# Patient Record
Sex: Female | Born: 1945
Health system: Southern US, Community
[De-identification: ages and names within clinical notes are randomized; demographics above are authoritative.]

## PROBLEM LIST (undated history)

## (undated) DIAGNOSIS — E2839 Other primary ovarian failure: Secondary | ICD-10-CM

## (undated) DIAGNOSIS — G4730 Sleep apnea, unspecified: Secondary | ICD-10-CM

## (undated) DIAGNOSIS — C50919 Malignant neoplasm of unspecified site of unspecified female breast: Secondary | ICD-10-CM

## (undated) DIAGNOSIS — M810 Age-related osteoporosis without current pathological fracture: Secondary | ICD-10-CM

## (undated) DIAGNOSIS — D249 Benign neoplasm of unspecified breast: Secondary | ICD-10-CM

## (undated) DIAGNOSIS — R609 Edema, unspecified: Secondary | ICD-10-CM

## (undated) DIAGNOSIS — G622 Polyneuropathy due to other toxic agents: Secondary | ICD-10-CM

## (undated) DIAGNOSIS — R0602 Shortness of breath: Secondary | ICD-10-CM

## (undated) DIAGNOSIS — N184 Chronic kidney disease, stage 4 (severe): Secondary | ICD-10-CM

## (undated) DIAGNOSIS — D649 Anemia, unspecified: Secondary | ICD-10-CM

## (undated) DIAGNOSIS — E119 Type 2 diabetes mellitus without complications: Secondary | ICD-10-CM

## (undated) DIAGNOSIS — N200 Calculus of kidney: Secondary | ICD-10-CM

## (undated) DIAGNOSIS — M1990 Unspecified osteoarthritis, unspecified site: Secondary | ICD-10-CM

## (undated) DIAGNOSIS — Z923 Personal history of irradiation: Secondary | ICD-10-CM

## (undated) DIAGNOSIS — I10 Essential (primary) hypertension: Secondary | ICD-10-CM

## (undated) DIAGNOSIS — G4710 Hypersomnia, unspecified: Secondary | ICD-10-CM

## (undated) DIAGNOSIS — R079 Chest pain, unspecified: Secondary | ICD-10-CM

## (undated) DIAGNOSIS — E785 Hyperlipidemia, unspecified: Secondary | ICD-10-CM

## (undated) DIAGNOSIS — N189 Chronic kidney disease, unspecified: Secondary | ICD-10-CM

## (undated) DIAGNOSIS — E11319 Type 2 diabetes mellitus with unspecified diabetic retinopathy without macular edema: Secondary | ICD-10-CM

## (undated) DIAGNOSIS — R0600 Dyspnea, unspecified: Secondary | ICD-10-CM

## (undated) DIAGNOSIS — G629 Polyneuropathy, unspecified: Secondary | ICD-10-CM

## (undated) DIAGNOSIS — G619 Inflammatory polyneuropathy, unspecified: Secondary | ICD-10-CM

## (undated) DIAGNOSIS — M545 Low back pain, unspecified: Secondary | ICD-10-CM

## (undated) DIAGNOSIS — Z87442 Personal history of urinary calculi: Secondary | ICD-10-CM

## (undated) DIAGNOSIS — M129 Arthropathy, unspecified: Secondary | ICD-10-CM

## (undated) HISTORY — DX: Edema, unspecified: R60.9

## (undated) HISTORY — DX: Arthropathy, unspecified: M12.9

## (undated) HISTORY — DX: Other primary ovarian failure: E28.39

## (undated) HISTORY — DX: Hypersomnia, unspecified: G47.30

## (undated) HISTORY — DX: Inflammatory polyneuropathy, unspecified: G62.2

## (undated) HISTORY — DX: Shortness of breath: R06.02

## (undated) HISTORY — PX: LAPAROSCOPIC OOPHERECTOMY: SHX6507

## (undated) HISTORY — DX: Low back pain, unspecified: M54.50

## (undated) HISTORY — DX: Age-related osteoporosis without current pathological fracture: M81.0

## (undated) HISTORY — DX: Calculus of kidney: N20.0

## (undated) HISTORY — DX: Type 2 diabetes mellitus without complications: E11.9

## (undated) HISTORY — DX: Essential (primary) hypertension: I10

## (undated) HISTORY — DX: Low back pain: M54.5

## (undated) HISTORY — DX: Benign neoplasm of unspecified breast: D24.9

## (undated) HISTORY — DX: Inflammatory polyneuropathy, unspecified: G61.9

## (undated) HISTORY — DX: Type 2 diabetes mellitus with unspecified diabetic retinopathy without macular edema: E11.319

## (undated) HISTORY — DX: Hyperlipidemia, unspecified: E78.5

## (undated) HISTORY — PX: CATARACT EXTRACTION: SUR2

## (undated) HISTORY — DX: Unspecified osteoarthritis, unspecified site: M19.90

## (undated) HISTORY — PX: CHOLECYSTECTOMY: SHX55

## (undated) HISTORY — DX: Hypersomnia, unspecified: G47.10

## (undated) HISTORY — PX: TUBAL LIGATION: SHX77

## (undated) HISTORY — PX: EYE SURGERY: SHX253

## (undated) HISTORY — DX: Sleep apnea, unspecified: G47.10

## (undated) HISTORY — PX: BREAST DUCTAL SYSTEM EXCISION: SHX5242

## (undated) HISTORY — PX: TONSILLECTOMY: SUR1361

## (undated) HISTORY — PX: BILATERAL SALPINGECTOMY: SHX5743

## (undated) HISTORY — DX: Chest pain, unspecified: R07.9

---

## 2004-03-08 ENCOUNTER — Ambulatory Visit: Payer: Self-pay

## 2004-11-23 ENCOUNTER — Ambulatory Visit: Payer: Self-pay | Admitting: Internal Medicine

## 2004-12-21 ENCOUNTER — Ambulatory Visit: Payer: Self-pay | Admitting: Internal Medicine

## 2005-01-29 ENCOUNTER — Ambulatory Visit: Payer: Self-pay | Admitting: Internal Medicine

## 2005-02-20 ENCOUNTER — Ambulatory Visit: Payer: Self-pay | Admitting: Internal Medicine

## 2006-01-02 ENCOUNTER — Ambulatory Visit: Payer: Self-pay | Admitting: Internal Medicine

## 2007-04-28 ENCOUNTER — Ambulatory Visit: Payer: Self-pay | Admitting: Gastroenterology

## 2007-10-22 ENCOUNTER — Ambulatory Visit: Payer: Self-pay | Admitting: Internal Medicine

## 2007-11-04 ENCOUNTER — Ambulatory Visit: Payer: Self-pay | Admitting: Internal Medicine

## 2008-07-01 ENCOUNTER — Ambulatory Visit: Payer: Self-pay | Admitting: Otolaryngology

## 2009-02-24 ENCOUNTER — Ambulatory Visit: Payer: Self-pay | Admitting: Internal Medicine

## 2010-03-14 ENCOUNTER — Ambulatory Visit: Payer: Self-pay | Admitting: Internal Medicine

## 2010-05-07 ENCOUNTER — Ambulatory Visit: Payer: Self-pay | Admitting: Ophthalmology

## 2010-05-14 ENCOUNTER — Ambulatory Visit: Payer: Self-pay | Admitting: Ophthalmology

## 2010-06-04 ENCOUNTER — Ambulatory Visit: Payer: Self-pay | Admitting: Ophthalmology

## 2010-10-29 ENCOUNTER — Ambulatory Visit: Payer: Self-pay | Admitting: Internal Medicine

## 2011-05-15 ENCOUNTER — Ambulatory Visit: Payer: Self-pay | Admitting: Internal Medicine

## 2011-07-23 ENCOUNTER — Ambulatory Visit: Payer: Self-pay | Admitting: Surgery

## 2011-07-23 LAB — CBC WITH DIFFERENTIAL/PLATELET
Basophil %: 0.3 %
Eosinophil #: 0.2 10*3/uL (ref 0.0–0.7)
Eosinophil %: 3.6 %
HGB: 12.2 g/dL (ref 12.0–16.0)
Lymphocyte %: 38 %
MCHC: 32.9 g/dL (ref 32.0–36.0)
MCV: 89 fL (ref 80–100)
Monocyte %: 8.7 %
Neutrophil #: 3.4 10*3/uL (ref 1.4–6.5)
Platelet: 163 10*3/uL (ref 150–440)
RBC: 4.14 10*6/uL (ref 3.80–5.20)

## 2011-07-23 LAB — COMPREHENSIVE METABOLIC PANEL
Anion Gap: 5 — ABNORMAL LOW (ref 7–16)
BUN: 20 mg/dL — ABNORMAL HIGH (ref 7–18)
Bilirubin,Total: 0.4 mg/dL (ref 0.2–1.0)
Calcium, Total: 10 mg/dL (ref 8.5–10.1)
Chloride: 101 mmol/L (ref 98–107)
Co2: 33 mmol/L — ABNORMAL HIGH (ref 21–32)
Glucose: 126 mg/dL — ABNORMAL HIGH (ref 65–99)
Osmolality: 282 (ref 275–301)
Potassium: 4.5 mmol/L (ref 3.5–5.1)
Sodium: 139 mmol/L (ref 136–145)
Total Protein: 7.7 g/dL (ref 6.4–8.2)

## 2011-07-30 ENCOUNTER — Ambulatory Visit: Payer: Self-pay | Admitting: Surgery

## 2012-04-22 HISTORY — PX: BREAST EXCISIONAL BIOPSY: SUR124

## 2012-04-22 HISTORY — PX: BREAST BIOPSY: SHX20

## 2012-07-22 ENCOUNTER — Ambulatory Visit: Payer: Self-pay

## 2012-08-26 ENCOUNTER — Ambulatory Visit: Payer: Self-pay

## 2012-10-21 ENCOUNTER — Ambulatory Visit: Payer: Self-pay | Admitting: Gastroenterology

## 2012-11-11 ENCOUNTER — Ambulatory Visit: Payer: Self-pay

## 2013-02-11 ENCOUNTER — Ambulatory Visit: Payer: Self-pay

## 2013-02-17 ENCOUNTER — Ambulatory Visit: Payer: Self-pay

## 2013-02-22 ENCOUNTER — Ambulatory Visit: Payer: Self-pay

## 2013-03-08 ENCOUNTER — Ambulatory Visit: Payer: Self-pay | Admitting: Surgery

## 2013-03-08 DIAGNOSIS — I10 Essential (primary) hypertension: Secondary | ICD-10-CM

## 2013-03-08 LAB — BASIC METABOLIC PANEL
Anion Gap: 5 — ABNORMAL LOW (ref 7–16)
Calcium, Total: 9.7 mg/dL (ref 8.5–10.1)
Chloride: 106 mmol/L (ref 98–107)
Creatinine: 1.47 mg/dL — ABNORMAL HIGH (ref 0.60–1.30)
EGFR (Non-African Amer.): 37 — ABNORMAL LOW
Glucose: 170 mg/dL — ABNORMAL HIGH (ref 65–99)
Osmolality: 290 (ref 275–301)
Potassium: 4.1 mmol/L (ref 3.5–5.1)

## 2013-03-08 LAB — CBC
HGB: 11.6 g/dL — ABNORMAL LOW (ref 12.0–16.0)
MCV: 87 fL (ref 80–100)
RBC: 4.01 10*6/uL (ref 3.80–5.20)
RDW: 16.7 % — ABNORMAL HIGH (ref 11.5–14.5)

## 2013-03-15 ENCOUNTER — Ambulatory Visit: Payer: Self-pay | Admitting: Surgery

## 2013-03-24 LAB — PATHOLOGY REPORT

## 2013-07-23 ENCOUNTER — Ambulatory Visit: Payer: Self-pay | Admitting: Surgery

## 2014-04-25 DIAGNOSIS — E11329 Type 2 diabetes mellitus with mild nonproliferative diabetic retinopathy without macular edema: Secondary | ICD-10-CM | POA: Diagnosis not present

## 2014-07-18 DIAGNOSIS — E559 Vitamin D deficiency, unspecified: Secondary | ICD-10-CM | POA: Diagnosis not present

## 2014-07-18 DIAGNOSIS — I10 Essential (primary) hypertension: Secondary | ICD-10-CM | POA: Diagnosis not present

## 2014-07-18 DIAGNOSIS — E782 Mixed hyperlipidemia: Secondary | ICD-10-CM | POA: Diagnosis not present

## 2014-07-18 DIAGNOSIS — Z0001 Encounter for general adult medical examination with abnormal findings: Secondary | ICD-10-CM | POA: Diagnosis not present

## 2014-07-18 DIAGNOSIS — E1140 Type 2 diabetes mellitus with diabetic neuropathy, unspecified: Secondary | ICD-10-CM | POA: Diagnosis not present

## 2014-07-18 DIAGNOSIS — E1165 Type 2 diabetes mellitus with hyperglycemia: Secondary | ICD-10-CM | POA: Diagnosis not present

## 2014-08-01 ENCOUNTER — Ambulatory Visit
Admit: 2014-08-01 | Disposition: A | Discharge status: Home / Self Care | Payer: Self-pay | Attending: Nurse Practitioner | Admitting: Nurse Practitioner

## 2014-08-01 DIAGNOSIS — M8580 Other specified disorders of bone density and structure, unspecified site: Secondary | ICD-10-CM | POA: Diagnosis not present

## 2014-08-01 DIAGNOSIS — Z1231 Encounter for screening mammogram for malignant neoplasm of breast: Secondary | ICD-10-CM | POA: Diagnosis not present

## 2014-08-01 DIAGNOSIS — M8589 Other specified disorders of bone density and structure, multiple sites: Secondary | ICD-10-CM | POA: Diagnosis not present

## 2014-08-01 DIAGNOSIS — E2839 Other primary ovarian failure: Secondary | ICD-10-CM | POA: Diagnosis not present

## 2014-08-04 DIAGNOSIS — E1165 Type 2 diabetes mellitus with hyperglycemia: Secondary | ICD-10-CM | POA: Diagnosis not present

## 2014-08-04 DIAGNOSIS — E782 Mixed hyperlipidemia: Secondary | ICD-10-CM | POA: Diagnosis not present

## 2014-08-04 DIAGNOSIS — Z6837 Body mass index (BMI) 37.0-37.9, adult: Secondary | ICD-10-CM | POA: Diagnosis not present

## 2014-08-04 DIAGNOSIS — I10 Essential (primary) hypertension: Secondary | ICD-10-CM | POA: Diagnosis not present

## 2014-08-12 NOTE — Op Note (Signed)
PATIENT NAME:  Kara [name], SEDGWICK MR#:  [id number] DATE OF BIRTH:  [birth year]  DATE OF PROCEDURE:  03/15/2013  PREOPERATIVE DIAGNOSIS: Two right breast masses.   POSTOPERATIVE DIAGNOSIS: Two right breast masses.   PROCEDURE: Excision of 2 right breast masses.   SURGEON: Rochel Brome, MD  ANESTHESIA: General.   INDICATIONS: This 69 year old female has a history of discharge from the right nipple dating back to July. She has had ductal gland demonstrating a small nodule about 10 mm lateral to the nipple and about 12 mm deep. She also had a cluster of calcifications in the lateral aspect of the right breast deeper in the tissue. She had stereotactic biopsy, finding of papilloma with fibrocystic changes and calcifications. She was advised to have preoperative x-ray needle localization and excision of the deeper mass. I also recommended insertion of lacrimal duct probe to identify the duct and excise the mass which was more superficial.   The patient did have preoperative insertion of Kopans wire using radiographic guidance. I reviewed these mammogram images demonstrating location of the Kopans wire and the biopsy site.   DESCRIPTION OF PROCEDURE: The patient was placed on the operating table in the supine position under general anesthesia. The dressing was removed from the right breast exposing the Kopans wire, which were entered the breast at the 9 o'clock position some 4 cm lateral to the border of the areola. This wire was cut 2 cm from the skin. The right breast was prepared with ChloraPrep, draped in a sterile manner.   A curvilinear incision was made from approximately 8 o'clock to 10 o'clock position about a centimeter lateral to the border of the areola and carried down through subcutaneous tissues. Next, the nipple was squeezed demonstrating drainage from the central aspect of the nipple. A lacrimal duct probe was inserted and went into a duct at the 9 o'clock position. The dissection was carried  down to the lacrimal duct probe and removed tissue surrounding the probe over a distance of about 2 cm. It was submitted fresh for routine pathology and labeled as superficial nodule at 9 o'clock position.   Further dissection was carried out down to the Kopans wire and removed a portion of tissue surrounding the thick portion of the wire. There were 2 arterial bleeding points which were suture ligated with 4-0 chromic and resolved. This portion of tissue was approximately 2 x 2 x 2 cm in dimension surrounding the thick portion of the wire and was submitted for specimen mammogram and pathology, labeled as the deeper nodule at 9 o'clock position.   The wound was inspected. Several small bleeding points were cauterized. Hemostasis was subsequently intact. Deeper tissues surrounding cautery artifact were infiltrated with 0.5% Sensorcaine with epinephrine. Also subcutaneous tissues were infiltrated using a total of 7 mL. Next, the subcutaneous tissues were closed with interrupted 4-0 chromic. The skin was closed with running 4-0 Monocryl subcuticular suture and Dermabond. The patient tolerated the surgery satisfactorily and was then prepared for transfer to the recovery room. ____________________________ Lenna Sciara. Rochel Brome, MD jws:sb D: 03/15/2013 11:24:45 ET T: 03/15/2013 11:41:08 ET JOB#: [id number]  cc: Loreli Dollar, MD, <Dictator> Loreli Dollar MD ELECTRONICALLY SIGNED 03/16/2013 18:49

## 2014-08-14 NOTE — Op Note (Signed)
PATIENT NAME:  Kara [name], Kara [name] MR#:  [id number] DATE OF BIRTH:  04/30/1945  DATE OF PROCEDURE:  07/30/2011  PREOPERATIVE DIAGNOSIS: Chronic cholecystitis, cholelithiasis.   POSTOPERATIVE DIAGNOSIS: Chronic cholecystitis, cholelithiasis.   PROCEDURE: Laparoscopic cholecystectomy, cholangiogram.   SURGEON: Loreli Dollar, MD   ANESTHESIA: General.   INDICATIONS: This 69 year old female has a history of intermittent right upper quadrant abdominal pains radiating through to the subscapular region of the back, duration of some 5 to 6 months. She had ultrasound findings of gallstones, also has diabetes and morbid obesity. Had previous lower abdominal laparotomy to remove her tubes and one ovary.   PROCEDURE: The patient was placed on the operating table in the supine position under general endotracheal anesthesia. The abdomen was prepared with ChloraPrep and draped in a sterile manner.   Initial palpation revealed there was firmness at the umbilicus consistent with incarcerated umbilical hernia which appeared to contain fatty tissue. The incision was made in the supraumbilical area, longitudinally oriented 12 mm in length, carried down through subcutaneous tissues to the deep fascia. The fascia was grasped with a laryngeal hook and elevated. A Veress needle was inserted, aspirated, and irrigated with a saline solution. Next, the peritoneal cavity was inflated with carbon dioxide. The Veress needle was removed. The 10 mm cannula was inserted. The 10 mm 0 degree laparoscope was inserted to view the peritoneal cavity. The patient was placed in reverse Trendelenburg position and turned 5 degrees to the left. The liver appeared normal. Another incision was made in the epigastrium right of the midline to introduce an 11 mm cannula. Two incisions were made in the lateral aspect of the right upper quadrant to introduce two 5-mm cannulas.   The gallbladder was retracted towards the right shoulder. It appeared to  have a slightly thickened wall. The infundibulum was retracted inferiorly and laterally. The porta hepatis was demonstrated. The neck of the gallbladder was mobilized with incision of the visceral peritoneum. The cystic duct was dissected free from surrounding structures. The cystic artery was dissected free from surrounding structures. A critical view of safety was demonstrated. An Endoclip was placed across the cystic duct adjacent to the neck of the gallbladder. An incision was made in the cystic duct to introduce a Reddick catheter. Half-strength Conray-60 dye was injected. The cholangiogram was done with fluoroscopy viewing the biliary tree and prompt flow of dye into the duodenum. No retained calculi were seen. Three images were saved. The Reddick catheter was removed. The cystic duct was doubly ligated with endoclips and divided. The cystic artery was controlled with a single Endoclip and divided. The gallbladder was dissected free from the liver with hook and cautery. Bleeding was very scant. Hemostasis was subsequently intact. Gallbladder was delivered up through the infraumbilical incision, opened, suctioned, removed, and submitted in formalin with palpable stones for routine pathology. It is noted that there was evidence of some omentum incarcerated in a small umbilical hernia. There was some bleeding from the supraumbilical port site which is controlled using 0 Vicryl simple suture placing this in with an Endoclose instrument and tying it and voiding resolved. A small amount of blood was aspirated. Hemostasis was subsequently intact. The right upper quadrant was further inspected. Hemostasis was intact. The cannulas were removed. Carbon dioxide was allowed to escape from the peritoneal cavity. Skin incisions were infiltrated with 0.5% Sensorcaine with epinephrine. The supraumbilical incision was closed with interrupted 4-0 chromic subcuticular sutures. The other three incisions were closed with  interrupted 5-0 chromic subcuticular  sutures. All dressings were further reinforced with benzoin, Steri-Strips, and dressed with cotton gauze and paper tape.   The patient tolerated surgery satisfactorily and was then prepared for transfer to the recovery room.   ____________________________ Lenna Sciara. Rochel Brome, MD jws:drc D: 07/30/2011 09:01:40 ET T: 07/30/2011 10:28:00 ET JOB#: [id number]  cc: Loreli Dollar, MD, <Dictator> Loreli Dollar MD ELECTRONICALLY SIGNED 07/30/2011 18:15

## 2014-08-25 DIAGNOSIS — E1122 Type 2 diabetes mellitus with diabetic chronic kidney disease: Secondary | ICD-10-CM | POA: Diagnosis not present

## 2014-09-06 DIAGNOSIS — E1140 Type 2 diabetes mellitus with diabetic neuropathy, unspecified: Secondary | ICD-10-CM | POA: Diagnosis not present

## 2014-09-06 DIAGNOSIS — N200 Calculus of kidney: Secondary | ICD-10-CM | POA: Diagnosis not present

## 2014-09-06 DIAGNOSIS — R944 Abnormal results of kidney function studies: Secondary | ICD-10-CM | POA: Diagnosis not present

## 2014-09-06 DIAGNOSIS — I10 Essential (primary) hypertension: Secondary | ICD-10-CM | POA: Diagnosis not present

## 2014-09-06 DIAGNOSIS — R609 Edema, unspecified: Secondary | ICD-10-CM | POA: Diagnosis not present

## 2014-09-20 ENCOUNTER — Ambulatory Visit
Admission: RE | Admit: 2014-09-20 | Discharge: 2014-09-20 | Disposition: A | Discharge status: Home / Self Care | Payer: Commercial Managed Care - HMO | Source: Ambulatory Visit | Attending: Urology | Admitting: Urology

## 2014-09-20 ENCOUNTER — Encounter
Admission: RE | Admit: 2014-09-20 | Discharge: 2014-09-20 | Disposition: A | Discharge status: Home / Self Care | Payer: Commercial Managed Care - HMO | Source: Ambulatory Visit | Attending: Urology | Admitting: Urology

## 2014-09-20 ENCOUNTER — Other Ambulatory Visit: Payer: Self-pay | Admitting: Urology

## 2014-09-20 DIAGNOSIS — N200 Calculus of kidney: Secondary | ICD-10-CM | POA: Diagnosis not present

## 2014-09-20 DIAGNOSIS — N209 Urinary calculus, unspecified: Secondary | ICD-10-CM | POA: Diagnosis not present

## 2014-09-20 DIAGNOSIS — I10 Essential (primary) hypertension: Secondary | ICD-10-CM | POA: Diagnosis not present

## 2014-09-29 ENCOUNTER — Telehealth: Payer: Self-pay | Admitting: Urology

## 2014-09-29 ENCOUNTER — Encounter
Admission: RE | Disposition: A | Discharge status: Home / Self Care | Payer: Self-pay | Source: Ambulatory Visit | Attending: Urology

## 2014-09-29 ENCOUNTER — Ambulatory Visit: Payer: Commercial Managed Care - HMO

## 2014-09-29 ENCOUNTER — Ambulatory Visit
Admission: RE | Admit: 2014-09-29 | Discharge: 2014-09-29 | Disposition: A | Discharge status: Home / Self Care | Payer: Commercial Managed Care - HMO | Source: Ambulatory Visit | Attending: Urology | Admitting: Urology

## 2014-09-29 DIAGNOSIS — N200 Calculus of kidney: Secondary | ICD-10-CM | POA: Diagnosis not present

## 2014-09-29 DIAGNOSIS — E119 Type 2 diabetes mellitus without complications: Secondary | ICD-10-CM | POA: Insufficient documentation

## 2014-09-29 DIAGNOSIS — I10 Essential (primary) hypertension: Secondary | ICD-10-CM | POA: Diagnosis not present

## 2014-09-29 DIAGNOSIS — E669 Obesity, unspecified: Secondary | ICD-10-CM | POA: Insufficient documentation

## 2014-09-29 DIAGNOSIS — N201 Calculus of ureter: Secondary | ICD-10-CM | POA: Diagnosis not present

## 2014-09-29 HISTORY — PX: EXTRACORPOREAL SHOCK WAVE LITHOTRIPSY: SHX1557

## 2014-09-29 LAB — GLUCOSE, CAPILLARY: Glucose-Capillary: 158 mg/dL — ABNORMAL HIGH (ref 65–99)

## 2014-09-29 SURGERY — LITHOTRIPSY, ESWL
Anesthesia: Moderate Sedation | Laterality: Left

## 2014-09-29 MED ORDER — DIAZEPAM 5 MG PO TABS
10.0000 mg | ORAL_TABLET | Freq: Once | ORAL | Status: AC
  Administered 2014-09-29: 10 mg via ORAL

## 2014-09-29 MED ORDER — DIAZEPAM 5 MG PO TABS
ORAL_TABLET | ORAL | Status: AC
  Administered 2014-09-29: 10 mg via ORAL
  Filled 2014-09-29: qty 2

## 2014-09-29 MED ORDER — ONDANSETRON HCL 4 MG/2ML IJ SOLN
INTRAMUSCULAR | Status: AC
  Administered 2014-09-29: 4 mg via INTRAVENOUS
  Filled 2014-09-29: qty 2

## 2014-09-29 MED ORDER — DEXTROSE-NACL 5-0.45 % IV SOLN
INTRAVENOUS | Status: DC
  Administered 2014-09-29: 07:00:00 via INTRAVENOUS

## 2014-09-29 MED ORDER — CIPROFLOXACIN HCL 500 MG PO TABS
500.0000 mg | ORAL_TABLET | Freq: Once | ORAL | Status: AC
  Administered 2014-09-29: 500 mg via ORAL

## 2014-09-29 MED ORDER — DIPHENHYDRAMINE HCL 25 MG PO CAPS
25.0000 mg | ORAL_CAPSULE | ORAL | Status: AC
  Administered 2014-09-29: 25 mg via ORAL

## 2014-09-29 MED ORDER — DIPHENHYDRAMINE HCL 25 MG PO CAPS
ORAL_CAPSULE | ORAL | Status: AC
  Administered 2014-09-29: 25 mg via ORAL
  Filled 2014-09-29: qty 1

## 2014-09-29 MED ORDER — ONDANSETRON HCL 4 MG/2ML IJ SOLN
4.0000 mg | Freq: Once | INTRAMUSCULAR | Status: AC
Start: 2014-09-29 — End: 2014-09-29
  Administered 2014-09-29: 4 mg via INTRAVENOUS

## 2014-09-29 MED ORDER — CIPROFLOXACIN HCL 500 MG PO TABS
ORAL_TABLET | ORAL | Status: AC
  Administered 2014-09-29: 500 mg via ORAL
  Filled 2014-09-29: qty 1

## 2014-09-29 NOTE — H&P (Signed)
See paper H&P 

## 2014-09-29 NOTE — Telephone Encounter (Signed)
Patient was scheduled for ESWL this morning/SW

## 2014-09-29 NOTE — Telephone Encounter (Signed)
Patient had a KUB and no stone was visible.  Did she has ESWL?

## 2014-09-29 NOTE — Discharge Instructions (Signed)
No procedure done

## 2014-11-14 DIAGNOSIS — E782 Mixed hyperlipidemia: Secondary | ICD-10-CM | POA: Diagnosis not present

## 2014-11-14 DIAGNOSIS — E1165 Type 2 diabetes mellitus with hyperglycemia: Secondary | ICD-10-CM | POA: Diagnosis not present

## 2014-11-14 DIAGNOSIS — R609 Edema, unspecified: Secondary | ICD-10-CM | POA: Diagnosis not present

## 2014-11-14 DIAGNOSIS — N200 Calculus of kidney: Secondary | ICD-10-CM | POA: Diagnosis not present

## 2014-11-14 DIAGNOSIS — I10 Essential (primary) hypertension: Secondary | ICD-10-CM | POA: Diagnosis not present

## 2014-12-06 DIAGNOSIS — R944 Abnormal results of kidney function studies: Secondary | ICD-10-CM | POA: Diagnosis not present

## 2014-12-30 ENCOUNTER — Encounter: Payer: Self-pay | Admitting: Urology

## 2015-02-14 DIAGNOSIS — E782 Mixed hyperlipidemia: Secondary | ICD-10-CM | POA: Diagnosis not present

## 2015-02-14 DIAGNOSIS — E1140 Type 2 diabetes mellitus with diabetic neuropathy, unspecified: Secondary | ICD-10-CM | POA: Diagnosis not present

## 2015-02-14 DIAGNOSIS — R944 Abnormal results of kidney function studies: Secondary | ICD-10-CM | POA: Diagnosis not present

## 2015-02-14 DIAGNOSIS — M545 Low back pain: Secondary | ICD-10-CM | POA: Diagnosis not present

## 2015-02-14 DIAGNOSIS — I10 Essential (primary) hypertension: Secondary | ICD-10-CM | POA: Diagnosis not present

## 2015-03-30 DIAGNOSIS — E119 Type 2 diabetes mellitus without complications: Secondary | ICD-10-CM | POA: Diagnosis not present

## 2015-03-30 DIAGNOSIS — R944 Abnormal results of kidney function studies: Secondary | ICD-10-CM | POA: Diagnosis not present

## 2015-04-07 ENCOUNTER — Ambulatory Visit
Admission: RE | Admit: 2015-04-07 | Discharge: 2015-04-07 | Disposition: A | Discharge status: Home / Self Care | Payer: Commercial Managed Care - HMO | Source: Ambulatory Visit | Attending: Urology | Admitting: Urology

## 2015-04-07 ENCOUNTER — Encounter: Payer: Self-pay | Admitting: Urology

## 2015-04-07 ENCOUNTER — Ambulatory Visit (INDEPENDENT_AMBULATORY_CARE_PROVIDER_SITE_OTHER): Payer: Commercial Managed Care - HMO | Admitting: Urology

## 2015-04-07 VITALS — BP 186/86 | HR 102 | Temp 98.7°F | Ht 64.0 in | Wt 221.6 lb

## 2015-04-07 DIAGNOSIS — K5732 Diverticulitis of large intestine without perforation or abscess without bleeding: Secondary | ICD-10-CM | POA: Insufficient documentation

## 2015-04-07 DIAGNOSIS — Z87442 Personal history of urinary calculi: Secondary | ICD-10-CM | POA: Insufficient documentation

## 2015-04-07 DIAGNOSIS — R1032 Left lower quadrant pain: Secondary | ICD-10-CM | POA: Insufficient documentation

## 2015-04-07 DIAGNOSIS — R918 Other nonspecific abnormal finding of lung field: Secondary | ICD-10-CM | POA: Insufficient documentation

## 2015-04-07 DIAGNOSIS — N200 Calculus of kidney: Secondary | ICD-10-CM | POA: Diagnosis not present

## 2015-04-07 DIAGNOSIS — N202 Calculus of kidney with calculus of ureter: Secondary | ICD-10-CM | POA: Insufficient documentation

## 2015-04-07 DIAGNOSIS — N132 Hydronephrosis with renal and ureteral calculous obstruction: Secondary | ICD-10-CM | POA: Insufficient documentation

## 2015-04-07 LAB — URINALYSIS, COMPLETE
BILIRUBIN UA: NEGATIVE
Glucose, UA: NEGATIVE
Ketones, UA: NEGATIVE
Nitrite, UA: NEGATIVE
PH UA: 5.5 (ref 5.0–7.5)
Specific Gravity, UA: 1.015 (ref 1.005–1.030)
Urobilinogen, Ur: 0.2 mg/dL (ref 0.2–1.0)

## 2015-04-07 LAB — MICROSCOPIC EXAMINATION

## 2015-04-07 MED ORDER — TAMSULOSIN HCL 0.4 MG PO CAPS: 0.4000 mg | ORAL_CAPSULE | Freq: Every day | ORAL | Status: DC

## 2015-04-07 MED ORDER — HYDROCODONE-ACETAMINOPHEN 5-325 MG PO TABS: 1.0000 | ORAL_TABLET | Freq: Four times a day (QID) | ORAL | Status: DC | PRN

## 2015-04-07 NOTE — Progress Notes (Signed)
04/07/2015 3:52 PM   Kara [name] 1946-02-02 [id number]  Referring provider: Lavera Guise, MD [address] [place], [place] [address]  Chief Complaint  Patient presents with  . Nephrolithiasis    HPI: 69 yo with history of nephrolithiasis who presents today to the office with onset of LLQ pain x 3 days, nausea, vomiting.  She is concerned that she may be passing another stone. The pain comes and goes.  +chills but no fevers.   No dysuria or gross hematuria.    She has only passed on other stone just this past spring.   She was scheduled for ESWL by Dr. Elnoria Howard, however, the stone was unable to be visualized at that time and she presumably passed it spontaneously.  Prior to 3 days ago, she did not have any flank pain or any other symptoms consistent with stones.   PMH: Past Medical History  Diagnosis Date  . Lumbago   . Hyperlipemia   . Osteoarthrosis   . SOB (shortness of breath)   . Cough variant asthma   . Edema   . Ovarian failure   . Hypersomnia with sleep apnea   . 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 Arthropathy   . Diabetic retinopathy ([place])   . Hypertension   . Chest pain   . Benign neoplasm of breast   . Diabetes ([place])   . Nephrolithiasis   . Kidney stones, calcium oxalate     Surgical History: Past Surgical History  Procedure Laterality Date  . Extracorporeal shock wave lithotripsy Left 09/29/2014    Procedure: EXTRACORPOREAL SHOCK WAVE LITHOTRIPSY (ESWL);  Surgeon: Hollice Espy, MD;  Location: [facility] ORS;  Service: Urology;  Laterality: Left;  . Laparoscopic oopherectomy Left   . Tonsillectomy    . Cholecystectomy    . Cataract extraction    . Breast ductal system excision      Home Medications:    Medication List       This list is accurate as of: 04/07/15  3:52 PM.  Always use your most recent med list.               aspirin EC 81 MG tablet  Take 81 mg by mouth daily. Reported on 04/07/2015     calcium citrate-vitamin D  315-200 MG-UNIT tablet  Commonly known as:  CITRACAL+D  Take 1 tablet by mouth daily.     cetirizine 10 MG tablet  Commonly known as:  ZYRTEC  Take 10 mg by mouth as needed for allergies.     cholecalciferol 1000 UNITS tablet  Commonly known as:  VITAMIN D  Take 1,000 Units by mouth daily.     furosemide 10 MG/ML solution  Commonly known as:  LASIX  Take 10 mg by mouth daily as needed.     glimepiride 4 MG tablet  Commonly known as:  AMARYL  Take 4 mg by mouth 2 (two) times daily.     hydrALAZINE 10 MG tablet  Commonly known as:  APRESOLINE  Take 10 mg by mouth 3 (three) times daily.     HYDROcodone-acetaminophen 5-325 MG tablet  Commonly known as:  NORCO/VICODIN  Take 1-2 tablets by mouth every 6 (six) hours as needed for moderate pain.     insulin detemir 100 UNIT/ML injection  Commonly known as:  LEVEMIR  Inject 75 Units into the skin daily after supper.     loperamide 2 MG tablet  Commonly known as:  IMODIUM A-D  Take 2 mg  by mouth as needed for diarrhea or loose stools.     losartan 25 MG tablet  Commonly known as:  COZAAR  Take 25 mg by mouth daily.     pregabalin 100 MG capsule  Commonly known as:  LYRICA  Take 100 mg by mouth 2 (two) times daily.     rosuvastatin 20 MG tablet  Commonly known as:  CRESTOR  Take 20 mg by mouth daily.     tamsulosin 0.4 MG Caps capsule  Commonly known as:  FLOMAX  Take 1 capsule (0.4 mg total) by mouth daily.     traMADol-acetaminophen 37.5-325 MG tablet  Commonly known as:  ULTRACET  Take 1 tablet by mouth every evening.     verapamil 40 MG tablet  Commonly known as:  CALAN  Take 40 mg by mouth 2 (two) times daily.        Allergies: No Known Allergies  Family History: Family History  Problem Relation Age of Onset  . Prostate cancer Neg Hx   . Bladder Cancer Neg Hx   . Kidney cancer Neg Hx     Social History:  reports that she has quit smoking. She does not have any smokeless tobacco history on file. She  reports that she does not drink alcohol or use illicit drugs.  ROS: UROLOGY Frequent Urination?: No Hard to postpone urination?: No Burning/pain with urination?: No Get up at night to urinate?: No Leakage of urine?: No Urine stream starts and stops?: No Trouble starting stream?: Yes Do you have to strain to urinate?: Yes Blood in urine?: No Urinary tract infection?: No Sexually transmitted disease?: No Injury to kidneys or bladder?: No Painful intercourse?: No Weak stream?: Yes Currently pregnant?: No Vaginal bleeding?: No Last menstrual period?: n  Gastrointestinal Nausea?: Yes Vomiting?: No Indigestion/heartburn?: No Diarrhea?: No Constipation?: No  Constitutional Fever: No Night sweats?: No Weight loss?: No Fatigue?: Yes  Skin Skin rash/lesions?: No Itching?: No  Eyes Blurred vision?: No Double vision?: No  Ears/Nose/Throat Sore throat?: No Sinus problems?: No  Hematologic/Lymphatic Swollen glands?: No Easy bruising?: Yes  Cardiovascular Leg swelling?: No Chest pain?: No  Respiratory Cough?: No Shortness of breath?: Yes  Endocrine Excessive thirst?: No  Musculoskeletal Back pain?: Yes Joint pain?: No  Neurological Headaches?: No Dizziness?: Yes  Psychologic Depression?: No Anxiety?: No  Physical Exam: BP 186/86 mmHg  Pulse 102  Temp(Src) 98.7 F (37.1 C) (Oral)  Ht 5\' 4"  (1.626 m)  Wt 221 lb 9.6 oz (100.517 kg)  BMI 38.02 kg/m2  T 97.7 Constitutional:  Alert and oriented, No acute distress. HEENT: [place] AT, moist mucus membranes.  Trachea midline, no masses. Cardiovascular: No clubbing, cyanosis, or edema. Respiratory: Normal respiratory effort, no increased work of breathing. GI: Abdomen is soft, nondistended, no abdominal masses.  Tenderness to deep palpation.   GU: No CVA tenderness. Skin: No rashes, bruises or suspicious lesions. Neurologic: Grossly intact, no focal deficits, moving all 4 extremities. Psychiatric: Normal  mood and affect.  Laboratory Data: Lab Results  Component Value Date   WBC 5.7 03/08/2013   HGB 11.6* 03/08/2013   HCT 34.8* 03/08/2013   MCV 87 03/08/2013   PLT 145* 03/08/2013    Lab Results  Component Value Date   CREATININE 1.47* 03/08/2013    Urinalysis UA dip today shows 3+ blood, 1+ protein, 1+LE.  Micro shows 0-5 WBC, 3-10 RBC, few bacteria.    Pertinent Imaging: Stat CT abdomen pelvis ordered, pending this afternoon  Assessment & Plan:  69  yo F with history of kidney stones presenting with 3 day LLQ pain.  I suspect she is likely passing another stone. Given the questionable appearance of her stones in the past on KUB, I would like to proceed with a CT abdomen and pelvis today for definitive diagnosis. I will call her with the results of this as soon as I receive them. We will develop a plan he is on the size and location of the stone if present. In the interim, she was given Flomax as well as pain medication to use as needed. She was advised to present to the emergency room if her pain is unable to be controlled or she develops fevers or chills.  1. History of nephrolithiasis - Urinalysis, Complete - CT RENAL STONE STUDY  2. Left lower quadrant pain   Return for STAT CT scan, will call with results.  Hollice Espy, MD  [facility] [address] [place], [place] [address] [phone]

## 2015-04-10 ENCOUNTER — Ambulatory Visit
Admission: RE | Admit: 2015-04-10 | Discharge: 2015-04-10 | Disposition: A | Discharge status: Home / Self Care | Payer: Commercial Managed Care - HMO | Source: Ambulatory Visit | Attending: Urology | Admitting: Urology

## 2015-04-10 ENCOUNTER — Other Ambulatory Visit: Payer: Self-pay | Admitting: Radiology

## 2015-04-10 DIAGNOSIS — N200 Calculus of kidney: Secondary | ICD-10-CM

## 2015-04-11 ENCOUNTER — Telehealth: Payer: Self-pay | Admitting: Radiology

## 2015-04-11 ENCOUNTER — Encounter
Admission: RE | Admit: 2015-04-11 | Discharge: 2015-04-11 | Disposition: A | Discharge status: Home / Self Care | Payer: Commercial Managed Care - HMO | Source: Ambulatory Visit | Attending: Urology | Admitting: Urology

## 2015-04-11 DIAGNOSIS — R0602 Shortness of breath: Secondary | ICD-10-CM | POA: Diagnosis not present

## 2015-04-11 DIAGNOSIS — M129 Arthropathy, unspecified: Secondary | ICD-10-CM | POA: Diagnosis not present

## 2015-04-11 DIAGNOSIS — M545 Low back pain: Secondary | ICD-10-CM | POA: Diagnosis not present

## 2015-04-11 DIAGNOSIS — I10 Essential (primary) hypertension: Secondary | ICD-10-CM | POA: Diagnosis not present

## 2015-04-11 DIAGNOSIS — M1990 Unspecified osteoarthritis, unspecified site: Secondary | ICD-10-CM | POA: Diagnosis not present

## 2015-04-11 DIAGNOSIS — E1140 Type 2 diabetes mellitus with diabetic neuropathy, unspecified: Secondary | ICD-10-CM | POA: Diagnosis not present

## 2015-04-11 DIAGNOSIS — Z6837 Body mass index (BMI) 37.0-37.9, adult: Secondary | ICD-10-CM | POA: Diagnosis not present

## 2015-04-11 DIAGNOSIS — R1032 Left lower quadrant pain: Secondary | ICD-10-CM | POA: Diagnosis not present

## 2015-04-11 DIAGNOSIS — Z7982 Long term (current) use of aspirin: Secondary | ICD-10-CM | POA: Diagnosis not present

## 2015-04-11 DIAGNOSIS — Z9849 Cataract extraction status, unspecified eye: Secondary | ICD-10-CM | POA: Diagnosis not present

## 2015-04-11 DIAGNOSIS — G622 Polyneuropathy due to other toxic agents: Secondary | ICD-10-CM | POA: Diagnosis not present

## 2015-04-11 DIAGNOSIS — R112 Nausea with vomiting, unspecified: Secondary | ICD-10-CM | POA: Diagnosis not present

## 2015-04-11 DIAGNOSIS — Z794 Long term (current) use of insulin: Secondary | ICD-10-CM | POA: Diagnosis not present

## 2015-04-11 DIAGNOSIS — E785 Hyperlipidemia, unspecified: Secondary | ICD-10-CM | POA: Diagnosis not present

## 2015-04-11 DIAGNOSIS — R05 Cough: Secondary | ICD-10-CM | POA: Diagnosis not present

## 2015-04-11 DIAGNOSIS — E669 Obesity, unspecified: Secondary | ICD-10-CM | POA: Diagnosis not present

## 2015-04-11 DIAGNOSIS — N132 Hydronephrosis with renal and ureteral calculous obstruction: Secondary | ICD-10-CM | POA: Diagnosis not present

## 2015-04-11 DIAGNOSIS — Z9049 Acquired absence of other specified parts of digestive tract: Secondary | ICD-10-CM | POA: Diagnosis not present

## 2015-04-11 DIAGNOSIS — Z87891 Personal history of nicotine dependence: Secondary | ICD-10-CM | POA: Diagnosis not present

## 2015-04-11 DIAGNOSIS — E2839 Other primary ovarian failure: Secondary | ICD-10-CM | POA: Diagnosis not present

## 2015-04-11 DIAGNOSIS — G4730 Sleep apnea, unspecified: Secondary | ICD-10-CM | POA: Diagnosis not present

## 2015-04-11 DIAGNOSIS — Z79899 Other long term (current) drug therapy: Secondary | ICD-10-CM | POA: Diagnosis not present

## 2015-04-11 DIAGNOSIS — Z87442 Personal history of urinary calculi: Secondary | ICD-10-CM | POA: Diagnosis not present

## 2015-04-11 DIAGNOSIS — G4719 Other hypersomnia: Secondary | ICD-10-CM | POA: Diagnosis not present

## 2015-04-11 DIAGNOSIS — E11319 Type 2 diabetes mellitus with unspecified diabetic retinopathy without macular edema: Secondary | ICD-10-CM | POA: Diagnosis not present

## 2015-04-11 NOTE — Telephone Encounter (Signed)
Pt notified of surgery scheduled 04/14/15, pre-admit testing appt 12/20 @1 :15 and to call day prior to surgery for arrival time to SDS. Pt voices understanding & states she is not currently taking ASA 81mg .

## 2015-04-11 NOTE — Patient Instructions (Signed)
  Your procedure is scheduled on: Friday Dec. 23, 2016. Report to Same Day Surgery. To find out your arrival time please call [phone] between 1PM - 3PM on Thursday Dec. 22, 2016.  Remember: Instructions that are not followed completely may result in serious medical risk, up to and including death, or upon the discretion of your surgeon and anesthesiologist your surgery may need to be rescheduled.    _x___ 1. Do not eat food or drink liquids after midnight. No gum chewing or hard candies.     ____ 2. No Alcohol for 24 hours before or after surgery.   ____ 3. Bring all medications with you on the day of surgery if instructed.    __x__ 4. Notify your doctor if there is any change in your medical condition     (cold, fever, infections).     Do not wear jewelry, make-up, hairpins, clips or nail polish.  Do not wear lotions, powders, or perfumes. You may wear deodorant.  Do not shave 48 hours prior to surgery. Men may shave face and neck.  Do not bring valuables to the hospital.    [facility] is not responsible for any belongings or valuables.               Contacts, dentures or bridgework may not be worn into surgery.  Leave your suitcase in the car. After surgery it may be brought to your room.  For patients admitted to the hospital, discharge time is determined by your treatment team.   Patients discharged the day of surgery will not be allowed to drive home.    Please read over the following fact sheets that you were given:   [facility] Preparing for Surgery  __x__ Take these medicines the morning of surgery with A SIP OF WATER:    1. Lyrica  2. hydrALAZINE (APRESOLINE)  3. verapamil (CALAN)     ____ Fleet Enema (as directed)   ____ Use CHG Soap as directed  ____ Use inhalers on the day of surgery  ____ Stop metformin 2 days prior to surgery    _x___ Take 1/2 of usual insulin dose the night before surgery and none on the morning of  surgery.   ____ Already  stopped aspirin on Dec. 15, 2016  _x___ Stop Anti-inflammatories on does not apply.  OK to take tramadol or tylenol for pain.   _x___ Stop Vitamins until after surgery.    ____ Bring C-Pap to the hospital.

## 2015-04-13 ENCOUNTER — Encounter: Admission: RE | Payer: Self-pay | Source: Ambulatory Visit

## 2015-04-13 ENCOUNTER — Ambulatory Visit: Admission: RE | Admit: 2015-04-13 | Payer: Commercial Managed Care - HMO | Source: Ambulatory Visit | Admitting: Urology

## 2015-04-13 SURGERY — LITHOTRIPSY, ESWL
Anesthesia: Moderate Sedation | Laterality: Left

## 2015-04-14 ENCOUNTER — Ambulatory Visit
Admission: RE | Admit: 2015-04-14 | Discharge: 2015-04-14 | Disposition: A | Discharge status: Home / Self Care | Payer: Commercial Managed Care - HMO | Source: Ambulatory Visit | Attending: Urology | Admitting: Urology

## 2015-04-14 ENCOUNTER — Encounter: Payer: Self-pay | Admitting: Anesthesiology

## 2015-04-14 ENCOUNTER — Ambulatory Visit: Payer: Commercial Managed Care - HMO | Admitting: Anesthesiology

## 2015-04-14 ENCOUNTER — Encounter
Admission: RE | Disposition: A | Discharge status: Home / Self Care | Payer: Self-pay | Source: Ambulatory Visit | Attending: Urology

## 2015-04-14 DIAGNOSIS — E785 Hyperlipidemia, unspecified: Secondary | ICD-10-CM | POA: Insufficient documentation

## 2015-04-14 DIAGNOSIS — E2839 Other primary ovarian failure: Secondary | ICD-10-CM | POA: Insufficient documentation

## 2015-04-14 DIAGNOSIS — Z79899 Other long term (current) drug therapy: Secondary | ICD-10-CM | POA: Insufficient documentation

## 2015-04-14 DIAGNOSIS — M129 Arthropathy, unspecified: Secondary | ICD-10-CM | POA: Insufficient documentation

## 2015-04-14 DIAGNOSIS — Z9049 Acquired absence of other specified parts of digestive tract: Secondary | ICD-10-CM | POA: Insufficient documentation

## 2015-04-14 DIAGNOSIS — G4730 Sleep apnea, unspecified: Secondary | ICD-10-CM | POA: Insufficient documentation

## 2015-04-14 DIAGNOSIS — I10 Essential (primary) hypertension: Secondary | ICD-10-CM | POA: Diagnosis not present

## 2015-04-14 DIAGNOSIS — E669 Obesity, unspecified: Secondary | ICD-10-CM | POA: Insufficient documentation

## 2015-04-14 DIAGNOSIS — R1032 Left lower quadrant pain: Secondary | ICD-10-CM | POA: Insufficient documentation

## 2015-04-14 DIAGNOSIS — R112 Nausea with vomiting, unspecified: Secondary | ICD-10-CM | POA: Insufficient documentation

## 2015-04-14 DIAGNOSIS — R0602 Shortness of breath: Secondary | ICD-10-CM | POA: Diagnosis not present

## 2015-04-14 DIAGNOSIS — N200 Calculus of kidney: Secondary | ICD-10-CM | POA: Diagnosis not present

## 2015-04-14 DIAGNOSIS — M1990 Unspecified osteoarthritis, unspecified site: Secondary | ICD-10-CM | POA: Diagnosis not present

## 2015-04-14 DIAGNOSIS — N132 Hydronephrosis with renal and ureteral calculous obstruction: Secondary | ICD-10-CM | POA: Diagnosis not present

## 2015-04-14 DIAGNOSIS — Z9849 Cataract extraction status, unspecified eye: Secondary | ICD-10-CM | POA: Insufficient documentation

## 2015-04-14 DIAGNOSIS — Z6837 Body mass index (BMI) 37.0-37.9, adult: Secondary | ICD-10-CM | POA: Insufficient documentation

## 2015-04-14 DIAGNOSIS — Z87442 Personal history of urinary calculi: Secondary | ICD-10-CM | POA: Diagnosis not present

## 2015-04-14 DIAGNOSIS — E11319 Type 2 diabetes mellitus with unspecified diabetic retinopathy without macular edema: Secondary | ICD-10-CM | POA: Insufficient documentation

## 2015-04-14 DIAGNOSIS — N201 Calculus of ureter: Secondary | ICD-10-CM | POA: Diagnosis not present

## 2015-04-14 DIAGNOSIS — G622 Polyneuropathy due to other toxic agents: Secondary | ICD-10-CM | POA: Insufficient documentation

## 2015-04-14 DIAGNOSIS — Z7982 Long term (current) use of aspirin: Secondary | ICD-10-CM | POA: Insufficient documentation

## 2015-04-14 DIAGNOSIS — M545 Low back pain: Secondary | ICD-10-CM | POA: Diagnosis not present

## 2015-04-14 DIAGNOSIS — E119 Type 2 diabetes mellitus without complications: Secondary | ICD-10-CM | POA: Diagnosis not present

## 2015-04-14 DIAGNOSIS — G4719 Other hypersomnia: Secondary | ICD-10-CM | POA: Diagnosis not present

## 2015-04-14 DIAGNOSIS — Z794 Long term (current) use of insulin: Secondary | ICD-10-CM | POA: Insufficient documentation

## 2015-04-14 DIAGNOSIS — E1140 Type 2 diabetes mellitus with diabetic neuropathy, unspecified: Secondary | ICD-10-CM | POA: Insufficient documentation

## 2015-04-14 DIAGNOSIS — R05 Cough: Secondary | ICD-10-CM | POA: Insufficient documentation

## 2015-04-14 DIAGNOSIS — Z87891 Personal history of nicotine dependence: Secondary | ICD-10-CM | POA: Insufficient documentation

## 2015-04-14 HISTORY — PX: URETEROSCOPY WITH HOLMIUM LASER LITHOTRIPSY: SHX6645

## 2015-04-14 HISTORY — PX: CYSTOSCOPY WITH STENT PLACEMENT: SHX5790

## 2015-04-14 LAB — GLUCOSE, CAPILLARY
GLUCOSE-CAPILLARY: 133 mg/dL — AB (ref 65–99)
Glucose-Capillary: 115 mg/dL — ABNORMAL HIGH (ref 65–99)

## 2015-04-14 SURGERY — URETEROSCOPY, WITH LITHOTRIPSY USING HOLMIUM LASER
Anesthesia: General | Laterality: Left

## 2015-04-14 MED ORDER — DEXAMETHASONE SODIUM PHOSPHATE 10 MG/ML IJ SOLN
INTRAMUSCULAR | Status: DC | PRN
  Administered 2015-04-14: 5 mg via INTRAVENOUS

## 2015-04-14 MED ORDER — OXYCODONE-ACETAMINOPHEN 5-325 MG PO TABS
1.0000 | ORAL_TABLET | ORAL | Status: DC | PRN
Start: 2015-04-14 — End: 2015-04-28

## 2015-04-14 MED ORDER — FENTANYL CITRATE (PF) 100 MCG/2ML IJ SOLN
INTRAMUSCULAR | Status: DC | PRN
  Administered 2015-04-14 (×2): 50 ug via INTRAVENOUS

## 2015-04-14 MED ORDER — FAMOTIDINE 20 MG PO TABS
ORAL_TABLET | ORAL | Status: AC
  Administered 2015-04-14: 20 mg via ORAL
  Filled 2015-04-14: qty 1

## 2015-04-14 MED ORDER — EPHEDRINE SULFATE 50 MG/ML IJ SOLN
INTRAMUSCULAR | Status: DC | PRN
  Administered 2015-04-14: 5 mg via INTRAVENOUS
  Administered 2015-04-14: 10 mg via INTRAVENOUS

## 2015-04-14 MED ORDER — ONDANSETRON HCL 4 MG/2ML IJ SOLN
INTRAMUSCULAR | Status: DC | PRN
  Administered 2015-04-14: 4 mg via INTRAVENOUS

## 2015-04-14 MED ORDER — LIDOCAINE HCL (CARDIAC) 20 MG/ML IV SOLN
INTRAVENOUS | Status: DC | PRN
  Administered 2015-04-14: 100 mg via INTRAVENOUS

## 2015-04-14 MED ORDER — IOTHALAMATE MEGLUMINE 43 % IV SOLN
INTRAVENOUS | Status: DC | PRN
  Administered 2015-04-14: 10 mL via URETHRAL

## 2015-04-14 MED ORDER — PROPOFOL 10 MG/ML IV BOLUS
INTRAVENOUS | Status: DC | PRN
  Administered 2015-04-14: 10 mg via INTRAVENOUS
  Administered 2015-04-14: 100 mg via INTRAVENOUS

## 2015-04-14 MED ORDER — CEFAZOLIN SODIUM-DEXTROSE 2-3 GM-% IV SOLR
INTRAVENOUS | Status: AC
  Filled 2015-04-14: qty 50

## 2015-04-14 MED ORDER — SUCCINYLCHOLINE CHLORIDE 20 MG/ML IJ SOLN
INTRAMUSCULAR | Status: DC | PRN
Start: 2015-04-14 — End: 2015-04-14
  Administered 2015-04-14: 100 mg via INTRAVENOUS

## 2015-04-14 MED ORDER — NEOSTIGMINE METHYLSULFATE 10 MG/10ML IV SOLN
INTRAVENOUS | Status: DC | PRN
  Administered 2015-04-14: 3 mg via INTRAVENOUS

## 2015-04-14 MED ORDER — CEFAZOLIN SODIUM-DEXTROSE 2-3 GM-% IV SOLR
2.0000 g | Freq: Once | INTRAVENOUS | Status: AC
  Administered 2015-04-14: 2 g via INTRAVENOUS

## 2015-04-14 MED ORDER — GLYCOPYRROLATE 0.2 MG/ML IJ SOLN
INTRAMUSCULAR | Status: DC | PRN
Start: 2015-04-14 — End: 2015-04-14
  Administered 2015-04-14: .5 mg via INTRAVENOUS

## 2015-04-14 MED ORDER — ROCURONIUM BROMIDE 100 MG/10ML IV SOLN
INTRAVENOUS | Status: DC | PRN
  Administered 2015-04-14: 10 mg via INTRAVENOUS
  Administered 2015-04-14: 20 mg via INTRAVENOUS

## 2015-04-14 MED ORDER — ONDANSETRON HCL 4 MG/2ML IJ SOLN: 4.0000 mg | Freq: Once | INTRAMUSCULAR | Status: DC | PRN

## 2015-04-14 MED ORDER — FAMOTIDINE 20 MG PO TABS
20.0000 mg | ORAL_TABLET | Freq: Once | ORAL | Status: AC
  Administered 2015-04-14: 20 mg via ORAL

## 2015-04-14 MED ORDER — FENTANYL CITRATE (PF) 100 MCG/2ML IJ SOLN: 25.0000 ug | INTRAMUSCULAR | Status: DC | PRN

## 2015-04-14 MED ORDER — OXYBUTYNIN CHLORIDE 5 MG PO TABS: 5.0000 mg | ORAL_TABLET | Freq: Three times a day (TID) | ORAL | Status: DC | PRN

## 2015-04-14 MED ORDER — PHENAZOPYRIDINE HCL 200 MG PO TABS: 200.0000 mg | ORAL_TABLET | Freq: Three times a day (TID) | ORAL | Status: DC | PRN

## 2015-04-14 MED ORDER — MIDAZOLAM HCL 2 MG/2ML IJ SOLN
INTRAMUSCULAR | Status: DC | PRN
  Administered 2015-04-14: 2 mg via INTRAVENOUS

## 2015-04-14 MED ORDER — SODIUM CHLORIDE 0.9 % IV SOLN
INTRAVENOUS | Status: DC
Start: 2015-04-14 — End: 2015-04-14
  Administered 2015-04-14: 07:00:00 via INTRAVENOUS

## 2015-04-14 SURGICAL SUPPLY — 31 items
ADAPTER SCOPE UROLOK II (MISCELLANEOUS) IMPLANT
BAG DRAIN CYSTO-URO LG1000N (MISCELLANEOUS) ×2 IMPLANT
BASKET ZERO TIP 1.9FR (BASKET) ×2 IMPLANT
CATH URETL 5X70 OPEN END (CATHETERS) ×2 IMPLANT
CNTNR SPEC 2.5X3XGRAD LEK (MISCELLANEOUS) ×1
CONRAY 43 FOR UROLOGY 50M (MISCELLANEOUS) ×2 IMPLANT
CONT SPEC 4OZ STER OR WHT (MISCELLANEOUS) ×1
CONTAINER SPEC 2.5X3XGRAD LEK (MISCELLANEOUS) ×1 IMPLANT
GLOVE BIO SURGEON STRL SZ 6.5 (GLOVE) ×2 IMPLANT
GLOVE BIO SURGEON STRL SZ7 (GLOVE) ×4 IMPLANT
GOWN STRL REUS W/ TWL LRG LVL4 (GOWN DISPOSABLE) ×2 IMPLANT
GOWN STRL REUS W/TWL LRG LVL4 (GOWN DISPOSABLE) ×2
GUIDEWIRE GREEN .038 145CM (MISCELLANEOUS) ×2 IMPLANT
INTRODUCER DILATOR DOUBLE (INTRODUCER) IMPLANT
KIT RM TURNOVER CYSTO AR (KITS) ×2 IMPLANT
LASER HOLMIUM SU 200UM (MISCELLANEOUS) ×2 IMPLANT
LASER HOLMIUM SU 940UM (MISCELLANEOUS) ×2 IMPLANT
PACK CYSTO AR (MISCELLANEOUS) ×2 IMPLANT
PREP PVP WINGED SPONGE (MISCELLANEOUS) ×2 IMPLANT
PUMP SINGLE ACTION SAP (PUMP) IMPLANT
SENSORWIRE 0.038 NOT ANGLED (WIRE) ×4
SET CYSTO W/LG BORE CLAMP LF (SET/KITS/TRAYS/PACK) ×2 IMPLANT
SHEATH URETERAL 12FRX35CM (MISCELLANEOUS) IMPLANT
SOL .9 NS 3000ML IRR  AL (IV SOLUTION) ×1
SOL .9 NS 3000ML IRR UROMATIC (IV SOLUTION) ×1 IMPLANT
STENT URET 6FRX24 CONTOUR (STENTS) ×2 IMPLANT
STENT URET 6FRX26 CONTOUR (STENTS) IMPLANT
SURGILUBE 2OZ TUBE FLIPTOP (MISCELLANEOUS) ×2 IMPLANT
SYRINGE IRR TOOMEY STRL 70CC (SYRINGE) ×2 IMPLANT
WATER STERILE IRR 1000ML POUR (IV SOLUTION) ×2 IMPLANT
WIRE SENSOR 0.038 NOT ANGLED (WIRE) ×2 IMPLANT

## 2015-04-14 NOTE — Op Note (Addendum)
Date of procedure: 04/14/2015  Preoperative diagnosis:  1. Left UPJ stone   Postoperative diagnosis:  1. Left UPJ stone   Procedure: 1. Left ureteroscopy 2. Laser lithotripsy 3. Left retrograde pyelogram 4. Basket extraction of Stone fragment 5. Left ureteral stent placement  Surgeon: Hollice Espy, MD  Anesthesia: General  Complications: None  Intraoperative findings: 5 mm mildly impacted left UPJ stone  EBL: Minimal  Specimens: Stone fragment  Drains: 6 x 24 French double-J ureteral stent on left   Indication: Kara [name] is a 69 y.o. patient with acute onset left flank pain found to have a 5 mm left UPJ stone with proximal hydronephrosis. Stone was unable to be seen on KUB therefore she was counseled to undergo ureteroscopy.  After reviewing the management options for treatment, he elected to proceed with the above surgical procedure(s). We have discussed the potential benefits and risks of the procedure, side effects of the proposed treatment, the likelihood of the patient achieving the goals of the procedure, and any potential problems that might occur during the procedure or recuperation. Informed consent has been obtained.  Description of procedure:  The patient was taken to the operating room and general anesthesia was induced.  The patient was placed in the dorsal lithotomy position, prepped and draped in the usual sterile fashion, and preoperative antibiotics were administered. A preoperative time-out was performed.   A rigid 21 French cystoscope was advanced per urethra into the bladder. Attention was turned to the left ureteral orifice which was cannulated using a 5 [place] open-ended ureteral catheter just within the UO. A gentle retrograde pyelogram was performed which revealed a decompressed ureter up to level of the UPJ and no contrast is seen to pass above the area of the presumed stone which had previously been seen on CT scan. I was able to advance a sensor  wire past the stone into the level of the renal pelvis. A second Super Stiff wire was advanced just proximal to the area of the stone but not beyond the stone. I then attempted to pass a 7 [place] flexible ureteroscope up to the level of the stone and was successful in passing the scope up to the proximal ureter, however, was unable to pass the scope beyond the area of narrowing at the UPJ over a wire under direct visualization. The UPJ was sufficiently narrowed that this was not possible. I then attempted to advance a 4.5 [place] long ureteroscope up to level of the stone and was ultimately successful. I was able to advance this beyond the narrowed UPJ and the stone was seen mildly impacted just beyond this narrowing. A 200  laser fiber was then brought in and using the settings of 0.8 J and 10 Hz, the stone was fragmented into approximately 12-15 very small fragments. Each of these fragments were then basketed out and sent off for stone specimen. Once the ureter was adequately cleared, I removed the second safety wire. I was not able to get the scope into the renal pelvis but do not think that there was any significant retropulsion of stone fragments. A 6 x 24 French double-J ureteral stent was then advanced over the safety wire up to level of the renal pelvis. The wire was partially withdrawn until a coil was noted within the renal pelvis. Wire was then fully withdrawn and a coil was noted within the bladder. The rigid cystoscope was reintroduced into the bladder and the bladder was then drained. The patient was then cleaned and  dried, her repositioned the supine position, and reversed from anesthesia. She was taken the PACU in stable condition. There are no common locations of this case. Next  Plan: Patient will follow-up in 1 week for cystoscopy, stent removal. She does have a right-sided stone which will need to be addressed down the road.  Hollice Espy, M.D.

## 2015-04-14 NOTE — Discharge Instructions (Signed)
You have a ureteral stent in place.  This is a tube that extends from your kidney to your bladder.  This may cause urinary bleeding, burning with urination, and urinary frequency.  Please call our office or present to the ED if you develop fevers >101 or pain which is not able to be controlled with oral pain medications.  You may be given either Flomax and/ or ditropan to help with bladder spasms and stent pain in addition to pain medications.    [place] [address] [place], [place] [address] [phone]

## 2015-04-14 NOTE — Anesthesia Preprocedure Evaluation (Signed)
Anesthesia Evaluation  Patient identified by MRN, date of birth, ID band Patient awake    Reviewed: Allergy & Precautions, NPO status , Patient's Chart, lab work & pertinent test results, reviewed documented beta blocker date and time   Airway Mallampati: III  TM Distance: >3 FB     Dental  (+) Chipped   Pulmonary shortness of breath, sleep apnea , former smoker,           Cardiovascular hypertension, Pt. on medications      Neuro/Psych    GI/Hepatic   Endo/Other  diabetes, Type 2  Renal/GU Renal InsufficiencyRenal disease     Musculoskeletal  (+) Arthritis ,   Abdominal   Peds  Hematology   Anesthesia Other Findings Obese.  Reproductive/Obstetrics                             Anesthesia Physical Anesthesia Plan  ASA: III  Anesthesia Plan: General   Post-op Pain Management:    Induction: Intravenous  Airway Management Planned: Oral ETT and LMA  Additional Equipment:   Intra-op Plan:   Post-operative Plan:   Informed Consent: I have reviewed the patients History and Physical, chart, labs and discussed the procedure including the risks, benefits and alternatives for the proposed anesthesia with the patient or authorized representative who has indicated his/her understanding and acceptance.     Plan Discussed with: CRNA  Anesthesia Plan Comments:         Anesthesia Quick Evaluation

## 2015-04-14 NOTE — Transfer of Care (Signed)
Immediate Anesthesia Transfer of Care Note  Patient: Kara [name]  Procedure(s) Performed: Procedure(s): URETEROSCOPY WITH HOLMIUM LASER LITHOTRIPSY (Left) CYSTOSCOPY WITH STENT PLACEMENT (Left)  Patient Location: PACU  Anesthesia Type:General  Level of Consciousness: awake, alert , oriented and patient cooperative  Airway & Oxygen Therapy: Patient Spontanous Breathing and Patient connected to nasal cannula oxygen  Post-op Assessment: Report given to RN, Post -op Vital signs reviewed and stable and Patient moving all extremities  Post vital signs: Reviewed and stable  Last Vitals:  Filed Vitals:   04/14/15 0613 04/14/15 0850  BP: 175/55 156/57  Pulse: 83 96  Temp: 36.9 C 36.8 C  Resp: 20 19    Complications: No apparent anesthesia complications

## 2015-04-14 NOTE — Anesthesia Postprocedure Evaluation (Signed)
Anesthesia Post Note  Patient: MARGERITE HOLZSCHUH  Procedure(s) Performed: Procedure(s) (LRB): URETEROSCOPY WITH HOLMIUM LASER LITHOTRIPSY (Left) CYSTOSCOPY WITH STENT PLACEMENT (Left)  Patient location during evaluation: PACU Level of consciousness: awake Pain management: pain level controlled Vital Signs Assessment: post-procedure vital signs reviewed and stable Respiratory status: spontaneous breathing Cardiovascular status: blood pressure returned to baseline Anesthetic complications: no    Last Vitals:  Filed Vitals:   04/14/15 1005 04/14/15 1023  BP: 149/65 151/55  Pulse: 74   Temp:    Resp:      Last Pain:  Filed Vitals:   04/14/15 1026  PainSc: [place]

## 2015-04-14 NOTE — Interval H&P Note (Signed)
History and Physical Interval Note:  04/14/2015 7:11 AM  Kara [name]  has presented today for surgery, with the diagnosis of NEPHROLITHIASIS  The various methods of treatment have been discussed with the patient and family. After consideration of risks, benefits and other options for treatment, the patient has consented to  Procedure(s): URETEROSCOPY WITH HOLMIUM LASER LITHOTRIPSY (Left) CYSTOSCOPY WITH STENT PLACEMENT (Left) as a surgical intervention .  The patient's history has been reviewed, patient examined, no change in status, stable for surgery.  I have reviewed the patient's chart and labs.  Questions were answered to the patient's satisfaction.    RRR CTAB  Not a candidate for ESWL due to inability to see stone on KUB.  Discussed L URS, LL, stent.   Hollice Espy

## 2015-04-14 NOTE — Anesthesia Procedure Notes (Signed)
Procedure Name: Intubation Date/Time: 04/14/2015 7:39 AM Performed by: Alda Berthold Pre-anesthesia Checklist: Patient identified, Patient being monitored, Timeout performed, Emergency Drugs available and Suction available Patient Re-evaluated:Patient Re-evaluated prior to inductionOxygen Delivery Method: Circle system utilized Preoxygenation: Pre-oxygenation with 100% oxygen Intubation Type: IV induction Ventilation: Mask ventilation without difficulty Laryngoscope Size: 3 and McGraph Grade View: Grade I Tube type: Oral Tube size: 6.5 mm Number of attempts: 1 Airway Equipment and Method: Stylet Placement Confirmation: ETT inserted through vocal cords under direct vision,  positive ETCO2 and breath sounds checked- equal and bilateral Secured at: 20 cm Tube secured with: Tape Dental Injury: Teeth and Oropharynx as per pre-operative assessment  Comments: Elective use of McGrath size 3 blade. Pt has small mouth opening. Grade 2 view obtained with McGrath and ETT passed easily.

## 2015-04-14 NOTE — H&P (View-Only) (Signed)
04/07/2015 3:52 PM   Kara [name] August 11, 1945 [id number]  Referring provider: Lavera Guise, MD [address] [place], [place] [address]  Chief Complaint  Patient presents with  . Nephrolithiasis    HPI: 69 yo with history of nephrolithiasis who presents today to the office with onset of LLQ pain x 3 days, nausea, vomiting.  She is concerned that she may be passing another stone. The pain comes and goes.  +chills but no fevers.   No dysuria or gross hematuria.    She has only passed on other stone just this past spring.   She was scheduled for ESWL by Dr. Elnoria Howard, however, the stone was unable to be visualized at that time and she presumably passed it spontaneously.  Prior to 3 days ago, she did not have any flank pain or any other symptoms consistent with stones.   PMH: Past Medical History  Diagnosis Date  . Lumbago   . Hyperlipemia   . Osteoarthrosis   . SOB (shortness of breath)   . Cough variant asthma   . Edema   . Ovarian failure   . Hypersomnia with sleep apnea   . 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 Arthropathy   . Diabetic retinopathy ([place])   . Hypertension   . Chest pain   . Benign neoplasm of breast   . Diabetes ([place])   . Nephrolithiasis   . Kidney stones, calcium oxalate     Surgical History: Past Surgical History  Procedure Laterality Date  . Extracorporeal shock wave lithotripsy Left 09/29/2014    Procedure: EXTRACORPOREAL SHOCK WAVE LITHOTRIPSY (ESWL);  Surgeon: Hollice Espy, MD;  Location: [facility] ORS;  Service: Urology;  Laterality: Left;  . Laparoscopic oopherectomy Left   . Tonsillectomy    . Cholecystectomy    . Cataract extraction    . Breast ductal system excision      Home Medications:    Medication List       This list is accurate as of: 04/07/15  3:52 PM.  Always use your most recent med list.               aspirin EC 81 MG tablet  Take 81 mg by mouth daily. Reported on 04/07/2015     calcium citrate-vitamin D  315-200 MG-UNIT tablet  Commonly known as:  CITRACAL+D  Take 1 tablet by mouth daily.     cetirizine 10 MG tablet  Commonly known as:  ZYRTEC  Take 10 mg by mouth as needed for allergies.     cholecalciferol 1000 UNITS tablet  Commonly known as:  VITAMIN D  Take 1,000 Units by mouth daily.     furosemide 10 MG/ML solution  Commonly known as:  LASIX  Take 10 mg by mouth daily as needed.     glimepiride 4 MG tablet  Commonly known as:  AMARYL  Take 4 mg by mouth 2 (two) times daily.     hydrALAZINE 10 MG tablet  Commonly known as:  APRESOLINE  Take 10 mg by mouth 3 (three) times daily.     HYDROcodone-acetaminophen 5-325 MG tablet  Commonly known as:  NORCO/VICODIN  Take 1-2 tablets by mouth every 6 (six) hours as needed for moderate pain.     insulin detemir 100 UNIT/ML injection  Commonly known as:  LEVEMIR  Inject 75 Units into the skin daily after supper.     loperamide 2 MG tablet  Commonly known as:  IMODIUM A-D  Take 2 mg  by mouth as needed for diarrhea or loose stools.     losartan 25 MG tablet  Commonly known as:  COZAAR  Take 25 mg by mouth daily.     pregabalin 100 MG capsule  Commonly known as:  LYRICA  Take 100 mg by mouth 2 (two) times daily.     rosuvastatin 20 MG tablet  Commonly known as:  CRESTOR  Take 20 mg by mouth daily.     tamsulosin 0.4 MG Caps capsule  Commonly known as:  FLOMAX  Take 1 capsule (0.4 mg total) by mouth daily.     traMADol-acetaminophen 37.5-325 MG tablet  Commonly known as:  ULTRACET  Take 1 tablet by mouth every evening.     verapamil 40 MG tablet  Commonly known as:  CALAN  Take 40 mg by mouth 2 (two) times daily.        Allergies: No Known Allergies  Family History: Family History  Problem Relation Age of Onset  . Prostate cancer Neg Hx   . Bladder Cancer Neg Hx   . Kidney cancer Neg Hx     Social History:  reports that she has quit smoking. She does not have any smokeless tobacco history on file. She  reports that she does not drink alcohol or use illicit drugs.  ROS: UROLOGY Frequent Urination?: No Hard to postpone urination?: No Burning/pain with urination?: No Get up at night to urinate?: No Leakage of urine?: No Urine stream starts and stops?: No Trouble starting stream?: Yes Do you have to strain to urinate?: Yes Blood in urine?: No Urinary tract infection?: No Sexually transmitted disease?: No Injury to kidneys or bladder?: No Painful intercourse?: No Weak stream?: Yes Currently pregnant?: No Vaginal bleeding?: No Last menstrual period?: n  Gastrointestinal Nausea?: Yes Vomiting?: No Indigestion/heartburn?: No Diarrhea?: No Constipation?: No  Constitutional Fever: No Night sweats?: No Weight loss?: No Fatigue?: Yes  Skin Skin rash/lesions?: No Itching?: No  Eyes Blurred vision?: No Double vision?: No  Ears/Nose/Throat Sore throat?: No Sinus problems?: No  Hematologic/Lymphatic Swollen glands?: No Easy bruising?: Yes  Cardiovascular Leg swelling?: No Chest pain?: No  Respiratory Cough?: No Shortness of breath?: Yes  Endocrine Excessive thirst?: No  Musculoskeletal Back pain?: Yes Joint pain?: No  Neurological Headaches?: No Dizziness?: Yes  Psychologic Depression?: No Anxiety?: No  Physical Exam: BP 186/86 mmHg  Pulse 102  Temp(Src) 98.7 F (37.1 C) (Oral)  Ht 5\' 4"  (1.626 m)  Wt 221 lb 9.6 oz (100.517 kg)  BMI 38.02 kg/m2  T 97.7 Constitutional:  Alert and oriented, No acute distress. HEENT: [place] AT, moist mucus membranes.  Trachea midline, no masses. Cardiovascular: No clubbing, cyanosis, or edema. Respiratory: Normal respiratory effort, no increased work of breathing. GI: Abdomen is soft, nondistended, no abdominal masses.  Tenderness to deep palpation.   GU: No CVA tenderness. Skin: No rashes, bruises or suspicious lesions. Neurologic: Grossly intact, no focal deficits, moving all 4 extremities. Psychiatric: Normal  mood and affect.  Laboratory Data: Lab Results  Component Value Date   WBC 5.7 03/08/2013   HGB 11.6* 03/08/2013   HCT 34.8* 03/08/2013   MCV 87 03/08/2013   PLT 145* 03/08/2013    Lab Results  Component Value Date   CREATININE 1.47* 03/08/2013    Urinalysis UA dip today shows 3+ blood, 1+ protein, 1+LE.  Micro shows 0-5 WBC, 3-10 RBC, few bacteria.    Pertinent Imaging: Stat CT abdomen pelvis ordered, pending this afternoon  Assessment & Plan:  68  yo F with history of kidney stones presenting with 3 day LLQ pain.  I suspect she is likely passing another stone. Given the questionable appearance of her stones in the past on KUB, I would like to proceed with a CT abdomen and pelvis today for definitive diagnosis. I will call her with the results of this as soon as I receive them. We will develop a plan he is on the size and location of the stone if present. In the interim, she was given Flomax as well as pain medication to use as needed. She was advised to present to the emergency room if her pain is unable to be controlled or she develops fevers or chills.  1. History of nephrolithiasis - Urinalysis, Complete - CT RENAL STONE STUDY  2. Left lower quadrant pain   Return for STAT CT scan, will call with results.  Hollice Espy, MD  [facility] [address] [place], [place] [address] [phone]

## 2015-04-21 LAB — STONE ANALYSIS
CA OXALATE, MONOHYDR.: 20 %
Ca Oxalate,Dihydrate: 5 %
Ca phos cry stone ql IR: 3 %
STONE WEIGHT KSTONE: 9 mg
Uric Acid: 72 %

## 2015-04-28 ENCOUNTER — Ambulatory Visit (INDEPENDENT_AMBULATORY_CARE_PROVIDER_SITE_OTHER): Payer: Commercial Managed Care - HMO | Admitting: Urology

## 2015-04-28 ENCOUNTER — Encounter: Payer: Self-pay | Admitting: Urology

## 2015-04-28 VITALS — BP 196/81 | HR 76 | Ht 64.0 in | Wt 214.0 lb

## 2015-04-28 DIAGNOSIS — N201 Calculus of ureter: Secondary | ICD-10-CM | POA: Diagnosis not present

## 2015-04-28 DIAGNOSIS — N200 Calculus of kidney: Secondary | ICD-10-CM

## 2015-04-28 LAB — URINALYSIS, COMPLETE
BILIRUBIN UA: NEGATIVE
Glucose, UA: NEGATIVE
Ketones, UA: NEGATIVE
Nitrite, UA: NEGATIVE
PH UA: 5.5 (ref 5.0–7.5)
Specific Gravity, UA: 1.01 (ref 1.005–1.030)
UUROB: 0.2 mg/dL (ref 0.2–1.0)

## 2015-04-28 LAB — MICROSCOPIC EXAMINATION: RBC, UA: >30 /hpf — ABNORMAL HIGH (ref 0–?)

## 2015-04-28 MED ORDER — CIPROFLOXACIN HCL 500 MG PO TABS
500.0000 mg | ORAL_TABLET | Freq: Once | ORAL | Status: AC
  Administered 2015-04-28: 500 mg via ORAL

## 2015-04-28 MED ORDER — LIDOCAINE HCL 2 % EX GEL
1.0000 "application " | Freq: Once | CUTANEOUS | Status: AC
  Administered 2015-04-28: 1 via URETHRAL

## 2015-04-28 NOTE — Progress Notes (Signed)
3:36 PM  04/28/2015   Kara [name] 1945/06/29 [id number]  Referring provider: Lavera Guise, MD [address] [place], [place] [address]  Chief Complaint  Patient presents with  . Cysto Stent Removal    HPI: 70 yo with history of nephrolithiasis with a left 5 mm UPJ stone s/p left ureteroscopy, laser lithotripsy, left ureteral stent placement on 04/14/2015. Surgery was uncomplicated although the stone was noted to be impacted in the left UPJ.  She returns to the office today for cystoscopy, stent removal.  She has tolerated this time. Well. She has very minimal complaints today. No fevers or chills. No voiding issues.  She does also have nonobstructing right-sided stones which will eventually need to be addressed.  PMH: Past Medical History  Diagnosis Date  . Lumbago   . Hyperlipemia   . Osteoarthrosis   . SOB (shortness of breath)   . Edema   . Ovarian failure   . Hypersomnia with sleep apnea   . 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 Arthropathy   . Diabetic retinopathy ([place])   . Hypertension   . Chest pain   . Benign neoplasm of breast   . Diabetes ([place])   . Nephrolithiasis   . Kidney stones, calcium oxalate     Surgical History: Past Surgical History  Procedure Laterality Date  . Extracorporeal shock wave lithotripsy Left 09/29/2014    Procedure: EXTRACORPOREAL SHOCK WAVE LITHOTRIPSY (ESWL);  Surgeon: Hollice Espy, MD;  Location: [facility] ORS;  Service: Urology;  Laterality: Left;  . Laparoscopic oopherectomy Left   . Tonsillectomy    . Cholecystectomy    . Cataract extraction    . Breast ductal system excision    . Ureteroscopy with holmium laser lithotripsy Left 04/14/2015    Procedure: URETEROSCOPY WITH HOLMIUM LASER LITHOTRIPSY;  Surgeon: Hollice Espy, MD;  Location: [facility] ORS;  Service: Urology;  Laterality: Left;  . Cystoscopy with stent placement Left 04/14/2015    Procedure: CYSTOSCOPY WITH STENT PLACEMENT;  Surgeon: Hollice Espy, MD;   Location: [facility] ORS;  Service: Urology;  Laterality: Left;    Home Medications:    Medication List       This list is accurate as of: 04/28/15  3:36 PM.  Always use your most recent med list.               aspirin EC 81 MG tablet  Take 81 mg by mouth daily. Reported on 04/07/2015     cetirizine 10 MG tablet  Commonly known as:  ZYRTEC  Take 10 mg by mouth as needed for allergies.     cholecalciferol 1000 units tablet  Commonly known as:  VITAMIN D  Take 1,000 Units by mouth daily. At 8 pm     furosemide 10 MG/ML solution  Commonly known as:  LASIX  Take 10 mg by mouth daily as needed.     glimepiride 4 MG tablet  Commonly known as:  AMARYL  Take 4 mg by mouth 2 (two) times daily.     hydrALAZINE 10 MG tablet  Commonly known as:  APRESOLINE  Take 10 mg by mouth 3 (three) times daily.     insulin detemir 100 UNIT/ML injection  Commonly known as:  LEVEMIR  Inject 75 Units into the skin daily after supper.     lactobacillus acidophilus Tabs tablet  Take 2 tablets by mouth daily at 12 noon. At noon     loperamide 2 MG tablet  Commonly known as:  IMODIUM A-D  Take 2 mg by mouth as needed for diarrhea or loose stools.     losartan 25 MG tablet  Commonly known as:  COZAAR  Take 25 mg by mouth daily at 12 noon.     multivitamin tablet  Take 1 tablet by mouth daily at 12 noon.     pregabalin 100 MG capsule  Commonly known as:  LYRICA  Take 100 mg by mouth 2 (two) times daily.     rosuvastatin 20 MG tablet  Commonly known as:  CRESTOR  Take 20 mg by mouth at bedtime.     traMADol-acetaminophen 37.5-325 MG tablet  Commonly known as:  ULTRACET  Take 1 tablet by mouth every evening.     TRULICITY Home  Inject 1 Dose into the skin. Taken once a week on Maondays     verapamil 40 MG tablet  Commonly known as:  CALAN  Take 40 mg by mouth 2 (two) times daily.        Allergies: No Known Allergies  Family History: Family History  Problem Relation Age of Onset    . Prostate cancer Neg Hx   . Bladder Cancer Neg Hx   . Kidney cancer Neg Hx     Social History:  reports that she quit smoking about 20 years ago. She does not have any smokeless tobacco history on file. She reports that she does not drink alcohol or use illicit drugs.   Physical Exam: BP 196/81 mmHg  Pulse 76  Ht 5\' 4"  (1.626 m)  Wt 214 lb (97.07 kg)  BMI 36.72 kg/m2  T 97.7 Constitutional:  Alert and oriented, No acute distress. HEENT: [place] AT, moist mucus membranes.  Trachea midline, no masses. Cardiovascular: No clubbing, cyanosis, or edema. Respiratory: Normal respiratory effort, no increased work of breathing. GI: Abdomen is soft, nondistended, no abdominal masses.  Tenderness to deep palpation.   GU: No CVA tenderness. Normal external genitalia, normal urethral meatus  Skin: No rashes, bruises or suspicious lesions. Neurologic: Grossly intact, no focal deficits, moving all 4 extremities. Psychiatric: Normal mood and affect.  Laboratory Data: Lab Results  Component Value Date   WBC 5.7 03/08/2013   HGB 11.6* 03/08/2013   HCT 34.8* 03/08/2013   MCV 87 03/08/2013   PLT 145* 03/08/2013    Lab Results  Component Value Date   CREATININE 1.47* 03/08/2013    Urinalysis UA reviewed, see Epic. No contraindications for stent removal today.  Cystoscopy/ Stent removal procedure  Patient identification was confirmed, informed consent was obtained, and patient was prepped using Betadine solution.  Lidocaine jelly was administered per urethral meatus.    Preoperative abx where received prior to procedure.    Procedure: - Flexible cystoscope introduced, without any difficulty.   - Thorough search of the bladder revealed:    normal urethral meatus  Stent seen emanating from left ureteral orifice, grasped with stent graspers, and removed in entirety.    Post-Procedure: - Patient tolerated the procedure well   Assessment & Plan:   1. Obstruction of left ureteropelvic  junction (UPJ) due to stone S/p L URS, LL, stent 04/14/15 for impacted 5 mm UPJ stone.  Stent removed today without complications Discussed post procedure warning signs F/u in 4 weeks with RUS prior - Urinalysis, Complete - ciprofloxacin (CIPRO) tablet 500 mg; Take 1 tablet (500 mg total) by mouth once. - lidocaine (XYLOCAINE) 2 % jelly 1 application; Place 1 application into the urethra once. - US Renal; Future  2. Right  kidney stone Will address at next visit   Return in about 4 weeks (around 05/26/2015) for f/u renal utlrasound.  Hollice Espy, MD  [facility] [address] [place], [place] [address] [phone]

## 2015-05-16 DIAGNOSIS — E1165 Type 2 diabetes mellitus with hyperglycemia: Secondary | ICD-10-CM | POA: Diagnosis not present

## 2015-05-16 DIAGNOSIS — H65117 Acute and subacute allergic otitis media (mucoid) (sanguinous) (serous), recurrent, unspecified ear: Secondary | ICD-10-CM | POA: Diagnosis not present

## 2015-05-16 DIAGNOSIS — I10 Essential (primary) hypertension: Secondary | ICD-10-CM | POA: Diagnosis not present

## 2015-05-16 DIAGNOSIS — N200 Calculus of kidney: Secondary | ICD-10-CM | POA: Diagnosis not present

## 2015-05-16 DIAGNOSIS — R944 Abnormal results of kidney function studies: Secondary | ICD-10-CM | POA: Diagnosis not present

## 2015-05-17 ENCOUNTER — Ambulatory Visit
Admission: RE | Admit: 2015-05-17 | Discharge: 2015-05-17 | Disposition: A | Discharge status: Home / Self Care | Payer: Commercial Managed Care - HMO | Source: Ambulatory Visit | Attending: Urology | Admitting: Urology

## 2015-05-17 DIAGNOSIS — N201 Calculus of ureter: Secondary | ICD-10-CM

## 2015-05-17 DIAGNOSIS — N200 Calculus of kidney: Secondary | ICD-10-CM | POA: Diagnosis not present

## 2015-05-26 ENCOUNTER — Encounter: Payer: Self-pay | Admitting: Urology

## 2015-05-26 ENCOUNTER — Ambulatory Visit (INDEPENDENT_AMBULATORY_CARE_PROVIDER_SITE_OTHER): Payer: Commercial Managed Care - HMO | Admitting: Urology

## 2015-05-26 VITALS — BP 180/79 | HR 76 | Ht 64.0 in | Wt 221.8 lb

## 2015-05-26 DIAGNOSIS — R3129 Other microscopic hematuria: Secondary | ICD-10-CM | POA: Diagnosis not present

## 2015-05-26 DIAGNOSIS — N135 Crossing vessel and stricture of ureter without hydronephrosis: Secondary | ICD-10-CM | POA: Diagnosis not present

## 2015-05-26 DIAGNOSIS — N200 Calculus of kidney: Secondary | ICD-10-CM | POA: Diagnosis not present

## 2015-05-26 LAB — URINALYSIS, COMPLETE
BILIRUBIN UA: NEGATIVE
Glucose, UA: NEGATIVE
Ketones, UA: NEGATIVE
LEUKOCYTES UA: NEGATIVE
Nitrite, UA: NEGATIVE
PH UA: 5 (ref 5.0–7.5)
PROTEIN UA: NEGATIVE
Specific Gravity, UA: <1.005 — ABNORMAL LOW (ref 1.005–1.030)
Urobilinogen, Ur: 0.2 mg/dL (ref 0.2–1.0)

## 2015-05-26 LAB — MICROSCOPIC EXAMINATION

## 2015-05-26 NOTE — Progress Notes (Signed)
9:37 AM  05/26/2015   Kara [name] [birth year] [id number]  Referring provider: Lavera Guise, MD [address] [place],  [address]  Chief Complaint  Patient presents with  . Follow-up    Renal Ultrasound     HPI: 70 yo with history of nephrolithiasis with a left 5 mm UPJ stone s/p left ureteroscopy, laser lithotripsy, left ureteral stent placement on 04/14/2015. Surgery was uncomplicated although the stone was noted to be impacted in the left UPJ.   Her stent was removed subsequently on 04/28/2015 in the office.    Follow-up renal ultrasound shows anterolateral resolution of the left hydronephrosis with minimal left renal pelvic fullness (subtle) and nonobstructing right stone.    Stone analysis shows her stone is primarily uric acid, 72%.  It was also 20% calcium oxalate monohydrate, 5% calcium oxalate dihydrate, and 3% calcium phosphate.  She reports that she does not drink enough water. She denies a personal history of gout but does have some pain in her large toe on occasion but no formal diagnosis.  She has no previous history of stones. She was scheduled to undergo ESWL in the past however her stone was not able to be visualized fluoroscopically consistent with her history of uric acid stones.   PMH: Past Medical History  Diagnosis Date  . Lumbago   . Hyperlipemia   . Osteoarthrosis   . SOB (shortness of breath)   . Edema   . Ovarian failure   . Hypersomnia with sleep apnea   . 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 Arthropathy   . Diabetic retinopathy ([place])   . Hypertension   . Chest pain   . Benign neoplasm of breast   . Diabetes ([place])   . Nephrolithiasis   . Kidney stones, calcium oxalate     Surgical History: Past Surgical History  Procedure Laterality Date  . Extracorporeal shock wave lithotripsy Left 09/29/2014    Procedure: EXTRACORPOREAL SHOCK WAVE LITHOTRIPSY (ESWL);  Surgeon: Hollice Espy, MD;  Location: [facility] OR[facility];  Service: Urology;   Laterality: Left;  . Laparoscopic oopherectomy Left   . Tonsillectomy    . Cholecystectomy    . Cataract extraction    . Breast ductal system excision    . Ureteroscopy with holmium laser lithotripsy Left 04/14/2015    Procedure: URETEROSCOPY WITH HOLMIUM LASER LITHOTRIPSY;  Surgeon: Hollice Espy, MD;  Location: [facility] OR[facility];  Service: Urology;  Laterality: Left;  . Cystoscopy with stent placement Left 04/14/2015    Procedure: CYSTOSCOPY WITH STENT PLACEMENT;  Surgeon: Hollice Espy, MD;  Location: [facility] OR[facility];  Service: Urology;  Laterality: Left;    Home Medications:    Medication List       This list is accurate as of: 05/26/15  9:37 AM.  Always use your most recent med list.               aspirin EC 81 MG tablet  Take 81 mg by mouth daily. Reported on 04/07/2015     cetirizine 10 MG tablet  Commonly known as:  ZYRTEC  Take 10 mg by mouth as needed for allergies. Reported on 05/26/2015     cholecalciferol 1000 units tablet  Commonly known as:  VITAMIN D  Take 1,000 Units by mouth daily. At 8 pm     glimepiride 4 MG tablet  Commonly known as:  AMARYL  Take 4 mg by mouth 2 (two) times daily.     hydrALAZINE 10 MG tablet  Commonly known  as:  APRESOLINE  Take 10 mg by mouth 3 (three) times daily.     insulin detemir 100 UNIT/ML injection  Commonly known as:  LEVEMIR  Inject 75 Units into the skin daily after supper.     lactobacillus acidophilus Tabs tablet  Take 2 tablets by mouth daily at 12 noon. At noon     loperamide 2 MG tablet  Commonly known as:  IMODIUM A-D  Take 2 mg by mouth as needed for diarrhea or loose stools.     losartan 25 MG tablet  Commonly known as:  COZAAR  Take 25 mg by mouth daily at 12 noon.     multivitamin tablet  Take 1 tablet by mouth daily at 12 noon.     NEOMYCIN-POLYMYXIN-HYDROCORTISONE 1 % Soln otic solution  Commonly known as:  CORTISPORIN  Reported on 05/26/2015     pregabalin 100 MG capsule  Commonly known as:  LYRICA  Take  100 mg by mouth 2 (two) times daily.     rosuvastatin 20 MG tablet  Commonly known as:  CRESTOR  Take 20 mg by mouth at bedtime.     traMADol-acetaminophen 37.5-325 MG tablet  Commonly known as:  ULTRACET  Take 1 tablet by mouth every evening.     TRULICITY 1.5 0000000 Sopn  Generic drug:  Dulaglutide     verapamil 40 MG tablet  Commonly known as:  CALAN  Take 40 mg by mouth 2 (two) times daily.        Allergies: No Known Allergies  Family History: Family History  Problem Relation Age of Onset  . Prostate cancer Neg Hx   . Bladder Cancer Neg Hx   . Kidney cancer Neg Hx     Social History:  reports that she quit smoking about 20 years ago. She does not have any smokeless tobacco history on file. She reports that she does not drink alcohol or use illicit drugs.   Physical Exam: BP 180/79 mmHg  Pulse 76  Ht 5\' 4"  (1.626 m)  Wt 221 lb 12.8 oz (100.608 kg)  BMI 38.05 kg/m2  T 97.7 Constitutional:  Alert and oriented, No acute distress. HEENT: [place] AT, moist mucus membranes.  Trachea midline, no masses. Cardiovascular: No clubbing, cyanosis, or edema. Respiratory: Normal respiratory effort, no increased work of breathing.  Skin: No rashes, bruises or suspicious lesions. Neurologic: Grossly intact, no focal deficits, moving all 4 extremities. Psychiatric: Normal mood and affect.  Laboratory Data: Cr 1.3 on 04/09/2015   Urinalysis Results for orders placed or performed in visit on 05/26/15  Microscopic Examination  Result Value Ref Range   WBC, UA 0-5 0 -  5 /hpf   RBC, UA 3-10 (A) 0 -  2 /hpf   Epithelial Cells (non renal) 0-10 0 - 10 /hpf   Bacteria, UA Few None seen/Few  Urinalysis, Complete  Result Value Ref Range   Specific Gravity, UA <1.005 (L) 1.005 - 1.030   pH, UA 5.0 5.0 - 7.5   Color, UA Yellow Yellow   Appearance Ur Clear Clear   Leukocytes, UA Negative Negative   Protein, UA Negative Negative/Trace   Glucose, UA Negative Negative   Ketones, UA  Negative Negative   RBC, UA Trace (A) Negative   Bilirubin, UA Negative Negative   Urobilinogen, Ur 0.2 0.2 - 1.0 mg/dL   Nitrite, UA Negative Negative   Microscopic Examination See below:     Imaging CLINICAL DATA: Left UPJ.  EXAM: RENAL / URINARY TRACT ULTRASOUND  COMPLETE  COMPARISON: CT 04/07/2015.  FINDINGS: Right Kidney:  Length: 9.8 cm. Echogenicity within normal limits. Cortical thinning. Punctate echogenic 9 mm focus noted consistent with nephrolithiasis. No mass or hydronephrosis visualized.  Left Kidney:  Length: 11.2 cm. Echogenicity within normal limits. No mass. Mild left renal pelvic fullness. Interim resolution of previously identified hydronephrosis .  Bladder:  Appears normal for degree of bladder distention.  IMPRESSION: 1. 9 mm right renal calyceal stone. Exam otherwise unremarkable.  2. Previously identified left renal pelvic stone no longer visualized. Mild residual left renal pelvic fullness. Interim resolution of left hydronephrosis.   Electronically Signed  By: Marcello Moores Register  On: 05/17/2015 10:25  Assessment & Plan:   1. Uric acid nephrolithiasis Status post recent left ureteroscopy for impacted UPJ stone.  Follow-up renal ultrasound today without any residual hydronephrosis.  Stone analysis review today. We discussed stone prevention techniques specifically related to uric acid nephrolithiasis and a handout was given.  Given her multiple stones, I have recommended further workup with 24-hour urine Litholink  - Urinalysis, Complete - Uric acid; Future  2. Right kidney stone Nonobstructing right-sided stones, up to 6 mm. Currently asymptomatic.  Plan to proceed with metabolic workup. Given that her stones are uric acid, may possibly treat medically with urinary alkalinization.   3. Microscopic hematuria Asymptomatic microscopic hematuria today. May be related to recent instrumentation or kidney stones. Will  continue to follow. Cystoscopy in the OR0.   Return in about 4 weeks (around 06/23/2015) for f/u litholink.  Hollice Espy, MD  [facility] [address], [place], [place] [address] [phone]

## 2015-05-27 ENCOUNTER — Encounter: Payer: Self-pay | Admitting: Urology

## 2015-06-05 ENCOUNTER — Other Ambulatory Visit: Payer: Commercial Managed Care - HMO

## 2015-06-05 DIAGNOSIS — N200 Calculus of kidney: Secondary | ICD-10-CM | POA: Diagnosis not present

## 2015-06-09 ENCOUNTER — Encounter: Payer: Self-pay | Admitting: Urology

## 2015-07-05 ENCOUNTER — Encounter: Payer: Self-pay | Admitting: Urology

## 2015-07-05 ENCOUNTER — Ambulatory Visit (INDEPENDENT_AMBULATORY_CARE_PROVIDER_SITE_OTHER): Payer: Commercial Managed Care - HMO | Admitting: Urology

## 2015-07-05 VITALS — BP 187/72 | HR 76 | Ht 64.0 in | Wt 222.0 lb

## 2015-07-05 DIAGNOSIS — N200 Calculus of kidney: Secondary | ICD-10-CM

## 2015-07-05 DIAGNOSIS — M1008 Idiopathic gout, vertebrae: Secondary | ICD-10-CM | POA: Diagnosis not present

## 2015-07-05 NOTE — Progress Notes (Signed)
4:37 PM  07/06/2015   Kara [name] 18-Jan-1946 [id number]  Referring provider: Lavera Guise, MD [address] [place], [place] [address]  Chief Complaint  Patient presents with  . Nephrolithiasis    litholink results  . Results    HPI: 70 yo with history of nephrolithiasis with a left 5 mm UPJ stone s/p left ureteroscopy, laser lithotripsy, left ureteral stent placement on 04/14/2015. Surgery was uncomplicated although the stone was noted to be impacted in the left UPJ.   Her stent was removed subsequently on 04/28/2015 in the office.    Follow-up renal ultrasound showed interval resolution of the left hydronephrosis with minimal left renal pelvic fullness (subtle) and nonobstructing right stone.    Stone analysis shows her stone is primarily uric acid, 72%.  It was also 20% calcium oxalate monohydrate, 5% calcium oxalate dihydrate, and 3% calcium phosphate.  She has no previous history of stones. She was scheduled to undergo ESWL in the past however her stone was not able to be visualized fluoroscopically consistent with her history of uric acid stones.  He returns today to review her with a link results. Her total voided Foley was 1.8 L. She did have an elevated urine oxalate at 59 mg per day. Her urinary citrate was also borderline and 492 mg per day. Otherwise, metabolic analysis was normal. Of note, her urine uric acid was normal.  Urinary pH 5.95.  Creatinine was 1.4.  Potassium 4.6.   PMH: Past Medical History  Diagnosis Date  . Lumbago   . Hyperlipemia   . Osteoarthrosis   . SOB (shortness of breath)   . Edema   . Ovarian failure   . Hypersomnia with sleep apnea   . 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 Arthropathy   . Diabetic retinopathy ([place])   . Hypertension   . Chest pain   . Benign neoplasm of breast   . Diabetes ([place])   . Nephrolithiasis   . Kidney stones, calcium oxalate     Surgical History: Past Surgical History  Procedure Laterality  Date  . Extracorporeal shock wave lithotripsy Left 09/29/2014    Procedure: EXTRACORPOREAL SHOCK WAVE LITHOTRIPSY (ESWL);  Surgeon: Hollice Espy, MD;  Location: [facility] OR[facility];  Service: Urology;  Laterality: Left;  . Laparoscopic oopherectomy Left   . Tonsillectomy    . Cholecystectomy    . Cataract extraction    . Breast ductal system excision    . Ureteroscopy with holmium laser lithotripsy Left 04/14/2015    Procedure: URETEROSCOPY WITH HOLMIUM LASER LITHOTRIPSY;  Surgeon: Hollice Espy, MD;  Location: [facility];  Service: Urology;  Laterality: Left;  . Cystoscopy with stent placement Left 04/14/2015    Procedure: CYSTOSCOPY WITH STENT PLACEMENT;  Surgeon: Hollice Espy, MD;  Location: [facility] OR[facility];  Service: Urology;  Laterality: Left;    Home Medications:    Medication List       This list is accurate as of: 07/05/15 11:59 PM.  Always use your most recent med list.               aspirin EC 81 MG tablet  Take 81 mg by mouth daily. Reported on 04/07/2015     cetirizine 10 MG tablet  Commonly known as:  ZYRTEC  Take 10 mg by mouth as needed for allergies. Reported on 05/26/2015     cholecalciferol 1000 units tablet  Commonly known as:  VITAMIN D  Take 1,000 Units by mouth daily. At 8 pm  glimepiride 4 MG tablet  Commonly known as:  AMARYL  Take 4 mg by mouth 2 (two) times daily.     hydrALAZINE 10 MG tablet  Commonly known as:  APRESOLINE  Take 10 mg by mouth 3 (three) times daily.     insulin detemir 100 UNIT/ML injection  Commonly known as:  LEVEMIR  Inject 75 Units into the skin daily after supper.     lactobacillus acidophilus Tabs tablet  Take 2 tablets by mouth daily at 12 noon. At noon     loperamide 2 MG tablet  Commonly known as:  IMODIUM A-D  Take 2 mg by mouth as needed for diarrhea or loose stools.     losartan 25 MG tablet  Commonly known as:  COZAAR  Take 25 mg by mouth daily at 12 noon.     multivitamin tablet  Take 1 tablet by mouth daily at 12  noon.     NEOMYCIN-POLYMYXIN-HYDROCORTISONE 1 % Soln otic solution  Commonly known as:  CORTISPORIN  Reported on 05/26/2015     pregabalin 100 MG capsule  Commonly known as:  LYRICA  Take 100 mg by mouth 2 (two) times daily.     rosuvastatin 20 MG tablet  Commonly known as:  CRESTOR  Take 20 mg by mouth at bedtime.     traMADol-acetaminophen 37.5-325 MG tablet  Commonly known as:  ULTRACET  Take 1 tablet by mouth every evening.     TRULICITY 1.5 0000000 Sopn  Generic drug:  Dulaglutide     verapamil 40 MG tablet  Commonly known as:  CALAN  Take 40 mg by mouth 2 (two) times daily.        Allergies: No Known Allergies  Family History: Family History  Problem Relation Age of Onset  . Prostate cancer Neg Hx   . Bladder Cancer Neg Hx   . Kidney cancer Neg Hx     Social History:  reports that she quit smoking about 20 years ago. She does not have any smokeless tobacco history on file. She reports that she does not drink alcohol or use illicit drugs.   Physical Exam: BP 187/72 mmHg  Pulse 76  Ht 5\' 4"  (1.626 m)  Wt 222 lb (100.699 kg)  BMI 38.09 kg/m2  Constitutional:  Alert and oriented, No acute distress. HEENT: [place] AT, moist mucus membranes.  Trachea midline, no masses. Cardiovascular: No clubbing, cyanosis, or edema. Respiratory: Normal respiratory effort, no increased work of breathing.  Skin: No rashes, bruises or suspicious lesions. Neurologic: Grossly intact, no focal deficits, moving all 4 extremities. Psychiatric: Normal mood and affect.  Laboratory Data: Cr 1.3 on 04/09/2015   Urinalysis Results for orders placed or performed in visit on 07/05/15  Uric acid  Result Value Ref Range   Uric Acid 4.9 2.5 - 7.1 mg/dL    Assessment & Plan:   1. Uric acid nephrolithiasis/ metabolic kidney stone disease Litholink results reviewed. Specific recommendations including increasing water intake to 3 L a day in order to produce 2.5 L of urine, decreasing  oxalate intake, increasing citric acid intake  Uric acid today within normal limits, would likely not benefit from allopurinol Discussed potassium citrate supplementation. Patient states that she is a difficult time swallowing pills therefore is not a good candidate for this. - Uric acid; Future  2. Right kidney stone Nonobstructing right-sided stones, up to 6 mm. Currently asymptomatic. Will follow with RUS as not able to be seen on KUB   Return in about  1 year (around 07/04/2016) for RUS prior . or sooner as needed  Hollice Espy, MD  [facility] [address] [place], [place] [address] [phone]

## 2015-07-06 ENCOUNTER — Telehealth: Payer: Self-pay | Admitting: *Deleted

## 2015-07-06 LAB — URIC ACID: URIC ACID: 4.9 mg/dL (ref 2.5–7.1)

## 2015-07-06 NOTE — Telephone Encounter (Signed)
-----   Message from Hollice Espy, MD sent at 07/06/2015  7:38 AM EDT ----- Please let this patient noted that her uric acid was within the normal range and she would not benefit from additional medication.  Hollice Espy, MD

## 2015-07-06 NOTE — Telephone Encounter (Signed)
Spoke with patient and gave results. Patient understands there would be no benefit from an additional medication. Patient states ok.

## 2015-07-20 ENCOUNTER — Other Ambulatory Visit: Payer: Self-pay | Admitting: Nurse Practitioner

## 2015-07-20 DIAGNOSIS — Z1231 Encounter for screening mammogram for malignant neoplasm of breast: Secondary | ICD-10-CM

## 2015-07-20 DIAGNOSIS — Z0001 Encounter for general adult medical examination with abnormal findings: Secondary | ICD-10-CM | POA: Diagnosis not present

## 2015-07-20 DIAGNOSIS — N200 Calculus of kidney: Secondary | ICD-10-CM | POA: Diagnosis not present

## 2015-07-20 DIAGNOSIS — E782 Mixed hyperlipidemia: Secondary | ICD-10-CM | POA: Diagnosis not present

## 2015-07-20 DIAGNOSIS — E1140 Type 2 diabetes mellitus with diabetic neuropathy, unspecified: Secondary | ICD-10-CM | POA: Diagnosis not present

## 2015-07-20 DIAGNOSIS — R944 Abnormal results of kidney function studies: Secondary | ICD-10-CM | POA: Diagnosis not present

## 2015-07-20 DIAGNOSIS — I10 Essential (primary) hypertension: Secondary | ICD-10-CM | POA: Diagnosis not present

## 2015-08-30 ENCOUNTER — Other Ambulatory Visit: Payer: Self-pay | Admitting: Nurse Practitioner

## 2015-08-30 ENCOUNTER — Ambulatory Visit
Admission: RE | Admit: 2015-08-30 | Discharge: 2015-08-30 | Disposition: A | Discharge status: Home / Self Care | Payer: Commercial Managed Care - HMO | Source: Ambulatory Visit | Attending: Nurse Practitioner | Admitting: Nurse Practitioner

## 2015-08-30 DIAGNOSIS — Z1231 Encounter for screening mammogram for malignant neoplasm of breast: Secondary | ICD-10-CM | POA: Diagnosis not present

## 2015-10-26 DIAGNOSIS — Z0001 Encounter for general adult medical examination with abnormal findings: Secondary | ICD-10-CM | POA: Diagnosis not present

## 2015-10-26 DIAGNOSIS — E1165 Type 2 diabetes mellitus with hyperglycemia: Secondary | ICD-10-CM | POA: Diagnosis not present

## 2015-10-26 DIAGNOSIS — I10 Essential (primary) hypertension: Secondary | ICD-10-CM | POA: Diagnosis not present

## 2015-10-26 DIAGNOSIS — E559 Vitamin D deficiency, unspecified: Secondary | ICD-10-CM | POA: Diagnosis not present

## 2015-10-26 DIAGNOSIS — E782 Mixed hyperlipidemia: Secondary | ICD-10-CM | POA: Diagnosis not present

## 2015-11-16 DIAGNOSIS — E782 Mixed hyperlipidemia: Secondary | ICD-10-CM | POA: Diagnosis not present

## 2015-11-16 DIAGNOSIS — R609 Edema, unspecified: Secondary | ICD-10-CM | POA: Diagnosis not present

## 2015-11-16 DIAGNOSIS — I10 Essential (primary) hypertension: Secondary | ICD-10-CM | POA: Diagnosis not present

## 2015-11-16 DIAGNOSIS — E1140 Type 2 diabetes mellitus with diabetic neuropathy, unspecified: Secondary | ICD-10-CM | POA: Diagnosis not present

## 2015-11-16 DIAGNOSIS — R944 Abnormal results of kidney function studies: Secondary | ICD-10-CM | POA: Diagnosis not present

## 2016-01-15 DIAGNOSIS — N183 Chronic kidney disease, stage 3 (moderate): Secondary | ICD-10-CM | POA: Diagnosis not present

## 2016-01-15 DIAGNOSIS — I10 Essential (primary) hypertension: Secondary | ICD-10-CM | POA: Diagnosis not present

## 2016-01-15 DIAGNOSIS — I129 Hypertensive chronic kidney disease with stage 1 through stage 4 chronic kidney disease, or unspecified chronic kidney disease: Secondary | ICD-10-CM | POA: Diagnosis not present

## 2016-01-15 DIAGNOSIS — E1122 Type 2 diabetes mellitus with diabetic chronic kidney disease: Secondary | ICD-10-CM | POA: Diagnosis not present

## 2016-02-20 DIAGNOSIS — R944 Abnormal results of kidney function studies: Secondary | ICD-10-CM | POA: Diagnosis not present

## 2016-02-29 ENCOUNTER — Ambulatory Visit: Payer: Commercial Managed Care - HMO | Admitting: Family Medicine

## 2016-03-19 DIAGNOSIS — N183 Chronic kidney disease, stage 3 (moderate): Secondary | ICD-10-CM | POA: Diagnosis not present

## 2016-03-19 DIAGNOSIS — R609 Edema, unspecified: Secondary | ICD-10-CM | POA: Diagnosis not present

## 2016-03-19 DIAGNOSIS — I10 Essential (primary) hypertension: Secondary | ICD-10-CM | POA: Diagnosis not present

## 2016-03-19 DIAGNOSIS — E1140 Type 2 diabetes mellitus with diabetic neuropathy, unspecified: Secondary | ICD-10-CM | POA: Diagnosis not present

## 2016-03-19 DIAGNOSIS — K219 Gastro-esophageal reflux disease without esophagitis: Secondary | ICD-10-CM | POA: Diagnosis not present

## 2016-03-22 DIAGNOSIS — N183 Chronic kidney disease, stage 3 (moderate): Secondary | ICD-10-CM | POA: Diagnosis not present

## 2016-03-22 DIAGNOSIS — R600 Localized edema: Secondary | ICD-10-CM | POA: Diagnosis not present

## 2016-03-22 DIAGNOSIS — E1129 Type 2 diabetes mellitus with other diabetic kidney complication: Secondary | ICD-10-CM | POA: Diagnosis not present

## 2016-03-22 DIAGNOSIS — R609 Edema, unspecified: Secondary | ICD-10-CM | POA: Diagnosis not present

## 2016-03-22 DIAGNOSIS — I10 Essential (primary) hypertension: Secondary | ICD-10-CM | POA: Diagnosis not present

## 2016-05-15 DIAGNOSIS — I10 Essential (primary) hypertension: Secondary | ICD-10-CM | POA: Diagnosis not present

## 2016-05-15 DIAGNOSIS — N183 Chronic kidney disease, stage 3 (moderate): Secondary | ICD-10-CM | POA: Diagnosis not present

## 2016-05-15 DIAGNOSIS — E1129 Type 2 diabetes mellitus with other diabetic kidney complication: Secondary | ICD-10-CM | POA: Diagnosis not present

## 2016-05-23 DIAGNOSIS — E113293 Type 2 diabetes mellitus with mild nonproliferative diabetic retinopathy without macular edema, bilateral: Secondary | ICD-10-CM | POA: Diagnosis not present

## 2016-07-03 ENCOUNTER — Ambulatory Visit
Admission: RE | Admit: 2016-07-03 | Discharge: 2016-07-03 | Disposition: A | Discharge status: Home / Self Care | Payer: Medicare HMO | Source: Ambulatory Visit | Attending: Urology | Admitting: Urology

## 2016-07-03 DIAGNOSIS — N200 Calculus of kidney: Secondary | ICD-10-CM | POA: Insufficient documentation

## 2016-07-05 ENCOUNTER — Encounter: Payer: Self-pay | Admitting: Urology

## 2016-07-05 ENCOUNTER — Ambulatory Visit (INDEPENDENT_AMBULATORY_CARE_PROVIDER_SITE_OTHER): Payer: Medicare HMO | Admitting: Urology

## 2016-07-05 VITALS — BP 154/74 | HR 60 | Ht 64.0 in | Wt 223.8 lb

## 2016-07-05 DIAGNOSIS — N183 Chronic kidney disease, stage 3 unspecified: Secondary | ICD-10-CM

## 2016-07-05 DIAGNOSIS — Z87442 Personal history of urinary calculi: Secondary | ICD-10-CM | POA: Diagnosis not present

## 2016-07-05 DIAGNOSIS — N200 Calculus of kidney: Secondary | ICD-10-CM | POA: Diagnosis not present

## 2016-07-05 NOTE — Patient Instructions (Signed)
Uric Acid Nephropathy Uric acid is a chemical compound that is made when your body digests some kinds of food and also when your body breaks down dead cells. It is a waste product that is normally removed from your body by your kidneys. If you have too much uric acid in your blood, it can build up in your kidneys and cause damage (nephropathy). Uric acid nephropathy can be sudden (acute) or long-term (chronic). The acute type results from a sudden buildup of uric acid. This can happen if you have cancer or receive drug treatment for cancer (chemotherapy) that makes you lose cells rapidly. The cell breakdown produces excess uric acid. As uric acid builds up in your kidneys, it causes an increase of pressure and a loss of blood supply. This makes your kidneys less able to filter blood and make urine. Symptoms of acute uric acid nephropathy may start within days of starting chemotherapy. Chronic uric acid nephropathy may happen if you have gout. This disease forms excess uric acid into crystals. These crystals can get stuck inside of joints and cause painful swelling. They may also build up in your kidneys and cause long-term damage. What are the causes? Uric acid nephropathy is caused by having too much uric acid from your diet or from cell breakdown. What increases the risk? You may be at greater risk for uric acid nephropathy if you:  Are female.  Are 53 years old or older.  Take medicine that can cause excess uric acid. Examples are aspirin, water pills, and medicines that are prescribed after an organ transplant.  Eat a lot of foods that are high in certain natural chemical compounds (purines). Shellfish and red meat contain a lot of purines.  Have gout.  Drink too much alcohol.  Are having chemotherapy.  Have frequent seizures.  Have severe diarrhea, which causes fluid loss (dehydration). What are the signs or symptoms? Signs and symptoms depend on the type of nephropathy that you have.  They may include:  Decreased urine output.  Nausea and vomiting.  Lack of energy.  Seizures.  High blood pressure.  Kidney infection.  Blood-tinged urine.  Pain when passing urine.  Pain in the sides of the lower back (flank pain). How is this diagnosed? Your health care provider may suspect uric acid nephropathy from your signs and symptoms, especially if you have gout. A physical exam will be done. Tests may be done to confirm the diagnosis, such as:  Blood and urine tests. This is the best way to measure high levels of uric acid.  Imaging studies to check for kidney stones or kidney damage. These may include:  X-rays.  A test that uses sound waves to create images (ultrasound).  CT scan.  MRI. How is this treated? The goal of treatment is to lower the level of uric acid in your body and prevent kidney damage. This can be done by:  Taking medicines that block uric acid production.  Several medicines can block the production of uric acid as a waste product. The most commonly used medicine is allopurinol.  If you are starting chemotherapy treatment, ask your health care provider if you should start taking a medicine to prevent high uric acid.  Starting a diet plan to lower your intake of purines. Working with a Financial planner, such as a Firefighter, can help you to follow a healthy diet and limit your intake of foods and drinks that increase uric acid.  Preventing uric acid buildup. This can be  done by drinking lots of water to maintain a good flow of urine and lower the acidity of your urine. You may also need to take medicine called bicarbonate.  Resting the kidneys by using a machine to filter your blood (hemodialysis), if necessary.  You may need hemodialysis if your kidneys are not working well because of uric acid damage.  In hemodialysis, your blood is removed, passed through a filtering machine, and then returned to your body.  Several  sessions of hemodialysis usually improve kidney function by removing uric acid. Follow these instructions at home:  Take medicines only as directed by your health care provider.  Drink enough fluid to keep your urine clear or pale yellow.  Do not drink alcohol.  Do not drink beverages that contain the sugar fructose.  Limit how much red meat and shellfish you eat.  Include plenty of low-fat dairy foods in your diet.  Maintain a healthy weight. Lose weight as directed by your health care provider. Contact a health care provider if:  You feel tired and have low energy, even when you get enough sleep.  You have pain when passing urine.  You have nausea or vomiting. Get help right away if:  You produce very little urine, even when you drink enough fluids.  You have blood in your urine.  You have a seizure. This information is not intended to replace advice given to you by your health care provider. Make sure you discuss any questions you have with your health care provider. Document Released: 02/03/2007 Document Revised: 10/27/2015 Document Reviewed: 09/15/2013 Elsevier Interactive Patient Education  2017 [employer].

## 2016-07-05 NOTE — Progress Notes (Signed)
9:48 AM  07/05/16   Kara [name] 1945/05/05 [id number]  Referring provider: Lavera Guise, MD [address] [place], [place] [address]  Chief Complaint  Patient presents with  . Follow-up    RUS results    HPI: 71 yo F with history uric acid nephrolithiasis Who returns today for routine follow-up.  She initially presented with a left 5 mm UPJ stone s/p left ureteroscopy, laser lithotripsy, left ureteral stent placement on 04/14/2015. Marland Kitchen  She returns today with follow-up renal ultrasound which shows no stones bilaterally, bilateral cortical thinning consistent with medical renal disease, no hydronephrosis.  Since her last visit, she had no flank pain or stone episodes. She has also had no gouty episodes. Her uric acid levels were normal but at the time of her stone episode, were elevated.  She does endorse state that in the past, she's had pain in her big toe which comes and goes. She is been careful about taking plenty of water, drinking lemonade, and avoiding high purine foods.    Stone analysis shows her stone is primarily uric acid, 72%.  It was also 20% calcium oxalate monohydrate, 5% calcium oxalate dihydrate, and 3% calcium phosphate.  She has no previous history of stones. She was scheduled to undergo ESWL in the past however her stone was not able to be visualized fluoroscopically consistent with her history of uric acid stones.  Previously underwent 24 hr urine work up. Her total voided Foley was 1.8 L. She did have an elevated urine oxalate at 59 mg per day. Her urinary citrate was also borderline and 492 mg per day. Otherwise, metabolic analysis was normal. Of note, her urine uric acid was normal.  Urinary pH 5.95.   PMH: Past Medical History:  Diagnosis Date  . Arthropathy   . Benign neoplasm of breast   . Chest pain   . Diabetes ([place])   . Diabetic retinopathy ([place])   . Edema   . Hyperlipemia   . Hypersomnia with sleep apnea   . Hypertension   .  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 Kidney stones, calcium oxalate   . Lumbago   . Nephrolithiasis   . Osteoarthrosis   . Ovarian failure   . SOB (shortness of breath)     Surgical History: Past Surgical History:  Procedure Laterality Date  . BREAST BIOPSY Right 2014   neg- core  . BREAST BIOPSY Right 2014   neg  . BREAST DUCTAL SYSTEM EXCISION    . CATARACT EXTRACTION    . CHOLECYSTECTOMY    . CYSTOSCOPY WITH STENT PLACEMENT Left 04/14/2015   Procedure: CYSTOSCOPY WITH STENT PLACEMENT;  Surgeon: Hollice Espy, MD;  Location: [facility];  Service: Urology;  Laterality: Left;  . EXTRACORPOREAL SHOCK WAVE LITHOTRIPSY Left 09/29/2014   Procedure: EXTRACORPOREAL SHOCK WAVE LITHOTRIPSY (ESWL);  Surgeon: Hollice Espy, MD;  Location: [facility];  Service: Urology;  Laterality: Left;  . LAPAROSCOPIC OOPHERECTOMY Left   . TONSILLECTOMY    . URETEROSCOPY WITH HOLMIUM LASER LITHOTRIPSY Left 04/14/2015   Procedure: URETEROSCOPY WITH HOLMIUM LASER LITHOTRIPSY;  Surgeon: Hollice Espy, MD;  Location: [facility];  Service: Urology;  Laterality: Left;    Home Medications:  Allergies as of 07/05/2016   No Known Allergies     Medication List       Accurate as of 07/05/16  9:48 AM. Always use your most recent med list.          aspirin EC 81 MG tablet  Take 81 mg by mouth daily. Reported on 04/07/2015   cetirizine 10 MG tablet Commonly known as:  ZYRTEC Take 10 mg by mouth as needed for allergies. Reported on 05/26/2015   cholecalciferol 1000 units tablet Commonly known as:  VITAMIN D Take 1,000 Units by mouth daily. At 8 pm   glimepiride 4 MG tablet Commonly known as:  AMARYL Take 4 mg by mouth 2 (two) times daily.   hydrALAZINE 10 MG tablet Commonly known as:  APRESOLINE Take 10 mg by mouth 3 (three) times daily.   insulin detemir 100 UNIT/ML injection Commonly known as:  LEVEMIR Inject 75 Units into the skin daily after supper.   lactobacillus acidophilus Tabs  tablet Take 2 tablets by mouth daily at 12 noon. At noon   lisinopril 20 MG tablet Commonly known as:  PRINIVIL,ZESTRIL Take 20 mg by mouth daily.   loperamide 2 MG tablet Commonly known as:  IMODIUM A-D Take 2 mg by mouth as needed for diarrhea or loose stools.   losartan 25 MG tablet Commonly known as:  COZAAR Take 25 mg by mouth daily at 12 noon.   multivitamin tablet Take 1 tablet by mouth daily at 12 noon.   NEOMYCIN-POLYMYXIN-HYDROCORTISONE 1 % Soln otic solution Commonly known as:  CORTISPORIN Reported on 05/26/2015   pantoprazole 40 MG tablet Commonly known as:  PROTONIX Take 40 mg by mouth daily.   pregabalin 100 MG capsule Commonly known as:  LYRICA Take 100 mg by mouth 2 (two) times daily.   rosuvastatin 20 MG tablet Commonly known as:  CRESTOR Take 20 mg by mouth at bedtime.   traMADol-acetaminophen 37.5-325 MG tablet Commonly known as:  ULTRACET Take 1 tablet by mouth every evening.   TRULICITY 1.5 BW/3.8LH Sopn Generic drug:  Dulaglutide   verapamil 40 MG tablet Commonly known as:  CALAN Take 40 mg by mouth 2 (two) times daily.       Allergies: No Known Allergies  Family History: Family History  Problem Relation Age of Onset  . Prostate cancer Neg Hx   . Bladder Cancer Neg Hx   . Kidney cancer Neg Hx   . Breast cancer Maternal Grandmother 88    Social History:  reports that she quit smoking about 21 years ago. She has never used smokeless tobacco. She reports that she does not drink alcohol or use drugs.   Physical Exam: BP (!) 154/74 (BP Location: Left Arm, Patient Position: Sitting, Cuff Size: Large)   Pulse 60   Ht 5\' 4"  (1.626 m)   Wt 223 lb 12.8 oz (101.5 kg)   BMI 38.42 kg/m   Constitutional:  Alert and oriented, No acute distress. HEENT: [place] AT, moist mucus membranes.  Trachea midline, no masses. Cardiovascular: No clubbing, cyanosis, or edema. Respiratory: Normal respiratory effort, no increased work of breathing.  Skin: No  rashes, bruises or suspicious lesions. Neurologic: Grossly intact, no focal deficits, moving all 4 extremities. Psychiatric: Normal mood and affect.  Laboratory Data: Cr 1.3 on 04/09/2015   Results for orders placed or performed in visit on 07/05/15  Uric acid  Result Value Ref Range   Uric Acid 4.9 2.5 - 7.1 mg/dL   Imaging: CLINICAL DATA:  Nephrolithiasis  EXAM: RENAL / URINARY TRACT ULTRASOUND COMPLETE  COMPARISON:  May 17, 2015  FINDINGS: Right Kidney:  Length: 9.2 cm. Echogenicity within normal limits. Renal cortical thickness on the is mildly decreased. No mass, perinephric fluid, or hydronephrosis visualized. No sonographically demonstrable calculus or ureterectasis.  Left  Kidney:  Length: 9.8 cm. Echogenicity within normal limits. Renal cortical thickness is [name] normal. No mass, perinephric fluid, or hydronephrosis visualized. No sonographically demonstrable calculus or ureterectasis.  Bladder:  Appears normal for degree of bladder distention.  IMPRESSION: Renal cortical thickness on the left is [name] normal. There is mild renal cortical thinning on the right. This finding may be seen with age or with a degree of medical renal disease. Renal echogenicity is normal bilaterally. No obstructing foci on either side. Study otherwise unremarkable.   Electronically Signed   By: Lowella Grip III M.D.   On: 07/03/2016 12:36  Renal ultrasound personally reviewed today.  Assessment & Plan:   1. Uric acid nephrolithiasis/ metabolic kidney stone disease Reviewed stone diet precautions, additional information about uric acid stone prevention provided today  2. Right kidney stone Not appreciated on most recent renal ultrasound, asymptomatic  3. Chronic kidney disease, stage III  Recommend take control of diabetes, hypertension, and weight control Renal ultrasound results reviewed, bilateral medical renal disease Advised to follow up with  PCP   Return if symptoms worsen or fail to improve.    Hollice Espy, MD  [facility] [address], [place], [place] [address] [phone]

## 2016-07-22 DIAGNOSIS — I10 Essential (primary) hypertension: Secondary | ICD-10-CM | POA: Diagnosis not present

## 2016-07-22 DIAGNOSIS — E1122 Type 2 diabetes mellitus with diabetic chronic kidney disease: Secondary | ICD-10-CM | POA: Diagnosis not present

## 2016-07-22 DIAGNOSIS — M792 Neuralgia and neuritis, unspecified: Secondary | ICD-10-CM | POA: Diagnosis not present

## 2016-07-22 DIAGNOSIS — K439 Ventral hernia without obstruction or gangrene: Secondary | ICD-10-CM | POA: Diagnosis not present

## 2016-07-22 DIAGNOSIS — E782 Mixed hyperlipidemia: Secondary | ICD-10-CM | POA: Diagnosis not present

## 2016-07-22 DIAGNOSIS — Z0001 Encounter for general adult medical examination with abnormal findings: Secondary | ICD-10-CM | POA: Diagnosis not present

## 2016-07-30 ENCOUNTER — Other Ambulatory Visit: Payer: Self-pay | Admitting: Nurse Practitioner

## 2016-07-30 DIAGNOSIS — Z1231 Encounter for screening mammogram for malignant neoplasm of breast: Secondary | ICD-10-CM

## 2016-07-30 DIAGNOSIS — E2839 Other primary ovarian failure: Secondary | ICD-10-CM

## 2016-08-13 DIAGNOSIS — E1129 Type 2 diabetes mellitus with other diabetic kidney complication: Secondary | ICD-10-CM | POA: Diagnosis not present

## 2016-08-13 DIAGNOSIS — N183 Chronic kidney disease, stage 3 (moderate): Secondary | ICD-10-CM | POA: Diagnosis not present

## 2016-08-13 DIAGNOSIS — I10 Essential (primary) hypertension: Secondary | ICD-10-CM | POA: Diagnosis not present

## 2016-08-14 DIAGNOSIS — L853 Xerosis cutis: Secondary | ICD-10-CM | POA: Diagnosis not present

## 2016-08-14 DIAGNOSIS — L821 Other seborrheic keratosis: Secondary | ICD-10-CM | POA: Diagnosis not present

## 2016-08-14 DIAGNOSIS — L0889 Other specified local infections of the skin and subcutaneous tissue: Secondary | ICD-10-CM | POA: Diagnosis not present

## 2016-08-14 DIAGNOSIS — L814 Other melanin hyperpigmentation: Secondary | ICD-10-CM | POA: Diagnosis not present

## 2016-08-14 DIAGNOSIS — X32XXXA Exposure to sunlight, initial encounter: Secondary | ICD-10-CM | POA: Diagnosis not present

## 2016-08-14 DIAGNOSIS — B078 Other viral warts: Secondary | ICD-10-CM | POA: Diagnosis not present

## 2016-08-16 DIAGNOSIS — N183 Chronic kidney disease, stage 3 (moderate): Secondary | ICD-10-CM | POA: Diagnosis not present

## 2016-08-16 DIAGNOSIS — I10 Essential (primary) hypertension: Secondary | ICD-10-CM | POA: Diagnosis not present

## 2016-08-16 DIAGNOSIS — E1129 Type 2 diabetes mellitus with other diabetic kidney complication: Secondary | ICD-10-CM | POA: Diagnosis not present

## 2016-09-17 DIAGNOSIS — B078 Other viral warts: Secondary | ICD-10-CM | POA: Diagnosis not present

## 2016-09-17 DIAGNOSIS — L298 Other pruritus: Secondary | ICD-10-CM | POA: Diagnosis not present

## 2016-09-17 DIAGNOSIS — L538 Other specified erythematous conditions: Secondary | ICD-10-CM | POA: Diagnosis not present

## 2016-09-25 ENCOUNTER — Ambulatory Visit
Admission: RE | Admit: 2016-09-25 | Discharge: 2016-09-25 | Disposition: A | Discharge status: Home / Self Care | Payer: Medicare HMO | Source: Ambulatory Visit | Attending: Nurse Practitioner | Admitting: Nurse Practitioner

## 2016-09-25 DIAGNOSIS — E2839 Other primary ovarian failure: Secondary | ICD-10-CM | POA: Insufficient documentation

## 2016-09-25 DIAGNOSIS — M8589 Other specified disorders of bone density and structure, multiple sites: Secondary | ICD-10-CM | POA: Diagnosis not present

## 2016-09-25 DIAGNOSIS — M8588 Other specified disorders of bone density and structure, other site: Secondary | ICD-10-CM | POA: Insufficient documentation

## 2016-09-25 DIAGNOSIS — Z1231 Encounter for screening mammogram for malignant neoplasm of breast: Secondary | ICD-10-CM | POA: Diagnosis not present

## 2016-11-11 DIAGNOSIS — E1129 Type 2 diabetes mellitus with other diabetic kidney complication: Secondary | ICD-10-CM | POA: Diagnosis not present

## 2016-11-11 DIAGNOSIS — N183 Chronic kidney disease, stage 3 (moderate): Secondary | ICD-10-CM | POA: Diagnosis not present

## 2016-11-11 DIAGNOSIS — I10 Essential (primary) hypertension: Secondary | ICD-10-CM | POA: Diagnosis not present

## 2016-11-11 DIAGNOSIS — R600 Localized edema: Secondary | ICD-10-CM | POA: Diagnosis not present

## 2016-11-26 DIAGNOSIS — R609 Edema, unspecified: Secondary | ICD-10-CM | POA: Diagnosis not present

## 2016-11-26 DIAGNOSIS — M792 Neuralgia and neuritis, unspecified: Secondary | ICD-10-CM | POA: Diagnosis not present

## 2016-11-26 DIAGNOSIS — E1122 Type 2 diabetes mellitus with diabetic chronic kidney disease: Secondary | ICD-10-CM | POA: Diagnosis not present

## 2016-11-26 DIAGNOSIS — I10 Essential (primary) hypertension: Secondary | ICD-10-CM | POA: Diagnosis not present

## 2016-11-26 DIAGNOSIS — E782 Mixed hyperlipidemia: Secondary | ICD-10-CM | POA: Diagnosis not present

## 2017-03-10 DIAGNOSIS — R600 Localized edema: Secondary | ICD-10-CM | POA: Diagnosis not present

## 2017-03-10 DIAGNOSIS — E1122 Type 2 diabetes mellitus with diabetic chronic kidney disease: Secondary | ICD-10-CM | POA: Diagnosis not present

## 2017-03-10 DIAGNOSIS — N183 Chronic kidney disease, stage 3 (moderate): Secondary | ICD-10-CM | POA: Diagnosis not present

## 2017-03-10 DIAGNOSIS — E1129 Type 2 diabetes mellitus with other diabetic kidney complication: Secondary | ICD-10-CM | POA: Diagnosis not present

## 2017-03-10 DIAGNOSIS — I10 Essential (primary) hypertension: Secondary | ICD-10-CM | POA: Diagnosis not present

## 2017-03-27 DIAGNOSIS — R251 Tremor, unspecified: Secondary | ICD-10-CM | POA: Diagnosis not present

## 2017-03-27 DIAGNOSIS — E1122 Type 2 diabetes mellitus with diabetic chronic kidney disease: Secondary | ICD-10-CM | POA: Diagnosis not present

## 2017-03-27 DIAGNOSIS — M792 Neuralgia and neuritis, unspecified: Secondary | ICD-10-CM | POA: Diagnosis not present

## 2017-03-27 DIAGNOSIS — E212 Other hyperparathyroidism: Secondary | ICD-10-CM | POA: Diagnosis not present

## 2017-03-27 DIAGNOSIS — I10 Essential (primary) hypertension: Secondary | ICD-10-CM | POA: Diagnosis not present

## 2017-03-27 DIAGNOSIS — R609 Edema, unspecified: Secondary | ICD-10-CM | POA: Diagnosis not present

## 2017-03-27 DIAGNOSIS — E782 Mixed hyperlipidemia: Secondary | ICD-10-CM | POA: Diagnosis not present

## 2017-04-23 DIAGNOSIS — N183 Chronic kidney disease, stage 3 (moderate): Secondary | ICD-10-CM | POA: Diagnosis not present

## 2017-04-23 DIAGNOSIS — E1129 Type 2 diabetes mellitus with other diabetic kidney complication: Secondary | ICD-10-CM | POA: Diagnosis not present

## 2017-04-23 DIAGNOSIS — I10 Essential (primary) hypertension: Secondary | ICD-10-CM | POA: Diagnosis not present

## 2017-04-23 DIAGNOSIS — R600 Localized edema: Secondary | ICD-10-CM | POA: Diagnosis not present

## 2017-05-02 DIAGNOSIS — R600 Localized edema: Secondary | ICD-10-CM | POA: Diagnosis not present

## 2017-05-02 DIAGNOSIS — I10 Essential (primary) hypertension: Secondary | ICD-10-CM | POA: Diagnosis not present

## 2017-05-02 DIAGNOSIS — N184 Chronic kidney disease, stage 4 (severe): Secondary | ICD-10-CM | POA: Diagnosis not present

## 2017-05-02 DIAGNOSIS — E1129 Type 2 diabetes mellitus with other diabetic kidney complication: Secondary | ICD-10-CM | POA: Diagnosis not present

## 2017-05-12 ENCOUNTER — Other Ambulatory Visit: Payer: Self-pay | Admitting: Nurse Practitioner

## 2017-05-12 ENCOUNTER — Ambulatory Visit (INDEPENDENT_AMBULATORY_CARE_PROVIDER_SITE_OTHER): Payer: Medicare HMO

## 2017-05-12 DIAGNOSIS — E212 Other hyperparathyroidism: Secondary | ICD-10-CM | POA: Diagnosis not present

## 2017-05-12 DIAGNOSIS — E1142 Type 2 diabetes mellitus with diabetic polyneuropathy: Secondary | ICD-10-CM

## 2017-05-12 MED ORDER — PREGABALIN 100 MG PO CAPS: 100.0000 mg | ORAL_CAPSULE | Freq: Three times a day (TID) | ORAL | 2 refills | Status: DC

## 2017-05-12 NOTE — Progress Notes (Signed)
Sent in new rx for lyrica 100mg  TID to [facility] in [place].

## 2017-05-14 DIAGNOSIS — E113293 Type 2 diabetes mellitus with mild nonproliferative diabetic retinopathy without macular edema, bilateral: Secondary | ICD-10-CM | POA: Diagnosis not present

## 2017-06-24 DIAGNOSIS — E113293 Type 2 diabetes mellitus with mild nonproliferative diabetic retinopathy without macular edema, bilateral: Secondary | ICD-10-CM | POA: Diagnosis not present

## 2017-07-25 DIAGNOSIS — R600 Localized edema: Secondary | ICD-10-CM | POA: Diagnosis not present

## 2017-07-25 DIAGNOSIS — N184 Chronic kidney disease, stage 4 (severe): Secondary | ICD-10-CM | POA: Diagnosis not present

## 2017-07-25 DIAGNOSIS — E1129 Type 2 diabetes mellitus with other diabetic kidney complication: Secondary | ICD-10-CM | POA: Diagnosis not present

## 2017-07-25 DIAGNOSIS — I10 Essential (primary) hypertension: Secondary | ICD-10-CM | POA: Diagnosis not present

## 2017-07-28 ENCOUNTER — Encounter: Payer: Self-pay | Admitting: Nurse Practitioner

## 2017-07-28 ENCOUNTER — Ambulatory Visit: Payer: Medicare HMO | Admitting: Nurse Practitioner

## 2017-07-28 VITALS — BP 144/67 | HR 55 | Resp 16 | Ht 62.0 in | Wt 224.8 lb

## 2017-07-28 DIAGNOSIS — Z6841 Body Mass Index (BMI) 40.0 and over, adult: Secondary | ICD-10-CM | POA: Diagnosis not present

## 2017-07-28 DIAGNOSIS — E1140 Type 2 diabetes mellitus with diabetic neuropathy, unspecified: Secondary | ICD-10-CM | POA: Diagnosis not present

## 2017-07-28 DIAGNOSIS — E11649 Type 2 diabetes mellitus with hypoglycemia without coma: Secondary | ICD-10-CM

## 2017-07-28 DIAGNOSIS — R300 Dysuria: Secondary | ICD-10-CM

## 2017-07-28 DIAGNOSIS — B372 Candidiasis of skin and nail: Secondary | ICD-10-CM

## 2017-07-28 DIAGNOSIS — Z1239 Encounter for other screening for malignant neoplasm of breast: Secondary | ICD-10-CM

## 2017-07-28 LAB — POCT GLYCOSYLATED HEMOGLOBIN (HGB A1C): Hemoglobin A1C: 7.4

## 2017-07-28 MED ORDER — TRAMADOL-ACETAMINOPHEN 37.5-325 MG PO TABS: 1.0000 | ORAL_TABLET | Freq: Two times a day (BID) | ORAL | 3 refills | Status: DC | PRN

## 2017-07-28 MED ORDER — NYSTATIN 100000 UNIT/GM EX CREA: 1.0000 "application " | TOPICAL_CREAM | Freq: Two times a day (BID) | CUTANEOUS | 3 refills | Status: DC

## 2017-07-28 NOTE — Progress Notes (Signed)
[facility] [place], [place] [address]  Internal MEDICINE  Office Visit Note  Patient Name: Kara [name]  [id number]  [id number]  Date of Service: 07/28/2017  Chief Complaint  Patient presents with  . Annual Exam     The patient is here for health maintenance exam. She states that blood sugars are doing ok. She does eat a lot of bread. Discussed with kidney doctor when seen. He suggested a low carbohydrate diet. He also took a great deal of blood for routine blood work. We will have to request progress notes from Dr. Candiss Norse to have on record.  She has been having an issue with her tramadol. Takes this twice daily as needed, though she generally takes this only once daily. Takes this along with Lyrica for diabetic neuropathy and has been on same dosing for several years. Pharmacy has started filling this for only 14 days at a time. This is not first time prescription. This is chronic and the patient is assessed every three to four months for continued prescriptions.   Pt is here for routine health maintenance examination  Current Medication: Outpatient Encounter Medications as of 07/28/2017  Medication Sig Note  . aspirin EC 81 MG tablet Take 81 mg by mouth daily. Reported on 04/07/2015   . cetirizine (ZYRTEC) 10 MG tablet Take 10 mg by mouth as needed for allergies. Reported on 05/26/2015   . cholecalciferol (VITAMIN D) 1000 UNITS tablet Take 1,000 Units by mouth daily. At 8 pm   . furosemide (LASIX) 20 MG tablet Take 20 mg by mouth 2 (two) times daily as needed.   Marland Kitchen glimepiride (AMARYL) 4 MG tablet Take 4 mg by mouth 2 (two) times daily.   . insulin detemir (LEVEMIR) 100 UNIT/ML injection Inject 75 Units into the skin daily after supper.   Marland Kitchen lisinopril (PRINIVIL,ZESTRIL) 20 MG tablet Take 20 mg by mouth daily.   Marland Kitchen loperamide (IMODIUM A-D) 2 MG tablet Take 2 mg by mouth as needed for diarrhea or loose stools.   . Multiple Vitamin (MULTIVITAMIN) tablet Take 1  tablet by mouth daily at 12 noon.   . NEOMYCIN-POLYMYXIN-HYDROCORTISONE (CORTISPORIN) 1 % SOLN otic solution Reported on 05/26/2015 05/26/2015: Received from: External Pharmacy  . pantoprazole (PROTONIX) 40 MG tablet Take 40 mg by mouth daily.   . pregabalin (LYRICA) 100 MG capsule Take 1 capsule (100 mg total) by mouth 3 (three) times daily.   . rosuvastatin (CRESTOR) 20 MG tablet Take 20 mg by mouth at bedtime.    . traMADol-acetaminophen (ULTRACET) 37.5-325 MG per tablet Take 1 tablet by mouth every evening.   . TRULICITY 1.5 QI/6.9GE SOPN  05/26/2015: Received from: External Pharmacy  . verapamil (CALAN) 40 MG tablet Take 40 mg by mouth 2 (two) times daily.   . [DISCONTINUED] hydrALAZINE (APRESOLINE) 10 MG tablet Take 10 mg by mouth 3 (three) times daily. 09/29/2014: 36 units  . [DISCONTINUED] lactobacillus acidophilus (BACID) TABS tablet Take 2 tablets by mouth daily at 12 noon. At noon   . [DISCONTINUED] losartan (COZAAR) 25 MG tablet Take 25 mg by mouth daily at 12 noon.     No facility-administered encounter medications on file as of 07/28/2017.     Surgical History: Past Surgical History:  Procedure Laterality Date  . BREAST BIOPSY Right 2014   neg- core  . BREAST BIOPSY Right 2014   neg  . BREAST DUCTAL SYSTEM EXCISION    . CATARACT EXTRACTION    . CHOLECYSTECTOMY    .  CYSTOSCOPY WITH STENT PLACEMENT Left 04/14/2015   Procedure: CYSTOSCOPY WITH STENT PLACEMENT;  Surgeon: Hollice Espy, MD;  Location: [facility];  Service: Urology;  Laterality: Left;  . EXTRACORPOREAL SHOCK WAVE LITHOTRIPSY Left 09/29/2014   Procedure: EXTRACORPOREAL SHOCK WAVE LITHOTRIPSY (ESWL);  Surgeon: Hollice Espy, MD;  Location: [facility];  Service: Urology;  Laterality: Left;  . LAPAROSCOPIC OOPHERECTOMY Left   . TONSILLECTOMY    . URETEROSCOPY WITH HOLMIUM LASER LITHOTRIPSY Left 04/14/2015   Procedure: URETEROSCOPY WITH HOLMIUM LASER LITHOTRIPSY;  Surgeon: Hollice Espy, MD;  Location: [facility];  Service:  Urology;  Laterality: Left;    Medical History: Past Medical History:  Diagnosis Date  . Arthropathy   . Benign neoplasm of breast   . Chest pain   . Diabetes ([place])   . Diabetic retinopathy ([place])   . Edema   . Hyperlipemia   . Hypersomnia with sleep apnea   . Hypertension   . 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 Kidney stones, calcium oxalate   . Lumbago   . Nephrolithiasis   . Osteoarthrosis   . Ovarian failure   . SOB (shortness of breath)     Family History: Family History  Problem Relation Age of Onset  . Breast cancer Maternal Grandmother 47  . Colon cancer Mother       Review of Systems  Constitutional: Negative for activity change, chills, fatigue and unexpected weight change.  HENT: Positive for postnasal drip. Negative for congestion, rhinorrhea, sneezing and sore throat.   Eyes: Negative for redness.  Respiratory: Negative for cough, chest tightness and shortness of breath.   Cardiovascular: Negative for chest pain and palpitations.  Gastrointestinal: Negative for abdominal pain, constipation, diarrhea, nausea and vomiting.  Genitourinary: Negative for dysuria and frequency.  Musculoskeletal: Negative for arthralgias, back pain, joint swelling and neck pain.  Skin: Negative for rash.  Neurological: Negative.  Negative for tremors and numbness.  Hematological: Negative for adenopathy. Does not bruise/bleed easily.  Psychiatric/Behavioral: Negative for behavioral problems (Depression), sleep disturbance and suicidal ideas. The patient is not nervous/anxious.      Today's Vitals   07/28/17 0954  BP: (!) 144/67  Pulse: (!) 55  Resp: 16  SpO2: 99%  Weight: 224 lb 12.8 oz (102 kg)  Height: 5\' 2"  (1.575 m)    Physical Exam       Assessment/Plan:   General Counseling: Beila verbalizes understanding of the findings of todays visit and agrees with plan of treatment. I have discussed any further diagnostic evaluation that may be needed or  ordered today. We also reviewed her medications today. she has been encouraged to call the office with any questions or concerns that should arise related to todays visit.    Counseling:    Orders Placed This Encounter  Procedures  . Urinalysis, Routine w reflex microscopic  . POCT HgB A1C    No orders of the defined types were placed in this encounter.   Time spent: [place], MD  Internal Medicine

## 2017-07-29 LAB — URINALYSIS, ROUTINE W REFLEX MICROSCOPIC
BILIRUBIN UA: NEGATIVE
Glucose, UA: NEGATIVE
Ketones, UA: NEGATIVE
LEUKOCYTES UA: NEGATIVE
Nitrite, UA: NEGATIVE
PH UA: 5.5 (ref 5.0–7.5)
PROTEIN UA: NEGATIVE
RBC UA: NEGATIVE
Specific Gravity, UA: 1.012 (ref 1.005–1.030)
Urobilinogen, Ur: 0.2 mg/dL (ref 0.2–1.0)

## 2017-08-05 DIAGNOSIS — E113293 Type 2 diabetes mellitus with mild nonproliferative diabetic retinopathy without macular edema, bilateral: Secondary | ICD-10-CM | POA: Diagnosis not present

## 2017-08-12 ENCOUNTER — Other Ambulatory Visit: Payer: Self-pay | Admitting: Internal Medicine

## 2017-09-26 ENCOUNTER — Ambulatory Visit
Admission: RE | Admit: 2017-09-26 | Discharge: 2017-09-26 | Disposition: A | Discharge status: Home / Self Care | Payer: Medicare HMO | Source: Ambulatory Visit | Attending: Nurse Practitioner | Admitting: Nurse Practitioner

## 2017-09-26 DIAGNOSIS — Z1231 Encounter for screening mammogram for malignant neoplasm of breast: Secondary | ICD-10-CM | POA: Diagnosis not present

## 2017-09-26 DIAGNOSIS — Z1239 Encounter for other screening for malignant neoplasm of breast: Secondary | ICD-10-CM

## 2017-09-29 ENCOUNTER — Other Ambulatory Visit: Payer: Self-pay | Admitting: Nurse Practitioner

## 2017-09-29 ENCOUNTER — Telehealth: Payer: Self-pay

## 2017-09-29 DIAGNOSIS — E1142 Type 2 diabetes mellitus with diabetic polyneuropathy: Secondary | ICD-10-CM

## 2017-09-29 MED ORDER — PREGABALIN 100 MG PO CAPS: 100.0000 mg | ORAL_CAPSULE | Freq: Three times a day (TID) | ORAL | 2 refills | Status: DC

## 2017-09-29 NOTE — Telephone Encounter (Signed)
Pt was notified.  

## 2017-09-29 NOTE — Telephone Encounter (Signed)
Approved new rx for lyrica 100mg  TID for neuropathy. Will see patient back 11/2017

## 2017-09-29 NOTE — Progress Notes (Signed)
Approved new rx for lyrica 100mg  TID for neuropathy. Will see patient back 11/2017

## 2017-10-13 ENCOUNTER — Other Ambulatory Visit: Payer: Self-pay

## 2017-10-13 MED ORDER — TRULICITY 1.5 MG/0.5ML ~~LOC~~ SOAJ: SUBCUTANEOUS | 5 refills | Status: DC

## 2017-10-29 DIAGNOSIS — E1129 Type 2 diabetes mellitus with other diabetic kidney complication: Secondary | ICD-10-CM | POA: Diagnosis not present

## 2017-10-29 DIAGNOSIS — R600 Localized edema: Secondary | ICD-10-CM | POA: Diagnosis not present

## 2017-10-29 DIAGNOSIS — N184 Chronic kidney disease, stage 4 (severe): Secondary | ICD-10-CM | POA: Diagnosis not present

## 2017-10-29 DIAGNOSIS — I10 Essential (primary) hypertension: Secondary | ICD-10-CM | POA: Diagnosis not present

## 2017-11-03 DIAGNOSIS — E113293 Type 2 diabetes mellitus with mild nonproliferative diabetic retinopathy without macular edema, bilateral: Secondary | ICD-10-CM | POA: Diagnosis not present

## 2017-11-25 ENCOUNTER — Encounter: Payer: Self-pay | Admitting: Nurse Practitioner

## 2017-11-25 ENCOUNTER — Ambulatory Visit (INDEPENDENT_AMBULATORY_CARE_PROVIDER_SITE_OTHER): Payer: Medicare HMO | Admitting: Nurse Practitioner

## 2017-11-25 VITALS — BP 134/59 | HR 53 | Resp 16 | Ht 64.0 in | Wt 221.6 lb

## 2017-11-25 DIAGNOSIS — K580 Irritable bowel syndrome with diarrhea: Secondary | ICD-10-CM

## 2017-11-25 DIAGNOSIS — E1142 Type 2 diabetes mellitus with diabetic polyneuropathy: Secondary | ICD-10-CM

## 2017-11-25 DIAGNOSIS — E1165 Type 2 diabetes mellitus with hyperglycemia: Secondary | ICD-10-CM | POA: Diagnosis not present

## 2017-11-25 DIAGNOSIS — E782 Mixed hyperlipidemia: Secondary | ICD-10-CM

## 2017-11-25 DIAGNOSIS — I10 Essential (primary) hypertension: Secondary | ICD-10-CM

## 2017-11-25 DIAGNOSIS — E119 Type 2 diabetes mellitus without complications: Secondary | ICD-10-CM | POA: Diagnosis not present

## 2017-11-25 DIAGNOSIS — IMO0002 Reserved for concepts with insufficient information to code with codable children: Secondary | ICD-10-CM

## 2017-11-25 LAB — POCT GLYCOSYLATED HEMOGLOBIN (HGB A1C): Hemoglobin A1C: 7.1 % — AB (ref 4.0–5.6)

## 2017-11-25 MED ORDER — DICYCLOMINE HCL 10 MG PO CAPS: 10.0000 mg | ORAL_CAPSULE | Freq: Three times a day (TID) | ORAL | 2 refills | Status: DC | PRN

## 2017-11-25 NOTE — Progress Notes (Signed)
[facility] [place], [place] [address]  Internal MEDICINE  Office Visit Note  Patient Name: Kara [name]  [id number]  [id number]  Date of Service: 12/07/2017  Chief Complaint  Patient presents with  . Diabetes    4 month follow up  . Quality Metric Gaps    pnuemonia shot overdue  . Diarrhea    intermittent episodes, two to three times per week.     Diabetes  She presents for her follow-up diabetic visit. She has type 2 diabetes mellitus. No MedicAlert identification noted. Her disease course has been stable. Hypoglycemia symptoms include nervousness/anxiousness. Pertinent negatives for hypoglycemia include no dizziness, headaches or tremors. Associated symptoms include fatigue and foot paresthesias. Pertinent negatives for diabetes include no chest pain, no polydipsia, no polyphagia and no polyuria. Symptoms are improving. Diabetic complications include peripheral neuropathy. Risk factors for coronary artery disease include dyslipidemia, diabetes mellitus, hypertension, post-menopausal and obesity. Current diabetic treatment includes insulin injections and oral agent (monotherapy). She is compliant with treatment all of the time. Her weight is stable. She is following a generally healthy diet. Meal planning includes avoidance of concentrated sweets. She has not had a previous visit with a dietitian. She participates in exercise intermittently. Her home blood glucose trend is decreasing steadily. An ACE inhibitor/angiotensin II receptor blocker is being taken. She does not see a podiatrist.Eye exam is current.  Diarrhea   This is a chronic problem. The current episode started more than 1 year ago. The problem occurs 5 to 10 times per day. The problem has been waxing and waning. The stool consistency is described as watery. The patient states that diarrhea does not awaken her from sleep. Associated symptoms include bloating. Pertinent negatives include no abdominal  pain, arthralgias, chills, coughing, headaches, myalgias or vomiting. The symptoms are aggravated by stress. There are no known risk factors. She has tried increased fluids, change of diet and anti-motility drug for the symptoms. The treatment provided mild relief. Her past medical history is significant for irritable bowel syndrome.       Current Medication: Outpatient Encounter Medications as of 11/25/2017  Medication Sig Note  . aspirin EC 81 MG tablet Take 81 mg by mouth daily. Reported on 04/07/2015   . cetirizine (ZYRTEC) 10 MG tablet Take 10 mg by mouth as needed for allergies. Reported on 05/26/2015   . cholecalciferol (VITAMIN D) 1000 UNITS tablet Take 1,000 Units by mouth daily. At 8 pm   . furosemide (LASIX) 20 MG tablet Take 20 mg by mouth 2 (two) times daily as needed.   Marland Kitchen glimepiride (AMARYL) 4 MG tablet Take 4 mg by mouth 2 (two) times daily.   . insulin detemir (LEVEMIR) 100 UNIT/ML injection Inject 75 Units into the skin daily after supper.   Marland Kitchen lisinopril (PRINIVIL,ZESTRIL) 20 MG tablet Take 20 mg by mouth daily.   Marland Kitchen loperamide (IMODIUM A-D) 2 MG tablet Take 2 mg by mouth as needed for diarrhea or loose stools.   . Multiple Vitamin (MULTIVITAMIN) tablet Take 1 tablet by mouth daily at 12 noon.   . NEOMYCIN-POLYMYXIN-HYDROCORTISONE (CORTISPORIN) 1 % SOLN otic solution Reported on 05/26/2015 05/26/2015: Received from: External Pharmacy  . nystatin cream (MYCOSTATIN) Apply 1 application topically 2 (two) times daily.   . pantoprazole (PROTONIX) 40 MG tablet Take 40 mg by mouth daily.   . pregabalin (LYRICA) 100 MG capsule Take 1 capsule (100 mg total) by mouth 3 (three) times daily.   . rosuvastatin (CRESTOR) 20 MG tablet  Take 20 mg by mouth at bedtime.    . traMADol-acetaminophen (ULTRACET) 37.5-325 MG tablet Take 1 tablet by mouth 2 (two) times daily as needed for moderate pain.   . TRULICITY 1.5 XM/4.6OE SOPN Inject 1.5mg  [place] once weekly   . verapamil (CALAN) 40 MG tablet TAKE 1  TABLET(S) BY MOUTH TWICE A DAY FOR HYPERTENSION   . dicyclomine (BENTYL) 10 MG capsule Take 1 capsule (10 mg total) by mouth 3 (three) times daily as needed for spasms.    No facility-administered encounter medications on file as of 11/25/2017.     Surgical History: Past Surgical History:  Procedure Laterality Date  . BREAST BIOPSY Right 2014   neg- core  . BREAST BIOPSY Right 2014   neg  . BREAST DUCTAL SYSTEM EXCISION    . CATARACT EXTRACTION    . CHOLECYSTECTOMY    . CYSTOSCOPY WITH STENT PLACEMENT Left 04/14/2015   Procedure: CYSTOSCOPY WITH STENT PLACEMENT;  Surgeon: Hollice Espy, MD;  Location: [facility] ORS;  Service: Urology;  Laterality: Left;  . EXTRACORPOREAL SHOCK WAVE LITHOTRIPSY Left 09/29/2014   Procedure: EXTRACORPOREAL SHOCK WAVE LITHOTRIPSY (ESWL);  Surgeon: Hollice Espy, MD;  Location: [facility] ORS;  Service: Urology;  Laterality: Left;  . LAPAROSCOPIC OOPHERECTOMY Left   . TONSILLECTOMY    . URETEROSCOPY WITH HOLMIUM LASER LITHOTRIPSY Left 04/14/2015   Procedure: URETEROSCOPY WITH HOLMIUM LASER LITHOTRIPSY;  Surgeon: Hollice Espy, MD;  Location: [facility];  Service: Urology;  Laterality: Left;    Medical History: Past Medical History:  Diagnosis Date  . Arthropathy   . Benign neoplasm of breast   . Chest pain   . Diabetes ([place])   . Diabetic retinopathy ([place])   . Edema   . Hyperlipemia   . Hypersomnia with sleep apnea   . Hypertension   . 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 Kidney stones, calcium oxalate   . Lumbago   . Nephrolithiasis   . Osteoarthrosis   . Ovarian failure   . SOB (shortness of breath)     Family History: Family History  Problem Relation Age of Onset  . Breast cancer Maternal Grandmother 35  . Colon cancer Mother     Social History   Socioeconomic History  . Marital status: Married    Spouse name: Not on file  . Number of children: Not on file  . Years of education: Not on file  . Highest education level: Not on file   Occupational History  . Not on file  Social Needs  . Financial resource strain: Not on file  . Food insecurity:    Worry: Not on file    Inability: Not on file  . Transportation needs:    Medical: Not on file    Non-medical: Not on file  Tobacco Use  . Smoking status: Former Smoker    Last attempt to quit: 04/11/1995    Years since quitting: 22.6  . Smokeless tobacco: Never Used  Substance and Sexual Activity  . Alcohol use: No    Alcohol/week: 0.0 standard drinks  . Drug use: No  . Sexual activity: Not on file  Lifestyle  . Physical activity:    Days per week: Not on file    Minutes per session: Not on file  . Stress: Not on file  Relationships  . Social connections:    Talks on phone: Not on file    Gets together: Not on file    Attends religious service: Not on file  Active member of club or organization: Not on file    Attends meetings of clubs or organizations: Not on file    Relationship status: Not on file  . Intimate partner violence:    Fear of current or ex partner: Not on file    Emotionally abused: Not on file    Physically abused: Not on file    Forced sexual activity: Not on file  Other Topics Concern  . Not on file  Social History Narrative  . Not on file      Review of Systems  Constitutional: Positive for fatigue. Negative for activity change, chills and unexpected weight change.  HENT: Negative for congestion, postnasal drip, rhinorrhea, sneezing and sore throat.   Eyes: Negative.  Negative for redness.  Respiratory: Negative for cough, chest tightness and shortness of breath.   Cardiovascular: Negative for chest pain and palpitations.  Gastrointestinal: Positive for bloating and diarrhea. Negative for abdominal pain, constipation, nausea and vomiting.       Intermittent abdominal cramping with diarrhea.   Endocrine: Negative for cold intolerance, heat intolerance, polydipsia, polyphagia and polyuria.       Blood sugars doing well    Genitourinary: Negative.  Negative for dysuria and frequency.  Musculoskeletal: Negative for arthralgias, back pain, joint swelling, myalgias and neck pain.  Skin: Negative for rash.  Allergic/Immunologic: Negative for environmental allergies.  Neurological: Negative for dizziness, tremors, numbness and headaches.  Hematological: Negative for adenopathy. Does not bruise/bleed easily.  Psychiatric/Behavioral: Negative for behavioral problems (Depression), sleep disturbance and suicidal ideas. The patient is nervous/anxious.    Today's Vitals   11/25/17 1014  BP: (!) 134/59  Pulse: (!) 53  Resp: 16  SpO2: 99%  Weight: 221 lb 9.6 oz (100.5 kg)  Height: 5\' 4"  (1.626 m)      Physical Exam  Constitutional: She is oriented to person, place, and time. She appears well-developed and well-nourished. No distress.  HENT:  Head: Normocephalic and atraumatic.  Nose: Nose normal.  Mouth/Throat: Oropharynx is clear and moist. No oropharyngeal exudate.  Eyes: Pupils are equal, round, and reactive to light. Conjunctivae and EOM are normal.  Neck: Normal range of motion. Neck supple. No JVD present. Carotid bruit is not present. No tracheal deviation present. No thyromegaly present.  Cardiovascular: Normal rate, regular rhythm and normal heart sounds. Exam reveals no gallop and no friction rub.  No murmur heard. Pulmonary/Chest: Effort normal and breath sounds normal. No respiratory distress. She has no wheezes. She has no rales. She exhibits no tenderness.  Abdominal: Soft. Bowel sounds are increased. There is generalized tenderness.  Musculoskeletal: Normal range of motion.  Lymphadenopathy:    She has no cervical adenopathy.  Neurological: She is alert and oriented to person, place, and time. No cranial nerve deficit.  Skin: Skin is warm and dry. Capillary refill takes less than 2 seconds. She is not diaphoretic.  Psychiatric: She has a normal mood and affect. Her behavior is normal. Judgment  and thought content normal.  Nursing note and vitals reviewed.  Assessment/Plan: 1. Uncontrolled type 2 diabetes mellitus with peripheral neuropathy (HCC) - POCT HgB A1C 7.1 today. Continue levemir daily and oral medications as prescribed. Lyrical should be continued as prescribed.   2. Irritable bowel syndrome with diarrhea Will start dicyclomine 10mg  up to three times daily if needed for abdominal cramping and diarrrhea.  - dicyclomine (BENTYL) 10 MG capsule; Take 1 capsule (10 mg total) by mouth 3 (three) times daily as needed for spasms.  Dispense:  90 capsule; Refill: 2  3. Essential hypertension Generally stable. Continue bp medication as prescribed.   4. Mixed hyperlipidemia Continue crestor as prescribed.   General Counseling: [name] understanding of the findings of todays visit and agrees with plan of treatment. I have discussed any further diagnostic evaluation that may be needed or ordered today. We also reviewed her medications today. she has been encouraged to call the office with any questions or concerns that should arise related to todays visit.  Diabetes Counseling:  1. Addition of ACE inh/ ARB'S for nephroprotection. Microalbumin is updated  2. Diabetic foot care, prevention of complications. Podiatry consult 3. Exercise and lose weight.  4. Diabetic eye examination, Diabetic eye exam is updated  5. Monitor blood sugar closlely. nutrition counseling.  6. Sign and symptoms of hypoglycemia including shaking sweating,confusion and headaches.   This patient was seen by Leretha Pol FNP Collaboration with Dr Lavera Guise as a part of collaborative care agreement   Orders Placed This Encounter  Procedures  . POCT HgB A1C    Meds ordered this encounter  Medications  . dicyclomine (BENTYL) 10 MG capsule    Sig: Take 1 capsule (10 mg total) by mouth 3 (three) times daily as needed for spasms.    Dispense:  90 capsule    Refill:  2    Order Specific  Question:   Supervising Provider    Answer:   Lavera Guise [[id number]]    Time spent: 47 Minutes      Dr Lavera Guise Internal medicine

## 2017-12-07 DIAGNOSIS — E782 Mixed hyperlipidemia: Secondary | ICD-10-CM | POA: Insufficient documentation

## 2017-12-07 DIAGNOSIS — I10 Essential (primary) hypertension: Secondary | ICD-10-CM | POA: Insufficient documentation

## 2017-12-07 DIAGNOSIS — K580 Irritable bowel syndrome with diarrhea: Secondary | ICD-10-CM | POA: Insufficient documentation

## 2018-01-09 ENCOUNTER — Other Ambulatory Visit: Payer: Self-pay | Admitting: Internal Medicine

## 2018-02-03 ENCOUNTER — Other Ambulatory Visit: Payer: Self-pay | Admitting: Nurse Practitioner

## 2018-02-03 DIAGNOSIS — E1142 Type 2 diabetes mellitus with diabetic polyneuropathy: Secondary | ICD-10-CM

## 2018-02-03 MED ORDER — PREGABALIN 100 MG PO CAPS: 100.0000 mg | ORAL_CAPSULE | Freq: Three times a day (TID) | ORAL | 2 refills | Status: DC

## 2018-02-03 NOTE — Progress Notes (Signed)
Approved lyrical 100mg  TID per pharmacy request.

## 2018-02-11 ENCOUNTER — Other Ambulatory Visit: Payer: Self-pay

## 2018-02-11 DIAGNOSIS — K580 Irritable bowel syndrome with diarrhea: Secondary | ICD-10-CM

## 2018-02-11 MED ORDER — DICYCLOMINE HCL 10 MG PO CAPS: 10.0000 mg | ORAL_CAPSULE | Freq: Three times a day (TID) | ORAL | 2 refills | Status: DC | PRN

## 2018-02-11 MED ORDER — VERAPAMIL HCL 40 MG PO TABS: ORAL_TABLET | ORAL | 4 refills | Status: DC

## 2018-02-15 ENCOUNTER — Other Ambulatory Visit: Payer: Self-pay | Admitting: Internal Medicine

## 2018-03-04 DIAGNOSIS — R809 Proteinuria, unspecified: Secondary | ICD-10-CM | POA: Diagnosis not present

## 2018-03-04 DIAGNOSIS — R600 Localized edema: Secondary | ICD-10-CM | POA: Diagnosis not present

## 2018-03-04 DIAGNOSIS — N184 Chronic kidney disease, stage 4 (severe): Secondary | ICD-10-CM | POA: Diagnosis not present

## 2018-03-04 DIAGNOSIS — I129 Hypertensive chronic kidney disease with stage 1 through stage 4 chronic kidney disease, or unspecified chronic kidney disease: Secondary | ICD-10-CM | POA: Diagnosis not present

## 2018-03-04 DIAGNOSIS — E1129 Type 2 diabetes mellitus with other diabetic kidney complication: Secondary | ICD-10-CM | POA: Diagnosis not present

## 2018-03-13 ENCOUNTER — Other Ambulatory Visit: Payer: Self-pay

## 2018-03-13 MED ORDER — INSULIN DETEMIR 100 UNIT/ML FLEXPEN: PEN_INJECTOR | SUBCUTANEOUS | 3 refills | Status: DC

## 2018-03-23 ENCOUNTER — Other Ambulatory Visit: Payer: Self-pay | Admitting: Internal Medicine

## 2018-03-24 ENCOUNTER — Other Ambulatory Visit: Payer: Self-pay | Admitting: Nurse Practitioner

## 2018-03-24 DIAGNOSIS — E1140 Type 2 diabetes mellitus with diabetic neuropathy, unspecified: Secondary | ICD-10-CM

## 2018-03-24 MED ORDER — TRAMADOL-ACETAMINOPHEN 37.5-325 MG PO TABS: 1.0000 | ORAL_TABLET | Freq: Two times a day (BID) | ORAL | 0 refills | Status: DC | PRN

## 2018-03-24 NOTE — Progress Notes (Signed)
Approved tramadol/APAP 37.5/325mg  tablets which can be taken up to twice daily. Prescription for #20 with no refills sent to her pharmacy. Will fill remainder of prescription at her visit 03/30/2018

## 2018-03-30 ENCOUNTER — Encounter: Payer: Self-pay | Admitting: Nurse Practitioner

## 2018-03-30 ENCOUNTER — Ambulatory Visit (INDEPENDENT_AMBULATORY_CARE_PROVIDER_SITE_OTHER): Payer: Medicare HMO | Admitting: Nurse Practitioner

## 2018-03-30 VITALS — BP 132/70 | HR 89 | Resp 16 | Ht 64.0 in | Wt 219.6 lb

## 2018-03-30 DIAGNOSIS — E782 Mixed hyperlipidemia: Secondary | ICD-10-CM

## 2018-03-30 DIAGNOSIS — M064 Inflammatory polyarthropathy: Secondary | ICD-10-CM | POA: Diagnosis not present

## 2018-03-30 DIAGNOSIS — E1142 Type 2 diabetes mellitus with diabetic polyneuropathy: Secondary | ICD-10-CM

## 2018-03-30 DIAGNOSIS — I10 Essential (primary) hypertension: Secondary | ICD-10-CM | POA: Diagnosis not present

## 2018-03-30 DIAGNOSIS — G250 Essential tremor: Secondary | ICD-10-CM | POA: Diagnosis not present

## 2018-03-30 DIAGNOSIS — E1165 Type 2 diabetes mellitus with hyperglycemia: Secondary | ICD-10-CM | POA: Diagnosis not present

## 2018-03-30 DIAGNOSIS — IMO0002 Reserved for concepts with insufficient information to code with codable children: Secondary | ICD-10-CM

## 2018-03-30 LAB — POCT GLYCOSYLATED HEMOGLOBIN (HGB A1C): Hemoglobin A1C: 7.8 % — AB (ref 4.0–5.6)

## 2018-03-30 MED ORDER — TRAMADOL-ACETAMINOPHEN 37.5-325 MG PO TABS: 1.0000 | ORAL_TABLET | Freq: Two times a day (BID) | ORAL | 2 refills | Status: DC | PRN

## 2018-03-30 NOTE — Progress Notes (Signed)
[facility] [place], [place] [address]  Internal MEDICINE  Office Visit Note  Patient Name: Kara [name]  [id number]  [id number]  Date of Service: 03/30/2018  Chief Complaint  Patient presents with  . Diabetes  . Hypertension  . Hyperlipidemia    The patient is here for routine follow up visit. Diabetic. On basal insulin at 75 units every day. and sliding scale insulin. Also takes trulicity weekly. Continues to take amaryl twice daily. Sugars are slightly more elevated in the past few months.  She continues to notice a mild tremor in her hands. Slightly more frequent than before. Mostly when hand is active. Will shake while she is trying to eat or using the computer mouse. Happening every time she participates in these activities. It is more severe on the right side than the left. States that her kidney doctor recently increased her lisinopril to twice daily. Blood pressure well controlled, but she has reported some dizziness upon standing from seated position .      Current Medication: Outpatient Encounter Medications as of 03/30/2018  Medication Sig Note  . aspirin EC 81 MG tablet Take 81 mg by mouth daily. Reported on 04/07/2015   . cetirizine (ZYRTEC) 10 MG tablet Take 10 mg by mouth as needed for allergies. Reported on 05/26/2015   . cholecalciferol (VITAMIN D) 1000 UNITS tablet Take 1,000 Units by mouth daily. At 8 pm   . dicyclomine (BENTYL) 10 MG capsule Take 1 capsule (10 mg total) by mouth 3 (three) times daily as needed for spasms.   . furosemide (LASIX) 20 MG tablet Take 20 mg by mouth 2 (two) times daily as needed.   Marland Kitchen glimepiride (AMARYL) 4 MG tablet TAKE 1 TABLET BY MOUTH TWICE A DAY WITH FOOD   . insulin detemir (LEVEMIR) 100 UNIT/ML injection Inject 75 Units into the skin daily after supper.   Marland Kitchen lisinopril (PRINIVIL,ZESTRIL) 20 MG tablet Take 20 mg by mouth daily.   Marland Kitchen loperamide (IMODIUM A-D) 2 MG tablet Take 2 mg by mouth as needed for  diarrhea or loose stools.   . Multiple Vitamin (MULTIVITAMIN) tablet Take 1 tablet by mouth daily at 12 noon.   . nystatin cream (MYCOSTATIN) Apply 1 application topically 2 (two) times daily.   . pantoprazole (PROTONIX) 40 MG tablet TAKE 1 TABLET(S) BY MOUTH DAILY   . pregabalin (LYRICA) 100 MG capsule Take 1 capsule (100 mg total) by mouth 3 (three) times daily.   . rosuvastatin (CRESTOR) 20 MG tablet Take 20 mg by mouth at bedtime.    . traMADol-acetaminophen (ULTRACET) 37.5-325 MG tablet Take 1 tablet by mouth 2 (two) times daily as needed for moderate pain.   . TRULICITY 1.5 FA/2.1HY SOPN Inject 1.5mg  [place] once weekly   . verapamil (CALAN) 40 MG tablet TAKE 1 TABLET(S) BY MOUTH TWICE A DAY FOR HYPERTENSION   . [DISCONTINUED] Insulin Detemir (LEVEMIR FLEXTOUCH) 100 UNIT/ML Pen INJECT SUB-Q 75 UNITS AT BEDTIME ....WITH NEEDLES FOR 250.03   . [DISCONTINUED] traMADol-acetaminophen (ULTRACET) 37.5-325 MG tablet Take 1 tablet by mouth 2 (two) times daily as needed for moderate pain.   Marland Kitchen NEOMYCIN-POLYMYXIN-HYDROCORTISONE (CORTISPORIN) 1 % SOLN otic solution Reported on 05/26/2015 05/26/2015: Received from: External Pharmacy   No facility-administered encounter medications on file as of 03/30/2018.     Surgical History: Past Surgical History:  Procedure Laterality Date  . BREAST BIOPSY Right 2014   neg- core  . BREAST BIOPSY Right 2014   neg  . BREAST  DUCTAL SYSTEM EXCISION    . CATARACT EXTRACTION    . CHOLECYSTECTOMY    . CYSTOSCOPY WITH STENT PLACEMENT Left 04/14/2015   Procedure: CYSTOSCOPY WITH STENT PLACEMENT;  Surgeon: Hollice Espy, MD;  Location: [facility] ORS;  Service: Urology;  Laterality: Left;  . EXTRACORPOREAL SHOCK WAVE LITHOTRIPSY Left 09/29/2014   Procedure: EXTRACORPOREAL SHOCK WAVE LITHOTRIPSY (ESWL);  Surgeon: Hollice Espy, MD;  Location: [facility] ORS;  Service: Urology;  Laterality: Left;  . LAPAROSCOPIC OOPHERECTOMY Left   . TONSILLECTOMY    . URETEROSCOPY WITH HOLMIUM LASER  LITHOTRIPSY Left 04/14/2015   Procedure: URETEROSCOPY WITH HOLMIUM LASER LITHOTRIPSY;  Surgeon: Hollice Espy, MD;  Location: [facility] OR[facility];  Service: Urology;  Laterality: Left;    Medical History: Past Medical History:  Diagnosis Date  . Arthropathy   . Benign neoplasm of breast   . Chest pain   . Diabetes ([place])   . Diabetic retinopathy ([place])   . Edema   . Hyperlipemia   . Hypersomnia with sleep apnea   . Hypertension   . 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 Kidney stones, calcium oxalate   . Lumbago   . Nephrolithiasis   . Osteoarthrosis   . Ovarian failure   . SOB (shortness of breath)     Family History: Family History  Problem Relation Age of Onset  . Breast cancer Maternal Grandmother 60  . Colon cancer Mother     Social History   Socioeconomic History  . Marital status: Married    Spouse name: Not on file  . Number of children: Not on file  . Years of education: Not on file  . Highest education level: Not on file  Occupational History  . Not on file  Social Needs  . Financial resource strain: Not on file  . Food insecurity:    Worry: Not on file    Inability: Not on file  . Transportation needs:    Medical: Not on file    Non-medical: Not on file  Tobacco Use  . Smoking status: Former Smoker    Last attempt to quit: 04/11/1995    Years since quitting: 22.9  . Smokeless tobacco: Never Used  Substance and Sexual Activity  . Alcohol use: No    Alcohol/week: 0.0 standard drinks  . Drug use: No  . Sexual activity: Not on file  Lifestyle  . Physical activity:    Days per week: Not on file    Minutes per session: Not on file  . Stress: Not on file  Relationships  . Social connections:    Talks on phone: Not on file    Gets together: Not on file    Attends religious service: Not on file    Active member of club or organization: Not on file    Attends meetings of clubs or organizations: Not on file    Relationship status: Not on file  .  Intimate partner violence:    Fear of current or ex partner: Not on file    Emotionally abused: Not on file    Physically abused: Not on file    Forced sexual activity: Not on file  Other Topics Concern  . Not on file  Social History Narrative  . Not on file      Review of Systems  Constitutional: Negative for activity change, chills, fatigue and unexpected weight change.  HENT: Negative for congestion, postnasal drip, rhinorrhea, sneezing and sore throat.   Respiratory: Negative for cough, chest tightness, shortness of  breath and wheezing.   Cardiovascular: Negative for chest pain and palpitations.  Gastrointestinal: Negative for abdominal pain, constipation, diarrhea, nausea and vomiting.       .   Endocrine: Negative for cold intolerance, heat intolerance, polydipsia, polyphagia and polyuria.       Blood sugars slightly elevated recently   Musculoskeletal: Positive for arthralgias, back pain and myalgias. Negative for joint swelling and neck pain.  Skin: Negative for rash.  Allergic/Immunologic: Negative for environmental allergies.  Neurological: Positive for dizziness and tremors. Negative for numbness and headaches.  Hematological: Negative for adenopathy. Does not bruise/bleed easily.  Psychiatric/Behavioral: Negative for behavioral problems (Depression), sleep disturbance and suicidal ideas. The patient is nervous/anxious.    Today's Vitals   03/30/18 0923  BP: 132/70  Pulse: 89  Resp: 16  SpO2: 96%  Weight: 219 lb 9.6 oz (99.6 kg)  Height: 5\' 4"  (1.626 m)    Physical Exam  Constitutional: She is oriented to person, place, and time. She appears well-developed and well-nourished. No distress.  HENT:  Head: Normocephalic and atraumatic.  Nose: Nose normal.  Mouth/Throat: No oropharyngeal exudate.  Eyes: Pupils are equal, round, and reactive to light. Conjunctivae and EOM are normal.  Neck: Normal range of motion. Neck supple. No JVD present. Carotid bruit is not  present. No tracheal deviation present. No thyromegaly present.  Cardiovascular: Normal rate, regular rhythm and normal heart sounds. Exam reveals no gallop and no friction rub.  No murmur heard. Pulmonary/Chest: Effort normal and breath sounds normal. No respiratory distress. She has no wheezes. She has no rales. She exhibits no tenderness.  Abdominal: Soft. Bowel sounds are normal. There is no tenderness.  Musculoskeletal: Normal range of motion.  Lymphadenopathy:    She has no cervical adenopathy.  Neurological: She is alert and oriented to person, place, and time. No cranial nerve deficit.  Mild tremor of both outstretched hands, worse on right side then left. Resolves with rest.   Skin: Skin is warm and dry. Capillary refill takes less than 2 seconds. She is not diaphoretic.  Psychiatric: She has a normal mood and affect. Her behavior is normal. Judgment and thought content normal.  Nursing note and vitals reviewed.  Assessment/Plan: 1. Uncontrolled type 2 diabetes mellitus with peripheral neuropathy (HCC) - POCT HgB A1C 7.8 today. Reviewed importance of taking all medications as prescribed and limiting carbohydrates and sugar in the diet. Continue diabetic medications and lyrica as prescribe.d   2. Inflammatory polyarthropathy ([place]) May continue to take tramadol/APAP 37.5/325mg  twice daily as needed for pain. New prescription sent to her pharmacy.  - traMADol-acetaminophen (ULTRACET) 37.5-325 MG tablet; Take 1 tablet by mouth 2 (two) times daily as needed for moderate pain.  Dispense: 60 tablet; Refill: 2  3. Essential hypertension Stable. Continue bp medication as prescribed.   4. Mixed hyperlipidemia Continue crestor as prescribed   5. Benign essential tremor Discussed starting medication such as mirapex as needed. Patient wishes to hold off on this for now. Will monitor closely.   General Counseling: [name] understanding of the findings of todays visit and agrees with  plan of treatment. I have discussed any further diagnostic evaluation that may be needed or ordered today. We also reviewed her medications today. she has been encouraged to call the office with any questions or concerns that should arise related to todays visit.  Diabetes Counseling:  1. Addition of ACE inh/ ARB'S for nephroprotection. Microalbumin is updated  2. Diabetic foot care, prevention of complications. Podiatry consult 3.  Exercise and lose weight.  4. Diabetic eye examination, Diabetic eye exam is updated  5. Monitor blood sugar closlely. nutrition counseling.  6. Sign and symptoms of hypoglycemia including shaking sweating,confusion and headaches.  This patient was seen by Leretha Pol FNP Collaboration with Dr Lavera Guise as a part of collaborative care agreement  Orders Placed This Encounter  Procedures  . POCT HgB A1C    Meds ordered this encounter  Medications  . traMADol-acetaminophen (ULTRACET) 37.5-325 MG tablet    Sig: Take 1 tablet by mouth 2 (two) times daily as needed for moderate pain.    Dispense:  60 tablet    Refill:  2    Order Specific Question:   Supervising Provider    Answer:   Lavera Guise [[id number]]    Time spent: 58 Minutes      Dr Lavera Guise Internal medicine

## 2018-04-17 ENCOUNTER — Other Ambulatory Visit: Payer: Self-pay | Admitting: Internal Medicine

## 2018-05-04 DIAGNOSIS — H35352 Cystoid macular degeneration, left eye: Secondary | ICD-10-CM | POA: Diagnosis not present

## 2018-05-04 DIAGNOSIS — E113293 Type 2 diabetes mellitus with mild nonproliferative diabetic retinopathy without macular edema, bilateral: Secondary | ICD-10-CM | POA: Diagnosis not present

## 2018-06-01 ENCOUNTER — Other Ambulatory Visit: Payer: Self-pay

## 2018-06-01 DIAGNOSIS — E113293 Type 2 diabetes mellitus with mild nonproliferative diabetic retinopathy without macular edema, bilateral: Secondary | ICD-10-CM | POA: Diagnosis not present

## 2018-06-01 DIAGNOSIS — H34832 Tributary (branch) retinal vein occlusion, left eye, with macular edema: Secondary | ICD-10-CM | POA: Diagnosis not present

## 2018-06-01 MED ORDER — INSULIN DETEMIR 100 UNIT/ML ~~LOC~~ SOLN: 75.0000 [IU] | Freq: Every day | SUBCUTANEOUS | 3 refills | Status: DC

## 2018-06-02 ENCOUNTER — Other Ambulatory Visit: Payer: Self-pay

## 2018-06-02 DIAGNOSIS — E1142 Type 2 diabetes mellitus with diabetic polyneuropathy: Secondary | ICD-10-CM

## 2018-06-02 DIAGNOSIS — E1165 Type 2 diabetes mellitus with hyperglycemia: Principal | ICD-10-CM

## 2018-06-02 DIAGNOSIS — IMO0002 Reserved for concepts with insufficient information to code with codable children: Secondary | ICD-10-CM

## 2018-06-02 MED ORDER — INSULIN DETEMIR 100 UNIT/ML ~~LOC~~ SOLN: SUBCUTANEOUS | 3 refills | Status: DC

## 2018-06-29 ENCOUNTER — Ambulatory Visit (INDEPENDENT_AMBULATORY_CARE_PROVIDER_SITE_OTHER): Payer: Medicare HMO | Admitting: Nurse Practitioner

## 2018-06-29 ENCOUNTER — Encounter: Payer: Self-pay | Admitting: Nurse Practitioner

## 2018-06-29 VITALS — BP 138/80 | HR 53 | Resp 16 | Ht 64.0 in | Wt 220.0 lb

## 2018-06-29 DIAGNOSIS — E782 Mixed hyperlipidemia: Secondary | ICD-10-CM

## 2018-06-29 DIAGNOSIS — I10 Essential (primary) hypertension: Secondary | ICD-10-CM

## 2018-06-29 DIAGNOSIS — E1140 Type 2 diabetes mellitus with diabetic neuropathy, unspecified: Secondary | ICD-10-CM | POA: Diagnosis not present

## 2018-06-29 DIAGNOSIS — E1142 Type 2 diabetes mellitus with diabetic polyneuropathy: Secondary | ICD-10-CM | POA: Insufficient documentation

## 2018-06-29 LAB — POCT GLYCOSYLATED HEMOGLOBIN (HGB A1C): Hemoglobin A1C: 7.7 % — AB (ref 4.0–5.6)

## 2018-06-29 MED ORDER — PREGABALIN 100 MG PO CAPS: 100.0000 mg | ORAL_CAPSULE | Freq: Three times a day (TID) | ORAL | 3 refills | Status: DC

## 2018-06-29 NOTE — Progress Notes (Signed)
Pt pulse 53 is low

## 2018-06-29 NOTE — Progress Notes (Signed)
[facility] [place], [place] [address]  Internal MEDICINE  Office Visit Note  Patient Name: Kara [name]  [id number]  [id number]  Date of Service: 06/29/2018  Chief Complaint  Patient presents with  . Hyperlipidemia  . Hypertension  . Diabetes    The patient is here for routine follow up visit. Diabetic. On basal insulin at 75 units every day. and sliding scale insulin. Also takes trulicity weekly. Continues to take amaryl twice daily. Sugars are slightly more elevated in the past few months.  She is now under treatment for condition in her left eye. States that she has having some blood vessels leaking into her eye. She is getting injections into the eye once monthly. Is getting ready to have second injection next week.  States that her kidney doctor recently increased her lisinopril to twice daily. Blood pressure well controlled, but she has reported some dizziness upon standing from seated position .      Current Medication: Outpatient Encounter Medications as of 06/29/2018  Medication Sig Note  . aspirin EC 81 MG tablet Take 81 mg by mouth daily. Reported on 04/07/2015   . cetirizine (ZYRTEC) 10 MG tablet Take 10 mg by mouth as needed for allergies. Reported on 05/26/2015   . cholecalciferol (VITAMIN D) 1000 UNITS tablet Take 1,000 Units by mouth daily. At 8 pm   . dicyclomine (BENTYL) 10 MG capsule Take 1 capsule (10 mg total) by mouth 3 (three) times daily as needed for spasms.   . furosemide (LASIX) 20 MG tablet Take 20 mg by mouth 2 (two) times daily as needed.   Marland Kitchen glimepiride (AMARYL) 4 MG tablet TAKE 1 TABLET BY MOUTH TWICE A DAY WITH FOOD   . insulin detemir (LEVEMIR) 100 UNIT/ML injection Inject 75 units into the skin daily after supper   . lisinopril (PRINIVIL,ZESTRIL) 20 MG tablet Take 20 mg by mouth daily.   Marland Kitchen loperamide (IMODIUM A-D) 2 MG tablet Take 2 mg by mouth as needed for diarrhea or loose stools.   . Multiple Vitamin (MULTIVITAMIN)  tablet Take 1 tablet by mouth daily at 12 noon.   . NEOMYCIN-POLYMYXIN-HYDROCORTISONE (CORTISPORIN) 1 % SOLN otic solution Reported on 05/26/2015 05/26/2015: Received from: External Pharmacy  . nystatin cream (MYCOSTATIN) Apply 1 application topically 2 (two) times daily.   . pantoprazole (PROTONIX) 40 MG tablet TAKE 1 TABLET(S) BY MOUTH DAILY   . pregabalin (LYRICA) 100 MG capsule Take 1 capsule (100 mg total) by mouth 3 (three) times daily.   . rosuvastatin (CRESTOR) 20 MG tablet TAKE 1 TABLET BY MOUTH EVERYDAY AT BEDTIME   . traMADol-acetaminophen (ULTRACET) 37.5-325 MG tablet Take 1 tablet by mouth 2 (two) times daily as needed for moderate pain.   . TRULICITY 1.5 WE/3.1VQ SOPN Inject 1.5mg  [place] once weekly   . verapamil (CALAN) 40 MG tablet TAKE 1 TABLET(S) BY MOUTH TWICE A DAY FOR HYPERTENSION   . [DISCONTINUED] pregabalin (LYRICA) 100 MG capsule Take 1 capsule (100 mg total) by mouth 3 (three) times daily.    No facility-administered encounter medications on file as of 06/29/2018.     Surgical History: Past Surgical History:  Procedure Laterality Date  . BREAST BIOPSY Right 2014   neg- core  . BREAST BIOPSY Right 2014   neg  . BREAST DUCTAL SYSTEM EXCISION    . CATARACT EXTRACTION    . CHOLECYSTECTOMY    . CYSTOSCOPY WITH STENT PLACEMENT Left 04/14/2015   Procedure: CYSTOSCOPY WITH STENT PLACEMENT;  Surgeon:  Hollice Espy, MD;  Location: [facility];  Service: Urology;  Laterality: Left;  . EXTRACORPOREAL SHOCK WAVE LITHOTRIPSY Left 09/29/2014   Procedure: EXTRACORPOREAL SHOCK WAVE LITHOTRIPSY (ESWL);  Surgeon: Hollice Espy, MD;  Location: [facility];  Service: Urology;  Laterality: Left;  . LAPAROSCOPIC OOPHERECTOMY Left   . TONSILLECTOMY    . URETEROSCOPY WITH HOLMIUM LASER LITHOTRIPSY Left 04/14/2015   Procedure: URETEROSCOPY WITH HOLMIUM LASER LITHOTRIPSY;  Surgeon: Hollice Espy, MD;  Location: [facility];  Service: Urology;  Laterality: Left;    Medical History: Past Medical  History:  Diagnosis Date  . Arthropathy   . Benign neoplasm of breast   . Chest pain   . Diabetes ([place])   . Diabetic retinopathy ([place])   . Edema   . Hyperlipemia   . Hypersomnia with sleep apnea   . Hypertension   . 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 Kidney stones, calcium oxalate   . Lumbago   . Nephrolithiasis   . Osteoarthrosis   . Ovarian failure   . SOB (shortness of breath)     Family History: Family History  Problem Relation Age of Onset  . Breast cancer Maternal Grandmother 46  . Colon cancer Mother     Social History   Socioeconomic History  . Marital status: Married    Spouse name: Not on file  . Number of children: Not on file  . Years of education: Not on file  . Highest education level: Not on file  Occupational History  . Not on file  Social Needs  . Financial resource strain: Not on file  . Food insecurity:    Worry: Not on file    Inability: Not on file  . Transportation needs:    Medical: Not on file    Non-medical: Not on file  Tobacco Use  . Smoking status: Former Smoker    Last attempt to quit: 04/11/1995    Years since quitting: 23.2  . Smokeless tobacco: Never Used  Substance and Sexual Activity  . Alcohol use: No    Alcohol/week: 0.0 standard drinks  . Drug use: No  . Sexual activity: Not on file  Lifestyle  . Physical activity:    Days per week: Not on file    Minutes per session: Not on file  . Stress: Not on file  Relationships  . Social connections:    Talks on phone: Not on file    Gets together: Not on file    Attends religious service: Not on file    Active member of club or organization: Not on file    Attends meetings of clubs or organizations: Not on file    Relationship status: Not on file  . Intimate partner violence:    Fear of current or ex partner: Not on file    Emotionally abused: Not on file    Physically abused: Not on file    Forced sexual activity: Not on file  Other Topics Concern  . Not on  file  Social History Narrative  . Not on file      Review of Systems  Constitutional: Negative for activity change, chills, fatigue and unexpected weight change.  HENT: Negative for congestion, postnasal drip, rhinorrhea, sneezing and sore throat.   Eyes: Positive for visual disturbance.  Respiratory: Negative for cough, chest tightness, shortness of breath and wheezing.   Cardiovascular: Negative for chest pain and palpitations.  Gastrointestinal: Negative for abdominal pain, constipation, diarrhea, nausea and vomiting.       Marland Kitchen  Endocrine: Negative for cold intolerance, heat intolerance, polydipsia, polyphagia and polyuria.       Improving blood sugars .  Musculoskeletal: Positive for arthralgias, back pain and myalgias. Negative for joint swelling and neck pain.  Skin: Negative for rash.  Allergic/Immunologic: Negative for environmental allergies.  Neurological: Positive for dizziness and tremors. Negative for numbness and headaches.  Hematological: Negative for adenopathy. Does not bruise/bleed easily.  Psychiatric/Behavioral: Negative for behavioral problems (Depression), sleep disturbance and suicidal ideas. The patient is nervous/anxious.     Today's Vitals   06/29/18 0851  BP: 138/80  Pulse: (!) 53  Resp: 16  SpO2: 96%  Weight: 220 lb (99.8 kg)  Height: 5\' 4"  (1.626 m)   Body mass index is 37.76 kg/m.  Physical Exam Vitals signs and nursing note reviewed.  Constitutional:      General: She is not in acute distress.    Appearance: Normal appearance. She is well-developed. She is not diaphoretic.  HENT:     Head: Normocephalic and atraumatic.     Nose: Nose normal.     Mouth/Throat:     Pharynx: No oropharyngeal exudate.  Eyes:     Conjunctiva/sclera: Conjunctivae normal.     Pupils: Pupils are equal, round, and reactive to light.  Neck:     Musculoskeletal: Normal range of motion and neck supple.     Thyroid: No thyromegaly.     Vascular: No carotid bruit  or JVD.     Trachea: No tracheal deviation.  Cardiovascular:     Rate and Rhythm: Normal rate and regular rhythm.     Heart sounds: Normal heart sounds. No murmur. No friction rub. No gallop.   Pulmonary:     Effort: Pulmonary effort is normal. No respiratory distress.     Breath sounds: Normal breath sounds. No wheezing or rales.  Chest:     Chest wall: No tenderness.  Abdominal:     General: Bowel sounds are normal.     Palpations: Abdomen is soft.     Tenderness: There is no abdominal tenderness.  Musculoskeletal: Normal range of motion.  Lymphadenopathy:     Cervical: No cervical adenopathy.  Skin:    General: Skin is warm and dry.     Capillary Refill: Capillary refill takes less than 2 seconds.  Neurological:     Mental Status: She is alert and oriented to person, place, and time.     Cranial Nerves: No cranial nerve deficit.     Comments: Mild tremor of both outstretched hands, worse on right side then left. Resolves with rest.   Psychiatric:        Behavior: Behavior normal.        Thought Content: Thought content normal.        Judgment: Judgment normal.    Assessment/Plan: 1. Type 2 diabetes mellitus with diabetic neuropathy, unspecified whether long term insulin use (HCC) - POCT HgB A1C 7.7 today. Continue all diabetic medication as prescribed   2. Type 2 diabetes mellitus with peripheral neuropathy (HCC) Continue lyrica 100mg  tid for polyneuropathy. Refill sent to her pharmacy today.  - pregabalin (LYRICA) 100 MG capsule; Take 1 capsule (100 mg total) by mouth 3 (three) times daily.  Dispense: 90 capsule; Refill: 3  3. Essential hypertension Stable. Continue bp medication as prescribed   4. Mixed hyperlipidemia Cholesterol medication should be continued as prescribed   General Counseling: Kara [name] verbalizes understanding of the findings of todays visit and agrees with plan of treatment. I have discussed any  further diagnostic evaluation that may be needed or  ordered today. We also reviewed her medications today. she has been encouraged to call the office with any questions or concerns that should arise related to todays visit.  Diabetes Counseling:  1. Addition of ACE inh/ ARB'S for nephroprotection. Microalbumin is updated  2. Diabetic foot care, prevention of complications. Podiatry consult 3. Exercise and lose weight.  4. Diabetic eye examination, Diabetic eye exam is updated  5. Monitor blood sugar closlely. nutrition counseling.  6. Sign and symptoms of hypoglycemia including shaking sweating,confusion and headaches.  This patient was seen by Leretha Pol FNP Collaboration with Dr Lavera Guise as a part of collaborative care agreement  Orders Placed This Encounter  Procedures  . POCT HgB A1C    Meds ordered this encounter  Medications  . pregabalin (LYRICA) 100 MG capsule    Sig: Take 1 capsule (100 mg total) by mouth 3 (three) times daily.    Dispense:  90 capsule    Refill:  3    Order Specific Question:   Supervising Provider    Answer:   Lavera Guise [[id number]]    Time spent: 32 Minutes      Dr Lavera Guise Internal medicine

## 2018-07-07 DIAGNOSIS — H34832 Tributary (branch) retinal vein occlusion, left eye, with macular edema: Secondary | ICD-10-CM | POA: Diagnosis not present

## 2018-07-09 DIAGNOSIS — N184 Chronic kidney disease, stage 4 (severe): Secondary | ICD-10-CM | POA: Diagnosis not present

## 2018-07-09 DIAGNOSIS — R600 Localized edema: Secondary | ICD-10-CM | POA: Diagnosis not present

## 2018-07-09 DIAGNOSIS — E1129 Type 2 diabetes mellitus with other diabetic kidney complication: Secondary | ICD-10-CM | POA: Diagnosis not present

## 2018-07-09 DIAGNOSIS — I129 Hypertensive chronic kidney disease with stage 1 through stage 4 chronic kidney disease, or unspecified chronic kidney disease: Secondary | ICD-10-CM | POA: Diagnosis not present

## 2018-07-09 DIAGNOSIS — N2581 Secondary hyperparathyroidism of renal origin: Secondary | ICD-10-CM | POA: Diagnosis not present

## 2018-07-15 ENCOUNTER — Other Ambulatory Visit: Payer: Self-pay

## 2018-07-15 MED ORDER — PANTOPRAZOLE SODIUM 40 MG PO TBEC: DELAYED_RELEASE_TABLET | ORAL | 1 refills | Status: DC

## 2018-07-16 ENCOUNTER — Other Ambulatory Visit: Payer: Self-pay | Admitting: Nurse Practitioner

## 2018-07-16 MED ORDER — VERAPAMIL HCL 40 MG PO TABS: ORAL_TABLET | ORAL | 4 refills | Status: DC

## 2018-08-04 ENCOUNTER — Ambulatory Visit: Payer: Self-pay | Admitting: Nurse Practitioner

## 2018-08-12 ENCOUNTER — Encounter: Payer: Self-pay | Admitting: Nurse Practitioner

## 2018-08-18 ENCOUNTER — Other Ambulatory Visit: Payer: Self-pay

## 2018-08-18 MED ORDER — TRULICITY 1.5 MG/0.5ML ~~LOC~~ SOAJ: SUBCUTANEOUS | 5 refills | Status: DC

## 2018-08-31 ENCOUNTER — Other Ambulatory Visit: Payer: Self-pay

## 2018-08-31 DIAGNOSIS — E1142 Type 2 diabetes mellitus with diabetic polyneuropathy: Secondary | ICD-10-CM

## 2018-08-31 DIAGNOSIS — IMO0002 Reserved for concepts with insufficient information to code with codable children: Secondary | ICD-10-CM

## 2018-08-31 MED ORDER — INSULIN DETEMIR 100 UNIT/ML ~~LOC~~ SOLN: SUBCUTANEOUS | 3 refills | Status: DC

## 2018-09-08 DIAGNOSIS — H34832 Tributary (branch) retinal vein occlusion, left eye, with macular edema: Secondary | ICD-10-CM | POA: Diagnosis not present

## 2018-09-28 ENCOUNTER — Other Ambulatory Visit: Payer: Self-pay

## 2018-09-28 MED ORDER — GLIMEPIRIDE 4 MG PO TABS: 4.0000 mg | ORAL_TABLET | Freq: Two times a day (BID) | ORAL | 1 refills | Status: DC

## 2018-10-01 ENCOUNTER — Other Ambulatory Visit: Payer: Self-pay | Admitting: Nurse Practitioner

## 2018-10-01 ENCOUNTER — Ambulatory Visit (INDEPENDENT_AMBULATORY_CARE_PROVIDER_SITE_OTHER): Payer: Medicare HMO | Admitting: Nurse Practitioner

## 2018-10-01 ENCOUNTER — Encounter: Payer: Self-pay | Admitting: Nurse Practitioner

## 2018-10-01 ENCOUNTER — Other Ambulatory Visit: Payer: Self-pay

## 2018-10-01 VITALS — BP 156/72 | HR 64 | Resp 16 | Ht 64.0 in | Wt 221.0 lb

## 2018-10-01 DIAGNOSIS — Z0001 Encounter for general adult medical examination with abnormal findings: Secondary | ICD-10-CM | POA: Diagnosis not present

## 2018-10-01 DIAGNOSIS — Z1231 Encounter for screening mammogram for malignant neoplasm of breast: Secondary | ICD-10-CM

## 2018-10-01 DIAGNOSIS — R300 Dysuria: Secondary | ICD-10-CM | POA: Diagnosis not present

## 2018-10-01 DIAGNOSIS — R0609 Other forms of dyspnea: Secondary | ICD-10-CM

## 2018-10-01 DIAGNOSIS — Z1239 Encounter for other screening for malignant neoplasm of breast: Secondary | ICD-10-CM | POA: Diagnosis not present

## 2018-10-01 DIAGNOSIS — M064 Inflammatory polyarthropathy: Secondary | ICD-10-CM

## 2018-10-01 DIAGNOSIS — I10 Essential (primary) hypertension: Secondary | ICD-10-CM

## 2018-10-01 DIAGNOSIS — E1165 Type 2 diabetes mellitus with hyperglycemia: Secondary | ICD-10-CM

## 2018-10-01 LAB — POCT GLYCOSYLATED HEMOGLOBIN (HGB A1C): Hemoglobin A1C: 7.8 % — AB (ref 4.0–5.6)

## 2018-10-01 MED ORDER — TRAMADOL-ACETAMINOPHEN 37.5-325 MG PO TABS: 1.0000 | ORAL_TABLET | Freq: Two times a day (BID) | ORAL | 2 refills | Status: DC | PRN

## 2018-10-01 MED ORDER — GLIMEPIRIDE 4 MG PO TABS: 4.0000 mg | ORAL_TABLET | Freq: Two times a day (BID) | ORAL | 1 refills | Status: DC

## 2018-10-01 NOTE — Progress Notes (Signed)
[facility] [place], [place] [address]  Internal MEDICINE  Office Visit Note  Patient Name: Kara [name]  [id number]  [id number]  Date of Service: 10/03/2018   Pt is here for routine health maintenance examination  Chief Complaint  Patient presents with  . Medicare Wellness    medication refills  . Diabetes  . Hypertension  . Hyperlipidemia  . Quality Metric Gaps    foot exam  . Pain    left hannd pain that have been going on for several months now, finger gets stiff and hand painful not sure if it is possible arthritis     The patient is here for routine follow up visit. Diabetic. On basal insulin at 75 units every day. and sliding scale insulin. Also takes trulicity weekly. Continues to take amaryl twice daily. Sugars are slightly more elevated in the past few months.  She is now under treatment for condition in her left eye. States that she has having some blood vessels leaking into her eye. She is getting injections into the eye once monthly. Has had three injections in her  States that her kidney doctor recently increased her lisinopril to twice daily. Blood pressure well controlled, but she has reported some dizziness upon standing from seated position. She is having pain and tenderness of ring finger of left hand. Gets stuck in bent position and hurts to straighten it out. Hurts to even type with the pain.     Current Medication: Outpatient Encounter Medications as of 10/01/2018  Medication Sig Note  . aspirin EC 81 MG tablet Take 81 mg by mouth daily. Reported on 04/07/2015   . cetirizine (ZYRTEC) 10 MG tablet Take 10 mg by mouth as needed for allergies. Reported on 05/26/2015   . cholecalciferol (VITAMIN D) 1000 UNITS tablet Take 1,000 Units by mouth daily. At 8 pm   . dicyclomine (BENTYL) 10 MG capsule Take 1 capsule (10 mg total) by mouth 3 (three) times daily as needed for spasms.   . furosemide (LASIX) 20 MG tablet Take 20 mg by mouth 2  (two) times daily as needed.   Marland Kitchen glimepiride (AMARYL) 4 MG tablet Take 1 tablet (4 mg total) by mouth 2 (two) times daily with a meal.   . insulin detemir (LEVEMIR) 100 UNIT/ML injection Inject 75 units into the skin daily after supper   . lisinopril (PRINIVIL,ZESTRIL) 20 MG tablet Take 20 mg by mouth daily.   Marland Kitchen loperamide (IMODIUM A-D) 2 MG tablet Take 2 mg by mouth as needed for diarrhea or loose stools.   . Multiple Vitamin (MULTIVITAMIN) tablet Take 1 tablet by mouth daily at 12 noon.   . NEOMYCIN-POLYMYXIN-HYDROCORTISONE (CORTISPORIN) 1 % SOLN otic solution Reported on 05/26/2015 05/26/2015: Received from: External Pharmacy  . nystatin cream (MYCOSTATIN) Apply 1 application topically 2 (two) times daily.   . pantoprazole (PROTONIX) 40 MG tablet TAKE 1 TABLET(S) BY MOUTH DAILY   . pregabalin (LYRICA) 100 MG capsule Take 1 capsule (100 mg total) by mouth 3 (three) times daily.   . rosuvastatin (CRESTOR) 20 MG tablet TAKE 1 TABLET BY MOUTH EVERYDAY AT BEDTIME   . traMADol-acetaminophen (ULTRACET) 37.5-325 MG tablet Take 1 tablet by mouth 2 (two) times daily as needed for moderate pain.   . TRULICITY 1.5 FV/4.9SW SOPN Inject 1.5mg  [place] once weekly   . verapamil (CALAN) 40 MG tablet TAKE 1 TABLET(S) BY MOUTH TWICE A DAY FOR HYPERTENSION   . [DISCONTINUED] glimepiride (AMARYL) 4 MG tablet Take  1 tablet (4 mg total) by mouth 2 (two) times daily with a meal.   . [DISCONTINUED] traMADol-acetaminophen (ULTRACET) 37.5-325 MG tablet Take 1 tablet by mouth 2 (two) times daily as needed for moderate pain.    No facility-administered encounter medications on file as of 10/01/2018.     Surgical History: Past Surgical History:  Procedure Laterality Date  . BREAST BIOPSY Right 2014   neg- core  . BREAST BIOPSY Right 2014   neg  . BREAST DUCTAL SYSTEM EXCISION    . CATARACT EXTRACTION    . CHOLECYSTECTOMY    . CYSTOSCOPY WITH STENT PLACEMENT Left 04/14/2015   Procedure: CYSTOSCOPY WITH STENT PLACEMENT;   Surgeon: Hollice Espy, MD;  Location: [facility] ORS;  Service: Urology;  Laterality: Left;  . EXTRACORPOREAL SHOCK WAVE LITHOTRIPSY Left 09/29/2014   Procedure: EXTRACORPOREAL SHOCK WAVE LITHOTRIPSY (ESWL);  Surgeon: Hollice Espy, MD;  Location: [facility] ORS;  Service: Urology;  Laterality: Left;  . LAPAROSCOPIC OOPHERECTOMY Left   . TONSILLECTOMY    . URETEROSCOPY WITH HOLMIUM LASER LITHOTRIPSY Left 04/14/2015   Procedure: URETEROSCOPY WITH HOLMIUM LASER LITHOTRIPSY;  Surgeon: Hollice Espy, MD;  Location: [facility] OR[facility];  Service: Urology;  Laterality: Left;    Medical History: Past Medical History:  Diagnosis Date  . Arthropathy   . Benign neoplasm of breast   . Chest pain   . Diabetes ([place])   . Diabetic retinopathy ([place])   . Edema   . Hyperlipemia   . Hypersomnia with sleep apnea   . Hypertension   . 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 Kidney stones, calcium oxalate   . Lumbago   . Nephrolithiasis   . Osteoarthrosis   . Ovarian failure   . SOB (shortness of breath)     Family History: Family History  Problem Relation Age of Onset  . Breast cancer Maternal Grandmother 62  . Colon cancer Mother       Review of Systems  Constitutional: Negative for activity change, chills, fatigue and unexpected weight change.  HENT: Negative for congestion, postnasal drip, rhinorrhea, sneezing and sore throat.   Eyes: Positive for visual disturbance.  Respiratory: Negative for cough, chest tightness, shortness of breath and wheezing.   Cardiovascular: Negative for chest pain and palpitations.  Gastrointestinal: Negative for abdominal pain, constipation, diarrhea, nausea and vomiting.       .   Endocrine: Negative for cold intolerance, heat intolerance, polydipsia, polyphagia and polyuria.       Improving blood sugars .  Musculoskeletal: Positive for arthralgias, back pain and myalgias. Negative for joint swelling and neck pain.       Ring finger of right hand  Skin: Negative for rash.   Allergic/Immunologic: Negative for environmental allergies.  Neurological: Positive for dizziness. Negative for tremors, numbness and headaches.  Hematological: Negative for adenopathy. Does not bruise/bleed easily.  Psychiatric/Behavioral: Negative for behavioral problems (Depression), sleep disturbance and suicidal ideas. The patient is nervous/anxious.     Today's Vitals   10/01/18 0953  BP: (!) 156/72  Pulse: 64  Resp: 16  SpO2: 96%  Weight: 221 lb (100.2 kg)  Height: 5\' 4"  (1.626 m)   Body mass index is 37.93 kg/m.   Physical Exam Vitals signs and nursing note reviewed.  Constitutional:      General: She is not in acute distress.    Appearance: Normal appearance. She is well-developed. She is obese. She is not diaphoretic.  HENT:     Head: Normocephalic and atraumatic.  Nose: Nose normal.     Mouth/Throat:     Pharynx: No oropharyngeal exudate.  Eyes:     Conjunctiva/sclera: Conjunctivae normal.     Pupils: Pupils are equal, round, and reactive to light.  Neck:     Musculoskeletal: Normal range of motion and neck supple.     Thyroid: No thyromegaly.     Vascular: No carotid bruit or JVD.     Trachea: No tracheal deviation.  Cardiovascular:     Rate and Rhythm: Normal rate and regular rhythm.     Pulses: Normal pulses.          Dorsalis pedis pulses are 2+ on the right side and 2+ on the left side.       Posterior tibial pulses are 2+ on the right side and 2+ on the left side.     Heart sounds: Normal heart sounds. No murmur. No friction rub. No gallop.   Pulmonary:     Effort: Pulmonary effort is normal. No respiratory distress.     Breath sounds: Normal breath sounds. No wheezing or rales.  Chest:     Chest wall: No tenderness.     Breasts:        Right: Normal. No swelling, bleeding, inverted nipple, mass, nipple discharge, skin change or tenderness.        Left: Normal. No swelling, bleeding, inverted nipple, mass, nipple discharge, skin change or  tenderness.  Abdominal:     General: Bowel sounds are normal.     Palpations: Abdomen is soft.     Tenderness: There is no abdominal tenderness.  Musculoskeletal: Normal range of motion.     Right foot: Normal range of motion. No deformity or bunion.     Left foot: Normal range of motion. No deformity or bunion.  Feet:     Right foot:     Protective Sensation: 10 sites tested. 4 sites sensed.     Skin integrity: Callus present.     Left foot:     Protective Sensation: 10 sites tested. 4 sites sensed.     Skin integrity: Callus present.     Comments: She has decreased sensation  Lymphadenopathy:     Cervical: No cervical adenopathy.  Skin:    General: Skin is warm and dry.     Capillary Refill: Capillary refill takes less than 2 seconds.  Neurological:     Mental Status: She is alert and oriented to person, place, and time.     Cranial Nerves: No cranial nerve deficit.     Comments: Mild tremor of both outstretched hands, worse on right side then left. Resolves with rest.   Psychiatric:        Behavior: Behavior normal.        Thought Content: Thought content normal.        Judgment: Judgment normal.    Depression screen [facility] 2/9 10/01/2018 06/29/2018 03/30/2018 11/25/2017 07/28/2017  Decreased Interest 0 0 0 0 0  Down, Depressed, Hopeless 0 0 0 0 0  PHQ - 2 Score 0 0 0 0 0    Functional Status Survey: Is the patient deaf or have difficulty hearing?: No Does the patient have difficulty seeing, even when wearing glasses/contacts?: Yes(pt currently getting shots in left eye) Does the patient have difficulty concentrating, remembering, or making decisions?: Yes(occasional memory) Does the patient have difficulty walking or climbing stairs?: Yes(due to knee problems) Does the patient have difficulty dressing or bathing?: No Does the patient have difficulty doing errands  alone such as visiting a doctor's office or shopping?: No  MMSE - Mini Mental State Exam 10/01/2018  Orientation to  time 5  Orientation to Place 5  Registration 3  Attention/ Calculation 5  Recall 3  Language- name 2 objects 2  Language- repeat 1  Language- follow 3 step command 3  Language- read & follow direction 1  Write a sentence 1  Copy design 1  Total score 30    Fall Risk  10/01/2018 06/29/2018 03/30/2018 11/25/2017 07/28/2017  Falls in the past year? 0 0 0 No No  Number falls in past yr: - 0 0 - -  Injury with Fall? - 0 0 - -      LABS: Recent Results (from the past 2160 hour(s))  UA/M w/rflx Culture, Routine     Status: Abnormal (Preliminary result)   Collection Time: 10/01/18  9:45 AM   Specimen: Urine   URINE  Result Value Ref Range   Specific Gravity, UA 1.012 1.005 - 1.030   pH, UA 5.5 5.0 - 7.5   Color, UA Yellow Yellow   Appearance Ur Cloudy (A) Clear   Leukocytes,UA Trace (A) Negative   Protein,UA Negative Negative/Trace   Glucose, UA Negative Negative   Ketones, UA Negative Negative   RBC, UA Negative Negative   Bilirubin, UA Negative Negative   Urobilinogen, Ur 0.2 0.2 - 1.0 mg/dL   Nitrite, UA Negative Negative   Microscopic Examination See below:     Comment: Microscopic was indicated and was performed.   Urinalysis Reflex Comment     Comment: This specimen has reflexed to a Urine Culture.  Microscopic Examination     Status: None   Collection Time: 10/01/18  9:45 AM   URINE  Result Value Ref Range   WBC, UA 0-5 0 - 5 /hpf   RBC None seen 0 - 2 /hpf   Epithelial Cells (non renal) 0-10 0 - 10 /hpf   Casts None seen None seen /lpf   Mucus, UA Present Not Estab.   Bacteria, UA Few None seen/Few  Urine Culture, Reflex     Status: None (Preliminary result)   Collection Time: 10/01/18  9:45 AM   URINE  Result Value Ref Range   Urine Culture, Routine WILL FOLLOW   POCT HgB A1C     Status: Abnormal   Collection Time: 10/01/18 10:17 AM  Result Value Ref Range   Hemoglobin A1C 7.8 (A) 4.0 - 5.6 %   HbA1c POC (<> result, manual entry)     HbA1c, POC (prediabetic  range)     HbA1c, POC (controlled diabetic range)      Assessment/Plan: 1. Encounter for general adult medical examination with abnormal findings Annual health maintenance exam today.  2. Uncontrolled type 2 diabetes mellitus with hyperglycemia (HCC) - POCT HgB A1C 7.8 today. No medication changes made. Discussed diet and lifestyle changes to lower blood sugars without medicatoin adjustments.  - glimepiride (AMARYL) 4 MG tablet; Take 1 tablet (4 mg total) by mouth 2 (two) times daily with a meal.  Dispense: 180 tablet; Refill: 1  3. Essential hypertension Stable. Continue bp medication as prescribed.   4. Inflammatory polyarthropathy ([place]) May continue tramadol/APAP 5/325mg  tablets twice daily when needed for moderate pain. New prescription sent to her pharmacy.  - traMADol-acetaminophen (ULTRACET) 37.5-325 MG tablet; Take 1 tablet by mouth 2 (two) times daily as needed for moderate pain.  Dispense: 60 tablet; Refill: 2  5. Dyspnea on exertion Will get echocardiogram for  further evaluation.  - ECHOCARDIOGRAM COMPLETE; Future  6. Screening for breast cancer Screening mammogram to be scheduled.   7. Dysuria - UA/M w/rflx Culture, Routine  General Counseling: Mersades verbalizes understanding of the findings of todays visit and agrees with plan of treatment. I have discussed any further diagnostic evaluation that may be needed or ordered today. We also reviewed her medications today. she has been encouraged to call the office with any questions or concerns that should arise related to todays visit.    Counseling:  Diabetes Counseling:  1. Addition of ACE inh/ ARB'S for nephroprotection. Microalbumin is updated  2. Diabetic foot care, prevention of complications. Podiatry consult 3. Exercise and lose weight.  4. Diabetic eye examination, Diabetic eye exam is updated  5. Monitor blood sugar closlely. nutrition counseling.  6. Sign and symptoms of hypoglycemia including shaking  sweating,confusion and headaches.   This patient was seen by Leretha Pol FNP Collaboration with Dr Lavera Guise as a part of collaborative care agreement  Orders Placed This Encounter  Procedures  . Microscopic Examination  . Urine Culture, Reflex  . UA/M w/rflx Culture, Routine  . POCT HgB A1C  . ECHOCARDIOGRAM COMPLETE    Meds ordered this encounter  Medications  . glimepiride (AMARYL) 4 MG tablet    Sig: Take 1 tablet (4 mg total) by mouth 2 (two) times daily with a meal.    Dispense:  180 tablet    Refill:  1    Order Specific Question:   Supervising Provider    Answer:   Lavera Guise [place]  . traMADol-acetaminophen (ULTRACET) 37.5-325 MG tablet    Sig: Take 1 tablet by mouth 2 (two) times daily as needed for moderate pain.    Dispense:  60 tablet    Refill:  2    Order Specific Question:   Supervising Provider    Answer:   Lavera Guise [[id number]]    Time spent: [place], MD  Internal Medicine

## 2018-10-01 NOTE — Progress Notes (Signed)
Pt blood pressure elevated, was taken twice, 1st took with machine no reading, 2nd manually 156/72 Informed provider

## 2018-10-03 DIAGNOSIS — Z1239 Encounter for other screening for malignant neoplasm of breast: Secondary | ICD-10-CM | POA: Insufficient documentation

## 2018-10-03 DIAGNOSIS — R300 Dysuria: Secondary | ICD-10-CM | POA: Insufficient documentation

## 2018-10-03 DIAGNOSIS — R0609 Other forms of dyspnea: Secondary | ICD-10-CM | POA: Insufficient documentation

## 2018-10-04 LAB — MICROSCOPIC EXAMINATION
Casts: NONE SEEN /lpf
RBC, Urine: NONE SEEN /hpf (ref 0–2)

## 2018-10-04 LAB — UA/M W/RFLX CULTURE, ROUTINE
Bilirubin, UA: NEGATIVE
Glucose, UA: NEGATIVE
Ketones, UA: NEGATIVE
Nitrite, UA: NEGATIVE
Protein,UA: NEGATIVE
RBC, UA: NEGATIVE
Specific Gravity, UA: 1.012 (ref 1.005–1.030)
Urobilinogen, Ur: 0.2 mg/dL (ref 0.2–1.0)
pH, UA: 5.5 (ref 5.0–7.5)

## 2018-10-04 LAB — URINE CULTURE, REFLEX

## 2018-10-13 DIAGNOSIS — H34832 Tributary (branch) retinal vein occlusion, left eye, with macular edema: Secondary | ICD-10-CM | POA: Diagnosis not present

## 2018-10-16 ENCOUNTER — Ambulatory Visit: Payer: Medicare HMO

## 2018-10-16 ENCOUNTER — Other Ambulatory Visit: Payer: Self-pay

## 2018-10-16 DIAGNOSIS — R0602 Shortness of breath: Secondary | ICD-10-CM | POA: Diagnosis not present

## 2018-10-16 DIAGNOSIS — R0609 Other forms of dyspnea: Secondary | ICD-10-CM

## 2018-10-19 ENCOUNTER — Other Ambulatory Visit: Payer: Self-pay

## 2018-10-19 DIAGNOSIS — E1142 Type 2 diabetes mellitus with diabetic polyneuropathy: Secondary | ICD-10-CM

## 2018-10-19 DIAGNOSIS — IMO0002 Reserved for concepts with insufficient information to code with codable children: Secondary | ICD-10-CM

## 2018-10-19 MED ORDER — INSULIN DETEMIR 100 UNIT/ML ~~LOC~~ SOLN: SUBCUTANEOUS | 3 refills | Status: DC

## 2018-10-20 ENCOUNTER — Other Ambulatory Visit: Payer: Self-pay | Admitting: Nurse Practitioner

## 2018-10-20 DIAGNOSIS — E1142 Type 2 diabetes mellitus with diabetic polyneuropathy: Secondary | ICD-10-CM

## 2018-10-20 DIAGNOSIS — IMO0002 Reserved for concepts with insufficient information to code with codable children: Secondary | ICD-10-CM

## 2018-10-20 MED ORDER — INSULIN DETEMIR 100 UNIT/ML ~~LOC~~ SOLN: SUBCUTANEOUS | 3 refills | Status: DC

## 2018-11-09 DIAGNOSIS — E1129 Type 2 diabetes mellitus with other diabetic kidney complication: Secondary | ICD-10-CM | POA: Diagnosis not present

## 2018-11-09 DIAGNOSIS — N184 Chronic kidney disease, stage 4 (severe): Secondary | ICD-10-CM | POA: Diagnosis not present

## 2018-11-09 DIAGNOSIS — I129 Hypertensive chronic kidney disease with stage 1 through stage 4 chronic kidney disease, or unspecified chronic kidney disease: Secondary | ICD-10-CM | POA: Diagnosis not present

## 2018-11-09 DIAGNOSIS — D631 Anemia in chronic kidney disease: Secondary | ICD-10-CM | POA: Diagnosis not present

## 2018-11-09 DIAGNOSIS — R600 Localized edema: Secondary | ICD-10-CM | POA: Diagnosis not present

## 2018-11-09 DIAGNOSIS — N2581 Secondary hyperparathyroidism of renal origin: Secondary | ICD-10-CM | POA: Diagnosis not present

## 2018-11-17 DIAGNOSIS — H34832 Tributary (branch) retinal vein occlusion, left eye, with macular edema: Secondary | ICD-10-CM | POA: Diagnosis not present

## 2018-11-23 ENCOUNTER — Other Ambulatory Visit: Payer: Self-pay

## 2018-11-23 ENCOUNTER — Ambulatory Visit
Admission: RE | Admit: 2018-11-23 | Discharge: 2018-11-23 | Disposition: A | Discharge status: Home / Self Care | Payer: Medicare HMO | Source: Ambulatory Visit | Attending: Nurse Practitioner | Admitting: Nurse Practitioner

## 2018-11-23 DIAGNOSIS — Z1231 Encounter for screening mammogram for malignant neoplasm of breast: Secondary | ICD-10-CM | POA: Diagnosis not present

## 2018-11-24 ENCOUNTER — Other Ambulatory Visit: Payer: Self-pay | Admitting: Nurse Practitioner

## 2018-11-24 DIAGNOSIS — N6310 Unspecified lump in the right breast, unspecified quadrant: Secondary | ICD-10-CM

## 2018-11-24 DIAGNOSIS — R928 Other abnormal and inconclusive findings on diagnostic imaging of breast: Secondary | ICD-10-CM

## 2018-12-02 NOTE — Progress Notes (Signed)
Needs additional images

## 2018-12-08 ENCOUNTER — Other Ambulatory Visit: Payer: Self-pay | Admitting: Nurse Practitioner

## 2018-12-08 MED ORDER — VERAPAMIL HCL 40 MG PO TABS: ORAL_TABLET | ORAL | 4 refills | Status: DC

## 2018-12-14 ENCOUNTER — Other Ambulatory Visit: Payer: Self-pay | Admitting: Nurse Practitioner

## 2018-12-14 ENCOUNTER — Ambulatory Visit
Admission: RE | Admit: 2018-12-14 | Discharge: 2018-12-14 | Disposition: A | Discharge status: Home / Self Care | Payer: Medicare HMO | Source: Ambulatory Visit | Attending: Nurse Practitioner | Admitting: Nurse Practitioner

## 2018-12-14 DIAGNOSIS — R928 Other abnormal and inconclusive findings on diagnostic imaging of breast: Secondary | ICD-10-CM

## 2018-12-14 DIAGNOSIS — N6310 Unspecified lump in the right breast, unspecified quadrant: Secondary | ICD-10-CM

## 2018-12-14 DIAGNOSIS — N6311 Unspecified lump in the right breast, upper outer quadrant: Secondary | ICD-10-CM | POA: Diagnosis not present

## 2018-12-14 DIAGNOSIS — R921 Mammographic calcification found on diagnostic imaging of breast: Secondary | ICD-10-CM | POA: Diagnosis not present

## 2018-12-16 NOTE — Progress Notes (Signed)
Suspicious findings. Follow up per breast center.

## 2018-12-19 ENCOUNTER — Other Ambulatory Visit: Payer: Self-pay | Admitting: Internal Medicine

## 2018-12-21 ENCOUNTER — Ambulatory Visit
Admission: RE | Admit: 2018-12-21 | Discharge: 2018-12-21 | Disposition: A | Discharge status: Home / Self Care | Payer: Medicare HMO | Source: Ambulatory Visit | Attending: Nurse Practitioner | Admitting: Nurse Practitioner

## 2018-12-21 ENCOUNTER — Other Ambulatory Visit: Payer: Self-pay | Admitting: Nurse Practitioner

## 2018-12-21 ENCOUNTER — Other Ambulatory Visit: Payer: Self-pay

## 2018-12-21 DIAGNOSIS — D0511 Intraductal carcinoma in situ of right breast: Secondary | ICD-10-CM | POA: Diagnosis not present

## 2018-12-21 DIAGNOSIS — R928 Other abnormal and inconclusive findings on diagnostic imaging of breast: Secondary | ICD-10-CM

## 2018-12-21 DIAGNOSIS — D241 Benign neoplasm of right breast: Secondary | ICD-10-CM | POA: Diagnosis not present

## 2018-12-21 DIAGNOSIS — N6311 Unspecified lump in the right breast, upper outer quadrant: Secondary | ICD-10-CM | POA: Diagnosis not present

## 2018-12-21 DIAGNOSIS — E1142 Type 2 diabetes mellitus with diabetic polyneuropathy: Secondary | ICD-10-CM

## 2018-12-21 DIAGNOSIS — D4861 Neoplasm of uncertain behavior of right breast: Secondary | ICD-10-CM | POA: Diagnosis not present

## 2018-12-21 HISTORY — PX: BREAST BIOPSY: SHX20

## 2018-12-21 MED ORDER — PREGABALIN 100 MG PO CAPS: 100.0000 mg | ORAL_CAPSULE | Freq: Three times a day (TID) | ORAL | 3 refills | Status: DC

## 2018-12-21 NOTE — Progress Notes (Signed)
Refilled prescription or lyrica per pharmacy request.

## 2018-12-22 ENCOUNTER — Other Ambulatory Visit: Payer: Self-pay

## 2018-12-22 DIAGNOSIS — E1142 Type 2 diabetes mellitus with diabetic polyneuropathy: Secondary | ICD-10-CM

## 2018-12-22 DIAGNOSIS — H34832 Tributary (branch) retinal vein occlusion, left eye, with macular edema: Secondary | ICD-10-CM | POA: Diagnosis not present

## 2018-12-22 DIAGNOSIS — IMO0002 Reserved for concepts with insufficient information to code with codable children: Secondary | ICD-10-CM

## 2018-12-22 MED ORDER — INSULIN DETEMIR 100 UNIT/ML ~~LOC~~ SOLN: SUBCUTANEOUS | 3 refills | Status: DC

## 2018-12-23 ENCOUNTER — Other Ambulatory Visit: Payer: Self-pay

## 2018-12-23 DIAGNOSIS — D0511 Intraductal carcinoma in situ of right breast: Secondary | ICD-10-CM

## 2018-12-24 ENCOUNTER — Other Ambulatory Visit: Payer: Self-pay | Admitting: Nurse Practitioner

## 2018-12-24 ENCOUNTER — Encounter: Payer: Self-pay | Admitting: Nurse Practitioner

## 2018-12-24 DIAGNOSIS — R928 Other abnormal and inconclusive findings on diagnostic imaging of breast: Secondary | ICD-10-CM

## 2018-12-24 DIAGNOSIS — N6310 Unspecified lump in the right breast, unspecified quadrant: Secondary | ICD-10-CM

## 2018-12-24 NOTE — Progress Notes (Signed)
  Oncology Nurse Navigator Documentation  Navigator Location: [facility] (12/23/18 1600) Referral Date to RadOnc/MedOnc: 12/31/18 (12/23/18 1600) )Navigator Encounter Type: Introductory Phone Call (12/23/18 1600)   Abnormal Finding Date: 12/14/18 (12/24/18 0900) Confirmed Diagnosis Date: 12/23/18 (12/23/18 1600)               Patient Visit Type: Initial (12/23/18 1600) Treatment Phase: Pre-Tx/Tx Discussion (12/23/18 1600) Barriers/Navigation Needs: Coordination of Care;Education (12/23/18 1600) Education: Accessing Care/ Finding Providers;Coping with Diagnosis/ Prognosis (12/23/18 1600) Interventions: Coordination of Care;Psycho-Social Support (12/23/18 1600)   Coordination of Care: Appts (12/23/18 1600)                  Time Spent with Patient: 45 (12/23/18 1600)   Initiated navigation with phone call. Supported patient and husband.  Scheduled Med/Onc and surgical Consults per patient request.  States she had been to [facility] surgery in the past.

## 2018-12-25 NOTE — Progress Notes (Signed)
[place]  [phone]) [phone] [phone]  ID: [name] OB: [birth year]  MR#: [id number]  QIO#:[id number]  Patient Care Team: Lavera Guise, MD as PCP - General (Internal Medicine)  CHIEF COMPLAINT: Low-grade DCIS of the right breast.  INTERVAL HISTORY: Patient is a 73 year old female who underwent her yearly screening mammogram and noted abnormality.  Repeat images, ultrasound, biopsy revealed DCIS without invasive component.  She is highly anxious, but otherwise feels well.  She has no neurologic complaints.  She denies any recent fevers or illnesses.  She has a good appetite and denies weight loss.  She has no chest pain, shortness of breath, cough, or hemoptysis.  She denies any nausea, vomiting, constipation, or diarrhea.  She has no urinary complaints.  Patient otherwise feels well and offers no further specific complaints today.  REVIEW OF SYSTEMS:   Review of Systems  Constitutional: Negative.  Negative for fever, malaise/fatigue and weight loss.  Respiratory: Negative.  Negative for cough, hemoptysis and shortness of breath.   Cardiovascular: Negative.  Negative for chest pain and leg swelling.  Gastrointestinal: Negative.  Negative for abdominal pain.  Genitourinary: Negative.  Negative for dysuria.  Musculoskeletal: Negative.  Negative for back pain.  Skin: Negative.  Negative for rash.  Neurological: Negative.  Negative for dizziness, focal weakness, weakness and headaches.  Psychiatric/Behavioral: The patient is nervous/anxious.     As per HPI. Otherwise, a complete review of systems is negative.  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[place])    Kidney stones, calcium oxalate     Lumbago    Nephrolithiasis    Osteoarthrosis    Ovarian failure    SOB (shortness of breath)     PAST SURGICAL HISTORY: Past Surgical History:  Procedure Laterality Date   BREAST BIOPSY Right 2014   neg- core   BREAST BIOPSY Right 12/21/2018   [place] bx, venus clip, pending path   BREAST BIOPSY Right 12/21/2018   [place] bx, ribbon clip, pending path   BREAST DUCTAL SYSTEM EXCISION     BREAST EXCISIONAL BIOPSY Right 2014   neg   CATARACT EXTRACTION     CHOLECYSTECTOMY     CYSTOSCOPY WITH STENT PLACEMENT Left 04/14/2015   Procedure: CYSTOSCOPY WITH STENT PLACEMENT;  Surgeon: Hollice Espy, MD;  Location: [facility] ORS;  Service: Urology;  Laterality: Left;   EXTRACORPOREAL SHOCK WAVE LITHOTRIPSY Left 09/29/2014   Procedure: EXTRACORPOREAL SHOCK WAVE LITHOTRIPSY (ESWL);  Surgeon: Hollice Espy, MD;  Location: [facility];  Service: Urology;  Laterality: Left;   LAPAROSCOPIC OOPHERECTOMY Left    TONSILLECTOMY     URETEROSCOPY WITH HOLMIUM LASER LITHOTRIPSY Left 04/14/2015   Procedure: URETEROSCOPY WITH HOLMIUM LASER LITHOTRIPSY;  Surgeon: Hollice Espy, MD;  Location: [facility] OR[facility];  Service: Urology;  Laterality: Left;    FAMILY HISTORY: Family History  Problem Relation Age of Onset   Breast cancer Maternal Grandmother 33   Colon cancer Mother    Lung cancer Father     ADVANCED DIRECTIVES (Y/N):  N  HEALTH MAINTENANCE: Social History   Tobacco Use   Smoking status: Former Smoker    Packs/day: 1.00    Years: 30.00    Pack  years: 30.00    Quit date: 04/11/1995    Years since quitting: 23.7   Smokeless tobacco: Never Used  Substance Use Topics   Alcohol use: No    Alcohol/week: 0.0 standard drinks   Drug use: No     Colonoscopy:  PAP:  Bone density:  Lipid panel:  No Known Allergies  Current Outpatient Medications  Medication Sig Dispense Refill   cholecalciferol (VITAMIN D) 1000 UNITS tablet Take 1,000 Units by mouth daily. At 8 pm     Ferrous  Gluconate 324 (37.5 Fe) MG TABS Take 1 tablet by mouth 2 (two) times daily.     furosemide (LASIX) 20 MG tablet Take 20 mg by mouth 2 (two) times daily as needed.  5   glimepiride (AMARYL) 4 MG tablet Take 1 tablet (4 mg total) by mouth 2 (two) times daily with a meal. 180 tablet 1   insulin detemir (LEVEMIR) 100 UNIT/ML injection Inject 75 units into the skin daily after supper 21 mL 3   LEVEMIR FLEXTOUCH 100 UNIT/ML Pen INJECT SUB-Q 75 UNITS AT BEDTIME ....WITH NEEDLES FOR 250.03 15 mL 3   lisinopril (PRINIVIL,ZESTRIL) 20 MG tablet Take 20 mg by mouth daily.     loperamide (IMODIUM A-D) 2 MG tablet Take 2 mg by mouth as needed for diarrhea or loose stools.     Multiple Vitamin (MULTIVITAMIN) tablet Take 1 tablet by mouth daily at 12 noon.     NEOMYCIN-POLYMYXIN-HYDROCORTISONE (CORTISPORIN) 1 % SOLN otic solution Reported on 05/26/2015     pantoprazole (PROTONIX) 40 MG tablet TAKE 1 TABLET(S) BY MOUTH DAILY 90 tablet 1   pregabalin (LYRICA) 100 MG capsule Take 1 capsule (100 mg total) by mouth 3 (three) times daily. 90 capsule 3   rosuvastatin (CRESTOR) 20 MG tablet TAKE 1 TABLET BY MOUTH EVERYDAY AT BEDTIME 90 tablet 3   traMADol-acetaminophen (ULTRACET) 37.5-325 MG tablet Take 1 tablet by mouth 2 (two) times daily as needed for moderate pain. 60 tablet 2   TRULICITY 1.5 FI/4.3PI SOPN Inject 1.39m [place] once weekly 4 pen 5   verapamil (CALAN) 40 MG tablet TAKE 1 TABLET(S) BY MOUTH TWICE A DAY FOR HYPERTENSION 60 tablet 4   aspirin EC 81 MG tablet Take 81 mg by mouth daily. Reported on 04/07/2015     cetirizine (ZYRTEC) 10 MG tablet Take 10 mg by mouth as needed for allergies. Reported on 05/26/2015     nystatin cream (MYCOSTATIN) Apply 1 application topically 2 (two) times daily. (Patient not taking: Reported on 12/30/2018) 30 g 3   No current facility-administered medications for this visit.     OBJECTIVE: Vitals:   12/31/18 1500  BP: (!) 156/55  Pulse: 68  Temp: 98.8 F (37.1  C)  SpO2: 100%     Body mass index is 40.08 kg/m.    ECOG FS:0 - Asymptomatic  General: Well-developed, well-nourished, no acute distress. Eyes: Pink conjunctiva, anicteric sclera. HEENT: Normocephalic, moist mucous membranes, clear oropharnyx. Breasts: No palpable abnormality. Lungs: Clear to auscultation bilaterally. Heart: Regular rate and rhythm. No rubs, murmurs, or gallops. Abdomen: Soft, nontender, nondistended. No organomegaly noted, normoactive bowel sounds. Musculoskeletal: No edema, cyanosis, or clubbing. Neuro: Alert, answering all questions appropriately. Cranial nerves grossly intact. Skin: No rashes or petechiae noted. Psych: Normal affect. Lymphatics: No cervical, calvicular, axillary or inguinal LAD.   LAB RESULTS:  Lab Results  Component Value Date   NA 141 03/08/2013   K 4.1 03/08/2013   CL 106 03/08/2013   CO2 30  03/08/2013   GLUCOSE 170 (H) 03/08/2013   BUN 26 (H) 03/08/2013   CREATININE 1.47 (H) 03/08/2013   CALCIUM 9.7 03/08/2013   PROT 7.7 07/23/2011   ALBUMIN 3.5 07/23/2011   AST 16 07/23/2011   ALT 24 07/23/2011   ALKPHOS 85 07/23/2011   BILITOT 0.4 07/23/2011   GFRNONAA 37 (L) 03/08/2013   GFRAA 43 (L) 03/08/2013    Lab Results  Component Value Date   WBC 5.7 03/08/2013   NEUTROABS 3.4 07/23/2011   HGB 11.6 (L) 03/08/2013   HCT 34.8 (L) 03/08/2013   MCV 87 03/08/2013   PLT 145 (L) 03/08/2013     STUDIES: US Breast Ltd Uni Right Inc Axilla  Result Date: 12/14/2018 CLINICAL DATA:  72 year old patient recalled from recent screening mammogram for evaluation of a right breast mass. Patient has a personal history of a right breast papilloma that was excised. EXAM: DIGITAL DIAGNOSTIC RIGHT MAMMOGRAM WITH CAD AND TOMO ULTRASOUND RIGHT BREAST COMPARISON:  Previous exam(s). ACR Breast Density Category b: There are scattered areas of fibroglandular density. FINDINGS: Additional mammographic views confirm a lobulated mass in the posterior third  of the outer right breast. The mass measures 9 mm and contains a central calcification. Mammographic images were processed with CAD. Targeted ultrasound is performed, showing an oval hypoechoic mass with partially circumscribed and partially indistinct margins in the 9:30 position right breast 9 cm from the nipple. A small central echogenic focus likely reflects a calcifications seen on mammography. There is mild posterior acoustic enhancement. No internal vascular flow identified. Ultrasound of the right axilla is negative for lymphadenopathy. IMPRESSION: Indeterminate 9 mm mass in the 9:30 position of the right breast. RECOMMENDATION: Ultrasound-guided core needle biopsy for pathologic diagnosis is recommended. I have discussed the findings and recommendations with the patient. Results were also provided in writing at the conclusion of the visit. If applicable, a reminder letter will be sent to the patient regarding the next appointment. BI-RADS CATEGORY  4: Suspicious. Electronically Signed   By: Curlene Dolphin M.D.   On: 12/14/2018 13:15   Mm Diag Breast Tomo Uni Right  Result Date: 12/14/2018 CLINICAL DATA:  73 year old patient recalled from recent screening mammogram for evaluation of a right breast mass. Patient has a personal history of a right breast papilloma that was excised. EXAM: DIGITAL DIAGNOSTIC RIGHT MAMMOGRAM WITH CAD AND TOMO ULTRASOUND RIGHT BREAST COMPARISON:  Previous exam(s). ACR Breast Density Category b: There are scattered areas of fibroglandular density. FINDINGS: Additional mammographic views confirm a lobulated mass in the posterior third of the outer right breast. The mass measures 9 mm and contains a central calcification. Mammographic images were processed with CAD. Targeted ultrasound is performed, showing an oval hypoechoic mass with partially circumscribed and partially indistinct margins in the 9:30 position right breast 9 cm from the nipple. A small central echogenic focus  likely reflects a calcifications seen on mammography. There is mild posterior acoustic enhancement. No internal vascular flow identified. Ultrasound of the right axilla is negative for lymphadenopathy. IMPRESSION: Indeterminate 9 mm mass in the 9:30 position of the right breast. RECOMMENDATION: Ultrasound-guided core needle biopsy for pathologic diagnosis is recommended. I have discussed the findings and recommendations with the patient. Results were also provided in writing at the conclusion of the visit. If applicable, a reminder letter will be sent to the patient regarding the next appointment. BI-RADS CATEGORY  4: Suspicious. Electronically Signed   By: Curlene Dolphin M.D.   On: 12/14/2018 13:15   Mm Clip  Placement Right  Result Date: 12/21/2018 CLINICAL DATA:  Post biopsy mammogram of the right breast for clip placement. EXAM: DIAGNOSTIC RIGHT MAMMOGRAM POST ULTRASOUND BIOPSY COMPARISON:  Previous exam(s). FINDINGS: Mammographic images were obtained following ultrasound guided biopsy of 2 right breast masses. The ribbon shaped biopsy marking clip is well positioned at the site of the biopsied mass at 9 o'clock. The venous shaped biopsy marking clip is well positioned in the slightly upper outer posterior right breast at the site of the mass at 9:30. However, neither of these clips correspond with the mass identified initially on the screening mammogram in the lateral posterior right breast. IMPRESSION: 1. Appropriate positioning of the ribbon shaped biopsy marking clip in the lateral right breast at the site of the mass at 9 o'clock. 2. Appropriate positioning of the venous shaped biopsy marking clip in the slightly upper outer posterior right breast at the site of the mass at 9:30. 3. Neither of the clips corresponds with the mass seen on the initial screening mammogram. We will wait for final pathology to determine how to proceed with evaluation of this mass. Final Assessment: Post Procedure Mammograms for  Marker Placement Electronically Signed   By: Ammie Ferrier M.D.   On: 12/21/2018 11:10   [place] Rt Breast Bx W Loc Dev 1st Lesion Img Bx Spec US Guide  Addendum Date: 12/23/2018   ADDENDUM REPORT: 12/23/2018 14:56 ADDENDUM: PATHOLOGY revealed: A. RIGHT BREAST, 9:00; - FIBROEPITHELIAL PROLIFERATION WITH SCLEROSIS, SEE COMMENT. - NEGATIVE FOR ATYPIA AND MALIGNANCY. B. RIGHT BREAST, 9:30; - DUCTAL CARCINOMA IN SITU, LOW GRADE, CRIBRIFORM PATTERN, INVOLVING AN INTRADUCTAL PAPILLOMA WITH SCLEROSIS. Comment: The differential diagnosis for the findings in part A includes a complex fibroadenoma, intraductal papilloma with sclerosis, or complex sclerosing lesion. Correlation with radiographic findings is required. Pathology results are CONCORDANT with imaging findings, per Dr. Ammie Ferrier. Pathology results were discussed with patient via telephone. The patient reported doing well after the biopsy with tenderness at the site. Post biopsy care instructions were reviewed and questions were answered. The patient was encouraged to call [facility] for any additional concerns. Recommendation: Stereotactic biopsy is recommended for third mass noted in RIGHT breast. Surgical referral is recommended for excision of both biopsied masses, however final recommendations (such as for additional sites of excision or for any further imaging, ie MRI if there is multifocal disease) will be based on the pathology of the recommended stereotactic biopsy. Request for surgical referral was relayed to nurse navigators at [facility] by Electa Sniff RN on 12/23/2018. Addendum by Electa Sniff RN on 12/23/2018. Electronically Signed   By: Ammie Ferrier M.D.   On: 12/23/2018 14:56   Result Date: 12/23/2018 CLINICAL DATA:  73 year old female presenting for ultrasound-guided biopsy of the right breast. EXAM: ULTRASOUND GUIDED RIGHT BREAST CORE NEEDLE BIOPSY COMPARISON:  Previous exam(s). FINDINGS: I met with the  patient and we discussed the procedure of ultrasound-guided biopsy, including benefits and alternatives. We discussed the high likelihood of a successful procedure. We discussed the risks of the procedure, including infection, bleeding, tissue injury, clip migration, and inadequate sampling. Informed written consent was given. The usual time-out protocol was performed immediately prior to the procedure. In preparation for the biopsy while scanning, an irregular hypoechoic mass was identified in the right breast at 9 o'clock, 9 cm from the nipple, which measured 8 x 3 x 6 mm. Together this mass and the mass at 9:30 span 2.7 cm of tissue. #1 Lesion quadrant: Upper-outer quadrant  Using sterile technique and 1% Lidocaine as local anesthetic, under direct ultrasound visualization, a 14 gauge spring-loaded device was used to perform biopsy of a mass in the right breast at 9 o'clock using a lateral approach. At the conclusion of the procedure a ribbon shaped tissue marker clip was deployed into the biopsy cavity. -------------------------------------------------------------------------------------------------------------------------------------------- #2 Lesion quadrant: Upper-outer quadrant Using sterile technique and 1% Lidocaine as local anesthetic, under direct ultrasound visualization, a 14 gauge spring-loaded device was used to perform biopsy of a right breast mass at 9:30 using an inferior approach. At the conclusion of the procedure a venous shaped tissue marker clip was deployed into the biopsy cavity. Follow up 2 view mammogram was performed and dictated separately. IMPRESSION: 1. Ultrasound guided biopsy of a right breast mass at 9 o'clock. No apparent complications. 2. Ultrasound guided biopsy of a right breast mass at 9:30. No apparent complications. Electronically Signed: By: Ammie Ferrier M.D. On: 12/21/2018 10:54   [place] Rt Breast Bx W Loc Dev Ea Add Lesion Img Bx Spec US Guide  Addendum Date: 12/23/2018    ADDENDUM REPORT: 12/23/2018 14:56 ADDENDUM: PATHOLOGY revealed: A. RIGHT BREAST, 9:00; - FIBROEPITHELIAL PROLIFERATION WITH SCLEROSIS, SEE COMMENT. - NEGATIVE FOR ATYPIA AND MALIGNANCY. B. RIGHT BREAST, 9:30; - DUCTAL CARCINOMA IN SITU, LOW GRADE, CRIBRIFORM PATTERN, INVOLVING AN INTRADUCTAL PAPILLOMA WITH SCLEROSIS. Comment: The differential diagnosis for the findings in part A includes a complex fibroadenoma, intraductal papilloma with sclerosis, or complex sclerosing lesion. Correlation with radiographic findings is required. Pathology results are CONCORDANT with imaging findings, per Dr. Ammie Ferrier. Pathology results were discussed with patient via telephone. The patient reported doing well after the biopsy with tenderness at the site. Post biopsy care instructions were reviewed and questions were answered. The patient was encouraged to call [facility] for any additional concerns. Recommendation: Stereotactic biopsy is recommended for third mass noted in RIGHT breast. Surgical referral is recommended for excision of both biopsied masses, however final recommendations (such as for additional sites of excision or for any further imaging, ie MRI if there is multifocal disease) will be based on the pathology of the recommended stereotactic biopsy. Request for surgical referral was relayed to nurse navigators at [facility] by Electa Sniff RN on 12/23/2018. Addendum by Electa Sniff RN on 12/23/2018. Electronically Signed   By: Ammie Ferrier M.D.   On: 12/23/2018 14:56   Result Date: 12/23/2018 CLINICAL DATA:  73 year old female presenting for ultrasound-guided biopsy of the right breast. EXAM: ULTRASOUND GUIDED RIGHT BREAST CORE NEEDLE BIOPSY COMPARISON:  Previous exam(s). FINDINGS: I met with the patient and we discussed the procedure of ultrasound-guided biopsy, including benefits and alternatives. We discussed the high likelihood of a successful procedure. We discussed the  risks of the procedure, including infection, bleeding, tissue injury, clip migration, and inadequate sampling. Informed written consent was given. The usual time-out protocol was performed immediately prior to the procedure. In preparation for the biopsy while scanning, an irregular hypoechoic mass was identified in the right breast at 9 o'clock, 9 cm from the nipple, which measured 8 x 3 x 6 mm. Together this mass and the mass at 9:30 span 2.7 cm of tissue. #1 Lesion quadrant: Upper-outer quadrant Using sterile technique and 1% Lidocaine as local anesthetic, under direct ultrasound visualization, a 14 gauge spring-loaded device was used to perform biopsy of a mass in the right breast at 9 o'clock using a lateral approach. At the conclusion of the procedure a ribbon shaped tissue marker clip  was deployed into the biopsy cavity. -------------------------------------------------------------------------------------------------------------------------------------------- #2 Lesion quadrant: Upper-outer quadrant Using sterile technique and 1% Lidocaine as local anesthetic, under direct ultrasound visualization, a 14 gauge spring-loaded device was used to perform biopsy of a right breast mass at 9:30 using an inferior approach. At the conclusion of the procedure a venous shaped tissue marker clip was deployed into the biopsy cavity. Follow up 2 view mammogram was performed and dictated separately. IMPRESSION: 1. Ultrasound guided biopsy of a right breast mass at 9 o'clock. No apparent complications. 2. Ultrasound guided biopsy of a right breast mass at 9:30. No apparent complications. Electronically Signed: By: Ammie Ferrier M.D. On: 12/21/2018 10:54    ASSESSMENT: Low-grade DCIS of the right breast.  PLAN:    1. Low-grade DCIS of the right breast: Current standard of care is a lumpectomy followed by adjuvant XRT and 5 years of tamoxifen.  Since patient has a low-grade DCIS, we discussed the possibility of  enrolling in the COMET trial.  She does not have an invasive component, therefore chemotherapy is not necessary.  She has an appointment with surgery later today to further discuss treatment options.  She also has a second biopsy scheduled for tomorrow to assess an additional lesion.  She is highly anxious and is not ready to make a decision regarding enrollment.  If she elects not to enroll in the clinical trial or is randomized to surgery, patient will follow-up 1 to 2 weeks after her surgery.  If she does not require surgery, she will follow-up in the next several weeks for further evaluation and consultation with radiation oncology.  Patient will require 5 years tamoxifen at the conclusion of treatments.  I spent a total of 60 minutes face-to-face with the patient of which greater than 50% of the visit was spent in counseling and coordination of care as detailed above.   Patient expressed understanding and was in agreement with this plan. She also understands that She can call clinic at any time with any questions, concerns, or complaints.   Cancer Staging Ductal carcinoma in situ (DCIS) of right breast Staging form: Breast, AJCC 8th Edition - Clinical stage from 01/01/2019: Stage 0 (cTis (DCIS), cN0, cM0, G1, ER+, PR: Not Assessed, HER2: Not Assessed) - Signed by Lloyd Huger, MD on 01/01/2019   Lloyd Huger, MD   01/01/2019 6:41 AM

## 2018-12-30 ENCOUNTER — Other Ambulatory Visit: Payer: Self-pay

## 2018-12-30 LAB — SURGICAL PATHOLOGY

## 2018-12-30 NOTE — Progress Notes (Signed)
Patient is coming in for new patient appointment. She mentions that her appetite has not been too good.

## 2018-12-31 ENCOUNTER — Encounter: Payer: Self-pay | Admitting: Oncology

## 2018-12-31 ENCOUNTER — Other Ambulatory Visit: Payer: Self-pay

## 2018-12-31 ENCOUNTER — Inpatient Hospital Stay: Payer: Medicare HMO | Attending: Oncology | Admitting: Oncology

## 2018-12-31 VITALS — BP 156/55 | HR 68 | Temp 98.8°F | Ht 62.0 in | Wt 219.1 lb

## 2018-12-31 DIAGNOSIS — R0609 Other forms of dyspnea: Secondary | ICD-10-CM | POA: Diagnosis not present

## 2018-12-31 DIAGNOSIS — Z803 Family history of malignant neoplasm of breast: Secondary | ICD-10-CM | POA: Diagnosis not present

## 2018-12-31 DIAGNOSIS — D0511 Intraductal carcinoma in situ of right breast: Secondary | ICD-10-CM

## 2018-12-31 DIAGNOSIS — E782 Mixed hyperlipidemia: Secondary | ICD-10-CM | POA: Diagnosis not present

## 2018-12-31 DIAGNOSIS — E119 Type 2 diabetes mellitus without complications: Secondary | ICD-10-CM

## 2018-12-31 DIAGNOSIS — F419 Anxiety disorder, unspecified: Secondary | ICD-10-CM | POA: Diagnosis not present

## 2018-12-31 DIAGNOSIS — Z87891 Personal history of nicotine dependence: Secondary | ICD-10-CM

## 2018-12-31 DIAGNOSIS — Z800 Family history of malignant neoplasm of digestive organs: Secondary | ICD-10-CM

## 2018-12-31 DIAGNOSIS — I10 Essential (primary) hypertension: Secondary | ICD-10-CM

## 2018-12-31 DIAGNOSIS — Z79899 Other long term (current) drug therapy: Secondary | ICD-10-CM | POA: Diagnosis not present

## 2018-12-31 DIAGNOSIS — Z801 Family history of malignant neoplasm of trachea, bronchus and lung: Secondary | ICD-10-CM

## 2019-01-01 ENCOUNTER — Ambulatory Visit
Admission: RE | Admit: 2019-01-01 | Discharge: 2019-01-01 | Disposition: A | Discharge status: Home / Self Care | Payer: Medicare HMO | Source: Ambulatory Visit | Attending: Nurse Practitioner | Admitting: Nurse Practitioner

## 2019-01-01 DIAGNOSIS — R928 Other abnormal and inconclusive findings on diagnostic imaging of breast: Secondary | ICD-10-CM | POA: Diagnosis not present

## 2019-01-01 DIAGNOSIS — N6310 Unspecified lump in the right breast, unspecified quadrant: Secondary | ICD-10-CM | POA: Diagnosis not present

## 2019-01-01 DIAGNOSIS — D0511 Intraductal carcinoma in situ of right breast: Secondary | ICD-10-CM | POA: Insufficient documentation

## 2019-01-01 DIAGNOSIS — N6313 Unspecified lump in the right breast, lower outer quadrant: Secondary | ICD-10-CM | POA: Diagnosis not present

## 2019-01-01 DIAGNOSIS — N6081 Other benign mammary dysplasias of right breast: Secondary | ICD-10-CM | POA: Diagnosis not present

## 2019-01-01 HISTORY — PX: BREAST BIOPSY: SHX20

## 2019-01-04 LAB — SURGICAL PATHOLOGY

## 2019-01-06 ENCOUNTER — Other Ambulatory Visit: Payer: Self-pay

## 2019-01-06 DIAGNOSIS — E1142 Type 2 diabetes mellitus with diabetic polyneuropathy: Secondary | ICD-10-CM

## 2019-01-06 DIAGNOSIS — IMO0002 Reserved for concepts with insufficient information to code with codable children: Secondary | ICD-10-CM

## 2019-01-06 MED ORDER — INSULIN DETEMIR 100 UNIT/ML ~~LOC~~ SOLN: SUBCUTANEOUS | 3 refills | Status: DC

## 2019-01-07 ENCOUNTER — Encounter: Payer: Self-pay | Admitting: Nurse Practitioner

## 2019-01-07 ENCOUNTER — Other Ambulatory Visit: Payer: Self-pay

## 2019-01-07 ENCOUNTER — Ambulatory Visit (INDEPENDENT_AMBULATORY_CARE_PROVIDER_SITE_OTHER): Payer: Medicare HMO | Admitting: Nurse Practitioner

## 2019-01-07 VITALS — BP 148/70 | HR 59 | Temp 97.3°F | Resp 16 | Ht 62.0 in | Wt 220.0 lb

## 2019-01-07 DIAGNOSIS — D0511 Intraductal carcinoma in situ of right breast: Secondary | ICD-10-CM | POA: Diagnosis not present

## 2019-01-07 DIAGNOSIS — M064 Inflammatory polyarthropathy: Secondary | ICD-10-CM | POA: Diagnosis not present

## 2019-01-07 DIAGNOSIS — I10 Essential (primary) hypertension: Secondary | ICD-10-CM | POA: Diagnosis not present

## 2019-01-07 DIAGNOSIS — E1165 Type 2 diabetes mellitus with hyperglycemia: Secondary | ICD-10-CM | POA: Insufficient documentation

## 2019-01-07 LAB — POCT GLYCOSYLATED HEMOGLOBIN (HGB A1C): Hemoglobin A1C: 7.5 % — AB (ref 4.0–5.6)

## 2019-01-07 MED ORDER — LEVEMIR FLEXTOUCH 100 UNIT/ML ~~LOC~~ SOPN: 75.0000 [IU] | PEN_INJECTOR | Freq: Every day | SUBCUTANEOUS | 3 refills | Status: DC

## 2019-01-07 MED ORDER — TRULICITY 1.5 MG/0.5ML ~~LOC~~ SOAJ: SUBCUTANEOUS | 5 refills | Status: DC

## 2019-01-07 NOTE — Progress Notes (Signed)
[facility] [place], [place] [address]  Internal MEDICINE  Office Visit Note  Patient Name: Kara [name]  [id number]  [id number]  Date of Service: 01/07/2019  Chief Complaint  Patient presents with  . Diabetes  . Hypertension  . Hyperlipidemia  . Quality Metric Gaps    discuss mammogram    The patient is here for routine follow up of diabetes. Blood sugars are stable, improved slightly since most recent visits. HgbA1c is 7.5 today. Blood pressure is slightly elevated. She is going through some personal stress. Recently diagnosed with ductal carcinoma in situ of the right breast. She is expecting to have removal of the area of concern within the next two weeks. Will have this followed by six weeks of radiation treatments. Family is very supportive.       Current Medication: Outpatient Encounter Medications as of 01/07/2019  Medication Sig Note  . aspirin EC 81 MG tablet Take 81 mg by mouth daily. Reported on 04/07/2015   . cetirizine (ZYRTEC) 10 MG tablet Take 10 mg by mouth as needed for allergies. Reported on 05/26/2015   . cholecalciferol (VITAMIN D) 1000 UNITS tablet Take 1,000 Units by mouth daily. At 8 pm   . Ferrous Gluconate 324 (37.5 Fe) MG TABS Take 1 tablet by mouth 2 (two) times daily.   . furosemide (LASIX) 20 MG tablet Take 20 mg by mouth 2 (two) times daily as needed.   Marland Kitchen glimepiride (AMARYL) 4 MG tablet Take 1 tablet (4 mg total) by mouth 2 (two) times daily with a meal.   . lisinopril (PRINIVIL,ZESTRIL) 20 MG tablet Take 20 mg by mouth daily.   Marland Kitchen loperamide (IMODIUM A-D) 2 MG tablet Take 2 mg by mouth as needed for diarrhea or loose stools.   . Multiple Vitamin (MULTIVITAMIN) tablet Take 1 tablet by mouth daily at 12 noon.   . pantoprazole (PROTONIX) 40 MG tablet TAKE 1 TABLET(S) BY MOUTH DAILY   . pregabalin (LYRICA) 100 MG capsule Take 1 capsule (100 mg total) by mouth 3 (three) times daily.   . rosuvastatin (CRESTOR) 20 MG tablet TAKE  1 TABLET BY MOUTH EVERYDAY AT BEDTIME   . traMADol-acetaminophen (ULTRACET) 37.5-325 MG tablet Take 1 tablet by mouth 2 (two) times daily as needed for moderate pain.   . TRULICITY 1.5 YK/5.9DJ SOPN Inject 1.5mg  [place] once weekly   . verapamil (CALAN) 40 MG tablet TAKE 1 TABLET(S) BY MOUTH TWICE A DAY FOR HYPERTENSION   . [DISCONTINUED] insulin detemir (LEVEMIR) 100 UNIT/ML injection Inject 75 units into the skin daily after supper   . [DISCONTINUED] LEVEMIR FLEXTOUCH 100 UNIT/ML Pen INJECT SUB-Q 75 UNITS AT BEDTIME ....WITH NEEDLES FOR 250.03   . [DISCONTINUED] TRULICITY 1.5 TT/0.1XB SOPN Inject 1.5mg  [place] once weekly   . [DISCONTINUED] NEOMYCIN-POLYMYXIN-HYDROCORTISONE (CORTISPORIN) 1 % SOLN otic solution Reported on 05/26/2015 05/26/2015: Received from: External Pharmacy  . [DISCONTINUED] nystatin cream (MYCOSTATIN) Apply 1 application topically 2 (two) times daily. (Patient not taking: Reported on 12/30/2018)    No facility-administered encounter medications on file as of 01/07/2019.     Surgical History: Past Surgical History:  Procedure Laterality Date  . BREAST BIOPSY Right 2014   neg- core  . BREAST BIOPSY Right 12/21/2018   [place] bx, venus clip,  DUCTAL CARCINOMA IN SITU  . BREAST BIOPSY Right 12/21/2018   [place] bx, ribbon clip,  FIBROEPITHELIAL PROLIFERATION WITH SCLEROSIS  . BREAST BIOPSY Right 01/01/2019   Affirm bx "X" clip-path pending  . BREAST  DUCTAL SYSTEM EXCISION    . BREAST EXCISIONAL BIOPSY Right 2014   neg  . CATARACT EXTRACTION    . CHOLECYSTECTOMY    . CYSTOSCOPY WITH STENT PLACEMENT Left 04/14/2015   Procedure: CYSTOSCOPY WITH STENT PLACEMENT;  Surgeon: Hollice Espy, MD;  Location: [facility] ORS;  Service: Urology;  Laterality: Left;  . EXTRACORPOREAL SHOCK WAVE LITHOTRIPSY Left 09/29/2014   Procedure: EXTRACORPOREAL SHOCK WAVE LITHOTRIPSY (ESWL);  Surgeon: Hollice Espy, MD;  Location: [facility] ORS;  Service: Urology;  Laterality: Left;  . LAPAROSCOPIC OOPHERECTOMY Left   .  TONSILLECTOMY    . URETEROSCOPY WITH HOLMIUM LASER LITHOTRIPSY Left 04/14/2015   Procedure: URETEROSCOPY WITH HOLMIUM LASER LITHOTRIPSY;  Surgeon: Hollice Espy, MD;  Location: [facility];  Service: Urology;  Laterality: Left;    Medical History: Past Medical History:  Diagnosis Date  . Arthropathy   . Benign neoplasm of breast   . Chest pain   . Diabetes ([place])   . Diabetic retinopathy ([place])   . Edema   . Hyperlipemia   . Hypersomnia with sleep apnea   . Hypertension   . 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 Kidney stones, calcium oxalate   . Lumbago   . Nephrolithiasis   . Osteoarthrosis   . Ovarian failure   . SOB (shortness of breath)     Family History: Family History  Problem Relation Age of Onset  . Breast cancer Maternal Grandmother 47  . Colon cancer Mother   . Lung cancer Father     Social History   Socioeconomic History  . Marital status: Married    Spouse name: Not on file  . Number of children: Not on file  . Years of education: Not on file  . Highest education level: Not on file  Occupational History  . Not on file  Social Needs  . Financial resource strain: Not on file  . Food insecurity    Worry: Not on file    Inability: Not on file  . Transportation needs    Medical: Not on file    Non-medical: Not on file  Tobacco Use  . Smoking status: Former Smoker    Packs/day: 1.00    Years: 30.00    Pack years: 30.00    Quit date: 04/11/1995    Years since quitting: 23.7  . Smokeless tobacco: Never Used  Substance and Sexual Activity  . Alcohol use: No    Alcohol/week: 0.0 standard drinks  . Drug use: No  . Sexual activity: Not on file  Lifestyle  . Physical activity    Days per week: Not on file    Minutes per session: Not on file  . Stress: Not on file  Relationships  . Social Herbalist on phone: Not on file    Gets together: Not on file    Attends religious service: Not on file    Active member of club or  organization: Not on file    Attends meetings of clubs or organizations: Not on file    Relationship status: Not on file  . Intimate partner violence    Fear of current or ex partner: Not on file    Emotionally abused: Not on file    Physically abused: Not on file    Forced sexual activity: Not on file  Other Topics Concern  . Not on file  Social History Narrative  . Not on file      Review of Systems  Constitutional: Negative for  activity change, chills, fatigue and unexpected weight change.  HENT: Negative for congestion, postnasal drip, rhinorrhea, sneezing and sore throat.   Eyes: Positive for visual disturbance.  Respiratory: Negative for cough, chest tightness, shortness of breath and wheezing.   Cardiovascular: Negative for chest pain and palpitations.  Gastrointestinal: Negative for abdominal pain, constipation, diarrhea, nausea and vomiting.       .   Endocrine: Negative for cold intolerance, heat intolerance, polydipsia and polyuria.       Improving blood sugars .  Musculoskeletal: Positive for arthralgias, back pain and myalgias. Negative for joint swelling and neck pain.       Ring finger of right hand  Skin: Negative for rash.  Allergic/Immunologic: Negative for environmental allergies.  Neurological: Positive for dizziness. Negative for tremors, numbness and headaches.  Hematological: Negative for adenopathy. Does not bruise/bleed easily.  Psychiatric/Behavioral: Negative for behavioral problems (Depression), sleep disturbance and suicidal ideas. The patient is nervous/anxious.     Today's Vitals   01/07/19 0922  BP: (!) 148/70  Pulse: (!) 59  Resp: 16  Temp: (!) 97.3 F (36.3 C)  SpO2: 97%  Weight: 220 lb (99.8 kg)  Height: 5\' 2"  (1.575 m)   Body mass index is 40.24 kg/m.  Physical Exam Vitals signs and nursing note reviewed.  Constitutional:      General: She is not in acute distress.    Appearance: Normal appearance. She is well-developed. She is  obese. She is not diaphoretic.  HENT:     Head: Normocephalic and atraumatic.     Nose: Nose normal.     Mouth/Throat:     Pharynx: No oropharyngeal exudate.  Eyes:     Conjunctiva/sclera: Conjunctivae normal.     Pupils: Pupils are equal, round, and reactive to light.  Neck:     Musculoskeletal: Normal range of motion and neck supple.     Thyroid: No thyromegaly.     Vascular: No carotid bruit or JVD.     Trachea: No tracheal deviation.  Cardiovascular:     Rate and Rhythm: Normal rate and regular rhythm.     Heart sounds: Normal heart sounds. No murmur. No friction rub. No gallop.   Pulmonary:     Effort: Pulmonary effort is normal. No respiratory distress.     Breath sounds: Normal breath sounds. No wheezing or rales.  Chest:     Chest wall: No tenderness.  Abdominal:     General: Bowel sounds are normal.     Palpations: Abdomen is soft.     Tenderness: There is no abdominal tenderness.  Musculoskeletal: Normal range of motion.  Lymphadenopathy:     Cervical: No cervical adenopathy.  Skin:    General: Skin is warm and dry.     Capillary Refill: Capillary refill takes less than 2 seconds.  Neurological:     Mental Status: She is alert and oriented to person, place, and time.     Cranial Nerves: No cranial nerve deficit.     Comments: Mild tremor of both outstretched hands, worse on right side then left. Resolves with rest.   Psychiatric:        Behavior: Behavior normal.        Thought Content: Thought content normal.        Judgment: Judgment normal.    Assessment/Plan:  1. Type 2 diabetes mellitus with hyperglycemia, unspecified whether long term insulin use (HCC) - POCT HgB A1C 7.5 today, improved from last visit. Continue diabetic medication as well as lyrica  as prescribed.  - TRULICITY 1.5 VZ/8.5YI SOPN; Inject 1.5mg  [place] once weekly  Dispense: 4 pen; Refill: 5  2. Ductal carcinoma in situ (DCIS) of right breast New diagnosis. Will be scheduled for surgical  removal of affected area in next two weeks. Will then have six weeks radiation.   3. Essential hypertension Generally stable. Continue bp medication as prescribed   4. Inflammatory polyarthropathy ([place]) Continue tramadol/APAP as needed and as prescribed   General Counseling: Earnestene verbalizes understanding of the findings of todays visit and agrees with plan of treatment. I have discussed any further diagnostic evaluation that may be needed or ordered today. We also reviewed her medications today. she has been encouraged to call the office with any questions or concerns that should arise related to todays visit.  Diabetes Counseling:  1. Addition of ACE inh/ ARB'S for nephroprotection. Microalbumin is updated  2. Diabetic foot care, prevention of complications. Podiatry consult 3. Exercise and lose weight.  4. Diabetic eye examination, Diabetic eye exam is updated  5. Monitor blood sugar closlely. nutrition counseling.  6. Sign and symptoms of hypoglycemia including shaking sweating,confusion and headaches.  This patient was seen by Leretha Pol FNP Collaboration with Dr Lavera Guise as a part of collaborative care agreement  Orders Placed This Encounter  Procedures  . POCT HgB A1C    Meds ordered this encounter  Medications  . TRULICITY 1.5 FO/2.7XA SOPN    Sig: Inject 1.5mg  [place] once weekly    Dispense:  4 pen    Refill:  5    Order Specific Question:   Supervising Provider    Answer:   Lavera Guise [[id number]]    Time spent: 74 Minutes      Dr Lavera Guise Internal medicine

## 2019-01-12 ENCOUNTER — Other Ambulatory Visit: Payer: Self-pay | Admitting: General Surgery

## 2019-01-12 DIAGNOSIS — D0511 Intraductal carcinoma in situ of right breast: Secondary | ICD-10-CM

## 2019-01-13 ENCOUNTER — Ambulatory Visit: Payer: Self-pay | Admitting: General Surgery

## 2019-01-13 NOTE — H&P (Signed)
HISTORY OF PRESENT ILLNESS:   Ms.Williamsreports she had her screening mammogram. Show suspicious lesion on the right breast. This led to diagnostic mammogram and ultrasound. It was confirmed a suspicious lesion on the 9 o'clock position of the right breast. This led to biopsy. 1 of the areas shows DCIS low-grade. There is a third area of concern that was biopsied and found to be benign calcifications.  Patient was oriented about a trial of radiation therapy to low-grade DCIS but she declined and consented to proceed with standard therapy with partial mastectomy.  Today patient denies any change since last visit.  She denies any breast pain.  There is no pain radiation.  There is no alleviating or aggravating factor.  There has been no nipple discharge.  There has been no skin changes.  Family history of breast cancer:Her mother and her 62s Family history of other cancers:Father lung cancer Menarche:13 Menopause:51 Used OCP:For 3 years Used estrogen and progesterone therapy:None History of Radiation to the chest:None Number of pregnancies: 3      PAST MEDICAL HISTORY:      Past Medical History:  Diagnosis Date  . Diabetes mellitus, type 2 (CMS-HCC)   . History of cancer    Breast  . Hyperlipidemia   . Hypertension   . Irritable bowel syndrome with diarrhea         PAST SURGICAL HISTORY:        Past Surgical History:  Procedure Laterality Date  . breast cyst removal  2014  . BREAST EXCISIONAL BIOPSY Right 12/21/2018  . CATARACT EXTRACTION Bilateral 2012  . CHOLECYSTECTOMY  2013  . HYSTERECTOMY N/A 1991   [place]  . LITHOTRIPSY  04/14/2015   Ureteroscopy  . LITHOTRIPSY  09/29/2014   Extracorporal  . toncilectomy Bilateral 1953         MEDICATIONS:  Encounter Medications        Outpatient Encounter Medications as of 01/12/2019  Medication Sig Dispense Refill  . aflibercept (EYLEA) 2 mg/0.05 mL injection     . aspirin 81 MG EC tablet  Take by mouth Take 81 mg by mouth daily. Reported on 04/07/2015    . bevacizumab (AVASTIN IV) Inject into the vein    . cetirizine (ZYRTEC) 10 MG tablet Take by mouth Take 10 mg by mouth as needed for allergies    . cholecalciferol (VITAMIN D3) 1000 unit tablet Take by mouth Take 1,000 Units by mouth daily. At 8 pm    . dulaglutide (TRULICITY) 1.5 DE/0.8 mL subcutaneous injection Inject 1.5mg  [place] once weekly    . ferrous gluconate (FERGON) 324 mg (37.5 mg iron) Tab tablet Take 324 mg by mouth Take one tablet by mouth twice daily      . FUROsemide (LASIX) 20 MG tablet Take by mouth Take 20 mg by mouth 2 (two) times daily as needed    . glimepiride (AMARYL) 4 MG tablet Take by mouth Take 1 tablet (4 mg total) by mouth 2 (two) times daily with a meal.    . glucosam/chond/hyalu/CF borate (MOVE FREE JOINT HEALTH ORAL) Take by mouth    . Herbal Supplement Hemp Oil    . insulin DETEMIR (LEVEMIR) injection (concentration 100 units/mL) Inject 75 units into the skin daily after supper    . lisinopriL (ZESTRIL) 20 MG tablet Take by mouth Take 20 mg by mouth daily.    Marland Kitchen loperamide (IMODIUM A-D) 2 mg tablet Take by mouth Take 2 mg by mouth as needed for diarrhea or loose stools.    Marland Kitchen  multivitamin (MULTIVITAMIN) tablet Take by mouth Take 1 tablet by mouth daily at 12 noon.    . NON FORMULARY Blood Sugar Support- Take 1 tablet twice a day    . NON FORMULARY Apple Cider Vinegar for weight management    . pantoprazole (PROTONIX) 40 MG DR tablet TAKE 1 TABLET(S) BY MOUTH DAILY    . pregabalin (LYRICA) 100 MG capsule Take by mouth Take 1 capsule (100 mg total) by mouth 3 (three) times daily.    . rosuvastatin (CRESTOR) 20 MG tablet TAKE 1 TABLET BY MOUTH EVERYDAY AT BEDTIME    . traMADol-acetaminophen (ULTRACET) 37.5-325 mg tablet Take by mouth Take 1 tablet by mouth 2 (two) times daily as needed for moderate pain.    . verapamiL (CALAN) 40 MG tablet TAKE 1 TABLET(S) BY MOUTH  TWICE A DAY FOR HYPERTENSION     No facility-administered encounter medications on file as of 01/12/2019.        ALLERGIES:   Patient has no known allergies.   SOCIAL HISTORY:  Social History          Socioeconomic History  . Marital status: Married    Spouse name: Not on file  . Number of children: Not on file  . Years of education: Not on file  . Highest education level: Not on file  Occupational History  . Not on file  Social Needs  . Financial resource strain: Not on file  . Food insecurity    Worry: Not on file    Inability: Not on file  . Transportation needs    Medical: Not on file    Non-medical: Not on file  Tobacco Use  . Smoking status: Former Smoker    Quit date: 1996    Years since quitting: 24.7  . Smokeless tobacco: Never Used  Substance and Sexual Activity  . Alcohol use: Never    Frequency: Never  . Drug use: Not on file  . Sexual activity: Not on file  Other Topics Concern  . Not on file  Social History Narrative  . Not on file      FAMILY HISTORY:  History reviewed. No pertinent family history.   GENERAL REVIEW OF SYSTEMS:   General ROS: negative for - chills, fatigue, fever, weight gain or weight loss Allergy and Immunology ROS: negative for - hives  Hematological and Lymphatic ROS: negative for - bleeding problems or bruising, negative for palpable nodes Endocrine ROS: negative for - heat or cold intolerance, hair changes Respiratory ROS: negative for - cough, shortness of breath or wheezing Cardiovascular ROS: no chest pain or palpitations GI ROS: negative for nausea, vomiting, abdominal pain, diarrhea, constipation Musculoskeletal ROS: negative for - joint swelling or muscle pain Neurological ROS: negative for - confusion, syncope Dermatological ROS: negative for pruritus and rash  PHYSICAL EXAM:     Vitals:   01/12/19 1004  BP: 166/66  Pulse: 50  .  Ht:157.5 cm (5\' 2" ) Wt:98.9 kg (218 lb) ERD:EYCX  surface area is 2.08 meters squared. Body mass index is 39.87 kg/m.Marland Kitchen   GENERAL: Alert, active, oriented x3  HEENT: Pupils equal reactive to light. Extraocular movements are intact. Sclera clear. Palpebral conjunctiva normal red color.Pharynx clear.  NECK: Supple with no palpable mass and no adenopathy.  LUNGS: Sound clear with no rales rhonchi or wheezes.  HEART: Regular rhythm S1 and S2 without murmur.  BREAST: Right breast with resolving bruise.  There is no palpable mass.  There is no nipple discharge.  There is  no axillary adenopathy.  ABDOMEN: Soft and depressible, nontender with no palpable mass, no hepatomegaly.   EXTREMITIES: Well-developed well-nourished symmetrical with no dependent edema.  NEUROLOGICAL: Awake alert oriented, facial expression symmetrical, moving all extremities.      IMPRESSION:     Ductal carcinoma in situ (DCIS) of right breast [D05.11] -Low-grade  Patient was oriented again about the pathology results. Surgical alternatives were discussed with patient including partial vs total mastectomy. Surgical technique and post operative care was discussed with patient. Risk of surgery was discussed with patient including but not limited to: wound infection, seroma, hematoma, brachial plexopathy, mondor's disease (thrombosis of small veins of breast), chronic wound pain, breast lymphedema, altered sensation to the nipple and cosmesis among others.   Since this is a low-grade DCIS patient will benefit of right breast needle guided partial mastectomy.  At this moment there is no indication for sentinel needle biopsy.  Patient was oriented that if the DCIS is upgraded to invasive mammary carcinoma she will need a sentinel node biopsy for staging.           PLAN:  1. Right needle guided partial mastectomy (19301) 2. CBC, CMP 3. Stop taking aspirin 5 days before surgery 4. Contact us if has any question or concern.   Patient and her husband verbalized  understanding, all questions were answered, and were agreeable with the plan outlined above.   This was a 45-minute encounter most of the time counseling the patient and coordinating plan of care.  Herbert Pun, MD  Electronically signed by Herbert Pun, MD

## 2019-01-13 NOTE — H&P (View-Only) (Signed)
HISTORY OF PRESENT ILLNESS:   Ms.Williamsreports she had her screening mammogram. Show suspicious lesion on the right breast. This led to diagnostic mammogram and ultrasound. It was confirmed a suspicious lesion on the 9 o'clock position of the right breast. This led to biopsy. 1 of the areas shows DCIS low-grade. There is a third area of concern that was biopsied and found to be benign calcifications.  Patient was oriented about a trial of radiation therapy to low-grade DCIS but she declined and consented to proceed with standard therapy with partial mastectomy.  Today patient denies any change since last visit.  She denies any breast pain.  There is no pain radiation.  There is no alleviating or aggravating factor.  There has been no nipple discharge.  There has been no skin changes.  Family history of breast cancer:Her mother and her 32s Family history of other cancers:Father lung cancer Menarche:13 Menopause:51 Used OCP:For 3 years Used estrogen and progesterone therapy:None History of Radiation to the chest:None Number of pregnancies: 3      PAST MEDICAL HISTORY:      Past Medical History:  Diagnosis Date  . Diabetes mellitus, type 2 (CMS-HCC)   . History of cancer    Breast  . Hyperlipidemia   . Hypertension   . Irritable bowel syndrome with diarrhea         PAST SURGICAL HISTORY:        Past Surgical History:  Procedure Laterality Date  . breast cyst removal  2014  . BREAST EXCISIONAL BIOPSY Right 12/21/2018  . CATARACT EXTRACTION Bilateral 2012  . CHOLECYSTECTOMY  2013  . HYSTERECTOMY N/A 1991   [place]arcial  . LITHOTRIPSY  04/14/2015   Ureteroscopy  . LITHOTRIPSY  09/29/2014   Extracorporal  . toncilectomy Bilateral 1953         MEDICATIONS:  Encounter Medications        Outpatient Encounter Medications as of 01/12/2019  Medication Sig Dispense Refill  . aflibercept (EYLEA) 2 mg/0.05 mL injection     . aspirin 81 MG EC tablet  Take by mouth Take 81 mg by mouth daily. Reported on 04/07/2015    . bevacizumab (AVASTIN IV) Inject into the vein    . cetirizine (ZYRTEC) 10 MG tablet Take by mouth Take 10 mg by mouth as needed for allergies    . cholecalciferol (VITAMIN D3) 1000 unit tablet Take by mouth Take 1,000 Units by mouth daily. At 8 pm    . dulaglutide (TRULICITY) 1.5 NF/6.2 mL subcutaneous injection Inject 1.5mg  [place] once weekly    . ferrous gluconate (FERGON) 324 mg (37.5 mg iron) Tab tablet Take 324 mg by mouth Take one tablet by mouth twice daily      . FUROsemide (LASIX) 20 MG tablet Take by mouth Take 20 mg by mouth 2 (two) times daily as needed    . glimepiride (AMARYL) 4 MG tablet Take by mouth Take 1 tablet (4 mg total) by mouth 2 (two) times daily with a meal.    . glucosam/chond/hyalu/CF borate (MOVE FREE JOINT HEALTH ORAL) Take by mouth    . Herbal Supplement Hemp Oil    . insulin DETEMIR (LEVEMIR) injection (concentration 100 units/mL) Inject 75 units into the skin daily after supper    . lisinopriL (ZESTRIL) 20 MG tablet Take by mouth Take 20 mg by mouth daily.    Marland Kitchen loperamide (IMODIUM A-D) 2 mg tablet Take by mouth Take 2 mg by mouth as needed for diarrhea or loose stools.    Marland Kitchen  multivitamin (MULTIVITAMIN) tablet Take by mouth Take 1 tablet by mouth daily at 12 noon.    . NON FORMULARY Blood Sugar Support- Take 1 tablet twice a day    . NON FORMULARY Apple Cider Vinegar for weight management    . pantoprazole (PROTONIX) 40 MG DR tablet TAKE 1 TABLET(S) BY MOUTH DAILY    . pregabalin (LYRICA) 100 MG capsule Take by mouth Take 1 capsule (100 mg total) by mouth 3 (three) times daily.    . rosuvastatin (CRESTOR) 20 MG tablet TAKE 1 TABLET BY MOUTH EVERYDAY AT BEDTIME    . traMADol-acetaminophen (ULTRACET) 37.5-325 mg tablet Take by mouth Take 1 tablet by mouth 2 (two) times daily as needed for moderate pain.    . verapamiL (CALAN) 40 MG tablet TAKE 1 TABLET(S) BY MOUTH  TWICE A DAY FOR HYPERTENSION     No facility-administered encounter medications on file as of 01/12/2019.        ALLERGIES:   Patient has no known allergies.   SOCIAL HISTORY:  Social History          Socioeconomic History  . Marital status: Married    Spouse name: Not on file  . Number of children: Not on file  . Years of education: Not on file  . Highest education level: Not on file  Occupational History  . Not on file  Social Needs  . Financial resource strain: Not on file  . Food insecurity    Worry: Not on file    Inability: Not on file  . Transportation needs    Medical: Not on file    Non-medical: Not on file  Tobacco Use  . Smoking status: Former Smoker    Quit date: 1996    Years since quitting: 24.7  . Smokeless tobacco: Never Used  Substance and Sexual Activity  . Alcohol use: Never    Frequency: Never  . Drug use: Not on file  . Sexual activity: Not on file  Other Topics Concern  . Not on file  Social History Narrative  . Not on file      FAMILY HISTORY:  History reviewed. No pertinent family history.   GENERAL REVIEW OF SYSTEMS:   General ROS: negative for - chills, fatigue, fever, weight gain or weight loss Allergy and Immunology ROS: negative for - hives  Hematological and Lymphatic ROS: negative for - bleeding problems or bruising, negative for palpable nodes Endocrine ROS: negative for - heat or cold intolerance, hair changes Respiratory ROS: negative for - cough, shortness of breath or wheezing Cardiovascular ROS: no chest pain or palpitations GI ROS: negative for nausea, vomiting, abdominal pain, diarrhea, constipation Musculoskeletal ROS: negative for - joint swelling or muscle pain Neurological ROS: negative for - confusion, syncope Dermatological ROS: negative for pruritus and rash  PHYSICAL EXAM:     Vitals:   01/12/19 1004  BP: 166/66  Pulse: 50  .  Ht:157.5 cm (5\' 2" ) Wt:98.9 kg (218 lb) RWE:RXVQ  surface area is 2.08 meters squared. Body mass index is 39.87 kg/m.Marland Kitchen   GENERAL: Alert, active, oriented x3  HEENT: Pupils equal reactive to light. Extraocular movements are intact. Sclera clear. Palpebral conjunctiva normal red color.Pharynx clear.  NECK: Supple with no palpable mass and no adenopathy.  LUNGS: Sound clear with no rales rhonchi or wheezes.  HEART: Regular rhythm S1 and S2 without murmur.  BREAST: Right breast with resolving bruise.  There is no palpable mass.  There is no nipple discharge.  There is  no axillary adenopathy.  ABDOMEN: Soft and depressible, nontender with no palpable mass, no hepatomegaly.   EXTREMITIES: Well-developed well-nourished symmetrical with no dependent edema.  NEUROLOGICAL: Awake alert oriented, facial expression symmetrical, moving all extremities.      IMPRESSION:     Ductal carcinoma in situ (DCIS) of right breast [D05.11] -Low-grade  Patient was oriented again about the pathology results. Surgical alternatives were discussed with patient including partial vs total mastectomy. Surgical technique and post operative care was discussed with patient. Risk of surgery was discussed with patient including but not limited to: wound infection, seroma, hematoma, brachial plexopathy, mondor's disease (thrombosis of small veins of breast), chronic wound pain, breast lymphedema, altered sensation to the nipple and cosmesis among others.   Since this is a low-grade DCIS patient will benefit of right breast needle guided partial mastectomy.  At this moment there is no indication for sentinel needle biopsy.  Patient was oriented that if the DCIS is upgraded to invasive mammary carcinoma she will need a sentinel node biopsy for staging.           PLAN:  1. Right needle guided partial mastectomy (19301) 2. CBC, CMP 3. Stop taking aspirin 5 days before surgery 4. Contact us if has any question or concern.   Patient and her husband verbalized  understanding, all questions were answered, and were agreeable with the plan outlined above.   This was a 45-minute encounter most of the time counseling the patient and coordinating plan of care.  Herbert Pun, MD  Electronically signed by Herbert Pun, MD

## 2019-01-18 ENCOUNTER — Other Ambulatory Visit: Payer: Self-pay

## 2019-01-18 ENCOUNTER — Other Ambulatory Visit: Payer: Medicare HMO

## 2019-01-18 MED ORDER — PANTOPRAZOLE SODIUM 40 MG PO TBEC: DELAYED_RELEASE_TABLET | ORAL | 1 refills | Status: DC

## 2019-01-19 ENCOUNTER — Other Ambulatory Visit: Payer: Self-pay

## 2019-01-19 ENCOUNTER — Encounter
Admission: RE | Admit: 2019-01-19 | Discharge: 2019-01-19 | Disposition: A | Discharge status: Home / Self Care | Payer: Medicare HMO | Source: Ambulatory Visit | Attending: General Surgery | Admitting: General Surgery

## 2019-01-19 ENCOUNTER — Other Ambulatory Visit: Payer: Medicare HMO

## 2019-01-19 DIAGNOSIS — Z01818 Encounter for other preprocedural examination: Secondary | ICD-10-CM | POA: Insufficient documentation

## 2019-01-19 DIAGNOSIS — I10 Essential (primary) hypertension: Secondary | ICD-10-CM | POA: Insufficient documentation

## 2019-01-19 DIAGNOSIS — Z20828 Contact with and (suspected) exposure to other viral communicable diseases: Secondary | ICD-10-CM | POA: Insufficient documentation

## 2019-01-19 DIAGNOSIS — E118 Type 2 diabetes mellitus with unspecified complications: Secondary | ICD-10-CM | POA: Diagnosis not present

## 2019-01-19 HISTORY — DX: Chronic kidney disease, unspecified: N18.9

## 2019-01-19 HISTORY — DX: Dyspnea, unspecified: R06.00

## 2019-01-19 HISTORY — DX: Anemia, unspecified: D64.9

## 2019-01-19 HISTORY — DX: Personal history of urinary calculi: Z87.442

## 2019-01-19 LAB — SARS CORONAVIRUS 2 (TAT 6-24 HRS): SARS Coronavirus 2: NEGATIVE

## 2019-01-19 NOTE — Patient Instructions (Signed)
Your procedure is scheduled NK:[id number] 10/2  Report to Mammography. At 7:45   Remember: Instructions that are not followed completely may result in serious medical risk,  up to and including death, or upon the discretion of your surgeon and anesthesiologist your  surgery may need to be rescheduled.     _X__ 1. Do not eat food after midnight the night before your procedure.                 No gum chewing or hard candies. You may drink clear liquids up to 2 hours                 before you are scheduled to arrive for your surgery- DO not drink clear                 liquids within 2 hours of the start of your surgery.                 Clear Liquids include:  water,  Black Coffee or Tea (Do not add                 anything to coffee or tea).  __X__2.  On the morning of surgery brush your teeth with toothpaste and water, you                may rinse your mouth with mouthwash if you wish.  Do not swallow any toothpaste of mouthwash.     _X__ 3.  No Alcohol for 24 hours before or after surgery.   ___ 4.  Do Not Smoke or use e-cigarettes For 24 Hours Prior to Your Surgery.                 Do not use any chewable tobacco products for at least 6 hours prior to                 surgery.  ____  5.  Bring all medications with you on the day of surgery if instructed.   __x__  6.  Notify your doctor if there is any change in your medical condition      (cold, fever, infections).     Do not wear jewelry, make-up, hairpins, clips or nail polish. Do not wear lotions, powders, or perfumes. You may wear deodorant. Do not shave 48 hours prior to surgery. Men may shave face and neck. Do not bring valuables to the hospital.    [facility] is not responsible for any belongings or valuables.  Contacts, dentures or bridgework may not be worn into surgery. Leave your suitcase in the car. After surgery it may be brought to your room. For patients admitted to the hospital, discharge time  is determined by your treatment team.   Patients discharged the day of surgery will not be allowed to drive home.   Please read over the following fact sheets that you were given:    __x__ Take these medicines the morning of surgery with A SIP OF WATER:    1. pantoprazole (PROTONIX) 40 MG tablet  dose the night before and one the morning of surgery  2. pregabalin (LYRICA) 100 MG capsule  3. traMADol-acetaminophen (ULTRACET) 37.5-325 MG tablet  If needed  4.verapamil (CALAN) 40 MG tablet  5.  6.  ____ Fleet Enema (as directed)   _x___ Use CHG Soap as directed  ____ Use inhalers on the day of surgery  ____ Stop metformin 2 days prior to surgery  __x__ Take 1/2 of usual insulin dose the night before surgery. No insulin the morning          of surgery.   __x__ Stopped aspirin 1 week ago  ____ Stop Anti-inflammatories    __x__ Stop supplements until after surgery.  Blood sugar support,Glucos-Chond-Hyal Ac-Ca Fructo (MOVE FREE JOINT HEALTH ADVANCE)APPLE CIDER VINEGAR PO, TABS  ____ Bring C-Pap to the hospital.

## 2019-01-22 ENCOUNTER — Ambulatory Visit
Admission: RE | Admit: 2019-01-22 | Discharge: 2019-01-22 | Disposition: A | Discharge status: Home / Self Care | Payer: Medicare HMO | Source: Ambulatory Visit | Attending: General Surgery | Admitting: General Surgery

## 2019-01-22 ENCOUNTER — Ambulatory Visit
Admission: RE | Admit: 2019-01-22 | Discharge: 2019-01-22 | Disposition: A | Discharge status: Home / Self Care | Payer: Medicare HMO | Attending: General Surgery | Admitting: General Surgery

## 2019-01-22 ENCOUNTER — Encounter: Payer: Self-pay | Admitting: *Deleted

## 2019-01-22 ENCOUNTER — Ambulatory Visit: Payer: Medicare HMO | Admitting: Certified Registered"

## 2019-01-22 ENCOUNTER — Other Ambulatory Visit: Payer: Self-pay

## 2019-01-22 ENCOUNTER — Encounter
Admission: RE | Disposition: A | Discharge status: Home / Self Care | Payer: Self-pay | Source: Home / Self Care | Attending: General Surgery

## 2019-01-22 DIAGNOSIS — E782 Mixed hyperlipidemia: Secondary | ICD-10-CM | POA: Diagnosis not present

## 2019-01-22 DIAGNOSIS — G4730 Sleep apnea, unspecified: Secondary | ICD-10-CM | POA: Insufficient documentation

## 2019-01-22 DIAGNOSIS — Z9842 Cataract extraction status, left eye: Secondary | ICD-10-CM | POA: Diagnosis not present

## 2019-01-22 DIAGNOSIS — G709 Myoneural disorder, unspecified: Secondary | ICD-10-CM | POA: Insufficient documentation

## 2019-01-22 DIAGNOSIS — E1122 Type 2 diabetes mellitus with diabetic chronic kidney disease: Secondary | ICD-10-CM | POA: Diagnosis not present

## 2019-01-22 DIAGNOSIS — Z9841 Cataract extraction status, right eye: Secondary | ICD-10-CM | POA: Diagnosis not present

## 2019-01-22 DIAGNOSIS — Z79899 Other long term (current) drug therapy: Secondary | ICD-10-CM | POA: Insufficient documentation

## 2019-01-22 DIAGNOSIS — G4710 Hypersomnia, unspecified: Secondary | ICD-10-CM | POA: Insufficient documentation

## 2019-01-22 DIAGNOSIS — I10 Essential (primary) hypertension: Secondary | ICD-10-CM | POA: Diagnosis not present

## 2019-01-22 DIAGNOSIS — Z9049 Acquired absence of other specified parts of digestive tract: Secondary | ICD-10-CM | POA: Diagnosis not present

## 2019-01-22 DIAGNOSIS — I129 Hypertensive chronic kidney disease with stage 1 through stage 4 chronic kidney disease, or unspecified chronic kidney disease: Secondary | ICD-10-CM | POA: Diagnosis not present

## 2019-01-22 DIAGNOSIS — N189 Chronic kidney disease, unspecified: Secondary | ICD-10-CM | POA: Diagnosis not present

## 2019-01-22 DIAGNOSIS — E785 Hyperlipidemia, unspecified: Secondary | ICD-10-CM | POA: Diagnosis not present

## 2019-01-22 DIAGNOSIS — Z90710 Acquired absence of both cervix and uterus: Secondary | ICD-10-CM | POA: Diagnosis not present

## 2019-01-22 DIAGNOSIS — C50911 Malignant neoplasm of unspecified site of right female breast: Secondary | ICD-10-CM | POA: Diagnosis present

## 2019-01-22 DIAGNOSIS — D0511 Intraductal carcinoma in situ of right breast: Secondary | ICD-10-CM

## 2019-01-22 DIAGNOSIS — Z803 Family history of malignant neoplasm of breast: Secondary | ICD-10-CM | POA: Insufficient documentation

## 2019-01-22 DIAGNOSIS — E11319 Type 2 diabetes mellitus with unspecified diabetic retinopathy without macular edema: Secondary | ICD-10-CM | POA: Diagnosis not present

## 2019-01-22 DIAGNOSIS — Z87442 Personal history of urinary calculi: Secondary | ICD-10-CM | POA: Diagnosis not present

## 2019-01-22 DIAGNOSIS — M1990 Unspecified osteoarthritis, unspecified site: Secondary | ICD-10-CM | POA: Insufficient documentation

## 2019-01-22 DIAGNOSIS — M545 Low back pain: Secondary | ICD-10-CM | POA: Insufficient documentation

## 2019-01-22 DIAGNOSIS — Z794 Long term (current) use of insulin: Secondary | ICD-10-CM | POA: Insufficient documentation

## 2019-01-22 DIAGNOSIS — Z7982 Long term (current) use of aspirin: Secondary | ICD-10-CM | POA: Insufficient documentation

## 2019-01-22 DIAGNOSIS — Z87891 Personal history of nicotine dependence: Secondary | ICD-10-CM | POA: Insufficient documentation

## 2019-01-22 DIAGNOSIS — Z801 Family history of malignant neoplasm of trachea, bronchus and lung: Secondary | ICD-10-CM | POA: Diagnosis not present

## 2019-01-22 HISTORY — PX: PARTIAL MASTECTOMY WITH NEEDLE LOCALIZATION: SHX6008

## 2019-01-22 HISTORY — PX: BREAST LUMPECTOMY: SHX2

## 2019-01-22 LAB — GLUCOSE, CAPILLARY
Glucose-Capillary: 114 mg/dL — ABNORMAL HIGH (ref 70–99)
Glucose-Capillary: 109 mg/dL — ABNORMAL HIGH (ref 70–99)

## 2019-01-22 SURGERY — PARTIAL MASTECTOMY WITH NEEDLE LOCALIZATION
Anesthesia: General | Site: Breast | Laterality: Right

## 2019-01-22 MED ORDER — FENTANYL CITRATE (PF) 100 MCG/2ML IJ SOLN
INTRAMUSCULAR | Status: AC
  Filled 2019-01-22: qty 2

## 2019-01-22 MED ORDER — GLYCOPYRROLATE 0.2 MG/ML IJ SOLN
INTRAMUSCULAR | Status: DC | PRN
  Administered 2019-01-22: 0.2 mg via INTRAVENOUS

## 2019-01-22 MED ORDER — CEFAZOLIN SODIUM-DEXTROSE 2-4 GM/100ML-% IV SOLN
2.0000 g | INTRAVENOUS | Status: AC
  Administered 2019-01-22: 2 g via INTRAVENOUS

## 2019-01-22 MED ORDER — FENTANYL CITRATE (PF) 100 MCG/2ML IJ SOLN
INTRAMUSCULAR | Status: DC | PRN
  Administered 2019-01-22 (×2): 50 ug via INTRAVENOUS

## 2019-01-22 MED ORDER — LIDOCAINE HCL (PF) 2 % IJ SOLN
INTRAMUSCULAR | Status: AC
  Filled 2019-01-22: qty 10

## 2019-01-22 MED ORDER — BUPIVACAINE LIPOSOME 1.3 % IJ SUSP
INTRAMUSCULAR | Status: AC
  Filled 2019-01-22: qty 20

## 2019-01-22 MED ORDER — SODIUM CHLORIDE 0.9 % IV SOLN
INTRAVENOUS | Status: DC
  Administered 2019-01-22 (×2): via INTRAVENOUS

## 2019-01-22 MED ORDER — CEFAZOLIN SODIUM-DEXTROSE 2-4 GM/100ML-% IV SOLN
INTRAVENOUS | Status: AC
  Filled 2019-01-22: qty 100

## 2019-01-22 MED ORDER — FENTANYL CITRATE (PF) 100 MCG/2ML IJ SOLN
25.0000 ug | INTRAMUSCULAR | Status: DC | PRN
  Administered 2019-01-22 (×2): 25 ug via INTRAVENOUS

## 2019-01-22 MED ORDER — OXYCODONE HCL 5 MG/5ML PO SOLN: 5.0000 mg | Freq: Once | ORAL | Status: DC | PRN

## 2019-01-22 MED ORDER — MIDAZOLAM HCL 2 MG/2ML IJ SOLN
INTRAMUSCULAR | Status: AC
  Filled 2019-01-22: qty 2

## 2019-01-22 MED ORDER — MIDAZOLAM HCL 2 MG/2ML IJ SOLN
INTRAMUSCULAR | Status: DC | PRN
  Administered 2019-01-22 (×2): 1 mg via INTRAVENOUS

## 2019-01-22 MED ORDER — SUGAMMADEX SODIUM 200 MG/2ML IV SOLN
INTRAVENOUS | Status: DC | PRN
  Administered 2019-01-22: 200 mg via INTRAVENOUS

## 2019-01-22 MED ORDER — ROCURONIUM BROMIDE 50 MG/5ML IV SOLN
INTRAVENOUS | Status: AC
  Filled 2019-01-22: qty 1

## 2019-01-22 MED ORDER — ROCURONIUM BROMIDE 100 MG/10ML IV SOLN
INTRAVENOUS | Status: DC | PRN
  Administered 2019-01-22: 30 mg via INTRAVENOUS
  Administered 2019-01-22: 10 mg via INTRAVENOUS

## 2019-01-22 MED ORDER — HYDROCODONE-ACETAMINOPHEN 5-325 MG PO TABS: 1.0000 | ORAL_TABLET | ORAL | 0 refills | Status: AC | PRN

## 2019-01-22 MED ORDER — OXYCODONE HCL 5 MG PO TABS: 5.0000 mg | ORAL_TABLET | Freq: Once | ORAL | Status: DC | PRN

## 2019-01-22 MED ORDER — EPHEDRINE SULFATE 50 MG/ML IJ SOLN
INTRAMUSCULAR | Status: DC | PRN
  Administered 2019-01-22: 5 mg via INTRAVENOUS

## 2019-01-22 MED ORDER — FENTANYL CITRATE (PF) 100 MCG/2ML IJ SOLN
INTRAMUSCULAR | Status: AC
  Administered 2019-01-22: 17:00:00 25 ug via INTRAVENOUS
  Filled 2019-01-22: qty 2

## 2019-01-22 MED ORDER — PROPOFOL 10 MG/ML IV BOLUS
INTRAVENOUS | Status: AC
  Filled 2019-01-22: qty 20

## 2019-01-22 MED ORDER — LACTATED RINGERS IV SOLN: INTRAVENOUS | Status: DC | PRN

## 2019-01-22 MED ORDER — DEXAMETHASONE SODIUM PHOSPHATE 10 MG/ML IJ SOLN
INTRAMUSCULAR | Status: AC
  Filled 2019-01-22: qty 1

## 2019-01-22 MED ORDER — PHENYLEPHRINE HCL (PRESSORS) 10 MG/ML IV SOLN
INTRAVENOUS | Status: DC | PRN
  Administered 2019-01-22: 100 ug via INTRAVENOUS
  Administered 2019-01-22: 150 ug via INTRAVENOUS
  Administered 2019-01-22: 100 ug via INTRAVENOUS

## 2019-01-22 MED ORDER — BUPIVACAINE-EPINEPHRINE 0.5% -1:200000 IJ SOLN
INTRAMUSCULAR | Status: DC | PRN
  Administered 2019-01-22: 30 mL

## 2019-01-22 MED ORDER — ONDANSETRON HCL 4 MG/2ML IJ SOLN
INTRAMUSCULAR | Status: AC
  Filled 2019-01-22: qty 2

## 2019-01-22 MED ORDER — LIDOCAINE HCL (CARDIAC) PF 100 MG/5ML IV SOSY
PREFILLED_SYRINGE | INTRAVENOUS | Status: DC | PRN
  Administered 2019-01-22: 80 mg via INTRAVENOUS

## 2019-01-22 MED ORDER — BUPIVACAINE-EPINEPHRINE (PF) 0.5% -1:200000 IJ SOLN
INTRAMUSCULAR | Status: AC
  Filled 2019-01-22: qty 30

## 2019-01-22 MED ORDER — ONDANSETRON HCL 4 MG/2ML IJ SOLN
INTRAMUSCULAR | Status: DC | PRN
  Administered 2019-01-22: 4 mg via INTRAVENOUS

## 2019-01-22 MED ORDER — SODIUM CHLORIDE FLUSH 0.9 % IV SOLN
INTRAVENOUS | Status: AC
  Filled 2019-01-22: qty 50

## 2019-01-22 MED ORDER — SUGAMMADEX SODIUM 200 MG/2ML IV SOLN
INTRAVENOUS | Status: AC
  Filled 2019-01-22: qty 2

## 2019-01-22 MED ORDER — SODIUM CHLORIDE 0.9 % IV SOLN
INTRAVENOUS | Status: DC | PRN
  Administered 2019-01-22: 15:00:00 30 ug/min via INTRAVENOUS

## 2019-01-22 MED ORDER — GLYCOPYRROLATE 0.2 MG/ML IJ SOLN
INTRAMUSCULAR | Status: AC
  Filled 2019-01-22: qty 1

## 2019-01-22 MED ORDER — EPHEDRINE SULFATE 50 MG/ML IJ SOLN
INTRAMUSCULAR | Status: AC
  Filled 2019-01-22: qty 1

## 2019-01-22 MED ORDER — PROPOFOL 10 MG/ML IV BOLUS
INTRAVENOUS | Status: DC | PRN
  Administered 2019-01-22: 120 mg via INTRAVENOUS

## 2019-01-22 MED ORDER — SUCCINYLCHOLINE CHLORIDE 20 MG/ML IJ SOLN
INTRAMUSCULAR | Status: DC | PRN
  Administered 2019-01-22: 120 mg via INTRAVENOUS

## 2019-01-22 MED ORDER — DEXAMETHASONE SODIUM PHOSPHATE 10 MG/ML IJ SOLN
INTRAMUSCULAR | Status: DC | PRN
  Administered 2019-01-22: 10 mg via INTRAVENOUS

## 2019-01-22 SURGICAL SUPPLY — 36 items
CANISTER SUCT 1200ML W/VALVE (MISCELLANEOUS) ×3 IMPLANT
CHLORAPREP W/TINT 26 (MISCELLANEOUS) ×3 IMPLANT
COVER WAND RF STERILE (DRAPES) ×3 IMPLANT
DERMABOND ADVANCED (GAUZE/BANDAGES/DRESSINGS) ×2
DERMABOND ADVANCED .7 DNX12 (GAUZE/BANDAGES/DRESSINGS) ×1 IMPLANT
DEVICE DUBIN SPECIMEN MAMMOGRA (MISCELLANEOUS) ×3 IMPLANT
DRAPE LAPAROTOMY 77X122 PED (DRAPES) ×3 IMPLANT
ELECT REM PT RETURN 9FT ADLT (ELECTROSURGICAL) ×3
ELECTRODE REM PT RTRN 9FT ADLT (ELECTROSURGICAL) ×1 IMPLANT
GLOVE BIO SURGEON STRL SZ 6.5 (GLOVE) ×2 IMPLANT
GLOVE BIO SURGEONS STRL SZ 6.5 (GLOVE) ×1
GLOVE BIOGEL PI IND STRL 6.5 (GLOVE) ×1 IMPLANT
GLOVE BIOGEL PI INDICATOR 6.5 (GLOVE) ×2
GLOVE SURG SYN 6.5 ES PF (GLOVE) ×3 IMPLANT
GLOVE SURG SYN 6.5 PF PI (GLOVE) IMPLANT
GOWN STRL REUS W/ TWL LRG LVL3 (GOWN DISPOSABLE) ×2 IMPLANT
GOWN STRL REUS W/TWL LRG LVL3 (GOWN DISPOSABLE) ×6
KIT TURNOVER KIT A (KITS) ×3 IMPLANT
LABEL OR SOLS (LABEL) ×3 IMPLANT
MARGIN MAP 10MM (MISCELLANEOUS) ×3 IMPLANT
NDL HYPO 25X1 1.5 SAFETY (NEEDLE) ×1 IMPLANT
NEEDLE HYPO 25X1 1.5 SAFETY (NEEDLE) ×3 IMPLANT
PACK BASIN MINOR ARMC (MISCELLANEOUS) ×3 IMPLANT
RETRACTOR RING XSMALL (MISCELLANEOUS) IMPLANT
RTRCTR WOUND ALEXIS 13CM XS SH (MISCELLANEOUS) ×3
SLEVE PROBE SENORX GAMMA FIND (MISCELLANEOUS) ×1 IMPLANT
SUT ETHILON 3-0 FS-10 30 BLK (SUTURE) ×3
SUT MNCRL 4-0 (SUTURE) ×2
SUT MNCRL 4-0 27XMFL (SUTURE) ×1
SUT SILK 2 0 SH (SUTURE) IMPLANT
SUT VIC AB 3-0 SH 27 (SUTURE) ×2
SUT VIC AB 3-0 SH 27X BRD (SUTURE) ×1 IMPLANT
SUTURE EHLN 3-0 FS-10 30 BLK (SUTURE) ×1 IMPLANT
SUTURE MNCRL 4-0 27XMF (SUTURE) ×1 IMPLANT
SYR 10ML LL (SYRINGE) ×3 IMPLANT
WATER STERILE IRR 1000ML POUR (IV SOLUTION) ×3 IMPLANT

## 2019-01-22 NOTE — Discharge Instructions (Signed)
  Diet: Resume home heart healthy regular diet.   Activity: No heavy lifting >20 pounds (children, pets, laundry, garbage) or strenuous activity until follow-up, but light activity and walking are encouraged. Do not drive or drink alcohol if taking narcotic pain medications.  Wound care: May shower with soapy water and pat dry (do not rub incisions), but no baths or submerging incision underwater until follow-up. (no swimming)   Medications: Resume all home medications. For mild to moderate pain: acetaminophen (Tylenol) or ibuprofen (if no kidney disease). Combining Tylenol with alcohol can substantially increase your risk of causing liver disease. Narcotic pain medications, if prescribed, can be used for severe pain, though may cause nausea, constipation, and drowsiness. Do not combine Tylenol and Norco within a 6 hour period as Norco contains Tylenol. If you do not need the narcotic pain medication, you do not need to fill the prescription.  Call office ([phone]) at any time if any questions, worsening pain, fevers/chills, bleeding, drainage from incision site, or other concerns.   AMBULATORY SURGERY  DISCHARGE INSTRUCTIONS   1) The drugs that you were given will stay in your system until tomorrow so for the next 24 hours you should not:  A) Drive an automobile B) Make any legal decisions C) Drink any alcoholic beverage   2) You may resume regular meals tomorrow.  Today it is better to start with liquids and gradually work up to solid foods.  You may eat anything you prefer, but it is better to start with liquids, then soup and crackers, and gradually work up to solid foods.   3) Please notify your doctor immediately if you have any unusual bleeding, trouble breathing, redness and pain at the surgery site, drainage, fever, or pain not relieved by medication.    4) Additional Instructions:        Please contact your physician with any problems or Same Day Surgery at  [phone], Monday through Friday 6 am to 4 pm, or [place] [facility] [place] [facility] number at [phone]. 

## 2019-01-22 NOTE — Anesthesia Postprocedure Evaluation (Signed)
Anesthesia Post Note  Patient: Kara [name]  Procedure(s) Performed: PARTIAL MASTECTOMY WITH NEEDLE LOCALIZATION (Right Breast)  Patient location during evaluation: PACU Anesthesia Type: General Level of consciousness: awake and alert Pain management: pain level controlled Vital Signs Assessment: post-procedure vital signs reviewed and stable Respiratory status: spontaneous breathing, nonlabored ventilation, respiratory function stable and patient connected to nasal cannula oxygen Cardiovascular status: blood pressure returned to baseline and stable Postop Assessment: no apparent nausea or vomiting Anesthetic complications: no     Last Vitals:  Vitals:   01/22/19 1701 01/22/19 1725  BP: (!) 129/41 (!) 116/46  Pulse: 78 66  Resp: 18 18  Temp: (!) 36.3 C   SpO2: 97% 100%    Last Pain:  Vitals:   01/22/19 1725  TempSrc:   PainSc: 0-No pain                 Ules Marsala S

## 2019-01-22 NOTE — Interval H&P Note (Signed)
Patient coming with right breast DCIS.  She was oriented about surgical management with partial mastectomy.  There has been no changes since her last physical exam.  All the questions were answered for the patient.  I reviewed the images, labs and the chart.  We will proceed with planned right breast partial mastectomy.

## 2019-01-22 NOTE — Transfer of Care (Signed)
Immediate Anesthesia Transfer of Care Note  Patient: Kara [name]  Procedure(s) Performed: PARTIAL MASTECTOMY WITH NEEDLE LOCALIZATION (Right Breast)  Patient Location: PACU  Anesthesia Type:General  Level of Consciousness: awake  Airway & Oxygen Therapy: Patient Spontanous Breathing and Patient connected to face mask oxygen  Post-op Assessment: Report given to RN and Post -op Vital signs reviewed and stable  Post vital signs: stable  Last Vitals:  Vitals Value Taken Time  BP 113/51 01/22/19 1612  Temp 36.6 C 01/22/19 1612  Pulse 94 01/22/19 1614  Resp 16 01/22/19 1614  SpO2 100 % 01/22/19 1614  Vitals shown include unvalidated device data.  Last Pain:  Vitals:   01/22/19 0929  TempSrc: Temporal  PainSc: 0-No pain         Complications: No apparent anesthesia complications

## 2019-01-22 NOTE — Anesthesia Post-op Follow-up Note (Signed)
Anesthesia QCDR form completed.        

## 2019-01-22 NOTE — Anesthesia Preprocedure Evaluation (Signed)
Anesthesia Evaluation  Patient identified by MRN, date of birth, ID band Patient awake    Reviewed: Allergy & Precautions, H&P , NPO status , Patient's Chart, lab work & pertinent test results  History of Anesthesia Complications Negative for: history of anesthetic complications  Airway Mallampati: III  TM Distance: <3 FB Neck ROM: limited    Dental  (+) Chipped   Pulmonary neg shortness of breath, sleep apnea , former smoker,           Cardiovascular Exercise Tolerance: Good hypertension, (-) angina(-) Past MI and (-) DOE      Neuro/Psych  Neuromuscular disease negative psych ROS   GI/Hepatic negative GI ROS, Neg liver ROS, neg GERD  ,  Endo/Other  diabetes, Type 2, Insulin Dependent  Renal/GU CRFRenal disease     Musculoskeletal   Abdominal   Peds  Hematology negative hematology ROS (+)   Anesthesia Other Findings Past Medical History: No date: Anemia No date: Arthropathy No date: Benign neoplasm of breast No date: Chest pain No date: Chronic kidney disease     Comment:  30% kidney function No date: Diabetes (HCC) No date: Diabetic retinopathy (HCC) No date: Dyspnea No date: Edema No date: History of kidney stones No date: Hyperlipemia No date: Hypersomnia with sleep apnea No date: Hypertension No date: 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HCC) No date: Lumbago No date: Osteoarthrosis No date: Ovarian failure No date: SOB (shortness of breath)  Past Surgical History: No date: BILATERAL SALPINGECTOMY 2014: BREAST BIOPSY; Right     Comment:  neg- core 12/21/2018: BREAST BIOPSY; Right     Comment:  [place] bx, venus clip,  DUCTAL CARCINOMA IN SITU 12/21/2018: BREAST BIOPSY; Right     Comment:  [place] bx, ribbon clip,  FIBROEPITHELIAL PROLIFERATION WITH               SCLEROSIS 01/01/2019: BREAST BIOPSY; Right     Comment:  Affirm bx "X" clip-path pending No date: [place] EXCISION 2014: BREAST  EXCISIONAL BIOPSY; Right     Comment:  neg 01/22/2019: BREAST LUMPECTOMY; Right     Comment:  path pending No date: CATARACT EXTRACTION No date: CHOLECYSTECTOMY 04/14/2015: CYSTOSCOPY WITH STENT PLACEMENT; Left     Comment:  Procedure: CYSTOSCOPY WITH STENT PLACEMENT;  Surgeon:               Hollice Espy, MD;  Location: [facility] OR[facility];  Service:               Urology;  Laterality: Left; 09/29/2014: EXTRACORPOREAL SHOCK WAVE LITHOTRIPSY; Left     Comment:  Procedure: EXTRACORPOREAL SHOCK WAVE LITHOTRIPSY (ESWL);              Surgeon: Hollice Espy, MD;  Location: [facility];                Service: Urology;  Laterality: Left; No date: EYE SURGERY     Comment:  bilateral cataract No date: LAPAROSCOPIC OOPHERECTOMY; Left No date: TONSILLECTOMY No date: TUBAL LIGATION 04/14/2015: URETEROSCOPY WITH HOLMIUM LASER LITHOTRIPSY; Left     Comment:  Procedure: URETEROSCOPY WITH HOLMIUM LASER LITHOTRIPSY;               Surgeon: Hollice Espy, MD;  Location: [facility] OR[facility];                Service: Urology;  Laterality: Left;  BMI    Body Mass Index: 39.92 kg/m      Reproductive/Obstetrics negative OB ROS  Anesthesia Physical Anesthesia Plan  ASA: III  Anesthesia Plan: General ETT   Post-op Pain Management:    Induction: Intravenous  PONV Risk Score and Plan: Ondansetron, Dexamethasone, Midazolam and Treatment may vary due to age or medical condition  Airway Management Planned: Oral ETT and Video Laryngoscope Planned  Additional Equipment:   Intra-op Plan:   Post-operative Plan: Extubation in OR  Informed Consent: I have reviewed the patients History and Physical, chart, labs and discussed the procedure including the risks, benefits and alternatives for the proposed anesthesia with the patient or authorized representative who has indicated his/her understanding and acceptance.     Dental Advisory Given  Plan Discussed with:  Anesthesiologist, CRNA and Surgeon  Anesthesia Plan Comments: (Patient consented for risks of anesthesia including but not limited to:  - adverse reactions to medications - damage to teeth, lips or other oral mucosa - sore throat or hoarseness - Damage to heart, brain, lungs or loss of life  Patient voiced understanding.)        Anesthesia Quick Evaluation

## 2019-01-22 NOTE — Op Note (Signed)
Preoperative diagnosis: Right breast carcinoma.  Postoperative diagnosis: Right breast carcinoma.   Procedure: Right needle-localized partial mastectomy.                       Anesthesia: GETA  Surgeon: Dr. Windell Moment  Wound Classification: Clean  Indications: Patient is a 73 y.o. female with a nonpalpable right breast mass noted on mammography with core biopsy demonstrating DCIS requires needle-localized partial mastectomy for treatment.   Findings: 1. Specimen mammography shows marker and wire on specimen 2. Pathology call refers gross examination of margins was no mass appreciated.  3. No other palpable mass or lymph node identified.   Description of procedure: Preoperative needle localization was performed by radiology. Localization studies were reviewed. The patient was taken to the operating room and placed supine on the operating table, and after general anesthesia the right chest was prepped and draped in the usual sterile fashion. A time-out was completed verifying correct patient, procedure, site, positioning, and implant(s) and/or special equipment prior to beginning this procedure.  By comparing the localization studies with the direction and skin entry site of the needle, the probable trajectory and location of the mass was visualized. A circumareolar skin incision was planned in such a way as to minimize the amount of dissection to reach the mass.  The skin incision was made. Flaps were raised and the location of the wire confirmed. The wire was delivered into the wound. A 2-0 silk figure-of-eight stay suture was placed around the wire and used for retraction. Dissection was then taken down circumferentially, taking care to include the entire localizing needle and a wide margin of grossly normal tissue. The specimen and entire localizing wire were removed. The specimen was oriented and sent to radiology with the localization studies. Confirmation was received that the entire  target lesion had been resected. The wound was irrigated. Hemostasis was checked. The wound was closed with interrupted sutures of 3-0 Vicryl and a subcuticular suture of Monocryl 3-0. No attempt was made to close the dead space. A dressing was applied.   Specimen: Right Breast mass (Orientation markers used: Cranial, Lateral, Deep)              Complications: None  Estimated Blood Loss: 10 mL

## 2019-01-22 NOTE — Anesthesia Procedure Notes (Addendum)
Procedure Name: Intubation Date/Time: 01/22/2019 2:35 PM Performed by: Lavone Orn, CRNA Pre-anesthesia Checklist: Patient identified, Emergency Drugs available, Suction available, Patient being monitored and Timeout performed Patient Re-evaluated:Patient Re-evaluated prior to induction Oxygen Delivery Method: Circle system utilized Preoxygenation: Pre-oxygenation with 100% oxygen Induction Type: IV induction Ventilation: Mask ventilation without difficulty Laryngoscope Size: McGraph and 3 Grade View: Grade II Tube type: Oral Tube size: 7.0 mm Number of attempts: 1 Airway Equipment and Method: Stylet and Video-laryngoscopy Placement Confirmation: ETT inserted through vocal cords under direct vision,  positive ETCO2 and breath sounds checked- equal and bilateral Secured at: 22 cm Tube secured with: Tape Dental Injury: Teeth and Oropharynx as per pre-operative assessment

## 2019-01-23 ENCOUNTER — Encounter: Payer: Self-pay | Admitting: General Surgery

## 2019-01-26 DIAGNOSIS — H34832 Tributary (branch) retinal vein occlusion, left eye, with macular edema: Secondary | ICD-10-CM | POA: Diagnosis not present

## 2019-01-26 LAB — SURGICAL PATHOLOGY

## 2019-01-27 ENCOUNTER — Other Ambulatory Visit: Payer: Self-pay

## 2019-02-01 NOTE — Progress Notes (Signed)
[place]  [phone] [phone]) [phone]  ID: Kara [name] OB: April 08, 1946  MR#: [id number]  XTK#:[id number]  Patient Care Team: Lavera Guise, MD as PCP - General (Internal Medicine)  CHIEF COMPLAINT: [name]-grade DCIS of the right breast.  INTERVAL HISTORY: Patient returns to clinic today after her lumpectomy to discuss her final pathology results and treatment planning.  She continues to be highly anxious.  She tolerated her surgery well without significant side effects. She has no neurologic complaints.  She denies any recent fevers or illnesses.  She has a good appetite and denies weight loss.  She has no chest pain, shortness of breath, cough, or hemoptysis.  She denies any nausea, vomiting, constipation, or diarrhea.  She has no urinary complaints.  Patient offers no further specific complaints today.  REVIEW OF SYSTEMS:   Review of Systems  Constitutional: Negative.  Negative for fever, malaise/fatigue and weight loss.  Respiratory: Negative.  Negative for cough, hemoptysis and shortness of breath.   Cardiovascular: Negative.  Negative for chest pain and leg swelling.  Gastrointestinal: Negative.  Negative for abdominal pain.  Genitourinary: Negative.  Negative for dysuria.  Musculoskeletal: Negative.  Negative for back pain.  Skin: Negative.  Negative for rash.  Neurological: Negative.  Negative for dizziness, focal weakness, weakness and headaches.  Psychiatric/Behavioral: The patient is nervous/anxious.     As per HPI. Otherwise, a complete review of systems is negative.  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[facility])    Lumbago    Osteoarthrosis    Ovarian failure    SOB (shortness of breath)     PAST SURGICAL HISTORY: Past Surgical History:  Procedure Laterality Date   BILATERAL SALPINGECTOMY     BREAST BIOPSY Right 2014   neg- core   BREAST BIOPSY Right 12/21/2018   [place] bx, venus clip,  DUCTAL CARCINOMA IN SITU   BREAST BIOPSY Right 12/21/2018   [place] bx, ribbon clip,  FIBROEPITHELIAL PROLIFERATION WITH SCLEROSIS   BREAST BIOPSY Right 01/01/2019   Affirm bx "X" clip-path pending   BREAST DUCTAL SYSTEM EXCISION     BREAST EXCISIONAL BIOPSY Right 2014   neg   BREAST LUMPECTOMY Right 01/22/2019   path pending   CATARACT EXTRACTION     CHOLECYSTECTOMY     CYSTOSCOPY WITH STENT PLACEMENT Left 04/14/2015   Procedure: CYSTOSCOPY WITH STENT PLACEMENT;  Surgeon: Hollice Espy, MD;  Location: [facility];  Service: Urology;  Laterality: Left;   EXTRACORPOREAL SHOCK WAVE LITHOTRIPSY Left 09/29/2014   Procedure: EXTRACORPOREAL SHOCK WAVE LITHOTRIPSY (ESWL);  Surgeon: Hollice Espy, MD;  Location: [facility];  Service: Urology;  Laterality: Left;   EYE SURGERY     bilateral cataract   LAPAROSCOPIC OOPHERECTOMY Left    PARTIAL MASTECTOMY WITH NEEDLE LOCALIZATION Right 01/22/2019   Procedure: PARTIAL MASTECTOMY WITH NEEDLE LOCALIZATION;  Surgeon: Herbert Pun, MD;  Location: [facility];  Service: General;  Laterality: Right;   TONSILLECTOMY     TUBAL LIGATION     URETEROSCOPY WITH HOLMIUM LASER LITHOTRIPSY Left  04/14/2015   Procedure: URETEROSCOPY WITH HOLMIUM LASER LITHOTRIPSY;  Surgeon: Hollice Espy, MD;  Location: [facility];  Service: Urology;  Laterality: Left;    FAMILY HISTORY: Family History  Problem Relation Age of Onset   Breast cancer Maternal Grandmother 36   Colon cancer Mother    Lung cancer Father     ADVANCED DIRECTIVES (Y/N):  N  HEALTH MAINTENANCE: Social History   Tobacco Use   Smoking status: Former Smoker     Packs/day: 1.00    Years: 30.00    Pack years: 30.00    Quit date: 04/11/1995    Years since quitting: 23.8   Smokeless tobacco: Never Used  Substance Use Topics   Alcohol use: No    Alcohol/week: 0.0 standard drinks   Drug use: No     Colonoscopy:  PAP:  Bone density:  Lipid panel:  No Known Allergies  Current Outpatient Medications  Medication Sig Dispense Refill   Aflibercept (EYLEA) 2 MG/0.05ML SOLN      APPLE CIDER VINEGAR PO Take 1 tablet by mouth daily.     aspirin EC 81 MG tablet Take 81 mg by mouth daily.      cetirizine (ZYRTEC) 10 MG tablet Take 10 mg by mouth daily as needed for allergies.      cholecalciferol (VITAMIN D) 1000 UNITS tablet Take 1,000 Units by mouth daily.      Ferrous Gluconate 324 (37.5 Fe) MG TABS Take 324 mg by mouth 2 (two) times daily.      furosemide (LASIX) 20 MG tablet Take 20 mg by mouth 2 (two) times daily as needed.  5   glimepiride (AMARYL) 4 MG tablet Take 1 tablet (4 mg total) by mouth 2 (two) times daily with a meal. 180 tablet 1   Glucos-Chond-Hyal Ac-Ca Fructo (MOVE FREE JOINT HEALTH ADVANCE) TABS Take 1 tablet by mouth daily.     Insulin Detemir (LEVEMIR FLEXTOUCH) 100 UNIT/ML Pen Inject 75 Units into the skin daily after supper. 21 mL 3   lisinopril (PRINIVIL,ZESTRIL) 20 MG tablet Take 20 mg by mouth daily.     loperamide (IMODIUM A-D) 2 MG tablet Take 2 mg by mouth as needed for diarrhea or loose stools.     MISC NATURAL PRODUCTS ER PO Take 1 capsule by mouth 2 (two) times daily. Blood sugar support     Multiple Vitamin (MULTIVITAMIN) tablet Take 1 tablet by mouth daily at 12 noon.     pantoprazole (PROTONIX) 40 MG tablet TAKE 1 TABLET(S) BY MOUTH DAILY 90 tablet 1   pregabalin (LYRICA) 100 MG capsule Take 1 capsule (100 mg total) by mouth 3 (three) times daily. (Patient taking differently: Take 100 mg by mouth 2 (two) times daily. ) 90 capsule 3   rosuvastatin (CRESTOR) 20 MG tablet TAKE 1 TABLET BY MOUTH  EVERYDAY AT BEDTIME (Patient taking differently: Take 20 mg by mouth at bedtime. ) 90 tablet 3   traMADol-acetaminophen (ULTRACET) 37.5-325 MG tablet Take 1 tablet by mouth 2 (two) times daily as needed for moderate pain. 60 tablet 2   TRULICITY 1.5 IN/8.6VE SOPN Inject 1.48m [place] once weekly (Patient taking differently: Inject 1.5 mg into the skin every 7 (seven) days. ) 4 pen 5   verapamil (CALAN) 40 MG tablet TAKE 1 TABLET(S) BY MOUTH TWICE A DAY FOR HYPERTENSION (Patient taking differently: Take 40 mg by mouth 2 (two) times daily. ) 60 tablet 4   No current facility-administered medications for this visit.     OBJECTIVE:  Vitals:   02/04/19 0932  BP: (!) 158/72  Pulse: 64  Temp: (!) 96.8 F (36 C)     Body mass index is 40.06 kg/m.    ECOG FS:0 - Asymptomatic  General: Well-developed, well-nourished, no acute distress. Eyes: Pink conjunctiva, anicteric sclera. HEENT: Normocephalic, moist mucous membranes. Breast: Well-healing surgical scar on right breast. Lungs: Clear to auscultation bilaterally. Heart: Regular rate and rhythm. No rubs, murmurs, or gallops. Abdomen: Soft, nontender, nondistended. No organomegaly noted, normoactive bowel sounds. Musculoskeletal: No edema, cyanosis, or clubbing. Neuro: Alert, answering all questions appropriately. Cranial nerves grossly intact. Skin: No rashes or petechiae noted. Psych: Normal affect.  LAB RESULTS:  Lab Results  Component Value Date   NA 141 03/08/2013   K 4.1 03/08/2013   CL 106 03/08/2013   CO2 30 03/08/2013   GLUCOSE 170 (H) 03/08/2013   BUN 26 (H) 03/08/2013   CREATININE 1.47 (H) 03/08/2013   CALCIUM 9.7 03/08/2013   PROT 7.7 07/23/2011   ALBUMIN 3.5 07/23/2011   AST 16 07/23/2011   ALT 24 07/23/2011   ALKPHOS 85 07/23/2011   BILITOT 0.4 07/23/2011   GFRNONAA 37 (L) 03/08/2013   GFRAA 43 (L) 03/08/2013    Lab Results  Component Value Date   WBC 5.7 03/08/2013   NEUTROABS 3.4 07/23/2011   HGB 11.6 (L)  03/08/2013   HCT 34.8 (L) 03/08/2013   MCV 87 03/08/2013   PLT 145 (L) 03/08/2013     STUDIES: Mm Breast Surgical Specimen  Result Date: 01/22/2019 CLINICAL DATA:  Post right breast lumpectomy. EXAM: SPECIMEN RADIOGRAPH OF THE RIGHT BREAST COMPARISON:  Previous exam(s). FINDINGS: Status post excision of the right breast. The wire tip and adjacent Venus type biopsy marker clip are present and are marked for pathology. In addition, both the ribbon and X shaped biopsy marking clips at site of prior benign biopsies are included in the specimen. This was discussed with Dr. Windell Moment 3:27 p.m. 01/22/2019. IMPRESSION: Specimen radiograph of the right breast. Electronically Signed   By: Everlean Alstrom M.D.   On: 01/22/2019 15:27   Mm Rt Plc Breast Loc Dev   1st Lesion  Inc Mammo Guide  Result Date: 01/22/2019 CLINICAL DATA:  73 year old female with recently diagnosed right breast ductal carcinoma in situ, [name] grade involving an intraductal papilloma with sclerosis at the 9:30 position (at site of Venus shaped biopsy marking clip) presents for preoperative wire localization. Two additional biopsies in the right breast at the sites a ribbon shaped and X shaped biopsy marking clip were benign. EXAM: NEEDLE LOCALIZATION OF THE RIGHT BREAST WITH MAMMO GUIDANCE COMPARISON:  Previous exams. FINDINGS: Patient presents for needle localization prior to right breast lumpectomy. I met with the patient and we discussed the procedure of needle localization including benefits and alternatives. We discussed the high likelihood of a successful procedure. We discussed the risks of the procedure, including infection, bleeding, tissue injury, and further surgery. Informed, written consent was given. The usual time-out protocol was performed immediately prior to the procedure. Using mammographic guidance, sterile technique, 1% lidocaine and a 7 cm modified Kopans needle, the Venus shaped biopsy marking clip at the 9:30  position was localized using lateral to medial approach. The images were marked for Dr. Windell Moment. IMPRESSION: Needle localization right breast breast. No apparent complications. Electronically Signed   By: Everlean Alstrom M.D.   On: 01/22/2019 09:22    ASSESSMENT: [name]-grade DCIS of the right breast.  PLAN:    1. [name]-grade DCIS of the  right breast: Patient declined enrollment in COMET trial.  She underwent lumpectomy on January 22, 2019.  Final pathology noted close, but clear margins.  I recommended adjuvant XRT and patient has an appointment with radiation oncology later today.  Upon completion of her XRT, she will also require tamoxifen for total of 5 years.  Return to clinic the first week of December near the end of her XRT for further evaluation and initiation of tamoxifen.  I spent a total of 30 minutes face-to-face with the patient of which greater than 50% of the visit was spent in counseling and coordination of care as detailed above.   Patient expressed understanding and was in agreement with this plan. She also understands that She can call clinic at any time with any questions, concerns, or complaints.   Cancer Staging Ductal carcinoma in situ (DCIS) of right breast Staging form: Breast, AJCC 8th Edition - Clinical stage from 01/01/2019: Stage 0 (cTis (DCIS), cN0, cM0, G1, ER+, PR: Not Assessed, HER2: Not Assessed) - Signed by Lloyd Huger, MD on 01/01/2019   Lloyd Huger, MD   02/06/2019 7:01 AM

## 2019-02-03 ENCOUNTER — Telehealth: Payer: Self-pay

## 2019-02-03 ENCOUNTER — Other Ambulatory Visit: Payer: Self-pay

## 2019-02-03 NOTE — Telephone Encounter (Signed)
Pre-visit call attempted prior to [place] appointment with Dr. Grayland Ormond on 02/04/2019. No answer / left a msg / no need for callback.

## 2019-02-04 ENCOUNTER — Encounter: Payer: Self-pay | Admitting: Radiation Oncology

## 2019-02-04 ENCOUNTER — Encounter: Payer: Self-pay | Admitting: Oncology

## 2019-02-04 ENCOUNTER — Ambulatory Visit
Admission: RE | Admit: 2019-02-04 | Discharge: 2019-02-04 | Disposition: A | Discharge status: Home / Self Care | Payer: Medicare HMO | Source: Ambulatory Visit | Attending: Radiation Oncology | Admitting: Radiation Oncology

## 2019-02-04 ENCOUNTER — Inpatient Hospital Stay: Payer: Medicare HMO | Attending: Oncology | Admitting: Oncology

## 2019-02-04 ENCOUNTER — Other Ambulatory Visit: Payer: Self-pay

## 2019-02-04 VITALS — BP 158/72 | HR 64 | Temp 96.8°F | Wt 219.0 lb

## 2019-02-04 VITALS — BP 158/72 | HR 64 | Temp 96.8°F | Resp 18 | Wt 219.6 lb

## 2019-02-04 DIAGNOSIS — Z87442 Personal history of urinary calculi: Secondary | ICD-10-CM | POA: Insufficient documentation

## 2019-02-04 DIAGNOSIS — I129 Hypertensive chronic kidney disease with stage 1 through stage 4 chronic kidney disease, or unspecified chronic kidney disease: Secondary | ICD-10-CM | POA: Diagnosis not present

## 2019-02-04 DIAGNOSIS — D649 Anemia, unspecified: Secondary | ICD-10-CM | POA: Insufficient documentation

## 2019-02-04 DIAGNOSIS — G4730 Sleep apnea, unspecified: Secondary | ICD-10-CM | POA: Insufficient documentation

## 2019-02-04 DIAGNOSIS — D0511 Intraductal carcinoma in situ of right breast: Secondary | ICD-10-CM | POA: Insufficient documentation

## 2019-02-04 DIAGNOSIS — Z803 Family history of malignant neoplasm of breast: Secondary | ICD-10-CM | POA: Diagnosis not present

## 2019-02-04 DIAGNOSIS — Z170 Estrogen receptor positive status [ER+]: Secondary | ICD-10-CM | POA: Diagnosis not present

## 2019-02-04 DIAGNOSIS — Z79899 Other long term (current) drug therapy: Secondary | ICD-10-CM | POA: Insufficient documentation

## 2019-02-04 DIAGNOSIS — Z9079 Acquired absence of other genital organ(s): Secondary | ICD-10-CM | POA: Diagnosis not present

## 2019-02-04 DIAGNOSIS — I10 Essential (primary) hypertension: Secondary | ICD-10-CM | POA: Insufficient documentation

## 2019-02-04 DIAGNOSIS — N189 Chronic kidney disease, unspecified: Secondary | ICD-10-CM | POA: Diagnosis not present

## 2019-02-04 DIAGNOSIS — Z794 Long term (current) use of insulin: Secondary | ICD-10-CM | POA: Diagnosis not present

## 2019-02-04 DIAGNOSIS — Z7982 Long term (current) use of aspirin: Secondary | ICD-10-CM | POA: Diagnosis not present

## 2019-02-04 DIAGNOSIS — Z800 Family history of malignant neoplasm of digestive organs: Secondary | ICD-10-CM | POA: Insufficient documentation

## 2019-02-04 DIAGNOSIS — E785 Hyperlipidemia, unspecified: Secondary | ICD-10-CM | POA: Diagnosis not present

## 2019-02-04 DIAGNOSIS — E1142 Type 2 diabetes mellitus with diabetic polyneuropathy: Secondary | ICD-10-CM | POA: Insufficient documentation

## 2019-02-04 DIAGNOSIS — E11319 Type 2 diabetes mellitus with unspecified diabetic retinopathy without macular edema: Secondary | ICD-10-CM | POA: Diagnosis not present

## 2019-02-04 DIAGNOSIS — Z801 Family history of malignant neoplasm of trachea, bronchus and lung: Secondary | ICD-10-CM | POA: Insufficient documentation

## 2019-02-04 DIAGNOSIS — Z87891 Personal history of nicotine dependence: Secondary | ICD-10-CM | POA: Diagnosis not present

## 2019-02-04 NOTE — Consult Note (Signed)
NEW PATIENT EVALUATION  Name: Kara [name]  MRN: [id number]  Date:   02/04/2019     DOB: 1945/09/22   This 73 y.o. female patient presents to the clinic for initial evaluation of ductal carcinoma in situ of the right breast status post wide local excision stage 0 (Tis N0 M0) ER/PR positive.  REFERRING PHYSICIAN: Lavera Guise, MD  CHIEF COMPLAINT:  Chief Complaint  Patient presents with  . Breast Cancer    Initial Eval    DIAGNOSIS: The encounter diagnosis was Ductal carcinoma in situ (DCIS) of right breast.   PREVIOUS INVESTIGATIONS:  Mammograms and ultrasound reviewed Pathology report reviewed Clinical notes reviewed  HPI: Patient is a 73 year old female who presented with an abnormal mammogram of her right breast showing abnormality in the right breast.  This was at the 9:30 position 9 cm from the nipple also confirmed on ultrasound to be hypoechoic.  Patient underwent targeted ultrasound biopsy showing ductal carcinoma in situ low-grade cribriform pattern involving an intraductal papilloma with sclerosis.  She underwent a wide local excision showing 4 cm of low-grade 1 ductal carcinoma in situ no necrosis identified.  Margin was clear but close at 1 mm.  No lymph nodes were submitted.  Tumor again was ER/PR positive.  The tumor showed extensive ductal carcinoma in situ she has done well postoperatively.  She specifically denies breast tenderness cough or bone pain.  She has seen medical oncology and will revisit him and surgery in the next week.  She is against having radiation therapy at this time.  PLANNED TREATMENT REGIMEN: Right whole breast radiation  PAST MEDICAL HISTORY:  has a past medical history of Anemia, Arthropathy, Benign neoplasm of breast, Chest pain, Chronic kidney disease, Diabetes ([place]), Diabetic retinopathy ([place]), Dyspnea, Edema, History of kidney stones, Hyperlipemia, Hypersomnia with sleep apnea, Hypertension, 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[place]), Lumbago,  Osteoarthrosis, Ovarian failure, and SOB (shortness of breath).    PAST SURGICAL HISTORY:  Past Surgical History:  Procedure Laterality Date  . BILATERAL SALPINGECTOMY    . BREAST BIOPSY Right 2014   neg- core  . BREAST BIOPSY Right 12/21/2018   [place] bx, venus clip,  DUCTAL CARCINOMA IN SITU  . BREAST BIOPSY Right 12/21/2018   [place] bx, ribbon clip,  FIBROEPITHELIAL PROLIFERATION WITH SCLEROSIS  . BREAST BIOPSY Right 01/01/2019   Affirm bx "X" clip-path pending  . BREAST DUCTAL SYSTEM EXCISION    . BREAST EXCISIONAL BIOPSY Right 2014   neg  . BREAST LUMPECTOMY Right 01/22/2019   path pending  . CATARACT EXTRACTION    . CHOLECYSTECTOMY    . CYSTOSCOPY WITH STENT PLACEMENT Left 04/14/2015   Procedure: CYSTOSCOPY WITH STENT PLACEMENT;  Surgeon: Hollice Espy, MD;  Location: [facility];  Service: Urology;  Laterality: Left;  . EXTRACORPOREAL SHOCK WAVE LITHOTRIPSY Left 09/29/2014   Procedure: EXTRACORPOREAL SHOCK WAVE LITHOTRIPSY (ESWL);  Surgeon: Hollice Espy, MD;  Location: [facility];  Service: Urology;  Laterality: Left;  . EYE SURGERY     bilateral cataract  . LAPAROSCOPIC OOPHERECTOMY Left   . PARTIAL MASTECTOMY WITH NEEDLE LOCALIZATION Right 01/22/2019   Procedure: PARTIAL MASTECTOMY WITH NEEDLE LOCALIZATION;  Surgeon: Herbert Pun, MD;  Location: [facility];  Service: General;  Laterality: Right;  . TONSILLECTOMY    . TUBAL LIGATION    . URETEROSCOPY WITH HOLMIUM LASER LITHOTRIPSY Left 04/14/2015   Procedure: URETEROSCOPY WITH HOLMIUM LASER LITHOTRIPSY;  Surgeon: Hollice Espy, MD;  Location: [facility];  Service: Urology;  Laterality: Left;  FAMILY HISTORY: family history includes Breast cancer (age of onset: 44) in her maternal grandmother; Colon cancer in her mother; Lung cancer in her father.  SOCIAL HISTORY:  reports that she quit smoking about 23 years ago. She has a 30.00 pack-year smoking history. She has never used smokeless tobacco. She reports that she does not  drink alcohol or use drugs.  ALLERGIES: Patient has no known allergies.  MEDICATIONS:  Current Outpatient Medications  Medication Sig Dispense Refill  . Aflibercept (EYLEA) 2 MG/0.05ML SOLN     . APPLE CIDER VINEGAR PO Take 1 tablet by mouth daily.    Marland Kitchen aspirin EC 81 MG tablet Take 81 mg by mouth daily.     . cetirizine (ZYRTEC) 10 MG tablet Take 10 mg by mouth daily as needed for allergies.     . cholecalciferol (VITAMIN D) 1000 UNITS tablet Take 1,000 Units by mouth daily.     . Ferrous Gluconate 324 (37.5 Fe) MG TABS Take 324 mg by mouth 2 (two) times daily.     . furosemide (LASIX) 20 MG tablet Take 20 mg by mouth 2 (two) times daily as needed.  5  . glimepiride (AMARYL) 4 MG tablet Take 1 tablet (4 mg total) by mouth 2 (two) times daily with a meal. 180 tablet 1  . Glucos-Chond-Hyal Ac-Ca Fructo (MOVE FREE JOINT HEALTH ADVANCE) TABS Take 1 tablet by mouth daily.    . Insulin Detemir (LEVEMIR FLEXTOUCH) 100 UNIT/ML Pen Inject 75 Units into the skin daily after supper. 21 mL 3  . lisinopril (PRINIVIL,ZESTRIL) 20 MG tablet Take 20 mg by mouth daily.    Marland Kitchen loperamide (IMODIUM A-D) 2 MG tablet Take 2 mg by mouth as needed for diarrhea or loose stools.    Marland Kitchen MISC NATURAL PRODUCTS ER PO Take 1 capsule by mouth 2 (two) times daily. Blood sugar support    . Multiple Vitamin (MULTIVITAMIN) tablet Take 1 tablet by mouth daily at 12 noon.    . pantoprazole (PROTONIX) 40 MG tablet TAKE 1 TABLET(S) BY MOUTH DAILY 90 tablet 1  . pregabalin (LYRICA) 100 MG capsule Take 1 capsule (100 mg total) by mouth 3 (three) times daily. (Patient taking differently: Take 100 mg by mouth 2 (two) times daily. ) 90 capsule 3  . rosuvastatin (CRESTOR) 20 MG tablet TAKE 1 TABLET BY MOUTH EVERYDAY AT BEDTIME (Patient taking differently: Take 20 mg by mouth at bedtime. ) 90 tablet 3  . traMADol-acetaminophen (ULTRACET) 37.5-325 MG tablet Take 1 tablet by mouth 2 (two) times daily as needed for moderate pain. 60 tablet 2  .  TRULICITY 1.5 IR/5.1OA SOPN Inject 1.5mg  [place] once weekly (Patient taking differently: Inject 1.5 mg into the skin every 7 (seven) days. ) 4 pen 5  . verapamil (CALAN) 40 MG tablet TAKE 1 TABLET(S) BY MOUTH TWICE A DAY FOR HYPERTENSION (Patient taking differently: Take 40 mg by mouth 2 (two) times daily. ) 60 tablet 4   No current facility-administered medications for this encounter.     ECOG PERFORMANCE STATUS:  0 - Asymptomatic  REVIEW OF SYSTEMS: Patient denies any weight loss, fatigue, weakness, fever, chills or night sweats. Patient denies any loss of vision, blurred vision. Patient denies any ringing  of the ears or hearing loss. No irregular heartbeat. Patient denies heart murmur or history of fainting. Patient denies any chest pain or pain radiating to her upper extremities. Patient denies any shortness of breath, difficulty breathing at night, cough or hemoptysis. Patient denies any swelling  in the lower legs. Patient denies any nausea vomiting, vomiting of blood, or coffee ground material in the vomitus. Patient denies any stomach pain. Patient states has had normal bowel movements no significant constipation or diarrhea. Patient denies any dysuria, hematuria or significant nocturia. Patient denies any problems walking, swelling in the joints or loss of balance. Patient denies any skin changes, loss of hair or loss of weight. Patient denies any excessive worrying or anxiety or significant depression. Patient denies any problems with insomnia. Patient denies excessive thirst, polyuria, polydipsia. Patient denies any swollen glands, patient denies easy bruising or easy bleeding. Patient denies any recent infections, allergies or URI. Patient "s visual fields have not changed significantly in recent time.   PHYSICAL EXAM: BP (!) 158/72   Pulse 64   Temp (!) 96.8 F (36 C)   Resp 18   Wt 219 lb 9.6 oz (99.6 kg)   BMI 40.17 kg/m  Right breast is wide local excision located in the lateral  aspect of the right breast in the upper outer quadrant.  Incision is well-healed no dominant mass or nodularity is noted in either breast in 2 positions examined.  No axillary or supraclavicular adenopathy is identified.  Well-developed well-nourished patient in NAD. HEENT reveals PERLA, EOMI, discs not visualized.  Oral cavity is clear. No oral mucosal lesions are identified. Neck is clear without evidence of cervical or supraclavicular adenopathy. Lungs are clear to A&P. Cardiac examination is essentially unremarkable with regular rate and rhythm without murmur rub or thrill. Abdomen is benign with no organomegaly or masses noted. Motor sensory and DTR levels are equal and symmetric in the upper and lower extremities. Cranial nerves II through XII are grossly intact. Proprioception is intact. No peripheral adenopathy or edema is identified. No motor or sensory levels are noted. Crude visual fields are within normal range.  LABORATORY DATA: Pathology report reviewed    RADIOLOGY RESULTS: Mammograms and ultrasound reviewed compatible with above-stated findings   IMPRESSION: Stage 0 (Tis N0 M0) ER/PR positive ductal carcinoma in situ of the right breast status post wide local excision with close margin in 73 year old female  PLAN: At this time of had a discussion with the patient and my recommendations for whole breast radiation.  Based on the extensive involvement of ductal carcinoma in situ of the specimen as well as the close margin less than 2 mm and large size of the aggregate dimension of this lesion I would favor whole breast radiation.  I do not think she needs reexcision as I would boost the scar another 1600 cGy using electron beam to help prevent local regional recurrence.  Risks and benefits of radiation including skin reaction fatigue alteration of blood counts possible inclusion of superficial lung wall were described in detail to the patient.  I would like her to further discuss her  management with medical oncology as well as Dr. Peyton Najjar and I have set up a simulation time after her appointment next week with the surgeon.  Patient also will be a candidate for antiestrogen therapy after completion of radiation.  Patient is to call with any concerns at any time.  She also will cancel her simulation appointment if she decides that she still does not want to go ahead with radiation.  I would like to take this opportunity to thank you for allowing me to participate in the care of your patient.Noreene Filbert, MD

## 2019-02-10 ENCOUNTER — Other Ambulatory Visit: Payer: Self-pay

## 2019-02-11 ENCOUNTER — Ambulatory Visit
Admission: RE | Admit: 2019-02-11 | Discharge: 2019-02-11 | Disposition: A | Discharge status: Home / Self Care | Payer: Medicare HMO | Source: Ambulatory Visit | Attending: Radiation Oncology | Admitting: Radiation Oncology

## 2019-02-11 ENCOUNTER — Other Ambulatory Visit: Payer: Self-pay

## 2019-02-11 DIAGNOSIS — D0511 Intraductal carcinoma in situ of right breast: Secondary | ICD-10-CM | POA: Insufficient documentation

## 2019-02-11 DIAGNOSIS — Z801 Family history of malignant neoplasm of trachea, bronchus and lung: Secondary | ICD-10-CM | POA: Insufficient documentation

## 2019-02-11 DIAGNOSIS — E785 Hyperlipidemia, unspecified: Secondary | ICD-10-CM | POA: Insufficient documentation

## 2019-02-11 DIAGNOSIS — Z803 Family history of malignant neoplasm of breast: Secondary | ICD-10-CM | POA: Diagnosis not present

## 2019-02-11 DIAGNOSIS — Z79899 Other long term (current) drug therapy: Secondary | ICD-10-CM | POA: Diagnosis not present

## 2019-02-11 DIAGNOSIS — E119 Type 2 diabetes mellitus without complications: Secondary | ICD-10-CM | POA: Insufficient documentation

## 2019-02-11 DIAGNOSIS — Z87891 Personal history of nicotine dependence: Secondary | ICD-10-CM | POA: Insufficient documentation

## 2019-02-11 DIAGNOSIS — I10 Essential (primary) hypertension: Secondary | ICD-10-CM | POA: Diagnosis not present

## 2019-02-11 DIAGNOSIS — Z800 Family history of malignant neoplasm of digestive organs: Secondary | ICD-10-CM | POA: Insufficient documentation

## 2019-02-11 DIAGNOSIS — Z170 Estrogen receptor positive status [ER+]: Secondary | ICD-10-CM | POA: Diagnosis not present

## 2019-02-12 ENCOUNTER — Other Ambulatory Visit: Payer: Self-pay | Admitting: *Deleted

## 2019-02-12 DIAGNOSIS — I10 Essential (primary) hypertension: Secondary | ICD-10-CM | POA: Diagnosis not present

## 2019-02-12 DIAGNOSIS — Z800 Family history of malignant neoplasm of digestive organs: Secondary | ICD-10-CM | POA: Diagnosis not present

## 2019-02-12 DIAGNOSIS — Z170 Estrogen receptor positive status [ER+]: Secondary | ICD-10-CM | POA: Diagnosis not present

## 2019-02-12 DIAGNOSIS — E119 Type 2 diabetes mellitus without complications: Secondary | ICD-10-CM | POA: Diagnosis not present

## 2019-02-12 DIAGNOSIS — D0511 Intraductal carcinoma in situ of right breast: Secondary | ICD-10-CM

## 2019-02-12 DIAGNOSIS — Z79899 Other long term (current) drug therapy: Secondary | ICD-10-CM | POA: Diagnosis not present

## 2019-02-12 DIAGNOSIS — Z801 Family history of malignant neoplasm of trachea, bronchus and lung: Secondary | ICD-10-CM | POA: Diagnosis not present

## 2019-02-12 DIAGNOSIS — E785 Hyperlipidemia, unspecified: Secondary | ICD-10-CM | POA: Diagnosis not present

## 2019-02-12 DIAGNOSIS — Z87891 Personal history of nicotine dependence: Secondary | ICD-10-CM | POA: Diagnosis not present

## 2019-02-12 DIAGNOSIS — Z803 Family history of malignant neoplasm of breast: Secondary | ICD-10-CM | POA: Diagnosis not present

## 2019-02-18 ENCOUNTER — Ambulatory Visit
Admission: RE | Admit: 2019-02-18 | Discharge: 2019-02-18 | Disposition: A | Discharge status: Home / Self Care | Payer: Medicare HMO | Source: Ambulatory Visit | Attending: Radiation Oncology | Admitting: Radiation Oncology

## 2019-02-18 ENCOUNTER — Other Ambulatory Visit: Payer: Self-pay

## 2019-02-18 DIAGNOSIS — Z800 Family history of malignant neoplasm of digestive organs: Secondary | ICD-10-CM | POA: Diagnosis not present

## 2019-02-18 DIAGNOSIS — D0511 Intraductal carcinoma in situ of right breast: Secondary | ICD-10-CM | POA: Diagnosis not present

## 2019-02-18 DIAGNOSIS — Z803 Family history of malignant neoplasm of breast: Secondary | ICD-10-CM | POA: Diagnosis not present

## 2019-02-18 DIAGNOSIS — E785 Hyperlipidemia, unspecified: Secondary | ICD-10-CM | POA: Diagnosis not present

## 2019-02-18 DIAGNOSIS — Z170 Estrogen receptor positive status [ER+]: Secondary | ICD-10-CM | POA: Diagnosis not present

## 2019-02-18 DIAGNOSIS — Z79899 Other long term (current) drug therapy: Secondary | ICD-10-CM | POA: Diagnosis not present

## 2019-02-18 DIAGNOSIS — I10 Essential (primary) hypertension: Secondary | ICD-10-CM | POA: Diagnosis not present

## 2019-02-18 DIAGNOSIS — Z87891 Personal history of nicotine dependence: Secondary | ICD-10-CM | POA: Diagnosis not present

## 2019-02-18 DIAGNOSIS — E119 Type 2 diabetes mellitus without complications: Secondary | ICD-10-CM | POA: Diagnosis not present

## 2019-02-18 DIAGNOSIS — Z801 Family history of malignant neoplasm of trachea, bronchus and lung: Secondary | ICD-10-CM | POA: Diagnosis not present

## 2019-02-22 ENCOUNTER — Other Ambulatory Visit: Payer: Self-pay

## 2019-02-22 ENCOUNTER — Ambulatory Visit
Admission: RE | Admit: 2019-02-22 | Discharge: 2019-02-22 | Disposition: A | Discharge status: Home / Self Care | Payer: Medicare HMO | Source: Ambulatory Visit | Attending: Radiation Oncology | Admitting: Radiation Oncology

## 2019-02-22 DIAGNOSIS — Z87891 Personal history of nicotine dependence: Secondary | ICD-10-CM | POA: Diagnosis not present

## 2019-02-22 DIAGNOSIS — Z801 Family history of malignant neoplasm of trachea, bronchus and lung: Secondary | ICD-10-CM | POA: Insufficient documentation

## 2019-02-22 DIAGNOSIS — E119 Type 2 diabetes mellitus without complications: Secondary | ICD-10-CM | POA: Insufficient documentation

## 2019-02-22 DIAGNOSIS — Z170 Estrogen receptor positive status [ER+]: Secondary | ICD-10-CM | POA: Diagnosis not present

## 2019-02-22 DIAGNOSIS — D0511 Intraductal carcinoma in situ of right breast: Secondary | ICD-10-CM | POA: Insufficient documentation

## 2019-02-22 DIAGNOSIS — I10 Essential (primary) hypertension: Secondary | ICD-10-CM | POA: Diagnosis not present

## 2019-02-22 DIAGNOSIS — Z79899 Other long term (current) drug therapy: Secondary | ICD-10-CM | POA: Diagnosis not present

## 2019-02-22 DIAGNOSIS — Z803 Family history of malignant neoplasm of breast: Secondary | ICD-10-CM | POA: Diagnosis not present

## 2019-02-22 DIAGNOSIS — E785 Hyperlipidemia, unspecified: Secondary | ICD-10-CM | POA: Insufficient documentation

## 2019-02-22 DIAGNOSIS — Z800 Family history of malignant neoplasm of digestive organs: Secondary | ICD-10-CM | POA: Insufficient documentation

## 2019-02-23 ENCOUNTER — Ambulatory Visit
Admission: RE | Admit: 2019-02-23 | Discharge: 2019-02-23 | Disposition: A | Discharge status: Home / Self Care | Payer: Medicare HMO | Source: Ambulatory Visit | Attending: Radiation Oncology | Admitting: Radiation Oncology

## 2019-02-23 ENCOUNTER — Other Ambulatory Visit: Payer: Self-pay

## 2019-02-23 DIAGNOSIS — Z803 Family history of malignant neoplasm of breast: Secondary | ICD-10-CM | POA: Diagnosis not present

## 2019-02-23 DIAGNOSIS — Z800 Family history of malignant neoplasm of digestive organs: Secondary | ICD-10-CM | POA: Diagnosis not present

## 2019-02-23 DIAGNOSIS — Z79899 Other long term (current) drug therapy: Secondary | ICD-10-CM | POA: Diagnosis not present

## 2019-02-23 DIAGNOSIS — D0511 Intraductal carcinoma in situ of right breast: Secondary | ICD-10-CM | POA: Diagnosis not present

## 2019-02-23 DIAGNOSIS — I10 Essential (primary) hypertension: Secondary | ICD-10-CM | POA: Diagnosis not present

## 2019-02-23 DIAGNOSIS — E785 Hyperlipidemia, unspecified: Secondary | ICD-10-CM | POA: Diagnosis not present

## 2019-02-23 DIAGNOSIS — Z87891 Personal history of nicotine dependence: Secondary | ICD-10-CM | POA: Diagnosis not present

## 2019-02-23 DIAGNOSIS — Z801 Family history of malignant neoplasm of trachea, bronchus and lung: Secondary | ICD-10-CM | POA: Diagnosis not present

## 2019-02-23 DIAGNOSIS — E1129 Type 2 diabetes mellitus with other diabetic kidney complication: Secondary | ICD-10-CM | POA: Diagnosis not present

## 2019-02-23 DIAGNOSIS — E119 Type 2 diabetes mellitus without complications: Secondary | ICD-10-CM | POA: Diagnosis not present

## 2019-02-23 DIAGNOSIS — Z170 Estrogen receptor positive status [ER+]: Secondary | ICD-10-CM | POA: Diagnosis not present

## 2019-02-23 DIAGNOSIS — N184 Chronic kidney disease, stage 4 (severe): Secondary | ICD-10-CM | POA: Diagnosis not present

## 2019-02-23 DIAGNOSIS — R600 Localized edema: Secondary | ICD-10-CM | POA: Diagnosis not present

## 2019-02-24 ENCOUNTER — Other Ambulatory Visit: Payer: Self-pay

## 2019-02-24 ENCOUNTER — Ambulatory Visit
Admission: RE | Admit: 2019-02-24 | Discharge: 2019-02-24 | Disposition: A | Discharge status: Home / Self Care | Payer: Medicare HMO | Source: Ambulatory Visit | Attending: Radiation Oncology | Admitting: Radiation Oncology

## 2019-02-24 DIAGNOSIS — Z800 Family history of malignant neoplasm of digestive organs: Secondary | ICD-10-CM | POA: Diagnosis not present

## 2019-02-24 DIAGNOSIS — E785 Hyperlipidemia, unspecified: Secondary | ICD-10-CM | POA: Diagnosis not present

## 2019-02-24 DIAGNOSIS — Z87891 Personal history of nicotine dependence: Secondary | ICD-10-CM | POA: Diagnosis not present

## 2019-02-24 DIAGNOSIS — Z801 Family history of malignant neoplasm of trachea, bronchus and lung: Secondary | ICD-10-CM | POA: Diagnosis not present

## 2019-02-24 DIAGNOSIS — E119 Type 2 diabetes mellitus without complications: Secondary | ICD-10-CM | POA: Diagnosis not present

## 2019-02-24 DIAGNOSIS — Z803 Family history of malignant neoplasm of breast: Secondary | ICD-10-CM | POA: Diagnosis not present

## 2019-02-24 DIAGNOSIS — I10 Essential (primary) hypertension: Secondary | ICD-10-CM | POA: Diagnosis not present

## 2019-02-24 DIAGNOSIS — D0511 Intraductal carcinoma in situ of right breast: Secondary | ICD-10-CM | POA: Diagnosis not present

## 2019-02-24 DIAGNOSIS — Z79899 Other long term (current) drug therapy: Secondary | ICD-10-CM | POA: Diagnosis not present

## 2019-02-25 ENCOUNTER — Ambulatory Visit
Admission: RE | Admit: 2019-02-25 | Discharge: 2019-02-25 | Disposition: A | Discharge status: Home / Self Care | Payer: Medicare HMO | Source: Ambulatory Visit | Attending: Radiation Oncology | Admitting: Radiation Oncology

## 2019-02-25 ENCOUNTER — Other Ambulatory Visit: Payer: Self-pay

## 2019-02-25 DIAGNOSIS — Z801 Family history of malignant neoplasm of trachea, bronchus and lung: Secondary | ICD-10-CM | POA: Diagnosis not present

## 2019-02-25 DIAGNOSIS — Z79899 Other long term (current) drug therapy: Secondary | ICD-10-CM | POA: Diagnosis not present

## 2019-02-25 DIAGNOSIS — I10 Essential (primary) hypertension: Secondary | ICD-10-CM | POA: Diagnosis not present

## 2019-02-25 DIAGNOSIS — E119 Type 2 diabetes mellitus without complications: Secondary | ICD-10-CM | POA: Diagnosis not present

## 2019-02-25 DIAGNOSIS — D0511 Intraductal carcinoma in situ of right breast: Secondary | ICD-10-CM | POA: Diagnosis not present

## 2019-02-25 DIAGNOSIS — Z803 Family history of malignant neoplasm of breast: Secondary | ICD-10-CM | POA: Diagnosis not present

## 2019-02-25 DIAGNOSIS — Z800 Family history of malignant neoplasm of digestive organs: Secondary | ICD-10-CM | POA: Diagnosis not present

## 2019-02-25 DIAGNOSIS — Z87891 Personal history of nicotine dependence: Secondary | ICD-10-CM | POA: Diagnosis not present

## 2019-02-25 DIAGNOSIS — Z170 Estrogen receptor positive status [ER+]: Secondary | ICD-10-CM | POA: Diagnosis not present

## 2019-02-25 DIAGNOSIS — E785 Hyperlipidemia, unspecified: Secondary | ICD-10-CM | POA: Diagnosis not present

## 2019-02-26 ENCOUNTER — Ambulatory Visit
Admission: RE | Admit: 2019-02-26 | Discharge: 2019-02-26 | Disposition: A | Discharge status: Home / Self Care | Payer: Medicare HMO | Source: Ambulatory Visit | Attending: Radiation Oncology | Admitting: Radiation Oncology

## 2019-02-26 ENCOUNTER — Other Ambulatory Visit: Payer: Self-pay

## 2019-02-26 DIAGNOSIS — E785 Hyperlipidemia, unspecified: Secondary | ICD-10-CM | POA: Diagnosis not present

## 2019-02-26 DIAGNOSIS — Z800 Family history of malignant neoplasm of digestive organs: Secondary | ICD-10-CM | POA: Diagnosis not present

## 2019-02-26 DIAGNOSIS — Z87891 Personal history of nicotine dependence: Secondary | ICD-10-CM | POA: Diagnosis not present

## 2019-02-26 DIAGNOSIS — E119 Type 2 diabetes mellitus without complications: Secondary | ICD-10-CM | POA: Diagnosis not present

## 2019-02-26 DIAGNOSIS — Z803 Family history of malignant neoplasm of breast: Secondary | ICD-10-CM | POA: Diagnosis not present

## 2019-02-26 DIAGNOSIS — D0511 Intraductal carcinoma in situ of right breast: Secondary | ICD-10-CM | POA: Diagnosis not present

## 2019-02-26 DIAGNOSIS — Z79899 Other long term (current) drug therapy: Secondary | ICD-10-CM | POA: Diagnosis not present

## 2019-02-26 DIAGNOSIS — I10 Essential (primary) hypertension: Secondary | ICD-10-CM | POA: Diagnosis not present

## 2019-02-26 DIAGNOSIS — Z801 Family history of malignant neoplasm of trachea, bronchus and lung: Secondary | ICD-10-CM | POA: Diagnosis not present

## 2019-03-01 ENCOUNTER — Other Ambulatory Visit: Payer: Self-pay

## 2019-03-01 ENCOUNTER — Ambulatory Visit
Admission: RE | Admit: 2019-03-01 | Discharge: 2019-03-01 | Disposition: A | Discharge status: Home / Self Care | Payer: Medicare HMO | Source: Ambulatory Visit | Attending: Radiation Oncology | Admitting: Radiation Oncology

## 2019-03-01 DIAGNOSIS — Z800 Family history of malignant neoplasm of digestive organs: Secondary | ICD-10-CM | POA: Diagnosis not present

## 2019-03-01 DIAGNOSIS — D0511 Intraductal carcinoma in situ of right breast: Secondary | ICD-10-CM | POA: Diagnosis not present

## 2019-03-01 DIAGNOSIS — Z803 Family history of malignant neoplasm of breast: Secondary | ICD-10-CM | POA: Diagnosis not present

## 2019-03-01 DIAGNOSIS — E119 Type 2 diabetes mellitus without complications: Secondary | ICD-10-CM | POA: Diagnosis not present

## 2019-03-01 DIAGNOSIS — I10 Essential (primary) hypertension: Secondary | ICD-10-CM | POA: Diagnosis not present

## 2019-03-01 DIAGNOSIS — N189 Chronic kidney disease, unspecified: Secondary | ICD-10-CM | POA: Diagnosis not present

## 2019-03-01 DIAGNOSIS — N2581 Secondary hyperparathyroidism of renal origin: Secondary | ICD-10-CM | POA: Diagnosis not present

## 2019-03-01 DIAGNOSIS — E785 Hyperlipidemia, unspecified: Secondary | ICD-10-CM | POA: Diagnosis not present

## 2019-03-01 DIAGNOSIS — N184 Chronic kidney disease, stage 4 (severe): Secondary | ICD-10-CM | POA: Diagnosis not present

## 2019-03-01 DIAGNOSIS — Z170 Estrogen receptor positive status [ER+]: Secondary | ICD-10-CM | POA: Diagnosis not present

## 2019-03-01 DIAGNOSIS — H34832 Tributary (branch) retinal vein occlusion, left eye, with macular edema: Secondary | ICD-10-CM | POA: Diagnosis not present

## 2019-03-01 DIAGNOSIS — Z79899 Other long term (current) drug therapy: Secondary | ICD-10-CM | POA: Diagnosis not present

## 2019-03-01 DIAGNOSIS — E1122 Type 2 diabetes mellitus with diabetic chronic kidney disease: Secondary | ICD-10-CM | POA: Diagnosis not present

## 2019-03-01 DIAGNOSIS — Z801 Family history of malignant neoplasm of trachea, bronchus and lung: Secondary | ICD-10-CM | POA: Diagnosis not present

## 2019-03-01 DIAGNOSIS — Z87891 Personal history of nicotine dependence: Secondary | ICD-10-CM | POA: Diagnosis not present

## 2019-03-01 DIAGNOSIS — I129 Hypertensive chronic kidney disease with stage 1 through stage 4 chronic kidney disease, or unspecified chronic kidney disease: Secondary | ICD-10-CM | POA: Diagnosis not present

## 2019-03-02 ENCOUNTER — Other Ambulatory Visit: Payer: Self-pay

## 2019-03-02 ENCOUNTER — Ambulatory Visit
Admission: RE | Admit: 2019-03-02 | Discharge: 2019-03-02 | Disposition: A | Discharge status: Home / Self Care | Payer: Medicare HMO | Source: Ambulatory Visit | Attending: Radiation Oncology | Admitting: Radiation Oncology

## 2019-03-02 DIAGNOSIS — Z801 Family history of malignant neoplasm of trachea, bronchus and lung: Secondary | ICD-10-CM | POA: Diagnosis not present

## 2019-03-02 DIAGNOSIS — I10 Essential (primary) hypertension: Secondary | ICD-10-CM | POA: Diagnosis not present

## 2019-03-02 DIAGNOSIS — Z800 Family history of malignant neoplasm of digestive organs: Secondary | ICD-10-CM | POA: Diagnosis not present

## 2019-03-02 DIAGNOSIS — Z87891 Personal history of nicotine dependence: Secondary | ICD-10-CM | POA: Diagnosis not present

## 2019-03-02 DIAGNOSIS — Z170 Estrogen receptor positive status [ER+]: Secondary | ICD-10-CM | POA: Diagnosis not present

## 2019-03-02 DIAGNOSIS — E119 Type 2 diabetes mellitus without complications: Secondary | ICD-10-CM | POA: Diagnosis not present

## 2019-03-02 DIAGNOSIS — Z79899 Other long term (current) drug therapy: Secondary | ICD-10-CM | POA: Diagnosis not present

## 2019-03-02 DIAGNOSIS — E785 Hyperlipidemia, unspecified: Secondary | ICD-10-CM | POA: Diagnosis not present

## 2019-03-02 DIAGNOSIS — H34832 Tributary (branch) retinal vein occlusion, left eye, with macular edema: Secondary | ICD-10-CM | POA: Diagnosis not present

## 2019-03-02 DIAGNOSIS — D0511 Intraductal carcinoma in situ of right breast: Secondary | ICD-10-CM | POA: Diagnosis not present

## 2019-03-02 DIAGNOSIS — Z803 Family history of malignant neoplasm of breast: Secondary | ICD-10-CM | POA: Diagnosis not present

## 2019-03-03 ENCOUNTER — Ambulatory Visit
Admission: RE | Admit: 2019-03-03 | Discharge: 2019-03-03 | Disposition: A | Discharge status: Home / Self Care | Payer: Medicare HMO | Source: Ambulatory Visit | Attending: Radiation Oncology | Admitting: Radiation Oncology

## 2019-03-03 ENCOUNTER — Other Ambulatory Visit: Payer: Self-pay

## 2019-03-03 DIAGNOSIS — D0511 Intraductal carcinoma in situ of right breast: Secondary | ICD-10-CM | POA: Diagnosis not present

## 2019-03-03 DIAGNOSIS — E119 Type 2 diabetes mellitus without complications: Secondary | ICD-10-CM | POA: Diagnosis not present

## 2019-03-03 DIAGNOSIS — Z801 Family history of malignant neoplasm of trachea, bronchus and lung: Secondary | ICD-10-CM | POA: Diagnosis not present

## 2019-03-03 DIAGNOSIS — E785 Hyperlipidemia, unspecified: Secondary | ICD-10-CM | POA: Diagnosis not present

## 2019-03-03 DIAGNOSIS — Z803 Family history of malignant neoplasm of breast: Secondary | ICD-10-CM | POA: Diagnosis not present

## 2019-03-03 DIAGNOSIS — Z79899 Other long term (current) drug therapy: Secondary | ICD-10-CM | POA: Diagnosis not present

## 2019-03-03 DIAGNOSIS — Z87891 Personal history of nicotine dependence: Secondary | ICD-10-CM | POA: Diagnosis not present

## 2019-03-03 DIAGNOSIS — Z170 Estrogen receptor positive status [ER+]: Secondary | ICD-10-CM | POA: Diagnosis not present

## 2019-03-03 DIAGNOSIS — Z800 Family history of malignant neoplasm of digestive organs: Secondary | ICD-10-CM | POA: Diagnosis not present

## 2019-03-03 DIAGNOSIS — I10 Essential (primary) hypertension: Secondary | ICD-10-CM | POA: Diagnosis not present

## 2019-03-04 ENCOUNTER — Other Ambulatory Visit: Payer: Self-pay

## 2019-03-04 ENCOUNTER — Ambulatory Visit
Admission: RE | Admit: 2019-03-04 | Discharge: 2019-03-04 | Disposition: A | Discharge status: Home / Self Care | Payer: Medicare HMO | Source: Ambulatory Visit | Attending: Radiation Oncology | Admitting: Radiation Oncology

## 2019-03-04 DIAGNOSIS — Z79899 Other long term (current) drug therapy: Secondary | ICD-10-CM | POA: Diagnosis not present

## 2019-03-04 DIAGNOSIS — Z170 Estrogen receptor positive status [ER+]: Secondary | ICD-10-CM | POA: Diagnosis not present

## 2019-03-04 DIAGNOSIS — E119 Type 2 diabetes mellitus without complications: Secondary | ICD-10-CM | POA: Diagnosis not present

## 2019-03-04 DIAGNOSIS — I10 Essential (primary) hypertension: Secondary | ICD-10-CM | POA: Diagnosis not present

## 2019-03-04 DIAGNOSIS — D0511 Intraductal carcinoma in situ of right breast: Secondary | ICD-10-CM | POA: Diagnosis not present

## 2019-03-04 DIAGNOSIS — Z801 Family history of malignant neoplasm of trachea, bronchus and lung: Secondary | ICD-10-CM | POA: Diagnosis not present

## 2019-03-04 DIAGNOSIS — E785 Hyperlipidemia, unspecified: Secondary | ICD-10-CM | POA: Diagnosis not present

## 2019-03-04 DIAGNOSIS — Z800 Family history of malignant neoplasm of digestive organs: Secondary | ICD-10-CM | POA: Diagnosis not present

## 2019-03-04 DIAGNOSIS — Z87891 Personal history of nicotine dependence: Secondary | ICD-10-CM | POA: Diagnosis not present

## 2019-03-04 DIAGNOSIS — Z803 Family history of malignant neoplasm of breast: Secondary | ICD-10-CM | POA: Diagnosis not present

## 2019-03-05 ENCOUNTER — Ambulatory Visit
Admission: RE | Admit: 2019-03-05 | Discharge: 2019-03-05 | Disposition: A | Discharge status: Home / Self Care | Payer: Medicare HMO | Source: Ambulatory Visit | Attending: Radiation Oncology | Admitting: Radiation Oncology

## 2019-03-05 ENCOUNTER — Other Ambulatory Visit: Payer: Self-pay

## 2019-03-05 DIAGNOSIS — E785 Hyperlipidemia, unspecified: Secondary | ICD-10-CM | POA: Diagnosis not present

## 2019-03-05 DIAGNOSIS — Z800 Family history of malignant neoplasm of digestive organs: Secondary | ICD-10-CM | POA: Diagnosis not present

## 2019-03-05 DIAGNOSIS — Z87891 Personal history of nicotine dependence: Secondary | ICD-10-CM | POA: Diagnosis not present

## 2019-03-05 DIAGNOSIS — I10 Essential (primary) hypertension: Secondary | ICD-10-CM | POA: Diagnosis not present

## 2019-03-05 DIAGNOSIS — D0511 Intraductal carcinoma in situ of right breast: Secondary | ICD-10-CM | POA: Diagnosis not present

## 2019-03-05 DIAGNOSIS — Z801 Family history of malignant neoplasm of trachea, bronchus and lung: Secondary | ICD-10-CM | POA: Diagnosis not present

## 2019-03-05 DIAGNOSIS — Z803 Family history of malignant neoplasm of breast: Secondary | ICD-10-CM | POA: Diagnosis not present

## 2019-03-05 DIAGNOSIS — Z170 Estrogen receptor positive status [ER+]: Secondary | ICD-10-CM | POA: Diagnosis not present

## 2019-03-05 DIAGNOSIS — E119 Type 2 diabetes mellitus without complications: Secondary | ICD-10-CM | POA: Diagnosis not present

## 2019-03-05 DIAGNOSIS — Z79899 Other long term (current) drug therapy: Secondary | ICD-10-CM | POA: Diagnosis not present

## 2019-03-08 ENCOUNTER — Other Ambulatory Visit: Payer: Self-pay

## 2019-03-08 ENCOUNTER — Ambulatory Visit
Admission: RE | Admit: 2019-03-08 | Discharge: 2019-03-08 | Disposition: A | Discharge status: Home / Self Care | Payer: Medicare HMO | Source: Ambulatory Visit | Attending: Radiation Oncology | Admitting: Radiation Oncology

## 2019-03-08 ENCOUNTER — Inpatient Hospital Stay: Payer: Medicare HMO | Attending: Radiation Oncology

## 2019-03-08 DIAGNOSIS — I10 Essential (primary) hypertension: Secondary | ICD-10-CM | POA: Diagnosis not present

## 2019-03-08 DIAGNOSIS — E119 Type 2 diabetes mellitus without complications: Secondary | ICD-10-CM | POA: Diagnosis not present

## 2019-03-08 DIAGNOSIS — Z800 Family history of malignant neoplasm of digestive organs: Secondary | ICD-10-CM | POA: Diagnosis not present

## 2019-03-08 DIAGNOSIS — E785 Hyperlipidemia, unspecified: Secondary | ICD-10-CM | POA: Diagnosis not present

## 2019-03-08 DIAGNOSIS — D0511 Intraductal carcinoma in situ of right breast: Secondary | ICD-10-CM | POA: Insufficient documentation

## 2019-03-08 DIAGNOSIS — Z79899 Other long term (current) drug therapy: Secondary | ICD-10-CM | POA: Diagnosis not present

## 2019-03-08 DIAGNOSIS — Z170 Estrogen receptor positive status [ER+]: Secondary | ICD-10-CM | POA: Diagnosis not present

## 2019-03-08 DIAGNOSIS — Z803 Family history of malignant neoplasm of breast: Secondary | ICD-10-CM | POA: Diagnosis not present

## 2019-03-08 DIAGNOSIS — Z801 Family history of malignant neoplasm of trachea, bronchus and lung: Secondary | ICD-10-CM | POA: Diagnosis not present

## 2019-03-08 DIAGNOSIS — Z87891 Personal history of nicotine dependence: Secondary | ICD-10-CM | POA: Diagnosis not present

## 2019-03-08 LAB — CBC
HCT: 33.8 % — ABNORMAL LOW (ref 36.0–46.0)
Hemoglobin: 10.5 g/dL — ABNORMAL LOW (ref 12.0–15.0)
MCH: 28.8 pg (ref 26.0–34.0)
MCHC: 31.1 g/dL (ref 30.0–36.0)
MCV: 92.9 fL (ref 80.0–100.0)
Platelets: 134 10*3/uL — ABNORMAL LOW (ref 150–400)
RBC: 3.64 MIL/uL — ABNORMAL LOW (ref 3.87–5.11)
RDW: 16.3 % — ABNORMAL HIGH (ref 11.5–15.5)
WBC: 6.3 10*3/uL (ref 4.0–10.5)
nRBC: 0 % (ref 0.0–0.2)

## 2019-03-09 ENCOUNTER — Other Ambulatory Visit: Payer: Self-pay

## 2019-03-09 ENCOUNTER — Ambulatory Visit
Admission: RE | Admit: 2019-03-09 | Discharge: 2019-03-09 | Disposition: A | Discharge status: Home / Self Care | Payer: Medicare HMO | Source: Ambulatory Visit | Attending: Radiation Oncology | Admitting: Radiation Oncology

## 2019-03-09 DIAGNOSIS — E119 Type 2 diabetes mellitus without complications: Secondary | ICD-10-CM | POA: Diagnosis not present

## 2019-03-09 DIAGNOSIS — Z803 Family history of malignant neoplasm of breast: Secondary | ICD-10-CM | POA: Diagnosis not present

## 2019-03-09 DIAGNOSIS — Z800 Family history of malignant neoplasm of digestive organs: Secondary | ICD-10-CM | POA: Diagnosis not present

## 2019-03-09 DIAGNOSIS — Z170 Estrogen receptor positive status [ER+]: Secondary | ICD-10-CM | POA: Diagnosis not present

## 2019-03-09 DIAGNOSIS — I10 Essential (primary) hypertension: Secondary | ICD-10-CM | POA: Diagnosis not present

## 2019-03-09 DIAGNOSIS — Z801 Family history of malignant neoplasm of trachea, bronchus and lung: Secondary | ICD-10-CM | POA: Diagnosis not present

## 2019-03-09 DIAGNOSIS — E785 Hyperlipidemia, unspecified: Secondary | ICD-10-CM | POA: Diagnosis not present

## 2019-03-09 DIAGNOSIS — Z79899 Other long term (current) drug therapy: Secondary | ICD-10-CM | POA: Diagnosis not present

## 2019-03-09 DIAGNOSIS — D0511 Intraductal carcinoma in situ of right breast: Secondary | ICD-10-CM | POA: Diagnosis not present

## 2019-03-09 DIAGNOSIS — Z87891 Personal history of nicotine dependence: Secondary | ICD-10-CM | POA: Diagnosis not present

## 2019-03-10 ENCOUNTER — Other Ambulatory Visit: Payer: Self-pay

## 2019-03-10 ENCOUNTER — Ambulatory Visit
Admission: RE | Admit: 2019-03-10 | Discharge: 2019-03-10 | Disposition: A | Discharge status: Home / Self Care | Payer: Medicare HMO | Source: Ambulatory Visit | Attending: Radiation Oncology | Admitting: Radiation Oncology

## 2019-03-10 DIAGNOSIS — Z79899 Other long term (current) drug therapy: Secondary | ICD-10-CM | POA: Diagnosis not present

## 2019-03-10 DIAGNOSIS — Z801 Family history of malignant neoplasm of trachea, bronchus and lung: Secondary | ICD-10-CM | POA: Diagnosis not present

## 2019-03-10 DIAGNOSIS — Z803 Family history of malignant neoplasm of breast: Secondary | ICD-10-CM | POA: Diagnosis not present

## 2019-03-10 DIAGNOSIS — D0511 Intraductal carcinoma in situ of right breast: Secondary | ICD-10-CM | POA: Diagnosis not present

## 2019-03-10 DIAGNOSIS — I10 Essential (primary) hypertension: Secondary | ICD-10-CM | POA: Diagnosis not present

## 2019-03-10 DIAGNOSIS — E119 Type 2 diabetes mellitus without complications: Secondary | ICD-10-CM | POA: Diagnosis not present

## 2019-03-10 DIAGNOSIS — Z87891 Personal history of nicotine dependence: Secondary | ICD-10-CM | POA: Diagnosis not present

## 2019-03-10 DIAGNOSIS — Z170 Estrogen receptor positive status [ER+]: Secondary | ICD-10-CM | POA: Diagnosis not present

## 2019-03-10 DIAGNOSIS — E785 Hyperlipidemia, unspecified: Secondary | ICD-10-CM | POA: Diagnosis not present

## 2019-03-10 DIAGNOSIS — Z800 Family history of malignant neoplasm of digestive organs: Secondary | ICD-10-CM | POA: Diagnosis not present

## 2019-03-11 ENCOUNTER — Other Ambulatory Visit: Payer: Self-pay

## 2019-03-11 ENCOUNTER — Ambulatory Visit
Admission: RE | Admit: 2019-03-11 | Discharge: 2019-03-11 | Disposition: A | Discharge status: Home / Self Care | Payer: Medicare HMO | Source: Ambulatory Visit | Attending: Radiation Oncology | Admitting: Radiation Oncology

## 2019-03-11 DIAGNOSIS — E785 Hyperlipidemia, unspecified: Secondary | ICD-10-CM | POA: Diagnosis not present

## 2019-03-11 DIAGNOSIS — Z801 Family history of malignant neoplasm of trachea, bronchus and lung: Secondary | ICD-10-CM | POA: Diagnosis not present

## 2019-03-11 DIAGNOSIS — Z87891 Personal history of nicotine dependence: Secondary | ICD-10-CM | POA: Diagnosis not present

## 2019-03-11 DIAGNOSIS — Z803 Family history of malignant neoplasm of breast: Secondary | ICD-10-CM | POA: Diagnosis not present

## 2019-03-11 DIAGNOSIS — Z170 Estrogen receptor positive status [ER+]: Secondary | ICD-10-CM | POA: Diagnosis not present

## 2019-03-11 DIAGNOSIS — I10 Essential (primary) hypertension: Secondary | ICD-10-CM | POA: Diagnosis not present

## 2019-03-11 DIAGNOSIS — Z79899 Other long term (current) drug therapy: Secondary | ICD-10-CM | POA: Diagnosis not present

## 2019-03-11 DIAGNOSIS — E119 Type 2 diabetes mellitus without complications: Secondary | ICD-10-CM | POA: Diagnosis not present

## 2019-03-11 DIAGNOSIS — Z800 Family history of malignant neoplasm of digestive organs: Secondary | ICD-10-CM | POA: Diagnosis not present

## 2019-03-11 DIAGNOSIS — D0511 Intraductal carcinoma in situ of right breast: Secondary | ICD-10-CM | POA: Diagnosis not present

## 2019-03-12 ENCOUNTER — Other Ambulatory Visit: Payer: Self-pay

## 2019-03-12 ENCOUNTER — Ambulatory Visit
Admission: RE | Admit: 2019-03-12 | Discharge: 2019-03-12 | Disposition: A | Discharge status: Home / Self Care | Payer: Medicare HMO | Source: Ambulatory Visit | Attending: Radiation Oncology | Admitting: Radiation Oncology

## 2019-03-12 DIAGNOSIS — E119 Type 2 diabetes mellitus without complications: Secondary | ICD-10-CM | POA: Diagnosis not present

## 2019-03-12 DIAGNOSIS — D0511 Intraductal carcinoma in situ of right breast: Secondary | ICD-10-CM | POA: Diagnosis not present

## 2019-03-12 DIAGNOSIS — Z87891 Personal history of nicotine dependence: Secondary | ICD-10-CM | POA: Diagnosis not present

## 2019-03-12 DIAGNOSIS — I10 Essential (primary) hypertension: Secondary | ICD-10-CM | POA: Diagnosis not present

## 2019-03-12 DIAGNOSIS — Z803 Family history of malignant neoplasm of breast: Secondary | ICD-10-CM | POA: Diagnosis not present

## 2019-03-12 DIAGNOSIS — E785 Hyperlipidemia, unspecified: Secondary | ICD-10-CM | POA: Diagnosis not present

## 2019-03-12 DIAGNOSIS — Z79899 Other long term (current) drug therapy: Secondary | ICD-10-CM | POA: Diagnosis not present

## 2019-03-12 DIAGNOSIS — Z170 Estrogen receptor positive status [ER+]: Secondary | ICD-10-CM | POA: Diagnosis not present

## 2019-03-12 DIAGNOSIS — Z801 Family history of malignant neoplasm of trachea, bronchus and lung: Secondary | ICD-10-CM | POA: Diagnosis not present

## 2019-03-12 DIAGNOSIS — Z800 Family history of malignant neoplasm of digestive organs: Secondary | ICD-10-CM | POA: Diagnosis not present

## 2019-03-15 ENCOUNTER — Ambulatory Visit
Admission: RE | Admit: 2019-03-15 | Discharge: 2019-03-15 | Disposition: A | Discharge status: Home / Self Care | Payer: Medicare HMO | Source: Ambulatory Visit | Attending: Radiation Oncology | Admitting: Radiation Oncology

## 2019-03-15 ENCOUNTER — Other Ambulatory Visit: Payer: Self-pay

## 2019-03-15 DIAGNOSIS — Z79899 Other long term (current) drug therapy: Secondary | ICD-10-CM | POA: Diagnosis not present

## 2019-03-15 DIAGNOSIS — Z801 Family history of malignant neoplasm of trachea, bronchus and lung: Secondary | ICD-10-CM | POA: Diagnosis not present

## 2019-03-15 DIAGNOSIS — I10 Essential (primary) hypertension: Secondary | ICD-10-CM | POA: Diagnosis not present

## 2019-03-15 DIAGNOSIS — E119 Type 2 diabetes mellitus without complications: Secondary | ICD-10-CM | POA: Diagnosis not present

## 2019-03-15 DIAGNOSIS — Z87891 Personal history of nicotine dependence: Secondary | ICD-10-CM | POA: Diagnosis not present

## 2019-03-15 DIAGNOSIS — Z800 Family history of malignant neoplasm of digestive organs: Secondary | ICD-10-CM | POA: Diagnosis not present

## 2019-03-15 DIAGNOSIS — Z170 Estrogen receptor positive status [ER+]: Secondary | ICD-10-CM | POA: Diagnosis not present

## 2019-03-15 DIAGNOSIS — D0511 Intraductal carcinoma in situ of right breast: Secondary | ICD-10-CM | POA: Diagnosis not present

## 2019-03-15 DIAGNOSIS — E785 Hyperlipidemia, unspecified: Secondary | ICD-10-CM | POA: Diagnosis not present

## 2019-03-15 DIAGNOSIS — Z803 Family history of malignant neoplasm of breast: Secondary | ICD-10-CM | POA: Diagnosis not present

## 2019-03-15 MED ORDER — ROSUVASTATIN CALCIUM 20 MG PO TABS: ORAL_TABLET | ORAL | 3 refills | Status: DC

## 2019-03-15 MED ORDER — LEVEMIR FLEXTOUCH 100 UNIT/ML ~~LOC~~ SOPN: 75.0000 [IU] | PEN_INJECTOR | Freq: Every day | SUBCUTANEOUS | 3 refills | Status: DC

## 2019-03-16 ENCOUNTER — Other Ambulatory Visit: Payer: Self-pay

## 2019-03-16 ENCOUNTER — Ambulatory Visit
Admission: RE | Admit: 2019-03-16 | Discharge: 2019-03-16 | Disposition: A | Discharge status: Home / Self Care | Payer: Medicare HMO | Source: Ambulatory Visit | Attending: Radiation Oncology | Admitting: Radiation Oncology

## 2019-03-16 DIAGNOSIS — E785 Hyperlipidemia, unspecified: Secondary | ICD-10-CM | POA: Diagnosis not present

## 2019-03-16 DIAGNOSIS — Z170 Estrogen receptor positive status [ER+]: Secondary | ICD-10-CM | POA: Diagnosis not present

## 2019-03-16 DIAGNOSIS — D0511 Intraductal carcinoma in situ of right breast: Secondary | ICD-10-CM | POA: Diagnosis not present

## 2019-03-16 DIAGNOSIS — Z79899 Other long term (current) drug therapy: Secondary | ICD-10-CM | POA: Diagnosis not present

## 2019-03-16 DIAGNOSIS — Z800 Family history of malignant neoplasm of digestive organs: Secondary | ICD-10-CM | POA: Diagnosis not present

## 2019-03-16 DIAGNOSIS — E119 Type 2 diabetes mellitus without complications: Secondary | ICD-10-CM | POA: Diagnosis not present

## 2019-03-16 DIAGNOSIS — Z803 Family history of malignant neoplasm of breast: Secondary | ICD-10-CM | POA: Diagnosis not present

## 2019-03-16 DIAGNOSIS — I10 Essential (primary) hypertension: Secondary | ICD-10-CM | POA: Diagnosis not present

## 2019-03-16 DIAGNOSIS — Z87891 Personal history of nicotine dependence: Secondary | ICD-10-CM | POA: Diagnosis not present

## 2019-03-16 DIAGNOSIS — Z801 Family history of malignant neoplasm of trachea, bronchus and lung: Secondary | ICD-10-CM | POA: Diagnosis not present

## 2019-03-17 ENCOUNTER — Ambulatory Visit
Admission: RE | Admit: 2019-03-17 | Discharge: 2019-03-17 | Disposition: A | Discharge status: Home / Self Care | Payer: Medicare HMO | Source: Ambulatory Visit | Attending: Radiation Oncology | Admitting: Radiation Oncology

## 2019-03-17 ENCOUNTER — Other Ambulatory Visit: Payer: Self-pay

## 2019-03-17 DIAGNOSIS — Z87891 Personal history of nicotine dependence: Secondary | ICD-10-CM | POA: Diagnosis not present

## 2019-03-17 DIAGNOSIS — Z801 Family history of malignant neoplasm of trachea, bronchus and lung: Secondary | ICD-10-CM | POA: Diagnosis not present

## 2019-03-17 DIAGNOSIS — E785 Hyperlipidemia, unspecified: Secondary | ICD-10-CM | POA: Diagnosis not present

## 2019-03-17 DIAGNOSIS — I10 Essential (primary) hypertension: Secondary | ICD-10-CM | POA: Diagnosis not present

## 2019-03-17 DIAGNOSIS — E119 Type 2 diabetes mellitus without complications: Secondary | ICD-10-CM | POA: Diagnosis not present

## 2019-03-17 DIAGNOSIS — Z170 Estrogen receptor positive status [ER+]: Secondary | ICD-10-CM | POA: Diagnosis not present

## 2019-03-17 DIAGNOSIS — Z803 Family history of malignant neoplasm of breast: Secondary | ICD-10-CM | POA: Diagnosis not present

## 2019-03-17 DIAGNOSIS — Z800 Family history of malignant neoplasm of digestive organs: Secondary | ICD-10-CM | POA: Diagnosis not present

## 2019-03-17 DIAGNOSIS — D0511 Intraductal carcinoma in situ of right breast: Secondary | ICD-10-CM | POA: Diagnosis not present

## 2019-03-17 DIAGNOSIS — Z79899 Other long term (current) drug therapy: Secondary | ICD-10-CM | POA: Diagnosis not present

## 2019-03-17 NOTE — Progress Notes (Deleted)
[place]  [phone] [phone]  ID: [name] OB: Dec 15, 1945  MR#: [id number]  UKG#:[id number]  Patient Care Team: Lavera Guise, MD as PCP - General (Internal Medicine)  CHIEF COMPLAINT: Low-grade DCIS of the right breast.  INTERVAL HISTORY: Patient returns to clinic today after her lumpectomy to discuss her final pathology results and treatment planning.  She continues to be highly anxious.  She tolerated her surgery well without significant side effects. She has no neurologic complaints.  She denies any recent fevers or illnesses.  She has a good appetite and denies weight loss.  She has no chest pain, shortness of breath, cough, or hemoptysis.  She denies any nausea, vomiting, constipation, or diarrhea.  She has no urinary complaints.  Patient offers no further specific complaints today.  REVIEW OF SYSTEMS:   Review of Systems  Constitutional: Negative.  Negative for fever, malaise/fatigue and weight loss.  Respiratory: Negative.  Negative for cough, hemoptysis and shortness of breath.   Cardiovascular: Negative.  Negative for chest pain and leg swelling.  Gastrointestinal: Negative.  Negative for abdominal pain.  Genitourinary: Negative.  Negative for dysuria.  Musculoskeletal: Negative.  Negative for back pain.  Skin: Negative.  Negative for rash.  Neurological: Negative.  Negative for dizziness, focal weakness, weakness and headaches.  Psychiatric/Behavioral: The patient is nervous/anxious.     As per HPI. Otherwise, a complete review of systems is negative.  PAST MEDICAL HISTORY: Past Medical History:  Diagnosis Date  . Anemia   . Arthropathy   . Benign neoplasm of breast   . Chest pain   . Chronic kidney disease    30% kidney function  . Diabetes ([place])   . Diabetic retinopathy ([place])   . Dyspnea   . Edema   . History of kidney stones   . Hyperlipemia   . Hypersomnia with sleep apnea   . Hypertension   . 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 Lumbago   . Osteoarthrosis   . Ovarian failure   . SOB (shortness of breath)     PAST SURGICAL HISTORY: Past Surgical History:  Procedure Laterality Date  . BILATERAL SALPINGECTOMY    . BREAST BIOPSY Right 2014   neg- core  . BREAST BIOPSY Right 12/21/2018   [place] bx, venus clip,  DUCTAL CARCINOMA IN SITU  . BREAST BIOPSY Right 12/21/2018   [place] bx, ribbon clip,  FIBROEPITHELIAL PROLIFERATION WITH SCLEROSIS  . BREAST BIOPSY Right 01/01/2019   Affirm bx "X" clip-path pending  . BREAST DUCTAL SYSTEM EXCISION    . BREAST EXCISIONAL BIOPSY Right 2014   neg  . BREAST LUMPECTOMY Right 01/22/2019   path pending  . CATARACT EXTRACTION    . CHOLECYSTECTOMY    . CYSTOSCOPY WITH STENT PLACEMENT Left 04/14/2015   Procedure: CYSTOSCOPY WITH STENT PLACEMENT;  Surgeon: Hollice Espy, MD;  Location: [facility];  Service: Urology;  Laterality: Left;  . EXTRACORPOREAL SHOCK WAVE LITHOTRIPSY Left 09/29/2014   Procedure: EXTRACORPOREAL SHOCK WAVE LITHOTRIPSY (ESWL);  Surgeon: Hollice Espy, MD;  Location: [facility];  Service: Urology;  Laterality: Left;  . EYE SURGERY     bilateral cataract  . LAPAROSCOPIC OOPHERECTOMY Left   . PARTIAL MASTECTOMY WITH NEEDLE LOCALIZATION Right 01/22/2019   Procedure: PARTIAL MASTECTOMY WITH NEEDLE LOCALIZATION;  Surgeon: Herbert Pun, MD;  Location: [facility];  Service: General;  Laterality: Right;  . TONSILLECTOMY    . TUBAL LIGATION    . URETEROSCOPY WITH HOLMIUM LASER LITHOTRIPSY Left  04/14/2015   Procedure: URETEROSCOPY WITH HOLMIUM LASER LITHOTRIPSY;  Surgeon: Hollice Espy, MD;  Location: [facility];  Service: Urology;  Laterality: Left;    FAMILY HISTORY: Family History  Problem Relation Age of Onset  . Breast cancer Maternal Grandmother 11  . Colon cancer Mother   . Lung cancer Father     ADVANCED DIRECTIVES (Y/N):  N  HEALTH MAINTENANCE: Social History   Tobacco Use  . Smoking status: Former Smoker     Packs/day: 1.00    Years: 30.00    Pack years: 30.00    Quit date: 04/11/1995    Years since quitting: 23.9  . Smokeless tobacco: Never Used  Substance Use Topics  . Alcohol use: No    Alcohol/week: 0.0 standard drinks  . Drug use: No     Colonoscopy:  PAP:  Bone density:  Lipid panel:  No Known Allergies  Current Outpatient Medications  Medication Sig Dispense Refill  . Aflibercept (EYLEA) 2 MG/0.05ML SOLN     . APPLE CIDER VINEGAR PO Take 1 tablet by mouth daily.    Marland Kitchen aspirin EC 81 MG tablet Take 81 mg by mouth daily.     . cetirizine (ZYRTEC) 10 MG tablet Take 10 mg by mouth daily as needed for allergies.     . cholecalciferol (VITAMIN D) 1000 UNITS tablet Take 1,000 Units by mouth daily.     . Ferrous Gluconate 324 (37.5 Fe) MG TABS Take 324 mg by mouth 2 (two) times daily.     . furosemide (LASIX) 20 MG tablet Take 20 mg by mouth 2 (two) times daily as needed.  5  . glimepiride (AMARYL) 4 MG tablet Take 1 tablet (4 mg total) by mouth 2 (two) times daily with a meal. 180 tablet 1  . Glucos-Chond-Hyal Ac-Ca Fructo (MOVE FREE JOINT HEALTH ADVANCE) TABS Take 1 tablet by mouth daily.    . Insulin Detemir (LEVEMIR FLEXTOUCH) 100 UNIT/ML Pen Inject 75 Units into the skin daily after supper. 21 mL 3  . lisinopril (PRINIVIL,ZESTRIL) 20 MG tablet Take 20 mg by mouth daily.    Marland Kitchen loperamide (IMODIUM A-D) 2 MG tablet Take 2 mg by mouth as needed for diarrhea or loose stools.    Marland Kitchen MISC NATURAL PRODUCTS ER PO Take 1 capsule by mouth 2 (two) times daily. Blood sugar support    . Multiple Vitamin (MULTIVITAMIN) tablet Take 1 tablet by mouth daily at 12 noon.    . pantoprazole (PROTONIX) 40 MG tablet TAKE 1 TABLET(S) BY MOUTH DAILY 90 tablet 1  . pregabalin (LYRICA) 100 MG capsule Take 1 capsule (100 mg total) by mouth 3 (three) times daily. (Patient taking differently: Take 100 mg by mouth 2 (two) times daily. ) 90 capsule 3  . rosuvastatin (CRESTOR) 20 MG tablet TAKE 1 TABLET BY MOUTH  EVERYDAY AT BEDTIME 90 tablet 3  . traMADol-acetaminophen (ULTRACET) 37.5-325 MG tablet Take 1 tablet by mouth 2 (two) times daily as needed for moderate pain. 60 tablet 2  . TRULICITY 1.5 PY/0.9XI SOPN Inject 1.73m [place] once weekly (Patient taking differently: Inject 1.5 mg into the skin every 7 (seven) days. ) 4 pen 5  . verapamil (CALAN) 40 MG tablet TAKE 1 TABLET(S) BY MOUTH TWICE A DAY FOR HYPERTENSION (Patient taking differently: Take 40 mg by mouth 2 (two) times daily. ) 60 tablet 4   No current facility-administered medications for this visit.     OBJECTIVE: There were no vitals filed for this visit.   There  is no height or weight on file to calculate BMI.    ECOG FS:0 - Asymptomatic  General: Well-developed, well-nourished, no acute distress. Eyes: Pink conjunctiva, anicteric sclera. HEENT: Normocephalic, moist mucous membranes. Breast: Well-healing surgical scar on right breast. Lungs: Clear to auscultation bilaterally. Heart: Regular rate and rhythm. No rubs, murmurs, or gallops. Abdomen: Soft, nontender, nondistended. No organomegaly noted, normoactive bowel sounds. Musculoskeletal: No edema, cyanosis, or clubbing. Neuro: Alert, answering all questions appropriately. Cranial nerves grossly intact. Skin: No rashes or petechiae noted. Psych: Normal affect.  LAB RESULTS:  Lab Results  Component Value Date   NA 141 03/08/2013   K 4.1 03/08/2013   CL 106 03/08/2013   CO2 30 03/08/2013   GLUCOSE 170 (H) 03/08/2013   BUN 26 (H) 03/08/2013   CREATININE 1.47 (H) 03/08/2013   CALCIUM 9.7 03/08/2013   PROT 7.7 07/23/2011   ALBUMIN 3.5 07/23/2011   AST 16 07/23/2011   ALT 24 07/23/2011   ALKPHOS 85 07/23/2011   BILITOT 0.4 07/23/2011   GFRNONAA 37 (L) 03/08/2013   GFRAA 43 (L) 03/08/2013    Lab Results  Component Value Date   WBC 6.3 03/08/2019   NEUTROABS 3.4 07/23/2011   HGB 10.5 (L) 03/08/2019   HCT 33.8 (L) 03/08/2019   MCV 92.9 03/08/2019   PLT 134 (L)  03/08/2019     STUDIES: No results found.  ASSESSMENT: Low-grade DCIS of the right breast.  PLAN:    1. Low-grade DCIS of the right breast: Patient declined enrollment in COMET trial.  She underwent lumpectomy on January 22, 2019.  Final pathology noted close, but clear margins.  I recommended adjuvant XRT and patient has an appointment with radiation oncology later today.  Upon completion of her XRT, she will also require tamoxifen for total of 5 years.  Return to clinic the first week of December near the end of her XRT for further evaluation and initiation of tamoxifen.  I spent a total of 30 minutes face-to-face with the patient of which greater than 50% of the visit was spent in counseling and coordination of care as detailed above.   Patient expressed understanding and was in agreement with this plan. She also understands that She can call clinic at any time with any questions, concerns, or complaints.   Cancer Staging Ductal carcinoma in situ (DCIS) of right breast Staging form: Breast, AJCC 8th Edition - Clinical stage from 01/01/2019: Stage 0 (cTis (DCIS), cN0, cM0, G1, ER+, PR: Not Assessed, HER2: Not Assessed) - Signed by Lloyd Huger, MD on 01/01/2019   Lloyd Huger, MD   03/17/2019 2:46 PM

## 2019-03-21 ENCOUNTER — Other Ambulatory Visit: Payer: Self-pay

## 2019-03-21 ENCOUNTER — Inpatient Hospital Stay
Admission: EM | Admit: 2019-03-21 | Discharge: 2019-03-25 | DRG: 584 | Disposition: A | Discharge status: Home Health | Payer: Medicare HMO | Attending: Internal Medicine | Admitting: Internal Medicine

## 2019-03-21 ENCOUNTER — Emergency Department: Payer: Medicare HMO

## 2019-03-21 DIAGNOSIS — Z801 Family history of malignant neoplasm of trachea, bronchus and lung: Secondary | ICD-10-CM

## 2019-03-21 DIAGNOSIS — I700 Atherosclerosis of aorta: Secondary | ICD-10-CM | POA: Diagnosis not present

## 2019-03-21 DIAGNOSIS — N189 Chronic kidney disease, unspecified: Secondary | ICD-10-CM | POA: Diagnosis not present

## 2019-03-21 DIAGNOSIS — N184 Chronic kidney disease, stage 4 (severe): Secondary | ICD-10-CM

## 2019-03-21 DIAGNOSIS — Z803 Family history of malignant neoplasm of breast: Secondary | ICD-10-CM

## 2019-03-21 DIAGNOSIS — N179 Acute kidney failure, unspecified: Secondary | ICD-10-CM | POA: Diagnosis not present

## 2019-03-21 DIAGNOSIS — D509 Iron deficiency anemia, unspecified: Secondary | ICD-10-CM | POA: Diagnosis present

## 2019-03-21 DIAGNOSIS — E1165 Type 2 diabetes mellitus with hyperglycemia: Secondary | ICD-10-CM | POA: Diagnosis not present

## 2019-03-21 DIAGNOSIS — E669 Obesity, unspecified: Secondary | ICD-10-CM | POA: Diagnosis present

## 2019-03-21 DIAGNOSIS — K219 Gastro-esophageal reflux disease without esophagitis: Secondary | ICD-10-CM | POA: Diagnosis present

## 2019-03-21 DIAGNOSIS — D0511 Intraductal carcinoma in situ of right breast: Secondary | ICD-10-CM | POA: Diagnosis present

## 2019-03-21 DIAGNOSIS — A419 Sepsis, unspecified organism: Secondary | ICD-10-CM | POA: Diagnosis not present

## 2019-03-21 DIAGNOSIS — N61 Mastitis without abscess: Secondary | ICD-10-CM

## 2019-03-21 DIAGNOSIS — R202 Paresthesia of skin: Secondary | ICD-10-CM

## 2019-03-21 DIAGNOSIS — R6520 Severe sepsis without septic shock: Secondary | ICD-10-CM | POA: Diagnosis present

## 2019-03-21 DIAGNOSIS — L309 Dermatitis, unspecified: Secondary | ICD-10-CM | POA: Diagnosis not present

## 2019-03-21 DIAGNOSIS — Z79899 Other long term (current) drug therapy: Secondary | ICD-10-CM | POA: Diagnosis not present

## 2019-03-21 DIAGNOSIS — E11319 Type 2 diabetes mellitus with unspecified diabetic retinopathy without macular edema: Secondary | ICD-10-CM | POA: Diagnosis present

## 2019-03-21 DIAGNOSIS — G4730 Sleep apnea, unspecified: Secondary | ICD-10-CM | POA: Diagnosis present

## 2019-03-21 DIAGNOSIS — Z6839 Body mass index (BMI) 39.0-39.9, adult: Secondary | ICD-10-CM

## 2019-03-21 DIAGNOSIS — E119 Type 2 diabetes mellitus without complications: Secondary | ICD-10-CM | POA: Diagnosis not present

## 2019-03-21 DIAGNOSIS — R21 Rash and other nonspecific skin eruption: Secondary | ICD-10-CM | POA: Diagnosis not present

## 2019-03-21 DIAGNOSIS — N611 Abscess of the breast and nipple: Principal | ICD-10-CM | POA: Diagnosis present

## 2019-03-21 DIAGNOSIS — G4710 Hypersomnia, unspecified: Secondary | ICD-10-CM | POA: Diagnosis present

## 2019-03-21 DIAGNOSIS — I10 Essential (primary) hypertension: Secondary | ICD-10-CM

## 2019-03-21 DIAGNOSIS — E11649 Type 2 diabetes mellitus with hypoglycemia without coma: Secondary | ICD-10-CM | POA: Diagnosis not present

## 2019-03-21 DIAGNOSIS — R000 Tachycardia, unspecified: Secondary | ICD-10-CM | POA: Diagnosis not present

## 2019-03-21 DIAGNOSIS — D631 Anemia in chronic kidney disease: Secondary | ICD-10-CM | POA: Diagnosis not present

## 2019-03-21 DIAGNOSIS — I129 Hypertensive chronic kidney disease with stage 1 through stage 4 chronic kidney disease, or unspecified chronic kidney disease: Secondary | ICD-10-CM | POA: Diagnosis not present

## 2019-03-21 DIAGNOSIS — R509 Fever, unspecified: Secondary | ICD-10-CM

## 2019-03-21 DIAGNOSIS — Z800 Family history of malignant neoplasm of digestive organs: Secondary | ICD-10-CM

## 2019-03-21 DIAGNOSIS — Z7982 Long term (current) use of aspirin: Secondary | ICD-10-CM

## 2019-03-21 DIAGNOSIS — E1140 Type 2 diabetes mellitus with diabetic neuropathy, unspecified: Secondary | ICD-10-CM | POA: Diagnosis not present

## 2019-03-21 DIAGNOSIS — B372 Candidiasis of skin and nail: Secondary | ICD-10-CM | POA: Diagnosis present

## 2019-03-21 DIAGNOSIS — Z66 Do not resuscitate: Secondary | ICD-10-CM | POA: Diagnosis not present

## 2019-03-21 DIAGNOSIS — Z794 Long term (current) use of insulin: Secondary | ICD-10-CM

## 2019-03-21 DIAGNOSIS — Z20828 Contact with and (suspected) exposure to other viral communicable diseases: Secondary | ICD-10-CM | POA: Diagnosis not present

## 2019-03-21 DIAGNOSIS — B379 Candidiasis, unspecified: Secondary | ICD-10-CM | POA: Diagnosis not present

## 2019-03-21 DIAGNOSIS — K589 Irritable bowel syndrome without diarrhea: Secondary | ICD-10-CM | POA: Diagnosis present

## 2019-03-21 DIAGNOSIS — E1142 Type 2 diabetes mellitus with diabetic polyneuropathy: Secondary | ICD-10-CM | POA: Diagnosis present

## 2019-03-21 DIAGNOSIS — Z9011 Acquired absence of right breast and nipple: Secondary | ICD-10-CM

## 2019-03-21 DIAGNOSIS — R4020 Unspecified coma: Secondary | ICD-10-CM | POA: Diagnosis not present

## 2019-03-21 DIAGNOSIS — R531 Weakness: Secondary | ICD-10-CM | POA: Diagnosis not present

## 2019-03-21 DIAGNOSIS — E1122 Type 2 diabetes mellitus with diabetic chronic kidney disease: Secondary | ICD-10-CM | POA: Diagnosis not present

## 2019-03-21 DIAGNOSIS — Z87891 Personal history of nicotine dependence: Secondary | ICD-10-CM

## 2019-03-21 DIAGNOSIS — E782 Mixed hyperlipidemia: Secondary | ICD-10-CM | POA: Diagnosis present

## 2019-03-21 LAB — GLUCOSE, CAPILLARY: Glucose-Capillary: 230 mg/dL — ABNORMAL HIGH (ref 70–99)

## 2019-03-21 LAB — CBC WITH DIFFERENTIAL/PLATELET
Abs Immature Granulocytes: 0.11 10*3/uL — ABNORMAL HIGH (ref 0.00–0.07)
Basophils Absolute: 0 10*3/uL (ref 0.0–0.1)
Basophils Relative: 0 %
Eosinophils Absolute: 0 10*3/uL (ref 0.0–0.5)
Eosinophils Relative: 0 %
HCT: 36.8 % (ref 36.0–46.0)
Hemoglobin: 11.9 g/dL — ABNORMAL LOW (ref 12.0–15.0)
Immature Granulocytes: 1 %
Lymphocytes Relative: 3 %
Lymphs Abs: 0.4 10*3/uL — ABNORMAL LOW (ref 0.7–4.0)
MCH: 28.5 pg (ref 26.0–34.0)
MCHC: 32.3 g/dL (ref 30.0–36.0)
MCV: 88.2 fL (ref 80.0–100.0)
Monocytes Absolute: 1.3 10*3/uL — ABNORMAL HIGH (ref 0.1–1.0)
Monocytes Relative: 8 %
Neutro Abs: 14.2 10*3/uL — ABNORMAL HIGH (ref 1.7–7.7)
Neutrophils Relative %: 88 %
Platelets: 139 10*3/uL — ABNORMAL LOW (ref 150–400)
RBC: 4.17 MIL/uL (ref 3.87–5.11)
RDW: 16.4 % — ABNORMAL HIGH (ref 11.5–15.5)
WBC: 16 10*3/uL — ABNORMAL HIGH (ref 4.0–10.5)
nRBC: 0 % (ref 0.0–0.2)

## 2019-03-21 LAB — COMPREHENSIVE METABOLIC PANEL
ALT: 21 U/L (ref 0–44)
AST: 24 U/L (ref 15–41)
Albumin: 3.8 g/dL (ref 3.5–5.0)
Alkaline Phosphatase: 81 U/L (ref 38–126)
Anion gap: 14 (ref 5–15)
BUN: 40 mg/dL — ABNORMAL HIGH (ref 8–23)
CO2: 25 mmol/L (ref 22–32)
Calcium: 9.3 mg/dL (ref 8.9–10.3)
Chloride: 100 mmol/L (ref 98–111)
Creatinine, Ser: 2.17 mg/dL — ABNORMAL HIGH (ref 0.44–1.00)
GFR calc Af Amer: 26 mL/min — ABNORMAL LOW (ref >60)
GFR calc non Af Amer: 22 mL/min — ABNORMAL LOW (ref >60)
Glucose, Bld: 232 mg/dL — ABNORMAL HIGH (ref 70–99)
Potassium: 4.3 mmol/L (ref 3.5–5.1)
Sodium: 139 mmol/L (ref 135–145)
Total Bilirubin: 1.3 mg/dL — ABNORMAL HIGH (ref 0.3–1.2)
Total Protein: 7.7 g/dL (ref 6.5–8.1)

## 2019-03-21 LAB — PROTIME-INR
INR: 1.2 (ref 0.8–1.2)
Prothrombin Time: 14.6 seconds (ref 11.4–15.2)

## 2019-03-21 LAB — URINALYSIS, ROUTINE W REFLEX MICROSCOPIC
Bilirubin Urine: NEGATIVE
Glucose, UA: NEGATIVE mg/dL
Hgb urine dipstick: NEGATIVE
Ketones, ur: NEGATIVE mg/dL
Leukocytes,Ua: NEGATIVE
Nitrite: NEGATIVE
Protein, ur: NEGATIVE mg/dL
Specific Gravity, Urine: 1.013 (ref 1.005–1.030)
pH: 5 (ref 5.0–8.0)

## 2019-03-21 LAB — PROCALCITONIN: Procalcitonin: 0.77 ng/mL

## 2019-03-21 LAB — POC SARS CORONAVIRUS 2 AG: SARS Coronavirus 2 Ag: NEGATIVE

## 2019-03-21 LAB — LACTIC ACID, PLASMA
Lactic Acid, Venous: 1.5 mmol/L (ref 0.5–1.9)
Lactic Acid, Venous: 1.3 mmol/L (ref 0.5–1.9)

## 2019-03-21 LAB — APTT: aPTT: 42 seconds — ABNORMAL HIGH (ref 24–36)

## 2019-03-21 MED ORDER — SODIUM CHLORIDE 0.9 % IV SOLN
INTRAVENOUS | Status: DC
  Administered 2019-03-21 – 2019-03-23 (×3): via INTRAVENOUS

## 2019-03-21 MED ORDER — INSULIN DETEMIR 100 UNIT/ML ~~LOC~~ SOLN
50.0000 [IU] | Freq: Every day | SUBCUTANEOUS | Status: DC
  Administered 2019-03-22 (×2): 50 [IU] via SUBCUTANEOUS
  Filled 2019-03-21 (×4): qty 0.5

## 2019-03-21 MED ORDER — METRONIDAZOLE IN NACL 5-0.79 MG/ML-% IV SOLN
500.0000 mg | Freq: Once | INTRAVENOUS | Status: AC
  Administered 2019-03-21: 15:00:00 500 mg via INTRAVENOUS
  Filled 2019-03-21: qty 100

## 2019-03-21 MED ORDER — ACETAMINOPHEN 500 MG PO TABS
1000.0000 mg | ORAL_TABLET | Freq: Once | ORAL | Status: AC
  Administered 2019-03-21: 1000 mg via ORAL
  Filled 2019-03-21: qty 2

## 2019-03-21 MED ORDER — LACTATED RINGERS IV BOLUS
750.0000 mL | Freq: Once | INTRAVENOUS | Status: AC
  Administered 2019-03-21: 750 mL via INTRAVENOUS

## 2019-03-21 MED ORDER — ASPIRIN EC 81 MG PO TBEC
81.0000 mg | DELAYED_RELEASE_TABLET | Freq: Every day | ORAL | Status: DC
  Administered 2019-03-22 – 2019-03-25 (×5): 81 mg via ORAL
  Filled 2019-03-21 (×5): qty 1

## 2019-03-21 MED ORDER — SODIUM CHLORIDE 0.9 % IV SOLN
2.0000 g | Freq: Once | INTRAVENOUS | Status: AC
  Administered 2019-03-21: 2 g via INTRAVENOUS
  Filled 2019-03-21: qty 2

## 2019-03-21 MED ORDER — ROSUVASTATIN CALCIUM 10 MG PO TABS
10.0000 mg | ORAL_TABLET | Freq: Every day | ORAL | Status: DC
  Administered 2019-03-22 – 2019-03-24 (×3): 10 mg via ORAL
  Filled 2019-03-21 (×3): qty 1

## 2019-03-21 MED ORDER — SODIUM CHLORIDE 0.9 % IV BOLUS
1000.0000 mL | Freq: Once | INTRAVENOUS | Status: AC
  Administered 2019-03-21: 1000 mL via INTRAVENOUS

## 2019-03-21 MED ORDER — FERROUS GLUCONATE 324 (38 FE) MG PO TABS
324.0000 mg | ORAL_TABLET | Freq: Two times a day (BID) | ORAL | Status: DC
  Administered 2019-03-22 – 2019-03-25 (×8): 324 mg via ORAL
  Filled 2019-03-21 (×9): qty 1

## 2019-03-21 MED ORDER — HYDROCORTISONE 1 % EX OINT
TOPICAL_OINTMENT | Freq: Two times a day (BID) | CUTANEOUS | Status: DC
  Administered 2019-03-22 (×2): via TOPICAL
  Administered 2019-03-22: 1 via TOPICAL
  Administered 2019-03-23 – 2019-03-24 (×4): via TOPICAL
  Administered 2019-03-25: 1 via TOPICAL
  Filled 2019-03-21: qty 28.35

## 2019-03-21 MED ORDER — INSULIN ASPART 100 UNIT/ML ~~LOC~~ SOLN
0.0000 [IU] | Freq: Three times a day (TID) | SUBCUTANEOUS | Status: DC
  Administered 2019-03-22 – 2019-03-23 (×4): 2 [IU] via SUBCUTANEOUS
  Administered 2019-03-25 (×2): 3 [IU] via SUBCUTANEOUS
  Filled 2019-03-21 (×6): qty 1

## 2019-03-21 MED ORDER — TRAMADOL-ACETAMINOPHEN 37.5-325 MG PO TABS
1.0000 | ORAL_TABLET | Freq: Two times a day (BID) | ORAL | Status: DC | PRN
  Administered 2019-03-22: 1 via ORAL
  Filled 2019-03-21: qty 1

## 2019-03-21 MED ORDER — SODIUM CHLORIDE 0.9 % IV BOLUS
1000.0000 mL | Freq: Once | INTRAVENOUS | Status: AC
  Administered 2019-03-21: 15:00:00 1000 mL via INTRAVENOUS

## 2019-03-21 MED ORDER — VERAPAMIL HCL 80 MG PO TABS
80.0000 mg | ORAL_TABLET | Freq: Two times a day (BID) | ORAL | Status: DC
  Administered 2019-03-22 – 2019-03-25 (×8): 80 mg via ORAL
  Filled 2019-03-21 (×9): qty 1

## 2019-03-21 MED ORDER — PREGABALIN 50 MG PO CAPS
100.0000 mg | ORAL_CAPSULE | Freq: Two times a day (BID) | ORAL | Status: DC
  Administered 2019-03-22 – 2019-03-25 (×8): 100 mg via ORAL
  Filled 2019-03-21 (×8): qty 2

## 2019-03-21 MED ORDER — PANTOPRAZOLE SODIUM 40 MG PO TBEC
40.0000 mg | DELAYED_RELEASE_TABLET | Freq: Every day | ORAL | Status: DC
  Administered 2019-03-22 – 2019-03-25 (×5): 40 mg via ORAL
  Filled 2019-03-21 (×5): qty 1

## 2019-03-21 MED ORDER — VANCOMYCIN HCL IN DEXTROSE 1-5 GM/200ML-% IV SOLN
1000.0000 mg | Freq: Once | INTRAVENOUS | Status: AC
  Administered 2019-03-21: 16:00:00 1000 mg via INTRAVENOUS
  Filled 2019-03-21: qty 200

## 2019-03-21 MED ORDER — HEPARIN SODIUM (PORCINE) 5000 UNIT/ML IJ SOLN
5000.0000 [IU] | Freq: Three times a day (TID) | INTRAMUSCULAR | Status: DC
  Administered 2019-03-22 – 2019-03-25 (×10): 5000 [IU] via SUBCUTANEOUS
  Filled 2019-03-21 (×10): qty 1

## 2019-03-21 MED ORDER — NYSTATIN 100000 UNIT/GM EX CREA
TOPICAL_CREAM | Freq: Two times a day (BID) | CUTANEOUS | Status: DC
  Administered 2019-03-22 (×2): via TOPICAL
  Administered 2019-03-22: 1 via TOPICAL
  Administered 2019-03-23 – 2019-03-24 (×4): via TOPICAL
  Administered 2019-03-25: 1 via TOPICAL
  Filled 2019-03-21: qty 15

## 2019-03-21 NOTE — Progress Notes (Signed)
Notified provider of need to order fluid bolus for MAP less than 60 twice. He stated that he would re-evaluate.

## 2019-03-21 NOTE — ED Triage Notes (Signed)
Pt arrives via [facility] from home for "unresponsive" per husband. Husband told EMS that pt was difficult to wake up this morning. CBG 164. HR 120's with EMS. Temp of 102.3 with EMS. Pt is alert but confused to year. Pt is receiving radiation to R breast, last treatment Friday. Insulin dependent. Arrives with bruises to abd, pt states is from her insulin. Redness to R breast.

## 2019-03-21 NOTE — ED Provider Notes (Signed)
[facility] Emergency Department Provider Note  ____________________________________________   First MD Initiated Contact with Patient 03/21/19 1429     (approximate)  I have reviewed the triage vital signs and the nursing notes.   HISTORY  Chief Complaint Fever and Weakness    HPI Kara [name] is a 73 y.o. female with breast cancer undergoing radiation treatment to her right breast who comes in with increasing fatigue.  Patient was less responsive this morning on EMS arrival patient was found to be febrile tachycardic to the 130s.  Patient is alert and oriented x3 at this time however just feels extremely weak that is severe, constant, nothing makes better, nothing makes it worse.  She has bruises on her abdomen that she is from her insulin shots.  She denies any abdominal pain.  No urinary symptoms that she knows of.  No cough or shortness of breath.          Past Medical History:  Diagnosis Date  . Anemia   . Arthropathy   . Benign neoplasm of breast   . Chest pain   . Chronic kidney disease    30% kidney function  . Diabetes ([place])   . Diabetic retinopathy ([place])   . Dyspnea   . Edema   . History of kidney stones   . Hyperlipemia   . Hypersomnia with sleep apnea   . Hypertension   . 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[place])   . Lumbago   . Osteoarthrosis   . Ovarian failure   . SOB (shortness of breath)     Patient Active Problem List   Diagnosis Date Noted  . Type 2 diabetes mellitus with hyperglycemia ([place]) 01/07/2019  . Ductal carcinoma in situ (DCIS) of right breast 01/01/2019  . Screening for breast cancer 10/03/2018  . Dyspnea on exertion 10/03/2018  . Dysuria 10/03/2018  . Type 2 diabetes mellitus with peripheral neuropathy ([place]) 06/29/2018  . Type 2 diabetes mellitus with diabetic neuropathy ([place]) 06/29/2018  . Uncontrolled type 2 diabetes mellitus with hyperglycemia ([place]) 03/30/2018  . Inflammatory polyarthropathy ([place])  03/30/2018  . Benign essential tremor 03/30/2018  . Irritable bowel syndrome with diarrhea 12/07/2017  . Essential hypertension 12/07/2017  . Mixed hyperlipidemia 12/07/2017  . Diabetes mellitus without complication ([place]) 70/48/8891    Past Surgical History:  Procedure Laterality Date  . BILATERAL SALPINGECTOMY    . BREAST BIOPSY Right 2014   neg- core  . BREAST BIOPSY Right 12/21/2018   [place] bx, venus clip,  DUCTAL CARCINOMA IN SITU  . BREAST BIOPSY Right 12/21/2018   [place] bx, ribbon clip,  FIBROEPITHELIAL PROLIFERATION WITH SCLEROSIS  . BREAST BIOPSY Right 01/01/2019   Affirm bx "X" clip-path pending  . BREAST DUCTAL SYSTEM EXCISION    . BREAST EXCISIONAL BIOPSY Right 2014   neg  . BREAST LUMPECTOMY Right 01/22/2019   path pending  . CATARACT EXTRACTION    . CHOLECYSTECTOMY    . CYSTOSCOPY WITH STENT PLACEMENT Left 04/14/2015   Procedure: CYSTOSCOPY WITH STENT PLACEMENT;  Surgeon: Hollice Espy, MD;  Location: [facility];  Service: Urology;  Laterality: Left;  . EXTRACORPOREAL SHOCK WAVE LITHOTRIPSY Left 09/29/2014   Procedure: EXTRACORPOREAL SHOCK WAVE LITHOTRIPSY (ESWL);  Surgeon: Hollice Espy, MD;  Location: [facility] OR[facility];  Service: Urology;  Laterality: Left;  . EYE SURGERY     bilateral cataract  . LAPAROSCOPIC OOPHERECTOMY Left   . PARTIAL MASTECTOMY WITH NEEDLE LOCALIZATION Right 01/22/2019   Procedure: PARTIAL MASTECTOMY WITH NEEDLE LOCALIZATION;  Surgeon: Herbert Pun, MD;  Location: [facility];  Service: General;  Laterality: Right;  . TONSILLECTOMY    . TUBAL LIGATION    . URETEROSCOPY WITH HOLMIUM LASER LITHOTRIPSY Left 04/14/2015   Procedure: URETEROSCOPY WITH HOLMIUM LASER LITHOTRIPSY;  Surgeon: Hollice Espy, MD;  Location: [facility];  Service: Urology;  Laterality: Left;    Prior to Admission medications   Medication Sig Start Date End Date Taking? Authorizing Provider  Aflibercept Alfonse Flavors) 2 MG/0.05ML SOLN     [provider]  APPLE CIDER VINEGAR PO  Take 1 tablet by mouth daily.    [provider]  aspirin EC 81 MG tablet Take 81 mg by mouth daily.     [provider]  cetirizine (ZYRTEC) 10 MG tablet Take 10 mg by mouth daily as needed for allergies.     [provider]  cholecalciferol (VITAMIN D) 1000 UNITS tablet Take 1,000 Units by mouth daily.     [provider]  Ferrous Gluconate 324 (37.5 Fe) MG TABS Take 324 mg by mouth 2 (two) times daily.     [provider]  furosemide (LASIX) 20 MG tablet Take 20 mg by mouth 2 (two) times daily as needed. 07/20/17   [provider]  glimepiride (AMARYL) 4 MG tablet Take 1 tablet (4 mg total) by mouth 2 (two) times daily with a meal. 10/01/18   Boscia, Greer Ee, NP  Glucos-Chond-Hyal Ac-Ca Fructo (MOVE FREE JOINT HEALTH ADVANCE) TABS Take 1 tablet by mouth daily.    [provider]  Insulin Detemir (LEVEMIR FLEXTOUCH) 100 UNIT/ML Pen Inject 75 Units into the skin daily after supper. 03/15/19   Ronnell Freshwater, NP  lisinopril (PRINIVIL,ZESTRIL) 20 MG tablet Take 20 mg by mouth daily.    [provider]  loperamide (IMODIUM A-D) 2 MG tablet Take 2 mg by mouth as needed for diarrhea or loose stools.    [provider]  MISC NATURAL PRODUCTS ER PO Take 1 capsule by mouth 2 (two) times daily. Blood sugar support    [provider]  Multiple Vitamin (MULTIVITAMIN) tablet Take 1 tablet by mouth daily at 12 noon.    [provider]  pantoprazole (PROTONIX) 40 MG tablet TAKE 1 TABLET(S) BY MOUTH DAILY 01/18/19   Ronnell Freshwater, NP  pregabalin (LYRICA) 100 MG capsule Take 1 capsule (100 mg total) by mouth 3 (three) times daily. Patient taking differently: Take 100 mg by mouth 2 (two) times daily.  12/21/18   Ronnell Freshwater, NP  rosuvastatin (CRESTOR) 20 MG tablet TAKE 1 TABLET BY MOUTH EVERYDAY AT BEDTIME 03/15/19   Ronnell Freshwater, NP  traMADol-acetaminophen (ULTRACET) 37.5-325 MG tablet Take 1 tablet  by mouth 2 (two) times daily as needed for moderate pain. 10/01/18   Boscia, Greer Ee, NP  TRULICITY 1.5 GY/5.6LS SOPN Inject 1.58m [place] once weekly Patient taking differently: Inject 1.5 mg into the skin every 7 (seven) days.  01/07/19   BRonnell Freshwater NP  verapamil (CALAN) 40 MG tablet TAKE 1 TABLET(S) BY MOUTH TWICE A DAY FOR HYPERTENSION Patient taking differently: Take 40 mg by mouth 2 (two) times daily.  12/08/18   BRonnell Freshwater NP    Allergies Patient has no known allergies.  Family History  Problem Relation Age of Onset  . Breast cancer Maternal Grandmother 838 . Colon cancer Mother   . Lung cancer Father     Social History Social History   Tobacco Use  . Smoking  status: Former Smoker    Packs/day: 1.00    Years: 30.00    Pack years: 30.00    Quit date: 04/11/1995    Years since quitting: 23.9  . Smokeless tobacco: Never Used  Substance Use Topics  . Alcohol use: No    Alcohol/week: 0.0 standard drinks  . Drug use: No      Review of Systems Constitutional: Positive fever and fatigue Eyes: No visual changes. ENT: No sore throat. Cardiovascular: Denies chest pain. Respiratory: Denies shortness of breath. Gastrointestinal: No abdominal pain.  No nausea, no vomiting.  No diarrhea.  No constipation. Genitourinary: Negative for dysuria. Musculoskeletal: Negative for back pain. Skin: Negative for rash. Neurological: Negative for headaches, focal weakness or numbness. All other ROS negative ____________________________________________   PHYSICAL EXAM:  VITAL SIGNS: Blood pressure 132/64, pulse (!) 111, temperature (!) 102.3 F (39.1 C), temperature source Oral, resp. rate (!) 23, height 5' 3"  (1.6 m), weight 92.1 kg, SpO2 97 %.  Constitutional: Alert and oriented.  Appears fatigued in nature. Eyes: Conjunctivae are normal. EOMI. Head: Atraumatic. Nose: No congestion/rhinnorhea. Mouth/Throat: Mucous membranes are moist.   Neck: No stridor. Trachea  Midline. FROM Cardiovascular: Normal rate, regular rhythm. Grossly normal heart sounds.  Good peripheral circulation. Respiratory: Normal respiratory effort.  No retractions. Lungs CTAB. Gastrointestinal: Soft and nontender. No distention. No abdominal bruits.  Bruising on her abdomen that patient states is from her insulin. Musculoskeletal: No lower extremity tenderness nor edema.  No joint effusions. Neurologic:  Normal speech and language. No gross focal neurologic deficits are appreciated.  Skin:  Skin is warm, dry and intact. No rash noted. Psychiatric: Mood and affect are normal. Speech and behavior are normal. GU: Deferred   ____________________________________________   LABS (all labs ordered are listed, but only abnormal results are displayed)  Labs Reviewed  COMPREHENSIVE METABOLIC PANEL - Abnormal; Notable for the following components:      Result Value   Glucose, Bld 232 (*)    BUN 40 (*)    Creatinine, Ser 2.17 (*)    Total Bilirubin 1.3 (*)    GFR calc non Af Amer 22 (*)    GFR calc Af Amer 26 (*)    All other components within normal limits  CBC WITH DIFFERENTIAL/PLATELET - Abnormal; Notable for the following components:   WBC 16.0 (*)    Hemoglobin 11.9 (*)    RDW 16.4 (*)    Platelets 139 (*)    Neutro Abs 14.2 (*)    Lymphs Abs 0.4 (*)    Monocytes Absolute 1.3 (*)    Abs Immature Granulocytes 0.11 (*)    All other components within normal limits  APTT - Abnormal; Notable for the following components:   aPTT 42 (*)    All other components within normal limits  CULTURE, BLOOD (ROUTINE X 2)  CULTURE, BLOOD (ROUTINE X 2)  URINE CULTURE  SARS CORONAVIRUS 2 (TAT 6-24 HRS)  LACTIC ACID, PLASMA  PROTIME-INR  PROCALCITONIN  LACTIC ACID, PLASMA  URINALYSIS, ROUTINE W REFLEX MICROSCOPIC  POC SARS CORONAVIRUS 2 AG -  ED  POC SARS CORONAVIRUS 2 AG   ____________________________________________   ED ECG REPORT I, Vanessa Prichard, the attending physician,  personally viewed and interpreted this ECG.  KG sinus tachycardia rate of 123, no ST elevations, no T wave inversions, normal intervals ____________________________________________  RADIOLOGY Robert Bellow, personally viewed and evaluated these images (plain radiographs) as part of my medical decision making, as well as reviewing the  written report by the radiologist.  ED MD interpretation:  No PNA  Official radiology report(s): Dg Chest Port 1 View  Result Date: 03/21/2019 CLINICAL DATA:  Fever EXAM: PORTABLE CHEST 1 VIEW COMPARISON:  None. FINDINGS: No edema or consolidation. Heart is borderline enlarged with pulmonary vascularity normal. No adenopathy. There is aortic atherosclerosis. There is degenerative change in the left shoulder. IMPRESSION: No edema or consolidation. Borderline cardiac enlargement. No adenopathy. Aortic Atherosclerosis (ICD10-I70.0). Electronically Signed   By: Lowella Grip III M.D.   On: 03/21/2019 15:00    ____________________________________________   PROCEDURES  Procedure(s) performed (including Critical Care):  .Critical Care Performed by: Vanessa Delleker, MD Authorized by: Vanessa Rupert, MD   Critical care provider statement:    Critical care time (minutes):  35   Critical care was necessary to treat or prevent imminent or life-threatening deterioration of the following conditions:  Sepsis   Critical care was time spent personally by me on the following activities:  Discussions with consultants, evaluation of patient's response to treatment, examination of patient, ordering and performing treatments and interventions, ordering and review of laboratory studies, ordering and review of radiographic studies, pulse oximetry, re-evaluation of patient's condition, obtaining history from patient or surrogate and review of old charts     ____________________________________________   INITIAL IMPRESSION / ASSESSMENT AND PLAN / ED COURSE  Kara [name] was evaluated in Emergency Department on 03/21/2019 for the symptoms described in the history of present illness. She was evaluated in the context of the global COVID-19 pandemic, which necessitated consideration that the patient might be at risk for infection with the SARS-CoV-2 virus that causes COVID-19. Institutional protocols and algorithms that pertain to the evaluation of patients at risk for COVID-19 are in a state of rapid change based on information released by regulatory bodies including the CDC and federal and state organizations. These policies and algorithms were followed during the patient's care in the ED.    Patient met sepsis criteria.  Given patient is a breast cancer patient will start broad-spectrum antibiotics.  Will get blood cultures, x-ray evaluate for pneumonia, urine evaluate for UTI, coronavirus testing.  No abdominal pain to suggest abdominal infection.  Patient does not appear appear meningitic.  Patient handed off to oncoming team pending results and admission   ____________________________________________   FINAL CLINICAL IMPRESSION(S) / ED DIAGNOSES   Final diagnoses:  Sepsis, due to unspecified organism, unspecified whether acute organ dysfunction present [facility])      MEDICATIONS GIVEN DURING THIS VISIT:  Medications  vancomycin (VANCOCIN) IVPB 1000 mg/200 mL premix (1,000 mg Intravenous New Bag/Given 03/21/19 1542)  ceFEPIme (MAXIPIME) 2 g in sodium chloride 0.9 % 100 mL IVPB (0 g Intravenous Stopped 03/21/19 1540)  metroNIDAZOLE (FLAGYL) IVPB 500 mg (0 mg Intravenous Stopped 03/21/19 1612)  acetaminophen (TYLENOL) tablet 1,000 mg (1,000 mg Oral Given 03/21/19 1510)  sodium chloride 0.9 % bolus 1,000 mL (0 mLs Intravenous Stopped 03/21/19 1540)     ED Discharge Orders    None       Note:  This document was prepared using Dragon voice recognition software and may include unintentional dictation errors.   Vanessa [place], MD 03/21/19 [phone]

## 2019-03-21 NOTE — ED Notes (Signed)
Pt provided phone to speak with husband.

## 2019-03-21 NOTE — ED Notes (Signed)
Placed a brief on pt and chuck underneath pt. Pt was turned from side to side after being cleaned with wipes.

## 2019-03-21 NOTE — ED Provider Notes (Signed)
-----------------------------------------   3:00 PM on 03/21/2019 -----------------------------------------  Blood pressure 133/71, pulse (!) 121, temperature (!) 102.3 F (39.1 C), temperature source Oral, resp. rate (!) 31, height 5\' 3"  (1.6 m), weight 92.1 kg, SpO2 98 %.  Assuming care from Dr. Jari Pigg.  In short, Kara [name] is a 73 y.o. female with a chief complaint of Fever and Weakness .  Refer to the original H&P for additional details.  The current plan of care is to admit for further sepsis workup, no clear source at this time.  Chest x-ray clear and urinalysis negative for acute infection.  Patient's AGTXM-46 testing is pending.  Abdomen reexamined and is soft and nontender, no neck stiffness or headache to suggest meningitis.  Patient's heart rate is improved and she is now normotensive.  Case discussed with hospitalist, who accepts patient for admission.    Blake Divine, MD 03/21/19 [phone]

## 2019-03-21 NOTE — ED Notes (Signed)
Verbal order from Dr. Charna Archer to in and out cath pt d/t not having urinating using purewick.

## 2019-03-21 NOTE — Progress Notes (Signed)
CODE SEPSIS - PHARMACY COMMUNICATION  **Broad Spectrum Antibiotics should be administered within 1 hour of Sepsis diagnosis**  Time Code Sepsis Called/Page Received: 1431  Antibiotics Ordered: flagyl,cefepime,vancomycin  Time of 1st antibiotic administration: 1500  Additional action taken by pharmacy: none  If necessary, Name of Provider/Nurse Contacted: n/a    Pearla Dubonnet ,PharmD Clinical Pharmacist  03/21/2019  3:19 PM

## 2019-03-21 NOTE — H&P (Addendum)
History and Physical    Kara [name]:[id number] DOB: 1945-08-05 DOA: 03/21/2019  PCP: Lavera Guise, MD  Patient coming from: Home  I have personally briefly reviewed patient's old medical records in [place]  Chief Complaint: generalized weakness and confusion  HPI: Kara [name] is a 73 y.o. female with medical history significant of right breast ductal carcinoma in situ s/p right lumpectomy and undergoing radiation, hypertension, type 2 diabetes, chronic kidney disease stage IV, tremor, irritable bowel syndrome who presents with concerns of generalized weakness and confusion.  Patient reports that she was otherwise in her normal state of health yesterday but then this morning she woke up and her husband was unable to get her out of bed.  She reports feeling very weak and having increased tremors of her bilateral hands.  She also remembers feeling confused but otherwise cannot recall much of how she got to the hospital.  She denies any fevers or chills.  Denies any new cough, runny nose.  Endorses a sore throat but pointed to her anterior right cervical region and states it is worse when she palpates it.  Denies any chest pain, shortness of breath.  No nausea, vomiting or diarrhea.  No abdominal pain.  She endorsed feeling some pressure to her suprapubic region but denies any dysuria, increase frequency or urgency. Her last radiation treatment was 4 days ago. She notes a new rash on her right anterior chest above her right breast that is pruritic and started about 5 days ago.Has also noted tingling radiating down bilateral lower extremity recently which feels different from her normal neuropathy.  Denies any tobacco, alcohol or illicit drug use.  ED Course: She was febrile with fever up to 102.3, tachycardic up to 120 and was normotensive on room air.  CBC shows leukocytosis up to 16 and mild anemia with hemoglobin of 11.9.  CMP showed glucose of 232, creatinine of 2.17.  Lactate of  1.5.  Procalcitonin of 0.77.  Chest x-ray shows no acute cardiorespiratory process.  Urinalysis was negative.  She was started on broad spectrum antibiotics with vancomycin, cefepime and flagyl and had 2.75L of fluid resuscitation.   Review of Systems:  Constitutional: No Weight Change, no Fever ENT/Mouth: + sore throat, No Rhinorrhea Eyes: No Eye Pain, No Vision Changes Cardiovascular: No Chest Pain, no SOB Respiratory: No Cough, No Sputum, No Wheezing, no Dyspnea  Gastrointestinal: No Nausea, No Vomiting, No Diarrhea, No Constipation, No Pain Genitourinary: no Urinary Incontinence, No Urgency, No Flank Pain Musculoskeletal: No Arthralgias, No Myalgias Skin: No Skin Lesions, No Pruritus, Neuro: + Weakness, No Numbness,  No Loss of Consciousness, No Syncope Psych: No Anxiety/Panic, No Depression, no decrease appetite Heme/Lymph: No Bruising, No Bleeding  Past Medical History:  Diagnosis Date  . Anemia   . Arthropathy   . Benign neoplasm of breast   . Chest pain   . Chronic kidney disease    30% kidney function  . Diabetes ([place])   . Diabetic retinopathy ([place])   . Dyspnea   . Edema   . History of kidney stones   . Hyperlipemia   . Hypersomnia with sleep apnea   . Hypertension   . 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 Lumbago   . Osteoarthrosis   . Ovarian failure   . SOB (shortness of breath)     Past Surgical History:  Procedure Laterality Date  . BILATERAL SALPINGECTOMY    . BREAST BIOPSY Right 2014   neg-  core  . BREAST BIOPSY Right 12/21/2018   [place] bx, venus clip,  DUCTAL CARCINOMA IN SITU  . BREAST BIOPSY Right 12/21/2018   [place] bx, ribbon clip,  FIBROEPITHELIAL PROLIFERATION WITH SCLEROSIS  . BREAST BIOPSY Right 01/01/2019   Affirm bx "X" clip-path pending  . BREAST DUCTAL SYSTEM EXCISION    . BREAST EXCISIONAL BIOPSY Right 2014   neg  . BREAST LUMPECTOMY Right 01/22/2019   path pending  . CATARACT EXTRACTION    . CHOLECYSTECTOMY    . CYSTOSCOPY WITH  STENT PLACEMENT Left 04/14/2015   Procedure: CYSTOSCOPY WITH STENT PLACEMENT;  Surgeon: Hollice Espy, MD;  Location: [facility] ORS;  Service: Urology;  Laterality: Left;  . EXTRACORPOREAL SHOCK WAVE LITHOTRIPSY Left 09/29/2014   Procedure: EXTRACORPOREAL SHOCK WAVE LITHOTRIPSY (ESWL);  Surgeon: Hollice Espy, MD;  Location: [facility] ORS;  Service: Urology;  Laterality: Left;  . EYE SURGERY     bilateral cataract  . LAPAROSCOPIC OOPHERECTOMY Left   . PARTIAL MASTECTOMY WITH NEEDLE LOCALIZATION Right 01/22/2019   Procedure: PARTIAL MASTECTOMY WITH NEEDLE LOCALIZATION;  Surgeon: Herbert Pun, MD;  Location: [facility] ORS;  Service: General;  Laterality: Right;  . TONSILLECTOMY    . TUBAL LIGATION    . URETEROSCOPY WITH HOLMIUM LASER LITHOTRIPSY Left 04/14/2015   Procedure: URETEROSCOPY WITH HOLMIUM LASER LITHOTRIPSY;  Surgeon: Hollice Espy, MD;  Location: [facility] OR[facility];  Service: Urology;  Laterality: Left;     reports that she quit smoking about 23 years ago. She has a 30.00 pack-year smoking history. She has never used smokeless tobacco. She reports that she does not drink alcohol or use drugs.  No Known Allergies  Family History  Problem Relation Age of Onset  . Breast cancer Maternal Grandmother 37  . Colon cancer Mother   . Lung cancer Father      Prior to Admission medications   Medication Sig Start Date End Date Taking? Authorizing Provider  APPLE CIDER VINEGAR PO Take 2 tablets by mouth daily.    Yes [provider]  aspirin EC 81 MG tablet Take 81 mg by mouth daily.    Yes [provider]  cholecalciferol (VITAMIN D) 1000 UNITS tablet Take 2,000 Units by mouth daily.    Yes [provider]  Ferrous Gluconate 324 (37.5 Fe) MG TABS Take 324 mg by mouth 2 (two) times daily.    Yes [provider]  furosemide (LASIX) 20 MG tablet Take 20 mg by mouth 2 (two) times daily as needed. 07/20/17  Yes [provider]  glimepiride (AMARYL) 4 MG tablet  Take 1 tablet (4 mg total) by mouth 2 (two) times daily with a meal. Patient taking differently: Take 4 mg by mouth 2 (two) times daily.  10/01/18  Yes Boscia, Greer Ee, NP  Insulin Detemir (LEVEMIR FLEXTOUCH) 100 UNIT/ML Pen Inject 75 Units into the skin daily after supper. 03/15/19  Yes Boscia, Heather E, NP  lisinopril (ZESTRIL) 40 MG tablet Take 40 mg by mouth daily.    Yes [provider]  Multiple Vitamin (MULTIVITAMIN) tablet Take 1 tablet by mouth daily at 12 noon.   Yes [provider]  pantoprazole (PROTONIX) 40 MG tablet TAKE 1 TABLET(S) BY MOUTH DAILY 01/18/19  Yes Boscia, Heather E, NP  pregabalin (LYRICA) 100 MG capsule Take 1 capsule (100 mg total) by mouth 3 (three) times daily. Patient taking differently: Take 100 mg by mouth 2 (two) times daily.  12/21/18  Yes Ronnell Freshwater, NP  rosuvastatin (CRESTOR) 20 MG tablet  TAKE 1 TABLET BY MOUTH EVERYDAY AT BEDTIME 03/15/19  Yes Boscia, Heather E, NP  traMADol-acetaminophen (ULTRACET) 37.5-325 MG tablet Take 1 tablet by mouth 2 (two) times daily as needed for moderate pain. 10/01/18  Yes Boscia, Heather E, NP  TRULICITY 1.5 FH/5.4TG SOPN Inject 1.5mg  [place] once weekly Patient taking differently: Inject 1.5 mg into the skin every 7 (seven) days.  01/07/19  Yes Boscia, Heather E, NP  verapamil (CALAN) 40 MG tablet TAKE 1 TABLET(S) BY MOUTH TWICE A DAY FOR HYPERTENSION Patient taking differently: Take 80 mg by mouth 2 (two) times daily.  12/08/18  Yes Ronnell Freshwater, NP    Physical Exam: Vitals:   03/21/19 1730 03/21/19 1830 03/21/19 1855 03/21/19 1900  BP: (!) 100/45 117/60  119/61  Pulse: 89 87  82  Resp: (!) 21 (!) 21  20  Temp:   100 F (37.8 C)   TempSrc:   Oral   SpO2: 92% 96%  94%  Weight:      Height:        Constitutional: NAD, calm, comfortable, non-toxic appearing female laying flat in bed Vitals:   03/21/19 1730 03/21/19 1830 03/21/19 1855 03/21/19 1900  BP: (!) 100/45 117/60  119/61  Pulse: 89 87   82  Resp: (!) 21 (!) 21  20  Temp:   100 F (37.8 C)   TempSrc:   Oral   SpO2: 92% 96%  94%  Weight:      Height:       Eyes: PERRL, lids and conjunctivae normal ENMT: Mucous membranes are moist. Posterior pharynx clear of any exudate or lesions.Normal dentition.  Neck: normal, supple, bilateral anterior lymphadenopathy  respiratory: clear to auscultation bilaterally, no wheezing, no crackles. Normal respiratory effort on room air. No accessory muscle use.  Cardiovascular: Regular rate and rhythm, no murmurs / rubs / gallops. No extremity edema. 2+ pedal pulses.  Abdomen: no tenderness, no masses palpated.Bowel sounds positive.  Musculoskeletal: no clubbing / cyanosis. No joint deformity upper and lower extremities.  no contractures. Normal muscle tone.  Skin: Numerous macular erythematous rash on right anterior chest above the right breast with noted excoriation and scabs. Mild erythema along the pannus of the lower abdomen.  Neurologic: CN 2-12 grossly intact. Sensation intact. Strength 5/5 in all 4.  Psychiatric: Normal judgment and insight. Alert and oriented x 3. Normal mood.     Labs on Admission: I have personally reviewed following labs and imaging studies  CBC: Recent Labs  Lab 03/21/19 1450  WBC 16.0*  NEUTROABS 14.2*  HGB 11.9*  HCT 36.8  MCV 88.2  PLT 256*   Basic Metabolic Panel: Recent Labs  Lab 03/21/19 1450  NA 139  K 4.3  CL 100  CO2 25  GLUCOSE 232*  BUN 40*  CREATININE 2.17*  CALCIUM 9.3   GFR: Estimated Creatinine Clearance: 25.3 mL/min (A) (by C-G formula based on SCr of 2.17 mg/dL (H)). Liver Function Tests: Recent Labs  Lab 03/21/19 1450  AST 24  ALT 21  ALKPHOS 81  BILITOT 1.3*  PROT 7.7  ALBUMIN 3.8   No results for input(s): LIPASE, AMYLASE in the last 168 hours. No results for input(s): AMMONIA in the last 168 hours. Coagulation Profile: Recent Labs  Lab 03/21/19 1450  INR 1.2   Cardiac Enzymes: No results for input(s):  CKTOTAL, CKMB, CKMBINDEX, TROPONINI in the last 168 hours. BNP (last 3 results) No results for input(s): PROBNP in the last 8760 hours. HbA1C: No results  for input(s): HGBA1C in the last 72 hours. CBG: No results for input(s): GLUCAP in the last 168 hours. Lipid Profile: No results for input(s): CHOL, HDL, LDLCALC, TRIG, CHOLHDL, LDLDIRECT in the last 72 hours. Thyroid Function Tests: No results for input(s): TSH, T4TOTAL, FREET4, T3FREE, THYROIDAB in the last 72 hours. Anemia Panel: No results for input(s): VITAMINB12, FOLATE, FERRITIN, TIBC, IRON, RETICCTPCT in the last 72 hours. Urine analysis:    Component Value Date/Time   COLORURINE YELLOW (A) 03/21/2019 1751   APPEARANCEUR CLEAR (A) 03/21/2019 1751   APPEARANCEUR Cloudy (A) 10/01/2018 0945   LABSPEC 1.013 03/21/2019 1751   PHURINE 5.0 03/21/2019 1751   GLUCOSEU NEGATIVE 03/21/2019 1751   HGBUR NEGATIVE 03/21/2019 1751   BILIRUBINUR NEGATIVE 03/21/2019 1751   BILIRUBINUR Negative 10/01/2018 0945   KETONESUR NEGATIVE 03/21/2019 1751   PROTEINUR NEGATIVE 03/21/2019 1751   NITRITE NEGATIVE 03/21/2019 1751   LEUKOCYTESUR NEGATIVE 03/21/2019 1751    Radiological Exams on Admission: Dg Chest Port 1 View  Result Date: 03/21/2019 CLINICAL DATA:  Fever EXAM: PORTABLE CHEST 1 VIEW COMPARISON:  None. FINDINGS: No edema or consolidation. Heart is borderline enlarged with pulmonary vascularity normal. No adenopathy. There is aortic atherosclerosis. There is degenerative change in the left shoulder. IMPRESSION: No edema or consolidation. Borderline cardiac enlargement. No adenopathy. Aortic Atherosclerosis (ICD10-I70.0). Electronically Signed   By: Lowella Grip III M.D.   On: 03/21/2019 15:00    EKG: Independently reviewed.   Assessment/Plan  Sepsis secondary to unknown cause - negative CXR and UA - POC COVID test negative, PCR COVID pending - obtain RVP -Continue vancomycin, cefepime and flagyl  - Blood and urine culture  pending  Acute on chronic kidney disease stage IV - Creatinine of 2.17 - follow BMP after fluid resuscitation  Candidal rash - mild infection in the Abdominal fold - Nystatin cream  Anterior chest rash - could be secondary to radiation but no source of infection - hydrocortisone cream   Bilateral lower extremity tingling - check Vitamin I14 and folic acid   Hypertension - Hold Lisinopril due to AKI - Continue verapamil   Type 2 diabetes with neuropathy - HbA1C of 7.5 IN 12/2018 - normally takes 75 units of Levermir after dinner  - Start with 50 units Levermir qHS and moderate SSI here - continue Lyrica  Ductal carcinoma in situ of the right breast - dx in August 2020 -s/p right lumpectomy and undergoing radiation - Last radiation tx reportedly on 11/25  GERD - continue PPI  Iron deficiency anemia - continue iron  DVT prophylaxis: Heparin Code Status:DNR Family Communication: Plan discussed with patient at bedside  disposition Plan: Home with at least 2 midnight stays  Consults called:  Admission status: inpatient   Ayahna Solazzo T Alazne Quant DO [facility]   If 7PM-7AM, please contact night-coverage [email or web address] Password [facility]  03/21/2019, 7:26 PM

## 2019-03-21 NOTE — Progress Notes (Signed)
PHARMACY -  BRIEF ANTIBIOTIC NOTE   Pharmacy has received consult(s) for Vancomycin and Cefepime from an ED provider.  The patient's profile has been reviewed for ht/wt/allergies/indication/available labs.    One time order(s) placed for Vancomycin 1g and Cefepime 2g x 1 dose each.  Further antibiotics/pharmacy consults should be ordered by admitting physician if indicated.                       Thank you, Pearla Dubonnet 03/21/2019  2:36 PM

## 2019-03-22 ENCOUNTER — Inpatient Hospital Stay: Payer: Medicare HMO

## 2019-03-22 ENCOUNTER — Ambulatory Visit: Payer: Medicare HMO

## 2019-03-22 ENCOUNTER — Other Ambulatory Visit: Payer: Self-pay

## 2019-03-22 DIAGNOSIS — R21 Rash and other nonspecific skin eruption: Secondary | ICD-10-CM

## 2019-03-22 DIAGNOSIS — D0511 Intraductal carcinoma in situ of right breast: Secondary | ICD-10-CM

## 2019-03-22 DIAGNOSIS — R509 Fever, unspecified: Secondary | ICD-10-CM

## 2019-03-22 DIAGNOSIS — I700 Atherosclerosis of aorta: Secondary | ICD-10-CM

## 2019-03-22 DIAGNOSIS — N184 Chronic kidney disease, stage 4 (severe): Secondary | ICD-10-CM

## 2019-03-22 DIAGNOSIS — N61 Mastitis without abscess: Secondary | ICD-10-CM

## 2019-03-22 LAB — GLUCOSE, CAPILLARY
Glucose-Capillary: 231 mg/dL — ABNORMAL HIGH (ref 70–99)
Glucose-Capillary: 149 mg/dL — ABNORMAL HIGH (ref 70–99)
Glucose-Capillary: 96 mg/dL (ref 70–99)
Glucose-Capillary: 123 mg/dL — ABNORMAL HIGH (ref 70–99)
Glucose-Capillary: 98 mg/dL (ref 70–99)

## 2019-03-22 LAB — SARS CORONAVIRUS 2 (TAT 6-24 HRS): SARS Coronavirus 2: NEGATIVE

## 2019-03-22 LAB — RESPIRATORY PANEL BY PCR

## 2019-03-22 LAB — HEMOGLOBIN A1C
Hgb A1c MFr Bld: 7.6 % — ABNORMAL HIGH (ref 4.8–5.6)
Mean Plasma Glucose: 171.42 mg/dL

## 2019-03-22 LAB — URINE CULTURE: Culture: NO GROWTH

## 2019-03-22 LAB — FOLATE: Folate: 35 ng/mL

## 2019-03-22 LAB — VITAMIN B12: Vitamin B-12: 725 pg/mL (ref 180–914)

## 2019-03-22 MED ORDER — VANCOMYCIN HCL IN DEXTROSE 1-5 GM/200ML-% IV SOLN
1000.0000 mg | Freq: Once | INTRAVENOUS | Status: AC
  Administered 2019-03-22: 1000 mg via INTRAVENOUS
  Filled 2019-03-22: qty 200

## 2019-03-22 MED ORDER — SODIUM CHLORIDE 0.9 % IV SOLN
2.0000 g | INTRAVENOUS | Status: DC
  Filled 2019-03-22: qty 2

## 2019-03-22 MED ORDER — SODIUM CHLORIDE 0.9 % IV SOLN
2.0000 g | Freq: Once | INTRAVENOUS | Status: AC
  Administered 2019-03-22: 2 g via INTRAVENOUS
  Filled 2019-03-22: qty 2

## 2019-03-22 MED ORDER — ACETAMINOPHEN 325 MG PO TABS
650.0000 mg | ORAL_TABLET | Freq: Four times a day (QID) | ORAL | Status: DC | PRN
  Administered 2019-03-22 (×2): 650 mg via ORAL
  Filled 2019-03-22 (×2): qty 2

## 2019-03-22 MED ORDER — METRONIDAZOLE IN NACL 5-0.79 MG/ML-% IV SOLN
500.0000 mg | Freq: Three times a day (TID) | INTRAVENOUS | Status: DC
  Administered 2019-03-22: 05:00:00 500 mg via INTRAVENOUS
  Filled 2019-03-22 (×3): qty 100

## 2019-03-22 MED ORDER — VANCOMYCIN VARIABLE DOSE PER UNSTABLE RENAL FUNCTION (PHARMACIST DOSING): Status: DC

## 2019-03-22 MED ORDER — IBUPROFEN 400 MG PO TABS: 400.0000 mg | ORAL_TABLET | Freq: Four times a day (QID) | ORAL | Status: DC | PRN

## 2019-03-22 NOTE — Progress Notes (Signed)
Pharmacy Antibiotic Note  Kara [name] is a 73 y.o. female admitted on 03/21/2019 with sepsis.  Pharmacy has been consulted for Vancomycin and Cefepime dosing.  Pt w/ elevated SCr  Plan: Vancomycin 1000mg  given in ED on 11/29, Vancomycin 1000mg  x 1 now for 2 gm total loading dose Cefepime 2000mg  given in ED on 11/29.  Cefepime 2gm now pending labs then evaluate for maintenance dose.  Calculate maintenance doses of ABX when labs reported later this morning.  Height: 5\' 3"  (160 cm) Weight: 223 lb 8.7 oz (101.4 kg) IBW/kg (Calculated) : 52.4  Temp (24hrs), Avg:100.4 F (38 C), Min:98 F (36.7 C), Max:102.3 F (39.1 C)  Recent Labs  Lab 03/21/19 1450 03/21/19 2012  WBC 16.0*  --   CREATININE 2.17*  --   LATICACIDVEN 1.5 1.3    Estimated Creatinine Clearance: 26.6 mL/min (A) (by C-G formula based on SCr of 2.17 mg/dL (H)).    No Known Allergies  Antimicrobials this admission: Cefepime 11/29 >>  Vancomycin 11/29 >>  Flagyl 11/29 >>  Dose adjustments this admission:   Microbiology results:  BCx:   UCx:    Sputum:    MRSA PCR:   Thank you for allowing pharmacy to be a part of this patient's care.  Hart Robinsons A 03/22/2019 4:58 AM

## 2019-03-22 NOTE — Progress Notes (Signed)
PROGRESS NOTE  Kara [name] XLK:[id number] DOB: 1946/01/17 DOA: 03/21/2019 PCP: Lavera Guise, MD  Brief History   73 year old woman PMH right breast ductal carcinoma in situ, status post right lumpectomy and undergoing radiation with last treatment approximately 4 days ago; hypertension, diabetes mellitus type 2, CKD stage IV, irritable bowel, tremor, presented with generalized weakness and confusion.  Husband reportedly unable to get her out of bed.  Confusion.  Sore throat with anterior right cervical region tenderness.  No nausea, vomiting or diarrhea, no chest pain or abdominal pain.  Reported rash right anterior chest above right breast that is pruritic, developed 5 days prior to presentation.  A & P  Febrile illness of unclear etiology but likely related to right breast infection/cellulitis.  Sepsis considered on admission but no evidence of new or endorgan dysfunction.  Lactic acid was within normal limits and creatinine appears to be at baseline. Procalcitonin 0.77.  Chest x-ray and urinalysis unremarkable.  PCR Covid test negative.   --RVP pending. --Continue empiric antibiotics vancomycin, cefepime pending culture data.  Stop Flagyl.  Do not suspect anaerobic infection at this time. --CBC in AM  Edematous, erythematous, warm right breast, suspected cellulitis.  In context of right breast ductal carcinoma in situ, status post right lumpectomy, currently undergoing radiation treatment; followed by Drs. Chrystal, Finnegan and Cintron Ferrel Logan --I been in touch with Dr. Baruch Gouty and Dr. Windell Moment, Dr. Windell Moment will assist the patient and provide further recommendations.  --continue abx  Macular erythematous rash right anterior chest above right breast, suspected to be related to radiation therapy. --Supportive care  Bilateral anterior lymphadenopathy with sore throat --Supportive care.  Diabetes mellitus type 2 with diabetic retinopathy, neuropathy, CKD.  On 75 units Levemir,  Trulicity, Amaryl, Lyrica as an outpatient. --stable, continue Levemir at lower dose for now, continue SSI; continue Lyrica. Hold Trulicity and Amaryl.   CKD stage IV followed by Dr. Candiss Norse, on lisinopril as an outpatient.  Baseline creatinine appears to be approximately 1.7-2.1. --appears to be at baseline --Hold lisinopril  Iron deficiency anemia, anemia of CKD --stable, Continue iron  Lumbago with bilateral lower extremity numbness, suspect related to diabetic neuropathy --Vitamin B12 within normal limits   Incidental finding aortic atherosclerosis by chest x-ray --continue Crestor.  Chart review . 02/04/2019 oncology office visit.  Adjuvant XRT recommended, followed by tamoxifen for 5 years.  Follow-up in December planned. . 03/01/2019 outpatient nephrology office visit for CKD stage IV.  Plan was to continue lisinopril.  Norepinephrine was increased to 80 mg twice daily. Marland Kitchen No previous hospitalizations  Resolved Hospital Problem list       DVT prophylaxis: heparin Code Status: DNR Family Communication: none Disposition Plan: home    Murray Hodgkins, MD  [facility] Direct contact: see [email or web address] (further directions at bottom of note if needed) 7PM-7AM contact night coverage as at bottom of note 03/22/2019, 2:32 PM  LOS: 1 day   Significant Hospital Events   . 11/29 presented with fever, confusion, generalized weakness.  Admitted for suspected sepsis, acute kidney injury, anterior chest rash   Consults:  .    Procedures:  .   Significant Diagnostic Tests:  . EKG shows sinus tachycardia with no acute changes . Chest x-ray 11/29, no acute disease   Micro Data:  . SARS-CoV-2 negative . Urinalysis negative . RVP pending . 11/29 blood cultures pending . 11/29 urine culture pending   Antimicrobials:  . Cefepime 11/29 > . Metronidazole 11/29 > . Vancomycin 11/29 >  Interval History/Subjective  T-max 102.6 Patient feels a lot better today.  She has been  able to spontaneously void after having Foley catheter removed earlier. She reports that she has been doing fairly well but developed right breast pain and rash in the last few days.  She has been undergoing radiation for some time and has not had previous problems with radiation. No difficulty breathing or significant cough.  No dysuria.  Objective   Vitals:  Vitals:   03/22/19 1156 03/22/19 1232  BP: (!) 126/50   Pulse: 72 80  Resp: 20   Temp: 99.5 F (37.5 C)   SpO2: 97% 100%    Exam:  Constitutional:  . Appears calm, mildly uncomfortable, ill but not toxic Respiratory.  Clear to auscultation bilaterally.  No wheezes, rales or rhonchi.  Normal respiratory effort. Cardiovascular.  Regular rate and rhythm.  No murmur, rub or gallop.  No significant lower extremity edema. Abdomen soft, nontender, nondistended. Skin and breasts.  Patient was examined with bedside RN Anabella R.  As chaperone.  Left breast appears unremarkable.  The right breast is edematous, erythematous and warm to touch.  No masses are appreciated, no exudate noted.  There is a macular rash with some excoriation over the sternum and upper breast that is not connected to the erythema. Psychiatric.  Grossly normal mood and affect.  Speech fluent and appropriate.  I have personally reviewed the following:   Today's Data  . CBG stable . No new labs  Scheduled Meds: . aspirin EC  81 mg Oral Daily  . ferrous gluconate  324 mg Oral BID  . heparin  5,000 Units Subcutaneous Q8H  . hydrocortisone   Topical BID  . insulin aspart  0-15 Units Subcutaneous TID WC  . insulin detemir  50 Units Subcutaneous QHS  . nystatin cream   Topical BID  . pantoprazole  40 mg Oral Daily  . pregabalin  100 mg Oral BID  . rosuvastatin  10 mg Oral q1800  . vancomycin variable dose per unstable renal function (pharmacist dosing)   Does not apply See admin instructions  . verapamil  80 mg Oral BID   Continuous Infusions: . sodium  chloride 75 mL/hr at 03/22/19 1154  . [START ON 03/23/2019] ceFEPime (MAXIPIME) IV      Principal Problem:   Cellulitis of right breast Active Problems:   Essential hypertension   Type 2 diabetes mellitus with peripheral neuropathy (HCC)   Type 2 diabetes mellitus with diabetic neuropathy (HCC)   Ductal carcinoma in situ (DCIS) of right breast   GERD (gastroesophageal reflux disease)   Rash   Fever   CKD (chronic kidney disease), stage IV ([place])   Aortic atherosclerosis ([place])   LOS: 1 day   How to contact the [facility] Attending or Consulting provider 7A - 7P or covering provider during after hours 7P -7A, for this patient?  1. Check the care team in [facility] and look for a) attending/consulting [facility] provider listed and b) the [facility] team listed 2. Log into [email or web address] and use [place]'s universal password to access. If you do not have the password, please contact the hospital operator. 3. Locate the [facility] provider you are looking for under [facility] and page to a number that you can be directly reached. 4. If you still have difficulty reaching the provider, please page the [facility] (Director on Call) for the Hospitalists listed on amion for assistance.

## 2019-03-22 NOTE — Consult Note (Signed)
SURGICAL CONSULTATION NOTE   HISTORY OF PRESENT ILLNESS (HPI):  73 y.o. female presented to [facility] ED for evaluation of confusion and weakness. Patient reports she felt very weak and confused since 2 days ago.  She denies chest pain or shortness of breath.  She denies coughing.  She endorses pain on the right breast.  It is importantly to know that patient is receiving radiation therapy due to right breast DCIS.  Last radiation treatment was on 03/17/2019.  Right breast pain localized to the lateral area.  There is no pain radiation.  Aggravating factor is applying pressure.  There is no alleviating factor.  Reports associated fever and chills.  Denies abdominal pain.  Denies diarrhea.  Denies dysuria or urinary frequency.  Surgery is consulted by Dr. Sarajane Jews in this context for evaluation and management of possible mastitis.  PAST MEDICAL HISTORY (PMH):  Past Medical History:  Diagnosis Date  . Anemia   . Arthropathy   . Benign neoplasm of breast   . Chest pain   . Chronic kidney disease    30% kidney function  . Diabetes ([place])   . Diabetic retinopathy ([place])   . Dyspnea   . Edema   . History of kidney stones   . Hyperlipemia   . Hypersomnia with sleep apnea   . Hypertension   . 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 Lumbago   . Osteoarthrosis   . Ovarian failure   . SOB (shortness of breath)      PAST SURGICAL HISTORY ([place]):  Past Surgical History:  Procedure Laterality Date  . BILATERAL SALPINGECTOMY    . BREAST BIOPSY Right 2014   neg- core  . BREAST BIOPSY Right 12/21/2018   [place] bx, venus clip,  DUCTAL CARCINOMA IN SITU  . BREAST BIOPSY Right 12/21/2018   [place] bx, ribbon clip,  FIBROEPITHELIAL PROLIFERATION WITH SCLEROSIS  . BREAST BIOPSY Right 01/01/2019   Affirm bx "X" clip-path pending  . BREAST DUCTAL SYSTEM EXCISION    . BREAST EXCISIONAL BIOPSY Right 2014   neg  . BREAST LUMPECTOMY Right 01/22/2019   path pending  . CATARACT EXTRACTION    .  CHOLECYSTECTOMY    . CYSTOSCOPY WITH STENT PLACEMENT Left 04/14/2015   Procedure: CYSTOSCOPY WITH STENT PLACEMENT;  Surgeon: Hollice Espy, MD;  Location: [facility] ORS;  Service: Urology;  Laterality: Left;  . EXTRACORPOREAL SHOCK WAVE LITHOTRIPSY Left 09/29/2014   Procedure: EXTRACORPOREAL SHOCK WAVE LITHOTRIPSY (ESWL);  Surgeon: Hollice Espy, MD;  Location: [facility] ORS;  Service: Urology;  Laterality: Left;  . EYE SURGERY     bilateral cataract  . LAPAROSCOPIC OOPHERECTOMY Left   . PARTIAL MASTECTOMY WITH NEEDLE LOCALIZATION Right 01/22/2019   Procedure: PARTIAL MASTECTOMY WITH NEEDLE LOCALIZATION;  Surgeon: Herbert Pun, MD;  Location: [facility] ORS;  Service: General;  Laterality: Right;  . TONSILLECTOMY    . TUBAL LIGATION    . URETEROSCOPY WITH HOLMIUM LASER LITHOTRIPSY Left 04/14/2015   Procedure: URETEROSCOPY WITH HOLMIUM LASER LITHOTRIPSY;  Surgeon: Hollice Espy, MD;  Location: [facility] ORS;  Service: Urology;  Laterality: Left;     MEDICATIONS:  Prior to Admission medications   Medication Sig Start Date End Date Taking? Authorizing Provider  APPLE CIDER VINEGAR PO Take 2 tablets by mouth daily.    Yes [provider]  aspirin EC 81 MG tablet Take 81 mg by mouth daily.    Yes [provider]  cholecalciferol (VITAMIN D) 1000 UNITS tablet Take 2,000 Units by mouth daily.  Yes [provider]  Ferrous Gluconate 324 (37.5 Fe) MG TABS Take 324 mg by mouth 2 (two) times daily.    Yes [provider]  furosemide (LASIX) 20 MG tablet Take 20 mg by mouth 2 (two) times daily as needed. 07/20/17  Yes [provider]  glimepiride (AMARYL) 4 MG tablet Take 1 tablet (4 mg total) by mouth 2 (two) times daily with a meal. Patient taking differently: Take 4 mg by mouth 2 (two) times daily.  10/01/18  Yes Boscia, Greer Ee, NP  Insulin Detemir (LEVEMIR FLEXTOUCH) 100 UNIT/ML Pen Inject 75 Units into the skin daily after supper. 03/15/19  Yes Boscia, Heather  E, NP  lisinopril (ZESTRIL) 40 MG tablet Take 40 mg by mouth daily.    Yes [provider]  Multiple Vitamin (MULTIVITAMIN) tablet Take 1 tablet by mouth daily at 12 noon.   Yes [provider]  pantoprazole (PROTONIX) 40 MG tablet TAKE 1 TABLET(S) BY MOUTH DAILY 01/18/19  Yes Boscia, Heather E, NP  pregabalin (LYRICA) 100 MG capsule Take 1 capsule (100 mg total) by mouth 3 (three) times daily. Patient taking differently: Take 100 mg by mouth 2 (two) times daily.  12/21/18  Yes Boscia, Heather E, NP  rosuvastatin (CRESTOR) 20 MG tablet TAKE 1 TABLET BY MOUTH EVERYDAY AT BEDTIME 03/15/19  Yes Boscia, Heather E, NP  traMADol-acetaminophen (ULTRACET) 37.5-325 MG tablet Take 1 tablet by mouth 2 (two) times daily as needed for moderate pain. 10/01/18  Yes Boscia, Heather E, NP  TRULICITY 1.5 WG/9.5AO SOPN Inject 1.5mg  [place] once weekly Patient taking differently: Inject 1.5 mg into the skin every 7 (seven) days.  01/07/19  Yes Boscia, Heather E, NP  verapamil (CALAN) 40 MG tablet TAKE 1 TABLET(S) BY MOUTH TWICE A DAY FOR HYPERTENSION Patient taking differently: Take 80 mg by mouth 2 (two) times daily.  12/08/18  Yes Boscia, Greer Ee, NP     ALLERGIES:  No Known Allergies   SOCIAL HISTORY:  Social History   Socioeconomic History  . Marital status: Married    Spouse name: Not on file  . Number of children: Not on file  . Years of education: Not on file  . Highest education level: Not on file  Occupational History  . Not on file  Social Needs  . Financial resource strain: Not on file  . Food insecurity    Worry: Not on file    Inability: Not on file  . Transportation needs    Medical: Not on file    Non-medical: Not on file  Tobacco Use  . Smoking status: Former Smoker    Packs/day: 1.00    Years: 30.00    Pack years: 30.00    Quit date: 04/11/1995    Years since quitting: 23.9  . Smokeless tobacco: Never Used  Substance and Sexual Activity  . Alcohol use: No     Alcohol/week: 0.0 standard drinks  . Drug use: No  . Sexual activity: Not on file  Lifestyle  . Physical activity    Days per week: Not on file    Minutes per session: Not on file  . Stress: Not on file  Relationships  . Social Herbalist on phone: Not on file    Gets together: Not on file    Attends religious service: Not on file    Active member of club or organization: Not on file    Attends meetings of clubs or organizations: Not on file  Relationship status: Not on file  . Intimate partner violence    Fear of current or ex partner: Not on file    Emotionally abused: Not on file    Physically abused: Not on file    Forced sexual activity: Not on file  Other Topics Concern  . Not on file  Social History Narrative  . Not on file    The patient currently resides (home / rehab facility / nursing home): Home The patient normally is (ambulatory / bedbound): Ambulatory   FAMILY HISTORY:  Family History  Problem Relation Age of Onset  . Breast cancer Maternal Grandmother 68  . Colon cancer Mother   . Lung cancer Father      REVIEW OF SYSTEMS:  Constitutional: denies weight loss.positive for fever, chills, and sweats  Eyes: denies any other vision changes, history of eye injury  ENT: denies sore throat, hearing problems  Respiratory: denies shortness of breath, wheezing  Cardiovascular: denies chest pain, palpitations  Gastrointestinal: denies abdominal pain, N/V, or diarrhea Genitourinary: denies burning with urination or urinary frequency Musculoskeletal: denies any other joint pains or cramps  Skin: Positive for rashes and skin discolorations on the right breast Neurological: denies any other headache, dizziness, weakness  Psychiatric: denies any other depression, anxiety   All other review of systems were negative   VITAL SIGNS:  Temp:  [98 F (36.7 C)-103.3 F (39.6 C)] 100.7 F (38.2 C) (11/30 1712) Pulse Rate:  [72-94] 90 (11/30 1604) Resp:   [18-22] 22 (11/30 1604) BP: (100-148)/(45-64) 137/50 (11/30 1604) SpO2:  [79 %-100 %] 98 % (11/30 1604) Weight:  [101.4 kg] 101.4 kg (11/29 2316)     Height: 5\' 3"  (160 cm) Weight: 101.4 kg BMI (Calculated): 39.61   INTAKE/OUTPUT:  This shift: Total I/O In: 561.3 [I.V.:266.7; IV Piggyback:294.6] Out: 150 [Urine:150]  Last 2 shifts: @IOLAST2SHIFTS @   PHYSICAL EXAM:  Constitutional:  -- Normal body habitus  -- Awake, alert, and oriented x3  Eyes:  -- Pupils equally round and reactive to light  -- No scleral icterus  Ear, nose, and throat:  -- No jugular venous distension  Pulmonary:  -- No crackles  -- Equal breath sounds bilaterally -- Breathing non-labored at rest Cardiovascular:  -- S1, S2 present  -- No pericardial rubs Gastrointestinal:  -- Abdomen soft, nontender, non-distended, no guarding or rebound tenderness -- No abdominal masses appreciated, pulsatile or otherwise  Musculoskeletal and Integumentary:  -- Wounds or skin discoloration: Right breast erythema.  There is blanching erythema.  There is no necrotic tissue.  Mild tender to palpation over the lateral aspect of the right breast. -- Extremities: B/L UE and LE FROM, hands and feet warm, no edema  Neurologic:  -- Motor function: intact and symmetric -- Sensation: intact and symmetric   Labs:  CBC Latest Ref Rng & Units 03/21/2019 03/08/2019 03/08/2013  WBC 4.0 - 10.5 K/uL 16.0(H) 6.3 5.7  Hemoglobin 12.0 - 15.0 g/dL 11.9(L) 10.5(L) 11.6(L)  Hematocrit 36.0 - 46.0 % 36.8 33.8(L) 34.8(L)  Platelets 150 - 400 K/uL 139(L) 134(L) 145(L)   CMP Latest Ref Rng & Units 03/21/2019 03/08/2013 07/23/2011  Glucose 70 - 99 mg/dL 232(H) 170(H) 126(H)  BUN 8 - 23 mg/dL 40(H) 26(H) 20(H)  Creatinine 0.44 - 1.00 mg/dL 2.17(H) 1.47(H) 0.89  Sodium 135 - 145 mmol/L 139 141 139  Potassium 3.5 - 5.1 mmol/L 4.3 4.1 4.5  Chloride 98 - 111 mmol/L 100 106 101  CO2 22 - 32 mmol/L 25 30 33(H)  Calcium 8.9 - 10.3 mg/dL 9.3 9.7 10.0   Total Protein 6.5 - 8.1 g/dL 7.7 - 7.7  Total Bilirubin 0.3 - 1.2 mg/dL 1.3(H) - 0.4  Alkaline Phos 38 - 126 U/L 81 - 85  AST 15 - 41 U/L 24 - 16  ALT 0 - 44 U/L 21 - 24    Imaging studies: I personally evaluated the images of the chest x-ray.  There is some congestion on the right side but no localized consolidation. EXAM: PORTABLE CHEST 1 VIEW  COMPARISON:  None.  FINDINGS: No edema or consolidation. Heart is borderline enlarged with pulmonary vascularity normal. No adenopathy. There is aortic atherosclerosis. There is degenerative change in the left shoulder.  IMPRESSION: No edema or consolidation. Borderline cardiac enlargement. No adenopathy. Aortic Atherosclerosis (ICD10-I70.0).   Electronically Signed   By: Lowella Grip III M.D.   On: 03/21/2019 15:00   Assessment/Plan:  73 y.o. female with fever of unknown origin, complicated by pertinent comorbidities including right breast cancer on radiation therapy, diabetes, acute over chronic  kidney disease.  Patient with fever, elevated white blood cell and weakness that are concerning for active infection.  At this moment there is unknown etiology.  Patient is not having chest pain or shortness of breath and is not having cough.  She is not having abdominal pain.  She is not having urinary frequency or dysuria.  The chest x-ray does not show a consolidation or sign of pneumonia.  Urinalysis is negative for infection.  There is no indication for abdominal pelvic CT scan since patient is not having abdominal pain or diarrhea.  At this moment the most likely focus come from the right breast surgical area.  It is difficult to differentiate between the erythema from radiation therapy versus infectious etiology.  Patient with expected seroma after partial mastectomy 2 months ago.  Since the patient has been in antibiotic for less than 24 hours we will try to see if there is improvement with the current antibiotic therapy with  cefepime and vancomycin.  If the patient does not improve within the next 24 hours I will consider aspiration versus drainage of the right breast as a diagnostic/therapeutic option.  We will continue to follow very closely to assist in the management of this patient.  Arnold Long, MD

## 2019-03-23 ENCOUNTER — Ambulatory Visit: Payer: Medicare HMO

## 2019-03-23 DIAGNOSIS — N61 Mastitis without abscess: Secondary | ICD-10-CM | POA: Diagnosis not present

## 2019-03-23 DIAGNOSIS — E119 Type 2 diabetes mellitus without complications: Secondary | ICD-10-CM | POA: Diagnosis not present

## 2019-03-23 DIAGNOSIS — R509 Fever, unspecified: Secondary | ICD-10-CM | POA: Diagnosis not present

## 2019-03-23 DIAGNOSIS — N184 Chronic kidney disease, stage 4 (severe): Secondary | ICD-10-CM | POA: Diagnosis not present

## 2019-03-23 LAB — CBC
HCT: 29.9 % — ABNORMAL LOW (ref 36.0–46.0)
Hemoglobin: 9.2 g/dL — ABNORMAL LOW (ref 12.0–15.0)
MCH: 28.2 pg (ref 26.0–34.0)
MCHC: 30.8 g/dL (ref 30.0–36.0)
MCV: 91.7 fL (ref 80.0–100.0)
Platelets: 125 10*3/uL — ABNORMAL LOW (ref 150–400)
RBC: 3.26 MIL/uL — ABNORMAL LOW (ref 3.87–5.11)
RDW: 16.5 % — ABNORMAL HIGH (ref 11.5–15.5)
WBC: 15.4 10*3/uL — ABNORMAL HIGH (ref 4.0–10.5)
nRBC: 0 % (ref 0.0–0.2)

## 2019-03-23 LAB — BASIC METABOLIC PANEL
Anion gap: 9 (ref 5–15)
BUN: 39 mg/dL — ABNORMAL HIGH (ref 8–23)
CO2: 21 mmol/L — ABNORMAL LOW (ref 22–32)
Calcium: 8.4 mg/dL — ABNORMAL LOW (ref 8.9–10.3)
Chloride: 111 mmol/L (ref 98–111)
Creatinine, Ser: 1.91 mg/dL — ABNORMAL HIGH (ref 0.44–1.00)
GFR calc Af Amer: 30 mL/min — ABNORMAL LOW (ref >60)
GFR calc non Af Amer: 26 mL/min — ABNORMAL LOW (ref >60)
Glucose, Bld: 54 mg/dL — ABNORMAL LOW (ref 70–99)
Potassium: 3.7 mmol/L (ref 3.5–5.1)
Sodium: 141 mmol/L (ref 135–145)

## 2019-03-23 LAB — GLUCOSE, CAPILLARY
Glucose-Capillary: 52 mg/dL — ABNORMAL LOW (ref 70–99)
Glucose-Capillary: 58 mg/dL — ABNORMAL LOW (ref 70–99)
Glucose-Capillary: 82 mg/dL (ref 70–99)
Glucose-Capillary: 136 mg/dL — ABNORMAL HIGH (ref 70–99)
Glucose-Capillary: 141 mg/dL — ABNORMAL HIGH (ref 70–99)
Glucose-Capillary: 161 mg/dL — ABNORMAL HIGH (ref 70–99)

## 2019-03-23 LAB — VANCOMYCIN, RANDOM: Vancomycin Rm: 15

## 2019-03-23 MED ORDER — INSULIN DETEMIR 100 UNIT/ML ~~LOC~~ SOLN
35.0000 [IU] | Freq: Every day | SUBCUTANEOUS | Status: DC
  Administered 2019-03-23: 35 [IU] via SUBCUTANEOUS
  Filled 2019-03-23 (×2): qty 0.35

## 2019-03-23 MED ORDER — VANCOMYCIN HCL IN DEXTROSE 1-5 GM/200ML-% IV SOLN
1000.0000 mg | INTRAVENOUS | Status: DC
  Administered 2019-03-23: 1000 mg via INTRAVENOUS
  Filled 2019-03-23: qty 200

## 2019-03-23 MED ORDER — SODIUM CHLORIDE 0.9 % IV SOLN
2.0000 g | Freq: Two times a day (BID) | INTRAVENOUS | Status: DC
  Administered 2019-03-23 – 2019-03-24 (×4): 2 g via INTRAVENOUS
  Filled 2019-03-23 (×5): qty 2

## 2019-03-23 MED ORDER — LOPERAMIDE HCL 1 MG/7.5ML PO SUSP
1.0000 mg | ORAL | Status: DC | PRN
  Administered 2019-03-23 (×2): 1 mg via ORAL
  Filled 2019-03-23 (×4): qty 7.5

## 2019-03-23 NOTE — Progress Notes (Signed)
Pharmacy Antibiotic Note  Kara [name] is a 73 y.o. female admitted on 03/21/2019 with sepsis of unclear etiology but thought to be related to R breast infection/cellulitis.  Pharmacy was consulted for Vancomycin and Cefepime dosing.  Her SCr was initially elevated but is now close to her baseline. Leukocytosis has improved slightly but still elevated. She received a total of 2000 mg IV vancomycin prior to the random level being drawn.  Vancomycin Random Level: 12/1 0526 15 mcg/mL  Plan: 1) begin vancomycin 1000 mg IV Q 48 hrs Goal AUC 400-550 Expected AUC: 437 T1/2: 30.6h Expected Css: 29.9/10.3 mcg/mL  SCr used: 1.91 SCr 2.17-->1.91 (baseline 1.7-1.8)  BMI 39.6  2) change cefepime to 2 grams IV every 12 hours    Height: 5\' 3"  (160 cm) Weight: 223 lb 8.7 oz (101.4 kg) IBW/kg (Calculated) : 52.4  Temp (24hrs), Avg:100.3 F (37.9 C), Min:98.2 F (36.8 C), Max:103.3 F (39.6 C)  Recent Labs  Lab 03/21/19 1450 03/21/19 2012 03/23/19 0526  WBC 16.0*  --  15.4*  CREATININE 2.17*  --  1.91*  LATICACIDVEN 1.5 1.3  --   VANCORANDOM  --   --  15    Estimated Creatinine Clearance: 30.3 mL/min (A) (by C-G formula based on SCr of 1.91 mg/dL (H)).    No Known Allergies  Antimicrobials this admission: Cefepime 11/29 >>  Vancomycin 11/29 >>  Flagyl 11/29 >>11/30  Microbiology results: 11/29 BCx: NGTD 11/29 UCx: negative   11/29 SARS CoV-2: negative   11/29 Resp panel: negative   Thank you for allowing pharmacy to be a part of this patient's care.  Dallie Piles 03/23/2019 7:27 AM

## 2019-03-23 NOTE — Progress Notes (Signed)
[facility] Hospital Day(s): 2.   Post op day(s):  Marland Kitchen   Interval History: Patient seen and examined, no acute events or new complaints overnight. Patient reports feeling better today.  She reports still having mild to moderate pain on the lateral aspect of the right breast.  The pain does not radiate to other part of the body.  Pain is aggravated by palpating the area.  There has been no alleviating factors.  Patient has not had any fever since yesterday night.  She feels stronger today.  Able to eat.  Vital signs in last 24 hours: [min-max] current  Temp:  [98.2 F (36.8 C)-103.3 F (39.6 C)] 98.2 F (36.8 C) (12/01 1148) Pulse Rate:  [72-98] 72 (12/01 1148) Resp:  [18-22] 18 (11/30 2013) BP: (122-137)/(50-56) 136/56 (12/01 1148) SpO2:  [89 %-98 %] 94 % (12/01 1148)     Height: 5\' 3"  (160 cm) Weight: 101.4 kg BMI (Calculated): 39.61   Physical Exam:  Constitutional: alert, cooperative and no distress  Respiratory: breathing non-labored at rest  Cardiovascular: regular rate and sinus rhythm  Breast: Right breast erythema around the proximal mastectomy scar.  No drainage.  No ischemic tissue.  Tender to palpation.  Today looks more clear than yesterday with improved redness.  Labs:  CBC Latest Ref Rng & Units 03/23/2019 03/21/2019 03/08/2019  WBC 4.0 - 10.5 K/uL 15.4(H) 16.0(H) 6.3  Hemoglobin 12.0 - 15.0 g/dL 9.2(L) 11.9(L) 10.5(L)  Hematocrit 36.0 - 46.0 % 29.9(L) 36.8 33.8(L)  Platelets 150 - 400 K/uL 125(L) 139(L) 134(L)   CMP Latest Ref Rng & Units 03/23/2019 03/21/2019 03/08/2013  Glucose 70 - 99 mg/dL 54(L) 232(H) 170(H)  BUN 8 - 23 mg/dL 39(H) 40(H) 26(H)  Creatinine 0.44 - 1.00 mg/dL 1.91(H) 2.17(H) 1.47(H)  Sodium 135 - 145 mmol/L 141 139 141  Potassium 3.5 - 5.1 mmol/L 3.7 4.3 4.1  Chloride 98 - 111 mmol/L 111 100 106  CO2 22 - 32 mmol/L 21(L) 25 30  Calcium 8.9 - 10.3 mg/dL 8.4(L) 9.3 9.7  Total Protein 6.5 - 8.1 g/dL - 7.7 -  Total Bilirubin 0.3 -  1.2 mg/dL - 1.3(H) -  Alkaline Phos 38 - 126 U/L - 81 -  AST 15 - 41 U/L - 24 -  ALT 0 - 44 U/L - 21 -    Imaging studies: No new pertinent imaging studies   Assessment/Plan:  73 y.o. female with right breast mastitis.  Today she seems that she is responding adequately to antibiotic therapy.  She has not had any fever since yesterday.  She also is more alert, and with better strength and appearance compared to yesterday.  There is respiratory panel was negative, the blood culture has been negative in 48 hours.  As in the patient had a mastitis since it has been the only source identified.  At this moment since she responded to antibiotic therapy I am holding any procedures.  There was minimal decrease in white blood cell count but significant improvement in physical exam and symptoms.  If she does not improve we will consider aspiration versus incision and drainage.  Will follow closely.  Arnold Long, MD

## 2019-03-23 NOTE — Progress Notes (Signed)
PROGRESS NOTE  Kara [name] AJG:[id number] DOB: Apr 14, 1946 DOA: 03/21/2019 PCP: Lavera Guise, MD  Brief History   73 year old woman PMH right breast ductal carcinoma in situ, status post right lumpectomy and undergoing radiation with last treatment approximately 4 days ago; hypertension, diabetes mellitus type 2, CKD stage IV, irritable bowel, tremor, presented with generalized weakness and confusion.  Husband reportedly unable to get her out of bed.  Confusion.  Sore throat with anterior right cervical region tenderness.  No nausea, vomiting or diarrhea, no chest pain or abdominal pain.  Reported rash right anterior chest above right breast that is pruritic, developed 5 days prior to presentation.  Admitted for fever.  Subsequently found to have right breast mastitis.  Slowly improving on IV antibiotics.  General surgery following closely.  A & P  Febrile illness secondary to right breast mastitis. In context of right breast ductal carcinoma in situ, status post right lumpectomy, currently undergoing radiation treatment; followed by Drs. Chrystal, Finnegan and Cintron-Diaz. Sepsis considered on admission but no evidence of new or endorgan dysfunction, subsequently ruled out.  Lactic acid was within normal limits and creatinine appears to be at baseline. Procalcitonin 0.77.  Chest x-ray and urinalysis unremarkable.  PCR Covid test negative.   --Appreciate surgical evaluation and recommendations.  Breast does appear to be better today.  Continue to follow. --Continue empiric antibiotics vancomycin, cefepime pending culture data.  --CBC in a.m.  Macular erythematous rash right anterior chest above right breast, suspected to be related to radiation therapy. --Continue supportive care  Diabetes mellitus type 2 with diabetic retinopathy, neuropathy, CKD.  On 75 units Levemir, Trulicity, Amaryl, Lyrica as an outpatient. --Hypoglycemic and this a.m.  Will decrease Levemir to 35 units nightly.  Continue  SSI; continue Lyrica. Hold Trulicity and Amaryl.   CKD stage IV followed by Dr. Candiss Norse, on lisinopril as an outpatient.  Baseline creatinine appears to be approximately 1.7-2.1. --appears to be at baseline --Continue to hold lisinopril  Iron deficiency anemia, anemia of CKD --Remained stable, Continue iron  Lumbago with bilateral lower extremity numbness, suspect related to diabetic neuropathy --Vitamin B12 within normal limits   Incidental finding aortic atherosclerosis by chest x-ray --continue Crestor.  DVT prophylaxis: heparin Code Status: DNR Family Communication: none Disposition Plan: home    Murray Hodgkins, MD  [facility] Direct contact: see [email or web address] (further directions at bottom of note if needed) 7PM-7AM contact night coverage as at bottom of note 03/23/2019, 6:54 PM  LOS: 2 days   Significant Hospital Events   . 11/29 presented with fever, confusion, generalized weakness.  Admitted for suspected sepsis, acute kidney injury, anterior chest rash   Consults:  .    Procedures:  .   Significant Diagnostic Tests:  . EKG shows sinus tachycardia with no acute changes . Chest x-ray 11/29, no acute disease   Micro Data:  . SARS-CoV-2 negative . Urinalysis negative . RVP negative . 11/29 blood cultures pending . 11/29 urine culture pending   Antimicrobials:  . Cefepime 11/29 > . Metronidazole 11/29 > . Vancomycin 11/29 >  Interval History/Subjective  Feels about the same today, was febrile again yesterday 103.3.  Continues to have right breast pain.  Objective   Vitals:  Vitals:   03/23/19 0025 03/23/19 1148  BP:  (!) 136/56  Pulse: 83 72  Resp:    Temp:  98.2 F (36.8 C)  SpO2: 97% 94%    Exam:  Constitutional.  Appears ill but not toxic.  Appears uncomfortable but  calm. Respiratory.  Clear to auscultation bilaterally.  No wheezes, rales or rhonchi. Cardiovascular.  Regular rate and rhythm.  No murmur, rub or gallop. Psychiatric.   Grossly normal mood and affect.  Speech fluent and appropriate. Breast exam conducted with 2 RNs present, 1 and RN clinical instructor.  Right breast appears to have less erythema over the 12-3 o'clock quadrant but there is still significant erythema, induration of the skin and tenderness to palpation 6-9:00 quadrant and 9-12:00 quadrant  I have personally reviewed the following:   Today's Data  . CBG 2 episodes of hypoglycemia this a.m. Marland Kitchen Creatinine improved, 1.91.  Potassium within normal limits. . WBC without significant change, 15.4 . Hemoglobin slightly down to 9.8. Marland Kitchen Platelets slightly down at 125 . Respiratory panel negative  Scheduled Meds: . aspirin EC  81 mg Oral Daily  . ferrous gluconate  324 mg Oral BID  . heparin  5,000 Units Subcutaneous Q8H  . hydrocortisone   Topical BID  . insulin aspart  0-15 Units Subcutaneous TID WC  . insulin detemir  50 Units Subcutaneous QHS  . nystatin cream   Topical BID  . pantoprazole  40 mg Oral Daily  . pregabalin  100 mg Oral BID  . rosuvastatin  10 mg Oral q1800  . vancomycin variable dose per unstable renal function (pharmacist dosing)   Does not apply See admin instructions  . verapamil  80 mg Oral BID   Continuous Infusions: . sodium chloride 75 mL/hr at 03/23/19 1703  . ceFEPime (MAXIPIME) IV 200 mL/hr at 03/23/19 1511  . vancomycin Stopped (03/23/19 1621)    Principal Problem:   Cellulitis of right breast Active Problems:   Essential hypertension   Type 2 diabetes mellitus with peripheral neuropathy (HCC)   Type 2 diabetes mellitus with diabetic neuropathy (HCC)   Ductal carcinoma in situ (DCIS) of right breast   GERD (gastroesophageal reflux disease)   Rash   Fever   CKD (chronic kidney disease), stage IV ([place])   Aortic atherosclerosis ([place])   LOS: 2 days   How to contact the [facility] Attending or Consulting provider 7A - 7P or covering provider during after hours 7P -7A, for this patient?  1. Check the care team in  [facility] and look for a) attending/consulting [facility] provider listed and b) the [facility] team listed 2. Log into [email or web address] and use [place]'s universal password to access. If you do not have the password, please contact the hospital operator. 3. Locate the [facility] provider you are looking for under [facility] and page to a number that you can be directly reached. 4. If you still have difficulty reaching the provider, please page the [facility] (Director on Call) for the Hospitalists listed on amion for assistance.

## 2019-03-23 NOTE — Progress Notes (Signed)
Inpatient Diabetes Program Recommendations  AACE/ADA: New Consensus Statement on Inpatient Glycemic Control (2015)  Target Ranges:  Prepandial:   less than 140 mg/dL      Peak postprandial:   less than 180 mg/dL (1-2 hours)      Critically ill patients:  140 - 180 mg/dL   Results for Kara [name], Kara [name] (MRN [id number]) as of 03/23/2019 09:34  Ref. Range 03/22/2019 08:03 03/22/2019 11:57 03/22/2019 17:13 03/22/2019 20:51  Glucose-Capillary Latest Ref Range: 70 - 99 mg/dL 149 (H)  2 units NOVOLOG  96 123 (H)  2 units NOVOLOG  98    50 units LEVEMIR given at 9:30pm    Results for Kara [name], Kara [name] (MRN [id number]) as of 03/23/2019 09:34  Ref. Range 03/23/2019 07:52 03/23/2019 08:03 03/23/2019 08:34  Glucose-Capillary Latest Ref Range: 70 - 99 mg/dL 52 (L) 58 (L) 82    Results for Kara [name], Kara [name] (MRN [id number]) as of 03/23/2019 09:34  Ref. Range 03/21/2019 20:12  Hemoglobin A1C Latest Ref Range: 4.8 - 5.6 % 7.6 (H)   Admit with: Febrile illness likely related to right breast infection/cellulitis  History: DM, CKD 4, Breast Cancer (getting Radiation)  Home DM Meds: Amaryl 4 mg BID       Levemir 75 units QPM       Trulicity 1.5 mg Qweek  Current Orders: Novolog Moderate Correction Scale/ SSI (0-15 units) TID AC      Levemir 50 units QHS     MD- Note patient with Hypoglycemia this AM (CBG 52 mg/dl) after getting Levemir 50 units last PM  Please consider reducing Levemir to 35 units QHS (30% reduction)    --Will follow patient during hospitalization--  Wyn Quaker RN, MSN, CDE Diabetes Coordinator Inpatient Glycemic Control Team Team Pager: [phone] (8a-5p)

## 2019-03-24 ENCOUNTER — Ambulatory Visit: Payer: Medicare HMO

## 2019-03-24 ENCOUNTER — Encounter: Payer: Self-pay | Admitting: Anesthesiology

## 2019-03-24 ENCOUNTER — Inpatient Hospital Stay: Payer: Medicare HMO | Admitting: Certified Registered Nurse Anesthetist

## 2019-03-24 ENCOUNTER — Encounter
Admission: EM | Disposition: A | Discharge status: Home Health | Payer: Self-pay | Source: Home / Self Care | Attending: Family Medicine

## 2019-03-24 DIAGNOSIS — N61 Mastitis without abscess: Secondary | ICD-10-CM | POA: Diagnosis not present

## 2019-03-24 DIAGNOSIS — D0511 Intraductal carcinoma in situ of right breast: Secondary | ICD-10-CM | POA: Diagnosis not present

## 2019-03-24 DIAGNOSIS — E119 Type 2 diabetes mellitus without complications: Secondary | ICD-10-CM | POA: Diagnosis not present

## 2019-03-24 DIAGNOSIS — N184 Chronic kidney disease, stage 4 (severe): Secondary | ICD-10-CM | POA: Diagnosis not present

## 2019-03-24 HISTORY — PX: IRRIGATION AND DEBRIDEMENT ABSCESS: SHX5252

## 2019-03-24 LAB — BASIC METABOLIC PANEL
Anion gap: 7 (ref 5–15)
BUN: 35 mg/dL — ABNORMAL HIGH (ref 8–23)
CO2: 23 mmol/L (ref 22–32)
Calcium: 8.5 mg/dL — ABNORMAL LOW (ref 8.9–10.3)
Chloride: 112 mmol/L — ABNORMAL HIGH (ref 98–111)
Creatinine, Ser: 1.77 mg/dL — ABNORMAL HIGH (ref 0.44–1.00)
GFR calc Af Amer: 33 mL/min — ABNORMAL LOW (ref >60)
GFR calc non Af Amer: 28 mL/min — ABNORMAL LOW (ref >60)
Glucose, Bld: 124 mg/dL — ABNORMAL HIGH (ref 70–99)
Potassium: 3.9 mmol/L (ref 3.5–5.1)
Sodium: 142 mmol/L (ref 135–145)

## 2019-03-24 LAB — GLUCOSE, CAPILLARY
Glucose-Capillary: 94 mg/dL (ref 70–99)
Glucose-Capillary: 64 mg/dL — ABNORMAL LOW (ref 70–99)
Glucose-Capillary: 106 mg/dL — ABNORMAL HIGH (ref 70–99)
Glucose-Capillary: 77 mg/dL (ref 70–99)
Glucose-Capillary: 114 mg/dL — ABNORMAL HIGH (ref 70–99)
Glucose-Capillary: 100 mg/dL — ABNORMAL HIGH (ref 70–99)

## 2019-03-24 LAB — CBC
HCT: 27.7 % — ABNORMAL LOW (ref 36.0–46.0)
Hemoglobin: 9.1 g/dL — ABNORMAL LOW (ref 12.0–15.0)
MCH: 28.9 pg (ref 26.0–34.0)
MCHC: 32.9 g/dL (ref 30.0–36.0)
MCV: 87.9 fL (ref 80.0–100.0)
Platelets: 134 10*3/uL — ABNORMAL LOW (ref 150–400)
RBC: 3.15 MIL/uL — ABNORMAL LOW (ref 3.87–5.11)
RDW: 16.5 % — ABNORMAL HIGH (ref 11.5–15.5)
WBC: 11.2 10*3/uL — ABNORMAL HIGH (ref 4.0–10.5)
nRBC: 0 % (ref 0.0–0.2)

## 2019-03-24 SURGERY — IRRIGATION AND DEBRIDEMENT ABSCESS
Anesthesia: General | Site: Breast | Laterality: Right

## 2019-03-24 MED ORDER — FENTANYL CITRATE (PF) 100 MCG/2ML IJ SOLN
INTRAMUSCULAR | Status: AC
  Filled 2019-03-24: qty 2

## 2019-03-24 MED ORDER — SUCCINYLCHOLINE CHLORIDE 20 MG/ML IJ SOLN
INTRAMUSCULAR | Status: DC | PRN
  Administered 2019-03-24: 100 mg via INTRAVENOUS

## 2019-03-24 MED ORDER — BUPIVACAINE-EPINEPHRINE (PF) 0.25% -1:200000 IJ SOLN
INTRAMUSCULAR | Status: AC
  Filled 2019-03-24: qty 30

## 2019-03-24 MED ORDER — LIDOCAINE HCL (CARDIAC) PF 100 MG/5ML IV SOSY
PREFILLED_SYRINGE | INTRAVENOUS | Status: DC | PRN
  Administered 2019-03-24: 100 mg via INTRAVENOUS

## 2019-03-24 MED ORDER — VANCOMYCIN HCL 1.25 G IV SOLR
1250.0000 mg | INTRAVENOUS | Status: DC
  Administered 2019-03-24: 1250 mg via INTRAVENOUS
  Filled 2019-03-24 (×2): qty 1250

## 2019-03-24 MED ORDER — IPRATROPIUM-ALBUTEROL 0.5-2.5 (3) MG/3ML IN SOLN
3.0000 mL | RESPIRATORY_TRACT | Status: AC
  Administered 2019-03-24: 3 mL via RESPIRATORY_TRACT

## 2019-03-24 MED ORDER — FENTANYL CITRATE (PF) 100 MCG/2ML IJ SOLN
INTRAMUSCULAR | Status: DC | PRN
  Administered 2019-03-24: 50 ug via INTRAVENOUS

## 2019-03-24 MED ORDER — SODIUM CHLORIDE 0.9 % IV SOLN
INTRAVENOUS | Status: DC | PRN
  Administered 2019-03-24: 15:00:00 via INTRAVENOUS

## 2019-03-24 MED ORDER — MORPHINE SULFATE (PF) 2 MG/ML IV SOLN
2.0000 mg | INTRAVENOUS | Status: DC | PRN
  Administered 2019-03-24: 2 mg via INTRAVENOUS
  Filled 2019-03-24: qty 1

## 2019-03-24 MED ORDER — DEXTROSE 50 % IV SOLN
12.5000 g | Freq: Once | INTRAVENOUS | Status: AC
  Administered 2019-03-24: 12.5 g via INTRAVENOUS

## 2019-03-24 MED ORDER — MIDAZOLAM HCL 2 MG/2ML IJ SOLN
INTRAMUSCULAR | Status: AC
  Filled 2019-03-24: qty 2

## 2019-03-24 MED ORDER — DEXTROSE 50 % IV SOLN
12.5000 g | INTRAVENOUS | Status: AC
  Administered 2019-03-24: 12.5 g via INTRAVENOUS
  Filled 2019-03-24: qty 50

## 2019-03-24 MED ORDER — ONDANSETRON HCL 4 MG/2ML IJ SOLN: 4.0000 mg | Freq: Once | INTRAMUSCULAR | Status: DC | PRN

## 2019-03-24 MED ORDER — BUPIVACAINE-EPINEPHRINE 0.25% -1:200000 IJ SOLN
INTRAMUSCULAR | Status: DC | PRN
  Administered 2019-03-24: 30 mL

## 2019-03-24 MED ORDER — ONDANSETRON HCL 4 MG/2ML IJ SOLN
INTRAMUSCULAR | Status: DC | PRN
  Administered 2019-03-24: 4 mg via INTRAVENOUS

## 2019-03-24 MED ORDER — INSULIN DETEMIR 100 UNIT/ML ~~LOC~~ SOLN
28.0000 [IU] | Freq: Every day | SUBCUTANEOUS | Status: DC
  Administered 2019-03-24: 28 [IU] via SUBCUTANEOUS
  Filled 2019-03-24 (×2): qty 0.28

## 2019-03-24 MED ORDER — PROPOFOL 10 MG/ML IV BOLUS
INTRAVENOUS | Status: DC | PRN
  Administered 2019-03-24: 130 mg via INTRAVENOUS

## 2019-03-24 MED ORDER — IPRATROPIUM-ALBUTEROL 0.5-2.5 (3) MG/3ML IN SOLN
RESPIRATORY_TRACT | Status: AC
  Filled 2019-03-24: qty 3

## 2019-03-24 MED ORDER — FENTANYL CITRATE (PF) 100 MCG/2ML IJ SOLN: 25.0000 ug | INTRAMUSCULAR | Status: DC | PRN

## 2019-03-24 MED ORDER — TRAMADOL-ACETAMINOPHEN 37.5-325 MG PO TABS
1.0000 | ORAL_TABLET | Freq: Four times a day (QID) | ORAL | Status: DC | PRN
  Filled 2019-03-24: qty 1

## 2019-03-24 MED ORDER — ROCURONIUM BROMIDE 100 MG/10ML IV SOLN
INTRAVENOUS | Status: DC | PRN
  Administered 2019-03-24: 10 mg via INTRAVENOUS

## 2019-03-24 MED ORDER — DEXAMETHASONE SODIUM PHOSPHATE 10 MG/ML IJ SOLN
INTRAMUSCULAR | Status: DC | PRN
  Administered 2019-03-24: 10 mg via INTRAVENOUS

## 2019-03-24 MED ORDER — HYDROGEN PEROXIDE 3 % EX SOLN
CUTANEOUS | Status: DC | PRN
  Administered 2019-03-24: 1 via TOPICAL

## 2019-03-24 SURGICAL SUPPLY — 19 items
BLADE SURG 15 STRL LF DISP TIS (BLADE) ×1 IMPLANT
BLADE SURG 15 STRL SS (BLADE) ×2
BNDG GAUZE 4.5X4.1 6PLY STRL (MISCELLANEOUS) ×2 IMPLANT
CANISTER SUCT 3000ML PPV (MISCELLANEOUS) ×3 IMPLANT
DRAPE LAPAROTOMY 77X122 PED (DRAPES) ×3 IMPLANT
ELECT REM PT RETURN 9FT ADLT (ELECTROSURGICAL) ×3
ELECTRODE REM PT RTRN 9FT ADLT (ELECTROSURGICAL) ×1 IMPLANT
GAUZE SPONGE 4X4 12PLY STRL (GAUZE/BANDAGES/DRESSINGS) ×2 IMPLANT
GLOVE BIO SURGEON STRL SZ 6.5 (GLOVE) ×2 IMPLANT
GLOVE BIO SURGEONS STRL SZ 6.5 (GLOVE) ×1
GLOVE BIOGEL PI IND STRL 6.5 (GLOVE) ×1 IMPLANT
GLOVE BIOGEL PI INDICATOR 6.5 (GLOVE) ×2
GOWN STRL REUS W/ TWL LRG LVL3 (GOWN DISPOSABLE) ×2 IMPLANT
GOWN STRL REUS W/TWL LRG LVL3 (GOWN DISPOSABLE) ×4
NEEDLE HYPO 22GX1.5 SAFETY (NEEDLE) ×3 IMPLANT
NS IRRIG 1000ML POUR BTL (IV SOLUTION) ×3 IMPLANT
PACK BASIN MINOR ARMC (MISCELLANEOUS) ×3 IMPLANT
SOL PREP PVP 2OZ (MISCELLANEOUS) ×6
SOLUTION PREP PVP 2OZ (MISCELLANEOUS) ×2 IMPLANT

## 2019-03-24 NOTE — Progress Notes (Signed)
Hypoglycemic Event  CBG: 64  Treatment: D50 25 mL (12.5 gm)  Symptoms: Shaky  Follow-up CBG: Time:1157 CBG Result:106  Possible Reasons for Event: Inadequate meal intake  Comments/MD notified: MD notified.    Francesco Sor

## 2019-03-24 NOTE — Anesthesia Procedure Notes (Signed)
Procedure Name: Intubation Date/Time: 03/24/2019 2:51 PM Performed by: Johnna Acosta, CRNA Pre-anesthesia Checklist: Patient identified, Emergency Drugs available, Suction available, Patient being monitored and Timeout performed Patient Re-evaluated:Patient Re-evaluated prior to induction Oxygen Delivery Method: Circle system utilized Preoxygenation: Pre-oxygenation with 100% oxygen Induction Type: IV induction and Cricoid Pressure applied Ventilation: Mask ventilation with difficulty, Oral airway inserted - appropriate to patient size and Two handed mask ventilation required Laryngoscope Size: McGraph and 3 Grade View: Grade II Tube type: Oral Tube size: 7.0 mm Number of attempts: 1 Airway Equipment and Method: Stylet and Video-laryngoscopy Placement Confirmation: ETT inserted through vocal cords under direct vision,  positive ETCO2 and breath sounds checked- equal and bilateral Secured at: 20 cm Tube secured with: Tape Dental Injury: Teeth and Oropharynx as per pre-operative assessment  Difficulty Due To: Difficulty was anticipated, Difficult Airway- due to reduced neck mobility, Difficult Airway- due to limited oral opening and Difficult Airway- due to large tongue

## 2019-03-24 NOTE — Anesthesia Preprocedure Evaluation (Signed)
Anesthesia Evaluation  Patient identified by MRN, date of birth, ID band Patient awake    Reviewed: Allergy & Precautions, H&P , NPO status , Patient's Chart, lab work & pertinent test results  History of Anesthesia Complications Negative for: history of anesthetic complications  Airway Mallampati: III  TM Distance: <3 FB Neck ROM: limited    Dental  (+) Chipped   Pulmonary neg shortness of breath, sleep apnea , former smoker,           Cardiovascular Exercise Tolerance: Good hypertension, (-) angina(-) Past MI and (-) DOE      Neuro/Psych  Neuromuscular disease negative psych ROS   GI/Hepatic negative GI ROS, Neg liver ROS, neg GERD  ,  Endo/Other  diabetes, Type 2, Insulin Dependent  Renal/GU Renal InsufficiencyRenal disease     Musculoskeletal  (+) Arthritis , Osteoarthritis,    Abdominal   Peds  Hematology negative hematology ROS (+) anemia ,   Anesthesia Other Findings Past Medical History: No date: Anemia No date: Arthropathy No date: Benign neoplasm of breast No date: Chest pain No date: Chronic kidney disease     Comment:  30% kidney function No date: Diabetes (HCC) No date: Diabetic retinopathy (HCC) No date: Dyspnea No date: Edema No date: History of kidney stones No date: Hyperlipemia No date: Hypersomnia with sleep apnea No date: Hypertension No date: 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HCC) No date: Lumbago No date: Osteoarthrosis No date: Ovarian failure No date: SOB (shortness of breath)  Past Surgical History: No date: BILATERAL SALPINGECTOMY 2014: BREAST BIOPSY; Right     Comment:  neg- core 12/21/2018: BREAST BIOPSY; Right     Comment:  [place] bx, venus clip,  DUCTAL CARCINOMA IN SITU 12/21/2018: BREAST BIOPSY; Right     Comment:  [place] bx, ribbon clip,  FIBROEPITHELIAL PROLIFERATION WITH               SCLEROSIS 01/01/2019: BREAST BIOPSY; Right     Comment:  Affirm bx "X" clip-path  pending No date: [place] EXCISION 2014: BREAST EXCISIONAL BIOPSY; Right     Comment:  neg 01/22/2019: BREAST LUMPECTOMY; Right     Comment:  path pending No date: CATARACT EXTRACTION No date: CHOLECYSTECTOMY 04/14/2015: CYSTOSCOPY WITH STENT PLACEMENT; Left     Comment:  Procedure: CYSTOSCOPY WITH STENT PLACEMENT;  Surgeon:               Hollice Espy, MD;  Location: [facility] ORS;  Service:               Urology;  Laterality: Left; 09/29/2014: EXTRACORPOREAL SHOCK WAVE LITHOTRIPSY; Left     Comment:  Procedure: EXTRACORPOREAL SHOCK WAVE LITHOTRIPSY (ESWL);              Surgeon: Hollice Espy, MD;  Location: [facility] ORS;                Service: Urology;  Laterality: Left; No date: EYE SURGERY     Comment:  bilateral cataract No date: LAPAROSCOPIC OOPHERECTOMY; Left No date: TONSILLECTOMY No date: TUBAL LIGATION 04/14/2015: URETEROSCOPY WITH HOLMIUM LASER LITHOTRIPSY; Left     Comment:  Procedure: URETEROSCOPY WITH HOLMIUM LASER LITHOTRIPSY;               Surgeon: Hollice Espy, MD;  Location: [facility] ORS;                Service: Urology;  Laterality: Left;  BMI    Body Mass Index: 39.92 kg/m      Reproductive/Obstetrics  negative OB ROS                             Anesthesia Physical  Anesthesia Plan  ASA: III  Anesthesia Plan: General ETT   Post-op Pain Management:    Induction: Intravenous  PONV Risk Score and Plan: Ondansetron, Dexamethasone, Midazolam and Treatment may vary due to age or medical condition  Airway Management Planned: Oral ETT and Video Laryngoscope Planned  Additional Equipment:   Intra-op Plan:   Post-operative Plan: Extubation in OR  Informed Consent: I have reviewed the patients History and Physical, chart, labs and discussed the procedure including the risks, benefits and alternatives for the proposed anesthesia with the patient or authorized representative who has indicated his/her understanding and acceptance.      Dental Advisory Given  Plan Discussed with: Anesthesiologist, CRNA and Surgeon  Anesthesia Plan Comments: (Patient consented for risks of anesthesia including but not limited to:  - adverse reactions to medications - damage to teeth, lips or other oral mucosa - sore throat or hoarseness - Damage to heart, brain, lungs or loss of life  Patient voiced understanding.)        Anesthesia Quick Evaluation

## 2019-03-24 NOTE — Progress Notes (Signed)
Pharmacy Antibiotic Note  Kara [name] is a 73 y.o. female admitted on 03/21/2019 with sepsis of unclear etiology but thought to be related to R breast infection/cellulitis.  Pharmacy was consulted for Vancomycin and Cefepime dosing.  Her SCr was initially elevated but is now back to her baseline. Leukocytosis has improved slightly but still slightly elevated.   Plan: 1) increase vancomycin to 1250 mg IV Q 48 hrs Goal AUC 400-550 Expected AUC: 513 T1/2: 28.7 h Expected Css: 36.1/11.8 mcg/mL  SCr used: 1.77 SCr 2.17-->1.77 (baseline 1.7-1.8)  BMI 39.6  2) continue cefepime 2 grams IV every 12 hours    Height: 5\' 3"  (160 cm) Weight: 223 lb 8.7 oz (101.4 kg) IBW/kg (Calculated) : 52.4  Temp (24hrs), Avg:98.6 F (37 C), Min:98.2 F (36.8 C), Max:99.2 F (37.3 C)  Recent Labs  Lab 03/21/19 1450 03/21/19 2012 03/23/19 0526 03/24/19 0511  WBC 16.0*  --  15.4* 11.2*  CREATININE 2.17*  --  1.91* 1.77*  LATICACIDVEN 1.5 1.3  --   --   VANCORANDOM  --   --  15  --     Estimated Creatinine Clearance: 32.7 mL/min (A) (by C-G formula based on SCr of 1.77 mg/dL (H)).    No Known Allergies  Antimicrobials this admission: Cefepime 11/29 >>  Vancomycin 11/29 >>  Flagyl 11/29 >>11/30  Microbiology results: 11/29 BCx: NGTD 11/29 UCx: negative   11/29 SARS CoV-2: negative   11/29 Resp panel: negative   Thank you for allowing pharmacy to be a part of this patient's care.  Dallie Piles 03/24/2019 7:13 AM

## 2019-03-24 NOTE — Anesthesia Post-op Follow-up Note (Signed)
Anesthesia QCDR form completed.        

## 2019-03-24 NOTE — Op Note (Signed)
Preoperative diagnosis: Right breast abscess.  Postoperative diagnosis: Right breast abscess.  Procedure: Right breast mastotomy with drainage of large abscess.  Anesthesia: GETA  Surgeon: Dr. Windell Moment  Wound Classification: contaminated  Indications:  Patient is a 73 y.o. female with a palpable right breast fluid collection with erythema and tenderness over the lateral aspect of the right breast.  Patient with leukocytosis.  Findings: 1.  500 cc of serous and purulent drainage aspirated 2.  No palpable mass or abnormality inside the cavity 3.  Adequate hemostasis  Description of procedure: The patient was taken to the operating room and placed supine on the operating table, and after general anesthesia was administered, the right chest was prepped and draped in the usual sterile fashion. A time-out was completed verifying correct patient, procedure, site, positioning, and implant(s) and/or special equipment prior to beginning this procedure.  An incision was done on the lateral aspect of the right breast over the most fluctuant area.  Wound amount of serous fluid was aspirated.  After around 300 cc of serous fluid was aspirated, abundant amount (200 cc) of purulent fluid was aspirated.  The cavity was irrigated with peroxide and water.  Tissue inside the cavity was clean with gauze.  No palpable abnormality was identified. The patient tolerated the procedure well and was taken to the postanesthesia care unit in stable condition.   Specimen: Culture of the right breast abscess  Complications: None  Estimated Blood Loss: Minimal

## 2019-03-24 NOTE — Progress Notes (Signed)
[facility] [place] NAME: Kara [name]    MR#:  [id number]  DATE OF BIRTH:  1945-11-24  SUBJECTIVE:  patient complains of right breast localized tenderness. Remains afebrile. While stable. NPO for incision drainage planned today by surgery  REVIEW OF SYSTEMS:   Review of Systems  Constitutional: Negative for chills, fever and weight loss.  HENT: Negative for ear discharge, ear pain and nosebleeds.   Eyes: Negative for blurred vision, pain and discharge.  Respiratory: Negative for sputum production, shortness of breath, wheezing and stridor.   Cardiovascular: Negative for chest pain, palpitations, orthopnea and PND.  Gastrointestinal: Negative for abdominal pain, diarrhea, nausea and vomiting.  Genitourinary: Negative for frequency and urgency.  Musculoskeletal: Negative for back pain and joint pain.  Skin:       Erythema redness and tenderness around the right breast area  Neurological: Negative for sensory change, speech change, focal weakness and weakness.  Psychiatric/Behavioral: Negative for depression and hallucinations. The patient is not nervous/anxious.    Tolerating Diet: NPO for surgery Tolerating PT: ambulatory  DRUG ALLERGIES:  No Known Allergies  VITALS:  Blood pressure (!) 137/52, pulse 81, temperature 98.7 F (37.1 C), temperature source Oral, resp. rate 20, height 5\' 3"  (1.6 m), weight 101.4 kg, SpO2 95 %.  PHYSICAL EXAMINATION:   Physical Exam  GENERAL:  73 y.o.-year-old patient lying in the bed with no acute distress. Obese EYES: Pupils equal, round, reactive to light and accommodation. No scleral icterus. Extraocular muscles intact.  HEENT: Head atraumatic, normocephalic. Oropharynx and nasopharynx clear.  NECK:  Supple, no jugular venous distention. No thyroid enlargement, no tenderness.  LUNGS: Normal breath sounds bilaterally, no wheezing, rales, rhonchi. No use of accessory muscles of respiration.   CARDIOVASCULAR: S1, S2 normal. No murmurs, rubs, or gallops.  ABDOMEN: Soft, nontender, nondistended. Bowel sounds present. No organomegaly or mass.  EXTREMITIES: No cyanosis, clubbing or edema b/l.    NEUROLOGIC: Cranial nerves II through XII are intact. No focal Motor or sensory deficits b/l.   PSYCHIATRIC:  patient is alert and oriented x 3.  SKIN:  Right breast erythema  LABORATORY PANEL:  CBC Recent Labs  Lab 03/24/19 0511  WBC 11.2*  HGB 9.1*  HCT 27.7*  PLT 134*    Chemistries  Recent Labs  Lab 03/21/19 1450  03/24/19 0511  NA 139   < > 142  K 4.3   < > 3.9  CL 100   < > 112*  CO2 25   < > 23  GLUCOSE 232*   < > 124*  BUN 40*   < > 35*  CREATININE 2.17*   < > 1.77*  CALCIUM 9.3   < > 8.5*  AST 24  --   --   ALT 21  --   --   ALKPHOS 81  --   --   BILITOT 1.3*  --   --    < > = values in this interval not displayed.   Cardiac Enzymes No results for input(s): TROPONINI in the last 168 hours. RADIOLOGY:  No results found. ASSESSMENT AND PLAN:  73 year old woman PMH right breast ductal carcinoma in situ, status post right lumpectomy and undergoing radiation with last treatment approximately 4 days ago; hypertension, diabetes mellitus type 2, CKD stage IV, irritable bowel, tremor, presented with generalized weakness and confusion. Patient was found to have right breast mastitis.  Febrile illness secondary to right breast mastitis. -pt was diagnosed to have  right breast ductal carcinoma in situ in August 2020 on routine mammogram. She is status post right lumpectomy, currently undergoing radiation treatment; - followed by Drs. Chrystal, Finnegan and Cintron-Diaz. - Sepsis considered on admission but subsequently ruled out.  - Lactic acid was within normal limits and creatinine appears to be at baseline 1.7-1.9 - Procalcitonin 0.77.  Chest x-ray and urinalysis unremarkable.   -PCR Covid test negative.   -blood culture no growth in two days -urine culture  negative -respiratory PCR negative -Continue empiric antibiotics vancomycin, cefepime  -Dr. Windell Moment plans for incision and drainage since erythema is more localized with tenderness on the right breast  Leukocytosis -came in with white count of 15.4-- 11.2  Diabetes mellitus type 2 with diabetic retinopathy, neuropathy, CKD IV. - On 75 units Levemir, Trulicity, Amaryl, Lyrica as an outpatient. - decreased Levemir to 35 units nightly due to sugars stale and episode of hypoglycemia - Continue SSI; continue Lyrica. Hold Trulicity and Amaryl.  -Appreciate diabetes coordinator sample  CKD stage IV followed by Dr. Candiss Norse, on lisinopril as an outpatient.  - Baseline creatinine appears to be approximately 1.7-2.1. --appears to be at baseline --Continue to hold lisinopril-- stable. Will consider decreased dose if needed at discharge  Iron deficiency anemia, anemia of CKD --Remained stable, Continue iron  Lumbago with bilateral lower extremity numbness, suspect related to diabetic neuropathy --Vitamin B12 within normal limits    Family communication : patient updated. She reports her husband is updated as well Consults : Dr. Windell Moment surgery Discharge Disposition : home likely tomorrow CODE STATUS: DNR on admission DVT Prophylaxis : heparin  TOTAL TIME TAKING CARE OF THIS PATIENT: *30* minutes.  >50% time spent on counselling and coordination of care  POSSIBLE D/C IN *1 to 2* DAYS, DEPENDING ON CLINICAL CONDITION.  Note: This dictation was prepared with Dragon dictation along with smaller phrase technology. Any transcriptional errors that result from this process are unintentional.  Fritzi Mandes M.D on 03/24/2019 at 8:37 AM  Between 7am to 6pm - Pager - [phone]  After 6pm go to [email or web address]  [facility]   CC: Primary care physician; Lavera Guise, MDPatient ID: Kara [name], female   DOB: Feb 10, 1946, 73 y.o.   MRN: [id number]

## 2019-03-24 NOTE — Progress Notes (Signed)
[facility] Hospital Day(s): 3.   Post op day(s):  Marland Kitchen   Interval History: Patient seen and examined, no acute events or new complaints overnight. Patient reports severe pain of the right breast.  The pain does not radiate to other part of the body.  Aggravating factor is applying pressure to the lateral right breast.  There is no alleviating factor.  There has been no drainage.  Vital signs in last 24 hours: [min-max] current  Temp:  [98.2 F (36.8 C)-99.2 F (37.3 C)] 98.4 F (36.9 C) (12/02 0606) Pulse Rate:  [72-87] 83 (12/02 0606) Resp:  [18] 18 (12/02 0606) BP: (107-136)/(56-69) 133/58 (12/02 0606) SpO2:  [94 %-98 %] 96 % (12/02 0606)     Height: 5\' 3"  (160 cm) Weight: 101.4 kg BMI (Calculated): 39.61   Physical Exam:  Constitutional: alert, cooperative and no distress  Respiratory: breathing non-labored at rest  Cardiovascular: regular rate and sinus rhythm  Breast: Right breast, lateral aspect with severe tenderness on palpation, warm to touch and with blanching erythema.  There is overlying fluid collection.  Labs:  CBC Latest Ref Rng & Units 03/24/2019 03/23/2019 03/21/2019  WBC 4.0 - 10.5 K/uL 11.2(H) 15.4(H) 16.0(H)  Hemoglobin 12.0 - 15.0 g/dL 9.1(L) 9.2(L) 11.9(L)  Hematocrit 36.0 - 46.0 % 27.7(L) 29.9(L) 36.8  Platelets 150 - 400 K/uL 134(L) 125(L) 139(L)   CMP Latest Ref Rng & Units 03/24/2019 03/23/2019 03/21/2019  Glucose 70 - 99 mg/dL 124(H) 54(L) 232(H)  BUN 8 - 23 mg/dL 35(H) 39(H) 40(H)  Creatinine 0.44 - 1.00 mg/dL 1.77(H) 1.91(H) 2.17(H)  Sodium 135 - 145 mmol/L 142 141 139  Potassium 3.5 - 5.1 mmol/L 3.9 3.7 4.3  Chloride 98 - 111 mmol/L 112(H) 111 100  CO2 22 - 32 mmol/L 23 21(L) 25  Calcium 8.9 - 10.3 mg/dL 8.5(L) 8.4(L) 9.3  Total Protein 6.5 - 8.1 g/dL - - 7.7  Total Bilirubin 0.3 - 1.2 mg/dL - - 1.3(H)  Alkaline Phos 38 - 126 U/L - - 81  AST 15 - 41 U/L - - 24  ALT 0 - 44 U/L - - 21    Imaging studies: No new pertinent imaging  studies   Assessment/Plan:  73 y.o. female with right breast mastitis with possible infected seroma.     The patient has been responding to IV antibiotic therapy but now with more localized blanching erythema over the surgical area of the right breast.  This area is very tender to touch and warm.  Even though this changes can also be from radiation therapy the fact that the IV antibiotic therapy are localizing the erythema with persistent and worsening tenderness I consider that incision and drainage of the seroma is warranted to drain the most likely possible focus of infection.  Patient has work-up that includes chest x-ray and respiratory panel has been negative, no abdominal pain or diarrhea, urinalysis and urine culture are negative.  Blood cultures are negative.  There is no other focus of infection at this moment.  I think with the improvement of the white blood cell count and to perform incision and drainage, patient might be likely be able to change antibiotic to oral therapy and be discharged the next day.  Patient is n.p.o. and will be taken to OR today.  Arnold Long, MD

## 2019-03-24 NOTE — Care Management Important Message (Signed)
Important Message  Patient Details  Name: LAURI PURDUM MRN: [id number] Date of Birth: 07-10-1945   Medicare Important Message Given:  Yes     Dannette Barbara 03/24/2019, 10:59 AM

## 2019-03-24 NOTE — Anesthesia Postprocedure Evaluation (Signed)
Anesthesia Post Note  Patient: Kara [name]  Procedure(s) Performed: IRRIGATION AND DEBRIDEMENT ABSCESS RIGHT BREAST (Right Breast)  Patient location during evaluation: PACU Anesthesia Type: General Level of consciousness: awake and alert Pain management: pain level controlled Vital Signs Assessment: post-procedure vital signs reviewed and stable Respiratory status: spontaneous breathing, nonlabored ventilation, respiratory function stable and patient connected to nasal cannula oxygen Cardiovascular status: blood pressure returned to baseline and stable Postop Assessment: no apparent nausea or vomiting Anesthetic complications: no     Last Vitals:  Vitals:   03/24/19 1615 03/24/19 1624  BP: (!) 125/55   Pulse: 89   Resp: (!) 21   Temp: 36.7 C   SpO2: 94% 98%    Last Pain:  Vitals:   03/24/19 1615  TempSrc:   PainSc: 0-No pain                 Precious Haws Piscitello

## 2019-03-24 NOTE — Progress Notes (Signed)
Per MD okay RN to place NPO order. Pt will be going to the OR later for I&D.

## 2019-03-24 NOTE — Transfer of Care (Signed)
Immediate Anesthesia Transfer of Care Note  Patient: Kara [name]  Procedure(s) Performed: IRRIGATION AND DEBRIDEMENT ABSCESS RIGHT BREAST (Right Breast)  Patient Location: PACU  Anesthesia Type:General  Level of Consciousness: awake, alert  and oriented  Airway & Oxygen Therapy: Patient Spontanous Breathing and Patient connected to face mask oxygen  Post-op Assessment: Report given to RN and Post -op Vital signs reviewed and stable  Post vital signs: Reviewed and stable  Last Vitals:  Vitals Value Taken Time  BP 143/64 03/24/19 1530  Temp    Pulse 96 03/24/19 1530  Resp 19 03/24/19 1530  SpO2 100 % 03/24/19 1530    Last Pain:  Vitals:   03/24/19 1406  TempSrc: Temporal  PainSc: 8       Patients Stated Pain Goal: 3 (40/81/44 8185)  Complications: No apparent anesthesia complications

## 2019-03-24 NOTE — Progress Notes (Signed)
Inpatient Diabetes Program Recommendations  AACE/ADA: New Consensus Statement on Inpatient Glycemic Control (2015)  Target Ranges:  Prepandial:   less than 140 mg/dL      Peak postprandial:   less than 180 mg/dL (1-2 hours)      Critically ill patients:  140 - 180 mg/dL   Results for THEO, REITHER (MRN [id number]) as of 03/24/2019 12:52  Ref. Range 03/23/2019 07:52 03/23/2019 08:03 03/23/2019 08:34 03/23/2019 11:44 03/23/2019 16:56 03/23/2019 21:38  Glucose-Capillary Latest Ref Range: 70 - 99 mg/dL 52 (L) 58 (L) 82 136 (H)  2 units NOVOLOG  141 (H)  2 units NOVOLOG  161 (H)    35 units LEVEMIR given at 11pm   Results for JEYLI, ZWICKER (MRN [id number]) as of 03/24/2019 12:52  Ref. Range 03/24/2019 07:55 03/24/2019 11:17 03/24/2019 11:57  Glucose-Capillary Latest Ref Range: 70 - 99 mg/dL 94 64 (L) 106 (H)     Admit with: Febrile illness likely related to right breast infection/cellulitis  History: DM, CKD 4, Breast Cancer (getting Radiation)  Home DM Meds: Amaryl 4 mg BID                             Levemir 75 units QPM                             Trulicity 1.5 mg Qweek  Current Orders: Novolog Moderate Correction Scale/ SSI (0-15 units) TID AC                            Levemir 35 units QHS      MD--Note patient with Hypoglycemia yesterday AM after getting 50 units Levemir the night prior.  Levemir dose reduced to 35 units last PM (30% reduction).  Patient got 35 units Levemir last PM--CBG was 94 this AM but then dropped to 64 by 11am.  No Novolog given prior to the HYPO event.  Suspect the Levemir was the culprit.  Please consider reducing Levemir further to 28 units QHS (20% reduction)     --Will follow patient during hospitalization--  Wyn Quaker RN, MSN, CDE Diabetes Coordinator Inpatient Glycemic Control Team Team Pager: [phone] (8a-5p)

## 2019-03-25 ENCOUNTER — Encounter: Payer: Self-pay | Admitting: General Surgery

## 2019-03-25 ENCOUNTER — Ambulatory Visit: Payer: Medicare HMO | Admitting: Oncology

## 2019-03-25 ENCOUNTER — Ambulatory Visit: Payer: Medicare HMO

## 2019-03-25 DIAGNOSIS — N61 Mastitis without abscess: Secondary | ICD-10-CM | POA: Diagnosis not present

## 2019-03-25 LAB — GLUCOSE, CAPILLARY
Glucose-Capillary: 172 mg/dL — ABNORMAL HIGH (ref 70–99)
Glucose-Capillary: 179 mg/dL — ABNORMAL HIGH (ref 70–99)

## 2019-03-25 LAB — CREATININE, SERUM
Creatinine, Ser: 2.12 mg/dL — ABNORMAL HIGH (ref 0.44–1.00)
GFR calc Af Amer: 26 mL/min — ABNORMAL LOW (ref >60)
GFR calc non Af Amer: 23 mL/min — ABNORMAL LOW (ref >60)

## 2019-03-25 MED ORDER — AMOXICILLIN-POT CLAVULANATE 875-125 MG PO TABS: 1.0000 | ORAL_TABLET | Freq: Two times a day (BID) | ORAL | 0 refills | Status: DC

## 2019-03-25 MED ORDER — AMOXICILLIN-POT CLAVULANATE 875-125 MG PO TABS
1.0000 | ORAL_TABLET | Freq: Two times a day (BID) | ORAL | Status: DC
  Administered 2019-03-25: 1 via ORAL
  Filled 2019-03-25: qty 1

## 2019-03-25 MED ORDER — SODIUM CHLORIDE 0.9 % IV SOLN
2.0000 g | INTRAVENOUS | Status: DC
  Filled 2019-03-25: qty 2

## 2019-03-25 MED ORDER — LEVEMIR FLEXTOUCH 100 UNIT/ML ~~LOC~~ SOPN: 20.0000 [IU] | PEN_INJECTOR | Freq: Every day | SUBCUTANEOUS | 3 refills | Status: DC

## 2019-03-25 NOTE — Discharge Summary (Signed)
[place] [facility] [place] NAME: Kara [name]    MR#:  [id number]  DATE OF BIRTH:  1945-12-26  DATE OF ADMISSION:  03/21/2019 ADMITTING PHYSICIAN: Orene Desanctis, DO  DATE OF DISCHARGE: 03/25/2019  PRIMARY CARE PHYSICIAN: Lavera Guise, MD    ADMISSION DIAGNOSIS:  Generalized weakness [R53.1] AKI (acute kidney injury) ([place]) [N17.9] Sepsis, due to unspecified organism, unspecified whether acute organ dysfunction present [facility]) [A41.9]  DISCHARGE DIAGNOSIS:  right breast abscess status post incision and drainage on second December leukocytosis improved SECONDARY DIAGNOSIS:   Past Medical History:  Diagnosis Date  . Anemia   . Arthropathy   . Benign neoplasm of breast   . Chest pain   . Chronic kidney disease    30% kidney function  . Diabetes ([place])   . Diabetic retinopathy ([place])   . Dyspnea   . Edema   . History of kidney stones   . Hyperlipemia   . Hypersomnia with sleep apnea   . Hypertension   . 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 Lumbago   . Osteoarthrosis   . Ovarian failure   . SOB (shortness of breath)     HOSPITAL COURSE:  73 year old woman PMH right breast ductal carcinoma in situ, status post right lumpectomy and undergoing radiation with last treatment approximately 4 days ago; hypertension, diabetes mellitus type 2, CKD stage IV, irritable bowel, tremor, presented with generalized weakness and confusion. Patient was found to have right breast mastitis.  *Acute  right breast mastitis/Abcess s/p I and D on 03/24/2019 -pt was diagnosed to have  right breast ductal carcinoma in situ in August 2020 on routine mammogram. She is status post right lumpectomy, currently undergoing radiation treatment; - followed by Drs. Chrystal, Finnegan and Cintron-Diaz. -Sepsis considered on admission but subsequently ruled out.  -Lactic acid was within normal limits and creatinine appears to be at baseline 1.7-1.9 - Procalcitonin  0.77. Chest x-ray and urinalysis unremarkable.  -PCR Covid test negative.  -blood culture no growth in two days -urine culture negative -respiratory PCR negative -Continueempiric antibiotics vancomycin, cefepime --change to po augmentin (10 days) -patient has right breast packing present. Home health RN will be arranged to help out with packing  *Leukocytosis -came in with white count of 15.4-- 11.2  *Diabetes mellitus type 2 with diabetic retinopathy, neuropathy, CKD IV. -On 75 units Levemir, Trulicity, Amaryl, Lyrica as an outpatient. - decreased Levemir to 20 units nightly due to sugars stable and episode of hypoglycemia and not eating too good here -ContinueSSI; continueLyrica. - Hold Trulicity and Amaryl.--pt aware  CKD stage IV followed by Dr. Candiss Norse, on lisinopril as an outpatient.  -Baseline creatinine appears to be approximately 1.7-2.1. --appears to be at baseline --Continue to holdlisinopril-- patient aware. It can be resumed as outpatient once blood pressure trends upwards.  HTN -continue verapamil -hold Lasix and lisinopril for now. Defer to primary care physician as outpatient resume once blood pressure starts trending up  Iron deficiency anemia, anemia of CKD --Remained stable, Continue iron  Lumbago with bilateral lower extremity numbness, suspect related to diabetic neuropathy --Vitamin B12 within normal limits    Family communication : patient updated. She reports her husband is updated as well Consults : Dr. Windell Moment surgery, Dr. Grayland Ormond oncology Discharge Disposition : home with home health RN  CODE STATUS: DNR on admission DVT Prophylaxis : heparin  Patient will discharged home after seen by surgery. Home health will be arranged for dressing changes. Patient  agreeable.   CONSULTS OBTAINED:  Treatment Team:  Herbert Pun, MD  DRUG ALLERGIES:  No Known Allergies  DISCHARGE MEDICATIONS:   Allergies as of 03/25/2019   No  Known Allergies     Medication List    STOP taking these medications   furosemide 20 MG tablet Commonly known as: LASIX   glimepiride 4 MG tablet Commonly known as: AMARYL   lisinopril 40 MG tablet Commonly known as: ZESTRIL   Trulicity 1.5 ZO/1.0RU Sopn Generic drug: Dulaglutide     TAKE these medications   amoxicillin-clavulanate 875-125 MG tablet Commonly known as: AUGMENTIN Take 1 tablet by mouth every 12 (twelve) hours.   APPLE CIDER VINEGAR PO Take 2 tablets by mouth daily.   aspirin EC 81 MG tablet Take 81 mg by mouth daily.   cholecalciferol 1000 units tablet Commonly known as: VITAMIN D Take 2,000 Units by mouth daily.   Ferrous Gluconate 324 (37.5 Fe) MG Tabs Take 324 mg by mouth 2 (two) times daily.   Levemir FlexTouch 100 UNIT/ML Pen Generic drug: Insulin Detemir Inject 20 Units into the skin daily after supper. What changed: how much to take   multivitamin tablet Take 1 tablet by mouth daily at 12 noon.   pantoprazole 40 MG tablet Commonly known as: PROTONIX TAKE 1 TABLET(S) BY MOUTH DAILY   pregabalin 100 MG capsule Commonly known as: LYRICA Take 1 capsule (100 mg total) by mouth 3 (three) times daily. What changed: when to take this   rosuvastatin 20 MG tablet Commonly known as: CRESTOR TAKE 1 TABLET BY MOUTH EVERYDAY AT BEDTIME   traMADol-acetaminophen 37.5-325 MG tablet Commonly known as: ULTRACET Take 1 tablet by mouth 2 (two) times daily as needed for moderate pain.   verapamil 40 MG tablet Commonly known as: CALAN TAKE 1 TABLET(S) BY MOUTH TWICE A DAY FOR HYPERTENSION What changed:   how much to take  how to take this  when to take this  additional instructions       If you experience worsening of your admission symptoms, develop shortness of breath, life threatening emergency, suicidal or homicidal thoughts you must seek medical attention immediately by calling 911 or calling your MD immediately  if symptoms less  severe.  You Must read complete instructions/literature along with all the possible adverse reactions/side effects for all the Medicines you take and that have been prescribed to you. Take any new Medicines after you have completely understood and accept all the possible adverse reactions/side effects.   Please note  You were cared for by a hospitalist during your hospital stay. If you have any questions about your discharge medications or the care you received while you were in the hospital after you are discharged, you can call the unit and asked to speak with the hospitalist on call if the hospitalist that took care of you is not available. Once you are discharged, your primary care physician will handle any further medical issues. Please note that NO REFILLS for any discharge medications will be authorized once you are discharged, as it is imperative that you return to your primary care physician (or establish a relationship with a primary care physician if you do not have one) for your aftercare needs so that they can reassess your need for medications and monitor your lab values. Today   SUBJECTIVE   Denies any pain on the right breast at present. Feels overall okay. Appetite slowly picking  VITAL SIGNS:  Blood pressure (!) 116/57, pulse 63, temperature 97.9  F (36.6 C), temperature source Oral, resp. rate 18, height 5\' 3"  (1.6 m), weight 101.4 kg, SpO2 93 %.  I/O:    Intake/Output Summary (Last 24 hours) at 03/25/2019 1023 Last data filed at 03/25/2019 1021 Gross per 24 hour  Intake 1595.02 ml  Output 465 ml  Net 1130.02 ml    PHYSICAL EXAMINATION:  GENERAL:  73 y.o.-year-old patient lying in the bed with no acute distress. Obese EYES: Pupils equal, round, reactive to light and accommodation. No scleral icterus. Extraocular muscles intact.  HEENT: Head atraumatic, normocephalic. Oropharynx and nasopharynx clear.  NECK:  Supple, no jugular venous distention. No thyroid  enlargement, no tenderness.  LUNGS: Normal breath sounds bilaterally, no wheezing, rales,rhonchi or crepitation. No use of accessory muscles of respiration.  CARDIOVASCULAR: S1, S2 normal. No murmurs, rubs, or gallops.  ABDOMEN: Soft, non-tender, non-distended. Bowel sounds present. No organomegaly or mass.  EXTREMITIES: No pedal edema, cyanosis, or clubbing.  NEUROLOGIC: Cranial nerves II through XII are intact. Muscle strength 5/5 in all extremities. Sensation intact. Gait not checked.  PSYCHIATRIC: The patient is alert and oriented x 3.  SKIN: mild cellulitis over the right breast. Surgical dressing present.  DATA REVIEW:   CBC  Recent Labs  Lab 03/24/19 0511  WBC 11.2*  HGB 9.1*  HCT 27.7*  PLT 134*    Chemistries  Recent Labs  Lab 03/21/19 1450  03/24/19 0511 03/25/19 0541  NA 139   < > 142  --   K 4.3   < > 3.9  --   CL 100   < > 112*  --   CO2 25   < > 23  --   GLUCOSE 232*   < > 124*  --   BUN 40*   < > 35*  --   CREATININE 2.17*   < > 1.77* 2.12*  CALCIUM 9.3   < > 8.5*  --   AST 24  --   --   --   ALT 21  --   --   --   ALKPHOS 81  --   --   --   BILITOT 1.3*  --   --   --    < > = values in this interval not displayed.    Microbiology Results   Recent Results (from the past 240 hour(s))  Blood Culture (routine x 2)     Status: None (Preliminary result)   Collection Time: 03/21/19  2:50 PM   Specimen: BLOOD  Result Value Ref Range Status   Specimen Description BLOOD RIGHT ASSIST CONTROL  Final   Special Requests   Final    BOTTLES DRAWN AEROBIC AND ANAEROBIC Blood Culture adequate volume   Culture   Final    NO GROWTH 4 DAYS Performed at [facility], [address] [place], [place] [address]    Report Status PENDING  Incomplete  Blood Culture (routine x 2)     Status: None (Preliminary result)   Collection Time: 03/21/19  2:50 PM   Specimen: BLOOD  Result Value Ref Range Status   Specimen Description BLOOD BLOOD RIGHT FOREARM  Final    Special Requests   Final    BOTTLES DRAWN AEROBIC AND ANAEROBIC Blood Culture adequate volume   Culture   Final    NO GROWTH 4 DAYS Performed at [facility], [place], [place] [address]    Report Status PENDING  Incomplete  SARS CORONAVIRUS 2 (TAT 6-24 HRS) Nasopharyngeal Nasopharyngeal  Swab     Status: None   Collection Time: 03/21/19  4:15 PM   Specimen: Nasopharyngeal Swab  Result Value Ref Range Status   SARS Coronavirus 2 NEGATIVE NEGATIVE Final    Comment: (NOTE) SARS-CoV-2 target nucleic acids are NOT DETECTED. The SARS-CoV-2 RNA is generally detectable in upper and lower respiratory specimens during the acute phase of infection. Negative results do not preclude SARS-CoV-2 infection, do not rule out co-infections with other pathogens, and should not be used as the sole basis for treatment or other patient management decisions. Negative results must be combined with clinical observations, patient history, and epidemiological information. The expected result is Negative. Fact Sheet for Patients: SugarRoll.be Fact Sheet for Healthcare Providers: https://www.woods-mathews.com/ This test is not yet approved or cleared by the [place] FDA and  has been authorized for detection and/or diagnosis of SARS-CoV-2 by FDA under an Emergency Use Authorization (EUA). This EUA will remain  in effect (meaning this test can be used) for the duration of the COVID-19 declaration under Section 56 4(b)(1) of the Act, 21 U.S.C. section 360bbb-3(b)(1), unless the authorization is terminated or revoked sooner. Performed at [facility], [place] [address], [place], [place] [address]   Urine culture     Status: None   Collection Time: 03/21/19  5:51 PM   Specimen: In/Out Cath Urine  Result Value Ref Range Status   Specimen Description   Final    IN/OUT CATH URINE Performed at [facility], [address] [place], [place] [address]    Special Requests   Final    NONE Performed at [facility], [address], [place], [place] [address]    Culture   Final    NO GROWTH Performed at [facility], [place] [address], [place], [place] [address]    Report Status 03/22/2019 FINAL  Final  Respiratory Panel by PCR     Status: None   Collection Time: 03/22/19  8:15 AM   Specimen: Nasopharyngeal Swab; Respiratory  Result Value Ref Range Status   Adenovirus NOT DETECTED NOT DETECTED Final   Coronavirus 229E NOT DETECTED NOT DETECTED Final    Comment: (NOTE) The Coronavirus on the Respiratory Panel, DOES NOT test for the novel  Coronavirus (2019 nCoV)    Coronavirus HKU1 NOT DETECTED NOT DETECTED Final   Coronavirus NL63 NOT DETECTED NOT DETECTED Final   Coronavirus OC43 NOT DETECTED NOT DETECTED Final   Metapneumovirus NOT DETECTED NOT DETECTED Final   Rhinovirus / Enterovirus NOT DETECTED NOT DETECTED Final   Influenza A NOT DETECTED NOT DETECTED Final   Influenza B NOT DETECTED NOT DETECTED Final   Parainfluenza Virus 1 NOT DETECTED NOT DETECTED Final   Parainfluenza Virus 2 NOT DETECTED NOT DETECTED Final   Parainfluenza Virus 3 NOT DETECTED NOT DETECTED Final   Parainfluenza Virus 4 NOT DETECTED NOT DETECTED Final   Respiratory Syncytial Virus NOT DETECTED NOT DETECTED Final   Bordetella pertussis NOT DETECTED NOT DETECTED Final   Chlamydophila pneumoniae NOT DETECTED NOT DETECTED Final   Mycoplasma pneumoniae NOT DETECTED NOT DETECTED Final    Comment: Performed at [facility], [place] [address], [place], [place] [address]  Aerobic/Anaerobic Culture (surgical/deep wound)     Status: None (Preliminary result)   Collection Time: 03/24/19  2:52 PM   Specimen: PATH Other; Tissue  Result Value Ref Range Status   Specimen Description   Final    ABSCESS Performed at [facility], [address] [place], [place] [address]    Special Requests RIGHT BREAST  Final    Gram Stain   Final    NO WBC SEEN NO ORGANISMS SEEN Performed at [facility], [place] [address], [place], [place] [address]    Culture PENDING  Incomplete   Report Status PENDING  Incomplete    RADIOLOGY:  No results found.   CODE STATUS:     Code Status Orders  (From admission, onward)         Start     Ordered   03/21/19 1926  Do not attempt resuscitation (DNR)  Continuous    Question Answer Comment  In the event of cardiac or respiratory ARREST Do not call a "code blue"   In the event of cardiac or respiratory ARREST Do not perform Intubation, CPR, defibrillation or ACLS   In the event of cardiac or respiratory ARREST Use medication by any route, position, wound care, and other measures to relive pain and suffering. May use oxygen, suction and manual treatment of airway obstruction as needed for comfort.      03/21/19 1925        Code Status History    This patient has a current code status but no historical code status.   Advance Care Planning Activity    Advance Directive Documentation     Most Recent Value  Type of Advance Directive  Healthcare Power of Attorney, Living will  Pre-existing out of facility DNR order (yellow form or pink MOST form)  -  "MOST" Form in Place?  -     TOTAL TIME TAKING CARE OF THIS PATIENT: *40* minutes.    Fritzi Mandes M.D on 03/25/2019 at 10:23 AM  Between 7am to 6pm - Pager - [phone] After 6pm go to [email or web address] - password [email or web address]  [facility]    CC: Primary care physician; Lavera Guise, MD

## 2019-03-25 NOTE — Progress Notes (Signed)
[facility] Hospital Day(s): 4.   Post op day(s): 1 Day Post-Op.   Interval History: Patient seen and examined, no acute events or new complaints overnight. Patient reports feeling much better today.  She reports pain on the right breast is tolerable.  She denies fever or chills.  She has had significant drainage from the wound but has been controlled with dressing changes.  Vital signs in last 24 hours: [min-max] current  Temp:  [97.9 F (36.6 C)-98.8 F (37.1 C)] 97.9 F (36.6 C) (12/03 0510) Pulse Rate:  [63-96] 63 (12/03 0510) Resp:  [18-21] 18 (12/03 0510) BP: (106-144)/(48-67) 116/57 (12/03 0803) SpO2:  [81 %-100 %] 93 % (12/03 0510)     Height: 5\' 3"  (160 cm) Weight: 101.4 kg BMI (Calculated): 39.61   Physical Exam:  Constitutional: alert, cooperative and no distress  Respiratory: breathing non-labored at rest  Cardiovascular: regular rate and sinus rhythm  Breast: Right breast open wound with serosanguineous drainage.  Decreasing erythema.  Improved skin thickness.  Labs:  CBC Latest Ref Rng & Units 03/24/2019 03/23/2019 03/21/2019  WBC 4.0 - 10.5 K/uL 11.2(H) 15.4(H) 16.0(H)  Hemoglobin 12.0 - 15.0 g/dL 9.1(L) 9.2(L) 11.9(L)  Hematocrit 36.0 - 46.0 % 27.7(L) 29.9(L) 36.8  Platelets 150 - 400 K/uL 134(L) 125(L) 139(L)   CMP Latest Ref Rng & Units 03/25/2019 03/24/2019 03/23/2019  Glucose 70 - 99 mg/dL - 124(H) 54(L)  BUN 8 - 23 mg/dL - 35(H) 39(H)  Creatinine 0.44 - 1.00 mg/dL 2.12(H) 1.77(H) 1.91(H)  Sodium 135 - 145 mmol/L - 142 141  Potassium 3.5 - 5.1 mmol/L - 3.9 3.7  Chloride 98 - 111 mmol/L - 112(H) 111  CO2 22 - 32 mmol/L - 23 21(L)  Calcium 8.9 - 10.3 mg/dL - 8.5(L) 8.4(L)  Total Protein 6.5 - 8.1 g/dL - - -  Total Bilirubin 0.3 - 1.2 mg/dL - - -  Alkaline Phos 38 - 126 U/L - - -  AST 15 - 41 U/L - - -  ALT 0 - 44 U/L - - -    Imaging studies: No new pertinent imaging studies   Assessment/Plan:  73 y.o. female with right breast abscess 1  Day Post-Op s/p mastectomy with drainage of a large breast abscess, complicated by pertinent comorbidities including right breast cancer status post partial mastectomy currently in radiation therapy. Patient tolerated procedure well.  Recovering properly.  Significant improvement of the pain.  Patient oriented about dressing changes.  Home health is currently being coordinated for assistance.  No further surgical management at this moment.  I will follow her in my office in a week.  Arnold Long, MD

## 2019-03-25 NOTE — Discharge Instructions (Addendum)
Right Breast  dressing changes per Dr. Windell Moment    Antibiotic Medicine, Adult  Antibiotic medicines treat infections caused by a type of germ called bacteria. They work by killing the bacteria that make you sick. When do I need to take antibiotics? You often need these medicines to treat bacterial infections, such as:  A urinary tract infection (UTI).  Strep throat.  Meningitis. This affects the spinal cord and brain.  A bad lung infection. You may start the medicines while your doctor waits for tests to come back. When the tests come back, your doctor may change or stop your medicine. When are antibiotics not needed? You do not need these medicines for most common illnesses, such as:  A cold.  The flu.  A sore throat. Antibiotics are not always needed for all infections caused by bacteria. Do not ask for these medicines, or take them, when they are not needed. What are the risks of taking antibiotics? Most antibiotics can cause an infection called Clostridioides difficile (C. diff). This causes watery poop (diarrhea). Let your doctor know right away if:  You have watery poop while taking an antibiotic.  You have watery poop after you stop taking an antibiotic. The illness can happen weeks after you stop the medicine. You also have a risk of getting an infection in the future that antibiotics cannot treat (antibiotic-resistant infection). This type of infection can be dangerous. What else should I know about taking antibiotics?   You need to take the entire prescription. ? Take the medicine for as long as told by your doctor. ? Do not stop taking it even if you start to feel better.  Try not to miss any doses. If you miss a dose, call your doctor.  Birth control pills may not work. If you take birth control pills: ? Keep on taking them. ? Use a second form of birth control, such as a condom. Do this for as long as told by your doctor.  Ask your doctor: ? How long  to wait in between doses. ? If you should take the medicine with food. ? If there is anything you should stay away from while taking the antibiotic, such as: ? Food. ? Drinks. ? Medicines. ? If there are any side effects you should watch for.  Only take the medicines that your doctor told you to take. Do not take medicines that were given to someone else.  Drink a large glass of water with the medicine.  Ask the pharmacist for a tool to measure the medicine, such as: ? A syringe. ? A cup. ? A spoon.  Throw away any extra medicine. Contact a doctor if:  You get worse.  You have new joint pain or muscle aches after starting the medicine.  You have side effects from the medicine, such as: ? Stomach pain. ? Watery poop. ? Feeling sick to your stomach (nausea). Get help right away if:  You have signs of a very bad allergic reaction. If this happens, stop taking the medicine right away. Signs may include: ? Hives. These are raised, itchy, red bumps on the skin. ? Skin rash. ? Trouble breathing. ? Wheezing. ? Swelling. ? Feeling dizzy. ? Throwing up (vomiting).  Your pee (urine) is dark, or is the color of blood.  Your skin turns yellow.  You bruise easily.  You bleed easily.  You have very bad watery poop and cramps in your belly.  You have a very bad headache. Summary  Antibiotics are often used to treat infections caused by bacteria.  Only take these medicines when needed.  Let your doctor know if you have watery poop while taking an antibiotic.  You need to take the entire prescription. This information is not intended to replace advice given to you by your health care provider. Make sure you discuss any questions you have with your health care provider. Document Released: 01/16/2008 Document Revised: 05/15/2018 Document Reviewed: 04/10/2016 Elsevier Patient Education  2020 [employer].

## 2019-03-25 NOTE — TOC Transition Note (Signed)
Transition of Care [facility]) - CM/SW Discharge Note   Patient Details  Name: CYTHIA BACHTEL MRN: [id number] Date of Birth: 1945/06/04  [facility] CM/SW Contact:  Beverly Sessions, RN Phone Number: 03/25/2019, 4:34 PM   Clinical Narrative:    Patient admitted from home with cellulitis of breast  Patient lives at home with husband  PCP "Dr Nira Conn"  She does not recall MDs last name  Pharmacy [facility] - denies issues obtaining medications  Patient states that she has a RW at home but does not use it at baseline  Patient will need home health for dressing changes and PT.  Patient agreeable to home health services, and states that she does not have a preference of home health agency.  Referral made to [facility] with [facility]. [place] RN to provide dressing change education to husband prior to discharge    Final next level of care: [place] Barriers to Discharge: No Barriers Identified   Patient Goals and CMS Choice        Discharge Placement                       Discharge Plan and Services   Discharge Planning Services: CM Consult Post Acute Care Choice: Home Health                    HH Arranged: RN, PT [facility] Agency: [place] Date [facility] Agency Contacted: 03/25/19   Representative spoke with at [place]: [place] ([place]) Interventions     Readmission Risk Interventions No flowsheet data found.

## 2019-03-25 NOTE — Progress Notes (Signed)
[name] to be D/C'd home with husband per MD order.  Discussed prescriptions and follow up appointments with the patient. Prescriptions given to patient, medication list explained in detail. Pt verbalized understanding.  Allergies as of 03/25/2019   No Known Allergies      Medication List     STOP taking these medications    furosemide 20 MG tablet Commonly known as: LASIX   glimepiride 4 MG tablet Commonly known as: AMARYL   lisinopril 40 MG tablet Commonly known as: ZESTRIL   Trulicity 1.5 LA/4.5XM Sopn Generic drug: Dulaglutide       TAKE these medications    amoxicillin-clavulanate 875-125 MG tablet Commonly known as: AUGMENTIN Take 1 tablet by mouth every 12 (twelve) hours.   APPLE CIDER VINEGAR PO Take 2 tablets by mouth daily.   aspirin EC 81 MG tablet Take 81 mg by mouth daily.   cholecalciferol 1000 units tablet Commonly known as: VITAMIN D Take 2,000 Units by mouth daily.   Ferrous Gluconate 324 (37.5 Fe) MG Tabs Take 324 mg by mouth 2 (two) times daily.   Levemir FlexTouch 100 UNIT/ML Pen Generic drug: Insulin Detemir Inject 20 Units into the skin daily after supper. What changed: how much to take   multivitamin tablet Take 1 tablet by mouth daily at 12 noon.   pantoprazole 40 MG tablet Commonly known as: PROTONIX TAKE 1 TABLET(S) BY MOUTH DAILY   pregabalin 100 MG capsule Commonly known as: LYRICA Take 1 capsule (100 mg total) by mouth 3 (three) times daily. What changed: when to take this   rosuvastatin 20 MG tablet Commonly known as: CRESTOR TAKE 1 TABLET BY MOUTH EVERYDAY AT BEDTIME   traMADol-acetaminophen 37.5-325 MG tablet Commonly known as: ULTRACET Take 1 tablet by mouth 2 (two) times daily as needed for moderate pain.   verapamil 40 MG tablet Commonly known as: CALAN TAKE 1 TABLET(S) BY MOUTH TWICE A DAY FOR HYPERTENSION What changed:  how much to take how to take this when to take this additional instructions         Vitals:   03/25/19 0803 03/25/19 1137  BP: (!) 116/57 (!) 111/49  Pulse:  63  Resp:  12  Temp:  98.2 F (36.8 C)  SpO2:  98%    Skin clean, dry and intact without evidence of skin break down, no evidence of skin tears noted. IV catheter discontinued intact. Site without signs and symptoms of complications. Dressing and pressure applied. Pt denies pain at this time. No complaints noted.  An After Visit Summary was printed and given to the patient. Patient escorted via [place], and D/C home via private auto.  [place] A Addalee Kavanagh

## 2019-03-26 ENCOUNTER — Encounter: Payer: Self-pay | Admitting: Oncology

## 2019-03-26 ENCOUNTER — Ambulatory Visit: Payer: Medicare HMO

## 2019-03-26 ENCOUNTER — Telehealth: Payer: Self-pay

## 2019-03-26 LAB — CULTURE, BLOOD (ROUTINE X 2)
Culture: NO GROWTH
Culture: NO GROWTH
Special Requests: ADEQUATE
Special Requests: ADEQUATE

## 2019-03-26 NOTE — Progress Notes (Signed)
Patient prescreened for appointment. Patient has no concerns or questions.  

## 2019-03-26 NOTE — Telephone Encounter (Signed)
CONFIRMED 03-30-19 AS VIRTUAL PT JUST HAD SX REQUESTED VIRTUAL.

## 2019-03-27 DIAGNOSIS — C50911 Malignant neoplasm of unspecified site of right female breast: Secondary | ICD-10-CM | POA: Diagnosis not present

## 2019-03-27 DIAGNOSIS — E1122 Type 2 diabetes mellitus with diabetic chronic kidney disease: Secondary | ICD-10-CM | POA: Diagnosis not present

## 2019-03-27 DIAGNOSIS — D631 Anemia in chronic kidney disease: Secondary | ICD-10-CM | POA: Diagnosis not present

## 2019-03-27 DIAGNOSIS — N179 Acute kidney failure, unspecified: Secondary | ICD-10-CM | POA: Diagnosis not present

## 2019-03-27 DIAGNOSIS — N611 Abscess of the breast and nipple: Secondary | ICD-10-CM | POA: Diagnosis not present

## 2019-03-27 DIAGNOSIS — D509 Iron deficiency anemia, unspecified: Secondary | ICD-10-CM | POA: Diagnosis not present

## 2019-03-27 DIAGNOSIS — I10 Essential (primary) hypertension: Secondary | ICD-10-CM | POA: Diagnosis not present

## 2019-03-27 DIAGNOSIS — E1140 Type 2 diabetes mellitus with diabetic neuropathy, unspecified: Secondary | ICD-10-CM | POA: Diagnosis not present

## 2019-03-27 DIAGNOSIS — N184 Chronic kidney disease, stage 4 (severe): Secondary | ICD-10-CM | POA: Diagnosis not present

## 2019-03-28 DIAGNOSIS — E1140 Type 2 diabetes mellitus with diabetic neuropathy, unspecified: Secondary | ICD-10-CM | POA: Diagnosis not present

## 2019-03-28 DIAGNOSIS — D509 Iron deficiency anemia, unspecified: Secondary | ICD-10-CM | POA: Diagnosis not present

## 2019-03-28 DIAGNOSIS — E1122 Type 2 diabetes mellitus with diabetic chronic kidney disease: Secondary | ICD-10-CM | POA: Diagnosis not present

## 2019-03-28 DIAGNOSIS — I10 Essential (primary) hypertension: Secondary | ICD-10-CM | POA: Diagnosis not present

## 2019-03-28 DIAGNOSIS — N179 Acute kidney failure, unspecified: Secondary | ICD-10-CM | POA: Diagnosis not present

## 2019-03-28 DIAGNOSIS — D631 Anemia in chronic kidney disease: Secondary | ICD-10-CM | POA: Diagnosis not present

## 2019-03-28 DIAGNOSIS — N611 Abscess of the breast and nipple: Secondary | ICD-10-CM | POA: Diagnosis not present

## 2019-03-28 DIAGNOSIS — C50911 Malignant neoplasm of unspecified site of right female breast: Secondary | ICD-10-CM | POA: Diagnosis not present

## 2019-03-28 DIAGNOSIS — N184 Chronic kidney disease, stage 4 (severe): Secondary | ICD-10-CM | POA: Diagnosis not present

## 2019-03-28 NOTE — Progress Notes (Signed)
Kara [name]  [phone] [phone]) [phone]  ID: Kara [name] OB: 1945/08/08  MR#: [id number]  CLE#:[id number]  Patient Care Team: Lavera Guise, MD as PCP - General (Internal Medicine)  CHIEF COMPLAINT: [name]-grade DCIS of the right breast.  INTERVAL HISTORY: Patient returns to clinic today as an add-on for hospital follow-up and to discuss discontinuing XRT.  She was recently admitted to the hospital and found to have a right breast abscess requiring surgical intervention.  She states she is extremely upset how she was treated by radiation oncology and refuses to return or continue XRT.  We discussed the possibility of transferring care to another facility which she also declined.  She otherwise feels well.  She has no neurologic complaints.  She denies any recent fevers.  She has a good appetite and denies weight loss.  She has no chest pain, shortness of breath, cough, or hemoptysis.  She denies any nausea, vomiting, constipation, or diarrhea.  She has no urinary complaints.  Patient offers no further specific complaints today.  REVIEW OF SYSTEMS:   Review of Systems  Constitutional: Negative.  Negative for fever, malaise/fatigue and weight loss.  Respiratory: Negative.  Negative for cough, hemoptysis and shortness of breath.   Cardiovascular: Negative.  Negative for chest pain and leg swelling.  Gastrointestinal: Negative.  Negative for abdominal pain.  Genitourinary: Negative.  Negative for dysuria.  Musculoskeletal: Negative.  Negative for back pain.  Skin: Negative.  Negative for rash.  Neurological: Negative.  Negative for dizziness, focal weakness, weakness and headaches.  Psychiatric/Behavioral: The patient is nervous/anxious.     As per HPI. Otherwise, a complete review of systems is negative.  PAST MEDICAL HISTORY: Past Medical History:  Diagnosis Date  . Anemia   . Arthropathy   . Benign neoplasm of breast   . Chest pain   . Chronic  kidney disease    30% kidney function  . Diabetes ([place])   . Diabetic retinopathy ([place])   . Dyspnea   . Edema   . History of kidney stones   . Hyperlipemia   . Hypersomnia with sleep apnea   . Hypertension   . 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 Lumbago   . Osteoarthrosis   . Ovarian failure   . SOB (shortness of breath)     PAST SURGICAL HISTORY: Past Surgical History:  Procedure Laterality Date  . BILATERAL SALPINGECTOMY    . BREAST BIOPSY Right 2014   neg- core  . BREAST BIOPSY Right 12/21/2018   [place] bx, venus clip,  DUCTAL CARCINOMA IN SITU  . BREAST BIOPSY Right 12/21/2018   [place] bx, ribbon clip,  FIBROEPITHELIAL PROLIFERATION WITH SCLEROSIS  . BREAST BIOPSY Right 01/01/2019   Affirm bx "X" clip-path pending  . BREAST DUCTAL SYSTEM EXCISION    . BREAST EXCISIONAL BIOPSY Right 2014   neg  . BREAST LUMPECTOMY Right 01/22/2019   path pending  . CATARACT EXTRACTION    . CHOLECYSTECTOMY    . CYSTOSCOPY WITH STENT PLACEMENT Left 04/14/2015   Procedure: CYSTOSCOPY WITH STENT PLACEMENT;  Surgeon: Hollice Espy, MD;  Location: [facility];  Service: Urology;  Laterality: Left;  . EXTRACORPOREAL SHOCK WAVE LITHOTRIPSY Left 09/29/2014   Procedure: EXTRACORPOREAL SHOCK WAVE LITHOTRIPSY (ESWL);  Surgeon: Hollice Espy, MD;  Location: [facility];  Service: Urology;  Laterality: Left;  . EYE SURGERY     bilateral cataract  . IRRIGATION AND DEBRIDEMENT ABSCESS Right 03/24/2019   Procedure: IRRIGATION AND DEBRIDEMENT ABSCESS RIGHT  BREAST;  Surgeon: Herbert Pun, MD;  Location: [facility];  Service: General;  Laterality: Right;  . LAPAROSCOPIC OOPHERECTOMY Left   . PARTIAL MASTECTOMY WITH NEEDLE LOCALIZATION Right 01/22/2019   Procedure: PARTIAL MASTECTOMY WITH NEEDLE LOCALIZATION;  Surgeon: Herbert Pun, MD;  Location: [facility];  Service: General;  Laterality: Right;  . TONSILLECTOMY    . TUBAL LIGATION    . URETEROSCOPY WITH HOLMIUM LASER LITHOTRIPSY Left 04/14/2015    Procedure: URETEROSCOPY WITH HOLMIUM LASER LITHOTRIPSY;  Surgeon: Hollice Espy, MD;  Location: [facility];  Service: Urology;  Laterality: Left;    FAMILY HISTORY: Family History  Problem Relation Age of Onset  . Breast cancer Maternal Grandmother 2  . Colon cancer Mother   . Lung cancer Father     ADVANCED DIRECTIVES (Y/N):  N  HEALTH MAINTENANCE: Social History   Tobacco Use  . Smoking status: Former Smoker    Packs/day: 1.00    Years: 30.00    Pack years: 30.00    Quit date: 04/11/1995    Years since quitting: 23.9  . Smokeless tobacco: Never Used  Substance Use Topics  . Alcohol use: No    Alcohol/week: 0.0 standard drinks  . Drug use: No     Colonoscopy:  PAP:  Bone density:  Lipid panel:  No Known Allergies  Current Outpatient Medications  Medication Sig Dispense Refill  . amoxicillin-clavulanate (AUGMENTIN) 875-125 MG tablet Take 1 tablet by mouth every 12 (twelve) hours. 12 tablet 0  . APPLE CIDER VINEGAR PO Take 2 tablets by mouth daily.     Marland Kitchen aspirin EC 81 MG tablet Take 81 mg by mouth daily.     . cholecalciferol (VITAMIN D) 1000 UNITS tablet Take 2,000 Units by mouth daily.     . Ferrous Gluconate 324 (37.5 Fe) MG TABS Take 324 mg by mouth 2 (two) times daily.     . Insulin Detemir (LEVEMIR FLEXTOUCH) 100 UNIT/ML Pen Inject 20 Units into the skin daily after supper. 21 mL 3  . Multiple Vitamin (MULTIVITAMIN) tablet Take 1 tablet by mouth daily at 12 noon.    . pantoprazole (PROTONIX) 40 MG tablet TAKE 1 TABLET(S) BY MOUTH DAILY 90 tablet 1  . pregabalin (LYRICA) 100 MG capsule Take 1 capsule (100 mg total) by mouth 3 (three) times daily. (Patient taking differently: Take 100 mg by mouth 2 (two) times daily. ) 90 capsule 3  . rosuvastatin (CRESTOR) 20 MG tablet TAKE 1 TABLET BY MOUTH EVERYDAY AT BEDTIME 90 tablet 3  . traMADol-acetaminophen (ULTRACET) 37.5-325 MG tablet Take 1 tablet by mouth 2 (two) times daily as needed for moderate pain. 60 tablet 2   . verapamil (CALAN) 40 MG tablet TAKE 1 TABLET(S) BY MOUTH TWICE A DAY FOR HYPERTENSION (Patient taking differently: Take 80 mg by mouth 2 (two) times daily. ) 60 tablet 4  . tamoxifen (NOLVADEX) 20 MG tablet Take 1 tablet (20 mg total) by mouth daily. 90 tablet 3   No current facility-administered medications for this visit.     OBJECTIVE: Vitals:   03/29/19 0854 03/29/19 0855  BP: (!) 155/70   Pulse: 83   Resp: 19   Temp:  97.6 F (36.4 C)  SpO2:  98%     Body mass index is 39.82 kg/m.    ECOG FS:0 - Asymptomatic  General: Well-developed, well-nourished, no acute distress. Eyes: Pink conjunctiva, anicteric sclera. HEENT: Normocephalic, moist mucous membranes. Breast: Left breast with surgical dressing.  Abscess noted with surgical packing. Lungs: Clear  to auscultation bilaterally. Heart: Regular rate and rhythm. No rubs, murmurs, or gallops. Abdomen: Soft, nontender, nondistended. No organomegaly noted, normoactive bowel sounds. Musculoskeletal: No edema, cyanosis, or clubbing. Neuro: Alert, answering all questions appropriately. Cranial nerves grossly intact. Skin: No rashes or petechiae noted. Psych: Normal affect.  LAB RESULTS:  Lab Results  Component Value Date   NA 142 03/24/2019   K 3.9 03/24/2019   CL 112 (H) 03/24/2019   CO2 23 03/24/2019   GLUCOSE 124 (H) 03/24/2019   BUN 35 (H) 03/24/2019   CREATININE 2.12 (H) 03/25/2019   CALCIUM 8.5 (L) 03/24/2019   PROT 7.7 03/21/2019   ALBUMIN 3.8 03/21/2019   AST 24 03/21/2019   ALT 21 03/21/2019   ALKPHOS 81 03/21/2019   BILITOT 1.3 (H) 03/21/2019   GFRNONAA 23 (L) 03/25/2019   GFRAA 26 (L) 03/25/2019    Lab Results  Component Value Date   WBC 11.2 (H) 03/24/2019   NEUTROABS 14.2 (H) 03/21/2019   HGB 9.1 (L) 03/24/2019   HCT 27.7 (L) 03/24/2019   MCV 87.9 03/24/2019   PLT 134 (L) 03/24/2019     STUDIES: Dg Chest Port 1 View  Result Date: 03/21/2019 CLINICAL DATA:  Fever EXAM: PORTABLE CHEST 1 VIEW  COMPARISON:  None. FINDINGS: No edema or consolidation. Heart is borderline enlarged with pulmonary vascularity normal. No adenopathy. There is aortic atherosclerosis. There is degenerative change in the left shoulder. IMPRESSION: No edema or consolidation. Borderline cardiac enlargement. No adenopathy. Aortic Atherosclerosis (ICD10-I70.0). Electronically Signed   By: Lowella Grip III M.D.   On: 03/21/2019 15:00    ASSESSMENT: [name]-grade DCIS of the right breast.  PLAN:    1. [name]-grade DCIS of the right breast: Patient declined enrollment in COMET trial.  She underwent lumpectomy on January 22, 2019.  Final pathology noted close, but clear margins.  Patient initiated XRT, but has discontinued after approximately 3 weeks. She states she is extremely upset how she was treated by radiation oncology and refuses to return or continue XRT.  We discussed the possibility of transferring care to another facility which she also declined.  She acknowledges that although she has [name]-grade DCIS, this increases her risk slightly of recurrence.  No further interventions are needed.  Patient was given a prescription for tamoxifen today which she will take for a total of 5 years completing in December 2025.  Return to clinic in 3 months for routine evaluation. 2.  Breast abscess: Patient has an appointment with surgery later this week.  Continue treatment and follow-up per surgery.   I spent a total of 30 minutes face-to-face with the patient of which greater than 50% of the visit was spent in counseling and coordination of care as detailed above.   Patient expressed understanding and was in agreement with this plan. She also understands that She can call clinic at any time with any questions, concerns, or complaints.   Cancer Staging Ductal carcinoma in situ (DCIS) of right breast Staging form: Breast, AJCC 8th Edition - Clinical stage from 01/01/2019: Stage 0 (cTis (DCIS), cN0, cM0, G1, ER+, PR: Not Assessed,  HER2: Not Assessed) - Signed by Lloyd Huger, MD on 01/01/2019   Lloyd Huger, MD   03/29/2019 2:07 PM

## 2019-03-29 ENCOUNTER — Ambulatory Visit: Payer: Medicare HMO

## 2019-03-29 ENCOUNTER — Other Ambulatory Visit: Payer: Self-pay

## 2019-03-29 ENCOUNTER — Telehealth: Payer: Self-pay | Admitting: Internal Medicine

## 2019-03-29 ENCOUNTER — Inpatient Hospital Stay: Payer: Medicare HMO | Attending: Oncology | Admitting: Oncology

## 2019-03-29 VITALS — BP 155/70 | HR 83 | Temp 97.6°F | Resp 19 | Wt 224.8 lb

## 2019-03-29 DIAGNOSIS — Z800 Family history of malignant neoplasm of digestive organs: Secondary | ICD-10-CM | POA: Insufficient documentation

## 2019-03-29 DIAGNOSIS — Z87442 Personal history of urinary calculi: Secondary | ICD-10-CM | POA: Diagnosis not present

## 2019-03-29 DIAGNOSIS — Z79899 Other long term (current) drug therapy: Secondary | ICD-10-CM | POA: Diagnosis not present

## 2019-03-29 DIAGNOSIS — D0511 Intraductal carcinoma in situ of right breast: Secondary | ICD-10-CM | POA: Diagnosis not present

## 2019-03-29 DIAGNOSIS — Z794 Long term (current) use of insulin: Secondary | ICD-10-CM | POA: Insufficient documentation

## 2019-03-29 DIAGNOSIS — E11319 Type 2 diabetes mellitus with unspecified diabetic retinopathy without macular edema: Secondary | ICD-10-CM | POA: Insufficient documentation

## 2019-03-29 DIAGNOSIS — E785 Hyperlipidemia, unspecified: Secondary | ICD-10-CM | POA: Insufficient documentation

## 2019-03-29 DIAGNOSIS — Z87891 Personal history of nicotine dependence: Secondary | ICD-10-CM | POA: Insufficient documentation

## 2019-03-29 DIAGNOSIS — N184 Chronic kidney disease, stage 4 (severe): Secondary | ICD-10-CM | POA: Insufficient documentation

## 2019-03-29 DIAGNOSIS — I700 Atherosclerosis of aorta: Secondary | ICD-10-CM | POA: Diagnosis not present

## 2019-03-29 DIAGNOSIS — E1122 Type 2 diabetes mellitus with diabetic chronic kidney disease: Secondary | ICD-10-CM | POA: Diagnosis not present

## 2019-03-29 DIAGNOSIS — I129 Hypertensive chronic kidney disease with stage 1 through stage 4 chronic kidney disease, or unspecified chronic kidney disease: Secondary | ICD-10-CM | POA: Insufficient documentation

## 2019-03-29 DIAGNOSIS — Z9079 Acquired absence of other genital organ(s): Secondary | ICD-10-CM | POA: Diagnosis not present

## 2019-03-29 DIAGNOSIS — Z803 Family history of malignant neoplasm of breast: Secondary | ICD-10-CM | POA: Insufficient documentation

## 2019-03-29 DIAGNOSIS — Z801 Family history of malignant neoplasm of trachea, bronchus and lung: Secondary | ICD-10-CM | POA: Insufficient documentation

## 2019-03-29 LAB — AEROBIC/ANAEROBIC CULTURE W GRAM STAIN (SURGICAL/DEEP WOUND): Gram Stain: NONE SEEN

## 2019-03-29 MED ORDER — TAMOXIFEN CITRATE 20 MG PO TABS: 20.0000 mg | ORAL_TABLET | Freq: Every day | ORAL | 3 refills | Status: DC

## 2019-03-29 NOTE — Telephone Encounter (Signed)
Pt states she has been on abx for a breast infection and has been on it since 12/3 when she was discharged

## 2019-03-30 ENCOUNTER — Ambulatory Visit (INDEPENDENT_AMBULATORY_CARE_PROVIDER_SITE_OTHER): Payer: Medicare HMO | Admitting: Internal Medicine

## 2019-03-30 ENCOUNTER — Ambulatory Visit: Payer: Medicare HMO

## 2019-03-30 ENCOUNTER — Telehealth: Payer: Self-pay

## 2019-03-30 DIAGNOSIS — N611 Abscess of the breast and nipple: Secondary | ICD-10-CM | POA: Diagnosis not present

## 2019-03-30 DIAGNOSIS — E1122 Type 2 diabetes mellitus with diabetic chronic kidney disease: Secondary | ICD-10-CM

## 2019-03-30 DIAGNOSIS — N184 Chronic kidney disease, stage 4 (severe): Secondary | ICD-10-CM | POA: Diagnosis not present

## 2019-03-30 DIAGNOSIS — Z794 Long term (current) use of insulin: Secondary | ICD-10-CM

## 2019-03-30 DIAGNOSIS — D0511 Intraductal carcinoma in situ of right breast: Secondary | ICD-10-CM

## 2019-03-30 MED ORDER — CIPROFLOXACIN HCL 500 MG PO TABS: 500.0000 mg | ORAL_TABLET | Freq: Two times a day (BID) | ORAL | 0 refills | Status: DC

## 2019-03-30 NOTE — Telephone Encounter (Signed)
Gave verbal order to [facility] [phone]  For wound care  and Physical therapy and occupational therapy  And nursing

## 2019-03-30 NOTE — Progress Notes (Signed)
[facility] [place], [place] [address]  Internal MEDICINE  Telephone Visit  Patient Name: Kara [name]  [id number]  [id number]  Date of Service: 04/04/2019  I connected with the patient at 920 by telephone and verified the patients identity using two identifiers.   I discussed the limitations, risks, security and privacy concerns of performing an evaluation and management service by telephone and the availability of in person appointments. I also discussed with the patient that there may be a patient responsible charge related to the service.  The patient expressed understanding and agrees to proceed.    Chief Complaint  Patient presents with  . Telephone Assessment  . Telephone Screen  . Hospitalization Follow-up    sepsis   . Diabetes  . Hypertension  . Hyperlipidemia   HPI Pt was admitted in the hospital with right breast infection / abscess. She was started on Vancomycin and Cefepime, later on discharged on Augmentin. She feels better. Pt has dx of right breast ductal carcinoma in situ ( August 2020)  S/P Lumpectomy 2020, getting radiation On Tamoxifen now. Pt also has CKD associated to DM  Current Medication: Outpatient Encounter Medications as of 03/30/2019  Medication Sig  . amoxicillin-clavulanate (AUGMENTIN) 875-125 MG tablet Take 1 tablet by mouth every 12 (twelve) hours.  . APPLE CIDER VINEGAR PO Take 2 tablets by mouth daily.   Marland Kitchen aspirin EC 81 MG tablet Take 81 mg by mouth daily.   . cholecalciferol (VITAMIN D) 1000 UNITS tablet Take 2,000 Units by mouth daily.   . Ferrous Gluconate 324 (37.5 Fe) MG TABS Take 324 mg by mouth 2 (two) times daily.   . Insulin Detemir (LEVEMIR FLEXTOUCH) 100 UNIT/ML Pen Inject 20 Units into the skin daily after supper.  . Multiple Vitamin (MULTIVITAMIN) tablet Take 1 tablet by mouth daily at 12 noon.  . pantoprazole (PROTONIX) 40 MG tablet TAKE 1 TABLET(S) BY MOUTH DAILY  . pregabalin (LYRICA) 100 MG capsule  Take 1 capsule (100 mg total) by mouth 3 (three) times daily. (Patient taking differently: Take 100 mg by mouth 2 (two) times daily. )  . rosuvastatin (CRESTOR) 20 MG tablet TAKE 1 TABLET BY MOUTH EVERYDAY AT BEDTIME  . tamoxifen (NOLVADEX) 20 MG tablet Take 1 tablet (20 mg total) by mouth daily.  . traMADol-acetaminophen (ULTRACET) 37.5-325 MG tablet Take 1 tablet by mouth 2 (two) times daily as needed for moderate pain.  . verapamil (CALAN) 40 MG tablet TAKE 1 TABLET(S) BY MOUTH TWICE A DAY FOR HYPERTENSION (Patient taking differently: Take 80 mg by mouth 2 (two) times daily. )  . ciprofloxacin (CIPRO) 500 MG tablet Take 1 tablet (500 mg total) by mouth 2 (two) times daily.   No facility-administered encounter medications on file as of 03/30/2019.    Surgical History: Past Surgical History:  Procedure Laterality Date  . BILATERAL SALPINGECTOMY    . BREAST BIOPSY Right 2014   neg- core  . BREAST BIOPSY Right 12/21/2018   [place] bx, venus clip,  DUCTAL CARCINOMA IN SITU  . BREAST BIOPSY Right 12/21/2018   [place] bx, ribbon clip,  FIBROEPITHELIAL PROLIFERATION WITH SCLEROSIS  . BREAST BIOPSY Right 01/01/2019   Affirm bx "X" clip-path pending  . BREAST DUCTAL SYSTEM EXCISION    . BREAST EXCISIONAL BIOPSY Right 2014   neg  . BREAST LUMPECTOMY Right 01/22/2019   path pending  . CATARACT EXTRACTION    . CHOLECYSTECTOMY    . CYSTOSCOPY WITH STENT PLACEMENT Left 04/14/2015  Procedure: CYSTOSCOPY WITH STENT PLACEMENT;  Surgeon: Hollice Espy, MD;  Location: [facility];  Service: Urology;  Laterality: Left;  . EXTRACORPOREAL SHOCK WAVE LITHOTRIPSY Left 09/29/2014   Procedure: EXTRACORPOREAL SHOCK WAVE LITHOTRIPSY (ESWL);  Surgeon: Hollice Espy, MD;  Location: [facility];  Service: Urology;  Laterality: Left;  . EYE SURGERY     bilateral cataract  . IRRIGATION AND DEBRIDEMENT ABSCESS Right 03/24/2019   Procedure: IRRIGATION AND DEBRIDEMENT ABSCESS RIGHT BREAST;  Surgeon: Herbert Pun, MD;   Location: [facility];  Service: General;  Laterality: Right;  . LAPAROSCOPIC OOPHERECTOMY Left   . PARTIAL MASTECTOMY WITH NEEDLE LOCALIZATION Right 01/22/2019   Procedure: PARTIAL MASTECTOMY WITH NEEDLE LOCALIZATION;  Surgeon: Herbert Pun, MD;  Location: [facility];  Service: General;  Laterality: Right;  . TONSILLECTOMY    . TUBAL LIGATION    . URETEROSCOPY WITH HOLMIUM LASER LITHOTRIPSY Left 04/14/2015   Procedure: URETEROSCOPY WITH HOLMIUM LASER LITHOTRIPSY;  Surgeon: Hollice Espy, MD;  Location: [facility];  Service: Urology;  Laterality: Left;    Medical History: Past Medical History:  Diagnosis Date  . Anemia   . Arthropathy   . Benign neoplasm of breast   . Chest pain   . Chronic kidney disease    30% kidney function  . Diabetes ([place])   . Diabetic retinopathy ([place])   . Dyspnea   . Edema   . History of kidney stones   . Hyperlipemia   . Hypersomnia with sleep apnea   . Hypertension   . 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 Lumbago   . Osteoarthrosis   . Ovarian failure   . SOB (shortness of breath)     Family History: Family History  Problem Relation Age of Onset  . Breast cancer Maternal Grandmother 73  . Colon cancer Mother   . Lung cancer Father     Social History   Socioeconomic History  . Marital status: Married    Spouse name: Not on file  . Number of children: Not on file  . Years of education: Not on file  . Highest education level: Not on file  Occupational History  . Not on file  Tobacco Use  . Smoking status: Former Smoker    Packs/day: 1.00    Years: 30.00    Pack years: 30.00    Quit date: 04/11/1995    Years since quitting: 23.9  . Smokeless tobacco: Never Used  Substance and Sexual Activity  . Alcohol use: No    Alcohol/week: 0.0 standard drinks  . Drug use: No  . Sexual activity: Not on file  Other Topics Concern  . Not on file  Social History Narrative  . Not on file   Social Determinants of Health   Financial  Resource Strain:   . Difficulty of Paying Living Expenses: Not on file  Food Insecurity:   . Worried About Charity fundraiser in the Last Year: Not on file  . Ran Out of Food in the Last Year: Not on file  Transportation Needs:   . Lack of Transportation (Medical): Not on file  . Lack of Transportation (Non-Medical): Not on file  Physical Activity:   . Days of Exercise per Week: Not on file  . Minutes of Exercise per Session: Not on file  Stress:   . Feeling of Stress : Not on file  Social Connections:   . Frequency of Communication with Friends and Family: Not on file  . Frequency of Social Gatherings with Friends  and Family: Not on file  . Attends Religious Services: Not on file  . Active Member of Clubs or Organizations: Not on file  . Attends Archivist Meetings: Not on file  . Marital Status: Not on file  Intimate Partner Violence:   . Fear of Current or Ex-Partner: Not on file  . Emotionally Abused: Not on file  . Physically Abused: Not on file  . Sexually Abused: Not on file   Review of Systems  Constitutional: Positive for fatigue. Negative for chills and fever.  Respiratory: Negative for cough and shortness of breath.   Cardiovascular: Negative for chest pain and palpitations.  Endocrine: Negative for polydipsia.  Genitourinary: Negative for dysuria and flank pain.  Psychiatric/Behavioral: Behavioral problem: depression. The patient is nervous/anxious.    Vital Signs: BP 135/77 Comment: yesterday  Ht 5\' 3"  (1.6 m)   Wt 221 lb (100.2 kg)   BMI 39.15 kg/m  Observation/Objective: Pt looks somewhat tired, no acute distress  Assessment/Plan: 1. Breast abscess of female - I reviewed C/S of right breast abscess and encouraged her to change abx coverage untl seen by her ID. She seems a bit reluctant. Sent rx for cipro if she changes her mind  - ciprofloxacin (CIPRO) 500 MG tablet; Take 1 tablet (500 mg total) by mouth 2 (two) times daily.  Dispense: 20  tablet; Refill: 0  2. Ductal carcinoma in situ (DCIS) of right breast - Pt has refused for additional radiation therapy. Oncology is following her and will give new recommendations    3. Type 2 diabetes mellitus with stage 4 chronic kidney disease, with long-term current use of insulin (HCC) - Continue on current dose of insulin, pt also sees nephrology   General Counseling: [name] understanding of the findings of today's phone visit and agrees with plan of treatment. I have discussed any further diagnostic evaluation that may be needed or ordered today. We also reviewed her medications today. she has been encouraged to call the office with any questions or concerns that should arise related to todays visit.   Meds ordered this encounter  Medications  . ciprofloxacin (CIPRO) 500 MG tablet    Sig: Take 1 tablet (500 mg total) by mouth 2 (two) times daily.    Dispense:  20 tablet    Refill:  0    Time spent:25 Minutes Dr Lavera Guise Internal medicine

## 2019-03-31 ENCOUNTER — Ambulatory Visit: Payer: Medicare HMO

## 2019-03-31 DIAGNOSIS — E1140 Type 2 diabetes mellitus with diabetic neuropathy, unspecified: Secondary | ICD-10-CM | POA: Diagnosis not present

## 2019-03-31 DIAGNOSIS — N184 Chronic kidney disease, stage 4 (severe): Secondary | ICD-10-CM | POA: Diagnosis not present

## 2019-03-31 DIAGNOSIS — I10 Essential (primary) hypertension: Secondary | ICD-10-CM | POA: Diagnosis not present

## 2019-03-31 DIAGNOSIS — C50911 Malignant neoplasm of unspecified site of right female breast: Secondary | ICD-10-CM | POA: Diagnosis not present

## 2019-03-31 DIAGNOSIS — E1122 Type 2 diabetes mellitus with diabetic chronic kidney disease: Secondary | ICD-10-CM | POA: Diagnosis not present

## 2019-03-31 DIAGNOSIS — D509 Iron deficiency anemia, unspecified: Secondary | ICD-10-CM | POA: Diagnosis not present

## 2019-03-31 DIAGNOSIS — N611 Abscess of the breast and nipple: Secondary | ICD-10-CM | POA: Diagnosis not present

## 2019-03-31 DIAGNOSIS — D631 Anemia in chronic kidney disease: Secondary | ICD-10-CM | POA: Diagnosis not present

## 2019-03-31 DIAGNOSIS — N179 Acute kidney failure, unspecified: Secondary | ICD-10-CM | POA: Diagnosis not present

## 2019-04-01 ENCOUNTER — Ambulatory Visit: Payer: Medicare HMO

## 2019-04-01 DIAGNOSIS — N184 Chronic kidney disease, stage 4 (severe): Secondary | ICD-10-CM | POA: Diagnosis not present

## 2019-04-01 DIAGNOSIS — I10 Essential (primary) hypertension: Secondary | ICD-10-CM | POA: Diagnosis not present

## 2019-04-01 DIAGNOSIS — C50911 Malignant neoplasm of unspecified site of right female breast: Secondary | ICD-10-CM | POA: Diagnosis not present

## 2019-04-01 DIAGNOSIS — N179 Acute kidney failure, unspecified: Secondary | ICD-10-CM | POA: Diagnosis not present

## 2019-04-01 DIAGNOSIS — D509 Iron deficiency anemia, unspecified: Secondary | ICD-10-CM | POA: Diagnosis not present

## 2019-04-01 DIAGNOSIS — E1140 Type 2 diabetes mellitus with diabetic neuropathy, unspecified: Secondary | ICD-10-CM | POA: Diagnosis not present

## 2019-04-01 DIAGNOSIS — N611 Abscess of the breast and nipple: Secondary | ICD-10-CM | POA: Diagnosis not present

## 2019-04-01 DIAGNOSIS — E1122 Type 2 diabetes mellitus with diabetic chronic kidney disease: Secondary | ICD-10-CM | POA: Diagnosis not present

## 2019-04-01 DIAGNOSIS — D631 Anemia in chronic kidney disease: Secondary | ICD-10-CM | POA: Diagnosis not present

## 2019-04-02 ENCOUNTER — Ambulatory Visit: Payer: Medicare HMO

## 2019-04-03 DIAGNOSIS — N179 Acute kidney failure, unspecified: Secondary | ICD-10-CM | POA: Diagnosis not present

## 2019-04-03 DIAGNOSIS — E1140 Type 2 diabetes mellitus with diabetic neuropathy, unspecified: Secondary | ICD-10-CM | POA: Diagnosis not present

## 2019-04-03 DIAGNOSIS — C50911 Malignant neoplasm of unspecified site of right female breast: Secondary | ICD-10-CM | POA: Diagnosis not present

## 2019-04-03 DIAGNOSIS — N611 Abscess of the breast and nipple: Secondary | ICD-10-CM | POA: Diagnosis not present

## 2019-04-03 DIAGNOSIS — D631 Anemia in chronic kidney disease: Secondary | ICD-10-CM | POA: Diagnosis not present

## 2019-04-03 DIAGNOSIS — I10 Essential (primary) hypertension: Secondary | ICD-10-CM | POA: Diagnosis not present

## 2019-04-03 DIAGNOSIS — D509 Iron deficiency anemia, unspecified: Secondary | ICD-10-CM | POA: Diagnosis not present

## 2019-04-03 DIAGNOSIS — E1122 Type 2 diabetes mellitus with diabetic chronic kidney disease: Secondary | ICD-10-CM | POA: Diagnosis not present

## 2019-04-03 DIAGNOSIS — N184 Chronic kidney disease, stage 4 (severe): Secondary | ICD-10-CM | POA: Diagnosis not present

## 2019-04-05 ENCOUNTER — Ambulatory Visit: Payer: Medicare HMO

## 2019-04-05 ENCOUNTER — Inpatient Hospital Stay: Payer: Medicare HMO

## 2019-04-05 ENCOUNTER — Other Ambulatory Visit: Payer: Self-pay

## 2019-04-05 DIAGNOSIS — D631 Anemia in chronic kidney disease: Secondary | ICD-10-CM | POA: Diagnosis not present

## 2019-04-05 DIAGNOSIS — D509 Iron deficiency anemia, unspecified: Secondary | ICD-10-CM | POA: Diagnosis not present

## 2019-04-05 DIAGNOSIS — I10 Essential (primary) hypertension: Secondary | ICD-10-CM | POA: Diagnosis not present

## 2019-04-05 DIAGNOSIS — E1122 Type 2 diabetes mellitus with diabetic chronic kidney disease: Secondary | ICD-10-CM | POA: Diagnosis not present

## 2019-04-05 DIAGNOSIS — N184 Chronic kidney disease, stage 4 (severe): Secondary | ICD-10-CM | POA: Diagnosis not present

## 2019-04-05 DIAGNOSIS — C50911 Malignant neoplasm of unspecified site of right female breast: Secondary | ICD-10-CM | POA: Diagnosis not present

## 2019-04-05 DIAGNOSIS — N179 Acute kidney failure, unspecified: Secondary | ICD-10-CM | POA: Diagnosis not present

## 2019-04-05 DIAGNOSIS — E1140 Type 2 diabetes mellitus with diabetic neuropathy, unspecified: Secondary | ICD-10-CM | POA: Diagnosis not present

## 2019-04-05 DIAGNOSIS — N611 Abscess of the breast and nipple: Secondary | ICD-10-CM | POA: Diagnosis not present

## 2019-04-06 ENCOUNTER — Ambulatory Visit: Payer: Medicare HMO

## 2019-04-06 DIAGNOSIS — H34832 Tributary (branch) retinal vein occlusion, left eye, with macular edema: Secondary | ICD-10-CM | POA: Diagnosis not present

## 2019-04-07 ENCOUNTER — Ambulatory Visit: Payer: Medicare HMO

## 2019-04-07 ENCOUNTER — Other Ambulatory Visit: Payer: Self-pay

## 2019-04-07 DIAGNOSIS — D631 Anemia in chronic kidney disease: Secondary | ICD-10-CM | POA: Diagnosis not present

## 2019-04-07 DIAGNOSIS — E1122 Type 2 diabetes mellitus with diabetic chronic kidney disease: Secondary | ICD-10-CM | POA: Diagnosis not present

## 2019-04-07 DIAGNOSIS — N184 Chronic kidney disease, stage 4 (severe): Secondary | ICD-10-CM | POA: Diagnosis not present

## 2019-04-07 DIAGNOSIS — I10 Essential (primary) hypertension: Secondary | ICD-10-CM | POA: Diagnosis not present

## 2019-04-07 DIAGNOSIS — C50911 Malignant neoplasm of unspecified site of right female breast: Secondary | ICD-10-CM | POA: Diagnosis not present

## 2019-04-07 DIAGNOSIS — N611 Abscess of the breast and nipple: Secondary | ICD-10-CM | POA: Diagnosis not present

## 2019-04-07 DIAGNOSIS — E1140 Type 2 diabetes mellitus with diabetic neuropathy, unspecified: Secondary | ICD-10-CM | POA: Diagnosis not present

## 2019-04-07 DIAGNOSIS — D509 Iron deficiency anemia, unspecified: Secondary | ICD-10-CM | POA: Diagnosis not present

## 2019-04-07 DIAGNOSIS — N179 Acute kidney failure, unspecified: Secondary | ICD-10-CM | POA: Diagnosis not present

## 2019-04-07 MED ORDER — LEVEMIR FLEXTOUCH 100 UNIT/ML ~~LOC~~ SOPN: 20.0000 [IU] | PEN_INJECTOR | Freq: Every day | SUBCUTANEOUS | 3 refills | Status: DC

## 2019-04-08 ENCOUNTER — Ambulatory Visit: Payer: Medicare HMO

## 2019-04-08 DIAGNOSIS — N184 Chronic kidney disease, stage 4 (severe): Secondary | ICD-10-CM | POA: Diagnosis not present

## 2019-04-08 DIAGNOSIS — E1140 Type 2 diabetes mellitus with diabetic neuropathy, unspecified: Secondary | ICD-10-CM | POA: Diagnosis not present

## 2019-04-08 DIAGNOSIS — D631 Anemia in chronic kidney disease: Secondary | ICD-10-CM | POA: Diagnosis not present

## 2019-04-08 DIAGNOSIS — I10 Essential (primary) hypertension: Secondary | ICD-10-CM | POA: Diagnosis not present

## 2019-04-08 DIAGNOSIS — N611 Abscess of the breast and nipple: Secondary | ICD-10-CM | POA: Diagnosis not present

## 2019-04-08 DIAGNOSIS — D509 Iron deficiency anemia, unspecified: Secondary | ICD-10-CM | POA: Diagnosis not present

## 2019-04-08 DIAGNOSIS — N179 Acute kidney failure, unspecified: Secondary | ICD-10-CM | POA: Diagnosis not present

## 2019-04-08 DIAGNOSIS — C50911 Malignant neoplasm of unspecified site of right female breast: Secondary | ICD-10-CM | POA: Diagnosis not present

## 2019-04-08 DIAGNOSIS — E1122 Type 2 diabetes mellitus with diabetic chronic kidney disease: Secondary | ICD-10-CM | POA: Diagnosis not present

## 2019-04-09 ENCOUNTER — Ambulatory Visit: Payer: Medicare HMO

## 2019-04-09 DIAGNOSIS — E1140 Type 2 diabetes mellitus with diabetic neuropathy, unspecified: Secondary | ICD-10-CM | POA: Diagnosis not present

## 2019-04-09 DIAGNOSIS — N179 Acute kidney failure, unspecified: Secondary | ICD-10-CM | POA: Diagnosis not present

## 2019-04-09 DIAGNOSIS — N611 Abscess of the breast and nipple: Secondary | ICD-10-CM | POA: Diagnosis not present

## 2019-04-09 DIAGNOSIS — D509 Iron deficiency anemia, unspecified: Secondary | ICD-10-CM | POA: Diagnosis not present

## 2019-04-09 DIAGNOSIS — N184 Chronic kidney disease, stage 4 (severe): Secondary | ICD-10-CM | POA: Diagnosis not present

## 2019-04-09 DIAGNOSIS — C50911 Malignant neoplasm of unspecified site of right female breast: Secondary | ICD-10-CM | POA: Diagnosis not present

## 2019-04-09 DIAGNOSIS — I10 Essential (primary) hypertension: Secondary | ICD-10-CM | POA: Diagnosis not present

## 2019-04-09 DIAGNOSIS — D631 Anemia in chronic kidney disease: Secondary | ICD-10-CM | POA: Diagnosis not present

## 2019-04-09 DIAGNOSIS — E1122 Type 2 diabetes mellitus with diabetic chronic kidney disease: Secondary | ICD-10-CM | POA: Diagnosis not present

## 2019-04-12 ENCOUNTER — Ambulatory Visit: Payer: Medicare HMO | Admitting: Oncology

## 2019-04-12 ENCOUNTER — Ambulatory Visit: Payer: Medicare HMO

## 2019-04-12 DIAGNOSIS — E1140 Type 2 diabetes mellitus with diabetic neuropathy, unspecified: Secondary | ICD-10-CM | POA: Diagnosis not present

## 2019-04-12 DIAGNOSIS — I10 Essential (primary) hypertension: Secondary | ICD-10-CM | POA: Diagnosis not present

## 2019-04-12 DIAGNOSIS — E1122 Type 2 diabetes mellitus with diabetic chronic kidney disease: Secondary | ICD-10-CM | POA: Diagnosis not present

## 2019-04-12 DIAGNOSIS — C50911 Malignant neoplasm of unspecified site of right female breast: Secondary | ICD-10-CM | POA: Diagnosis not present

## 2019-04-12 DIAGNOSIS — N611 Abscess of the breast and nipple: Secondary | ICD-10-CM | POA: Diagnosis not present

## 2019-04-12 DIAGNOSIS — D509 Iron deficiency anemia, unspecified: Secondary | ICD-10-CM | POA: Diagnosis not present

## 2019-04-12 DIAGNOSIS — D631 Anemia in chronic kidney disease: Secondary | ICD-10-CM | POA: Diagnosis not present

## 2019-04-12 DIAGNOSIS — N179 Acute kidney failure, unspecified: Secondary | ICD-10-CM | POA: Diagnosis not present

## 2019-04-12 DIAGNOSIS — N184 Chronic kidney disease, stage 4 (severe): Secondary | ICD-10-CM | POA: Diagnosis not present

## 2019-04-13 ENCOUNTER — Ambulatory Visit: Payer: Medicare HMO

## 2019-04-13 DIAGNOSIS — I10 Essential (primary) hypertension: Secondary | ICD-10-CM | POA: Diagnosis not present

## 2019-04-13 DIAGNOSIS — N179 Acute kidney failure, unspecified: Secondary | ICD-10-CM | POA: Diagnosis not present

## 2019-04-13 DIAGNOSIS — D631 Anemia in chronic kidney disease: Secondary | ICD-10-CM | POA: Diagnosis not present

## 2019-04-13 DIAGNOSIS — C50911 Malignant neoplasm of unspecified site of right female breast: Secondary | ICD-10-CM | POA: Diagnosis not present

## 2019-04-13 DIAGNOSIS — N611 Abscess of the breast and nipple: Secondary | ICD-10-CM | POA: Diagnosis not present

## 2019-04-13 DIAGNOSIS — N184 Chronic kidney disease, stage 4 (severe): Secondary | ICD-10-CM | POA: Diagnosis not present

## 2019-04-13 DIAGNOSIS — E1140 Type 2 diabetes mellitus with diabetic neuropathy, unspecified: Secondary | ICD-10-CM | POA: Diagnosis not present

## 2019-04-13 DIAGNOSIS — E1122 Type 2 diabetes mellitus with diabetic chronic kidney disease: Secondary | ICD-10-CM | POA: Diagnosis not present

## 2019-04-13 DIAGNOSIS — D509 Iron deficiency anemia, unspecified: Secondary | ICD-10-CM | POA: Diagnosis not present

## 2019-04-14 ENCOUNTER — Ambulatory Visit: Payer: Medicare HMO

## 2019-04-15 ENCOUNTER — Ambulatory Visit: Payer: Medicare HMO

## 2019-04-19 ENCOUNTER — Ambulatory Visit: Payer: Medicare HMO

## 2019-04-19 ENCOUNTER — Other Ambulatory Visit: Payer: Self-pay

## 2019-04-19 DIAGNOSIS — N179 Acute kidney failure, unspecified: Secondary | ICD-10-CM | POA: Diagnosis not present

## 2019-04-19 DIAGNOSIS — C50911 Malignant neoplasm of unspecified site of right female breast: Secondary | ICD-10-CM | POA: Diagnosis not present

## 2019-04-19 DIAGNOSIS — I10 Essential (primary) hypertension: Secondary | ICD-10-CM | POA: Diagnosis not present

## 2019-04-19 DIAGNOSIS — D509 Iron deficiency anemia, unspecified: Secondary | ICD-10-CM | POA: Diagnosis not present

## 2019-04-19 DIAGNOSIS — E1122 Type 2 diabetes mellitus with diabetic chronic kidney disease: Secondary | ICD-10-CM | POA: Diagnosis not present

## 2019-04-19 DIAGNOSIS — D631 Anemia in chronic kidney disease: Secondary | ICD-10-CM | POA: Diagnosis not present

## 2019-04-19 DIAGNOSIS — N611 Abscess of the breast and nipple: Secondary | ICD-10-CM | POA: Diagnosis not present

## 2019-04-19 DIAGNOSIS — N184 Chronic kidney disease, stage 4 (severe): Secondary | ICD-10-CM | POA: Diagnosis not present

## 2019-04-19 DIAGNOSIS — E1140 Type 2 diabetes mellitus with diabetic neuropathy, unspecified: Secondary | ICD-10-CM | POA: Diagnosis not present

## 2019-04-19 MED ORDER — VERAPAMIL HCL 40 MG PO TABS: ORAL_TABLET | ORAL | 4 refills | Status: DC

## 2019-04-20 ENCOUNTER — Ambulatory Visit: Payer: Medicare HMO

## 2019-04-20 DIAGNOSIS — C50911 Malignant neoplasm of unspecified site of right female breast: Secondary | ICD-10-CM | POA: Diagnosis not present

## 2019-04-20 DIAGNOSIS — D631 Anemia in chronic kidney disease: Secondary | ICD-10-CM | POA: Diagnosis not present

## 2019-04-20 DIAGNOSIS — N179 Acute kidney failure, unspecified: Secondary | ICD-10-CM | POA: Diagnosis not present

## 2019-04-20 DIAGNOSIS — D509 Iron deficiency anemia, unspecified: Secondary | ICD-10-CM | POA: Diagnosis not present

## 2019-04-20 DIAGNOSIS — E1122 Type 2 diabetes mellitus with diabetic chronic kidney disease: Secondary | ICD-10-CM | POA: Diagnosis not present

## 2019-04-20 DIAGNOSIS — N611 Abscess of the breast and nipple: Secondary | ICD-10-CM | POA: Diagnosis not present

## 2019-04-20 DIAGNOSIS — E1140 Type 2 diabetes mellitus with diabetic neuropathy, unspecified: Secondary | ICD-10-CM | POA: Diagnosis not present

## 2019-04-20 DIAGNOSIS — N184 Chronic kidney disease, stage 4 (severe): Secondary | ICD-10-CM | POA: Diagnosis not present

## 2019-04-20 DIAGNOSIS — I10 Essential (primary) hypertension: Secondary | ICD-10-CM | POA: Diagnosis not present

## 2019-04-21 ENCOUNTER — Ambulatory Visit: Payer: Medicare HMO

## 2019-04-22 ENCOUNTER — Ambulatory Visit: Payer: Medicare HMO

## 2019-04-22 DIAGNOSIS — C50911 Malignant neoplasm of unspecified site of right female breast: Secondary | ICD-10-CM | POA: Diagnosis not present

## 2019-04-22 DIAGNOSIS — N179 Acute kidney failure, unspecified: Secondary | ICD-10-CM | POA: Diagnosis not present

## 2019-04-22 DIAGNOSIS — N611 Abscess of the breast and nipple: Secondary | ICD-10-CM | POA: Diagnosis not present

## 2019-04-22 DIAGNOSIS — D509 Iron deficiency anemia, unspecified: Secondary | ICD-10-CM | POA: Diagnosis not present

## 2019-04-22 DIAGNOSIS — N184 Chronic kidney disease, stage 4 (severe): Secondary | ICD-10-CM | POA: Diagnosis not present

## 2019-04-22 DIAGNOSIS — D631 Anemia in chronic kidney disease: Secondary | ICD-10-CM | POA: Diagnosis not present

## 2019-04-22 DIAGNOSIS — E1140 Type 2 diabetes mellitus with diabetic neuropathy, unspecified: Secondary | ICD-10-CM | POA: Diagnosis not present

## 2019-04-22 DIAGNOSIS — E1122 Type 2 diabetes mellitus with diabetic chronic kidney disease: Secondary | ICD-10-CM | POA: Diagnosis not present

## 2019-04-22 DIAGNOSIS — I10 Essential (primary) hypertension: Secondary | ICD-10-CM | POA: Diagnosis not present

## 2019-04-26 ENCOUNTER — Other Ambulatory Visit: Payer: Self-pay

## 2019-04-26 DIAGNOSIS — N184 Chronic kidney disease, stage 4 (severe): Secondary | ICD-10-CM | POA: Diagnosis not present

## 2019-04-26 DIAGNOSIS — D509 Iron deficiency anemia, unspecified: Secondary | ICD-10-CM | POA: Diagnosis not present

## 2019-04-26 DIAGNOSIS — E1122 Type 2 diabetes mellitus with diabetic chronic kidney disease: Secondary | ICD-10-CM | POA: Diagnosis not present

## 2019-04-26 DIAGNOSIS — E1140 Type 2 diabetes mellitus with diabetic neuropathy, unspecified: Secondary | ICD-10-CM | POA: Diagnosis not present

## 2019-04-26 DIAGNOSIS — C50911 Malignant neoplasm of unspecified site of right female breast: Secondary | ICD-10-CM | POA: Diagnosis not present

## 2019-04-26 DIAGNOSIS — D631 Anemia in chronic kidney disease: Secondary | ICD-10-CM | POA: Diagnosis not present

## 2019-04-26 DIAGNOSIS — N611 Abscess of the breast and nipple: Secondary | ICD-10-CM | POA: Diagnosis not present

## 2019-04-26 DIAGNOSIS — N179 Acute kidney failure, unspecified: Secondary | ICD-10-CM | POA: Diagnosis not present

## 2019-04-26 DIAGNOSIS — M064 Inflammatory polyarthropathy: Secondary | ICD-10-CM

## 2019-04-26 DIAGNOSIS — I10 Essential (primary) hypertension: Secondary | ICD-10-CM | POA: Diagnosis not present

## 2019-04-26 MED ORDER — TRAMADOL-ACETAMINOPHEN 37.5-325 MG PO TABS: 1.0000 | ORAL_TABLET | Freq: Two times a day (BID) | ORAL | 2 refills | Status: DC | PRN

## 2019-04-27 DIAGNOSIS — E1122 Type 2 diabetes mellitus with diabetic chronic kidney disease: Secondary | ICD-10-CM | POA: Diagnosis not present

## 2019-04-27 DIAGNOSIS — D631 Anemia in chronic kidney disease: Secondary | ICD-10-CM | POA: Diagnosis not present

## 2019-04-27 DIAGNOSIS — N184 Chronic kidney disease, stage 4 (severe): Secondary | ICD-10-CM | POA: Diagnosis not present

## 2019-04-27 DIAGNOSIS — I10 Essential (primary) hypertension: Secondary | ICD-10-CM | POA: Diagnosis not present

## 2019-04-27 DIAGNOSIS — C50911 Malignant neoplasm of unspecified site of right female breast: Secondary | ICD-10-CM | POA: Diagnosis not present

## 2019-04-27 DIAGNOSIS — D509 Iron deficiency anemia, unspecified: Secondary | ICD-10-CM | POA: Diagnosis not present

## 2019-04-27 DIAGNOSIS — E1140 Type 2 diabetes mellitus with diabetic neuropathy, unspecified: Secondary | ICD-10-CM | POA: Diagnosis not present

## 2019-04-27 DIAGNOSIS — N611 Abscess of the breast and nipple: Secondary | ICD-10-CM | POA: Diagnosis not present

## 2019-04-27 DIAGNOSIS — N179 Acute kidney failure, unspecified: Secondary | ICD-10-CM | POA: Diagnosis not present

## 2019-04-29 ENCOUNTER — Other Ambulatory Visit: Payer: Self-pay

## 2019-04-29 MED ORDER — TRULICITY 1.5 MG/0.5ML ~~LOC~~ SOAJ: SUBCUTANEOUS | 1 refills | Status: DC

## 2019-04-30 DIAGNOSIS — C50911 Malignant neoplasm of unspecified site of right female breast: Secondary | ICD-10-CM | POA: Diagnosis not present

## 2019-04-30 DIAGNOSIS — E1140 Type 2 diabetes mellitus with diabetic neuropathy, unspecified: Secondary | ICD-10-CM | POA: Diagnosis not present

## 2019-04-30 DIAGNOSIS — E1122 Type 2 diabetes mellitus with diabetic chronic kidney disease: Secondary | ICD-10-CM | POA: Diagnosis not present

## 2019-04-30 DIAGNOSIS — N184 Chronic kidney disease, stage 4 (severe): Secondary | ICD-10-CM | POA: Diagnosis not present

## 2019-04-30 DIAGNOSIS — N611 Abscess of the breast and nipple: Secondary | ICD-10-CM | POA: Diagnosis not present

## 2019-04-30 DIAGNOSIS — D631 Anemia in chronic kidney disease: Secondary | ICD-10-CM | POA: Diagnosis not present

## 2019-04-30 DIAGNOSIS — I10 Essential (primary) hypertension: Secondary | ICD-10-CM | POA: Diagnosis not present

## 2019-04-30 DIAGNOSIS — N179 Acute kidney failure, unspecified: Secondary | ICD-10-CM | POA: Diagnosis not present

## 2019-04-30 DIAGNOSIS — D509 Iron deficiency anemia, unspecified: Secondary | ICD-10-CM | POA: Diagnosis not present

## 2019-05-03 ENCOUNTER — Telehealth: Payer: Self-pay

## 2019-05-03 NOTE — Telephone Encounter (Signed)
HOME HEALTH PLAN OF CARE SIGNED AND MAILED TO [facility] ON 05-03-19.

## 2019-05-04 DIAGNOSIS — N179 Acute kidney failure, unspecified: Secondary | ICD-10-CM | POA: Diagnosis not present

## 2019-05-04 DIAGNOSIS — N611 Abscess of the breast and nipple: Secondary | ICD-10-CM | POA: Diagnosis not present

## 2019-05-04 DIAGNOSIS — E1140 Type 2 diabetes mellitus with diabetic neuropathy, unspecified: Secondary | ICD-10-CM | POA: Diagnosis not present

## 2019-05-04 DIAGNOSIS — I10 Essential (primary) hypertension: Secondary | ICD-10-CM | POA: Diagnosis not present

## 2019-05-04 DIAGNOSIS — D509 Iron deficiency anemia, unspecified: Secondary | ICD-10-CM | POA: Diagnosis not present

## 2019-05-04 DIAGNOSIS — C50911 Malignant neoplasm of unspecified site of right female breast: Secondary | ICD-10-CM | POA: Diagnosis not present

## 2019-05-04 DIAGNOSIS — E1122 Type 2 diabetes mellitus with diabetic chronic kidney disease: Secondary | ICD-10-CM | POA: Diagnosis not present

## 2019-05-04 DIAGNOSIS — N184 Chronic kidney disease, stage 4 (severe): Secondary | ICD-10-CM | POA: Diagnosis not present

## 2019-05-04 DIAGNOSIS — D631 Anemia in chronic kidney disease: Secondary | ICD-10-CM | POA: Diagnosis not present

## 2019-05-07 ENCOUNTER — Telehealth: Payer: Self-pay

## 2019-05-07 DIAGNOSIS — N179 Acute kidney failure, unspecified: Secondary | ICD-10-CM | POA: Diagnosis not present

## 2019-05-07 DIAGNOSIS — E1140 Type 2 diabetes mellitus with diabetic neuropathy, unspecified: Secondary | ICD-10-CM | POA: Diagnosis not present

## 2019-05-07 DIAGNOSIS — D631 Anemia in chronic kidney disease: Secondary | ICD-10-CM | POA: Diagnosis not present

## 2019-05-07 DIAGNOSIS — C50911 Malignant neoplasm of unspecified site of right female breast: Secondary | ICD-10-CM | POA: Diagnosis not present

## 2019-05-07 DIAGNOSIS — E1122 Type 2 diabetes mellitus with diabetic chronic kidney disease: Secondary | ICD-10-CM | POA: Diagnosis not present

## 2019-05-07 DIAGNOSIS — N611 Abscess of the breast and nipple: Secondary | ICD-10-CM | POA: Diagnosis not present

## 2019-05-07 DIAGNOSIS — N184 Chronic kidney disease, stage 4 (severe): Secondary | ICD-10-CM | POA: Diagnosis not present

## 2019-05-07 DIAGNOSIS — D509 Iron deficiency anemia, unspecified: Secondary | ICD-10-CM | POA: Diagnosis not present

## 2019-05-07 DIAGNOSIS — I10 Essential (primary) hypertension: Secondary | ICD-10-CM | POA: Diagnosis not present

## 2019-05-07 NOTE — Telephone Encounter (Signed)
Absolutely. Will check HgbA1c and discuss further at visit 05/18/2019. If needed, she can increase levemir to 55 units daily.

## 2019-05-07 NOTE — Telephone Encounter (Signed)
When did she increase her levemir? Amaryl was stopped due to abnormal renal unctions. I don't want th restart this.

## 2019-05-07 NOTE — Telephone Encounter (Signed)
Pt increased to 30 units daily on 04/21/19 and to 35 units on 04/25/2019 then finally to 50 units on 05/05/2019. Notified pt that we dont want her to start on amaryl because of abormal renal functions. She wanted to make sure if you put her on another med it does not interfere with tamoxifen

## 2019-05-10 NOTE — Telephone Encounter (Signed)
PT WAS NOTIFIED. 

## 2019-05-11 DIAGNOSIS — H34832 Tributary (branch) retinal vein occlusion, left eye, with macular edema: Secondary | ICD-10-CM | POA: Diagnosis not present

## 2019-05-12 DIAGNOSIS — N611 Abscess of the breast and nipple: Secondary | ICD-10-CM | POA: Diagnosis not present

## 2019-05-12 DIAGNOSIS — D509 Iron deficiency anemia, unspecified: Secondary | ICD-10-CM | POA: Diagnosis not present

## 2019-05-12 DIAGNOSIS — E1122 Type 2 diabetes mellitus with diabetic chronic kidney disease: Secondary | ICD-10-CM | POA: Diagnosis not present

## 2019-05-12 DIAGNOSIS — N179 Acute kidney failure, unspecified: Secondary | ICD-10-CM | POA: Diagnosis not present

## 2019-05-12 DIAGNOSIS — C50911 Malignant neoplasm of unspecified site of right female breast: Secondary | ICD-10-CM | POA: Diagnosis not present

## 2019-05-12 DIAGNOSIS — I10 Essential (primary) hypertension: Secondary | ICD-10-CM | POA: Diagnosis not present

## 2019-05-12 DIAGNOSIS — D631 Anemia in chronic kidney disease: Secondary | ICD-10-CM | POA: Diagnosis not present

## 2019-05-12 DIAGNOSIS — E1140 Type 2 diabetes mellitus with diabetic neuropathy, unspecified: Secondary | ICD-10-CM | POA: Diagnosis not present

## 2019-05-12 DIAGNOSIS — N184 Chronic kidney disease, stage 4 (severe): Secondary | ICD-10-CM | POA: Diagnosis not present

## 2019-05-13 ENCOUNTER — Ambulatory Visit: Payer: Medicare HMO | Admitting: Nurse Practitioner

## 2019-05-13 ENCOUNTER — Telehealth: Payer: Self-pay

## 2019-05-13 NOTE — Telephone Encounter (Signed)
CONFIRMED AND SCREENED 05-18-19 OV

## 2019-05-14 DIAGNOSIS — N184 Chronic kidney disease, stage 4 (severe): Secondary | ICD-10-CM | POA: Diagnosis not present

## 2019-05-14 DIAGNOSIS — I10 Essential (primary) hypertension: Secondary | ICD-10-CM | POA: Diagnosis not present

## 2019-05-14 DIAGNOSIS — N611 Abscess of the breast and nipple: Secondary | ICD-10-CM | POA: Diagnosis not present

## 2019-05-14 DIAGNOSIS — E1140 Type 2 diabetes mellitus with diabetic neuropathy, unspecified: Secondary | ICD-10-CM | POA: Diagnosis not present

## 2019-05-14 DIAGNOSIS — C50911 Malignant neoplasm of unspecified site of right female breast: Secondary | ICD-10-CM | POA: Diagnosis not present

## 2019-05-14 DIAGNOSIS — N179 Acute kidney failure, unspecified: Secondary | ICD-10-CM | POA: Diagnosis not present

## 2019-05-14 DIAGNOSIS — D631 Anemia in chronic kidney disease: Secondary | ICD-10-CM | POA: Diagnosis not present

## 2019-05-14 DIAGNOSIS — E1122 Type 2 diabetes mellitus with diabetic chronic kidney disease: Secondary | ICD-10-CM | POA: Diagnosis not present

## 2019-05-14 DIAGNOSIS — D509 Iron deficiency anemia, unspecified: Secondary | ICD-10-CM | POA: Diagnosis not present

## 2019-05-18 ENCOUNTER — Other Ambulatory Visit: Payer: Self-pay

## 2019-05-18 ENCOUNTER — Encounter: Payer: Self-pay | Admitting: Nurse Practitioner

## 2019-05-18 ENCOUNTER — Ambulatory Visit (INDEPENDENT_AMBULATORY_CARE_PROVIDER_SITE_OTHER): Payer: Medicare HMO | Admitting: Nurse Practitioner

## 2019-05-18 VITALS — BP 156/62 | HR 66 | Temp 96.5°F | Resp 16 | Ht 63.0 in | Wt 202.0 lb

## 2019-05-18 DIAGNOSIS — N184 Chronic kidney disease, stage 4 (severe): Secondary | ICD-10-CM | POA: Diagnosis not present

## 2019-05-18 DIAGNOSIS — Z794 Long term (current) use of insulin: Secondary | ICD-10-CM | POA: Diagnosis not present

## 2019-05-18 DIAGNOSIS — I10 Essential (primary) hypertension: Secondary | ICD-10-CM | POA: Diagnosis not present

## 2019-05-18 DIAGNOSIS — E1165 Type 2 diabetes mellitus with hyperglycemia: Secondary | ICD-10-CM

## 2019-05-18 DIAGNOSIS — D0511 Intraductal carcinoma in situ of right breast: Secondary | ICD-10-CM

## 2019-05-18 DIAGNOSIS — N61 Mastitis without abscess: Secondary | ICD-10-CM | POA: Diagnosis not present

## 2019-05-18 LAB — POCT GLYCOSYLATED HEMOGLOBIN (HGB A1C): Hemoglobin A1C: 9.1 % — AB (ref 4.0–5.6)

## 2019-05-18 NOTE — Progress Notes (Signed)
[facility] [place], [place] [address]  Internal MEDICINE  Office Visit Note  Patient Name: Kara [name]  [id number]  [id number]  Date of Service: 05/18/2019  Chief Complaint  Patient presents with  . Diabetes  . Gastroesophageal Reflux    The patient is here for follow up visit. Blood sugars have been elevated. Currently taking levemir at 55 units. Blood sugars started to get high after hospitalization in November, 2020 due to sepsis from breast abscess. Glimepiride was discontinued due to elevated renal functions and levemir was decreased to 25 units because she was hypoglycemic during her hospital stay. She continues to take trulicity 1.5mg  weekly. Her blood pressure is moderately elevated today. Came down after a few minutes.  She continues to have some right breast tenderness. Abscess does drain a little, intermittently.       Current Medication: Outpatient Encounter Medications as of 05/18/2019  Medication Sig  . APPLE CIDER VINEGAR PO Take 2 tablets by mouth daily.   Marland Kitchen aspirin EC 81 MG tablet Take 81 mg by mouth daily.   . cholecalciferol (VITAMIN D) 1000 UNITS tablet Take 2,000 Units by mouth daily.   . Dulaglutide (TRULICITY) 1.5 YT/0.3TW SOPN INJECT 1.5 MG SUBCUTANEOUSLY ONCE WEEKLY.  . Ferrous Gluconate 324 (37.5 Fe) MG TABS Take 324 mg by mouth 2 (two) times daily.   . Insulin Detemir (LEVEMIR FLEXTOUCH) 100 UNIT/ML Pen Inject 20 Units into the skin daily after supper.  . Multiple Vitamin (MULTIVITAMIN) tablet Take 1 tablet by mouth daily at 12 noon.  . pantoprazole (PROTONIX) 40 MG tablet TAKE 1 TABLET(S) BY MOUTH DAILY  . pregabalin (LYRICA) 100 MG capsule Take 1 capsule (100 mg total) by mouth 3 (three) times daily. (Patient taking differently: Take 100 mg by mouth 2 (two) times daily. )  . rosuvastatin (CRESTOR) 20 MG tablet TAKE 1 TABLET BY MOUTH EVERYDAY AT BEDTIME  . tamoxifen (NOLVADEX) 20 MG tablet Take 1 tablet (20 mg total) by mouth  daily.  . traMADol-acetaminophen (ULTRACET) 37.5-325 MG tablet Take 1 tablet by mouth 2 (two) times daily as needed for moderate pain.  . verapamil (CALAN) 40 MG tablet TAKE 1 TABLET(S) BY MOUTH TWICE A DAY FOR HYPERTENSION  . [DISCONTINUED] amoxicillin-clavulanate (AUGMENTIN) 875-125 MG tablet Take 1 tablet by mouth every 12 (twelve) hours. (Patient not taking: Reported on 05/18/2019)  . [DISCONTINUED] ciprofloxacin (CIPRO) 500 MG tablet Take 1 tablet (500 mg total) by mouth 2 (two) times daily. (Patient not taking: Reported on 05/18/2019)   No facility-administered encounter medications on file as of 05/18/2019.    Surgical History: Past Surgical History:  Procedure Laterality Date  . BILATERAL SALPINGECTOMY    . BREAST BIOPSY Right 2014   neg- core  . BREAST BIOPSY Right 12/21/2018   [place] bx, venus clip,  DUCTAL CARCINOMA IN SITU  . BREAST BIOPSY Right 12/21/2018   [place] bx, ribbon clip,  FIBROEPITHELIAL PROLIFERATION WITH SCLEROSIS  . BREAST BIOPSY Right 01/01/2019   Affirm bx "X" clip-path pending  . BREAST DUCTAL SYSTEM EXCISION    . BREAST EXCISIONAL BIOPSY Right 2014   neg  . BREAST LUMPECTOMY Right 01/22/2019   path pending  . CATARACT EXTRACTION    . CHOLECYSTECTOMY    . CYSTOSCOPY WITH STENT PLACEMENT Left 04/14/2015   Procedure: CYSTOSCOPY WITH STENT PLACEMENT;  Surgeon: Hollice Espy, MD;  Location: [facility];  Service: Urology;  Laterality: Left;  . EXTRACORPOREAL SHOCK WAVE LITHOTRIPSY Left 09/29/2014   Procedure: EXTRACORPOREAL SHOCK WAVE  LITHOTRIPSY (ESWL);  Surgeon: Hollice Espy, MD;  Location: [facility];  Service: Urology;  Laterality: Left;  . EYE SURGERY     bilateral cataract  . IRRIGATION AND DEBRIDEMENT ABSCESS Right 03/24/2019   Procedure: IRRIGATION AND DEBRIDEMENT ABSCESS RIGHT BREAST;  Surgeon: Herbert Pun, MD;  Location: [facility] OR[facility];  Service: General;  Laterality: Right;  . LAPAROSCOPIC OOPHERECTOMY Left   . PARTIAL MASTECTOMY WITH NEEDLE LOCALIZATION  Right 01/22/2019   Procedure: PARTIAL MASTECTOMY WITH NEEDLE LOCALIZATION;  Surgeon: Herbert Pun, MD;  Location: [facility];  Service: General;  Laterality: Right;  . TONSILLECTOMY    . TUBAL LIGATION    . URETEROSCOPY WITH HOLMIUM LASER LITHOTRIPSY Left 04/14/2015   Procedure: URETEROSCOPY WITH HOLMIUM LASER LITHOTRIPSY;  Surgeon: Hollice Espy, MD;  Location: [facility];  Service: Urology;  Laterality: Left;    Medical History: Past Medical History:  Diagnosis Date  . Anemia   . Arthropathy   . Benign neoplasm of breast   . Chest pain   . Chronic kidney disease    30% kidney function  . Diabetes ([place])   . Diabetic retinopathy ([place])   . Dyspnea   . Edema   . History of kidney stones   . Hyperlipemia   . Hypersomnia with sleep apnea   . Hypertension   . 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 Lumbago   . Osteoarthrosis   . Ovarian failure   . SOB (shortness of breath)     Family History: Family History  Problem Relation Age of Onset  . Breast cancer Maternal Grandmother 43  . Colon cancer Mother   . Lung cancer Father     Social History   Socioeconomic History  . Marital status: Married    Spouse name: Not on file  . Number of children: Not on file  . Years of education: Not on file  . Highest education level: Not on file  Occupational History  . Not on file  Tobacco Use  . Smoking status: Former Smoker    Packs/day: 1.00    Years: 30.00    Pack years: 30.00    Types: Cigarettes    Quit date: 04/11/1995    Years since quitting: 24.1  . Smokeless tobacco: Never Used  Substance and Sexual Activity  . Alcohol use: No    Alcohol/week: 0.0 standard drinks  . Drug use: No  . Sexual activity: Not on file  Other Topics Concern  . Not on file  Social History Narrative  . Not on file   Social Determinants of Health   Financial Resource Strain:   . Difficulty of Paying Living Expenses: Not on file  Food Insecurity:   . Worried About Ship broker in the Last Year: Not on file  . Ran Out of Food in the Last Year: Not on file  Transportation Needs:   . Lack of Transportation (Medical): Not on file  . Lack of Transportation (Non-Medical): Not on file  Physical Activity:   . Days of Exercise per Week: Not on file  . Minutes of Exercise per Session: Not on file  Stress:   . Feeling of Stress : Not on file  Social Connections:   . Frequency of Communication with Friends and Family: Not on file  . Frequency of Social Gatherings with Friends and Family: Not on file  . Attends Religious Services: Not on file  . Active Member of Clubs or Organizations: Not on file  . Attends Club or  Organization Meetings: Not on file  . Marital Status: Not on file  Intimate Partner Violence:   . Fear of Current or Ex-Partner: Not on file  . Emotionally Abused: Not on file  . Physically Abused: Not on file  . Sexually Abused: Not on file      Review of Systems  Constitutional: Positive for fatigue. Negative for chills and unexpected weight change.  HENT: Negative for congestion, postnasal drip, rhinorrhea, sneezing and sore throat.   Respiratory: Negative for cough, chest tightness, shortness of breath and wheezing.   Cardiovascular: Negative for chest pain and palpitations.       Mildly elevated blood pressure.   Gastrointestinal: Negative for abdominal pain, constipation, diarrhea, nausea and vomiting.  Endocrine: Negative for cold intolerance, heat intolerance, polydipsia and polyuria.       Blood sugars are gradually increasing. Mostly running in the mid to high 200s.   Genitourinary:       Having some right breast pain.   Musculoskeletal: Negative for arthralgias, back pain, joint swelling and neck pain.  Skin: Negative for rash.  Allergic/Immunologic: Negative for environmental allergies.  Neurological: Positive for headaches. Negative for dizziness, tremors and numbness.       Reports an occasional headache since she started  taking tamoxifen.   Hematological: Negative for adenopathy. Does not bruise/bleed easily.  Psychiatric/Behavioral: Negative for behavioral problems (Depression), sleep disturbance and suicidal ideas. The patient is not nervous/anxious.     Today's Vitals   05/18/19 1131 05/18/19 1148  BP: (!) 185/67 (!) 156/62  Pulse: 64 66  Resp: 16   Temp: (!) 96.5 F (35.8 C)   SpO2:  97%  Weight: 202 lb (91.6 kg)   Height: 5\' 3"  (1.6 m)    Body mass index is 35.78 kg/m.  Physical Exam Vitals and nursing note reviewed.  Constitutional:      General: She is not in acute distress.    Appearance: Normal appearance. She is well-developed. She is not diaphoretic.  HENT:     Head: Normocephalic and atraumatic.     Nose: Nose normal.     Mouth/Throat:     Pharynx: No oropharyngeal exudate.  Eyes:     Conjunctiva/sclera: Conjunctivae normal.     Pupils: Pupils are equal, round, and reactive to light.  Neck:     Thyroid: No thyromegaly.     Vascular: No carotid bruit or JVD.     Trachea: No tracheal deviation.  Cardiovascular:     Rate and Rhythm: Normal rate and regular rhythm.     Heart sounds: Normal heart sounds. No murmur. No friction rub. No gallop.   Pulmonary:     Effort: Pulmonary effort is normal. No respiratory distress.     Breath sounds: Normal breath sounds. No wheezing or rales.  Chest:     Chest wall: No tenderness.  Abdominal:     Palpations: Abdomen is soft.  Musculoskeletal:        General: Normal range of motion.     Cervical back: Normal range of motion and neck supple.  Lymphadenopathy:     Cervical: No cervical adenopathy.  Skin:    General: Skin is warm and dry.  Neurological:     Mental Status: She is alert and oriented to person, place, and time.     Cranial Nerves: No cranial nerve deficit.  Psychiatric:        Behavior: Behavior normal.        Thought Content: Thought content normal.  Judgment: Judgment normal.    Assessment/Plan: 1. Type 2  diabetes mellitus with hyperglycemia, with long-term current use of insulin (HCC) - POCT HgB A1C 9.1 today. Will increase Levemir to 60 units daily. continue trulicity 1.5mg  weekly. Continue to monitor blood sugars closely. Will increase levemir as indicated.   2. CKD (chronic kidney disease), stage IV ([place]) Reviewed most recent labs. Renal functions improving. She should continue to follow up with nephrology as scheduled.   3. Essential hypertension Generally stable. Continue bp medication as prescribed   4. Ductal carcinoma in situ (DCIS) of right breast Patient completed 18 treatments of radiation after tumor removed from right breast. Currently on tamoxifen. Oncology visits as scheduled.   5. Cellulitis of right breast Continues to heal. Sees surgeon as scheduled.   General Counseling: [name] understanding of the findings of todays visit and agrees with plan of treatment. I have discussed any further diagnostic evaluation that may be needed or ordered today. We also reviewed her medications today. she has been encouraged to call the office with any questions or concerns that should arise related to todays visit.    Orders Placed This Encounter  Procedures  . POCT HgB A1C   Diabetes Counseling:  1. Addition of ACE inh/ ARB'S for nephroprotection. Microalbumin is updated  2. Diabetic foot care, prevention of complications. Podiatry consult 3. Exercise and lose weight.  4. Diabetic eye examination, Diabetic eye exam is updated  5. Monitor blood sugar closlely. nutrition counseling.  6. Sign and symptoms of hypoglycemia including shaking sweating,confusion and headaches.  This patient was seen by Leretha Pol FNP Collaboration with Dr Lavera Guise as a part of collaborative care agreement   Total time spent: 35 Minutes    Time spent includes review of chart, medications, test results, and follow up plan with the patient.      Dr Lavera Guise Internal medicine

## 2019-06-01 ENCOUNTER — Telehealth: Payer: Self-pay

## 2019-06-01 NOTE — Telephone Encounter (Signed)
HOME HEALTH PLAN OF CARE SIGNED AND FAXED BACK TO [place].

## 2019-06-03 ENCOUNTER — Telehealth: Payer: Self-pay

## 2019-06-03 DIAGNOSIS — D0511 Intraductal carcinoma in situ of right breast: Secondary | ICD-10-CM

## 2019-06-03 NOTE — Telephone Encounter (Signed)
Survivorship Care Plan visit completed.  Treatment summary reviewed and mailed to patient.  ASCO answers booklet reviewed and mailed to patient.  CARE program and Cancer Transitions discussed with patient along with other resources cancer center offers to patients and caregivers.  Patient verbalized understanding.  Patient in agreement for APP to call and introduce Survivorship Clinic.   Packet mailed.

## 2019-06-08 ENCOUNTER — Telehealth: Payer: Self-pay

## 2019-06-08 NOTE — Telephone Encounter (Signed)
PLAN OF CARE SIGNED AND MAILED TO [place].

## 2019-06-10 ENCOUNTER — Other Ambulatory Visit: Payer: Self-pay

## 2019-06-10 MED ORDER — TRULICITY 1.5 MG/0.5ML ~~LOC~~ SOAJ: SUBCUTANEOUS | 1 refills | Status: DC

## 2019-06-12 ENCOUNTER — Other Ambulatory Visit: Payer: Self-pay | Admitting: Internal Medicine

## 2019-06-17 ENCOUNTER — Ambulatory Visit: Payer: Medicare HMO | Admitting: Oncology

## 2019-06-17 ENCOUNTER — Inpatient Hospital Stay: Payer: Medicare HMO | Attending: Oncology | Admitting: Oncology

## 2019-06-17 ENCOUNTER — Other Ambulatory Visit: Payer: Self-pay

## 2019-06-17 DIAGNOSIS — D0511 Intraductal carcinoma in situ of right breast: Secondary | ICD-10-CM | POA: Diagnosis not present

## 2019-06-17 NOTE — Progress Notes (Signed)
Survivorship Clinic Consult Note [facility]  [phone] Fax:([phone]  CLINIC:  Survivorship  REASON FOR VISIT:  Long-term survivorship surveillance visit for patient with history of DCIS of the right breast  I connected with Kara [name] on 06/17/19 at 11:00 AM EST by telephone visit and verified that I am speaking with the correct person using two identifiers.   I discussed the limitations, risks, security and privacy concerns of performing an evaluation and management service by telemedicine and the availability of in-person appointments. I also discussed with the patient that there may be a patient responsible charge related to this service. The patient expressed understanding and agreed to proceed.   Other persons participating in the visit and their role in the encounter: None   Patient's location: Home Provider's location: Office  BRIEF ONCOLOGIC HISTORY:  Oncology History  Ductal carcinoma in situ (DCIS) of right breast  01/01/2019 Initial Diagnosis   Ductal carcinoma in situ (DCIS) of right breast   01/01/2019 Cancer Staging   Staging form: Breast, AJCC 8th Edition - Clinical stage from 01/01/2019: Stage 0 (cTis (DCIS), cN0, cM0, G1, ER+, PR: Not Assessed, HER2: Not Assessed) - Signed by Lloyd Huger, MD on 01/01/2019     INTERVAL HISTORY:  Patient has history of DCIS of right breast status post lumpectomy in October 2020 and XRT discontinued early secondary to the development of a breast abscess.  She received approximately 3 weeks worth of XRT.  She declined further XRT treatment.  She is currently on tamoxifen.  She unfortunately developed right breast mastitis from radiation.  She was admitted to the hospital and treated for sepsis which was later ruled out with negative blood cultures.  She was discharged on Augmentin for 10 days.  She did require incision and drainage of abscess and packing at discharge.     ADDITIONAL REVIEW OF SYSTEMS:  Review of Systems  Constitutional: Negative.  Negative for chills, fever, malaise/fatigue and weight loss.  HENT: Negative for congestion, ear pain and tinnitus.   Eyes: Negative.  Negative for blurred vision and double vision.  Respiratory: Negative.  Negative for cough, sputum production and shortness of breath.   Cardiovascular: Negative.  Negative for chest pain, palpitations and leg swelling.  Gastrointestinal: Negative.  Negative for abdominal pain, constipation, diarrhea, nausea and vomiting.  Genitourinary: Negative for dysuria, frequency and urgency.  Musculoskeletal: Negative for back pain and falls.  Skin: Negative.  Negative for rash.  Neurological: Negative.  Negative for weakness and headaches.  Endo/Heme/Allergies: Negative.  Does not bruise/bleed easily.  Psychiatric/Behavioral: Negative.  Negative for depression. The patient is not nervous/anxious and does not have insomnia.      PAST MEDICAL & SURGICAL HISTORY:  Past Medical History:  Diagnosis Date  . Anemia   . Arthropathy   . Benign neoplasm of breast   . Chest pain   . Chronic kidney disease    30% kidney function  . Diabetes ([place])   . Diabetic retinopathy ([place])   . Dyspnea   . Edema   . History of kidney stones   . Hyperlipemia   . Hypersomnia with sleep apnea   . Hypertension   . 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 Lumbago   . Osteoarthrosis   . Ovarian failure   . SOB (shortness of breath)    Past Surgical History:  Procedure Laterality Date  . BILATERAL SALPINGECTOMY    . BREAST BIOPSY Right 2014   neg- core  .  BREAST BIOPSY Right 12/21/2018   [place] bx, venus clip,  DUCTAL CARCINOMA IN SITU  . BREAST BIOPSY Right 12/21/2018   [place] bx, ribbon clip,  FIBROEPITHELIAL PROLIFERATION WITH SCLEROSIS  . BREAST BIOPSY Right 01/01/2019   Affirm bx "X" clip-path pending  . BREAST DUCTAL SYSTEM EXCISION    . BREAST EXCISIONAL BIOPSY Right 2014   neg  . BREAST  LUMPECTOMY Right 01/22/2019   path pending  . CATARACT EXTRACTION    . CHOLECYSTECTOMY    . CYSTOSCOPY WITH STENT PLACEMENT Left 04/14/2015   Procedure: CYSTOSCOPY WITH STENT PLACEMENT;  Surgeon: Hollice Espy, MD;  Location: [facility];  Service: Urology;  Laterality: Left;  . EXTRACORPOREAL SHOCK WAVE LITHOTRIPSY Left 09/29/2014   Procedure: EXTRACORPOREAL SHOCK WAVE LITHOTRIPSY (ESWL);  Surgeon: Hollice Espy, MD;  Location: [facility] OR[facility];  Service: Urology;  Laterality: Left;  . EYE SURGERY     bilateral cataract  . IRRIGATION AND DEBRIDEMENT ABSCESS Right 03/24/2019   Procedure: IRRIGATION AND DEBRIDEMENT ABSCESS RIGHT BREAST;  Surgeon: Herbert Pun, MD;  Location: [facility];  Service: General;  Laterality: Right;  . LAPAROSCOPIC OOPHERECTOMY Left   . PARTIAL MASTECTOMY WITH NEEDLE LOCALIZATION Right 01/22/2019   Procedure: PARTIAL MASTECTOMY WITH NEEDLE LOCALIZATION;  Surgeon: Herbert Pun, MD;  Location: [facility];  Service: General;  Laterality: Right;  . TONSILLECTOMY    . TUBAL LIGATION    . URETEROSCOPY WITH HOLMIUM LASER LITHOTRIPSY Left 04/14/2015   Procedure: URETEROSCOPY WITH HOLMIUM LASER LITHOTRIPSY;  Surgeon: Hollice Espy, MD;  Location: [facility];  Service: Urology;  Laterality: Left;    SOCIAL HISTORY:  None   CURRENT MEDICATIONS:  Current Outpatient Medications on File Prior to Visit  Medication Sig Dispense Refill  . APPLE CIDER VINEGAR PO Take 2 tablets by mouth daily.     Marland Kitchen aspirin EC 81 MG tablet Take 81 mg by mouth daily.     . cholecalciferol (VITAMIN D) 1000 UNITS tablet Take 2,000 Units by mouth daily.     . Dulaglutide (TRULICITY) 1.5 HY/0.7PX SOPN INJECT 1.5 MG SUBCUTANEOUSLY ONCE WEEKLY. 2 pen 1  . Ferrous Gluconate 324 (37.5 Fe) MG TABS Take 324 mg by mouth 2 (two) times daily.     . Insulin Detemir (LEVEMIR FLEXTOUCH) 100 UNIT/ML Pen Inject 20 Units into the skin daily after supper. 21 mL 3  . Multiple Vitamin (MULTIVITAMIN) tablet Take 1  tablet by mouth daily at 12 noon.    . pantoprazole (PROTONIX) 40 MG tablet TAKE 1 TABLET(S) BY MOUTH DAILY 90 tablet 1  . pregabalin (LYRICA) 100 MG capsule Take 1 capsule (100 mg total) by mouth 3 (three) times daily. (Patient taking differently: Take 100 mg by mouth 2 (two) times daily. ) 90 capsule 3  . rosuvastatin (CRESTOR) 20 MG tablet TAKE 1 TABLET BY MOUTH EVERYDAY AT BEDTIME 90 tablet 3  . tamoxifen (NOLVADEX) 20 MG tablet Take 1 tablet (20 mg total) by mouth daily. 90 tablet 3  . traMADol-acetaminophen (ULTRACET) 37.5-325 MG tablet Take 1 tablet by mouth 2 (two) times daily as needed for moderate pain. 60 tablet 2  . verapamil (CALAN) 40 MG tablet TAKE 1 TABLET(S) BY MOUTH TWICE A DAY FOR HYPERTENSION 60 tablet 4   No current facility-administered medications on file prior to visit.    ALLERGIES:  No Known Allergies  PHYSICAL EXAM:  There were no vitals filed for this visit. There were no vitals filed for this visit.  Limited due to virtual platform  LABORATORY DATA:  CBC  Component Value Date/Time   WBC 11.2 (H) 03/24/2019 0511   RBC 3.15 (L) 03/24/2019 0511   HGB 9.1 (L) 03/24/2019 0511   HGB 11.6 (L) 03/08/2013 1137   HCT 27.7 (L) 03/24/2019 0511   HCT 34.8 (L) 03/08/2013 1137   PLT 134 (L) 03/24/2019 0511   PLT 145 (L) 03/08/2013 1137   MCV 87.9 03/24/2019 0511   MCV 87 03/08/2013 1137   MCH 28.9 03/24/2019 0511   MCHC 32.9 03/24/2019 0511   RDW 16.5 (H) 03/24/2019 0511   RDW 16.7 (H) 03/08/2013 1137   LYMPHSABS 0.4 (L) 03/21/2019 1450   LYMPHSABS 2.6 07/23/2011 1103   MONOABS 1.3 (H) 03/21/2019 1450   MONOABS 0.6 07/23/2011 1103   EOSABS 0.0 03/21/2019 1450   EOSABS 0.2 07/23/2011 1103   BASOSABS 0.0 03/21/2019 1450   BASOSABS 0.0 07/23/2011 1103     Chemistry      Component Value Date/Time   NA 142 03/24/2019 0511   NA 141 03/08/2013 1137   K 3.9 03/24/2019 0511   K 4.1 03/08/2013 1137   CL 112 (H) 03/24/2019 0511   CL 106 03/08/2013 1137    CO2 23 03/24/2019 0511   CO2 30 03/08/2013 1137   BUN 35 (H) 03/24/2019 0511   BUN 26 (H) 03/08/2013 1137   CREATININE 2.12 (H) 03/25/2019 0541   CREATININE 1.47 (H) 03/08/2013 1137      Component Value Date/Time   CALCIUM 8.5 (L) 03/24/2019 0511   CALCIUM 9.7 03/08/2013 1137   ALKPHOS 81 03/21/2019 1450   ALKPHOS 85 07/23/2011 1103   AST 24 03/21/2019 1450   AST 16 07/23/2011 1103   ALT 21 03/21/2019 1450   ALT 24 07/23/2011 1103   BILITOT 1.3 (H) 03/21/2019 1450   BILITOT 0.4 07/23/2011 1103      DIAGNOSTIC IMAGING:  Mammogram at diagnosis-August 2020  FINDINGS: Additional mammographic views confirm a lobulated mass in the posterior third of the outer right breast. The mass measures 9 mm and contains a central calcification.  Mammographic images were processed with CAD.  Targeted ultrasound is performed, showing an oval hypoechoic mass with partially circumscribed and partially indistinct margins in the 9:30 position right breast 9 cm from the nipple. A small central echogenic focus likely reflects a calcifications seen on mammography. There is mild posterior acoustic enhancement. No internal vascular flow identified. Ultrasound of the right axilla is negative for lymphadenopathy.  IMPRESSION: Indeterminate 9 mm mass in the 9:30 position of the right breast.  RECOMMENDATION: Ultrasound-guided core needle biopsy for pathologic diagnosis is recommended.  I have discussed the findings and recommendations with the patient. Results were also provided in writing at the conclusion of the visit. If applicable, a reminder letter will be sent to the patient regarding the next appointment.  BI-RADS CATEGORY  4: Suspicious.  ASSESSMENT & PLAN:  Kara [name] is a pleasant 74 y.o. female with a history of right DCIS treated with lumpectomy and radiation.  She is currently on tamoxifen.  She presents to survivorship clinic today for her care plan and to address any  acute concerns after completing treatment.  1. History of Right DCIS cancer: Clinically, she is without evidence of disease recurrence based on physical exam/diagnostic imaging.  Today, she received a copy of her survivorship care plan (SCP) document, which was reviewed with her in detail.  The SCP details her cancer treatment history and potential late/long-term side effects of those treatments.  We discussed the follow-up schedule she can anticipate  with interval imaging for surveillance of her cancer.  I have also shared a copy of her treatment summary/SCP with her PCP.  Kara [name] will return to the survivorship clinic as needed; she will return to [place] [facility] for surveillance visit with Dr. Grayland Ormond next month.  2. Problem at visit: None  3. Smoking cessation: I commended Kara [name]'s continued efforts to remain tobacco-free.  We discussed that one of the most important risk reduction strategies in preventing cancer recurrence in lung cancer patients is smoking cessation.   4. Physical activity/Healthy eating: Getting adequate physical activity and maintaining a healthy diet as a cancer survivor is important for overall wellness and reduces the risk of cancer recurrence. We discussed the CARE program which is a fitness program that is offered to cancer survivors free of charge.  We also reviewed the American Cancer Society's booklet with recommendations for nutrition and physical activity.    4. Health promotion/Cancer screening:  Kara [name] is reportedly up-to-date on her colonoscopy, pap smear, PSA tests, skin screenings, and vaccinations.  I encouraged her to talk with his PCP about arranging appropriate cancer screening tests, as appropriate.   5. Support services/Counseling: Kara [name] was seen today in in effort to address both the physical and social concerns of our cancer survivors at [facility]. It is not uncommon for this period of the  patient's cancer care trajectory to be one of many emotions and stressors.  I provided support today through active listening, validation of concerns, and expressive supportive counseling.  Kara [name] was encouraged to take advantage of our support services programs and support groups to better cope in her new life as a cancer survivor after completing anti-cancer treatment.   We reviewed NCCN guidelines for DCIS postsurgical treatment and surveillance.   Per guidelines her follow-up is appropriate and scheduled.   Dispo:  RTC as scheduled next month for MD assessment.  She has no longer following up with radiation oncology. Mammogram will be due in August 2021.-This will be scheduled at subsequent visits. Return to survivorship clinic as needed.  I provided 15 minutes of non face-to-face telephone visit time during this encounter, and > 50% was spent counseling as documented under my assessment & plan.   Rulon Abide, AGNP-C [place] [facility] [place] (office) 06/17/19 10:56 AM

## 2019-06-21 ENCOUNTER — Other Ambulatory Visit: Payer: Self-pay

## 2019-06-21 DIAGNOSIS — E1142 Type 2 diabetes mellitus with diabetic polyneuropathy: Secondary | ICD-10-CM

## 2019-06-22 DIAGNOSIS — H34832 Tributary (branch) retinal vein occlusion, left eye, with macular edema: Secondary | ICD-10-CM | POA: Diagnosis not present

## 2019-06-23 MED ORDER — PREGABALIN 100 MG PO CAPS: 100.0000 mg | ORAL_CAPSULE | Freq: Three times a day (TID) | ORAL | 3 refills | Status: DC

## 2019-06-25 NOTE — Addendum Note (Signed)
Addended by: Faythe Casa E on: 06/25/2019 12:46 PM   Modules accepted: Level of Service

## 2019-06-26 ENCOUNTER — Other Ambulatory Visit: Payer: Self-pay | Admitting: Internal Medicine

## 2019-07-08 DIAGNOSIS — I129 Hypertensive chronic kidney disease with stage 1 through stage 4 chronic kidney disease, or unspecified chronic kidney disease: Secondary | ICD-10-CM | POA: Diagnosis not present

## 2019-07-08 DIAGNOSIS — N184 Chronic kidney disease, stage 4 (severe): Secondary | ICD-10-CM | POA: Diagnosis not present

## 2019-07-08 DIAGNOSIS — E1122 Type 2 diabetes mellitus with diabetic chronic kidney disease: Secondary | ICD-10-CM | POA: Diagnosis not present

## 2019-07-08 DIAGNOSIS — R600 Localized edema: Secondary | ICD-10-CM | POA: Diagnosis not present

## 2019-07-08 DIAGNOSIS — N2581 Secondary hyperparathyroidism of renal origin: Secondary | ICD-10-CM | POA: Diagnosis not present

## 2019-07-08 DIAGNOSIS — D631 Anemia in chronic kidney disease: Secondary | ICD-10-CM | POA: Diagnosis not present

## 2019-07-09 NOTE — Progress Notes (Signed)
[place]  [phone] Fax:(336) [phone]  ID: [name] OB: 11-14-1945  MR#: [id number]  YBF#:[id number]  Patient Care Team: Lavera Guise, MD as PCP - General (Internal Medicine) Lloyd Huger, MD as Consulting Physician (Oncology) Herbert Pun, MD as Consulting Physician (General Surgery) Noreene Filbert, MD as Referring Physician (Radiation Oncology)  CHIEF COMPLAINT: Low-grade DCIS of the right breast.  INTERVAL HISTORY: Patient returns to clinic today for routine 37-monthevaluation.  She currently feels well and is nearly back to her baseline.  She has a mild increase in the arthritis of her hands that does not affect her day-to-day activity.  She is otherwise tolerating tamoxifen well without significant side effects.  She has no neurologic complaints.  She denies any recent fevers or illnesses.  She has a good appetite and denies weight loss.  She has no chest pain, shortness of breath, cough, or hemoptysis.  She denies any nausea, vomiting, constipation, or diarrhea.  She has no urinary complaints.  Patient offers no further specific complaints today.  REVIEW OF SYSTEMS:   Review of Systems  Constitutional: Negative.  Negative for fever, malaise/fatigue and weight loss.  Respiratory: Negative.  Negative for cough, hemoptysis and shortness of breath.   Cardiovascular: Negative.  Negative for chest pain and leg swelling.  Gastrointestinal: Negative.  Negative for abdominal pain.  Genitourinary: Negative.  Negative for dysuria.  Musculoskeletal: Positive for joint pain. Negative for back pain.  Skin: Negative.  Negative for rash.  Neurological: Negative.  Negative for dizziness, focal weakness, weakness and headaches.  Psychiatric/Behavioral: Negative.  The patient is not nervous/anxious.     As per HPI. Otherwise, a complete review of systems is negative.  PAST MEDICAL HISTORY: Past Medical History:  Diagnosis Date  .  Anemia   . Arthropathy   . Benign neoplasm of breast   . Chest pain   . Chronic kidney disease    30% kidney function  . Diabetes ([place]   . Diabetic retinopathy ([place]   . Dyspnea   . Edema   . History of kidney stones   . Hyperlipemia   . Hypersomnia with sleep apnea   . Hypertension   . 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[place]   . Lumbago   . Osteoarthrosis   . Ovarian failure   . SOB (shortness of breath)     PAST SURGICAL HISTORY: Past Surgical History:  Procedure Laterality Date  . BILATERAL SALPINGECTOMY    . BREAST BIOPSY Right 2014   neg- core  . BREAST BIOPSY Right 12/21/2018   [place], venus clip,  DUCTAL CARCINOMA IN SITU  . BREAST BIOPSY Right 12/21/2018   [place], ribbon clip,  FIBROEPITHELIAL PROLIFERATION WITH SCLEROSIS  . BREAST BIOPSY Right 01/01/2019   Affirm bx "X" clip-path pending  . BREAST DUCTAL SYSTEM EXCISION    . BREAST EXCISIONAL BIOPSY Right 2014   neg  . BREAST LUMPECTOMY Right 01/22/2019   path pending  . CATARACT EXTRACTION    . CHOLECYSTECTOMY    . CYSTOSCOPY WITH STENT PLACEMENT Left 04/14/2015   Procedure: CYSTOSCOPY WITH STENT PLACEMENT;  Surgeon: AHollice Espy MD;  Location: [facility];  Service: Urology;  Laterality: Left;  . EXTRACORPOREAL SHOCK WAVE LITHOTRIPSY Left 09/29/2014   Procedure: EXTRACORPOREAL SHOCK WAVE LITHOTRIPSY (ESWL);  Surgeon: AHollice Espy MD;  Location: [facility];  Service: Urology;  Laterality: Left;  . EYE SURGERY     bilateral cataract  . IRRIGATION AND DEBRIDEMENT ABSCESS Right 03/24/2019  Procedure: IRRIGATION AND DEBRIDEMENT ABSCESS RIGHT BREAST;  Surgeon: Herbert Pun, MD;  Location: [facility];  Service: General;  Laterality: Right;  . LAPAROSCOPIC OOPHERECTOMY Left   . PARTIAL MASTECTOMY WITH NEEDLE LOCALIZATION Right 01/22/2019   Procedure: PARTIAL MASTECTOMY WITH NEEDLE LOCALIZATION;  Surgeon: Herbert Pun, MD;  Location: [facility];  Service: General;  Laterality: Right;  . TONSILLECTOMY      . TUBAL LIGATION    . URETEROSCOPY WITH HOLMIUM LASER LITHOTRIPSY Left 04/14/2015   Procedure: URETEROSCOPY WITH HOLMIUM LASER LITHOTRIPSY;  Surgeon: Hollice Espy, MD;  Location: [facility];  Service: Urology;  Laterality: Left;    FAMILY HISTORY: Family History  Problem Relation Age of Onset  . Breast cancer Maternal Grandmother 77  . Colon cancer Mother   . Lung cancer Father     ADVANCED DIRECTIVES (Y/N):  N  HEALTH MAINTENANCE: Social History   Tobacco Use  . Smoking status: Former Smoker    Packs/day: 1.00    Years: 30.00    Pack years: 30.00    Types: Cigarettes    Quit date: 04/11/1995    Years since quitting: 24.2  . Smokeless tobacco: Never Used  Substance Use Topics  . Alcohol use: No    Alcohol/week: 0.0 standard drinks  . Drug use: No     Colonoscopy:  PAP:  Bone density:  Lipid panel:  No Known Allergies  Current Outpatient Medications  Medication Sig Dispense Refill  . APPLE CIDER VINEGAR PO Take 2 tablets by mouth daily.     Marland Kitchen aspirin EC 81 MG tablet Take 81 mg by mouth daily.     . cholecalciferol (VITAMIN D) 1000 UNITS tablet Take 2,000 Units by mouth daily.     . Dulaglutide (TRULICITY) 1.5 OI/7.1IW SOPN INJECT 1.5 MG SUBCUTANEOUSLY ONCE WEEKLY. 2 pen 1  . Ferrous Gluconate 324 (37.5 Fe) MG TABS Take 324 mg by mouth 2 (two) times daily.     . furosemide (LASIX) 20 MG tablet Take 20 mg by mouth daily.    . Insulin Detemir (LEVEMIR FLEXTOUCH) 100 UNIT/ML Pen Inject 20 Units into the skin daily after supper. (Patient taking differently: Inject 70 Units into the skin daily after supper. ) 21 mL 3  . Lactobacillus (PROBIOTIC ACIDOPHILUS) CAPS Take 1 capsule by mouth daily.    . Multiple Vitamin (MULTIVITAMIN) tablet Take 1 tablet by mouth daily at 12 noon.    . pantoprazole (PROTONIX) 40 MG tablet TAKE 1 TABLET(S) BY MOUTH DAILY 90 tablet 1  . pregabalin (LYRICA) 100 MG capsule Take 1 capsule (100 mg total) by mouth 3 (three) times daily. 90 capsule  3  . rosuvastatin (CRESTOR) 20 MG tablet TAKE 1 TABLET BY MOUTH EVERYDAY AT BEDTIME 90 tablet 3  . tamoxifen (NOLVADEX) 20 MG tablet Take 1 tablet (20 mg total) by mouth daily. 90 tablet 3  . traMADol-acetaminophen (ULTRACET) 37.5-325 MG tablet Take 1 tablet by mouth 2 (two) times daily as needed for moderate pain. 60 tablet 2  . verapamil (CALAN) 40 MG tablet TAKE 1 TABLET(S) BY MOUTH TWICE A DAY FOR HYPERTENSION 180 tablet 1   No current facility-administered medications for this visit.    OBJECTIVE: Vitals:   07/13/19 1024  BP: (!) 192/72  Pulse: 63  Resp: 18  Temp: (!) 97.5 F (36.4 C)  SpO2: 100%     Body mass index is 36.49 kg/m.    ECOG FS:0 - Asymptomatic  General: Well-developed, well-nourished, no acute distress. Eyes: Pink conjunctiva, anicteric sclera. HEENT:  Normocephalic, moist mucous membranes. Breast: Exam deferred today. Lungs: No audible wheezing or coughing. Heart: Regular rate and rhythm. Abdomen: Soft, nontender, no obvious distention. Musculoskeletal: No edema, cyanosis, or clubbing. Neuro: Alert, answering all questions appropriately. Cranial nerves grossly intact. Skin: No rashes or petechiae noted. Psych: Normal affect.   LAB RESULTS:  Lab Results  Component Value Date   NA 142 03/24/2019   K 3.9 03/24/2019   CL 112 (H) 03/24/2019   CO2 23 03/24/2019   GLUCOSE 124 (H) 03/24/2019   BUN 35 (H) 03/24/2019   CREATININE 2.12 (H) 03/25/2019   CALCIUM 8.5 (L) 03/24/2019   PROT 7.7 03/21/2019   ALBUMIN 3.8 03/21/2019   AST 24 03/21/2019   ALT 21 03/21/2019   ALKPHOS 81 03/21/2019   BILITOT 1.3 (H) 03/21/2019   GFRNONAA 23 (L) 03/25/2019   GFRAA 26 (L) 03/25/2019    Lab Results  Component Value Date   WBC 11.2 (H) 03/24/2019   NEUTROABS 14.2 (H) 03/21/2019   HGB 9.1 (L) 03/24/2019   HCT 27.7 (L) 03/24/2019   MCV 87.9 03/24/2019   PLT 134 (L) 03/24/2019     STUDIES: No results found.  ASSESSMENT: Low-grade DCIS of the right  breast.  PLAN:    1. Low-grade DCIS of the right breast: Patient declined enrollment in COMET trial.  She underwent lumpectomy on January 22, 2019.  Final pathology noted close, but clear margins.  Patient initiated XRT, but has discontinued after approximately 3 weeks. She was extremely upset how she was treated by radiation oncology and refused to return or continue XRT.  We discussed the possibility of transferring care to another facility which she also declined.  She acknowledges that although she has low-grade DCIS, this increases her risk slightly of recurrence.  No further interventions are needed.  Continue tamoxifen for total of 5 years completing treatment in December 2025.  Return to clinic in 6 months for routine evaluation.  2.  Breast abscess: Essentially resolved.  Patient has follow-up with surgery tomorrow. 3.  Joint pain: Patient states she has tramadol which she takes periodically.    Patient expressed understanding and was in agreement with this plan. She also understands that She can call clinic at any time with any questions, concerns, or complaints.   Cancer Staging Ductal carcinoma in situ (DCIS) of right breast Staging form: Breast, AJCC 8th Edition - Clinical stage from 01/01/2019: Stage 0 (cTis (DCIS), cN0, cM0, G1, ER+, PR: Not Assessed, HER2: Not Assessed) - Signed by Lloyd Huger, MD on 01/01/2019   Lloyd Huger, MD   07/13/2019 1:00 PM

## 2019-07-12 ENCOUNTER — Ambulatory Visit: Payer: Medicare HMO | Admitting: Oncology

## 2019-07-12 ENCOUNTER — Encounter: Payer: Self-pay | Admitting: Oncology

## 2019-07-12 NOTE — Progress Notes (Signed)
Patient states she has been having some pain in joints in hand and headaches.

## 2019-07-13 ENCOUNTER — Other Ambulatory Visit: Payer: Self-pay

## 2019-07-13 ENCOUNTER — Inpatient Hospital Stay: Payer: Medicare HMO | Attending: Oncology | Admitting: Oncology

## 2019-07-13 VITALS — BP 192/72 | HR 63 | Temp 97.5°F | Resp 18 | Wt 206.0 lb

## 2019-07-13 DIAGNOSIS — E1122 Type 2 diabetes mellitus with diabetic chronic kidney disease: Secondary | ICD-10-CM | POA: Insufficient documentation

## 2019-07-13 DIAGNOSIS — Z87891 Personal history of nicotine dependence: Secondary | ICD-10-CM | POA: Insufficient documentation

## 2019-07-13 DIAGNOSIS — N611 Abscess of the breast and nipple: Secondary | ICD-10-CM | POA: Diagnosis not present

## 2019-07-13 DIAGNOSIS — Z9079 Acquired absence of other genital organ(s): Secondary | ICD-10-CM | POA: Diagnosis not present

## 2019-07-13 DIAGNOSIS — Z801 Family history of malignant neoplasm of trachea, bronchus and lung: Secondary | ICD-10-CM | POA: Diagnosis not present

## 2019-07-13 DIAGNOSIS — N184 Chronic kidney disease, stage 4 (severe): Secondary | ICD-10-CM | POA: Diagnosis not present

## 2019-07-13 DIAGNOSIS — M2550 Pain in unspecified joint: Secondary | ICD-10-CM | POA: Insufficient documentation

## 2019-07-13 DIAGNOSIS — Z800 Family history of malignant neoplasm of digestive organs: Secondary | ICD-10-CM | POA: Diagnosis not present

## 2019-07-13 DIAGNOSIS — Z79899 Other long term (current) drug therapy: Secondary | ICD-10-CM | POA: Insufficient documentation

## 2019-07-13 DIAGNOSIS — Z87442 Personal history of urinary calculi: Secondary | ICD-10-CM | POA: Diagnosis not present

## 2019-07-13 DIAGNOSIS — Z803 Family history of malignant neoplasm of breast: Secondary | ICD-10-CM | POA: Insufficient documentation

## 2019-07-13 DIAGNOSIS — M19049 Primary osteoarthritis, unspecified hand: Secondary | ICD-10-CM | POA: Insufficient documentation

## 2019-07-13 DIAGNOSIS — I129 Hypertensive chronic kidney disease with stage 1 through stage 4 chronic kidney disease, or unspecified chronic kidney disease: Secondary | ICD-10-CM | POA: Insufficient documentation

## 2019-07-13 DIAGNOSIS — D0511 Intraductal carcinoma in situ of right breast: Secondary | ICD-10-CM | POA: Insufficient documentation

## 2019-07-22 ENCOUNTER — Other Ambulatory Visit: Payer: Self-pay

## 2019-07-22 MED ORDER — PANTOPRAZOLE SODIUM 40 MG PO TBEC: DELAYED_RELEASE_TABLET | ORAL | 1 refills | Status: DC

## 2019-07-26 ENCOUNTER — Other Ambulatory Visit: Payer: Self-pay

## 2019-07-26 MED ORDER — LEVEMIR FLEXTOUCH 100 UNIT/ML ~~LOC~~ SOPN: 70.0000 [IU] | PEN_INJECTOR | Freq: Every day | SUBCUTANEOUS | 3 refills | Status: DC

## 2019-08-03 DIAGNOSIS — H34832 Tributary (branch) retinal vein occlusion, left eye, with macular edema: Secondary | ICD-10-CM | POA: Diagnosis not present

## 2019-08-09 ENCOUNTER — Other Ambulatory Visit: Payer: Self-pay

## 2019-08-12 ENCOUNTER — Telehealth: Payer: Self-pay

## 2019-08-12 NOTE — Telephone Encounter (Signed)
Confirmed and screened for 08-16-19 ov.

## 2019-08-16 ENCOUNTER — Other Ambulatory Visit: Payer: Self-pay | Admitting: Nurse Practitioner

## 2019-08-16 ENCOUNTER — Ambulatory Visit (INDEPENDENT_AMBULATORY_CARE_PROVIDER_SITE_OTHER): Payer: Medicare HMO | Admitting: Nurse Practitioner

## 2019-08-16 ENCOUNTER — Encounter: Payer: Self-pay | Admitting: Nurse Practitioner

## 2019-08-16 ENCOUNTER — Other Ambulatory Visit: Payer: Self-pay

## 2019-08-16 VITALS — BP 171/70 | HR 60 | Temp 97.2°F | Resp 16 | Ht 63.0 in | Wt 206.6 lb

## 2019-08-16 DIAGNOSIS — Z794 Long term (current) use of insulin: Secondary | ICD-10-CM

## 2019-08-16 DIAGNOSIS — D509 Iron deficiency anemia, unspecified: Secondary | ICD-10-CM | POA: Diagnosis not present

## 2019-08-16 DIAGNOSIS — L659 Nonscarring hair loss, unspecified: Secondary | ICD-10-CM | POA: Insufficient documentation

## 2019-08-16 DIAGNOSIS — E559 Vitamin D deficiency, unspecified: Secondary | ICD-10-CM | POA: Diagnosis not present

## 2019-08-16 DIAGNOSIS — E1165 Type 2 diabetes mellitus with hyperglycemia: Secondary | ICD-10-CM

## 2019-08-16 DIAGNOSIS — R079 Chest pain, unspecified: Secondary | ICD-10-CM | POA: Diagnosis not present

## 2019-08-16 DIAGNOSIS — Z0001 Encounter for general adult medical examination with abnormal findings: Secondary | ICD-10-CM | POA: Diagnosis not present

## 2019-08-16 DIAGNOSIS — N184 Chronic kidney disease, stage 4 (severe): Secondary | ICD-10-CM | POA: Diagnosis not present

## 2019-08-16 DIAGNOSIS — Z118 Encounter for screening for other infectious and parasitic diseases: Secondary | ICD-10-CM | POA: Diagnosis not present

## 2019-08-16 DIAGNOSIS — I10 Essential (primary) hypertension: Secondary | ICD-10-CM

## 2019-08-16 DIAGNOSIS — D510 Vitamin B12 deficiency anemia due to intrinsic factor deficiency: Secondary | ICD-10-CM | POA: Diagnosis not present

## 2019-08-16 DIAGNOSIS — M184 Other bilateral secondary osteoarthritis of first carpometacarpal joints: Secondary | ICD-10-CM | POA: Diagnosis not present

## 2019-08-16 LAB — POCT GLYCOSYLATED HEMOGLOBIN (HGB A1C): Hemoglobin A1C: 9.1 % — AB (ref 4.0–5.6)

## 2019-08-16 MED ORDER — VERAPAMIL HCL 80 MG PO TABS: ORAL_TABLET | ORAL | 3 refills | Status: DC

## 2019-08-16 MED ORDER — INSULIN LISPRO (1 UNIT DIAL) 100 UNIT/ML (KWIKPEN): PEN_INJECTOR | SUBCUTANEOUS | 11 refills | Status: DC

## 2019-08-16 NOTE — Telephone Encounter (Signed)
Can you get this pt a prior auth?

## 2019-08-16 NOTE — Progress Notes (Signed)
[facility] [place], [place] [address]  Internal MEDICINE  Office Visit Note  Patient Name: Kara [name]  [id number]  [id number]  Date of Service: 08/16/2019  Chief Complaint  Patient presents with  . Diabetes  . Hypertension  . Hyperlipidemia  . Arthritis  . Anemia  . Dizziness    "whirring head noise" , started since she was in the hospital last december   . Alopecia    unsure if it is due to tamoxifen   . Insomnia    hard falling and staying asleep, hard to stay awake some days     The patient is here for follow up visit. She is having multiple symptoms. She is fatigued. She has generalized joint pain, hair is falling out, blood pressure is high, and she has headaches. She states that these symptoms really just got bad over the past few weeks. She had appointment with her oncologist on 07/13/2019 and symptoms have gradually become worse since then. There is possibility that symptoms may be coming from tamoxifen. All symptoms are listed as common side effects.  Blood sugars are continuing to run high. HgbA1c is 9.1 which is the same as it was at her last check. Discussed adding sliding scale insulin with her two largest meals of the day. She feels like she has done this before and was able to discontinue.  She states that she has been having episodes of chest pain. This has happened a few times over the past few weeks.       Current Medication: Outpatient Encounter Medications as of 08/16/2019  Medication Sig  . APPLE CIDER VINEGAR PO Take 2 tablets by mouth daily.   Marland Kitchen aspirin EC 81 MG tablet Take 81 mg by mouth daily.   . cholecalciferol (VITAMIN D) 1000 UNITS tablet Take 2,000 Units by mouth daily.   . Dulaglutide (TRULICITY) 1.5 YT/0.1SW SOPN INJECT 1.5 MG SUBCUTANEOUSLY ONCE WEEKLY.  . Ferrous Gluconate 324 (37.5 Fe) MG TABS Take 324 mg by mouth 2 (two) times daily.   . furosemide (LASIX) 20 MG tablet Take 20 mg by mouth daily.  . insulin  detemir (LEVEMIR FLEXTOUCH) 100 UNIT/ML FlexPen Inject 70 Units into the skin daily after supper.  . Lactobacillus (PROBIOTIC ACIDOPHILUS) CAPS Take 1 capsule by mouth daily.  . Multiple Vitamin (MULTIVITAMIN) tablet Take 1 tablet by mouth daily at 12 noon.  . pantoprazole (PROTONIX) 40 MG tablet TAKE 1 TABLET(S) BY MOUTH DAILY  . pregabalin (LYRICA) 100 MG capsule Take 1 capsule (100 mg total) by mouth 3 (three) times daily.  . rosuvastatin (CRESTOR) 20 MG tablet TAKE 1 TABLET BY MOUTH EVERYDAY AT BEDTIME  . tamoxifen (NOLVADEX) 20 MG tablet Take 1 tablet (20 mg total) by mouth daily.  . traMADol-acetaminophen (ULTRACET) 37.5-325 MG tablet Take 1 tablet by mouth 2 (two) times daily as needed for moderate pain.  . verapamil (CALAN) 80 MG tablet Take 1 tablet po bid  . [DISCONTINUED] verapamil (CALAN) 40 MG tablet TAKE 1 TABLET(S) BY MOUTH TWICE A DAY FOR HYPERTENSION  . insulin lispro (HUMALOG KWIKPEN) 100 UNIT/ML KwikPen Inject [place] twice daily as directed per sliding scale. Max daily dose is 30 units - E11.65   No facility-administered encounter medications on file as of 08/16/2019.    Surgical History: Past Surgical History:  Procedure Laterality Date  . BILATERAL SALPINGECTOMY    . BREAST BIOPSY Right 2014   neg- core  . BREAST BIOPSY Right 12/21/2018   [place] bx,  venus clip,  DUCTAL CARCINOMA IN SITU  . BREAST BIOPSY Right 12/21/2018   [place] bx, ribbon clip,  FIBROEPITHELIAL PROLIFERATION WITH SCLEROSIS  . BREAST BIOPSY Right 01/01/2019   Affirm bx "X" clip-path pending  . BREAST DUCTAL SYSTEM EXCISION    . BREAST EXCISIONAL BIOPSY Right 2014   neg  . BREAST LUMPECTOMY Right 01/22/2019   path pending  . CATARACT EXTRACTION    . CHOLECYSTECTOMY    . CYSTOSCOPY WITH STENT PLACEMENT Left 04/14/2015   Procedure: CYSTOSCOPY WITH STENT PLACEMENT;  Surgeon: Hollice Espy, MD;  Location: [facility];  Service: Urology;  Laterality: Left;  . EXTRACORPOREAL SHOCK WAVE LITHOTRIPSY Left 09/29/2014    Procedure: EXTRACORPOREAL SHOCK WAVE LITHOTRIPSY (ESWL);  Surgeon: Hollice Espy, MD;  Location: [facility];  Service: Urology;  Laterality: Left;  . EYE SURGERY     bilateral cataract  . IRRIGATION AND DEBRIDEMENT ABSCESS Right 03/24/2019   Procedure: IRRIGATION AND DEBRIDEMENT ABSCESS RIGHT BREAST;  Surgeon: Herbert Pun, MD;  Location: [facility];  Service: General;  Laterality: Right;  . LAPAROSCOPIC OOPHERECTOMY Left   . PARTIAL MASTECTOMY WITH NEEDLE LOCALIZATION Right 01/22/2019   Procedure: PARTIAL MASTECTOMY WITH NEEDLE LOCALIZATION;  Surgeon: Herbert Pun, MD;  Location: [facility];  Service: General;  Laterality: Right;  . TONSILLECTOMY    . TUBAL LIGATION    . URETEROSCOPY WITH HOLMIUM LASER LITHOTRIPSY Left 04/14/2015   Procedure: URETEROSCOPY WITH HOLMIUM LASER LITHOTRIPSY;  Surgeon: Hollice Espy, MD;  Location: [facility];  Service: Urology;  Laterality: Left;    Medical History: Past Medical History:  Diagnosis Date  . Anemia   . Arthropathy   . Benign neoplasm of breast   . Chest pain   . Chronic kidney disease    30% kidney function  . Diabetes ([place])   . Diabetic retinopathy ([place])   . Dyspnea   . Edema   . History of kidney stones   . Hyperlipemia   . Hypersomnia with sleep apnea   . Hypertension   . 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 Lumbago   . Osteoarthrosis   . Ovarian failure   . SOB (shortness of breath)     Family History: Family History  Problem Relation Age of Onset  . Breast cancer Maternal Grandmother 43  . Colon cancer Mother   . Lung cancer Father     Social History   Socioeconomic History  . Marital status: Married    Spouse name: Not on file  . Number of children: Not on file  . Years of education: Not on file  . Highest education level: Not on file  Occupational History  . Not on file  Tobacco Use  . Smoking status: Former Smoker    Packs/day: 1.00    Years: 30.00    Pack years: 30.00    Types:  Cigarettes    Quit date: 04/11/1995    Years since quitting: 24.3  . Smokeless tobacco: Never Used  Substance and Sexual Activity  . Alcohol use: No    Alcohol/week: 0.0 standard drinks  . Drug use: No  . Sexual activity: Not on file  Other Topics Concern  . Not on file  Social History Narrative  . Not on file   Social Determinants of Health   Financial Resource Strain:   . Difficulty of Paying Living Expenses:   Food Insecurity:   . Worried About Charity fundraiser in the Last Year:   . [place] in the  Last Year:   Transportation Needs:   . Film/video editor (Medical):   Marland Kitchen Lack of Transportation (Non-Medical):   Physical Activity:   . Days of Exercise per Week:   . Minutes of Exercise per Session:   Stress:   . Feeling of Stress :   Social Connections:   . Frequency of Communication with Friends and Family:   . Frequency of Social Gatherings with Friends and Family:   . Attends Religious Services:   . Active Member of Clubs or Organizations:   . Attends Archivist Meetings:   Marland Kitchen Marital Status:   Intimate Partner Violence:   . Fear of Current or Ex-Partner:   . Emotionally Abused:   Marland Kitchen Physically Abused:   . Sexually Abused:       Review of Systems  Constitutional: Positive for fatigue. Negative for chills and unexpected weight change.  HENT: Negative for congestion, postnasal drip, rhinorrhea, sneezing and sore throat.   Respiratory: Negative for cough, chest tightness, shortness of breath and wheezing.   Cardiovascular: Positive for chest pain. Negative for palpitations.       Consistently elevated blood pressure.   Gastrointestinal: Negative for abdominal pain, constipation, diarrhea, nausea and vomiting.  Endocrine: Negative for cold intolerance, heat intolerance, polydipsia and polyuria.       Blood sugars continue to be elevated   Musculoskeletal: Negative for arthralgias, back pain, joint swelling and neck pain.  Skin: Negative for  rash.  Allergic/Immunologic: Negative for environmental allergies.  Neurological: Positive for headaches. Negative for dizziness, tremors and numbness.       Reports an occasional headache since she started taking tamoxifen.   Hematological: Negative for adenopathy. Does not bruise/bleed easily.  Psychiatric/Behavioral: Negative for behavioral problems (Depression), sleep disturbance and suicidal ideas. The patient is nervous/anxious.    Today's Vitals   08/16/19 0855  BP: (!) 171/70  Pulse: 60  Resp: 16  Temp: (!) 97.2 F (36.2 C)  Weight: 206 lb 9.6 oz (93.7 kg)  Height: 5\' 3"  (1.6 m)   Body mass index is 36.6 kg/m.  Physical Exam Vitals and nursing note reviewed.  Constitutional:      General: She is not in acute distress.    Appearance: Normal appearance. She is well-developed. She is not diaphoretic.  HENT:     Head: Normocephalic and atraumatic.     Nose: Nose normal.     Mouth/Throat:     Pharynx: No oropharyngeal exudate.  Eyes:     Conjunctiva/sclera: Conjunctivae normal.     Pupils: Pupils are equal, round, and reactive to light.  Neck:     Thyroid: No thyromegaly.     Vascular: No carotid bruit or JVD.     Trachea: No tracheal deviation.  Cardiovascular:     Rate and Rhythm: Normal rate and regular rhythm.     Heart sounds: Normal heart sounds. No murmur. No friction rub. No gallop.      Comments: ECG in the office today is within normal limits. Pulmonary:     Effort: Pulmonary effort is normal. No respiratory distress.     Breath sounds: Normal breath sounds. No wheezing or rales.  Chest:     Chest wall: No tenderness.  Abdominal:     Palpations: Abdomen is soft.  Musculoskeletal:        General: Normal range of motion.     Cervical back: Normal range of motion and neck supple.  Lymphadenopathy:     Cervical: No cervical adenopathy.  Skin:    General: Skin is warm and dry.  Neurological:     Mental Status: She is alert and oriented to person, place,  and time.     Cranial Nerves: No cranial nerve deficit.  Psychiatric:        Attention and Perception: Attention and perception normal.        Mood and Affect: Affect normal. Mood is depressed.        Speech: Speech normal.        Behavior: Behavior normal. Behavior is cooperative.        Thought Content: Thought content normal.        Cognition and Memory: Cognition and memory normal.        Judgment: Judgment normal.    Assessment/Plan: 1. Type 2 diabetes mellitus with hyperglycemia, with long-term current use of insulin (HCC) - POCT HgB A1C 9.1 today. Start sliding scale insulin with two largest meals of the day. Written instructions were provided for the patient. She voiced understanding and agreement with the plan.  - insulin lispro (HUMALOG KWIKPEN) 100 UNIT/ML KwikPen; Inject [place] twice daily as directed per sliding scale. Max daily dose is 30 units - E11.65  Dispense: 15 mL; Refill: 11  2. Chest pain, unspecified type - EKG 12-Lead is within normal limits today.   3. Essential hypertension Increase verapamil to 80mg  twice daily. Advised she monitor this closely at home.  - verapamil (CALAN) 80 MG tablet; Take 1 tablet po bid  Dispense: 60 tablet; Refill: 3  4. CKD (chronic kidney disease), stage IV (HCC) Stable. Continue regular visits with nephrology as scheduled.   5. Alopecia Check labs including thyroid and anemia panels. Discussed alopecia being a possible side effect from tamoxifen. If labs come back ok, encouraged her to speak with oncologist about changing this to different medication. She agrees   General Counseling: Marwa verbalizes understanding of the findings of todays visit and agrees with plan of treatment. I have discussed any further diagnostic evaluation that may be needed or ordered today. We also reviewed her medications today. she has been encouraged to call the office with any questions or concerns that should arise related to todays visit.  Diabetes  Counseling:  1. Addition of ACE inh/ ARB'S for nephroprotection. Microalbumin is updated  2. Diabetic foot care, prevention of complications. Podiatry consult 3. Exercise and lose weight.  4. Diabetic eye examination, Diabetic eye exam is updated  5. Monitor blood sugar closlely. nutrition counseling.  6. Sign and symptoms of hypoglycemia including shaking sweating,confusion and headaches.  This patient was seen by Leretha Pol FNP Collaboration with Dr Lavera Guise as a part of collaborative care agreement  Orders Placed This Encounter  Procedures  . POCT HgB A1C  . EKG 12-Lead    Meds ordered this encounter  Medications  . verapamil (CALAN) 80 MG tablet    Sig: Take 1 tablet po bid    Dispense:  60 tablet    Refill:  3    Please note increased dose.    Order Specific Question:   Supervising Provider    Answer:   Lavera Guise [[id number]]  . insulin lispro (HUMALOG KWIKPEN) 100 UNIT/ML KwikPen    Sig: Inject [place] twice daily as directed per sliding scale. Max daily dose is 30 units - E11.65    Dispense:  15 mL    Refill:  11    Please fill with alternative preferred per her insurance.    Order Specific Question:  Supervising Provider    Answer:   Lavera Guise [[id number]]    Total time spent: 44 Minutes   Time spent includes review of chart, medications, test results, and follow up plan with the patient.      Dr Lavera Guise Internal medicine

## 2019-08-17 LAB — T4, FREE: Free T4: 1.35 ng/dL (ref 0.82–1.77)

## 2019-08-17 LAB — COMPREHENSIVE METABOLIC PANEL
ALT: 25 IU/L (ref 0–32)
AST: 27 IU/L (ref 0–40)
Albumin/Globulin Ratio: 1.2 (ref 1.2–2.2)
Albumin: 4 g/dL (ref 3.7–4.7)
Alkaline Phosphatase: 90 IU/L (ref 39–117)
BUN/Creatinine Ratio: 17 (ref 12–28)
BUN: 25 mg/dL (ref 8–27)
Bilirubin Total: 0.3 mg/dL (ref 0.0–1.2)
CO2: 27 mmol/L (ref 20–29)
Calcium: 9.3 mg/dL (ref 8.7–10.3)
Chloride: 97 mmol/L (ref 96–106)
Creatinine, Ser: 1.51 mg/dL — ABNORMAL HIGH (ref 0.57–1.00)
GFR calc Af Amer: 39 mL/min/{1.73_m2} — ABNORMAL LOW (ref >59)
GFR calc non Af Amer: 34 mL/min/{1.73_m2} — ABNORMAL LOW (ref >59)
Globulin, Total: 3.3 g/dL (ref 1.5–4.5)
Glucose: 180 mg/dL — ABNORMAL HIGH (ref 65–99)
Potassium: 4.2 mmol/L (ref 3.5–5.2)
Sodium: 138 mmol/L (ref 134–144)
Total Protein: 7.3 g/dL (ref 6.0–8.5)

## 2019-08-17 LAB — IRON AND TIBC
Iron Saturation: 16 % (ref 15–55)
Iron: 48 ug/dL (ref 27–139)
Total Iron Binding Capacity: 305 ug/dL (ref 250–450)
UIBC: 257 ug/dL (ref 118–369)

## 2019-08-17 LAB — CBC
Hematocrit: 36.3 % (ref 34.0–46.6)
Hemoglobin: 11.9 g/dL (ref 11.1–15.9)
MCH: 27.9 pg (ref 26.6–33.0)
MCHC: 32.8 g/dL (ref 31.5–35.7)
MCV: 85 fL (ref 79–97)
Platelets: 155 10*3/uL (ref 150–450)
RBC: 4.27 x10E6/uL (ref 3.77–5.28)
RDW: 15.5 % — ABNORMAL HIGH (ref 11.7–15.4)
WBC: 5.5 10*3/uL (ref 3.4–10.8)

## 2019-08-17 LAB — B12 AND FOLATE PANEL
Folate: >20 ng/mL (ref >3.0)
Vitamin B-12: 1780 pg/mL — ABNORMAL HIGH (ref 232–1245)

## 2019-08-17 LAB — LIPID PANEL WITH LDL/HDL RATIO
Cholesterol, Total: 170 mg/dL (ref 100–199)
HDL: 44 mg/dL (ref >39)
LDL Chol Calc (NIH): 72 mg/dL (ref 0–99)
LDL/HDL Ratio: 1.6 ratio (ref 0.0–3.2)
Triglycerides: 340 mg/dL — ABNORMAL HIGH (ref 0–149)
VLDL Cholesterol Cal: 54 mg/dL — ABNORMAL HIGH (ref 5–40)

## 2019-08-17 LAB — HEPATITIS C ANTIBODY (REFLEX): HCV Ab: 0.1 s/co ratio (ref 0.0–0.9)

## 2019-08-17 LAB — FERRITIN: Ferritin: 186 ng/mL — ABNORMAL HIGH (ref 15–150)

## 2019-08-17 LAB — VITAMIN D 25 HYDROXY (VIT D DEFICIENCY, FRACTURES): Vit D, 25-Hydroxy: 37.7 ng/mL (ref 30.0–100.0)

## 2019-08-17 LAB — HCV COMMENT:

## 2019-08-17 LAB — TSH: TSH: 1.75 u[IU]/mL (ref 0.450–4.500)

## 2019-08-17 LAB — T3: T3, Total: 120 ng/dL (ref 71–180)

## 2019-08-20 ENCOUNTER — Other Ambulatory Visit: Payer: Self-pay | Admitting: Nurse Practitioner

## 2019-08-20 ENCOUNTER — Other Ambulatory Visit: Payer: Self-pay

## 2019-08-20 DIAGNOSIS — Z794 Long term (current) use of insulin: Secondary | ICD-10-CM

## 2019-08-20 DIAGNOSIS — E1165 Type 2 diabetes mellitus with hyperglycemia: Secondary | ICD-10-CM

## 2019-08-20 MED ORDER — INSULIN LISPRO (1 UNIT DIAL) 100 UNIT/ML (KWIKPEN): PEN_INJECTOR | SUBCUTANEOUS | 11 refills | Status: DC

## 2019-08-20 NOTE — Telephone Encounter (Signed)
Humalog is not covered. Pt alternative is novolog. Please reivew.

## 2019-08-23 ENCOUNTER — Other Ambulatory Visit: Payer: Self-pay

## 2019-08-23 MED ORDER — TRULICITY 1.5 MG/0.5ML ~~LOC~~ SOAJ: SUBCUTANEOUS | 1 refills | Status: DC

## 2019-08-23 MED ORDER — NOVOLOG FLEXPEN 100 UNIT/ML ~~LOC~~ SOPN: PEN_INJECTOR | SUBCUTANEOUS | 3 refills | Status: DC

## 2019-08-23 NOTE — Telephone Encounter (Signed)
Send back

## 2019-09-14 DIAGNOSIS — M67441 Ganglion, right hand: Secondary | ICD-10-CM | POA: Diagnosis not present

## 2019-09-17 NOTE — Telephone Encounter (Signed)
Can you please tell pharmacy they can change to novolog with same dosing instructions. Thanks.

## 2019-09-21 DIAGNOSIS — N184 Chronic kidney disease, stage 4 (severe): Secondary | ICD-10-CM | POA: Diagnosis not present

## 2019-09-28 DIAGNOSIS — H34832 Tributary (branch) retinal vein occlusion, left eye, with macular edema: Secondary | ICD-10-CM | POA: Diagnosis not present

## 2019-09-30 DIAGNOSIS — E1122 Type 2 diabetes mellitus with diabetic chronic kidney disease: Secondary | ICD-10-CM | POA: Diagnosis not present

## 2019-09-30 DIAGNOSIS — I10 Essential (primary) hypertension: Secondary | ICD-10-CM | POA: Diagnosis not present

## 2019-09-30 DIAGNOSIS — R600 Localized edema: Secondary | ICD-10-CM | POA: Insufficient documentation

## 2019-09-30 DIAGNOSIS — N2581 Secondary hyperparathyroidism of renal origin: Secondary | ICD-10-CM | POA: Insufficient documentation

## 2019-09-30 DIAGNOSIS — N1832 Chronic kidney disease, stage 3b: Secondary | ICD-10-CM | POA: Diagnosis not present

## 2019-09-30 DIAGNOSIS — R8290 Unspecified abnormal findings in urine: Secondary | ICD-10-CM | POA: Diagnosis not present

## 2019-09-30 DIAGNOSIS — N184 Chronic kidney disease, stage 4 (severe): Secondary | ICD-10-CM | POA: Diagnosis not present

## 2019-09-30 DIAGNOSIS — D631 Anemia in chronic kidney disease: Secondary | ICD-10-CM | POA: Diagnosis not present

## 2019-10-07 ENCOUNTER — Ambulatory Visit (INDEPENDENT_AMBULATORY_CARE_PROVIDER_SITE_OTHER): Payer: Medicare HMO | Admitting: Nurse Practitioner

## 2019-10-07 ENCOUNTER — Telehealth: Payer: Self-pay | Admitting: *Deleted

## 2019-10-07 ENCOUNTER — Other Ambulatory Visit: Payer: Self-pay

## 2019-10-07 ENCOUNTER — Encounter: Payer: Self-pay | Admitting: Nurse Practitioner

## 2019-10-07 VITALS — BP 169/59 | HR 61 | Temp 97.0°F | Resp 16 | Ht 63.0 in | Wt 213.0 lb

## 2019-10-07 DIAGNOSIS — N184 Chronic kidney disease, stage 4 (severe): Secondary | ICD-10-CM

## 2019-10-07 DIAGNOSIS — R0609 Other forms of dyspnea: Secondary | ICD-10-CM

## 2019-10-07 DIAGNOSIS — L209 Atopic dermatitis, unspecified: Secondary | ICD-10-CM | POA: Diagnosis not present

## 2019-10-07 DIAGNOSIS — D0511 Intraductal carcinoma in situ of right breast: Secondary | ICD-10-CM

## 2019-10-07 DIAGNOSIS — R600 Localized edema: Secondary | ICD-10-CM | POA: Diagnosis not present

## 2019-10-07 DIAGNOSIS — E1142 Type 2 diabetes mellitus with diabetic polyneuropathy: Secondary | ICD-10-CM | POA: Diagnosis not present

## 2019-10-07 DIAGNOSIS — Z0001 Encounter for general adult medical examination with abnormal findings: Secondary | ICD-10-CM | POA: Diagnosis not present

## 2019-10-07 DIAGNOSIS — R0600 Dyspnea, unspecified: Secondary | ICD-10-CM

## 2019-10-07 DIAGNOSIS — R300 Dysuria: Secondary | ICD-10-CM | POA: Diagnosis not present

## 2019-10-07 MED ORDER — TRIAMCINOLONE ACETONIDE 0.025 % EX CREA: 1.0000 "application " | TOPICAL_CREAM | Freq: Two times a day (BID) | CUTANEOUS | 2 refills | Status: DC

## 2019-10-07 MED ORDER — PREGABALIN 100 MG PO CAPS: 100.0000 mg | ORAL_CAPSULE | Freq: Three times a day (TID) | ORAL | 3 refills | Status: DC

## 2019-10-07 NOTE — Telephone Encounter (Signed)
Call returned to patient and advised of Dr Gary Fleet response.

## 2019-10-07 NOTE — Progress Notes (Signed)
[facility] [place], [place] [address]  Internal MEDICINE  Office Visit Note  Patient Name: Kara [name]  [id number]  [id number]  Date of Service: 10/17/2019    Pt is here for routine health maintenance examination  Chief Complaint  Patient presents with   Medicare Wellness   Diabetes   Hypertension   Hyperlipidemia   Leg Swelling    Swelling in both legs; especially the left leg; also experiencing foot swelling   Rash    Rash on the back of the neck; irritated and itchy     The patient is here for health maintenance exam. She is having swelling in her lower legs and ankles. She also has a throbbing sensation in left lower leg. The left leg has higher degree of swelling than the right. She does have chronic kidney disease. She is on two different diuretics at this time. She also complains of shortness of breath with exertion. Her blood sugars have been running high, but gradually improving. Her last HgbA1c was 9.1.    Current Medication: Outpatient Encounter Medications as of 10/07/2019  Medication Sig Note   APPLE CIDER VINEGAR PO Take 2 tablets by mouth daily.     aspirin EC 81 MG tablet Take 81 mg by mouth daily.     cholecalciferol (VITAMIN D) 1000 UNITS tablet Take 2,000 Units by mouth daily.     Dulaglutide (TRULICITY) 1.5 AU/6.3FH SOPN INJECT 1.5 MG SUBCUTANEOUSLY ONCE WEEKLY.    Ferrous Gluconate 324 (37.5 Fe) MG TABS Take 324 mg by mouth 2 (two) times daily.     furosemide (LASIX) 20 MG tablet Take 20 mg by mouth daily.    insulin aspart (NOVOLOG FLEXPEN) 100 UNIT/ML FlexPen Inject [place] twice daily as directed per sliding scale. Max daily dose is 30 units. Dx E11.65.    insulin detemir (LEVEMIR FLEXTOUCH) 100 UNIT/ML FlexPen Inject 70 Units into the skin daily after supper.    Lactobacillus (PROBIOTIC ACIDOPHILUS) CAPS Take 1 capsule by mouth daily.    lisinopril (ZESTRIL) 10 MG tablet Take 10 mg by mouth daily.    Multiple  Vitamin (MULTIVITAMIN) tablet Take 1 tablet by mouth daily at 12 noon.    pantoprazole (PROTONIX) 40 MG tablet TAKE 1 TABLET(S) BY MOUTH DAILY    pregabalin (LYRICA) 100 MG capsule Take 1 capsule (100 mg total) by mouth 3 (three) times daily.    rosuvastatin (CRESTOR) 20 MG tablet TAKE 1 TABLET BY MOUTH EVERYDAY AT BEDTIME    tamoxifen (NOLVADEX) 20 MG tablet Take 1 tablet (20 mg total) by mouth daily.    traMADol-acetaminophen (ULTRACET) 37.5-325 MG tablet Take 1 tablet by mouth 2 (two) times daily as needed for moderate pain.    verapamil (CALAN) 80 MG tablet Take 1 tablet po bid    [DISCONTINUED] pregabalin (LYRICA) 100 MG capsule Take 1 capsule (100 mg total) by mouth 3 (three) times daily.    triamcinolone (KENALOG) 0.025 % cream Apply 1 application topically 2 (two) times daily.    [DISCONTINUED] cetirizine (ZYRTEC) 10 MG tablet Take 10 mg by mouth daily as needed for allergies.     [DISCONTINUED] dicyclomine (BENTYL) 10 MG capsule Take 1 capsule (10 mg total) by mouth 3 (three) times daily as needed for spasms.    [DISCONTINUED] furosemide (LASIX) 20 MG tablet Take 20 mg by mouth 2 (two) times daily as needed.    [DISCONTINUED] glimepiride (AMARYL) 4 MG tablet Take 1 tablet (4 mg total) by mouth 2 (two)  times daily with a meal. (Patient taking differently: Take 4 mg by mouth 2 (two) times daily. )    [DISCONTINUED] insulin detemir (LEVEMIR) 100 UNIT/ML injection Inject 75 units into the skin daily after supper    [DISCONTINUED] lisinopril (ZESTRIL) 40 MG tablet Take 40 mg by mouth daily.     [DISCONTINUED] loperamide (IMODIUM A-D) 2 MG tablet Take 2 mg by mouth as needed for diarrhea or loose stools.    [DISCONTINUED] NEOMYCIN-POLYMYXIN-HYDROCORTISONE (CORTISPORIN) 1 % SOLN otic solution Reported on 05/26/2015 05/26/2015: Received from: External Pharmacy   [DISCONTINUED] nystatin cream (MYCOSTATIN) Apply 1 application topically 2 (two) times daily. (Patient not taking: Reported  on 12/30/2018)    [DISCONTINUED] pantoprazole (PROTONIX) 40 MG tablet TAKE 1 TABLET(S) BY MOUTH DAILY (Patient taking differently: Take 40 mg by mouth daily. )    [DISCONTINUED] pregabalin (LYRICA) 100 MG capsule Take 1 capsule (100 mg total) by mouth 3 (three) times daily.    [DISCONTINUED] rosuvastatin (CRESTOR) 20 MG tablet TAKE 1 TABLET BY MOUTH EVERYDAY AT BEDTIME (Patient taking differently: Take 20 mg by mouth at bedtime. )    [DISCONTINUED] traMADol-acetaminophen (ULTRACET) 37.5-325 MG tablet Take 1 tablet by mouth 2 (two) times daily as needed for moderate pain.    [DISCONTINUED] TRULICITY 1.5 GY/1.8HU SOPN Inject 1.13m [place] once weekly    [DISCONTINUED] verapamil (CALAN) 40 MG tablet TAKE 1 TABLET(S) BY MOUTH TWICE A DAY FOR HYPERTENSION    No facility-administered encounter medications on file as of 10/07/2019.    Surgical History: Past Surgical History:  Procedure Laterality Date   BILATERAL SALPINGECTOMY     BREAST BIOPSY Right 2014   neg- core   BREAST BIOPSY Right 12/21/2018   UKoreabx, venus clip,  DUCTAL CARCINOMA IN SITU   BREAST BIOPSY Right 12/21/2018   [facility]oreabx, ribbon clip,  FIBROEPITHELIAL PROLIFERATION WITH SCLEROSIS   BREAST BIOPSY Right 01/01/2019   Affirm bx "X" clip-path pending   BREAST DUCTAL SYSTEM EXCISION     BREAST EXCISIONAL BIOPSY Right 2014   neg   BREAST LUMPECTOMY Right 01/22/2019   path pending   CATARACT EXTRACTION     CHOLECYSTECTOMY     CYSTOSCOPY WITH STENT PLACEMENT Left 04/14/2015   Procedure: CYSTOSCOPY WITH STENT PLACEMENT;  Surgeon: AHollice Espy MD;  Location: [facility];  Service: Urology;  Laterality: Left;   EXTRACORPOREAL SHOCK WAVE LITHOTRIPSY Left 09/29/2014   Procedure: EXTRACORPOREAL SHOCK WAVE LITHOTRIPSY (ESWL);  Surgeon: AHollice Espy MD;  Location: [facility];  Service: Urology;  Laterality: Left;   EYE SURGERY     bilateral cataract   IRRIGATION AND DEBRIDEMENT ABSCESS Right 03/24/2019   Procedure: IRRIGATION  AND DEBRIDEMENT ABSCESS RIGHT BREAST;  Surgeon: CHerbert Pun MD;  Location: [facility];  Service: General;  Laterality: Right;   LAPAROSCOPIC OOPHERECTOMY Left    PARTIAL MASTECTOMY WITH NEEDLE LOCALIZATION Right 01/22/2019   Procedure: PARTIAL MASTECTOMY WITH NEEDLE LOCALIZATION;  Surgeon: CHerbert Pun MD;  Location: [facility];  Service: General;  Laterality: Right;   TONSILLECTOMY     TUBAL LIGATION     URETEROSCOPY WITH HOLMIUM LASER LITHOTRIPSY Left 04/14/2015   Procedure: URETEROSCOPY WITH HOLMIUM LASER LITHOTRIPSY;  Surgeon: AHollice Espy MD;  Location: [facility];  Service: Urology;  Laterality: Left;    Medical History: Past Medical History:  Diagnosis Date   Anemia    Arthropathy    Benign neoplasm of breast    Chest pain    Chronic kidney disease    30% kidney function   Diabetes (  [place])    Diabetic retinopathy ([place])    Dyspnea    Edema    History of kidney stones    Hyperlipemia    Hypersomnia with sleep apnea    Hypertension    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HCC)    Lumbago    Osteoarthrosis    Ovarian failure    SOB (shortness of breath)     Family History: Family History  Problem Relation Age of Onset   Breast cancer Maternal Grandmother 30   Colon cancer Mother    Lung cancer Father       Review of Systems  Constitutional: Positive for activity change and fatigue. Negative for chills and unexpected weight change.  HENT: Negative for congestion, postnasal drip, rhinorrhea, sneezing and sore throat.   Respiratory: Negative for cough, chest tightness, shortness of breath and wheezing.   Cardiovascular: Positive for chest pain and leg swelling. Negative for palpitations.       Consistently elevated blood pressure.   Gastrointestinal: Negative for abdominal pain, constipation, diarrhea, nausea and vomiting.  Endocrine: Negative for cold intolerance, heat intolerance, polydipsia and polyuria.       Blood sugars  continue to be elevated   Genitourinary: Negative for dysuria, hematuria and urgency.  Musculoskeletal: Negative for arthralgias, back pain, joint swelling and neck pain.  Skin: Negative for rash.  Allergic/Immunologic: Negative for environmental allergies.  Neurological: Positive for weakness and headaches. Negative for dizziness, tremors and numbness.       Reports an occasional headache since she started taking tamoxifen.   Hematological: Negative for adenopathy. Does not bruise/bleed easily.  Psychiatric/Behavioral: Negative for behavioral problems (Depression), sleep disturbance and suicidal ideas. The patient is nervous/anxious.      Today's Vitals   10/07/19 1008  BP: (!) 169/59  Pulse: 61  Resp: 16  Temp: (!) 97 F (36.1 C)  SpO2: 96%  Weight: 213 lb (96.6 kg)  Height: 5' 3"  (1.6 m)   Body mass index is 37.73 kg/m.  Physical Exam Vitals and nursing note reviewed.  Constitutional:      General: She is not in acute distress.    Appearance: Normal appearance. She is well-developed. She is obese. She is not diaphoretic.  HENT:     Head: Normocephalic and atraumatic.     Nose: Nose normal.     Mouth/Throat:     Pharynx: No oropharyngeal exudate.  Eyes:     Conjunctiva/sclera: Conjunctivae normal.     Pupils: Pupils are equal, round, and reactive to light.  Neck:     Thyroid: No thyromegaly.     Vascular: No carotid bruit or JVD.     Trachea: No tracheal deviation.  Cardiovascular:     Rate and Rhythm: Normal rate and regular rhythm.     Pulses:          Dorsalis pedis pulses are 1+ on the right side and 1+ on the left side.       Posterior tibial pulses are 1+ on the right side and 1+ on the left side.     Heart sounds: Normal heart sounds. No murmur heard.  No friction rub. No gallop.      Comments: ECG in the office today is within normal limits. Pulmonary:     Effort: Pulmonary effort is normal. No respiratory distress.     Breath sounds: Normal breath  sounds. No wheezing or rales.  Chest:     Chest wall: No tenderness.  Abdominal:     General:  Bowel sounds are normal.     Palpations: Abdomen is soft.     Tenderness: There is no abdominal tenderness.  Musculoskeletal:        General: Normal range of motion.     Cervical back: Normal range of motion and neck supple.     Right foot: Normal range of motion. No deformity or bunion.     Left foot: Normal range of motion. No deformity or bunion.  Feet:     Right foot:     Protective Sensation: 10 sites tested. 5 sites sensed.     Skin integrity: Skin integrity normal.     Toenail Condition: Right toenails are normal.     Left foot:     Protective Sensation: 10 sites tested. 5 sites sensed.     Skin integrity: Skin integrity normal.     Toenail Condition: Left toenails are normal.  Lymphadenopathy:     Cervical: No cervical adenopathy.  Skin:    General: Skin is warm and dry.  Neurological:     Mental Status: She is alert and oriented to person, place, and time.     Cranial Nerves: No cranial nerve deficit.  Psychiatric:        Attention and Perception: Attention and perception normal.        Mood and Affect: Affect normal. Mood is depressed.        Speech: Speech normal.        Behavior: Behavior normal. Behavior is cooperative.        Thought Content: Thought content normal.        Cognition and Memory: Cognition and memory normal.        Judgment: Judgment normal.    Depression screen [facility] 2/9 10/07/2019 08/16/2019 05/18/2019 01/07/2019 10/01/2018  Decreased Interest 0 0 0 0 0  Down, Depressed, Hopeless 0 0 0 1 0  PHQ - 2 Score 0 0 0 1 0    Functional Status Survey: Is the patient deaf or have difficulty hearing?: Yes Does the patient have difficulty seeing, even when wearing glasses/contacts?: Yes (diabetic retinothapy in left eye; gets injections) Does the patient have difficulty concentrating, remembering, or making decisions?: No Does the patient have difficulty walking or  climbing stairs?: Yes Does the patient have difficulty dressing or bathing?: No Does the patient have difficulty doing errands alone such as visiting a doctor's office or shopping?: Yes (does not drive)  MMSE - [place] Exam 10/07/2019 10/01/2018  Orientation to time 5 5  Orientation to Place 5 5  Registration 3 3  Attention/ Calculation 5 5  Recall 3 3  Language- name 2 objects 2 2  Language- repeat 1 1  Language- follow 3 step command 3 3  Language- read & follow direction 1 1  Write a sentence 1 1  Copy design 1 1  Total score 30 30    Fall Risk  10/07/2019 08/16/2019 05/18/2019 01/07/2019 10/01/2018  Falls in the past year? 0 0 0 0 0  Number falls in past yr: - - - - -  Injury with Fall? - - - - -    LABS: Recent Results (from the past 2160 hour(s))  POCT HgB A1C     Status: Abnormal   Collection Time: 08/16/19  9:12 AM  Result Value Ref Range   Hemoglobin A1C 9.1 (A) 4.0 - 5.6 %   HbA1c POC (<> result, manual entry)     HbA1c, POC (prediabetic range)     HbA1c,  POC (controlled diabetic range)    Comprehensive metabolic panel     Status: Abnormal   Collection Time: 08/16/19 10:35 AM  Result Value Ref Range   Glucose 180 (H) 65 - 99 mg/dL   BUN 25 8 - 27 mg/dL   Creatinine, Ser 1.51 (H) 0.57 - 1.00 mg/dL   GFR calc non Af Amer 34 (L) >59 mL/min/1.73   GFR calc Af Amer 39 (L) >59 mL/min/1.73    Comment: **[facility] currently reports eGFR in compliance with the current**   recommendations of the [employer]. [facility] will   update reporting as new guidelines are published from the NKF-ASN   Task force.    BUN/Creatinine Ratio 17 12 - 28   Sodium 138 134 - 144 mmol/L   Potassium 4.2 3.5 - 5.2 mmol/L   Chloride 97 96 - 106 mmol/L   CO2 27 20 - 29 mmol/L   Calcium 9.3 8.7 - 10.3 mg/dL   Total Protein 7.3 6.0 - 8.5 g/dL   Albumin 4.0 3.7 - 4.7 g/dL   Globulin, Total 3.3 1.5 - 4.5 g/dL   Albumin/Globulin Ratio 1.2 1.2 - 2.2   Bilirubin Total 0.3 0.0  - 1.2 mg/dL   Alkaline Phosphatase 90 39 - 117 IU/L   AST 27 0 - 40 IU/L   ALT 25 0 - 32 IU/L  CBC     Status: Abnormal   Collection Time: 08/16/19 10:35 AM  Result Value Ref Range   WBC 5.5 3.4 - 10.8 x10E3/uL   RBC 4.27 3.77 - 5.28 x10E6/uL   Hemoglobin 11.9 11.1 - 15.9 g/dL   Hematocrit 36.3 34.0 - 46.6 %   MCV 85 79 - 97 fL   MCH 27.9 26.6 - 33.0 pg   MCHC 32.8 31 - 35 g/dL   RDW 15.5 (H) 11.7 - 15.4 %   Platelets 155 150 - 450 x10E3/uL  Lipid Panel With LDL/HDL Ratio     Status: Abnormal   Collection Time: 08/16/19 10:35 AM  Result Value Ref Range   Cholesterol, Total 170 100 - 199 mg/dL   Triglycerides 340 (H) 0 - 149 mg/dL   HDL 44 >39 mg/dL   VLDL Cholesterol Cal 54 (H) 5 - 40 mg/dL   LDL Chol Calc (NIH) 72 0 - 99 mg/dL   LDL/HDL Ratio 1.6 0.0 - 3.2 ratio    Comment:                                     LDL/HDL Ratio                                             Men  Women                               1/2 Avg.Risk  1.0    1.5                                   Avg.Risk  3.6    3.2  2X Avg.Risk  6.2    5.0                                3X Avg.Risk  8.0    6.1   Iron and TIBC     Status: None   Collection Time: 08/16/19 10:35 AM  Result Value Ref Range   Total Iron Binding Capacity 305 250 - 450 ug/dL   UIBC 257 118 - 369 ug/dL   Iron 48 27 - 139 ug/dL   Iron Saturation 16 15 - 55 %  B12 and Folate Panel     Status: Abnormal   Collection Time: 08/16/19 10:35 AM  Result Value Ref Range   Vitamin B-12 1,780 (H) 232 - 1,245 pg/mL   Folate >20.0 >3.0 ng/mL    Comment: A serum folate concentration of less than 3.1 ng/mL is considered to represent clinical deficiency.   T4, free     Status: None   Collection Time: 08/16/19 10:35 AM  Result Value Ref Range   Free T4 1.35 0.82 - 1.77 ng/dL  TSH     Status: None   Collection Time: 08/16/19 10:35 AM  Result Value Ref Range   TSH 1.750 0.450 - 4.500 uIU/mL  VITAMIN D 25 Hydroxy (Vit-D  Deficiency, Fractures)     Status: None   Collection Time: 08/16/19 10:35 AM  Result Value Ref Range   Vit D, 25-Hydroxy 37.7 30.0 - 100.0 ng/mL    Comment: Vitamin D deficiency has been defined by the [place] and an Endocrine Society practice guideline as a level of serum 25-OH vitamin D less than 20 ng/mL (1,2). The Endocrine Society went on to further define vitamin D insufficiency as a level between 21 and 29 ng/mL (2). 1. IOM (Institute of Medicine). 2010. Dietary reference    intakes for calcium and D. [place]: The    [employer]. 2. Holick MF, Binkley , Bischoff-Ferrari HA, et al.    Evaluation, treatment, and prevention of vitamin D    deficiency: an Endocrine Society clinical practice    guideline. JCEM. 2011 Jul; 96(7):1911-30.   Hepatitis c antibody (reflex)     Status: None   Collection Time: 08/16/19 10:35 AM  Result Value Ref Range   HCV Ab 0.1 0.0 - 0.9 s/co ratio  T3     Status: None   Collection Time: 08/16/19 10:35 AM  Result Value Ref Range   T3, Total 120 71 - 180 ng/dL  Ferritin     Status: Abnormal   Collection Time: 08/16/19 10:35 AM  Result Value Ref Range   Ferritin 186 (H) 15.0 - 150.0 ng/mL  HCV Comment:     Status: None   Collection Time: 08/16/19 10:35 AM  Result Value Ref Range   Comment: Comment     Comment: Non reactive HCV antibody screen is consistent with no HCV infection, unless recent infection is suspected or other evidence exists to indicate HCV infection.   Urinalysis, Routine w reflex microscopic     Status: Abnormal   Collection Time: 10/07/19 10:10 AM  Result Value Ref Range   Specific Gravity, UA 1.014 1.005 - 1.030   pH, UA 5.5 5.0 - 7.5   Color, UA Yellow Yellow   Appearance Ur Clear Clear   Leukocytes,UA 1+ (A) Negative   Protein,UA Negative Negative/Trace   Glucose, UA Negative Negative   Ketones, UA Negative Negative  RBC, UA Negative Negative   Bilirubin, UA Negative Negative    Urobilinogen, Ur 0.2 0.2 - 1.0 mg/dL   Nitrite, UA Negative Negative   Microscopic Examination See below:     Comment: Microscopic was indicated and was performed.  Microscopic Examination     Status: Abnormal   Collection Time: 10/07/19 10:10 AM   Urine  Result Value Ref Range   WBC, UA 6-10 (A) 0 - 5 /hpf   RBC 3-10 (A) 0 - 2 /hpf   Epithelial Cells (non renal) 0-10 0 - 10 /hpf   Casts None seen None seen /lpf   Bacteria, UA None seen None seen/Few    Assessment/Plan: 1. Encounter for general adult medical examination with abnormal findings Annual health maintenance exam today.  2. Type 2 diabetes mellitus with peripheral neuropathy (HCC) Worsening neuropathy. Increase lyrica to 159m up to three times daily as needed for worsening neuropathy. New prescrition sent to her pharmacy today.  - pregabalin (LYRICA) 100 MG capsule; Take 1 capsule (100 mg total) by mouth 3 (three) times daily.  Dispense: 90 capsule; Refill: 3  3. Bilateral lower extremity edema Will get vascular ultrasound of bilateral lower extremities. Will also get echocardiogram for further evaluation.  - VAS [place] EXTREMITY VENOUS REFLUX; Future - ECHOCARDIOGRAM COMPLETE; Future  4. Dyspnea on exertion Echocardiogram ordered for further evaluation.  - ECHOCARDIOGRAM COMPLETE; Future  5. Atopic dermatitis, unspecified type Add tra=iamcinolone 0.025% cream. Apply to affected areas twice daily as needed.  - triamcinolone (KENALOG) 0.025 % cream; Apply 1 application topically 2 (two) times daily.  Dispense: 80 g; Refill: 2  6. CKD (chronic kidney disease), stage IV (HCC) Worsening. Patient will continue to see nephrology as scheduled.   7. Ductal carcinoma in situ (DCIS) of right breast Regular visits with oncology as scheduled.   8. Dysuria - Urinalysis, Routine w reflex microscopic  General Counseling: Harumi verbalizes understanding of the findings of todays visit and agrees with plan of treatment. I have  discussed any further diagnostic evaluation that may be needed or ordered today. We also reviewed her medications today. she has been encouraged to call the office with any questions or concerns that should arise related to todays visit.    Counseling:  Diabetes Counseling:  1. Addition of ACE inh/ ARB'S for nephroprotection. Microalbumin is updated  2. Diabetic foot care, prevention of complications. Podiatry consult 3. Exercise and lose weight.  4. Diabetic eye examination, Diabetic eye exam is updated  5. Monitor blood sugar closlely. nutrition counseling.  6. Sign and symptoms of hypoglycemia including shaking sweating,confusion and headaches.  This patient was seen by HLeretha PolFNP Collaboration with Dr FLavera Guiseas a part of collaborative care agreement  Orders Placed This Encounter  Procedures   Microscopic Examination   Urinalysis, Routine w reflex microscopic   ECHOCARDIOGRAM COMPLETE   VAS [place] EXTREMITY VENOUS REFLUX    Meds ordered this encounter  Medications   triamcinolone (KENALOG) 0.025 % cream    Sig: Apply 1 application topically 2 (two) times daily.    Dispense:  80 g    Refill:  2    Order Specific Question:   Supervising Provider    Answer:   KLavera Guise[[id number]]   pregabalin (LYRICA) 100 MG capsule    Sig: Take 1 capsule (100 mg total) by mouth 3 (three) times daily.    Dispense:  90 capsule    Refill:  3    Order Specific  Question:   Supervising Provider    Answer:   Lavera Guise [[id number]]    Total time spent:45 Minutes  Time spent includes review of chart, medications, test results, and follow up plan with the patient.     Lavera Guise, MD  Internal Medicine

## 2019-10-07 NOTE — Telephone Encounter (Signed)
Patient called reporting that she went to see her PCP today and that she is having swelling in her feet and legs as well as dizziness and her PCP told her that the Tamoxifen could be causing this and for her to check with Dr Grayland Ormond to see if he wants to change her to something else. Please advise

## 2019-10-07 NOTE — Telephone Encounter (Signed)
I would be surprised if tamoxifen is causing these aide effects 6 months after initiate treatment. I would recommend the PCP search for another etiology first.

## 2019-10-08 LAB — URINALYSIS, ROUTINE W REFLEX MICROSCOPIC
Bilirubin, UA: NEGATIVE
Glucose, UA: NEGATIVE
Ketones, UA: NEGATIVE
Nitrite, UA: NEGATIVE
Protein,UA: NEGATIVE
RBC, UA: NEGATIVE
Specific Gravity, UA: 1.014 (ref 1.005–1.030)
Urobilinogen, Ur: 0.2 mg/dL (ref 0.2–1.0)
pH, UA: 5.5 (ref 5.0–7.5)

## 2019-10-08 LAB — MICROSCOPIC EXAMINATION
Bacteria, UA: NONE SEEN
Casts: NONE SEEN /lpf

## 2019-10-08 NOTE — Progress Notes (Signed)
Patient does see nephrology.

## 2019-10-17 DIAGNOSIS — R600 Localized edema: Secondary | ICD-10-CM | POA: Insufficient documentation

## 2019-10-17 DIAGNOSIS — Z0001 Encounter for general adult medical examination with abnormal findings: Secondary | ICD-10-CM | POA: Insufficient documentation

## 2019-10-17 DIAGNOSIS — L209 Atopic dermatitis, unspecified: Secondary | ICD-10-CM | POA: Insufficient documentation

## 2019-10-26 ENCOUNTER — Other Ambulatory Visit: Payer: Self-pay

## 2019-10-26 DIAGNOSIS — M064 Inflammatory polyarthropathy: Secondary | ICD-10-CM

## 2019-10-27 MED ORDER — TRAMADOL-ACETAMINOPHEN 37.5-325 MG PO TABS: 1.0000 | ORAL_TABLET | Freq: Two times a day (BID) | ORAL | 2 refills | Status: DC | PRN

## 2019-11-11 ENCOUNTER — Other Ambulatory Visit: Payer: Self-pay

## 2019-11-11 MED ORDER — LEVEMIR FLEXTOUCH 100 UNIT/ML ~~LOC~~ SOPN: 70.0000 [IU] | PEN_INJECTOR | Freq: Every day | SUBCUTANEOUS | 3 refills | Status: DC

## 2019-11-12 ENCOUNTER — Other Ambulatory Visit: Payer: Self-pay

## 2019-11-12 ENCOUNTER — Ambulatory Visit: Payer: Medicare HMO

## 2019-11-12 DIAGNOSIS — R600 Localized edema: Secondary | ICD-10-CM

## 2019-11-12 DIAGNOSIS — M79606 Pain in leg, unspecified: Secondary | ICD-10-CM

## 2019-11-15 NOTE — Progress Notes (Signed)
Positive baker's cyst on right side - may require consult with orthopedics

## 2019-11-15 NOTE — Progress Notes (Signed)
Improved labs. Discuss at visit 11/26/2019

## 2019-11-19 ENCOUNTER — Ambulatory Visit: Payer: Medicare HMO

## 2019-11-19 ENCOUNTER — Other Ambulatory Visit: Payer: Self-pay

## 2019-11-19 DIAGNOSIS — R600 Localized edema: Secondary | ICD-10-CM

## 2019-11-19 DIAGNOSIS — R0609 Other forms of dyspnea: Secondary | ICD-10-CM

## 2019-11-19 DIAGNOSIS — R0602 Shortness of breath: Secondary | ICD-10-CM | POA: Diagnosis not present

## 2019-11-22 ENCOUNTER — Other Ambulatory Visit: Payer: Self-pay

## 2019-11-22 MED ORDER — TRULICITY 1.5 MG/0.5ML ~~LOC~~ SOAJ: SUBCUTANEOUS | 1 refills | Status: DC

## 2019-11-25 NOTE — Progress Notes (Signed)
Discuss echo

## 2019-11-26 ENCOUNTER — Ambulatory Visit: Payer: Medicare HMO | Admitting: Nurse Practitioner

## 2019-11-30 ENCOUNTER — Telehealth: Payer: Self-pay

## 2019-11-30 DIAGNOSIS — H34832 Tributary (branch) retinal vein occlusion, left eye, with macular edema: Secondary | ICD-10-CM | POA: Diagnosis not present

## 2019-11-30 DIAGNOSIS — E113293 Type 2 diabetes mellitus with mild nonproliferative diabetic retinopathy without macular edema, bilateral: Secondary | ICD-10-CM | POA: Diagnosis not present

## 2019-11-30 NOTE — Telephone Encounter (Signed)
No vm unable to confirm and screen for 12-02-19 ov.

## 2019-12-01 NOTE — Progress Notes (Signed)
Follow up scheduled 12/02/2019

## 2019-12-02 ENCOUNTER — Other Ambulatory Visit: Payer: Self-pay | Admitting: Nurse Practitioner

## 2019-12-02 ENCOUNTER — Encounter: Payer: Self-pay | Admitting: Nurse Practitioner

## 2019-12-02 ENCOUNTER — Ambulatory Visit (INDEPENDENT_AMBULATORY_CARE_PROVIDER_SITE_OTHER): Payer: Medicare HMO | Admitting: Nurse Practitioner

## 2019-12-02 ENCOUNTER — Other Ambulatory Visit: Payer: Self-pay

## 2019-12-02 VITALS — BP 169/63 | HR 66 | Temp 97.4°F | Resp 16 | Ht 63.0 in | Wt 213.4 lb

## 2019-12-02 DIAGNOSIS — E1165 Type 2 diabetes mellitus with hyperglycemia: Secondary | ICD-10-CM | POA: Diagnosis not present

## 2019-12-02 DIAGNOSIS — D0511 Intraductal carcinoma in situ of right breast: Secondary | ICD-10-CM

## 2019-12-02 DIAGNOSIS — R600 Localized edema: Secondary | ICD-10-CM | POA: Diagnosis not present

## 2019-12-02 DIAGNOSIS — R079 Chest pain, unspecified: Secondary | ICD-10-CM

## 2019-12-02 DIAGNOSIS — I10 Essential (primary) hypertension: Secondary | ICD-10-CM

## 2019-12-02 DIAGNOSIS — M7121 Synovial cyst of popliteal space [Baker], right knee: Secondary | ICD-10-CM

## 2019-12-02 LAB — POCT GLYCOSYLATED HEMOGLOBIN (HGB A1C): Hemoglobin A1C: 7.9 % — AB (ref 4.0–5.6)

## 2019-12-02 MED ORDER — LEVEMIR FLEXTOUCH 100 UNIT/ML ~~LOC~~ SOPN: PEN_INJECTOR | SUBCUTANEOUS | 3 refills | Status: DC

## 2019-12-02 NOTE — Progress Notes (Signed)
[facility] [place],  [address]  Internal MEDICINE  Office Visit Note  Patient Name: Kara [name]  [id number]  [id number]  Date of Service: 12/18/2019  Chief Complaint  Patient presents with  . Follow-up  . Quality Metric Gaps    TDAP  . Diabetes  . Hyperlipidemia  . Hypertension    The patient is here for routine follow up visit. She is having swelling in her lower legs and ankles. She also has a throbbing sensation in left lower leg. The left leg has higher degree of swelling than the right. She does have chronic kidney disease. She had echocardiogram done and bilateral lower extremity doppler study since her last visit. Her echocardiogram shows normal LVEF with mild annular mitral calcification. She has trace mitral and tricuspid regurgitation. There is no pulmonary hypertension present. The ultrasound of her legs indicates presence of Right Baker's cyst with no evidence of DVT.  Her blood sugar's are improved. Her HgbA1c is 7.1 today, down from 9.1 at the last check. She states that she has done well with increased insulin dosing. She does have chronic kidney disease, stage 3B. She sees nephrology.  She is due to have mammogram.       Current Medication: Outpatient Encounter Medications as of 12/02/2019  Medication Sig  . APPLE CIDER VINEGAR PO Take 2 tablets by mouth daily.   Marland Kitchen aspirin EC 81 MG tablet Take 81 mg by mouth daily.   . cholecalciferol (VITAMIN D) 1000 UNITS tablet Take 2,000 Units by mouth daily.   . Dulaglutide (TRULICITY) 1.5 RJ/1.8AC SOPN INJECT 1.5 MG SUBCUTANEOUSLY ONCE WEEKLY.  . Ferrous Gluconate 324 (37.5 Fe) MG TABS Take 324 mg by mouth 2 (two) times daily.   . furosemide (LASIX) 20 MG tablet Take 20 mg by mouth daily.  . insulin aspart (NOVOLOG FLEXPEN) 100 UNIT/ML FlexPen Inject [place] twice daily as directed per sliding scale. Max daily dose is 30 units. Dx E11.65.  Marland Kitchen insulin detemir (LEVEMIR FLEXTOUCH) 100 UNIT/ML  FlexPen Inject up to 75 units [place] daily  . Lactobacillus (PROBIOTIC ACIDOPHILUS) CAPS Take 1 capsule by mouth daily.  Marland Kitchen lisinopril (ZESTRIL) 10 MG tablet Take 10 mg by mouth daily.  . Multiple Vitamin (MULTIVITAMIN) tablet Take 1 tablet by mouth daily at 12 noon.  . pantoprazole (PROTONIX) 40 MG tablet TAKE 1 TABLET(S) BY MOUTH DAILY  . pregabalin (LYRICA) 100 MG capsule Take 1 capsule (100 mg total) by mouth 3 (three) times daily.  . rosuvastatin (CRESTOR) 20 MG tablet TAKE 1 TABLET BY MOUTH EVERYDAY AT BEDTIME  . tamoxifen (NOLVADEX) 20 MG tablet Take 1 tablet (20 mg total) by mouth daily.  . traMADol-acetaminophen (ULTRACET) 37.5-325 MG tablet Take 1 tablet by mouth 2 (two) times daily as needed for moderate pain.  Marland Kitchen triamcinolone (KENALOG) 0.025 % cream Apply 1 application topically 2 (two) times daily.  . verapamil (CALAN) 80 MG tablet Take 1 tablet po bid  . [DISCONTINUED] insulin detemir (LEVEMIR FLEXTOUCH) 100 UNIT/ML FlexPen Inject 70 Units into the skin daily after supper.   No facility-administered encounter medications on file as of 12/02/2019.    Surgical History: Past Surgical History:  Procedure Laterality Date  . BILATERAL SALPINGECTOMY    . BREAST BIOPSY Right 2014   neg- core  . BREAST BIOPSY Right 12/21/2018   [place] bx, venus clip,  DUCTAL CARCINOMA IN SITU  . BREAST BIOPSY Right 12/21/2018   [place] bx, ribbon clip,  FIBROEPITHELIAL PROLIFERATION WITH SCLEROSIS  .  BREAST BIOPSY Right 01/01/2019   Affirm bx "X" clip-path pending  . BREAST DUCTAL SYSTEM EXCISION    . BREAST EXCISIONAL BIOPSY Right 2014   neg  . BREAST LUMPECTOMY Right 01/22/2019   path pending  . CATARACT EXTRACTION    . CHOLECYSTECTOMY    . CYSTOSCOPY WITH STENT PLACEMENT Left 04/14/2015   Procedure: CYSTOSCOPY WITH STENT PLACEMENT;  Surgeon: Hollice Espy, MD;  Location: [facility];  Service: Urology;  Laterality: Left;  . EXTRACORPOREAL SHOCK WAVE LITHOTRIPSY Left 09/29/2014   Procedure: EXTRACORPOREAL  SHOCK WAVE LITHOTRIPSY (ESWL);  Surgeon: Hollice Espy, MD;  Location: [facility];  Service: Urology;  Laterality: Left;  . EYE SURGERY     bilateral cataract  . IRRIGATION AND DEBRIDEMENT ABSCESS Right 03/24/2019   Procedure: IRRIGATION AND DEBRIDEMENT ABSCESS RIGHT BREAST;  Surgeon: Herbert Pun, MD;  Location: [facility];  Service: General;  Laterality: Right;  . LAPAROSCOPIC OOPHERECTOMY Left   . PARTIAL MASTECTOMY WITH NEEDLE LOCALIZATION Right 01/22/2019   Procedure: PARTIAL MASTECTOMY WITH NEEDLE LOCALIZATION;  Surgeon: Herbert Pun, MD;  Location: [facility];  Service: General;  Laterality: Right;  . TONSILLECTOMY    . TUBAL LIGATION    . URETEROSCOPY WITH HOLMIUM LASER LITHOTRIPSY Left 04/14/2015   Procedure: URETEROSCOPY WITH HOLMIUM LASER LITHOTRIPSY;  Surgeon: Hollice Espy, MD;  Location: [facility];  Service: Urology;  Laterality: Left;    Medical History: Past Medical History:  Diagnosis Date  . Anemia   . Arthropathy   . Benign neoplasm of breast   . Chest pain   . Chronic kidney disease    30% kidney function  . Diabetes ([place])   . Diabetic retinopathy ([place])   . Dyspnea   . Edema   . History of kidney stones   . Hyperlipemia   . Hypersomnia with sleep apnea   . Hypertension   . 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 Lumbago   . Osteoarthrosis   . Ovarian failure   . SOB (shortness of breath)     Family History: Family History  Problem Relation Age of Onset  . Breast cancer Maternal Grandmother 58  . Colon cancer Mother   . Lung cancer Father     Social History   Socioeconomic History  . Marital status: Married    Spouse name: Not on file  . Number of children: Not on file  . Years of education: Not on file  . Highest education level: Not on file  Occupational History  . Not on file  Tobacco Use  . Smoking status: Former Smoker    Packs/day: 1.00    Years: 30.00    Pack years: 30.00    Types: Cigarettes    Quit date:  04/11/1995    Years since quitting: 24.7  . Smokeless tobacco: Never Used  Vaping Use  . Vaping Use: Never used  Substance and Sexual Activity  . Alcohol use: No    Alcohol/week: 0.0 standard drinks  . Drug use: No  . Sexual activity: Not on file  Other Topics Concern  . Not on file  Social History Narrative  . Not on file   Social Determinants of Health   Financial Resource Strain:   . Difficulty of Paying Living Expenses: Not on file  Food Insecurity:   . Worried About Charity fundraiser in the Last Year: Not on file  . Ran Out of Food in the Last Year: Not on file  Transportation Needs:   . Lack of  Transportation (Medical): Not on file  . Lack of Transportation (Non-Medical): Not on file  Physical Activity:   . Days of Exercise per Week: Not on file  . Minutes of Exercise per Session: Not on file  Stress:   . Feeling of Stress : Not on file  Social Connections:   . Frequency of Communication with Friends and Family: Not on file  . Frequency of Social Gatherings with Friends and Family: Not on file  . Attends Religious Services: Not on file  . Active Member of Clubs or Organizations: Not on file  . Attends Archivist Meetings: Not on file  . Marital Status: Not on file  Intimate Partner Violence:   . Fear of Current or Ex-Partner: Not on file  . Emotionally Abused: Not on file  . Physically Abused: Not on file  . Sexually Abused: Not on file      Review of Systems  Constitutional: Positive for activity change and fatigue. Negative for chills and unexpected weight change.       Improved fatigue levels.   HENT: Negative for congestion, postnasal drip, rhinorrhea, sneezing and sore throat.   Respiratory: Negative for cough, chest tightness, shortness of breath and wheezing.   Cardiovascular: Positive for chest pain and leg swelling. Negative for palpitations.       Consistently elevated blood pressure.   Gastrointestinal: Negative for abdominal pain,  constipation, diarrhea, nausea and vomiting.  Endocrine: Negative for cold intolerance, heat intolerance, polydipsia and polyuria.       Improved blood sugars since her most recent visit.   Genitourinary: Negative for hematuria.  Musculoskeletal: Positive for myalgias. Negative for arthralgias, back pain, joint swelling and neck pain.       Bilateral lower leg pain.   Skin: Negative for rash.  Allergic/Immunologic: Negative for environmental allergies.  Neurological: Positive for weakness and headaches. Negative for dizziness, tremors and numbness.       Reports an occasional headache since she started taking tamoxifen.   Hematological: Negative for adenopathy. Does not bruise/bleed easily.  Psychiatric/Behavioral: Negative for behavioral problems (Depression), sleep disturbance and suicidal ideas. The patient is nervous/anxious.     Today's Vitals   12/02/19 0926  BP: (!) 169/63  Pulse: 66  Resp: 16  Temp: (!) 97.4 F (36.3 C)  SpO2: 95%  Weight: 213 lb 6.4 oz (96.8 kg)  Height: 5\' 3"  (1.6 m)   Body mass index is 37.8 kg/m.  Physical Exam Vitals and nursing note reviewed.  Constitutional:      General: She is not in acute distress.    Appearance: Normal appearance. She is well-developed. She is obese. She is not diaphoretic.  HENT:     Head: Normocephalic and atraumatic.     Nose: Nose normal.     Mouth/Throat:     Pharynx: No oropharyngeal exudate.  Eyes:     Conjunctiva/sclera: Conjunctivae normal.     Pupils: Pupils are equal, round, and reactive to light.  Neck:     Thyroid: No thyromegaly.     Vascular: No carotid bruit or JVD.     Trachea: No tracheal deviation.  Cardiovascular:     Rate and Rhythm: Normal rate and regular rhythm.     Heart sounds: Normal heart sounds. No murmur heard.  No friction rub. No gallop.      Comments: Mild, improved swelling of both lower legs and ankles.  Pulmonary:     Effort: Pulmonary effort is normal. No respiratory distress.  Breath sounds: Normal breath sounds. No wheezing or rales.  Chest:     Chest wall: No tenderness.  Abdominal:     Palpations: Abdomen is soft.  Musculoskeletal:        General: Normal range of motion.     Cervical back: Normal range of motion and neck supple.  Lymphadenopathy:     Cervical: No cervical adenopathy.  Skin:    General: Skin is warm and dry.  Neurological:     Mental Status: She is alert and oriented to person, place, and time.     Cranial Nerves: No cranial nerve deficit.  Psychiatric:        Mood and Affect: Mood normal.        Behavior: Behavior normal.        Thought Content: Thought content normal.        Judgment: Judgment normal.    Assessment/Plan: 1. Uncontrolled type 2 diabetes mellitus with hyperglycemia (HCC) - POCT HgB A1C 7.9 today, down from 9.1 today. Patient should take up to 75 units daily. Continue mealtime regular insulin coverage as needed and as prescribed.  - insulin detemir (LEVEMIR FLEXTOUCH) 100 UNIT/ML FlexPen; Inject up to 75 units [place] daily  Dispense: 30 mL; Refill: 3  2. Chest pain, unspecified type Reviewed results of patient's echocardiogram. Her echocardiogram shows normal LVEF with mild annular mitral calcification. She has trace mitral and tricuspid regurgitation. There is no pulmonary hypertension present.   3. Essential hypertension Elevated today. Blood pressure medication recently adjusted per nephrology. Will continue to monitor closely.   4. Bilateral lower extremity edema Reviewed ultrasound of bilateral lower extremities. The ultrasound of her legs indicates presence of Right Baker's cyst with no evidence of DVT. Discussed referral to orthopedics to treat Baker's cyst. She declines this service for now. Will monitor closely.   5. Synovial cyst of right popliteal space The ultrasound of her legs indicates presence of Right Baker's cyst with no evidence of DVT. Discussed referral to orthopedics to treat Baker's cyst. She  declines this service for now. Will monitor closely.   6. Ductal carcinoma in situ (DCIS) of right breast - MM DIAG BREAST TOMO BILATERAL; Future  General Counseling: [name] understanding of the findings of todays visit and agrees with plan of treatment. I have discussed any further diagnostic evaluation that may be needed or ordered today. We also reviewed her medications today. she has been encouraged to call the office with any questions or concerns that should arise related to todays visit.  This patient was seen by Leretha Pol FNP Collaboration with Dr Lavera Guise as a part of collaborative care agreement  Orders Placed This Encounter  Procedures  . MM DIAG BREAST TOMO BILATERAL  . POCT HgB A1C    Meds ordered this encounter  Medications  . insulin detemir (LEVEMIR FLEXTOUCH) 100 UNIT/ML FlexPen    Sig: Inject up to 75 units [place] daily    Dispense:  30 mL    Refill:  3    Please ensure patient has full 3 day supply levemir. Thanks.    Order Specific Question:   Supervising Provider    Answer:   Lavera Guise [[id number]]    Total time spent: 30 Minutes   Time spent includes review of chart, medications, test results, and follow up plan with the patient.      Dr Lavera Guise Internal medicine

## 2019-12-18 DIAGNOSIS — M7121 Synovial cyst of popliteal space [Baker], right knee: Secondary | ICD-10-CM | POA: Insufficient documentation

## 2020-01-01 ENCOUNTER — Other Ambulatory Visit: Payer: Self-pay | Admitting: Adult Health

## 2020-01-03 ENCOUNTER — Other Ambulatory Visit: Payer: Self-pay

## 2020-01-03 DIAGNOSIS — I10 Essential (primary) hypertension: Secondary | ICD-10-CM

## 2020-01-03 MED ORDER — VERAPAMIL HCL 80 MG PO TABS: ORAL_TABLET | ORAL | 3 refills | Status: DC

## 2020-01-09 NOTE — Progress Notes (Signed)
[place]  [phone] [phone]  ID: [name] OB: Oct 19, 1945  MR#: [id number]  FXO#:[id number]  Patient Care Team: Lavera Guise, MD as PCP - General (Internal Medicine) Lloyd Huger, MD as Consulting Physician (Oncology) Herbert Pun, MD as Consulting Physician (General Surgery) Noreene Filbert, MD as Referring Physician (Radiation Oncology)  CHIEF COMPLAINT: Low-grade DCIS of the right breast.  INTERVAL HISTORY: Patient returns to clinic today for routine 34-monthevaluation.  She has a mild peripheral edema, but otherwise feels well.  She is tolerating tamoxifen without significant side effects. She has no neurologic complaints.  She denies any recent fevers or illnesses.  She has a good appetite and denies weight loss.  She has no chest pain, shortness of breath, cough, or hemoptysis.  She denies any nausea, vomiting, constipation, or diarrhea.  She has no urinary complaints.  Patient offers no further specific complaints today.  REVIEW OF SYSTEMS:   Review of Systems  Constitutional: Negative.  Negative for fever, malaise/fatigue and weight loss.  Respiratory: Negative.  Negative for cough, hemoptysis and shortness of breath.   Cardiovascular: Positive for leg swelling. Negative for chest pain.  Gastrointestinal: Negative.  Negative for abdominal pain.  Genitourinary: Negative.  Negative for dysuria.  Musculoskeletal: Negative.  Negative for back pain and joint pain.  Skin: Negative.  Negative for rash.  Neurological: Negative.  Negative for dizziness, focal weakness, weakness and headaches.  Psychiatric/Behavioral: Negative.  The patient is not nervous/anxious.     As per HPI. Otherwise, a complete review of systems is negative.  PAST MEDICAL HISTORY: Past Medical History:  Diagnosis Date  . Anemia   . Arthropathy   . Benign neoplasm of breast   . Chest pain   . Chronic kidney disease    30% kidney function   . [place]   . Diabetic retinopathy ([place]   . Dyspnea   . Edema   . History of kidney stones   . Hyperlipemia   . Hypersomnia with sleep apnea   . Hypertension   . 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[place]   . Lumbago   . Osteoarthrosis   . Ovarian failure   . SOB (shortness of breath)     PAST SURGICAL HISTORY: Past Surgical History:  Procedure Laterality Date  . BILATERAL SALPINGECTOMY    . BREAST BIOPSY Right 2014   neg- core  . BREAST BIOPSY Right 12/21/2018   [place], venus clip,  DUCTAL CARCINOMA IN SITU  . BREAST BIOPSY Right 12/21/2018   [place], ribbon clip,  FIBROEPITHELIAL PROLIFERATION WITH SCLEROSIS  . BREAST BIOPSY Right 01/01/2019   Affirm bx "X" clip-path pending  . BREAST DUCTAL SYSTEM EXCISION    . BREAST EXCISIONAL BIOPSY Right 2014   neg  . BREAST LUMPECTOMY Right 01/22/2019   path pending  . CATARACT EXTRACTION    . CHOLECYSTECTOMY    . CYSTOSCOPY WITH STENT PLACEMENT Left 04/14/2015   Procedure: CYSTOSCOPY WITH STENT PLACEMENT;  Surgeon: AHollice Espy MD;  Location: [facility];  Service: Urology;  Laterality: Left;  . EXTRACORPOREAL SHOCK WAVE LITHOTRIPSY Left 09/29/2014   Procedure: EXTRACORPOREAL SHOCK WAVE LITHOTRIPSY (ESWL);  Surgeon: AHollice Espy MD;  Location: [facility];  Service: Urology;  Laterality: Left;  . EYE SURGERY     bilateral cataract  . IRRIGATION AND DEBRIDEMENT ABSCESS Right 03/24/2019   Procedure: IRRIGATION AND DEBRIDEMENT ABSCESS RIGHT BREAST;  Surgeon: CHerbert Pun MD;  Location: [facility];  Service: General;  Laterality: Right;  .  LAPAROSCOPIC OOPHERECTOMY Left   . PARTIAL MASTECTOMY WITH NEEDLE LOCALIZATION Right 01/22/2019   Procedure: PARTIAL MASTECTOMY WITH NEEDLE LOCALIZATION;  Surgeon: Herbert Pun, MD;  Location: [facility];  Service: General;  Laterality: Right;  . TONSILLECTOMY    . TUBAL LIGATION    . URETEROSCOPY WITH HOLMIUM LASER LITHOTRIPSY Left 04/14/2015   Procedure: URETEROSCOPY WITH  HOLMIUM LASER LITHOTRIPSY;  Surgeon: Hollice Espy, MD;  Location: [facility];  Service: Urology;  Laterality: Left;    FAMILY HISTORY: Family History  Problem Relation Age of Onset  . Breast cancer Maternal Grandmother 48  . Colon cancer Mother   . Lung cancer Father     ADVANCED DIRECTIVES (Y/N):  N  HEALTH MAINTENANCE: Social History   Tobacco Use  . Smoking status: Former Smoker    Packs/day: 1.00    Years: 30.00    Pack years: 30.00    Types: Cigarettes    Quit date: 04/11/1995    Years since quitting: 24.7  . Smokeless tobacco: Never Used  Vaping Use  . Vaping Use: Never used  Substance Use Topics  . Alcohol use: No    Alcohol/week: 0.0 standard drinks  . Drug use: No     Colonoscopy:  PAP:  Bone density:  Lipid panel:  No Known Allergies  Current Outpatient Medications  Medication Sig Dispense Refill  . APPLE CIDER VINEGAR PO Take 2 tablets by mouth daily.     Marland Kitchen aspirin EC 81 MG tablet Take 81 mg by mouth daily.     . cholecalciferol (VITAMIN D) 1000 UNITS tablet Take 2,000 Units by mouth daily.     . Dulaglutide (TRULICITY) 1.5 QH/4.7ML SOPN INJECT 1.5 MG SUBCUTANEOUSLY ONCE WEEKLY. 2 pen 1  . Ferrous Gluconate 324 (37.5 Fe) MG TABS Take 324 mg by mouth 2 (two) times daily.     . furosemide (LASIX) 20 MG tablet Take 20 mg by mouth daily.    . insulin aspart (NOVOLOG FLEXPEN) 100 UNIT/ML FlexPen Inject [place] twice daily as directed per sliding scale. Max daily dose is 30 units. Dx E11.65. 15 mL 3  . insulin detemir (LEVEMIR FLEXTOUCH) 100 UNIT/ML FlexPen Inject up to 75 units [place] daily 30 mL 3  . Lactobacillus (PROBIOTIC ACIDOPHILUS) CAPS Take 1 capsule by mouth daily.    Marland Kitchen lisinopril (ZESTRIL) 10 MG tablet Take 10 mg by mouth daily.    . Multiple Vitamin (MULTIVITAMIN) tablet Take 1 tablet by mouth daily at 12 noon.    . pantoprazole (PROTONIX) 40 MG tablet TAKE 1 TABLET(S) BY MOUTH DAILY 90 tablet 1  . pregabalin (LYRICA) 100 MG capsule Take 1 capsule (100 mg  total) by mouth 3 (three) times daily. 90 capsule 3  . rosuvastatin (CRESTOR) 20 MG tablet TAKE 1 TABLET BY MOUTH EVERYDAY AT BEDTIME 90 tablet 3  . tamoxifen (NOLVADEX) 20 MG tablet Take 1 tablet (20 mg total) by mouth daily. 90 tablet 3  . traMADol-acetaminophen (ULTRACET) 37.5-325 MG tablet Take 1 tablet by mouth 2 (two) times daily as needed for moderate pain. 60 tablet 2  . triamcinolone (KENALOG) 0.025 % cream Apply 1 application topically 2 (two) times daily. 80 g 2  . verapamil (CALAN) 80 MG tablet Take 1 tablet po bid 60 tablet 3   No current facility-administered medications for this visit.    OBJECTIVE: Vitals:   01/13/20 1023  BP: (!) 166/72  Pulse: 63  Resp: 20  Temp: (!) 97 F (36.1 C)  SpO2: 98%  Body mass index is 38.16 kg/m.    ECOG FS:0 - Asymptomatic  General: Well-developed, well-nourished, no acute distress. Eyes: Pink conjunctiva, anicteric sclera. HEENT: Normocephalic, moist mucous membranes. Breasts: Patient declined exam today. Lungs: No audible wheezing or coughing. Heart: Regular rate and rhythm. Abdomen: Soft, nontender, no obvious distention. Musculoskeletal: Scant edema. Neuro: Alert, answering all questions appropriately. Cranial nerves grossly intact. Skin: No rashes or petechiae noted. Psych: Normal affect.    LAB RESULTS:  Lab Results  Component Value Date   NA 138 08/16/2019   K 4.2 08/16/2019   CL 97 08/16/2019   CO2 27 08/16/2019   GLUCOSE 180 (H) 08/16/2019   BUN 25 08/16/2019   CREATININE 1.51 (H) 08/16/2019   CALCIUM 9.3 08/16/2019   PROT 7.3 08/16/2019   ALBUMIN 4.0 08/16/2019   AST 27 08/16/2019   ALT 25 08/16/2019   ALKPHOS 90 08/16/2019   BILITOT 0.3 08/16/2019   GFRNONAA 34 (L) 08/16/2019   GFRAA 39 (L) 08/16/2019    Lab Results  Component Value Date   WBC 5.5 08/16/2019   NEUTROABS 14.2 (H) 03/21/2019   HGB 11.9 08/16/2019   HCT 36.3 08/16/2019   MCV 85 08/16/2019   PLT 155 08/16/2019      STUDIES: No results found.  ASSESSMENT: Low-grade DCIS of the right breast.  PLAN:    1. Low-grade DCIS of the right breast: Patient declined enrollment in COMET trial.  She underwent lumpectomy on January 22, 2019.  Final pathology noted close, but clear margins.  Patient initiated XRT, but has discontinued after approximately 3 weeks. She was extremely upset how she was treated by the radiation oncology provider and refused to return or continue XRT.  We previously discussed the possibility of transferring care to another facility which she also declined.  She acknowledges that although she has low-grade DCIS, this increases her risk slightly of recurrence.  No further interventions are needed.  Continue tamoxifen for total of 5 years completing treatment in December 2025.  Patient has repeat mammogram scheduled for October 2021.  Return to clinic in 6 months with video assisted telemedicine visit. 2.  Breast abscess: Resolved.  Continue follow-up with surgery as scheduled. 3.  Joint pain: Patient does not complain of this today.  Patient states she has tramadol which she takes periodically. 4.  Peripheral edema: Mild.  Recommended elevation and compression stockings if needed.   Patient expressed understanding and was in agreement with this plan. She also understands that She can call clinic at any time with any questions, concerns, or complaints.   Cancer Staging Ductal carcinoma in situ (DCIS) of right breast Staging form: Breast, AJCC 8th Edition - Clinical stage from 01/01/2019: Stage 0 (cTis (DCIS), cN0, cM0, G1, ER+, PR: Not Assessed, HER2: Not Assessed) - Signed by Lloyd Huger, MD on 01/01/2019   Lloyd Huger, MD   01/14/2020 10:29 AM

## 2020-01-12 ENCOUNTER — Encounter: Payer: Self-pay | Admitting: Oncology

## 2020-01-12 NOTE — Progress Notes (Signed)
Patient called for pre assessment. She states has noticed itchy skin mainly on hands and swelling in ankles since starting tamoxifen last year. States she feels like she is wearing a concrete boot and would like to discuss with provider. Also reports some occasional lower back pain and rates pain at 8 when she has it.

## 2020-01-13 ENCOUNTER — Encounter: Payer: Self-pay | Admitting: Oncology

## 2020-01-13 ENCOUNTER — Inpatient Hospital Stay: Payer: Medicare HMO | Attending: Oncology | Admitting: Oncology

## 2020-01-13 ENCOUNTER — Other Ambulatory Visit: Payer: Self-pay

## 2020-01-13 VITALS — BP 166/72 | HR 63 | Temp 97.0°F | Resp 20 | Ht 63.0 in | Wt 215.4 lb

## 2020-01-13 DIAGNOSIS — E1122 Type 2 diabetes mellitus with diabetic chronic kidney disease: Secondary | ICD-10-CM | POA: Diagnosis not present

## 2020-01-13 DIAGNOSIS — N611 Abscess of the breast and nipple: Secondary | ICD-10-CM | POA: Insufficient documentation

## 2020-01-13 DIAGNOSIS — Z9079 Acquired absence of other genital organ(s): Secondary | ICD-10-CM | POA: Diagnosis not present

## 2020-01-13 DIAGNOSIS — D0511 Intraductal carcinoma in situ of right breast: Secondary | ICD-10-CM | POA: Diagnosis not present

## 2020-01-13 DIAGNOSIS — Z87442 Personal history of urinary calculi: Secondary | ICD-10-CM | POA: Diagnosis not present

## 2020-01-13 DIAGNOSIS — Z9049 Acquired absence of other specified parts of digestive tract: Secondary | ICD-10-CM | POA: Diagnosis not present

## 2020-01-13 DIAGNOSIS — Z801 Family history of malignant neoplasm of trachea, bronchus and lung: Secondary | ICD-10-CM | POA: Insufficient documentation

## 2020-01-13 DIAGNOSIS — R600 Localized edema: Secondary | ICD-10-CM | POA: Insufficient documentation

## 2020-01-13 DIAGNOSIS — Z800 Family history of malignant neoplasm of digestive organs: Secondary | ICD-10-CM | POA: Insufficient documentation

## 2020-01-13 DIAGNOSIS — Z79899 Other long term (current) drug therapy: Secondary | ICD-10-CM | POA: Diagnosis not present

## 2020-01-13 DIAGNOSIS — M7989 Other specified soft tissue disorders: Secondary | ICD-10-CM | POA: Insufficient documentation

## 2020-01-13 DIAGNOSIS — N184 Chronic kidney disease, stage 4 (severe): Secondary | ICD-10-CM | POA: Insufficient documentation

## 2020-01-13 DIAGNOSIS — Z803 Family history of malignant neoplasm of breast: Secondary | ICD-10-CM | POA: Insufficient documentation

## 2020-01-13 DIAGNOSIS — Z794 Long term (current) use of insulin: Secondary | ICD-10-CM | POA: Insufficient documentation

## 2020-01-13 DIAGNOSIS — Z86018 Personal history of other benign neoplasm: Secondary | ICD-10-CM | POA: Insufficient documentation

## 2020-01-13 DIAGNOSIS — I129 Hypertensive chronic kidney disease with stage 1 through stage 4 chronic kidney disease, or unspecified chronic kidney disease: Secondary | ICD-10-CM | POA: Diagnosis not present

## 2020-01-13 DIAGNOSIS — M2550 Pain in unspecified joint: Secondary | ICD-10-CM | POA: Insufficient documentation

## 2020-01-13 DIAGNOSIS — Z87891 Personal history of nicotine dependence: Secondary | ICD-10-CM | POA: Insufficient documentation

## 2020-01-13 MED ORDER — TAMOXIFEN CITRATE 20 MG PO TABS: 20.0000 mg | ORAL_TABLET | Freq: Every day | ORAL | 3 refills | Status: DC

## 2020-01-21 ENCOUNTER — Other Ambulatory Visit: Payer: Self-pay

## 2020-01-21 MED ORDER — TRULICITY 1.5 MG/0.5ML ~~LOC~~ SOAJ: SUBCUTANEOUS | 3 refills | Status: AC

## 2020-01-24 ENCOUNTER — Other Ambulatory Visit: Payer: Self-pay

## 2020-01-24 MED ORDER — PANTOPRAZOLE SODIUM 40 MG PO TBEC: DELAYED_RELEASE_TABLET | ORAL | 1 refills | Status: DC

## 2020-01-27 ENCOUNTER — Other Ambulatory Visit: Payer: Self-pay

## 2020-01-27 ENCOUNTER — Ambulatory Visit
Admission: RE | Admit: 2020-01-27 | Discharge: 2020-01-27 | Disposition: A | Discharge status: Home / Self Care | Payer: Medicare HMO | Source: Ambulatory Visit | Attending: Nurse Practitioner | Admitting: Nurse Practitioner

## 2020-01-27 DIAGNOSIS — D0511 Intraductal carcinoma in situ of right breast: Secondary | ICD-10-CM

## 2020-01-27 DIAGNOSIS — R928 Other abnormal and inconclusive findings on diagnostic imaging of breast: Secondary | ICD-10-CM | POA: Diagnosis not present

## 2020-01-27 DIAGNOSIS — Z853 Personal history of malignant neoplasm of breast: Secondary | ICD-10-CM | POA: Diagnosis not present

## 2020-01-27 HISTORY — DX: Personal history of irradiation: Z92.3

## 2020-01-27 HISTORY — DX: Malignant neoplasm of unspecified site of unspecified female breast: C50.919

## 2020-01-30 NOTE — Progress Notes (Signed)
Bening mammogram

## 2020-02-03 DIAGNOSIS — Z853 Personal history of malignant neoplasm of breast: Secondary | ICD-10-CM | POA: Diagnosis not present

## 2020-02-15 DIAGNOSIS — H34832 Tributary (branch) retinal vein occlusion, left eye, with macular edema: Secondary | ICD-10-CM | POA: Diagnosis not present

## 2020-02-24 ENCOUNTER — Other Ambulatory Visit: Payer: Self-pay

## 2020-02-24 DIAGNOSIS — E1165 Type 2 diabetes mellitus with hyperglycemia: Secondary | ICD-10-CM

## 2020-02-24 MED ORDER — LEVEMIR FLEXTOUCH 100 UNIT/ML ~~LOC~~ SOPN: PEN_INJECTOR | SUBCUTANEOUS | 3 refills | Status: DC

## 2020-03-06 ENCOUNTER — Telehealth: Payer: Self-pay

## 2020-03-06 ENCOUNTER — Ambulatory Visit (INDEPENDENT_AMBULATORY_CARE_PROVIDER_SITE_OTHER): Payer: Medicare HMO | Admitting: Internal Medicine

## 2020-03-06 ENCOUNTER — Encounter: Payer: Self-pay | Admitting: Internal Medicine

## 2020-03-06 ENCOUNTER — Other Ambulatory Visit: Payer: Self-pay

## 2020-03-06 DIAGNOSIS — I10 Essential (primary) hypertension: Secondary | ICD-10-CM

## 2020-03-06 DIAGNOSIS — E1165 Type 2 diabetes mellitus with hyperglycemia: Secondary | ICD-10-CM | POA: Diagnosis not present

## 2020-03-06 DIAGNOSIS — Z1211 Encounter for screening for malignant neoplasm of colon: Secondary | ICD-10-CM

## 2020-03-06 DIAGNOSIS — D0511 Intraductal carcinoma in situ of right breast: Secondary | ICD-10-CM

## 2020-03-06 DIAGNOSIS — M159 Polyosteoarthritis, unspecified: Secondary | ICD-10-CM

## 2020-03-06 DIAGNOSIS — E782 Mixed hyperlipidemia: Secondary | ICD-10-CM

## 2020-03-06 LAB — POCT GLYCOSYLATED HEMOGLOBIN (HGB A1C): Hemoglobin A1C: 7.4 % — AB (ref 4.0–5.6)

## 2020-03-06 MED ORDER — TRAMADOL-ACETAMINOPHEN 37.5-325 MG PO TABS: 1.0000 | ORAL_TABLET | Freq: Two times a day (BID) | ORAL | 2 refills | Status: DC | PRN

## 2020-03-06 NOTE — Progress Notes (Addendum)
[facility] [place], [place] [address]  Internal MEDICINE  Office Visit Note  Patient Name: Kara [name]  [id number]  [id number]  Date of Service: 03/06/2020  Chief Complaint  Patient presents with  . Follow-up  . Diabetes  . Hyperlipidemia  . Hypertension  . controlled substance policy    reviewed    HPI  Pt is here for routine follow up 1. Diabetes is under better control, she was started on mealtime coverage, feels well hg aic is better  2. Has not had colonoscopy but will like to get Cologuard. 3. Has breast cancer on Tamoxifen, recent right breast lumpectomy  4. HLD treated with crestor, 5. Blood pressure is under good control  6. Neuropathy and OA under good control with prescribed meds   Current Medication: Outpatient Encounter Medications as of 03/06/2020  Medication Sig  . APPLE CIDER VINEGAR PO Take 2 tablets by mouth daily.   Marland Kitchen aspirin EC 81 MG tablet Take 81 mg by mouth daily.   . cholecalciferol (VITAMIN D) 1000 UNITS tablet Take 2,000 Units by mouth daily.   . Ferrous Gluconate 324 (37.5 Fe) MG TABS Take 324 mg by mouth 2 (two) times daily.   . furosemide (LASIX) 20 MG tablet Take 20 mg by mouth daily.  . insulin aspart (NOVOLOG FLEXPEN) 100 UNIT/ML FlexPen Inject [place] twice daily as directed per sliding scale. Max daily dose is 30 units. Dx E11.65.  Marland Kitchen insulin detemir (LEVEMIR FLEXTOUCH) 100 UNIT/ML FlexPen Inject up to 75 units [place] daily  . Lactobacillus (PROBIOTIC ACIDOPHILUS) CAPS Take 1 capsule by mouth daily.  Marland Kitchen lisinopril (ZESTRIL) 10 MG tablet Take 10 mg by mouth daily.  . Multiple Vitamin (MULTIVITAMIN) tablet Take 1 tablet by mouth daily at 12 noon.  . pantoprazole (PROTONIX) 40 MG tablet TAKE 1 TABLET(S) BY MOUTH DAILY  . pregabalin (LYRICA) 100 MG capsule Take 1 capsule (100 mg total) by mouth 3 (three) times daily.  . rosuvastatin (CRESTOR) 20 MG tablet TAKE 1 TABLET BY MOUTH EVERYDAY AT BEDTIME  . tamoxifen (NOLVADEX)  20 MG tablet Take 1 tablet (20 mg total) by mouth daily.  . traMADol-acetaminophen (ULTRACET) 37.5-325 MG tablet Take 1 tablet by mouth 2 (two) times daily as needed for moderate pain.  Marland Kitchen triamcinolone (KENALOG) 0.025 % cream Apply 1 application topically 2 (two) times daily.  . verapamil (CALAN) 80 MG tablet Take 1 tablet po bid  . [DISCONTINUED] traMADol-acetaminophen (ULTRACET) 37.5-325 MG tablet Take 1 tablet by mouth 2 (two) times daily as needed for moderate pain.   No facility-administered encounter medications on file as of 03/06/2020.    Surgical History: Past Surgical History:  Procedure Laterality Date  . BILATERAL SALPINGECTOMY    . BREAST BIOPSY Right 2014   neg- core  . BREAST BIOPSY Right 12/21/2018   [place] bx, venus clip,  DUCTAL CARCINOMA IN SITU  . BREAST BIOPSY Right 12/21/2018   [place] bx, ribbon clip,  FIBROEPITHELIAL PROLIFERATION WITH SCLEROSIS  . BREAST BIOPSY Right 01/01/2019   Affirm bx "X" clip-path pending  . BREAST DUCTAL SYSTEM EXCISION    . BREAST EXCISIONAL BIOPSY Right 2014   neg  . BREAST LUMPECTOMY Right 01/22/2019   path pending  . CATARACT EXTRACTION    . CHOLECYSTECTOMY    . CYSTOSCOPY WITH STENT PLACEMENT Left 04/14/2015   Procedure: CYSTOSCOPY WITH STENT PLACEMENT;  Surgeon: Hollice Espy, MD;  Location: [facility] ORS;  Service: Urology;  Laterality: Left;  . EXTRACORPOREAL SHOCK WAVE LITHOTRIPSY  Left 09/29/2014   Procedure: EXTRACORPOREAL SHOCK WAVE LITHOTRIPSY (ESWL);  Surgeon: Hollice Espy, MD;  Location: [facility] OR[facility];  Service: Urology;  Laterality: Left;  . EYE SURGERY     bilateral cataract  . IRRIGATION AND DEBRIDEMENT ABSCESS Right 03/24/2019   Procedure: IRRIGATION AND DEBRIDEMENT ABSCESS RIGHT BREAST;  Surgeon: Herbert Pun, MD;  Location: [facility] OR[facility];  Service: General;  Laterality: Right;  . LAPAROSCOPIC OOPHERECTOMY Left   . PARTIAL MASTECTOMY WITH NEEDLE LOCALIZATION Right 01/22/2019   Procedure: PARTIAL MASTECTOMY WITH NEEDLE  LOCALIZATION;  Surgeon: Herbert Pun, MD;  Location: [facility] OR[facility];  Service: General;  Laterality: Right;  . TONSILLECTOMY    . TUBAL LIGATION    . URETEROSCOPY WITH HOLMIUM LASER LITHOTRIPSY Left 04/14/2015   Procedure: URETEROSCOPY WITH HOLMIUM LASER LITHOTRIPSY;  Surgeon: Hollice Espy, MD;  Location: [facility];  Service: Urology;  Laterality: Left;    Medical History: Past Medical History:  Diagnosis Date  . Anemia   . Arthropathy   . Benign neoplasm of breast   . Breast cancer ([place])   . Chest pain   . Chronic kidney disease    30% kidney function  . Diabetes ([place])   . Diabetic retinopathy ([place])   . Dyspnea   . Edema   . History of kidney stones   . Hyperlipemia   . Hypersomnia with sleep apnea   . Hypertension   . 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 Lumbago   . Osteoarthrosis   . Ovarian failure   . Personal history of radiation therapy   . SOB (shortness of breath)     Family History: Family History  Problem Relation Age of Onset  . Breast cancer Maternal Grandmother 51  . Colon cancer Mother   . Lung cancer Father     Social History   Socioeconomic History  . Marital status: Married    Spouse name: Not on file  . Number of children: Not on file  . Years of education: Not on file  . Highest education level: Not on file  Occupational History  . Not on file  Tobacco Use  . Smoking status: Former Smoker    Packs/day: 1.00    Years: 30.00    Pack years: 30.00    Types: Cigarettes    Quit date: 04/11/1995    Years since quitting: 24.9  . Smokeless tobacco: Never Used  Vaping Use  . Vaping Use: Never used  Substance and Sexual Activity  . Alcohol use: No    Alcohol/week: 0.0 standard drinks  . Drug use: No  . Sexual activity: Not on file  Other Topics Concern  . Not on file  Social History Narrative  . Not on file   Social Determinants of Health   Financial Resource Strain:   . Difficulty of Paying Living Expenses: Not on file   Food Insecurity:   . Worried About Charity fundraiser in the Last Year: Not on file  . Ran Out of Food in the Last Year: Not on file  Transportation Needs:   . Lack of Transportation (Medical): Not on file  . Lack of Transportation (Non-Medical): Not on file  Physical Activity:   . Days of Exercise per Week: Not on file  . Minutes of Exercise per Session: Not on file  Stress:   . Feeling of Stress : Not on file  Social Connections:   . Frequency of Communication with Friends and Family: Not on file  . Frequency of Social Gatherings  with Friends and Family: Not on file  . Attends Religious Services: Not on file  . Active Member of Clubs or Organizations: Not on file  . Attends Archivist Meetings: Not on file  . Marital Status: Not on file  Intimate Partner Violence:   . Fear of Current or Ex-Partner: Not on file  . Emotionally Abused: Not on file  . Physically Abused: Not on file  . Sexually Abused: Not on file      Review of Systems  Constitutional: Negative for chills, diaphoresis and fatigue.  HENT: Negative for ear pain, postnasal drip and sinus pressure.   Eyes: Negative for photophobia, discharge, redness, itching and visual disturbance.  Respiratory: Negative for cough, shortness of breath and wheezing.   Cardiovascular: Negative for chest pain, palpitations and leg swelling.  Gastrointestinal: Negative for abdominal pain, constipation, diarrhea, nausea and vomiting.  Genitourinary: Negative for dysuria and flank pain.  Musculoskeletal: Negative for arthralgias, back pain, gait problem and neck pain.  Skin: Positive for wound. Negative for color change.       After repeated injection for insulin therapy   Allergic/Immunologic: Negative for environmental allergies and food allergies.  Neurological: Negative for dizziness and headaches.  Hematological: Does not bruise/bleed easily.  Psychiatric/Behavioral: Negative for agitation, behavioral problems  (depression) and hallucinations.    Vital Signs: BP 132/80   Pulse 66   Temp 97.8 F (36.6 C)   Resp 16   Ht 5\' 4"  (1.626 m)   Wt 212 lb (96.2 kg)   SpO2 99%   BMI 36.39 kg/m    Physical Exam Constitutional:      General: She is not in acute distress.    Appearance: She is well-developed. She is not diaphoretic.  HENT:     Head: Normocephalic and atraumatic.     Mouth/Throat:     Pharynx: No oropharyngeal exudate.  Eyes:     Pupils: Pupils are equal, round, and reactive to light.  Neck:     Thyroid: No thyromegaly.     Vascular: No JVD.     Trachea: No tracheal deviation.  Cardiovascular:     Rate and Rhythm: Normal rate and regular rhythm.     Heart sounds: Normal heart sounds. No murmur heard.  No friction rub. No gallop.   Pulmonary:     Effort: Pulmonary effort is normal. No respiratory distress.     Breath sounds: No wheezing or rales.  Chest:     Chest wall: No tenderness.  Abdominal:     General: Bowel sounds are normal.     Palpations: Abdomen is soft.  Musculoskeletal:        General: Normal range of motion.     Cervical back: Normal range of motion and neck supple.     Right lower leg: Edema present.     Left lower leg: Edema present.  Lymphadenopathy:     Cervical: No cervical adenopathy.  Skin:    General: Skin is warm and dry.  Neurological:     Mental Status: She is alert and oriented to person, place, and time.     Cranial Nerves: No cranial nerve deficit.  Psychiatric:        Behavior: Behavior normal.        Thought Content: Thought content normal.        Judgment: Judgment normal.        Assessment/Plan: 1. Uncontrolled type 2 diabetes mellitus with hyperglycemia (HCC) Improved, will continue Novolog flex pen sliding scale  and Levemir  - POCT HgB A1C 7.4 down from 9.1  2. Ductal carcinoma in situ (DCIS) of right breast Pt is on Tamoxifen per oncology   3. Generalized osteoarthritis of multiple sites Continue Lyrica and  ultarcet - traMADol-acetaminophen (ULTRACET) 37.5-325 MG tablet; Take 1 tablet by mouth 2 (two) times daily as needed for moderate pain.  Dispense: 60 tablet; Refill: 2  4. Screening for colon cancer Cologuard as pt does not want to have Colonoscopy   5. Benign hypertension Controlled on Lisinopril and Calan   6. Mixed hyperlipidemia controlled   General Counseling: Rosanna verbalizes understanding of the findings of todays visit and agrees with plan of treatment. I have discussed any further diagnostic evaluation that may be needed or ordered today. We also reviewed her medications today. she has been encouraged to call the office with any questions or concerns that should arise related to todays visit.  A note for Covid vaccine vaccine is given since pt is immunocompromised  Orders Placed This Encounter  Procedures  . POCT HgB A1C    Meds ordered this encounter  Medications  . traMADol-acetaminophen (ULTRACET) 37.5-325 MG tablet    Sig: Take 1 tablet by mouth 2 (two) times daily as needed for moderate pain.    Dispense:  60 tablet    Refill:  2    Total time spent: 35Minutes Time spent includes review of chart, medications, test results, and follow up plan with the patient.      Dr Lavera Guise Internal medicine

## 2020-03-06 NOTE — Telephone Encounter (Signed)
Faxed cologuard 

## 2020-03-13 ENCOUNTER — Other Ambulatory Visit: Payer: Self-pay

## 2020-03-21 DIAGNOSIS — Z1211 Encounter for screening for malignant neoplasm of colon: Secondary | ICD-10-CM | POA: Diagnosis not present

## 2020-03-21 DIAGNOSIS — Z1212 Encounter for screening for malignant neoplasm of rectum: Secondary | ICD-10-CM | POA: Diagnosis not present

## 2020-03-27 DIAGNOSIS — R8290 Unspecified abnormal findings in urine: Secondary | ICD-10-CM | POA: Diagnosis not present

## 2020-03-27 DIAGNOSIS — N1832 Chronic kidney disease, stage 3b: Secondary | ICD-10-CM | POA: Diagnosis not present

## 2020-03-30 LAB — COLOGUARD: COLOGUARD: NEGATIVE

## 2020-03-30 LAB — EXTERNAL GENERIC LAB PROCEDURE: COLOGUARD: NEGATIVE

## 2020-04-04 DIAGNOSIS — N184 Chronic kidney disease, stage 4 (severe): Secondary | ICD-10-CM | POA: Diagnosis not present

## 2020-04-04 DIAGNOSIS — R600 Localized edema: Secondary | ICD-10-CM | POA: Diagnosis not present

## 2020-04-04 DIAGNOSIS — N2581 Secondary hyperparathyroidism of renal origin: Secondary | ICD-10-CM | POA: Diagnosis not present

## 2020-04-04 DIAGNOSIS — I10 Essential (primary) hypertension: Secondary | ICD-10-CM | POA: Diagnosis not present

## 2020-04-04 DIAGNOSIS — E1122 Type 2 diabetes mellitus with diabetic chronic kidney disease: Secondary | ICD-10-CM | POA: Diagnosis not present

## 2020-04-05 ENCOUNTER — Telehealth: Payer: Self-pay

## 2020-04-05 NOTE — Telephone Encounter (Signed)
Spoke with pt and informed her that her cologuard results came back negative.

## 2020-04-06 LAB — COLOGUARD

## 2020-04-20 ENCOUNTER — Ambulatory Visit (INDEPENDENT_AMBULATORY_CARE_PROVIDER_SITE_OTHER): Payer: Medicare HMO | Admitting: Hospice and Palliative Medicine

## 2020-04-20 ENCOUNTER — Encounter: Payer: Self-pay | Admitting: Hospice and Palliative Medicine

## 2020-04-20 VITALS — BP 150/70 | HR 75 | Temp 97.5°F | Resp 16 | Ht 64.0 in | Wt 213.6 lb

## 2020-04-20 DIAGNOSIS — L209 Atopic dermatitis, unspecified: Secondary | ICD-10-CM

## 2020-04-20 DIAGNOSIS — I10 Essential (primary) hypertension: Secondary | ICD-10-CM

## 2020-04-20 MED ORDER — PREDNISONE 10 MG PO TABS: ORAL_TABLET | ORAL | 0 refills | Status: DC

## 2020-04-20 NOTE — Progress Notes (Signed)
[facility] [place], [place] [address]  Internal MEDICINE  Office Visit Note  Patient Name: Kara [name]  [id number]  [id number]  Date of Service: 04/27/2020  Chief Complaint  Patient presents with  . Acute Visit  . Rash    Started Sunday, on upper thighs, and on arms, itchy, might be on back as well, red, started with a burning sensation, has gotten slightly better  . Diabetes  . Hyperlipidemia  . Hypertension  . Anemia     HPI Pt is here for a sick visit. C/o rash on legs, abdomen and forearms First noticed rash on Sunday, has improved since then but continues to itch and be inflamed The itching is worse at night and has been disrupting her sleep Unsure as to why the rash occurred, has not changed her soap or laundry detergent No recent changes in her medications or recent travel  Discussed elevated BP today--usually well controlled, has been taking her medications, feels it may be due to lack of good sleep due to rash  Current Medication:  Outpatient Encounter Medications as of 04/20/2020  Medication Sig  . predniSONE (DELTASONE) 10 MG tablet Take 1 tablet three times a day with a meal for three for three days, take 1 tablet by twice daily with a meal for 3 days, take 1 tablet once daily with a meal for 3 days  . APPLE CIDER VINEGAR PO Take 2 tablets by mouth daily.   Marland Kitchen aspirin EC 81 MG tablet Take 81 mg by mouth daily.   . cholecalciferol (VITAMIN D) 1000 UNITS tablet Take 2,000 Units by mouth daily.   . Ferrous Gluconate 324 (37.5 Fe) MG TABS Take 324 mg by mouth 2 (two) times daily.   . furosemide (LASIX) 20 MG tablet Take 20 mg by mouth daily.  . insulin aspart (NOVOLOG FLEXPEN) 100 UNIT/ML FlexPen Inject [place] twice daily as directed per sliding scale. Max daily dose is 30 units. Dx E11.65.  Marland Kitchen insulin detemir (LEVEMIR FLEXTOUCH) 100 UNIT/ML FlexPen Inject up to 75 units [place] daily  . Lactobacillus (PROBIOTIC ACIDOPHILUS) CAPS Take 1 capsule  by mouth daily.  Marland Kitchen lisinopril (ZESTRIL) 10 MG tablet Take 10 mg by mouth daily.  . Multiple Vitamin (MULTIVITAMIN) tablet Take 1 tablet by mouth daily at 12 noon.  . pantoprazole (PROTONIX) 40 MG tablet TAKE 1 TABLET(S) BY MOUTH DAILY  . pregabalin (LYRICA) 100 MG capsule Take 1 capsule (100 mg total) by mouth 3 (three) times daily.  . tamoxifen (NOLVADEX) 20 MG tablet Take 1 tablet (20 mg total) by mouth daily.  . traMADol-acetaminophen (ULTRACET) 37.5-325 MG tablet Take 1 tablet by mouth 2 (two) times daily as needed for moderate pain.  Marland Kitchen triamcinolone (KENALOG) 0.025 % cream Apply 1 application topically 2 (two) times daily.  . verapamil (CALAN) 80 MG tablet Take 1 tablet po bid  . [DISCONTINUED] rosuvastatin (CRESTOR) 20 MG tablet TAKE 1 TABLET BY MOUTH EVERYDAY AT BEDTIME   No facility-administered encounter medications on file as of 04/20/2020.      Medical History: Past Medical History:  Diagnosis Date  . Anemia   . Arthropathy   . Benign neoplasm of breast   . Breast cancer ([place])   . Chest pain   . Chronic kidney disease    30% kidney function  . Diabetes ([place])   . Diabetic retinopathy ([place])   . Dyspnea   . Edema   . History of kidney stones   . Hyperlipemia   .  Hypersomnia with sleep apnea   . Hypertension   . 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 Lumbago   . Osteoarthrosis   . Ovarian failure   . Personal history of radiation therapy   . SOB (shortness of breath)      Vital Signs: BP (!) 150/70   Pulse 75   Temp (!) 97.5 F (36.4 C)   Resp 16   Ht 5\' 4"  (1.626 m)   Wt 213 lb 9.6 oz (96.9 kg)   SpO2 97%   BMI 36.66 kg/m    Review of Systems  Constitutional: Negative for chills, diaphoresis and fatigue.  HENT: Negative for ear pain, postnasal drip and sinus pressure.   Eyes: Negative for photophobia, discharge, redness, itching and visual disturbance.  Respiratory: Negative for cough, shortness of breath and wheezing.   Cardiovascular:  Negative for chest pain, palpitations and leg swelling.  Gastrointestinal: Negative for abdominal pain, constipation, diarrhea, nausea and vomiting.  Genitourinary: Negative for dysuria and flank pain.  Musculoskeletal: Negative for arthralgias, back pain, gait problem and neck pain.  Skin: Positive for rash. Negative for color change.       Rash to lower arms, thighs and abdomen, reports itching and irritation  Allergic/Immunologic: Negative for environmental allergies and food allergies.  Neurological: Negative for dizziness and headaches.  Hematological: Does not bruise/bleed easily.  Psychiatric/Behavioral: Positive for sleep disturbance. Negative for agitation, behavioral problems (depression) and hallucinations.    Physical Exam Vitals reviewed.  Constitutional:      Appearance: Normal appearance. She is obese.  Cardiovascular:     Rate and Rhythm: Normal rate and regular rhythm.     Pulses: Normal pulses.     Heart sounds: Normal heart sounds.  Pulmonary:     Effort: Pulmonary effort is normal.     Breath sounds: Normal breath sounds.  Musculoskeletal:        General: Normal range of motion.  Skin:    Comments: Atopic dermatitis with evidence of pruritis to lower arms, abdomen and back, crosses dermatomes  Neurological:     Mental Status: She is alert.    Assessment/Plan: 1. Atopic dermatitis, unspecified type Apply hydrocortisone she already has at home, due to intense pruritis and sleep disturbance treat with low dose and short course prednisone Advised to contact office if symptoms worsen or have not improved in 1 week - predniSONE (DELTASONE) 10 MG tablet; Take 1 tablet three times a day with a meal for three for three days, take 1 tablet by twice daily with a meal for 3 days, take 1 tablet once daily with a meal for 3 days  Dispense: 18 tablet; Refill: 0  2. Essential hypertension BP slightly elevated today, due to acute symptoms will monitor at this time, if remains  elevated at next office visit will adjust therapy Encouraged to monitor BP at home and contact office if remains elevated  General Counseling: [name] understanding of the findings of todays visit and agrees with plan of treatment. I have discussed any further diagnostic evaluation that may be needed or ordered today. We also reviewed her medications today. she has been encouraged to call the office with any questions or concerns that should arise related to todays visit.   Meds ordered this encounter  Medications  . predniSONE (DELTASONE) 10 MG tablet    Sig: Take 1 tablet three times a day with a meal for three for three days, take 1 tablet by twice daily with a meal for 3  days, take 1 tablet once daily with a meal for 3 days    Dispense:  18 tablet    Refill:  0    Time spent: 25 Minutes Time spent includes review of chart, medications, test results and follow-up plan with the patient.  This patient was seen by Theodoro Grist AGNP-C in Collaboration with Dr Lavera Guise as a part of collaborative care agreement.  Tanna Furry [facility] Internal Medicine

## 2020-04-25 ENCOUNTER — Other Ambulatory Visit: Payer: Self-pay

## 2020-04-25 MED ORDER — ROSUVASTATIN CALCIUM 20 MG PO TABS: ORAL_TABLET | ORAL | 3 refills | Status: DC

## 2020-04-26 ENCOUNTER — Other Ambulatory Visit: Payer: Self-pay

## 2020-04-26 MED ORDER — TRULICITY 1.5 MG/0.5ML ~~LOC~~ SOAJ: 1.5000 mg | SUBCUTANEOUS | 3 refills | Status: DC

## 2020-04-27 ENCOUNTER — Encounter: Payer: Self-pay | Admitting: Hospice and Palliative Medicine

## 2020-05-01 ENCOUNTER — Telehealth: Payer: Self-pay

## 2020-05-01 ENCOUNTER — Other Ambulatory Visit: Payer: Self-pay

## 2020-05-01 DIAGNOSIS — E1142 Type 2 diabetes mellitus with diabetic polyneuropathy: Secondary | ICD-10-CM

## 2020-05-01 MED ORDER — PREGABALIN 100 MG PO CAPS: 100.0000 mg | ORAL_CAPSULE | Freq: Three times a day (TID) | ORAL | 3 refills | Status: DC

## 2020-05-02 ENCOUNTER — Other Ambulatory Visit: Payer: Self-pay | Admitting: Nurse Practitioner

## 2020-05-02 DIAGNOSIS — E1142 Type 2 diabetes mellitus with diabetic polyneuropathy: Secondary | ICD-10-CM

## 2020-05-02 MED ORDER — PREGABALIN 100 MG PO CAPS: 100.0000 mg | ORAL_CAPSULE | Freq: Three times a day (TID) | ORAL | 1 refills | Status: DC

## 2020-05-02 NOTE — Telephone Encounter (Signed)
Filled for 30 days plus a refill. Sent to [facility].

## 2020-05-11 ENCOUNTER — Other Ambulatory Visit: Payer: Self-pay | Admitting: Hospice and Palliative Medicine

## 2020-05-11 ENCOUNTER — Telehealth: Payer: Self-pay

## 2020-05-11 DIAGNOSIS — L239 Allergic contact dermatitis, unspecified cause: Secondary | ICD-10-CM

## 2020-05-11 NOTE — Telephone Encounter (Signed)
I will place referral to dermatology.

## 2020-05-11 NOTE — Telephone Encounter (Signed)
Pt advised we put referral

## 2020-05-15 ENCOUNTER — Ambulatory Visit: Payer: Medicare HMO | Admitting: Dermatology

## 2020-05-15 ENCOUNTER — Other Ambulatory Visit: Payer: Self-pay

## 2020-05-15 ENCOUNTER — Telehealth: Payer: Self-pay

## 2020-05-15 DIAGNOSIS — R21 Rash and other nonspecific skin eruption: Secondary | ICD-10-CM | POA: Diagnosis not present

## 2020-05-15 DIAGNOSIS — L309 Dermatitis, unspecified: Secondary | ICD-10-CM | POA: Diagnosis not present

## 2020-05-15 DIAGNOSIS — L409 Psoriasis, unspecified: Secondary | ICD-10-CM | POA: Diagnosis not present

## 2020-05-15 MED ORDER — HYDROXYZINE HCL 10 MG PO TABS: ORAL_TABLET | ORAL | 1 refills | Status: DC

## 2020-05-15 MED ORDER — CLOBETASOL PROPIONATE 0.05 % EX SOLN: CUTANEOUS | 1 refills | Status: DC

## 2020-05-15 NOTE — Telephone Encounter (Signed)
Disregard message, pt was able to get on cancel list and has appointment with dermatology today. Pharrah Rottman

## 2020-05-15 NOTE — Progress Notes (Signed)
New Patient Visit  Subjective  Kara [name] is a 75 y.o. female who presents for the following: Rash (Itchy rash started on thighs April 15, 2020, spreading to arms, chest, back. No new lotions, detergents. She did start a new supplement called Blood Sugar 24 hour on November 6, but stopped taking January 16 because she didn't know if that could be causing her rash. She took Prednisone taper x 9 days, but didn't help much. She uses Sarna Original and Sensitive Skin, CeraVe Itch, Witch hazel, and has tried Benadryl.). No history of eczema. No similar rash in the past. Husband is not itching. No pets. She does have a history of psoriasis of the occipital scalp which has been present for years.  The following portions of the chart were reviewed this encounter and updated as appropriate:       Review of Systems:  No other skin or systemic complaints except as noted in HPI or Assessment and Plan.  Objective  Well appearing patient in no apparent distress; mood and affect are within normal limits.  A focused examination was performed including face, trunk, extremities. Relevant physical exam findings are noted in the Assessment and Plan.  Objective  Occipital Scalp: Well-demarcated erythematous papules/plaques with silvery scale, guttate pink scaly papules.   Objective  R neck, Right Antecubital Fossa: Multiple pink patches and papules with excoriations on thighs, arms, back, chest.     Assessment & Plan  Psoriasis Occipital Scalp  Observe, will treat on f/up pending bx results  Psoriasis is a chronic non-curable, but treatable genetic/hereditary disease that may have other systemic features affecting other organ systems such as joints (Psoriatic Arthritis). It is associated with an increased risk of inflammatory bowel disease, heart disease, non-alcoholic fatty liver disease, and depression.     Rash and other nonspecific skin eruption (2) Right Antecubital Fossa; R  neck  Dermatitis vrs drug reaction vrs psoriasis with severe itching  Start clobetasol solution mixed with CeraVe Cream Apply twice daily all over. Dsp 19mL 1Rf. Avoid face, groin, axilla.  Start hydroxyzine 10mg  take 1-3 po qhs prn itch dsp #90 1Rf.  Topical steroids (such as triamcinolone, fluocinolone, fluocinonide, mometasone, clobetasol, halobetasol, betamethasone, hydrocortisone) can cause thinning and lightening of the skin if they are used for too long in the same area. Your physician has selected the right strength medicine for your problem and area affected on the body. Please use your medication only as directed by your physician to prevent side effects.    Skin / nail biopsy - Right Antecubital Fossa Type of biopsy: punch   Informed consent: discussed and consent obtained   Patient was prepped and draped in usual sterile fashion: Area prepped with alcohol. Anesthesia: the lesion was anesthetized in a standard fashion   Anesthetic:  1% lidocaine w/ epinephrine 1-100,000 buffered w/ 8.4% NaHCO3 Punch size:  3.5 mm Suture size:  4-0 Suture type: nylon   Suture removal (days):  7 Hemostasis achieved with: suture and pressure   Outcome: patient tolerated procedure well   Post-procedure details: wound care instructions given   Post-procedure details comment:  Ointment and pressure dressing applied  Skin / nail biopsy - R neck Type of biopsy: punch   Informed consent: discussed and consent obtained   Patient was prepped and draped in usual sterile fashion: Area prepped with alcohol. Anesthesia: the lesion was anesthetized in a standard fashion   Anesthetic:  1% lidocaine w/ epinephrine 1-100,000 buffered w/ 8.4% NaHCO3 Punch size:  3.5  mm Suture size:  4-0 Suture type: nylon   Suture removal (days):  7 Hemostasis achieved with: suture and pressure   Outcome: patient tolerated procedure well   Post-procedure details: wound care instructions given   Post-procedure details  comment:  Ointment and pressure dressing applied  clobetasol (TEMOVATE) 0.05 % external solution - R neck, Right Antecubital Fossa  hydrOXYzine (ATARAX/VISTARIL) 10 MG tablet - R neck, Right Antecubital Fossa  Specimen 1 - Surgical pathology Differential Diagnosis: Dermatitis vs Drug Eruption vs other Check Margins: No Multiple pink patches and papules with excoriations on thighs, arms, back, chest.   Specimen 2 - Surgical pathology Differential Diagnosis: Dermatitis vs Drug Eruption vs other Check Margins: No Multiple pink patches and papules with excoriations on thighs, arms, back, chest.   Return in about 1 week (around 05/22/2020) for Bx f/u, suture removal.  I, Jamesetta Orleans, CMA, am acting as scribe for [name], MD .  Documentation: I have reviewed the above documentation for accuracy and completeness, and I agree with the above.  [name] MD

## 2020-05-15 NOTE — Patient Instructions (Addendum)
Eczema Skin Care  Buy TWO 16oz jars of CeraVe moisturizing cream  [facility], [facility], [facility] (no prescription needed)  Costs about $15 per jar   Jar #1: Use as a moisturizer as needed. Can be applied to any area of the body. Use twice daily to unaffected areas.  Jar #2: Pour one 61ml bottle of clobetasol 0.05% solution into jar, mix well. Label this jar to indicate the medication has been added. Use twice daily to affected areas. Do not apply to face, groin or underarms.  Moisturizer may burn or sting initially. Try for at least 4 weeks.    Wound Care Instructions  1. Cleanse wound gently with soap and water once a day then pat dry with clean gauze. Apply a thing coat of Petrolatum (petroleum jelly, "Vaseline") over the wound (unless you have an allergy to this). We recommend that you use a new, sterile tube of Vaseline. Do not pick or remove scabs. Do not remove the yellow or white "healing tissue" from the base of the wound.  2. Cover the wound with fresh, clean, nonstick gauze and secure with paper tape. You may use Band-Aids in place of gauze and tape if the would is small enough, but would recommend trimming much of the tape off as there is often too much. Sometimes Band-Aids can irritate the skin.  3. You should call the office for your biopsy report after 1 week if you have not already been contacted.  4. If you experience any problems, such as abnormal amounts of bleeding, swelling, significant bruising, significant pain, or evidence of infection, please call the office immediately.

## 2020-05-16 DIAGNOSIS — H34832 Tributary (branch) retinal vein occlusion, left eye, with macular edema: Secondary | ICD-10-CM | POA: Diagnosis not present

## 2020-05-22 ENCOUNTER — Ambulatory Visit (INDEPENDENT_AMBULATORY_CARE_PROVIDER_SITE_OTHER): Payer: Medicare HMO | Admitting: Dermatology

## 2020-05-22 ENCOUNTER — Other Ambulatory Visit: Payer: Self-pay

## 2020-05-22 DIAGNOSIS — L918 Other hypertrophic disorders of the skin: Secondary | ICD-10-CM

## 2020-05-22 DIAGNOSIS — R21 Rash and other nonspecific skin eruption: Secondary | ICD-10-CM | POA: Diagnosis not present

## 2020-05-22 DIAGNOSIS — L308 Other specified dermatitis: Secondary | ICD-10-CM | POA: Diagnosis not present

## 2020-05-22 MED ORDER — TRIAMCINOLONE ACETONIDE 0.1 % EX CREA: 1.0000 "application " | TOPICAL_CREAM | Freq: Two times a day (BID) | CUTANEOUS | 0 refills | Status: DC | PRN

## 2020-05-22 NOTE — Patient Instructions (Addendum)
Wound Care Instructions  1. Cleanse wound gently with soap and water once a day then pat dry with clean gauze. Apply a thing coat of Petrolatum (petroleum jelly, "Vaseline") over the wound (unless you have an allergy to this). We recommend that you use a new, sterile tube of Vaseline. Do not pick or remove scabs. Do not remove the yellow or white "healing tissue" from the base of the wound.  2. Cover the wound with fresh, clean, nonstick gauze and secure with paper tape. You may use Band-Aids in place of gauze and tape if the would is small enough, but would recommend trimming much of the tape off as there is often too much. Sometimes Band-Aids can irritate the skin.  3. You should call the office for your biopsy report after 1 week if you have not already been contacted.  4. If you experience any problems, such as abnormal amounts of bleeding, swelling, significant bruising, significant pain, or evidence of infection, please call the office immediately.   Topical steroids (such as triamcinolone, fluocinolone, fluocinonide, mometasone, clobetasol, halobetasol, betamethasone, hydrocortisone) can cause thinning and lightening of the skin if they are used for too long in the same area. Your physician has selected the right strength medicine for your problem and area affected on the body. Please use your medication only as directed by your physician to prevent side effects.

## 2020-05-22 NOTE — Progress Notes (Signed)
Follow-Up Visit   Subjective  Kara [name] is a 75 y.o. 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 Patient has improved some using clobetasol/CeraVe mix twice a day and Sarna lotion during the day. She tried taking hydroxyzine at night, but made her too drowsy the next day. She does have new spots on her lower legs. Kara [name] also has tags on back and under right arm that are irritated by clothing.).   The following portions of the chart were reviewed this encounter and updated as appropriate:       Review of Systems:  No other skin or systemic complaints except as noted in HPI or Assessment and Plan.  Objective  Well appearing patient in no apparent distress; mood and affect are within normal limits.  A focused examination was performed including face, trunk, extremities. Relevant physical exam findings are noted in the Assessment and Plan.  Objective  Arms, legs, trunk, Left Upper Back: Multiple pink patches and papules with excoriations on thighs, arms, back, chest.   Assessment & Plan   Acrochordons (Skin Tags), irritated - Removal desired by patient - Fleshy, skin-colored pedunculated papules - Benign appearing.  - Patient desires removal.- Prior to the procedure, reviewed the expected small wound. Also reviewed the risk of leaving a small scar and the small risk of infection.  PROCEDURE - The areas were prepped with isopropyl alcohol. A small amount of lidocaine 1% with epinephrine was injected at the base of each lesion to achieve good local anesthesia. The skin tags were removed using a snip technique. Aluminum chloride was used for hemostasis. Petrolatum and a bandage were applied. The procedure was tolerated well. - Wound care was reviewed with the patient. They were advised to call with any concerns. Total number of treated acrochordons 3 (L mid back at braline x 1, R axilla x 2)   Rash  (2) Arms, legs, trunk; Left Upper Back  Some improvement in itch but rash still present and spreading  PERIVASCULAR DERMATITIS WITH EOSINOPHILS AND FOCAL SPONGIOSIS, biopsy proven. Punch biopsy for DIF today on the left upper back to r/o urticarial BP. If negative, consider Drug Rash possibly to Crestor vs Possible Scabies (husband not itching).  Patient is almost out of Clobetasol/CeraVe mix and too soon to refill. Start TMC 0.1% Cream Apply to AAs rash BID until improved dsp 453.6g 0Rf.   Topical steroids (such as triamcinolone, fluocinolone, fluocinonide, mometasone, clobetasol, halobetasol, betamethasone, hydrocortisone) can cause thinning and lightening of the skin if they are used for too long in the same area. Your physician has selected the right strength medicine for your problem and area affected on the body. Please use your medication only as directed by your physician to prevent side effects.    Skin / nail biopsy - Left Upper Back Type of biopsy: punch   Informed consent: discussed and consent obtained   Patient was prepped and draped in usual sterile fashion: Area prepped with alcohol. Anesthesia: the lesion was anesthetized in a standard fashion   Anesthetic:  1% lidocaine w/ epinephrine 1-100,000 buffered w/ 8.4% NaHCO3 Punch size:  3.5 mm Suture type: nylon   Suture removal (days):  7 Hemostasis achieved with: suture and pressure   Outcome: patient tolerated procedure well   Post-procedure details: wound care instructions given   Post-procedure details comment:  Ointment and pressure dressing applied  triamcinolone (KENALOG) 0.1 % - Arms, legs, trunk  Specimen 1 - Surgical pathology  Differential Diagnosis: R/O Urticarial BP Check Margins: No Multiple pink patches and papules with excoriations on thighs, arms, back, chest. Previous biopsy [id number]  Return in about 1 week (around 05/29/2020) for biopsy f/u and SR.  I, Jamesetta Orleans, CMA, am acting as scribe for  [name], MD .  Documentation: I have reviewed the above documentation for accuracy and completeness, and I agree with the above.  [name] MD

## 2020-05-29 ENCOUNTER — Other Ambulatory Visit: Payer: Self-pay

## 2020-05-29 ENCOUNTER — Ambulatory Visit (INDEPENDENT_AMBULATORY_CARE_PROVIDER_SITE_OTHER): Payer: Medicare HMO | Admitting: Dermatology

## 2020-05-29 DIAGNOSIS — L120 Bullous pemphigoid: Secondary | ICD-10-CM | POA: Diagnosis not present

## 2020-05-29 MED ORDER — CLOBETASOL PROPIONATE 0.05 % EX CREA: TOPICAL_CREAM | CUTANEOUS | 3 refills | Status: DC

## 2020-05-29 MED ORDER — DOXYCYCLINE MONOHYDRATE 100 MG PO CAPS: 100.0000 mg | ORAL_CAPSULE | Freq: Two times a day (BID) | ORAL | 5 refills | Status: DC

## 2020-05-29 NOTE — Progress Notes (Signed)
   Follow-Up Visit   Subjective  Kara [name] is a 75 y.o. female who presents for the following: Biopsy follow-up (Biopsy proven Bullous Pemphigoid.  She is using triamcinolone 0.1% cream. Itch is improving, but she is still getting new spots on her lower legs.). Hx of radiation poisoning from breast cancer treatment 01/2019.   The following portions of the chart were reviewed this encounter and updated as appropriate:       Review of Systems:  No other skin or systemic complaints except as noted in HPI or Assessment and Plan.  Objective  Well appearing patient in no apparent distress; mood and affect are within normal limits.  A focused examination was performed including face, arms, legs, back. Relevant physical exam findings are noted in the Assessment and Plan.  Objective  legs, arms, trunk: Numerous pink papules, some with crusting.  Multiple excoriations.   Assessment & Plan  Bullous pemphigoid legs, arms, trunk  Urticarial BP, Biopsy proven. DIF positive.   Suture removed from left upper back. Detailed discussion of condition with patient and husband.  Chronic autoimmune blistering condition.  In her situation, she gets urticarial type lesions.  Discussed oral Prednisone including potential side-effects with long term use. Patient defers due to side effects (pt has DM and HTN) and hx of breast cancer. Currently on tamoxifen.  Start doxycycline 100mg  take 1 po BID with food dsp #60 5Rf. Start Clobetasol cream spot treat bid to more severe areas dsp 120g (due to large BSA to treat) 3Rf. Avoid face, groin, axilla. Continue TMC 0.1% Cream qd/bid all over Mon-Fri, off on the weekends. Avoid f/g/a. Recommend Eucerin Anti-Itch or CeraVe Itch Relief on the weekends. Eucerin Advanced Repair Cream or other as a general moisturizer all over.  BP can be drug-induced, furosemide most commonly associated medication on her list. If pt not improving with above treatment, she should  discuss with PCP about stopping furosemide to see if it helps with rash clearance.  Doxycycline should be taken with food to prevent nausea. Do not lay down for 30 minutes after taking. Be cautious with sun exposure and use good sun protection while on this medication. Pregnant women should not take this medication.    clobetasol cream (TEMOVATE) 0.05 % - legs, arms, trunk  doxycycline (MONODOX) 100 MG capsule - legs, arms, trunk  Return in about 1 month (around 06/26/2020) for Bullous Pemphigoid.Lindi Adie, CMA, am acting as scribe for [name], MD .  Documentation: I have reviewed the above documentation for accuracy and completeness, and I agree with the above.  [name] MD

## 2020-05-29 NOTE — Patient Instructions (Addendum)
Bullous Pemphigoid, biopsy proven.  Start clobetasol cream to more severe areas one to two times a day until improved. Avoid face, groin, underarms.  Continue triamcinolone 0.1% cream one to two times daily to rash all over until improved Mon-Fri, off on the weekends. Avoid face, groin, underarms. Recommend Eucerin Anti-itch or CeraVe Itch Relief on the weekends.  Eucerin Advanced Repair or other all over as a general moisturizer all over.  Topical steroids (such as triamcinolone, fluocinolone, fluocinonide, mometasone, clobetasol, halobetasol, betamethasone, hydrocortisone) can cause thinning and lightening of the skin if they are used for too long in the same area. Your physician has selected the right strength medicine for your problem and area affected on the body. Please use your medication only as directed by your physician to prevent side effects.   Start doxycycline monohydrate 100mg  take 1 capsule twice a day with food.  Doxycycline should be taken with food to prevent nausea. Do not lay down for 30 minutes after taking. Be cautious with sun exposure and use good sun protection while on this medication. Pregnant women should not take this medication.   Ask doctor about discontinuing furosemide to help with rash clearance.

## 2020-06-21 ENCOUNTER — Telehealth: Payer: Self-pay

## 2020-06-21 NOTE — Telephone Encounter (Signed)
Patient advised of information per Dr. Nicole Kindred.

## 2020-06-21 NOTE — Telephone Encounter (Signed)
Patient called in and left message that she was advised to d/c fluid medication at last appointment. Patient states her feet are so swollen she feels like her skin is going to bust. Please advise.

## 2020-06-21 NOTE — Telephone Encounter (Signed)
Has the Bullous Pemphigoid improved?  Either way, she can restart the lasix, and notice if her itchy rash gets worse once she goes back on it.  If there isn't any worsening then she can continue taking it, but if the BP flares, then her PCP will need to find another fluid pill that she can take other than Lasix.

## 2020-06-26 ENCOUNTER — Other Ambulatory Visit: Payer: Self-pay

## 2020-06-26 DIAGNOSIS — I10 Essential (primary) hypertension: Secondary | ICD-10-CM

## 2020-06-26 MED ORDER — VERAPAMIL HCL 80 MG PO TABS: ORAL_TABLET | ORAL | 3 refills | Status: DC

## 2020-06-28 ENCOUNTER — Other Ambulatory Visit: Payer: Self-pay

## 2020-06-28 ENCOUNTER — Ambulatory Visit: Payer: Medicare HMO | Admitting: Dermatology

## 2020-06-28 ENCOUNTER — Encounter: Payer: Self-pay | Admitting: Dermatology

## 2020-06-28 DIAGNOSIS — D489 Neoplasm of uncertain behavior, unspecified: Secondary | ICD-10-CM

## 2020-06-28 DIAGNOSIS — I781 Nevus, non-neoplastic: Secondary | ICD-10-CM | POA: Diagnosis not present

## 2020-06-28 DIAGNOSIS — R21 Rash and other nonspecific skin eruption: Secondary | ICD-10-CM | POA: Diagnosis not present

## 2020-06-28 DIAGNOSIS — L120 Bullous pemphigoid: Secondary | ICD-10-CM

## 2020-06-28 DIAGNOSIS — D485 Neoplasm of uncertain behavior of skin: Secondary | ICD-10-CM | POA: Diagnosis not present

## 2020-06-28 MED ORDER — TRIAMCINOLONE ACETONIDE 0.1 % EX CREA: 1.0000 "application " | TOPICAL_CREAM | Freq: Two times a day (BID) | CUTANEOUS | 2 refills | Status: DC | PRN

## 2020-06-28 NOTE — Progress Notes (Signed)
   Follow-Up Visit   Subjective  Kara [name] is a 75 y.o. female who presents for the following: Follow-up (Biopsy proven Bullous Pemphigoid. 1 month recheck. Taking Doxycycline 100mg  1 po bid, using Clobetasol cream to worse areas, using Triamcinolone cream as directed. Patient reports still breaking out in new spots, worse at legs, but has improved. D/C Furosemide and gained 7lbs in 2 weeks, restarted and has lost the 7lbs. ) and Skin Problem (Check spot on left cheek. Dur: years. Dark in color. Cannot cover with make up. Would like removed. ).   The following portions of the chart were reviewed this encounter and updated as appropriate:      Review of Systems: No other skin or systemic complaints except as noted in HPI or Assessment and Plan.  Objective  Well appearing patient in no apparent distress; mood and affect are within normal limits.  A focused examination was performed including face, torso, limbs. Relevant physical exam findings are noted in the Assessment and Plan.  Objective  arms, legs, torso: Multiple pink papules, violaceous macules on legs, some crusted, violaceous macules on arms.  Objective  Left Cheek: 1.73mm violaceous macule     Assessment & Plan  Bullous pemphigoid arms, legs, torso  Chronic autoimmune condition, improving, but not at goal.  Still getting new spots and itching, but not as much as before  Discussed starting prednisone vrs methotrexate and potential side effects.  She prefers to continue treatment as is, since she has improved some.  Okay to use Triamcinolone on face, spot treat sparingly. Continue to body bid.  Rf sent   Continue Clobetasol cream bid to spot treat new itchy areas.  Continue Hydroxyzine 10mg  1-3 po qhs PRN itching  Continue Doxycycline 100 mg PO bid as directed.  Start OTC Zyrtec (Cetirizine) 10 mg PO in the morning for itch.   Other Related Medications clobetasol cream (TEMOVATE) 0.05 % doxycycline (MONODOX)  100 MG capsule  Rash  Reordered Medications triamcinolone (KENALOG) 0.1 %  Rash and other nonspecific skin eruption  Other Related Medications clobetasol (TEMOVATE) 0.05 % external solution hydrOXYzine (ATARAX/VISTARIL) 10 MG tablet  Neoplasm of uncertain behavior Left Cheek  Epidermal / dermal shaving  Lesion diameter (cm):  0.3 Informed consent: discussed and consent obtained   Patient was prepped and draped in usual sterile fashion: Area prepped with alcohol. Anesthesia: the lesion was anesthetized in a standard fashion   Anesthetic:  1% lidocaine w/ epinephrine 1-100,000 buffered w/ 8.4% NaHCO3 Instrument used: flexible razor blade   Hemostasis achieved with: pressure, aluminum chloride and electrodesiccation   Outcome: patient tolerated procedure well   Post-procedure details: wound care instructions given   Post-procedure details comment:  Ointment and small bandage applied  Specimen 1 - Surgical pathology Differential Diagnosis: hemangioma vs blue nevus  Check Margins: No 1.54mm violaceous macule  Hemangioma vs blue nevus    Return in about 1 month (around 07/29/2020) for recheck bullous pemphigoid.   I, Kara [name], CMA, am acting as scribe for Kara Patty, MD.  Documentation: I have reviewed the above documentation for accuracy and completeness, and I agree with the above.  Kara Patty MD

## 2020-06-28 NOTE — Patient Instructions (Addendum)
Okay to use Triamcinolone on face, sparingly. Continue Monday-Friday to body areas as directed.  Continue Clobetasol cream to spot treat areas.  Continue Hydroxyzine 10mg  at bedtime as needed for itching  Continue Doxycycline as directed.   Can take Zyrtec (Cetirizine) in the morning.   Wound Care Instructions  1. Cleanse wound gently with soap and water once a day then pat dry with clean gauze. Apply a thing coat of Petrolatum (petroleum jelly, "Vaseline") over the wound (unless you have an allergy to this). We recommend that you use a new, sterile tube of Vaseline. Do not pick or remove scabs. Do not remove the yellow or white "healing tissue" from the base of the wound.  2. Cover the wound with fresh, clean, nonstick gauze and secure with paper tape. You may use Band-Aids in place of gauze and tape if the would is small enough, but would recommend trimming much of the tape off as there is often too much. Sometimes Band-Aids can irritate the skin.  3. You should call the office for your biopsy report after 1 week if you have not already been contacted.  4. If you experience any problems, such as abnormal amounts of bleeding, swelling, significant bruising, significant pain, or evidence of infection, please call the office immediately.  5. FOR ADULT SURGERY PATIENTS: If you need something for pain relief you may take 1 extra strength Tylenol (acetaminophen) AND 2 Ibuprofen (200mg  each) together every 4 hours as needed for pain. (do not take these if you are allergic to them or if you have a reason you should not take them.) Typically, you may only need pain medication for 1 to 3 days.

## 2020-07-03 ENCOUNTER — Other Ambulatory Visit: Payer: Self-pay

## 2020-07-03 ENCOUNTER — Telehealth: Payer: Self-pay

## 2020-07-03 ENCOUNTER — Ambulatory Visit (INDEPENDENT_AMBULATORY_CARE_PROVIDER_SITE_OTHER): Payer: Medicare HMO | Admitting: Hospice and Palliative Medicine

## 2020-07-03 ENCOUNTER — Encounter: Payer: Self-pay | Admitting: Hospice and Palliative Medicine

## 2020-07-03 VITALS — BP 128/82 | HR 62 | Temp 96.9°F | Resp 16 | Ht 64.0 in | Wt 207.8 lb

## 2020-07-03 DIAGNOSIS — I10 Essential (primary) hypertension: Secondary | ICD-10-CM | POA: Diagnosis not present

## 2020-07-03 DIAGNOSIS — L120 Bullous pemphigoid: Secondary | ICD-10-CM

## 2020-07-03 DIAGNOSIS — E1142 Type 2 diabetes mellitus with diabetic polyneuropathy: Secondary | ICD-10-CM

## 2020-07-03 LAB — POCT GLYCOSYLATED HEMOGLOBIN (HGB A1C): Hemoglobin A1C: 6.7 % — AB (ref 4.0–5.6)

## 2020-07-03 MED ORDER — VERAPAMIL HCL 40 MG PO TABS: 40.0000 mg | ORAL_TABLET | Freq: Two times a day (BID) | ORAL | 1 refills | Status: DC

## 2020-07-03 NOTE — Telephone Encounter (Signed)
-----   Message from [name], MD sent at 07/03/2020 12:13 PM EDT ----- Skin , left cheek TELANGIECTASIA  Benign blood vessel

## 2020-07-03 NOTE — Telephone Encounter (Signed)
Patient informed of pathology results 

## 2020-07-03 NOTE — Progress Notes (Signed)
[facility] - [place], [place] [address]  Internal MEDICINE  Office Visit Note  Patient Name: Kara [name]  [id number]  [id number]  Date of Service: 07/03/2020  Chief Complaint  Patient presents with  . Follow-up    Bullous pemphigoid  . Hyperlipidemia  . Hypertension  . Diabetes  . Anemia  . Quality Metric Gaps    Eye exam scheduled    HPI Patient is here for routine follow-up Has been seen by dermatology since our last visit for non-specific rash Has since been diagnosed with bullous pemphigoid--continuing to have new spots every few days, itching has improved but still present Discussing treatment options prednisone vs Methotrexate Discussed prednisone and risk of hyperglycemia Will need to discuss Methotrexate with her oncologist as she is currently taking Tamoxifen  DM-concerned about her A1C levels, while she was on tapered course of prednisone her glucose levels stayed in upper 200's Confusion regarding long-acting insulin and unsure if she was also able to utilize sliding scale insulin therapy  Requesting updated prescription of verapamil as she has been taking 40 mg BID for several months and cutting 80 mg tablets in half has become challenging  Current Medication: Outpatient Encounter Medications as of 07/03/2020  Medication Sig  . verapamil (CALAN) 40 MG tablet Take 1 tablet (40 mg total) by mouth 2 (two) times daily.  . APPLE CIDER VINEGAR PO Take 2 tablets by mouth daily.   Marland Kitchen aspirin EC 81 MG tablet Take 81 mg by mouth daily.   . cholecalciferol (VITAMIN D) 1000 UNITS tablet Take 2,000 Units by mouth daily.   . Ferrous Gluconate 324 (37.5 Fe) MG TABS Take 324 mg by mouth 2 (two) times daily.   . furosemide (LASIX) 20 MG tablet Take 20 mg by mouth daily.  . insulin aspart (NOVOLOG FLEXPEN) 100 UNIT/ML FlexPen Inject [place] twice daily as directed per sliding scale. Max daily dose is 30 units. Dx E11.65.  Marland Kitchen insulin detemir (LEVEMIR  FLEXTOUCH) 100 UNIT/ML FlexPen Inject up to 75 units [place] daily  . Lactobacillus (PROBIOTIC ACIDOPHILUS) CAPS Take 1 capsule by mouth daily.  Marland Kitchen lisinopril (ZESTRIL) 10 MG tablet Take 10 mg by mouth daily.  . Multiple Vitamin (MULTIVITAMIN) tablet Take 1 tablet by mouth daily at 12 noon.  . pantoprazole (PROTONIX) 40 MG tablet TAKE 1 TABLET(S) BY MOUTH DAILY  . tamoxifen (NOLVADEX) 20 MG tablet Take 1 tablet (20 mg total) by mouth daily.  . traMADol-acetaminophen (ULTRACET) 37.5-325 MG tablet Take 1 tablet by mouth 2 (two) times daily as needed for moderate pain.  Marland Kitchen triamcinolone (KENALOG) 0.025 % cream Apply 1 application topically 2 (two) times daily.  . [DISCONTINUED] pregabalin (LYRICA) 100 MG capsule Take 1 capsule (100 mg total) by mouth 3 (three) times daily.  . [DISCONTINUED] rosuvastatin (CRESTOR) 20 MG tablet TAKE 1 TABLET BY MOUTH EVERYDAY AT BEDTIME  . [DISCONTINUED] verapamil (CALAN) 80 MG tablet Take 1 tablet po bid   No facility-administered encounter medications on file as of 07/03/2020.    Surgical History: Past Surgical History:  Procedure Laterality Date  . BILATERAL SALPINGECTOMY    . BREAST BIOPSY Right 2014   neg- core  . BREAST BIOPSY Right 12/21/2018   [place] bx, venus clip,  DUCTAL CARCINOMA IN SITU  . BREAST BIOPSY Right 12/21/2018   [place] bx, ribbon clip,  FIBROEPITHELIAL PROLIFERATION WITH SCLEROSIS  . BREAST BIOPSY Right 01/01/2019   Affirm bx "X" clip-path pending  . BREAST DUCTAL SYSTEM EXCISION    .  BREAST EXCISIONAL BIOPSY Right 2014   neg  . BREAST LUMPECTOMY Right 01/22/2019   path pending  . CATARACT EXTRACTION    . CHOLECYSTECTOMY    . CYSTOSCOPY WITH STENT PLACEMENT Left 04/14/2015   Procedure: CYSTOSCOPY WITH STENT PLACEMENT;  Surgeon: Hollice Espy, MD;  Location: [facility];  Service: Urology;  Laterality: Left;  . EXTRACORPOREAL SHOCK WAVE LITHOTRIPSY Left 09/29/2014   Procedure: EXTRACORPOREAL SHOCK WAVE LITHOTRIPSY (ESWL);  Surgeon: Hollice Espy,  MD;  Location: [facility];  Service: Urology;  Laterality: Left;  . EYE SURGERY     bilateral cataract  . IRRIGATION AND DEBRIDEMENT ABSCESS Right 03/24/2019   Procedure: IRRIGATION AND DEBRIDEMENT ABSCESS RIGHT BREAST;  Surgeon: Herbert Pun, MD;  Location: [facility];  Service: General;  Laterality: Right;  . LAPAROSCOPIC OOPHERECTOMY Left   . PARTIAL MASTECTOMY WITH NEEDLE LOCALIZATION Right 01/22/2019   Procedure: PARTIAL MASTECTOMY WITH NEEDLE LOCALIZATION;  Surgeon: Herbert Pun, MD;  Location: [facility];  Service: General;  Laterality: Right;  . TONSILLECTOMY    . TUBAL LIGATION    . URETEROSCOPY WITH HOLMIUM LASER LITHOTRIPSY Left 04/14/2015   Procedure: URETEROSCOPY WITH HOLMIUM LASER LITHOTRIPSY;  Surgeon: Hollice Espy, MD;  Location: [facility];  Service: Urology;  Laterality: Left;    Medical History: Past Medical History:  Diagnosis Date  . Anemia   . Arthropathy   . Benign neoplasm of breast   . Breast cancer ([place])   . Chest pain   . Chronic kidney disease    30% kidney function  . Diabetes ([place])   . Diabetic retinopathy ([place])   . Dyspnea   . Edema   . History of kidney stones   . Hyperlipemia   . Hypersomnia with sleep apnea   . Hypertension   . 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 Lumbago   . Osteoarthrosis   . Ovarian failure   . Personal history of radiation therapy   . SOB (shortness of breath)     Family History: Family History  Problem Relation Age of Onset  . Breast cancer Maternal Grandmother 54  . Colon cancer Mother   . Lung cancer Father     Social History   Socioeconomic History  . Marital status: Married    Spouse name: Not on file  . Number of children: Not on file  . Years of education: Not on file  . Highest education level: Not on file  Occupational History  . Not on file  Tobacco Use  . Smoking status: Former Smoker    Packs/day: 1.00    Years: 30.00    Pack years: 30.00    Types: Cigarettes    Quit  date: 04/11/1995    Years since quitting: 25.2  . Smokeless tobacco: Never Used  Vaping Use  . Vaping Use: Never used  Substance and Sexual Activity  . Alcohol use: No    Alcohol/week: 0.0 standard drinks  . Drug use: No  . Sexual activity: Not on file  Other Topics Concern  . Not on file  Social History Narrative  . Not on file   Social Determinants of Health   Financial Resource Strain: Not on file  Food Insecurity: Not on file  Transportation Needs: Not on file  Physical Activity: Not on file  Stress: Not on file  Social Connections: Not on file  Intimate Partner Violence: Not on file      Review of Systems  Constitutional: Negative for chills, diaphoresis and fatigue.  HENT: Negative  for ear pain, postnasal drip and sinus pressure.   Eyes: Negative for photophobia, discharge, redness, itching and visual disturbance.  Respiratory: Negative for cough, shortness of breath and wheezing.   Cardiovascular: Negative for chest pain, palpitations and leg swelling.  Gastrointestinal: Negative for abdominal pain, constipation, diarrhea, nausea and vomiting.  Genitourinary: Negative for dysuria and flank pain.  Musculoskeletal: Negative for arthralgias, back pain, gait problem and neck pain.  Skin: Positive for rash. Negative for color change.  Allergic/Immunologic: Negative for environmental allergies and food allergies.  Neurological: Negative for dizziness and headaches.  Hematological: Does not bruise/bleed easily.  Psychiatric/Behavioral: Negative for agitation, behavioral problems (depression) and hallucinations.    Vital Signs: BP 128/82   Pulse 62   Temp (!) 96.9 F (36.1 C)   Resp 16   Ht 5\' 4"  (1.626 m)   Wt 207 lb 12.8 oz (94.3 kg)   SpO2 97%   BMI 35.67 kg/m    Physical Exam Vitals reviewed.  Constitutional:      Appearance: Normal appearance. She is obese.  Cardiovascular:     Rate and Rhythm: Normal rate and regular rhythm.     Pulses: Normal  pulses.     Heart sounds: Normal heart sounds.  Pulmonary:     Effort: Pulmonary effort is normal.     Breath sounds: Normal breath sounds.  Abdominal:     General: Abdomen is flat.     Palpations: Abdomen is soft.  Musculoskeletal:        General: Normal range of motion.     Cervical back: Normal range of motion.  Skin:    General: Skin is warm.     Comments: Scattered areas of wheels and breakouts  Neurological:     General: No focal deficit present.     Mental Status: She is alert and oriented to person, place, and time. Mental status is at baseline.  Psychiatric:        Mood and Affect: Mood normal.        Behavior: Behavior normal.        Thought Content: Thought content normal.        Judgment: Judgment normal.    Assessment/Plan: 1. Type 2 diabetes mellitus with peripheral neuropathy (HCC) A1C 6.7--well controlled Advised to continue with long acting insulin and utilize sliding scale insulin if glucose levels about 150 - POCT HgB A1C  2. Essential hypertension BP and HR well controlled, updated script sent for 40 mg BID - verapamil (CALAN) 40 MG tablet; Take 1 tablet (40 mg total) by mouth 2 (two) times daily.  Dispense: 180 tablet; Refill: 1  3. Bullous pemphigoid New diagnosis, followed by dermatology  General Counseling: [name] understanding of the findings of todays visit and agrees with plan of treatment. I have discussed any further diagnostic evaluation that may be needed or ordered today. We also reviewed her medications today. she has been encouraged to call the office with any questions or concerns that should arise related to todays visit.    Orders Placed This Encounter  Procedures  . POCT HgB A1C    Meds ordered this encounter  Medications  . verapamil (CALAN) 40 MG tablet    Sig: Take 1 tablet (40 mg total) by mouth 2 (two) times daily.    Dispense:  180 tablet    Refill:  1    Time spent: 30 Minutes Time spent includes review of  chart, medications, test results and follow-up plan with the patient.  This  patient was seen by Theodoro Grist AGNP-C in Collaboration with Dr Lavera Guise as a part of collaborative care agreement     Tanna Furry. Hetal Proano AGNP-C Internal medicine

## 2020-07-05 ENCOUNTER — Other Ambulatory Visit: Payer: Self-pay | Admitting: Dermatology

## 2020-07-05 DIAGNOSIS — R21 Rash and other nonspecific skin eruption: Secondary | ICD-10-CM

## 2020-07-07 NOTE — Progress Notes (Signed)
[place]  Telephone:([phone]) [phone] Fax:(336) [phone]  ID: Kara [name] OB: August 07, 1945  MR#: [id number]  FHQ#:[id number]  Patient Care Team: Lavera Guise, MD as PCP - General (Internal Medicine) Lloyd Huger, MD as Consulting Physician (Oncology) Herbert Pun, MD as Consulting Physician (General Surgery) Noreene Filbert, MD as Referring Physician (Radiation Oncology)  I connected with Kara [name] on 07/11/20 at  1:45 PM EDT by video enabled telemedicine visit and verified that I am speaking with the correct person using two identifiers.   I discussed the limitations, risks, security and privacy concerns of performing an evaluation and management service by telemedicine and the availability of in-person appointments. I also discussed with the patient that there may be a patient responsible charge related to this service. The patient expressed understanding and agreed to proceed.   Other persons participating in the visit and their role in the encounter: Patient, MD.  Patient's location: Home. Provider's location: Clinic.  CHIEF COMPLAINT: [name]-grade DCIS of the right breast.  INTERVAL HISTORY: Patient agreed to video assisted telemedicine visit for routine 62-monthevaluation.  She was recently diagnosed with bullous pemphigoid and continues to have a pruritic rash.  She has been treated with several topical creams, but her dermatologist is considering starting methotrexate.  She otherwise feels well.  She is tolerating tamoxifen without significant side effects. She has no neurologic complaints.  She denies any recent fevers or illnesses.  She has a good appetite and denies weight loss.  She has no chest pain, shortness of breath, cough, or hemoptysis.  She denies any nausea, vomiting, constipation, or diarrhea.  She has no urinary complaints.  Patient offers no further specific complaints today.  REVIEW OF SYSTEMS:   Review of Systems   Constitutional: Negative.  Negative for fever, malaise/fatigue and weight loss.  Respiratory: Negative.  Negative for cough, hemoptysis and shortness of breath.   Cardiovascular: Negative.  Negative for chest pain and leg swelling.  Gastrointestinal: Negative.  Negative for abdominal pain.  Genitourinary: Negative.  Negative for dysuria.  Musculoskeletal: Negative.  Negative for back pain and joint pain.  Skin: Positive for itching and rash.  Neurological: Negative.  Negative for dizziness, focal weakness, weakness and headaches.  Psychiatric/Behavioral: Negative.  The patient is not nervous/anxious.     As per HPI. Otherwise, a complete review of systems is negative.  PAST MEDICAL HISTORY: Past Medical History:  Diagnosis Date  . Anemia   . Arthropathy   . Benign neoplasm of breast   . [place]   . Chest pain   . Chronic kidney disease    30% kidney function  . Diabetes ([place]   . Diabetic retinopathy ([place]   . Dyspnea   . Edema   . History of kidney stones   . Hyperlipemia   . Hypersomnia with sleep apnea   . Hypertension   . 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[place]   . Lumbago   . Osteoarthrosis   . Ovarian failure   . Personal history of radiation therapy   . SOB (shortness of breath)     PAST SURGICAL HISTORY: Past Surgical History:  Procedure Laterality Date  . BILATERAL SALPINGECTOMY    . BREAST BIOPSY Right 2014   neg- core  . BREAST BIOPSY Right 12/21/2018   [place], venus clip,  DUCTAL CARCINOMA IN SITU  . BREAST BIOPSY Right 12/21/2018   [place], ribbon clip,  FIBROEPITHELIAL PROLIFERATION WITH SCLEROSIS  . BREAST BIOPSY Right 01/01/2019  Affirm bx "X" clip-path pending  . BREAST DUCTAL SYSTEM EXCISION    . BREAST EXCISIONAL BIOPSY Right 2014   neg  . BREAST LUMPECTOMY Right 01/22/2019   path pending  . CATARACT EXTRACTION    . CHOLECYSTECTOMY    . CYSTOSCOPY WITH STENT PLACEMENT Left 04/14/2015   Procedure: CYSTOSCOPY WITH STENT  PLACEMENT;  Surgeon: Hollice Espy, MD;  Location: [facility];  Service: Urology;  Laterality: Left;  . EXTRACORPOREAL SHOCK WAVE LITHOTRIPSY Left 09/29/2014   Procedure: EXTRACORPOREAL SHOCK WAVE LITHOTRIPSY (ESWL);  Surgeon: Hollice Espy, MD;  Location: [facility];  Service: Urology;  Laterality: Left;  . EYE SURGERY     bilateral cataract  . IRRIGATION AND DEBRIDEMENT ABSCESS Right 03/24/2019   Procedure: IRRIGATION AND DEBRIDEMENT ABSCESS RIGHT BREAST;  Surgeon: Herbert Pun, MD;  Location: [facility];  Service: General;  Laterality: Right;  . LAPAROSCOPIC OOPHERECTOMY Left   . PARTIAL MASTECTOMY WITH NEEDLE LOCALIZATION Right 01/22/2019   Procedure: PARTIAL MASTECTOMY WITH NEEDLE LOCALIZATION;  Surgeon: Herbert Pun, MD;  Location: [facility];  Service: General;  Laterality: Right;  . TONSILLECTOMY    . TUBAL LIGATION    . URETEROSCOPY WITH HOLMIUM LASER LITHOTRIPSY Left 04/14/2015   Procedure: URETEROSCOPY WITH HOLMIUM LASER LITHOTRIPSY;  Surgeon: Hollice Espy, MD;  Location: [facility];  Service: Urology;  Laterality: Left;    FAMILY HISTORY: Family History  Problem Relation Age of Onset  . Breast cancer Maternal Grandmother 63  . Colon cancer Mother   . Lung cancer Father     ADVANCED DIRECTIVES (Y/N):  N  HEALTH MAINTENANCE: Social History   Tobacco Use  . Smoking status: Former Smoker    Packs/day: 1.00    Years: 30.00    Pack years: 30.00    Types: Cigarettes    Quit date: 04/11/1995    Years since quitting: 25.2  . Smokeless tobacco: Never Used  Vaping Use  . Vaping Use: Never used  Substance Use Topics  . Alcohol use: No    Alcohol/week: 0.0 standard drinks  . Drug use: No     Colonoscopy:  PAP:  Bone density:  Lipid panel:  No Known Allergies  Current Outpatient Medications  Medication Sig Dispense Refill  . Aflibercept (EYLEA) 2 MG/0.05ML SOLN     . APPLE CIDER VINEGAR PO Take 2 tablets by mouth daily.     Marland Kitchen aspirin EC 81 MG tablet Take  81 mg by mouth daily.     . cholecalciferol (VITAMIN D) 1000 UNITS tablet Take 2,000 Units by mouth daily.     . clobetasol cream (TEMOVATE) 0.05 % Apply to more severely affected areas of rash twice a day until improved. Avoid face, groin, underarms. 120 g 3  . doxycycline (MONODOX) 100 MG capsule Take 1 capsule (100 mg total) by mouth 2 (two) times daily. Take with food. 60 capsule 5  . Dulaglutide (TRULICITY) 1.5 AU/6.3FH SOPN Inject 1.5 mg into the skin once a week. 2 mL 3  . Ferrous Gluconate 324 (37.5 Fe) MG TABS Take 324 mg by mouth 2 (two) times daily.     . furosemide (LASIX) 20 MG tablet Take 20 mg by mouth 2 (two) times daily.    . hydrOXYzine (ATARAX/VISTARIL) 10 MG tablet TAKE 1-3 TABLETS BY MOUTH EVERY NIGHT AS NEEDED FOR ITCH. 90 tablet 1  . insulin aspart (NOVOLOG FLEXPEN) 100 UNIT/ML FlexPen Inject [place] twice daily as directed per sliding scale. Max daily dose is 30 units. Dx E11.65. 15 mL 3  .  insulin detemir (LEVEMIR FLEXTOUCH) 100 UNIT/ML FlexPen Inject up to 75 units [place] daily 30 mL 3  . Lactobacillus (PROBIOTIC ACIDOPHILUS) CAPS Take 1 capsule by mouth daily.    Marland Kitchen lisinopril (ZESTRIL) 10 MG tablet Take 10 mg by mouth daily.    . Multiple Vitamin (MULTIVITAMIN) tablet Take 1 tablet by mouth daily at 12 noon.    . pantoprazole (PROTONIX) 40 MG tablet TAKE 1 TABLET(S) BY MOUTH DAILY 90 tablet 1  . pregabalin (LYRICA) 100 MG capsule Take 1 capsule (100 mg total) by mouth 3 (three) times daily. (Patient taking differently: Take 100 mg by mouth 2 (two) times daily.) 90 capsule 1  . rosuvastatin (CRESTOR) 20 MG tablet TAKE 1 TABLET BY MOUTH EVERYDAY AT BEDTIME 90 tablet 3  . tamoxifen (NOLVADEX) 20 MG tablet Take 1 tablet (20 mg total) by mouth daily. 90 tablet 3  . traMADol-acetaminophen (ULTRACET) 37.5-325 MG tablet Take 1 tablet by mouth 2 (two) times daily as needed for moderate pain. 60 tablet 2  . triamcinolone (KENALOG) 0.025 % cream Apply 1 application topically 2 (two) times  daily. 80 g 2  . triamcinolone (KENALOG) 0.1 % Apply 1 application topically 2 (two) times daily as needed (Rash). Avoid face, groin, underarms. 453.6 g 2  . verapamil (CALAN) 40 MG tablet Take 1 tablet (40 mg total) by mouth 2 (two) times daily. 180 tablet 1   No current facility-administered medications for this visit.    OBJECTIVE: There were no vitals filed for this visit.   There is no height or weight on file to calculate BMI.    ECOG FS:0 - Asymptomatic  General: Well-developed, well-nourished, no acute distress. HEENT: Normocephalic. Neuro: Alert, answering all questions appropriately. Cranial nerves grossly intact. Psych: Normal affect.  LAB RESULTS:  Lab Results  Component Value Date   NA 138 08/16/2019   K 4.2 08/16/2019   CL 97 08/16/2019   CO2 27 08/16/2019   GLUCOSE 180 (H) 08/16/2019   BUN 25 08/16/2019   CREATININE 1.51 (H) 08/16/2019   CALCIUM 9.3 08/16/2019   PROT 7.3 08/16/2019   ALBUMIN 4.0 08/16/2019   AST 27 08/16/2019   ALT 25 08/16/2019   ALKPHOS 90 08/16/2019   BILITOT 0.3 08/16/2019   GFRNONAA 34 (L) 08/16/2019   GFRAA 39 (L) 08/16/2019    Lab Results  Component Value Date   WBC 5.5 08/16/2019   NEUTROABS 14.2 (H) 03/21/2019   HGB 11.9 08/16/2019   HCT 36.3 08/16/2019   MCV 85 08/16/2019   PLT 155 08/16/2019     STUDIES: No results found.  ASSESSMENT: [name]-grade DCIS of the right breast.  PLAN:    1. [name]-grade DCIS of the right breast: Patient declined enrollment in COMET trial.  She underwent lumpectomy on January 22, 2019.  Final pathology noted close, but clear margins.  Patient initiated XRT, but has discontinued after approximately 3 weeks. She was extremely upset how she was treated by the radiation oncology provider and refused to return or continue XRT.  We previously discussed the possibility of transferring care to another facility which she also declined.  She acknowledges that although she has [name]-grade DCIS, this increases  her risk slightly of recurrence.  No further interventions are needed.  Continue tamoxifen for a total of 5 years completing in December 2025.  Her most recent mammogram in October 2021 was reported as BI-RADS 1.  Repeat in October 2022.  Patient will have video assisted telemedicine visit 1 to 2 days after  her next mammogram.   2.  Breast abscess: Resolved.  Continue follow-up with surgery as scheduled. 3.  Joint pain: Patient does not complain of this today.  Patient states she has tramadol which she takes periodically. 4.  Bullous pemphigoid: Okay to treat with next methotrexate if needed.   I provided 20 minutes of face-to-face video visit time during this encounter which included chart review, counseling, and coordination of care as documented above.   Patient expressed understanding and was in agreement with this plan. She also understands that She can call clinic at any time with any questions, concerns, or complaints.   Cancer Staging Ductal carcinoma in situ (DCIS) of right breast Staging form: Breast, AJCC 8th Edition - Clinical stage from 01/01/2019: Stage 0 (cTis (DCIS), cN0, cM0, G1, ER+, PR: Not Assessed, HER2: Not Assessed) - Signed by Lloyd Huger, MD on 01/01/2019 Stage prefix: Initial diagnosis Histologic grading system: 3 grade system   Lloyd Huger, MD   07/11/2020 2:53 PM

## 2020-07-11 ENCOUNTER — Inpatient Hospital Stay: Payer: Medicare HMO | Attending: Oncology | Admitting: Oncology

## 2020-07-11 ENCOUNTER — Encounter: Payer: Self-pay | Admitting: Oncology

## 2020-07-11 DIAGNOSIS — D0511 Intraductal carcinoma in situ of right breast: Secondary | ICD-10-CM

## 2020-07-11 NOTE — Progress Notes (Signed)
Patient scheduled for virtual visit follow up for DCIS. Patient reports she has been evaluated by dermatology for new skin issues that started in December. She has been using steroid creams and hydroxyzine as needed.

## 2020-07-25 ENCOUNTER — Other Ambulatory Visit: Payer: Self-pay | Admitting: Nurse Practitioner

## 2020-07-25 DIAGNOSIS — N184 Chronic kidney disease, stage 4 (severe): Secondary | ICD-10-CM | POA: Diagnosis not present

## 2020-07-29 ENCOUNTER — Other Ambulatory Visit: Payer: Self-pay | Admitting: Nurse Practitioner

## 2020-07-29 DIAGNOSIS — E1142 Type 2 diabetes mellitus with diabetic polyneuropathy: Secondary | ICD-10-CM

## 2020-07-29 DIAGNOSIS — E1165 Type 2 diabetes mellitus with hyperglycemia: Secondary | ICD-10-CM

## 2020-07-31 ENCOUNTER — Other Ambulatory Visit: Payer: Self-pay

## 2020-07-31 ENCOUNTER — Other Ambulatory Visit: Payer: Self-pay | Admitting: Hospice and Palliative Medicine

## 2020-07-31 ENCOUNTER — Other Ambulatory Visit: Payer: Self-pay | Admitting: Internal Medicine

## 2020-07-31 DIAGNOSIS — D631 Anemia in chronic kidney disease: Secondary | ICD-10-CM | POA: Diagnosis not present

## 2020-07-31 DIAGNOSIS — E1165 Type 2 diabetes mellitus with hyperglycemia: Secondary | ICD-10-CM

## 2020-07-31 DIAGNOSIS — R600 Localized edema: Secondary | ICD-10-CM | POA: Diagnosis not present

## 2020-07-31 DIAGNOSIS — E1122 Type 2 diabetes mellitus with diabetic chronic kidney disease: Secondary | ICD-10-CM | POA: Diagnosis not present

## 2020-07-31 DIAGNOSIS — N2581 Secondary hyperparathyroidism of renal origin: Secondary | ICD-10-CM | POA: Diagnosis not present

## 2020-07-31 DIAGNOSIS — N184 Chronic kidney disease, stage 4 (severe): Secondary | ICD-10-CM | POA: Diagnosis not present

## 2020-07-31 DIAGNOSIS — I10 Essential (primary) hypertension: Secondary | ICD-10-CM | POA: Diagnosis not present

## 2020-07-31 DIAGNOSIS — L120 Bullous pemphigoid: Secondary | ICD-10-CM

## 2020-07-31 DIAGNOSIS — E1142 Type 2 diabetes mellitus with diabetic polyneuropathy: Secondary | ICD-10-CM

## 2020-07-31 MED ORDER — LEVEMIR FLEXTOUCH 100 UNIT/ML ~~LOC~~ SOPN: PEN_INJECTOR | SUBCUTANEOUS | 3 refills | Status: DC

## 2020-07-31 MED ORDER — DOXYCYCLINE MONOHYDRATE 100 MG PO CAPS: 100.0000 mg | ORAL_CAPSULE | Freq: Two times a day (BID) | ORAL | 5 refills | Status: DC

## 2020-07-31 MED ORDER — PREGABALIN 100 MG PO CAPS: 100.0000 mg | ORAL_CAPSULE | Freq: Two times a day (BID) | ORAL | 1 refills | Status: DC

## 2020-07-31 MED ORDER — PANTOPRAZOLE SODIUM 40 MG PO TBEC: DELAYED_RELEASE_TABLET | ORAL | 1 refills | Status: DC

## 2020-08-01 DIAGNOSIS — H35352 Cystoid macular degeneration, left eye: Secondary | ICD-10-CM | POA: Diagnosis not present

## 2020-08-01 DIAGNOSIS — H34832 Tributary (branch) retinal vein occlusion, left eye, with macular edema: Secondary | ICD-10-CM | POA: Diagnosis not present

## 2020-08-08 ENCOUNTER — Ambulatory Visit: Payer: Medicare HMO | Admitting: Dermatology

## 2020-08-08 ENCOUNTER — Other Ambulatory Visit: Payer: Self-pay

## 2020-08-08 DIAGNOSIS — D692 Other nonthrombocytopenic purpura: Secondary | ICD-10-CM

## 2020-08-08 DIAGNOSIS — L120 Bullous pemphigoid: Secondary | ICD-10-CM

## 2020-08-08 NOTE — Progress Notes (Signed)
Follow-Up Visit   Subjective  Kara [name] is a 75 y.o. female who presents for the following: Follow-up (Patient here today for follow up on bullous pemphigoid. Patient states she feels areas have gotten much better since using clobetasol, hydroxyazine,. Patient checked with cancer provider and states it would be ok to start  methotrexate. Patient also reports some new itchy spots on legs, arms, torso and back. She reports areas started a week ago and starts as a blister like bump. She reports spots do not itch. ).  The following portions of the chart were reviewed this encounter and updated as appropriate:      Objective  Well appearing patient in no apparent distress; mood and affect are within normal limits.  A focused examination was performed including back, arms, legs, torso, abdomen . Relevant physical exam findings are noted in the Assessment and Plan.  Objective  arms, legs, torso: Multiple scattered pink papules on legs, some crusted, tense vesicle on right medial knee  Eroded pink patch on left abdomen with crusting Light pink patch on right abdomen Arms have few pink macules, no active lesions  Assessment & Plan  Bullous pemphigoid arms, legs, torso  Chronic autoimmune condition with duration or expected duration over one year. Condition is bothersome to patient, but overall has improved with treatment.  Do not recommend long term prednisone due to side effects (pt is diabetic)  Discussed starting methotrexate PO if current treatments stop working.  Pt defers starting at this time, since itching and rash has improved  Potential side effects of methotrexate include immunosuppression, mouth sores, GI upset including nausea, vomiting, and diarrhea, liver inflammation, liver fibrosis (more commonly seen with long term use), bone marrow suppression (decrease in white and red blood cell counts), drug interactions (do not take with sulfa-based antibiotics), and rare  pulmonary fibrosis.  It is designated pregnancy category X- do not take if pregnant or trying to get pregnant.  Avoid drinking alcohol while taking methotrexate.  Due to potential side effects, long term medication management with regular office visits and periodic blood testing is necessary while taking methotrexate.  Continue doxycyline 100 mg capsule by mouth twice daily with food  Use clobetasol cream 0.05 % to more severely affected areas body (including L abdomen)  Continue triamcinolone cream to itchy areas body qd/bid (including R abdomen)   Doxycycline should be taken with food to prevent nausea. Do not lay down for 30 minutes after taking. Be cautious with sun exposure and use good sun protection while on this medication. Pregnant women should not take this medication.   Topical steroids (such as triamcinolone, fluocinolone, fluocinonide, mometasone, clobetasol, halobetasol, betamethasone, hydrocortisone) can cause thinning and lightening of the skin if they are used for too long in the same area. Your physician has selected the right strength medicine for your problem and area affected on the body. Please use your medication only as directed by your physician to prevent side effects. Avoid applying to face, groin, and axilla. Use as directed. Risk of skin atrophy with long-term use reviewed.      Other Related Medications clobetasol cream (TEMOVATE) 0.05 % doxycycline (MONODOX) 100 MG capsule  Purpura - Chronic; persistent and recurrent.  Treatable, but not curable. - Violaceous macules and patches on right lower leg  - Benign - Related to trauma, age, sun damage and/or use of blood thinners, chronic use of topical and/or oral steroids - Observe - Can use OTC arnica containing moisturizer such as Dermend Bruise  Formula if desired - Call for worsening or other concerns   Return in about 4 months (around 12/08/2020) for bullous pemphigoid follow up.   I, Ruthell Rummage, CMA, am  acting as scribe for [name], MD.  Documentation: I have reviewed the above documentation for accuracy and completeness, and I agree with the above.  [name] MD

## 2020-08-08 NOTE — Patient Instructions (Addendum)
Continue  Triamcinolone on face, sparingly. Continue Monday-Friday to body areas as directed.  Continue Clobetasol cream to spot treat areas.  Continue Hydroxyzine 10mg  at bedtime as needed for itching  Continue Doxycycline as directed.   Can take Zyrtec (Cetirizine) in the morning.   Doxycycline should be taken with food to prevent nausea. Do not lay down for 30 minutes after taking. Be cautious with sun exposure and use good sun protection while on this medication. Pregnant women should not take this medication.   Topical steroids (such as triamcinolone, fluocinolone, fluocinonide, mometasone, clobetasol, halobetasol, betamethasone, hydrocortisone) can cause thinning and lightening of the skin if they are used for too long in the same area. Your physician has selected the right strength medicine for your problem and area affected on the body. Please use your medication only as directed by your physician to prevent side effects. Avoid applying to face, groin, and axilla. Use as directed. Risk of skin atrophy with long-term use reviewed.   If you have any questions or concerns for your doctor, please call our main line at [phone] and press option 4 to reach your doctor's medical assistant. If no one answers, please leave a voicemail as directed and we will return your call as soon as possible. Messages left after 4 pm will be answered the following business day.   You may also send [place] a message via [facility]. We typically respond to MyChart messages within 1-2 business days.  For prescription refills, please ask your pharmacy to contact our office. Our fax number is [phone].  If you have an urgent issue when the clinic is closed that cannot wait until the next business day, you can page your doctor at the number below.    Please note that while we do our best to be available for urgent issues outside of office hours, we are not available 24/7.   If you have an urgent issue and are  unable to reach [place], you may choose to seek medical care at your doctor's office, retail clinic, urgent care center, or emergency room.  If you have a medical emergency, please immediately call 911 or go to the emergency department.  Pager Numbers  - Dr. Nehemiah Massed: [phone]  - Dr. Laurence Ferrari: [phone]  - Dr. Nicole Kindred: [phone]  In the event of inclement weather, please call our main line at [phone] for an update on the status of any delays or closures.  Dermatology Medication Tips: Please keep the boxes that topical medications come in in order to help keep track of the instructions about where and how to use these. Pharmacies typically print the medication instructions only on the boxes and not directly on the medication tubes.   If your medication is too expensive, please contact our office at [phone] option 4 or send [place] a message through [place].   We are unable to tell what your co-pay for medications will be in advance as this is different depending on your insurance coverage. However, we may be able to find a substitute medication at lower cost or fill out paperwork to get insurance to cover a needed medication.   If a prior authorization is required to get your medication covered by your insurance company, please allow [place] 1-2 business days to complete this process.  Drug prices often vary depending on where the prescription is filled and some pharmacies may offer cheaper prices.  The website www.goodrx.com contains coupons for medications through different pharmacies. The prices here do not account for what  the cost may be with help from insurance (it may be cheaper with your insurance), but the website can give you the price if you did not use any insurance.  - You can print the associated coupon and take it with your prescription to the pharmacy.  - You may also stop by our office during regular business hours and pick up a GoodRx coupon card.  - If you need your  prescription sent electronically to a different pharmacy, notify our office through [facility] or by phone at [phone] option 4.

## 2020-08-16 DIAGNOSIS — H26491 Other secondary cataract, right eye: Secondary | ICD-10-CM | POA: Diagnosis not present

## 2020-09-03 ENCOUNTER — Other Ambulatory Visit: Payer: Self-pay | Admitting: Internal Medicine

## 2020-09-05 ENCOUNTER — Other Ambulatory Visit: Payer: Self-pay | Admitting: Dermatology

## 2020-09-05 DIAGNOSIS — R21 Rash and other nonspecific skin eruption: Secondary | ICD-10-CM

## 2020-09-06 ENCOUNTER — Other Ambulatory Visit: Payer: Self-pay | Admitting: Internal Medicine

## 2020-09-06 DIAGNOSIS — M159 Polyosteoarthritis, unspecified: Secondary | ICD-10-CM

## 2020-09-19 DIAGNOSIS — H34832 Tributary (branch) retinal vein occlusion, left eye, with macular edema: Secondary | ICD-10-CM | POA: Diagnosis not present

## 2020-10-02 ENCOUNTER — Telehealth: Payer: Self-pay | Admitting: Internal Medicine

## 2020-10-02 ENCOUNTER — Telehealth: Payer: Self-pay | Admitting: Pharmacist

## 2020-10-02 ENCOUNTER — Other Ambulatory Visit: Payer: Self-pay

## 2020-10-02 ENCOUNTER — Telehealth: Payer: Self-pay

## 2020-10-02 DIAGNOSIS — E1142 Type 2 diabetes mellitus with diabetic polyneuropathy: Secondary | ICD-10-CM

## 2020-10-02 MED ORDER — PREGABALIN 100 MG PO CAPS: 100.0000 mg | ORAL_CAPSULE | Freq: Two times a day (BID) | ORAL | 0 refills | Status: DC

## 2020-10-02 MED ORDER — PREGABALIN 100 MG PO CAPS: 100.0000 mg | ORAL_CAPSULE | Freq: Two times a day (BID) | ORAL | 1 refills | Status: DC

## 2020-10-02 NOTE — Telephone Encounter (Signed)
Pt advised we called in phar lyrica 100 mg twice a day 60 no refills

## 2020-10-02 NOTE — Chronic Care Management (AMB) (Signed)
  Chronic Care Management   Note  10/02/2020 Name: Kara [name] MRN: [id number] DOB: 13-Jul-1945  Kara [name] is a 75 y.o. year old female who is a primary care patient of Lavera Guise, MD. I reached out to [name] by phone today in response to a referral sent by Ms. Sherilyn Banker Ketelsen's PCP, Lavera Guise, MD.   Ms. Strausser was given information about Chronic Care Management services today including:  CCM service includes personalized support from designated clinical staff supervised by her physician, including individualized plan of care and coordination with other care providers 24/7 contact phone numbers for assistance for urgent and routine care needs. Service will only be billed when office clinical staff spend 20 minutes or more in a month to coordinate care. Only one practitioner may furnish and bill the service in a calendar month. The patient may stop CCM services at any time (effective at the end of the month) by phone call to the office staff.   Patient agreed to services and verbal consent obtained.   Follow up plan:   Tatjana Secretary/administrator

## 2020-10-02 NOTE — Progress Notes (Signed)
Chronic Care Management Pharmacy Note  10/03/2020 Name:  Kara [name] MRN:  [id number] DOB:  [birth year]  Summary: Initial PharmD visit.  Patient reports some low fasting blood sugars in the high 50s to 60s range.  She takes glucose tablets to fix.  She has physical next week and is due for A1c recheck.  Also am helping patient apply for PAP for her Trulicity as she reports high copay.  Recommendations/Changes made from today's visit: Recheck A1c - if it continues to decrease below 6.7, would recommend to decrease basal insulin to at least 60 units daily  Plan: F/u 3 months with PharmD Verdene Lennert to follow up on glucose next month   Subjective: Kara [name] is an 75 y.o. year old female who is a primary patient of Humphrey Rolls, Timoteo Gaul, MD.  The CCM team was consulted for assistance with disease management and care coordination needs.    Engaged with patient by telephone for initial visit in response to provider referral for pharmacy case management and/or care coordination services.   Consent to Services:  The patient was given the following information about Chronic Care Management services today, agreed to services, and gave verbal consent: 1. CCM service includes personalized support from designated clinical staff supervised by the primary care provider, including individualized plan of care and coordination with other care providers 2. 24/7 contact phone numbers for assistance for urgent and routine care needs. 3. Service will only be billed when office clinical staff spend 20 minutes or more in a month to coordinate care. 4. Only one practitioner may furnish and bill the service in a calendar month. 5.The patient may stop CCM services at any time (effective at the end of the month) by phone call to the office staff. 6. The patient will be responsible for cost sharing (co-pay) of up to 20% of the service fee (after annual deductible is met). Patient agreed to services and consent  obtained.  Patient Care Team: Lavera Guise, MD as PCP - General (Internal Medicine) Lloyd Huger, MD as Consulting Physician (Oncology) Herbert Pun, MD as Consulting Physician (General Surgery) Noreene Filbert, MD as Referring Physician (Radiation Oncology) Edythe Clarity, [facility] as Pharmacist (Pharmacist) Recent office visits:  07/03/20 Luiz Ochoa, NP. For follow-up. No medication changes. 04/20/20 Luiz Ochoa, NP. For follow up. STARTED Prednisone 10 mg dose pack.   Recent consult visits: 08/08/20 Jovita Kussmaul, MD. For bullous pemphigoid. Per note: Can take Zyrtec (Cetirizine) in the morning and Can use OTC arnica containing moisturizer such as Dermend Bruise Formula if desired. 08/01/20 Ophthalmology Isaias Sakai. No information given. 07/31/20 Nephrology Murlean Iba, MD For follow-up. No information given. 07/11/20 Oncology Lloyd Huger, MD. For follow-up. No mediation changes. 06/28/20 Jovita Kussmaul, MD. For bullous pemphigoid. Per note:Start OTC Zyrtec (Cetirizine) 10 mg PO in the morning for itch. 05/29/20 Jovita Kussmaul, MD. For bullous pemphigoid. STARTED Doxycycline 100 mg 2 times daily 05/22/20 Jovita Kussmaul, MD. For rash STARTED Triamcinolone 0.1% cream 2 times daily. 05/16/20 Ophthalmology Isaias Sakai. No information given. 05/15/20 Jovita Kussmaul, MD.For psoriasis. STARTED Clobetasol Propionate 0.05% apply dally and Hyfroxyzine 10 mg 1-3 tablets at night as needed. 04/04/20 Nephrology Murlean Iba, MD For follow-up. No medication Changes.   Hospital visits:  None in previous 6 months   Medication History: Rosuvastatin 20 mg 90 DS 07/27/20 Lisinopril 10 mg 30 DS 07/13/20    Objective:  Lab Results  Component Value Date   CREATININE  1.51 (H) 08/16/2019   BUN 25 08/16/2019   GFRNONAA 34 (L) 08/16/2019   GFRAA 39 (L) 08/16/2019   NA 138 08/16/2019   K 4.2 08/16/2019   CALCIUM 9.3 08/16/2019   CO2 27 08/16/2019    GLUCOSE 180 (H) 08/16/2019    Lab Results  Component Value Date/Time   HGBA1C 6.7 (A) 07/03/2020 11:02 AM   HGBA1C 7.4 (A) 03/06/2020 09:23 AM   HGBA1C 7.6 (H) 03/21/2019 08:12 PM    Last diabetic Eye exam: No results found for: HMDIABEYEEXA  Last diabetic Foot exam: No results found for: HMDIABFOOTEX   Lab Results  Component Value Date   CHOL 170 08/16/2019   HDL 44 08/16/2019   LDLCALC 72 08/16/2019   TRIG 340 (H) 08/16/2019    Hepatic Function Latest Ref Rng & Units 08/16/2019 03/21/2019 07/23/2011  Total Protein 6.0 - 8.5 g/dL 7.3 7.7 7.7  Albumin 3.7 - 4.7 g/dL 4.0 3.8 3.5  AST 0 - 40 IU/L _0 ALT 0 - 32 IU/L _1 Alk Phosphatase 39 - 117 IU/L 90 81 85  Total Bilirubin 0.0 - 1.2 mg/dL 0.3 1.3(H) 0.4    Lab Results  Component Value Date/Time   TSH 1.750 08/16/2019 10:35 AM   FREET4 1.35 08/16/2019 10:35 AM    CBC Latest Ref Rng & Units 08/16/2019 03/24/2019 03/23/2019  WBC 3.4 - 10.8 x10E3/uL 5.5 11.2(H) 15.4(H)  Hemoglobin 11.1 - 15.9 g/dL 11.9 9.1(L) 9.2(L)  Hematocrit 34.0 - 46.6 % 36.3 27.7(L) 29.9(L)  Platelets 150 - 450 x10E3/uL 155 134(L) 125(L)    Lab Results  Component Value Date/Time   VD25OH 37.7 08/16/2019 10:35 AM    Clinical ASCVD: No  The 10-year ASCVD risk score Mikey Bussing DC Jr., et al., 2013) is: 36.1%   Values used to calculate the score:     Age: 80 years     Sex: Female     Is Non-Hispanic African American: No     Diabetic: Yes     Tobacco smoker: No     Systolic Blood Pressure: 182 mmHg     Is BP treated: Yes     HDL Cholesterol: 44 mg/dL     Total Cholesterol: 170 mg/dL    Depression screen [facility] 2/9 07/03/2020 03/06/2020 12/02/2019  Decreased Interest 0 0 0  Down, Depressed, Hopeless 0 0 0  PHQ - 2 Score 0 0 0      Social History   Tobacco Use  Smoking Status Former   Packs/day: 1.00   Years: 30.00   Pack years: 30.00   Types: Cigarettes   Quit date: 04/11/1995   Years since quitting: 25.4  Smokeless Tobacco Never    BP Readings from Last 3 Encounters:  07/03/20 128/82  04/20/20 (!) 150/70  03/06/20 132/80   Pulse Readings from Last 3 Encounters:  07/03/20 62  04/20/20 75  03/06/20 66   Wt Readings from Last 3 Encounters:  07/03/20 207 lb 12.8 oz (94.3 kg)  04/20/20 213 lb 9.6 oz (96.9 kg)  03/06/20 212 lb (96.2 kg)   BMI Readings from Last 3 Encounters:  07/03/20 35.67 kg/m  04/20/20 36.66 kg/m  03/06/20 36.39 kg/m    Assessment/Interventions: Review of patient past medical history, allergies, medications, health status, including review of consultants reports, laboratory and other test data, was performed as part of comprehensive evaluation and provision of chronic care management services.   SDOH:  (Social Determinants of Health) assessments and interventions performed: Yes  Financial Resource Strain: Low Risk    Difficulty of Paying Living Expenses: Not very hard    SDOH Screenings   Alcohol Screen: Low Risk    Last Alcohol Screening Score (AUDIT): 0  Depression (PHQ2-9): Low Risk    PHQ-2 Score: 0  Financial Resource Strain: Low Risk    Difficulty of Paying Living Expenses: Not very hard  Food Insecurity: Not on file  Housing: Not on file  Physical Activity: Not on file  Social Connections: Not on file  Stress: Not on file  Tobacco Use: Medium Risk   Smoking Tobacco Use: Former   Smokeless Tobacco Use: Never  Transportation Needs: Not on file    [place]  No Known Allergies  Medications Reviewed Today     Reviewed by Edythe Clarity, [place] (Pharmacist) on 10/03/20 at 1426  Med List Status: <None>   Medication Order Taking? Sig Documenting Provider Last Dose Status Informant  Aflibercept (EYLEA) 2 MG/0.05ML SOLN [id number] Yes  [provider] Taking Active   APPLE CIDER VINEGAR PO [id number] Yes Take 2 tablets by mouth daily.  [provider] Taking Active Spouse/Significant Other  aspirin EC 81 MG tablet [id number] Yes Take 81 mg by  mouth daily.  [provider] Taking Active Spouse/Significant Other  cholecalciferol (VITAMIN D) 1000 UNITS tablet [id number] Yes Take 2,000 Units by mouth daily.  [provider] Taking Active Spouse/Significant Other  clobetasol cream (TEMOVATE) 0.05 % [id number] Yes Apply to more severely affected areas of rash twice a day until improved. Avoid face, groin, underarms. [name], MD Taking Active   doxycycline (MONODOX) 100 MG capsule [id number] Yes Take 1 capsule (100 mg total) by mouth 2 (two) times daily. Take with food. Luiz Ochoa, NP Taking Active   Ferrous Gluconate 324 (37.5 Fe) MG TABS [id number] Yes Take 324 mg by mouth 2 (two) times daily.  [provider] Taking Active Spouse/Significant Other  furosemide (LASIX) 20 MG tablet [id number] Yes Take 20 mg by mouth 2 (two) times daily. [provider] Taking Active   hydrOXYzine (ATARAX/VISTARIL) 10 MG tablet [id number] No TAKE 1-3 TABLETS BY MOUTH EVERY NIGHT AS NEEDED FOR ITCH.  Patient not taking: Reported on 10/03/2020   [name], MD Not Taking Active   insulin aspart (NOVOLOG FLEXPEN) 100 UNIT/ML FlexPen [id number] Yes Inject [place] twice daily as directed per sliding scale. Max daily dose is 30 units. Dx E11.65. Ronnell Freshwater, NP Taking Active   insulin detemir (LEVEMIR FLEXTOUCH) 100 UNIT/ML FlexPen [id number] Yes INJECT UP TO 75 UNITS UNDER THE SKIN DAILY Luiz Ochoa, NP Taking Active   Lactobacillus (PROBIOTIC ACIDOPHILUS) CAPS [id number] Yes Take 1 capsule by mouth daily. [provider] Taking Active   lisinopril (ZESTRIL) 10 MG tablet [id number] Yes Take 10 mg by mouth daily. [provider] Taking Active   Multiple Vitamin (MULTIVITAMIN) tablet [id number] Yes Take 1 tablet by mouth daily at 12 noon. [provider] Taking Active Spouse/Significant Other  pantoprazole (PROTONIX) 40 MG tablet [id number] Yes TAKE 1 TABLET(S) BY MOUTH DAILY Luiz Ochoa, NP  Taking Active   pregabalin (LYRICA) 100 MG capsule [id number] Yes Take 1 capsule (100 mg total) by mouth 2 (two) times daily. Lavera Guise, MD Taking Active   rosuvastatin (CRESTOR) 20 MG tablet [id number] Yes TAKE 1 TABLET BY MOUTH EVERYDAY AT BEDTIME Lavera Guise, MD Taking Active   tamoxifen (NOLVADEX) 20 MG tablet [id number] Yes Take 1 tablet (20 mg total) by mouth daily.  Lloyd Huger, MD Taking Active   traMADol-acetaminophen (ULTRACET) 37.5-325 MG tablet [id number] Yes TAKE 1 TABLET BY MOUTH 2 (TWO) TIMES DAILY AS NEEDED FOR MODERATE PAIN. Lavera Guise, MD Taking Active   triamcinolone (KENALOG) 0.025 % cream [id number] Yes Apply 1 application topically 2 (two) times daily. Ronnell Freshwater, NP Taking Active   triamcinolone (KENALOG) 0.1 % [id number] Yes Apply 1 application topically 2 (two) times daily as needed (Rash). Avoid face, groin, underarms. [name], MD Taking Active   TRULICITY 1.5 VZ/8.5YI [place] Aid [id number] Yes INJECT 1.5 MG INTO THE SKIN ONCE A WEEK. Lavera Guise, MD Taking Active   verapamil (CALAN) 40 MG tablet [id number] Yes Take 1 tablet (40 mg total) by mouth 2 (two) times daily. Luiz Ochoa, NP Taking Active             Patient Active Problem List   Diagnosis Date Noted   Synovial cyst of right popliteal space 12/18/2019   Encounter for general adult medical examination with abnormal findings 10/17/2019   Bilateral lower extremity edema 10/17/2019   Atopic dermatitis 10/17/2019   Chest pain 08/16/2019   Alopecia 08/16/2019   Fever 03/22/2019   Cellulitis of right breast 03/22/2019   CKD (chronic kidney disease), stage IV ([place]) 03/22/2019   Aortic atherosclerosis ([place]) 03/22/2019   GERD (gastroesophageal reflux disease) 03/21/2019   Rash 03/21/2019   Type 2 diabetes mellitus with hyperglycemia ([place]) 01/07/2019   Ductal carcinoma in situ (DCIS) of right breast 01/01/2019   Screening for breast cancer 10/03/2018   Dyspnea on exertion 10/03/2018    Dysuria 10/03/2018   Type 2 diabetes mellitus with peripheral neuropathy ([place]) 06/29/2018   Type 2 diabetes mellitus with diabetic neuropathy ([place]) 06/29/2018   Uncontrolled type 2 diabetes mellitus with hyperglycemia ([place]) 03/30/2018   Inflammatory polyarthropathy ([place]) 03/30/2018   Benign essential tremor 03/30/2018   Irritable bowel syndrome with diarrhea 12/07/2017   Essential hypertension 12/07/2017   Mixed hyperlipidemia 12/07/2017    Immunization History  Administered Date(s) Administered   Influenza, High Dose Seasonal PF 01/24/2017, 02/02/2018, 12/18/2018, 01/07/2020   Influenza-Unspecified 01/20/2014   Pneumococcal Polysaccharide-23 02/02/2018, 05/04/2018    Conditions to be addressed/monitored:  HTN, GERD, Type II DM w/ CKD, HLD, Breast Cancer  Care Plan : General Pharmacy (Adult)  Updates made by Edythe Clarity, RPH since 10/03/2020 12:00 AM     Problem: HTN, GERD, Type II DM w/ CKD, HLD, Breast Cancer   Priority: High  Onset Date: 10/03/2020     Long-Range Goal: Patient-Specific Goal   Start Date: 10/03/2020  Expected End Date: 04/04/2021  This Visit's Progress: On track  Priority: High  Note:   Current Barriers:  Unable to achieve control of fasting glucose   Pharmacist Clinical Goal(s):  Patient will achieve adherence to monitoring guidelines and medication adherence to achieve therapeutic efficacy achieve control of fasting glucose as evidenced by monitoring contact provider office for questions/concerns as evidenced notation of same in electronic health record through collaboration with PharmD and provider.   Interventions: 1:1 collaboration with Lavera Guise, MD regarding development and update of comprehensive plan of care as evidenced by provider attestation and co-signature Inter-disciplinary care team collaboration (see longitudinal plan of care) Comprehensive medication review performed; medication list updated in electronic medical  record  Hypertension (BP goal <130/80) -Controlled -Current treatment: Verapamil 81m daily Lisinopril 129mdaily -Medications previously tried: hydralazine 1061mID  -Current home readings: 130/80 - [facility] -Current dietary habits:  tries to watch what she eats, eats chicken/fish, stir fry  -Reports hypotensive/hypertensive symptoms including dizziness and fatigue -Educated on BP goals and benefits of medications for prevention of heart attack, stroke and kidney damage; Daily salt intake goal < 2300 mg; Exercise goal of 150 minutes per week; Importance of home blood pressure monitoring; Symptoms of hypotension and importance of maintaining adequate hydration; -Counseled to monitor BP at home a few times per week, document, and provide log at future appointments -Recommended to continue current medication  Hyperlipidemia: (LDL goal < 70) -Not ideally controlled -Current treatment: Rosuvastatin 82m daily -Medications previously tried: none noted  -Current dietary patterns: see above -Educated on Cholesterol goals;  Benefits of statin for ASCVD risk reduction; Importance of limiting foods high in cholesterol; Exercise goal of 150 minutes per week; -Recommended to continue current medication Recommended repeat lipid panel at next visit which is a physical with Alyssa NP  Diabetes w/neuropathy (A1c goal <7%) -Controlled -Current medications: Novolog twice daily via sliding scale with max daily dose of 15 units Levemir 75 units daily Trulicity 14.0JWonce weekly - Mondays Lyrica 1078mtwice daily -Medications previously tried: glimepiride  -Current home glucose readings fasting glucose: 106, 113, 66,59 post prandial glucose: 202, 157 -Reports hypoglycemic/hyperglycemic symptoms some mornings when she is having the 50-60s blood sugars where she gets shaky and cannot get out of bed.  She takes glucose tablets and this usually resolves. -Current exercise:  minimal -Educated on A1c and blood sugar goals; Exercise goal of 150 minutes per week; Prevention and management of hypoglycemic episodes; Benefits of routine self-monitoring of blood sugar; -Counseled to check feet daily and get yearly eye exams -Counseled on diet and exercise extensively Recommended to continue current medication Recommended recheck A1c at upcoming physical and to bring in glucose log.  If she continues to have low blood sugars, I would consider decreasing her basal insulin dose to 60 units hs and titrate down based on glucose readings.    Also assessed patient finances and she reports high copay on Trulicity.  Based on reported income she would qualify for patient assistance.  Will have application mailed to her to bring in once she has signed required portion.  GERD (Goal: Minimize symptoms) -Controlled -Current treatment  Pantoprazole 4083maily -Medications previously tried: none noted -Denies symptoms, continues to work on triggers  -Recommended to continue current medication   Patient Goals/Self-Care Activities Patient will:  - take medications as prescribed check glucose daily, document, and provide at future appointments check blood pressure a few times per week, document, and provide at future appointments collaborate with provider on medication access solutions  Follow Up Plan: The care management team will reach out to the patient again over the next 90 days.       Medication Assistance: Application for Trulicity  medication assistance program. in process.  Anticipated assistance start date unknown.  See plan of care for additional detail.  Compliance/Adherence/Medication fill history: Care Gaps: Eye exam needed  Star-Rating Drugs: Rosuvastatin 20 mg 90 DS 07/27/20 Lisinopril 10 mg 30 DS 07/13/20  Patient[facility]  [id number] [place] [id number]:  [phone]: [phone] pill box? Yes Pt endorses 100% compliance  We discussed: Benefits of medication synchronization, packaging and delivery as well as enhanced pharmacist oversight with [employer]. Patient decided to: Continue current medication management strategy  Care Plan and Follow Up Patient Decision:  Patient  agrees to Care Plan and Follow-up.  Plan: The care management team will reach out to the patient again over the next 90 days.  Beverly Milch, PharmD Clinical Pharmacist [facility] [phone]

## 2020-10-02 NOTE — Progress Notes (Addendum)
Chronic Care Management Pharmacy Assistant   Name: Kara [name]  MRN: [id number] DOB: 1945-07-26  Kara [name] is an 75 y.o. year old female who presents for his initial CCM visit with the clinical pharmacist.  Reason for Encounter: Chart Prep    Conditions to be addressed/monitored: HTN, GERD, Type 2 DM, CKD, HLD.  Primary concerns for visit include: HTN  Recent office visits:  07/03/20 Kara Ochoa, NP. For follow-up. No medication changes.  04/20/20 Kara Ochoa, NP. For follow up. STARTED Prednisone 10 mg dose pack.  Recent consult visits: 08/08/20 Kara Kussmaul, MD. For bullous pemphigoid. Per note: Can take Zyrtec (Cetirizine) in the morning and Can use OTC arnica containing moisturizer such as Dermend Bruise Formula if desired. 08/01/20 Ophthalmology Kara [name]. No information given. 07/31/20 Nephrology Kara Iba, MD For follow-up. No information given. 07/11/20 Oncology Kara Huger, MD. For follow-up. No mediation changes. 06/28/20 Kara Kussmaul, MD. For bullous pemphigoid. Per note:Start OTC Zyrtec (Cetirizine) 10 mg PO in the morning for itch. 05/29/20 Kara Kussmaul, MD. For bullous pemphigoid. STARTED Doxycycline 100 mg 2 times daily 05/22/20 Kara Kussmaul, MD. For rash STARTED Triamcinolone 0.1% cream 2 times daily. 05/16/20 Ophthalmology Kara [name]. No information given. 05/15/20 Kara Kussmaul, MD.For psoriasis. STARTED Clobetasol Propionate 0.05% apply dally and Hyfroxyzine 10 mg 1-3 tablets at night as needed.  04/04/20 Nephrology Kara Iba, MD For follow-up. No medication Changes.  Hospital visits:  None in previous 6 months  Medication History: Rosuvastatin 20 mg 90 DS 07/27/20 Lisinopril 10 mg 30 DS 07/13/20  Medications: Outpatient Encounter Medications as of 10/02/2020  Medication Sig   Aflibercept (EYLEA) 2 MG/0.05ML SOLN    APPLE CIDER VINEGAR PO Take 2 tablets by mouth daily.    aspirin EC 81 MG tablet Take  81 mg by mouth daily.    cholecalciferol (VITAMIN D) 1000 UNITS tablet Take 2,000 Units by mouth daily.    clobetasol cream (TEMOVATE) 0.05 % Apply to more severely affected areas of rash twice a day until improved. Avoid face, groin, underarms.   doxycycline (MONODOX) 100 MG capsule Take 1 capsule (100 mg total) by mouth 2 (two) times daily. Take with food.   Ferrous Gluconate 324 (37.5 Fe) MG TABS Take 324 mg by mouth 2 (two) times daily.    furosemide (LASIX) 20 MG tablet Take 20 mg by mouth 2 (two) times daily.   hydrOXYzine (ATARAX/VISTARIL) 10 MG tablet TAKE 1-3 TABLETS BY MOUTH EVERY NIGHT AS NEEDED FOR ITCH.   insulin aspart (NOVOLOG FLEXPEN) 100 UNIT/ML FlexPen Inject [place] twice daily as directed per sliding scale. Max daily dose is 30 units. Dx E11.65.   insulin detemir (LEVEMIR FLEXTOUCH) 100 UNIT/ML FlexPen INJECT UP TO 75 UNITS UNDER THE SKIN DAILY   Lactobacillus (PROBIOTIC ACIDOPHILUS) CAPS Take 1 capsule by mouth daily.   lisinopril (ZESTRIL) 10 MG tablet Take 10 mg by mouth daily.   Multiple Vitamin (MULTIVITAMIN) tablet Take 1 tablet by mouth daily at 12 noon.   pantoprazole (PROTONIX) 40 MG tablet TAKE 1 TABLET(S) BY MOUTH DAILY   pregabalin (LYRICA) 100 MG capsule Take 1 capsule (100 mg total) by mouth 2 (two) times daily.   rosuvastatin (CRESTOR) 20 MG tablet TAKE 1 TABLET BY MOUTH EVERYDAY AT BEDTIME   tamoxifen (NOLVADEX) 20 MG tablet Take 1 tablet (20 mg total) by mouth daily.   traMADol-acetaminophen (ULTRACET) 37.5-325 MG tablet TAKE 1 TABLET BY MOUTH 2 (TWO) TIMES DAILY AS NEEDED  FOR MODERATE PAIN.   triamcinolone (KENALOG) 0.025 % cream Apply 1 application topically 2 (two) times daily.   triamcinolone (KENALOG) 0.1 % Apply 1 application topically 2 (two) times daily as needed (Rash). Avoid face, groin, underarms.   TRULICITY 1.5 VP/7.1GG SOPN INJECT 1.5 MG INTO THE SKIN ONCE A WEEK.   verapamil (CALAN) 40 MG tablet Take 1 tablet (40 mg total) by mouth 2 (two) times  daily.   No facility-administered encounter medications on file as of 10/02/2020.    Have you seen any other providers since your last visit? Patient stated no.  Any changes in your medications or health? Patient stated no.  Any side effects from any medications? Patient stated no.  Do you have an symptoms or problems not managed by your medications? Patient stated no.  Any concerns about your health right now? Patient stated she has a rash that wont go away.  Has your provider asked that you check blood pressure, blood sugar, or follow special diet at home? Patient stated she checks her blood sugar 3 times a day.  Do you get any type of exercise on a regular basis? Patient stated no, she does walk sometimes.  Can you think of a goal you would like to reach for your health? Patient stated no.  Do you have any problems getting your medications? Patient stated no.  Is there anything that you would like to discuss during the appointment? Patient stated no.  Please bring medications and supplements to appointment, patient reminded of her OTP appointment on 10/03/20 at 130 pm.   Follow-Up:Pharmacist Review   Kara [name], [place] Pharmacist Assistant [phone]

## 2020-10-03 ENCOUNTER — Ambulatory Visit: Payer: Medicare HMO | Admitting: Pharmacist

## 2020-10-03 ENCOUNTER — Telehealth: Payer: Self-pay | Admitting: Pharmacist

## 2020-10-03 DIAGNOSIS — K219 Gastro-esophageal reflux disease without esophagitis: Secondary | ICD-10-CM

## 2020-10-03 DIAGNOSIS — I10 Essential (primary) hypertension: Secondary | ICD-10-CM

## 2020-10-03 DIAGNOSIS — N184 Chronic kidney disease, stage 4 (severe): Secondary | ICD-10-CM

## 2020-10-03 DIAGNOSIS — E1142 Type 2 diabetes mellitus with diabetic polyneuropathy: Secondary | ICD-10-CM

## 2020-10-03 DIAGNOSIS — E782 Mixed hyperlipidemia: Secondary | ICD-10-CM

## 2020-10-03 NOTE — Progress Notes (Addendum)
    Chronic Care Management Pharmacy Assistant   Name: ETHA STAMBAUGH  MRN: [id number] DOB: [birth year]  Reason for Encounter: PAP  Medications: Outpatient Encounter Medications as of 10/03/2020  Medication Sig   Aflibercept (EYLEA) 2 MG/0.05ML SOLN    APPLE CIDER VINEGAR PO Take 2 tablets by mouth daily.    aspirin EC 81 MG tablet Take 81 mg by mouth daily.    cholecalciferol (VITAMIN D) 1000 UNITS tablet Take 2,000 Units by mouth daily.    clobetasol cream (TEMOVATE) 0.05 % Apply to more severely affected areas of rash twice a day until improved. Avoid face, groin, underarms.   doxycycline (MONODOX) 100 MG capsule Take 1 capsule (100 mg total) by mouth 2 (two) times daily. Take with food.   Ferrous Gluconate 324 (37.5 Fe) MG TABS Take 324 mg by mouth 2 (two) times daily.    furosemide (LASIX) 20 MG tablet Take 20 mg by mouth 2 (two) times daily.   hydrOXYzine (ATARAX/VISTARIL) 10 MG tablet TAKE 1-3 TABLETS BY MOUTH EVERY NIGHT AS NEEDED FOR ITCH. (Patient not taking: Reported on 10/03/2020)   insulin aspart (NOVOLOG FLEXPEN) 100 UNIT/ML FlexPen Inject [place] twice daily as directed per sliding scale. Max daily dose is 30 units. Dx E11.65.   insulin detemir (LEVEMIR FLEXTOUCH) 100 UNIT/ML FlexPen INJECT UP TO 75 UNITS UNDER THE SKIN DAILY   Lactobacillus (PROBIOTIC ACIDOPHILUS) CAPS Take 1 capsule by mouth daily.   lisinopril (ZESTRIL) 10 MG tablet Take 10 mg by mouth daily.   Multiple Vitamin (MULTIVITAMIN) tablet Take 1 tablet by mouth daily at 12 noon.   pantoprazole (PROTONIX) 40 MG tablet TAKE 1 TABLET(S) BY MOUTH DAILY   pregabalin (LYRICA) 100 MG capsule Take 1 capsule (100 mg total) by mouth 2 (two) times daily.   rosuvastatin (CRESTOR) 20 MG tablet TAKE 1 TABLET BY MOUTH EVERYDAY AT BEDTIME   tamoxifen (NOLVADEX) 20 MG tablet Take 1 tablet (20 mg total) by mouth daily.   traMADol-acetaminophen (ULTRACET) 37.5-325 MG tablet TAKE 1 TABLET BY MOUTH 2 (TWO) TIMES DAILY AS NEEDED FOR  MODERATE PAIN.   triamcinolone (KENALOG) 0.025 % cream Apply 1 application topically 2 (two) times daily.   triamcinolone (KENALOG) 0.1 % Apply 1 application topically 2 (two) times daily as needed (Rash). Avoid face, groin, underarms.   TRULICITY 1.5 WC/3.7SE SOPN INJECT 1.5 MG INTO THE SKIN ONCE A WEEK.   verapamil (CALAN) 40 MG tablet Take 1 tablet (40 mg total) by mouth 2 (two) times daily.   No facility-administered encounter medications on file as of 10/03/2020.   New patient assistance application form filled out to [employer] for [employer]. Waiting for patient and provider to complete and sign documentation. Called patient to inquire if they wanted the application mailed to them or if they wanted to come into the office. Patient is required to sign application and to bring/have proof of income. She stated she would be willing to come into office to bring proof of income and sign application once the paperwork comes in the mail she would like it mailed to their residence address [place] [address]  Follow-Up:Pharmacist Review  Veronica Mclemore, [place] Pharmacist Assistant [phone]

## 2020-10-03 NOTE — Patient Instructions (Addendum)
Visit Information   Goals Addressed             This Visit's Progress    Track and Manage My Blood Pressure-Hypertension       Timeframe:  Long-Range Goal Priority:  High Start Date:   10/03/20                          Expected End Date:    04/04/21                   Follow Up Date 01/19/21    - check blood pressure 3 times per week - choose a place to take my blood pressure (home, clinic or office, retail store) - write blood pressure results in a log or diary    Why is this important?   You won't feel high blood pressure, but it can still hurt your blood vessels.  High blood pressure can cause heart or kidney problems. It can also cause a stroke.  Making lifestyle changes like losing a little weight or eating less salt will help.  Checking your blood pressure at home and at different times of the day can help to control blood pressure.  If the doctor prescribes medicine remember to take it the way the doctor ordered.  Call the office if you cannot afford the medicine or if there are questions about it.     Notes:         Patient Care Plan: General Pharmacy (Adult)     Problem Identified: HTN, GERD, Type II DM w/ CKD, HLD, Breast Cancer   Priority: High  Onset Date: 10/03/2020     Long-Range Goal: Patient-Specific Goal   Start Date: 10/03/2020  Expected End Date: 04/04/2021  This Visit's Progress: On track  Priority: High  Note:   Current Barriers:  Unable to achieve control of fasting glucose   Pharmacist Clinical Goal(s):  Patient will achieve adherence to monitoring guidelines and medication adherence to achieve therapeutic efficacy achieve control of fasting glucose as evidenced by monitoring contact provider office for questions/concerns as evidenced notation of same in electronic health record through collaboration with PharmD and provider.   Interventions: 1:1 collaboration with Lavera Guise, MD regarding development and update of comprehensive plan of  care as evidenced by provider attestation and co-signature Inter-disciplinary care team collaboration (see longitudinal plan of care) Comprehensive medication review performed; medication list updated in electronic medical record  Hypertension (BP goal <130/80) -Controlled -Current treatment: Verapamil 40mg  daily Lisinopril 10mg  daily -Medications previously tried: hydralazine 10mg  TID  -Current home readings: 130/80 - at [facility] no logs present -Current dietary habits: tries to watch what she eats, eats chicken/fish, stir fry  -Reports hypotensive/hypertensive symptoms including dizziness and fatigue -Educated on BP goals and benefits of medications for prevention of heart attack, stroke and kidney damage; Daily salt intake goal < 2300 mg; Exercise goal of 150 minutes per week; Importance of home blood pressure monitoring; Symptoms of hypotension and importance of maintaining adequate hydration; -Counseled to monitor BP at home a few times per week, document, and provide log at future appointments -Recommended to continue current medication  Hyperlipidemia: (LDL goal < 70) -Not ideally controlled -Current treatment: Rosuvastatin 20mg  daily -Medications previously tried: none noted  -Current dietary patterns: see above -Educated on Cholesterol goals;  Benefits of statin for ASCVD risk reduction; Importance of limiting foods high in cholesterol; Exercise goal of 150 minutes per week; -Recommended to continue current  medication Recommended repeat lipid panel at next visit which is a physical with Alyssa NP  Diabetes w/neuropathy (A1c goal <7%) -Controlled -Current medications: Novolog twice daily via sliding scale with max daily dose of 15 units Levemir 75 units daily Trulicity 1.5mg  once weekly - Mondays Lyrica 100mg  twice daily -Medications previously tried: glimepiride  -Current home glucose readings fasting glucose: 106, 113, 66,59 post prandial glucose: 202, 157 -Reports  hypoglycemic/hyperglycemic symptoms some mornings when she is having the 50-60s blood sugars where she gets shaky and cannot get out of bed.  She takes glucose tablets and this usually resolves. -Current exercise: minimal -Educated on A1c and blood sugar goals; Exercise goal of 150 minutes per week; Prevention and management of hypoglycemic episodes; Benefits of routine self-monitoring of blood sugar; -Counseled to check feet daily and get yearly eye exams -Counseled on diet and exercise extensively Recommended to continue current medication Recommended recheck A1c at upcoming physical and to bring in glucose log.  If she continues to have low blood sugars, I would consider decreasing her basal insulin dose to 60 units hs and titrate down based on glucose readings.    Also assessed patient finances and she reports high copay on Trulicity.  Based on reported income she would qualify for patient assistance.  Will have application mailed to her to bring in once she has signed required portion.  GERD (Goal: Minimize symptoms) -Controlled -Current treatment  Pantoprazole 40mg  daily -Medications previously tried: none noted -Denies symptoms, continues to work on triggers  -Recommended to continue current medication   Patient Goals/Self-Care Activities Patient will:  - take medications as prescribed check glucose daily, document, and provide at future appointments check blood pressure a few times per week, document, and provide at future appointments collaborate with provider on medication access solutions  Follow Up Plan: The care management team will reach out to the patient again over the next 90 days.      Ms. Cutbirth was given information about Chronic Care Management services today including:  CCM service includes personalized support from designated clinical staff supervised by her physician, including individualized plan of care and coordination with other care providers 24/7  contact phone numbers for assistance for urgent and routine care needs. Standard insurance, coinsurance, copays and deductibles apply for chronic care management only during months in which we provide at least 20 minutes of these services. Most insurances cover these services at 100%, however patients may be responsible for any copay, coinsurance and/or deductible if applicable. This service may help you avoid the need for more expensive face-to-face services. Only one practitioner may furnish and bill the service in a calendar month. The patient may stop CCM services at any time (effective at the end of the month) by phone call to the office staff.  Patient agreed to services and verbal consent obtained.   The patient verbalized understanding of instructions, educational materials, and care plan provided today and agreed to receive a mailed copy of patient instructions, educational materials, and care plan.  Telephone follow up appointment with pharmacy team member scheduled for:[address] [place], [facility]

## 2020-10-09 ENCOUNTER — Other Ambulatory Visit: Payer: Self-pay

## 2020-10-09 ENCOUNTER — Ambulatory Visit (INDEPENDENT_AMBULATORY_CARE_PROVIDER_SITE_OTHER): Payer: Medicare HMO | Admitting: Nurse Practitioner

## 2020-10-09 VITALS — BP 154/72 | HR 62 | Temp 97.3°F | Resp 16 | Ht 64.0 in | Wt 211.4 lb

## 2020-10-09 DIAGNOSIS — E782 Mixed hyperlipidemia: Secondary | ICD-10-CM | POA: Diagnosis not present

## 2020-10-09 DIAGNOSIS — E1122 Type 2 diabetes mellitus with diabetic chronic kidney disease: Secondary | ICD-10-CM | POA: Diagnosis not present

## 2020-10-09 DIAGNOSIS — L209 Atopic dermatitis, unspecified: Secondary | ICD-10-CM

## 2020-10-09 DIAGNOSIS — Z0001 Encounter for general adult medical examination with abnormal findings: Secondary | ICD-10-CM

## 2020-10-09 DIAGNOSIS — I10 Essential (primary) hypertension: Secondary | ICD-10-CM | POA: Diagnosis not present

## 2020-10-09 DIAGNOSIS — R300 Dysuria: Secondary | ICD-10-CM

## 2020-10-09 DIAGNOSIS — D0511 Intraductal carcinoma in situ of right breast: Secondary | ICD-10-CM | POA: Diagnosis not present

## 2020-10-09 DIAGNOSIS — E1142 Type 2 diabetes mellitus with diabetic polyneuropathy: Secondary | ICD-10-CM

## 2020-10-09 DIAGNOSIS — N184 Chronic kidney disease, stage 4 (severe): Secondary | ICD-10-CM | POA: Diagnosis not present

## 2020-10-09 DIAGNOSIS — Z124 Encounter for screening for malignant neoplasm of cervix: Secondary | ICD-10-CM | POA: Diagnosis not present

## 2020-10-09 DIAGNOSIS — Z0189 Encounter for other specified special examinations: Secondary | ICD-10-CM | POA: Diagnosis not present

## 2020-10-09 DIAGNOSIS — Z794 Long term (current) use of insulin: Secondary | ICD-10-CM

## 2020-10-09 DIAGNOSIS — E559 Vitamin D deficiency, unspecified: Secondary | ICD-10-CM

## 2020-10-09 LAB — POCT GLYCOSYLATED HEMOGLOBIN (HGB A1C): Hemoglobin A1C: 6.8 % — AB (ref 4.0–5.6)

## 2020-10-09 MED ORDER — PREGABALIN 100 MG PO CAPS: 100.0000 mg | ORAL_CAPSULE | Freq: Two times a day (BID) | ORAL | 0 refills | Status: DC

## 2020-10-09 MED ORDER — LEVEMIR FLEXTOUCH 100 UNIT/ML ~~LOC~~ SOPN: PEN_INJECTOR | SUBCUTANEOUS | 2 refills | Status: DC

## 2020-10-09 MED ORDER — TRIAMCINOLONE ACETONIDE 0.025 % EX CREA: 1.0000 "application " | TOPICAL_CREAM | Freq: Two times a day (BID) | CUTANEOUS | 2 refills | Status: DC

## 2020-10-09 NOTE — Progress Notes (Signed)
[facility] [place], [place] [address]  Internal MEDICINE  Office Visit Note  Patient Name: Kara [name]  [id number]  [id number]  Date of Service: 10/17/2020  Chief Complaint  Patient presents with   Medicare Wellness    Right hip, pain started a couple weeks ago, gets worse as the day goes on, needs trulicity form    Quality Metric Gaps    Shingrix, eye exam    HPI Weda presents for an annual well visit and physical exam. she has a history of anemia, diabetes, arthritis, hyperlipidemia, hypertension, kidney disease and breast cancer. 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 Her children are grown.  -right hip pain worse as the day goes on, started a couple of weeks ago.  -mammogram is scheduled for October 2022. Colonoscopy is due in 2031.  Declined shingles vaccine. -A1C is 6.8, up by 0.1 since march 2022. She is on sliding scale and basal insulin as well as trulicity. Patient reports that she has not been compliant with her diet. -history of anemia, recheck CBC Blood pressure is elevated today. Reports that it runs lower at home. Takes verapamil, lisinopril, and furosemide.  -history of breast cancer-takes tamoxifen    Current Medication: Outpatient Encounter Medications as of 10/09/2020  Medication Sig   Aflibercept (EYLEA) 2 MG/0.05ML SOLN    APPLE CIDER VINEGAR PO Take 2 tablets by mouth daily.    aspirin EC 81 MG tablet Take 81 mg by mouth daily.    cholecalciferol (VITAMIN D) 1000 UNITS tablet Take 2,000 Units by mouth daily.    clobetasol cream (TEMOVATE) 0.05 % Apply to more severely affected areas of rash twice a day until improved. Avoid face, groin, underarms.   doxycycline (MONODOX) 100 MG capsule Take 1 capsule (100 mg total) by mouth 2 (two) times daily. Take with food.   Ferrous Gluconate 324 (37.5 Fe) MG TABS Take 324 mg by mouth 2 (two) times daily.    furosemide (LASIX) 20 MG tablet  Take 20 mg by mouth 2 (two) times daily.   hydrOXYzine (ATARAX/VISTARIL) 10 MG tablet TAKE 1-3 TABLETS BY MOUTH EVERY NIGHT AS NEEDED FOR ITCH.   insulin aspart (NOVOLOG FLEXPEN) 100 UNIT/ML FlexPen Inject [place] twice daily as directed per sliding scale. Max daily dose is 30 units. Dx E11.65.   Lactobacillus (PROBIOTIC ACIDOPHILUS) CAPS Take 1 capsule by mouth daily.   lisinopril (ZESTRIL) 10 MG tablet Take 10 mg by mouth daily.   Multiple Vitamin (MULTIVITAMIN) tablet Take 1 tablet by mouth daily at 12 noon.   pantoprazole (PROTONIX) 40 MG tablet TAKE 1 TABLET(S) BY MOUTH DAILY   rosuvastatin (CRESTOR) 20 MG tablet TAKE 1 TABLET BY MOUTH EVERYDAY AT BEDTIME   tamoxifen (NOLVADEX) 20 MG tablet Take 1 tablet (20 mg total) by mouth daily.   traMADol-acetaminophen (ULTRACET) 37.5-325 MG tablet TAKE 1 TABLET BY MOUTH 2 (TWO) TIMES DAILY AS NEEDED FOR MODERATE PAIN.   triamcinolone (KENALOG) 0.1 % Apply 1 application topically 2 (two) times daily as needed (Rash). Avoid face, groin, underarms.   TRULICITY 1.5 BS/4.9QP SOPN INJECT 1.5 MG INTO THE SKIN ONCE A WEEK.   verapamil (CALAN) 40 MG tablet Take 1 tablet (40 mg total) by mouth 2 (two) times daily.   [DISCONTINUED] insulin detemir (LEVEMIR FLEXTOUCH) 100 UNIT/ML FlexPen INJECT UP TO 75 UNITS UNDER THE SKIN DAILY   [DISCONTINUED] pregabalin (LYRICA) 100 MG capsule Take 1 capsule (100 mg  total) by mouth 2 (two) times daily.   [DISCONTINUED] triamcinolone (KENALOG) 0.025 % cream Apply 1 application topically 2 (two) times daily.   insulin detemir (LEVEMIR FLEXTOUCH) 100 UNIT/ML FlexPen INJECT UP TO 75 UNITS UNDER THE SKIN DAILY   pregabalin (LYRICA) 100 MG capsule Take 1 capsule (100 mg total) by mouth 2 (two) times daily.   triamcinolone (KENALOG) 0.025 % cream Apply 1 application topically 2 (two) times daily.   No facility-administered encounter medications on file as of 10/09/2020.    Surgical History: Past Surgical History:  Procedure  Laterality Date   BILATERAL SALPINGECTOMY     BREAST BIOPSY Right 2014   neg- core   BREAST BIOPSY Right 12/21/2018   [place] bx, venus clip,  DUCTAL CARCINOMA IN SITU   BREAST BIOPSY Right 12/21/2018   [place] bx, ribbon clip,  FIBROEPITHELIAL PROLIFERATION WITH SCLEROSIS   BREAST BIOPSY Right 01/01/2019   Affirm bx "X" clip-path pending   BREAST DUCTAL SYSTEM EXCISION     BREAST EXCISIONAL BIOPSY Right 2014   neg   BREAST LUMPECTOMY Right 01/22/2019   path pending   CATARACT EXTRACTION     CHOLECYSTECTOMY     CYSTOSCOPY WITH STENT PLACEMENT Left 04/14/2015   Procedure: CYSTOSCOPY WITH STENT PLACEMENT;  Surgeon: Hollice Espy, MD;  Location: [facility];  Service: Urology;  Laterality: Left;   EXTRACORPOREAL SHOCK WAVE LITHOTRIPSY Left 09/29/2014   Procedure: EXTRACORPOREAL SHOCK WAVE LITHOTRIPSY (ESWL);  Surgeon: Hollice Espy, MD;  Location: [facility];  Service: Urology;  Laterality: Left;   EYE SURGERY     bilateral cataract   IRRIGATION AND DEBRIDEMENT ABSCESS Right 03/24/2019   Procedure: IRRIGATION AND DEBRIDEMENT ABSCESS RIGHT BREAST;  Surgeon: Herbert Pun, MD;  Location: [facility];  Service: General;  Laterality: Right;   LAPAROSCOPIC OOPHERECTOMY Left    PARTIAL MASTECTOMY WITH NEEDLE LOCALIZATION Right 01/22/2019   Procedure: PARTIAL MASTECTOMY WITH NEEDLE LOCALIZATION;  Surgeon: Herbert Pun, MD;  Location: [facility];  Service: General;  Laterality: Right;   TONSILLECTOMY     TUBAL LIGATION     URETEROSCOPY WITH HOLMIUM LASER LITHOTRIPSY Left 04/14/2015   Procedure: URETEROSCOPY WITH HOLMIUM LASER LITHOTRIPSY;  Surgeon: Hollice Espy, MD;  Location: [facility];  Service: Urology;  Laterality: Left;    Medical History: Past Medical History:  Diagnosis Date   Anemia    Arthropathy    Benign neoplasm of breast    Breast cancer ([place])    Chest pain    Chronic kidney disease    30% kidney function   Diabetes ([place])    Diabetic retinopathy ([place])    Dyspnea    Edema     History of kidney stones    Hyperlipemia    Hypersomnia with sleep apnea    Hypertension    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HCC)    Lumbago    Osteoarthrosis    Ovarian failure    Personal history of radiation therapy    SOB (shortness of breath)     Family History: Family History  Problem Relation Age of Onset   Breast cancer Maternal Grandmother 88   Colon cancer Mother    Lung cancer Father     Social History   Socioeconomic History   Marital status: Married    Spouse name: Not on file   Number of children: Not on file   Years of education: Not on file   Highest education level: Not on file  Occupational History   Not on file  Tobacco Use  Smoking status: Former    Packs/day: 1.00    Years: 30.00    Pack years: 30.00    Types: Cigarettes    Quit date: 04/11/1995    Years since quitting: 25.5   Smokeless tobacco: Never  Vaping Use   Vaping Use: Never used  Substance and Sexual Activity   Alcohol use: No    Alcohol/week: 0.0 standard drinks   Drug use: No   Sexual activity: Not on file  Other Topics Concern   Not on file  Social History Narrative   Not on file   Social Determinants of Health   Financial Resource Strain: Low Risk    Difficulty of Paying Living Expenses: Not very hard  Food Insecurity: Not on file  Transportation Needs: Not on file  Physical Activity: Not on file  Stress: Not on file  Social Connections: Not on file  Intimate Partner Violence: Not on file      Review of Systems  Constitutional:  Negative for activity change, appetite change, chills, fatigue, fever and unexpected weight change.  HENT: Negative.  Negative for congestion, ear pain, rhinorrhea, sore throat and trouble swallowing.   Eyes: Negative.   Respiratory: Negative.  Negative for cough, chest tightness, shortness of breath and wheezing.   Cardiovascular: Negative.  Negative for chest pain.  Gastrointestinal: Negative.  Negative for abdominal pain, blood  in stool, constipation, diarrhea, nausea and vomiting.  Endocrine: Negative.   Genitourinary: Negative.  Negative for difficulty urinating, dysuria, frequency, hematuria and urgency.  Musculoskeletal: Negative.  Negative for arthralgias, back pain, joint swelling, myalgias and neck pain.  Skin: Negative.  Negative for rash and wound.  Allergic/Immunologic: Negative.  Negative for immunocompromised state.  Neurological: Negative.  Negative for dizziness, seizures, numbness and headaches.  Hematological: Negative.   Psychiatric/Behavioral: Negative.  Negative for behavioral problems, self-injury and suicidal ideas. The patient is not nervous/anxious.    Vital Signs: BP (!) 154/72   Pulse 62   Temp (!) 97.3 F (36.3 C)   Resp 16   Ht 5' 4"  (1.626 m)   Wt 211 lb 6.4 oz (95.9 kg)   SpO2 96%   BMI 36.29 kg/m    Physical Exam Vitals reviewed.  Constitutional:      General: She is not in acute distress.    Appearance: She is well-developed. She is not diaphoretic.  HENT:     Head: Normocephalic and atraumatic.     Mouth/Throat:     Pharynx: No oropharyngeal exudate.  Eyes:     Pupils: Pupils are equal, round, and reactive to light.  Neck:     Thyroid: No thyromegaly.     Vascular: No JVD.     Trachea: No tracheal deviation.  Cardiovascular:     Rate and Rhythm: Normal rate and regular rhythm.     Heart sounds: Normal heart sounds. No murmur heard.   No friction rub. No gallop.  Pulmonary:     Effort: Pulmonary effort is normal. No respiratory distress.     Breath sounds: No wheezing or rales.  Chest:     Chest wall: No tenderness.  Abdominal:     General: Bowel sounds are normal.     Palpations: Abdomen is soft.  Musculoskeletal:        General: Normal range of motion.     Cervical back: Normal range of motion and neck supple.  Lymphadenopathy:     Cervical: No cervical adenopathy.  Skin:    General: Skin is warm and dry.  Neurological:     Mental Status: She is  alert and oriented to person, place, and time.     Cranial Nerves: No cranial nerve deficit.  Psychiatric:        Behavior: Behavior normal.        Thought Content: Thought content normal.        Judgment: Judgment normal.       Assessment/Plan: 1. Encounter for routine adult health examination with abnormal findings Age-appropriate preventive screenings discussed, annual physical exam completed.   2. Encounter for routine laboratory testing Routine labs ordered - CBC with Differential/Platelet - CMP14+EGFR - TSH + free T4 - Iron, TIBC and Ferritin Panel - Lipid Profile  3. Type 2 diabetes mellitus with stage 4 chronic kidney disease, with long-term current use of insulin (HCC) A1C is 6.8 today, no changes in current treatment regimen. Patient encouraged to adhere to diabetic diet, limiting high carb and high sugar foods. Levemir refill ordered. Recheck A1C in 3 months.  - POCT HgB A1C - insulin detemir (LEVEMIR FLEXTOUCH) 100 UNIT/ML FlexPen; INJECT UP TO 75 UNITS UNDER THE SKIN DAILY  Dispense: 24 mL; Refill: 2  4. Diabetic polyneuropathy associated with type 2 diabetes mellitus (HCC) Takes pregabalin for diabetic neuropathy, refill ordered - pregabalin (LYRICA) 100 MG capsule; Take 1 capsule (100 mg total) by mouth 2 (two) times daily.  Dispense: 60 capsule; Refill: 0  5. Essential hypertension Blood pressure elevated during todays visit, patient reports it is lower at home and always higher when she is at the clinic. Currently taking verapamil, lisinopril and furosemide. No changes to current treatment regimen.   6. Mixed hyperlipidemia Taking rosuvastatin 20 mg daily, will recheck lipid panel -Lipid profile  7. Ductal carcinoma in situ (DCIS) of right breast Followed by oncology, takes tamoxifen. S/P right breast lumpectomy.  8. Atopic dermatitis, unspecified type Uses triamcinolone as needed for eczema, refill ordered.  - triamcinolone (KENALOG) 0.025 % cream; Apply  1 application topically 2 (two) times daily.  Dispense: 80 g; Refill: 2  9. Vitamin D deficiency History of vitamin D deficiency, check vitamin D level - Vitamin D (25 hydroxy)  10. Dysuria Routine urinalysis done.  - UA/M w/rflx Culture, Routine      General Counseling: Contina verbalizes understanding of the findings of todays visit and agrees with plan of treatment. I have discussed any further diagnostic evaluation that may be needed or ordered today. We also reviewed her medications today. she has been encouraged to call the office with any questions or concerns that should arise related to todays visit.    Orders Placed This Encounter  Procedures   UA/M w/rflx Culture, Routine   CBC with Differential/Platelet   CMP14+EGFR   TSH + free T4   Vitamin D (25 hydroxy)   Iron, TIBC and Ferritin Panel   Lipid Profile   POCT HgB A1C    Meds ordered this encounter  Medications   insulin detemir (LEVEMIR FLEXTOUCH) 100 UNIT/ML FlexPen    Sig: INJECT UP TO 75 UNITS UNDER THE SKIN DAILY    Dispense:  24 mL    Refill:  2    DX Code Needed  . e11.65   pregabalin (LYRICA) 100 MG capsule    Sig: Take 1 capsule (100 mg total) by mouth 2 (two) times daily.    Dispense:  60 capsule    Refill:  0   triamcinolone (KENALOG) 0.025 % cream    Sig: Apply 1 application topically 2 (two) times daily.  Dispense:  80 g    Refill:  2     Return in about 3 months (around 01/09/2021) for F/U, Recheck A1C, Devaeh Amadi PCP.   Total time spent:30 Minutes Time spent includes review of chart, medications, test results, and follow up plan with the patient.    Controlled Substance Database was reviewed by me.  This patient was seen by Jonetta Osgood, FNP-C in collaboration with Dr. Clayborn Bigness as a part of collaborative care agreement.  Angeliah Wisdom R. Valetta Fuller, MSN, FNP-C Internal medicine

## 2020-10-10 LAB — UA/M W/RFLX CULTURE, ROUTINE

## 2020-10-16 DIAGNOSIS — Z0189 Encounter for other specified special examinations: Secondary | ICD-10-CM | POA: Diagnosis not present

## 2020-10-16 DIAGNOSIS — E038 Other specified hypothyroidism: Secondary | ICD-10-CM | POA: Diagnosis not present

## 2020-10-16 DIAGNOSIS — D509 Iron deficiency anemia, unspecified: Secondary | ICD-10-CM | POA: Diagnosis not present

## 2020-10-16 DIAGNOSIS — E782 Mixed hyperlipidemia: Secondary | ICD-10-CM | POA: Diagnosis not present

## 2020-10-16 DIAGNOSIS — E559 Vitamin D deficiency, unspecified: Secondary | ICD-10-CM | POA: Diagnosis not present

## 2020-10-17 ENCOUNTER — Other Ambulatory Visit: Payer: Self-pay | Admitting: Nurse Practitioner

## 2020-10-17 ENCOUNTER — Other Ambulatory Visit: Payer: Self-pay | Admitting: Internal Medicine

## 2020-10-17 DIAGNOSIS — E119 Type 2 diabetes mellitus without complications: Secondary | ICD-10-CM | POA: Insufficient documentation

## 2020-10-17 DIAGNOSIS — Z794 Long term (current) use of insulin: Secondary | ICD-10-CM | POA: Insufficient documentation

## 2020-10-17 LAB — VITAMIN D 25 HYDROXY (VIT D DEFICIENCY, FRACTURES): Vit D, 25-Hydroxy: 44.1 ng/mL (ref 30.0–100.0)

## 2020-10-17 LAB — CMP14+EGFR
ALT: 19 IU/L (ref 0–32)
AST: 23 IU/L (ref 0–40)
Albumin/Globulin Ratio: 1.4 (ref 1.2–2.2)
Albumin: 3.9 g/dL (ref 3.7–4.7)
Alkaline Phosphatase: 61 IU/L (ref 44–121)
BUN/Creatinine Ratio: 18 (ref 12–28)
BUN: 35 mg/dL — ABNORMAL HIGH (ref 8–27)
Bilirubin Total: 0.3 mg/dL (ref 0.0–1.2)
CO2: 22 mmol/L (ref 20–29)
Calcium: 9.2 mg/dL (ref 8.7–10.3)
Chloride: 101 mmol/L (ref 96–106)
Creatinine, Ser: 1.94 mg/dL — ABNORMAL HIGH (ref 0.57–1.00)
Globulin, Total: 2.7 g/dL (ref 1.5–4.5)
Glucose: 101 mg/dL — ABNORMAL HIGH (ref 65–99)
Potassium: 4.4 mmol/L (ref 3.5–5.2)
Sodium: 139 mmol/L (ref 134–144)
Total Protein: 6.6 g/dL (ref 6.0–8.5)
eGFR: 27 mL/min/{1.73_m2} — ABNORMAL LOW (ref >59)

## 2020-10-17 LAB — IRON,TIBC AND FERRITIN PANEL
Ferritin: 205 ng/mL — ABNORMAL HIGH (ref 15–150)
Iron Saturation: 21 % (ref 15–55)
Iron: 50 ug/dL (ref 27–139)
Total Iron Binding Capacity: 235 ug/dL — ABNORMAL LOW (ref 250–450)
UIBC: 185 ug/dL (ref 118–369)

## 2020-10-17 LAB — LIPID PANEL
Chol/HDL Ratio: 3.3 ratio (ref 0.0–4.4)
Cholesterol, Total: 144 mg/dL (ref 100–199)
HDL: 44 mg/dL (ref >39)
LDL Chol Calc (NIH): 62 mg/dL (ref 0–99)
Triglycerides: 234 mg/dL — ABNORMAL HIGH (ref 0–149)
VLDL Cholesterol Cal: 38 mg/dL (ref 5–40)

## 2020-10-17 LAB — CBC WITH DIFFERENTIAL/PLATELET
Basophils Absolute: 0.1 10*3/uL (ref 0.0–0.2)
Basos: 1 %
EOS (ABSOLUTE): 0.3 10*3/uL (ref 0.0–0.4)
Eos: 5 %
Hematocrit: 35 % (ref 34.0–46.6)
Hemoglobin: 11.1 g/dL (ref 11.1–15.9)
Immature Grans (Abs): 0 10*3/uL (ref 0.0–0.1)
Immature Granulocytes: 0 %
Lymphocytes Absolute: 2 10*3/uL (ref 0.7–3.1)
Lymphs: 32 %
MCH: 27.3 pg (ref 26.6–33.0)
MCHC: 31.7 g/dL (ref 31.5–35.7)
MCV: 86 fL (ref 79–97)
Monocytes Absolute: 0.7 10*3/uL (ref 0.1–0.9)
Monocytes: 11 %
Neutrophils Absolute: 3.1 10*3/uL (ref 1.4–7.0)
Neutrophils: 51 %
Platelets: 146 10*3/uL — ABNORMAL LOW (ref 150–450)
RBC: 4.07 x10E6/uL (ref 3.77–5.28)
RDW: 15.7 % — ABNORMAL HIGH (ref 11.7–15.4)
WBC: 6.2 10*3/uL (ref 3.4–10.8)

## 2020-10-17 LAB — TSH+FREE T4
Free T4: 1.25 ng/dL (ref 0.82–1.77)
TSH: 1.78 u[IU]/mL (ref 0.450–4.500)

## 2020-10-19 DIAGNOSIS — K219 Gastro-esophageal reflux disease without esophagitis: Secondary | ICD-10-CM | POA: Diagnosis not present

## 2020-10-19 DIAGNOSIS — I10 Essential (primary) hypertension: Secondary | ICD-10-CM | POA: Diagnosis not present

## 2020-10-19 DIAGNOSIS — E1165 Type 2 diabetes mellitus with hyperglycemia: Secondary | ICD-10-CM | POA: Diagnosis not present

## 2020-10-26 ENCOUNTER — Other Ambulatory Visit: Payer: Self-pay

## 2020-10-26 ENCOUNTER — Telehealth: Payer: Self-pay

## 2020-10-26 DIAGNOSIS — M25572 Pain in left ankle and joints of left foot: Secondary | ICD-10-CM

## 2020-10-26 DIAGNOSIS — M25551 Pain in right hip: Secondary | ICD-10-CM

## 2020-10-26 DIAGNOSIS — S93402A Sprain of unspecified ligament of left ankle, initial encounter: Secondary | ICD-10-CM | POA: Diagnosis not present

## 2020-10-26 DIAGNOSIS — M533 Sacrococcygeal disorders, not elsewhere classified: Secondary | ICD-10-CM | POA: Diagnosis not present

## 2020-10-26 DIAGNOSIS — M25552 Pain in left hip: Secondary | ICD-10-CM

## 2020-10-26 NOTE — Telephone Encounter (Signed)
Left ankle and left hip pain after fall, was already having right hip pain

## 2020-10-26 NOTE — Telephone Encounter (Signed)
Try to call pt no answer that order in epic for hip xray and ankle xray at  out pt imaging

## 2020-10-26 NOTE — Telephone Encounter (Signed)
Patient called and stated that they need an xray ordered. They stated they fell at [facility] on 10/16/2020. I stated to the pt that they will need to be seen at our office, the pt did not want to be seen in the office at this time. I advised she could go to [facility] as they have a walk in. I advised if she changed her mind she could give [place] a call back to schedule an appt.

## 2020-10-28 ENCOUNTER — Other Ambulatory Visit: Payer: Self-pay | Admitting: Internal Medicine

## 2020-10-28 DIAGNOSIS — E1142 Type 2 diabetes mellitus with diabetic polyneuropathy: Secondary | ICD-10-CM

## 2020-10-30 DIAGNOSIS — S93402A Sprain of unspecified ligament of left ankle, initial encounter: Secondary | ICD-10-CM | POA: Diagnosis not present

## 2020-10-30 NOTE — Telephone Encounter (Signed)
Please review and fill if appropriate LOV: 10/09/20 NOV: 01/12/21

## 2020-10-31 DIAGNOSIS — S93402A Sprain of unspecified ligament of left ankle, initial encounter: Secondary | ICD-10-CM | POA: Diagnosis not present

## 2020-10-31 NOTE — Telephone Encounter (Signed)
Refill of pregabalin sent to pharmacy

## 2020-11-08 ENCOUNTER — Telehealth: Payer: Self-pay | Admitting: Pharmacist

## 2020-11-08 NOTE — Progress Notes (Addendum)
Chronic Care Management Pharmacy Assistant   Name: Kara [name]  MRN: [id number] DOB: [birth year]  Reason for Encounter: Disease State For DM and HTN.    Conditions to be addressed/monitored: HTN, GERD, Type II DM w/ CKD, HLD, Breast Cancer  Recent office visits:  10/09/20 Jonetta Osgood, NP. For routine adult health examination. No medication changes.   Recent consult visits:  None since 10/03/20  Hospital visits:  None since 10/03/20  Medications: Outpatient Encounter Medications as of 11/08/2020  Medication Sig   Aflibercept (EYLEA) 2 MG/0.05ML SOLN    APPLE CIDER VINEGAR PO Take 2 tablets by mouth daily.    aspirin EC 81 MG tablet Take 81 mg by mouth daily.    cholecalciferol (VITAMIN D) 1000 UNITS tablet Take 2,000 Units by mouth daily.    clobetasol cream (TEMOVATE) 0.05 % Apply to more severely affected areas of rash twice a day until improved. Avoid face, groin, underarms.   doxycycline (MONODOX) 100 MG capsule Take 1 capsule (100 mg total) by mouth 2 (two) times daily. Take with food.   Ferrous Gluconate 324 (37.5 Fe) MG TABS Take 324 mg by mouth 2 (two) times daily.    furosemide (LASIX) 20 MG tablet Take 20 mg by mouth 2 (two) times daily.   hydrOXYzine (ATARAX/VISTARIL) 10 MG tablet TAKE 1-3 TABLETS BY MOUTH EVERY NIGHT AS NEEDED FOR ITCH.   insulin detemir (LEVEMIR FLEXTOUCH) 100 UNIT/ML FlexPen INJECT UP TO 75 UNITS UNDER THE SKIN DAILY   Lactobacillus (PROBIOTIC ACIDOPHILUS) CAPS Take 1 capsule by mouth daily.   lisinopril (ZESTRIL) 10 MG tablet Take 10 mg by mouth daily.   Multiple Vitamin (MULTIVITAMIN) tablet Take 1 tablet by mouth daily at 12 noon.   NOVOLOG FLEXPEN 100 UNIT/ML FlexPen INJECT  TWICE DAILY AS DIRECTED PER SLIDING SCALE. MAX DAILY DOSE IS 30 UNITS. DX E11.65.   pantoprazole (PROTONIX) 40 MG tablet TAKE 1 TABLET(S) BY MOUTH DAILY   pregabalin (LYRICA) 100 MG capsule TAKE 1 CAPSULE BY MOUTH TWICE A DAY   rosuvastatin (CRESTOR) 20 MG tablet  TAKE 1 TABLET BY MOUTH EVERYDAY AT BEDTIME   tamoxifen (NOLVADEX) 20 MG tablet Take 1 tablet (20 mg total) by mouth daily.   traMADol-acetaminophen (ULTRACET) 37.5-325 MG tablet TAKE 1 TABLET BY MOUTH 2 (TWO) TIMES DAILY AS NEEDED FOR MODERATE PAIN.   triamcinolone (KENALOG) 0.025 % cream Apply 1 application topically 2 (two) times daily.   triamcinolone (KENALOG) 0.1 % Apply 1 application topically 2 (two) times daily as needed (Rash). Avoid face, groin, underarms.   TRULICITY 1.5 LF/8.1OF SOPN INJECT 1.5 MG INTO THE SKIN ONCE A WEEK.   verapamil (CALAN) 40 MG tablet Take 1 tablet (40 mg total) by mouth 2 (two) times daily.   No facility-administered encounter medications on file as of 11/08/2020.    Recent Relevant Labs: Lab Results  Component Value Date/Time   HGBA1C 6.8 (A) 10/09/2020 10:57 AM   HGBA1C 6.7 (A) 07/03/2020 11:02 AM   HGBA1C 7.6 (H) 03/21/2019 08:12 PM    Kidney Function Lab Results  Component Value Date/Time   CREATININE 1.94 (H) 10/16/2020 08:31 AM   CREATININE 1.51 (H) 08/16/2019 10:35 AM   CREATININE 1.47 (H) 03/08/2013 11:37 AM   CREATININE 0.89 07/23/2011 11:03 AM   GFRNONAA 34 (L) 08/16/2019 10:35 AM   GFRNONAA 37 (L) 03/08/2013 11:37 AM   GFRAA 39 (L) 08/16/2019 10:35 AM   GFRAA 43 (L) 03/08/2013 11:37 AM    Current antihyperglycemic regimen:  Novolog  twice daily via sliding scale with max daily dose of 15 units Levemir 70 units daily Trulicity 1.5mg  once weekly - Mondays Lyrica 100mg  twice daily  What recent interventions/DTPs have been made to improve glycemic control:  None.   Have there been any recent hospitalizations or ED visits since last visit with CPP? Patient stated no.  Patient reports hypoglycemic symptoms, including None  Patient reports hyperglycemic symptoms, including none  How often are you checking your blood sugar? Patient stated twice daily  What are your blood sugars ranging? Patient stated her blood sugar ranges around  these numbers during these times.  Morning 70-110 Afternoon 90-130  Dinner 170-217  During the week, how often does your blood glucose drop below 70? Patient stated her blood sugar drops below 70 mostly in rhe morning.   Are you checking your feet daily/regularly?  Patient stated she checks her feet regularly.   Adherence Review: Is the patient currently on a STATIN medication? Rosuvastatin 20 mg  Is the patient currently on ACE/ARB medication? Lisinopril 10 mg  Does the patient have >5 day gap between last estimated fill dates? Per misc rpts, no.   Reviewed chart prior to disease state call. Spoke with patient regarding BP  Recent Office Vitals: BP Readings from Last 3 Encounters:  10/09/20 (!) 154/72  07/03/20 128/82  04/20/20 (!) 150/70   Pulse Readings from Last 3 Encounters:  10/09/20 62  07/03/20 62  04/20/20 75    Wt Readings from Last 3 Encounters:  10/09/20 211 lb 6.4 oz (95.9 kg)  07/03/20 207 lb 12.8 oz (94.3 kg)  04/20/20 213 lb 9.6 oz (96.9 kg)     Kidney Function Lab Results  Component Value Date/Time   CREATININE 1.94 (H) 10/16/2020 08:31 AM   CREATININE 1.51 (H) 08/16/2019 10:35 AM   CREATININE 1.47 (H) 03/08/2013 11:37 AM   CREATININE 0.89 07/23/2011 11:03 AM   GFRNONAA 34 (L) 08/16/2019 10:35 AM   GFRNONAA 37 (L) 03/08/2013 11:37 AM   GFRAA 39 (L) 08/16/2019 10:35 AM   GFRAA 43 (L) 03/08/2013 11:37 AM    BMP Latest Ref Rng & Units 10/16/2020 08/16/2019 03/25/2019  Glucose 65 - 99 mg/dL 101(H) 180(H) -  BUN 8 - 27 mg/dL 35(H) 25 -  Creatinine 0.57 - 1.00 mg/dL 1.94(H) 1.51(H) 2.12(H)  BUN/Creat Ratio 12 - 28 18 17  -  Sodium 134 - 144 mmol/L 139 138 -  Potassium 3.5 - 5.2 mmol/L 4.4 4.2 -  Chloride 96 - 106 mmol/L 101 97 -  CO2 20 - 29 mmol/L 22 27 -  Calcium 8.7 - 10.3 mg/dL 9.2 9.3 -    Current antihypertensive regimen:  Verapamil 40mg  daily Lisinopril 10mg  daily  How often are you checking your Blood Pressure? Patient stated she checks  her blood pressure every other day.  Current home BP readings: Patient stated her blood pressure readings usually range around 130/60-70s  What recent interventions/DTPs have been made by any provider to improve Blood Pressure control since last CPP Visit: None.   Any recent hospitalizations or ED visits since last visit with CPP? Patient stated no.   What diet changes have been made to improve Blood Pressure Control?  Patient stated she is trying to eat a lot of fruit and salads. She stated she eats a lot saleem fish.   What exercise is being done to improve your Blood Pressure Control?  Patient stated she recently fell and has a boot on her foot from hurting her ankle but  usually she walks weekly to get her exercise.   Adherence Review: Is the patient currently on ACE/ARB medication? .Lisinopril 10 mg  Does the patient have >5 day gap between last estimated fill dates? Per misc rpts, no.  Star Rating Drugs: Pregablin 100 mg 30 DS 10/02/20, Rosuvastatin 20 mg 90 DS 07/27/20, Lisinopril 10 mg 90 DS 09/14/20.   Patient is wanting to know the status of her trulicity assistance. I explained to her that I was able to check on that for her and give her a call back in regards of this.   Follow-Up:Pharmacist Review  Charlann Lange, RMA Clinical Pharmacist Assistant [phone]  6 minutes spent in review, coordination, and documentation.  Reviewed by: Beverly Milch, PharmD Clinical Pharmacist [phone]

## 2020-11-10 ENCOUNTER — Telehealth: Payer: Self-pay | Admitting: Pharmacist

## 2020-11-10 ENCOUNTER — Other Ambulatory Visit: Payer: Self-pay

## 2020-11-10 MED ORDER — TRULICITY 1.5 MG/0.5ML ~~LOC~~ SOAJ: 1.5000 mg | SUBCUTANEOUS | 4 refills | Status: DC

## 2020-11-10 NOTE — Progress Notes (Addendum)
    Chronic Care Management Pharmacy Assistant   Name: Kara [name]  MRN: [id number] DOB: Oct 10, 1945  Reason for Encounter: PAP  Medications: Outpatient Encounter Medications as of 11/10/2020  Medication Sig   Aflibercept (EYLEA) 2 MG/0.05ML SOLN    APPLE CIDER VINEGAR PO Take 2 tablets by mouth daily.    aspirin EC 81 MG tablet Take 81 mg by mouth daily.    cholecalciferol (VITAMIN D) 1000 UNITS tablet Take 2,000 Units by mouth daily.    clobetasol cream (TEMOVATE) 0.05 % Apply to more severely affected areas of rash twice a day until improved. Avoid face, groin, underarms.   doxycycline (MONODOX) 100 MG capsule Take 1 capsule (100 mg total) by mouth 2 (two) times daily. Take with food.   Ferrous Gluconate 324 (37.5 Fe) MG TABS Take 324 mg by mouth 2 (two) times daily.    furosemide (LASIX) 20 MG tablet Take 20 mg by mouth 2 (two) times daily.   hydrOXYzine (ATARAX/VISTARIL) 10 MG tablet TAKE 1-3 TABLETS BY MOUTH EVERY NIGHT AS NEEDED FOR ITCH.   insulin detemir (LEVEMIR FLEXTOUCH) 100 UNIT/ML FlexPen INJECT UP TO 75 UNITS UNDER THE SKIN DAILY   Lactobacillus (PROBIOTIC ACIDOPHILUS) CAPS Take 1 capsule by mouth daily.   lisinopril (ZESTRIL) 10 MG tablet Take 10 mg by mouth daily.   Multiple Vitamin (MULTIVITAMIN) tablet Take 1 tablet by mouth daily at 12 noon.   NOVOLOG FLEXPEN 100 UNIT/ML FlexPen INJECT [place] TWICE DAILY AS DIRECTED PER SLIDING SCALE. MAX DAILY DOSE IS 30 UNITS. DX E11.65.   pantoprazole (PROTONIX) 40 MG tablet TAKE 1 TABLET(S) BY MOUTH DAILY   pregabalin (LYRICA) 100 MG capsule TAKE 1 CAPSULE BY MOUTH TWICE A DAY   rosuvastatin (CRESTOR) 20 MG tablet TAKE 1 TABLET BY MOUTH EVERYDAY AT BEDTIME   tamoxifen (NOLVADEX) 20 MG tablet Take 1 tablet (20 mg total) by mouth daily.   traMADol-acetaminophen (ULTRACET) 37.5-325 MG tablet TAKE 1 TABLET BY MOUTH 2 (TWO) TIMES DAILY AS NEEDED FOR MODERATE PAIN.   triamcinolone (KENALOG) 0.025 % cream Apply 1 application topically 2  (two) times daily.   triamcinolone (KENALOG) 0.1 % Apply 1 application topically 2 (two) times daily as needed (Rash). Avoid face, groin, underarms.   TRULICITY 1.5 EN/4.0HW SOPN INJECT 1.5 MG INTO THE SKIN ONCE A WEEK.   verapamil (CALAN) 40 MG tablet Take 1 tablet (40 mg total) by mouth 2 (two) times daily.   No facility-administered encounter medications on file as of 11/10/2020.   Spoke to the patient about her Trulicity 1.5 KG/8.8PJ, the patient stated she filled out the form and hasn't heard anything since then. I called [employer] about her status of her medication and they were able to inform me that she was denied because of the way she filled out the form, while on the phone we did an appeal and fixed the mistake and she was approved through Dec 31 st 2022. Called the patient and informed her and she was pleased.   Follow-Up:Pharmacist Review  Charlann Lange, [place] Pharmacist Assistant [phone]

## 2020-11-16 ENCOUNTER — Telehealth: Payer: Self-pay | Admitting: Pharmacist

## 2020-11-16 NOTE — Progress Notes (Addendum)
    Chronic Care Management Pharmacy Assistant   Name: Kara [name]  MRN: [id number] DOB: October 14, 1945  Reason for Encounter: Adherence Review  Medications: Outpatient Encounter Medications as of 11/16/2020  Medication Sig   Aflibercept (EYLEA) 2 MG/0.05ML SOLN    APPLE CIDER VINEGAR PO Take 2 tablets by mouth daily.    aspirin EC 81 MG tablet Take 81 mg by mouth daily.    cholecalciferol (VITAMIN D) 1000 UNITS tablet Take 2,000 Units by mouth daily.    clobetasol cream (TEMOVATE) 0.05 % Apply to more severely affected areas of rash twice a day until improved. Avoid face, groin, underarms.   doxycycline (MONODOX) 100 MG capsule Take 1 capsule (100 mg total) by mouth 2 (two) times daily. Take with food.   Dulaglutide (TRULICITY) 1.5 CR/7.5OH SOPN Inject 1.5 mg into the skin once a week.   Ferrous Gluconate 324 (37.5 Fe) MG TABS Take 324 mg by mouth 2 (two) times daily.    furosemide (LASIX) 20 MG tablet Take 20 mg by mouth 2 (two) times daily.   hydrOXYzine (ATARAX/VISTARIL) 10 MG tablet TAKE 1-3 TABLETS BY MOUTH EVERY NIGHT AS NEEDED FOR ITCH.   insulin detemir (LEVEMIR FLEXTOUCH) 100 UNIT/ML FlexPen INJECT UP TO 75 UNITS UNDER THE SKIN DAILY   Lactobacillus (PROBIOTIC ACIDOPHILUS) CAPS Take 1 capsule by mouth daily.   lisinopril (ZESTRIL) 10 MG tablet Take 10 mg by mouth daily.   Multiple Vitamin (MULTIVITAMIN) tablet Take 1 tablet by mouth daily at 12 noon.   NOVOLOG FLEXPEN 100 UNIT/ML FlexPen INJECT [place] TWICE DAILY AS DIRECTED PER SLIDING SCALE. MAX DAILY DOSE IS 30 UNITS. DX E11.65.   pantoprazole (PROTONIX) 40 MG tablet TAKE 1 TABLET(S) BY MOUTH DAILY   pregabalin (LYRICA) 100 MG capsule TAKE 1 CAPSULE BY MOUTH TWICE A DAY   rosuvastatin (CRESTOR) 20 MG tablet TAKE 1 TABLET BY MOUTH EVERYDAY AT BEDTIME   tamoxifen (NOLVADEX) 20 MG tablet Take 1 tablet (20 mg total) by mouth daily.   traMADol-acetaminophen (ULTRACET) 37.5-325 MG tablet TAKE 1 TABLET BY MOUTH 2 (TWO) TIMES DAILY AS  NEEDED FOR MODERATE PAIN.   triamcinolone (KENALOG) 0.025 % cream Apply 1 application topically 2 (two) times daily.   triamcinolone (KENALOG) 0.1 % Apply 1 application topically 2 (two) times daily as needed (Rash). Avoid face, groin, underarms.   verapamil (CALAN) 40 MG tablet Take 1 tablet (40 mg total) by mouth 2 (two) times daily.   No facility-administered encounter medications on file as of 11/16/2020.   Reviewed the patients chart for any medical/health and/or medication changes there were not any changes at this time.  Follow-Up:Pharmacist Review  Charlann Lange, [place] Pharmacist Assistant [phone]

## 2020-11-19 DIAGNOSIS — K219 Gastro-esophageal reflux disease without esophagitis: Secondary | ICD-10-CM | POA: Diagnosis not present

## 2020-11-19 DIAGNOSIS — E1165 Type 2 diabetes mellitus with hyperglycemia: Secondary | ICD-10-CM | POA: Diagnosis not present

## 2020-11-19 DIAGNOSIS — I10 Essential (primary) hypertension: Secondary | ICD-10-CM | POA: Diagnosis not present

## 2020-11-21 DIAGNOSIS — H34832 Tributary (branch) retinal vein occlusion, left eye, with macular edema: Secondary | ICD-10-CM | POA: Diagnosis not present

## 2020-11-28 ENCOUNTER — Other Ambulatory Visit: Payer: Self-pay

## 2020-11-28 DIAGNOSIS — I10 Essential (primary) hypertension: Secondary | ICD-10-CM

## 2020-11-28 MED ORDER — PANTOPRAZOLE SODIUM 40 MG PO TBEC: DELAYED_RELEASE_TABLET | ORAL | 1 refills | Status: DC

## 2020-11-28 MED ORDER — VERAPAMIL HCL 40 MG PO TABS: 40.0000 mg | ORAL_TABLET | Freq: Two times a day (BID) | ORAL | 1 refills | Status: DC

## 2020-12-02 ENCOUNTER — Other Ambulatory Visit: Payer: Self-pay | Admitting: Nurse Practitioner

## 2020-12-02 ENCOUNTER — Other Ambulatory Visit: Payer: Self-pay | Admitting: Oncology

## 2020-12-02 DIAGNOSIS — E1142 Type 2 diabetes mellitus with diabetic polyneuropathy: Secondary | ICD-10-CM

## 2020-12-02 DIAGNOSIS — D0511 Intraductal carcinoma in situ of right breast: Secondary | ICD-10-CM

## 2020-12-04 NOTE — Telephone Encounter (Signed)
Please review and send last seen 6/22 and next 9/22

## 2020-12-05 ENCOUNTER — Other Ambulatory Visit: Payer: Self-pay

## 2020-12-05 NOTE — Telephone Encounter (Signed)
Med sent to pharmacy.

## 2020-12-06 IMAGING — MG DIGITAL DIAGNOSTIC UNILATERAL RIGHT MAMMOGRAM WITH TOMO AND CAD
4 series · 4 of 12 positions shown · non-contrast
Comparison: Previous exam(s).

CLINICAL DATA: 73-year-old patient recalled from recent screening
mammogram for evaluation of a right breast mass. Patient has a
personal history of a right breast papilloma that was excised.

EXAM:
DIGITAL DIAGNOSTIC RIGHT MAMMOGRAM WITH CAD AND TOMO
ULTRASOUND RIGHT BREAST

[R CC synth-2D]
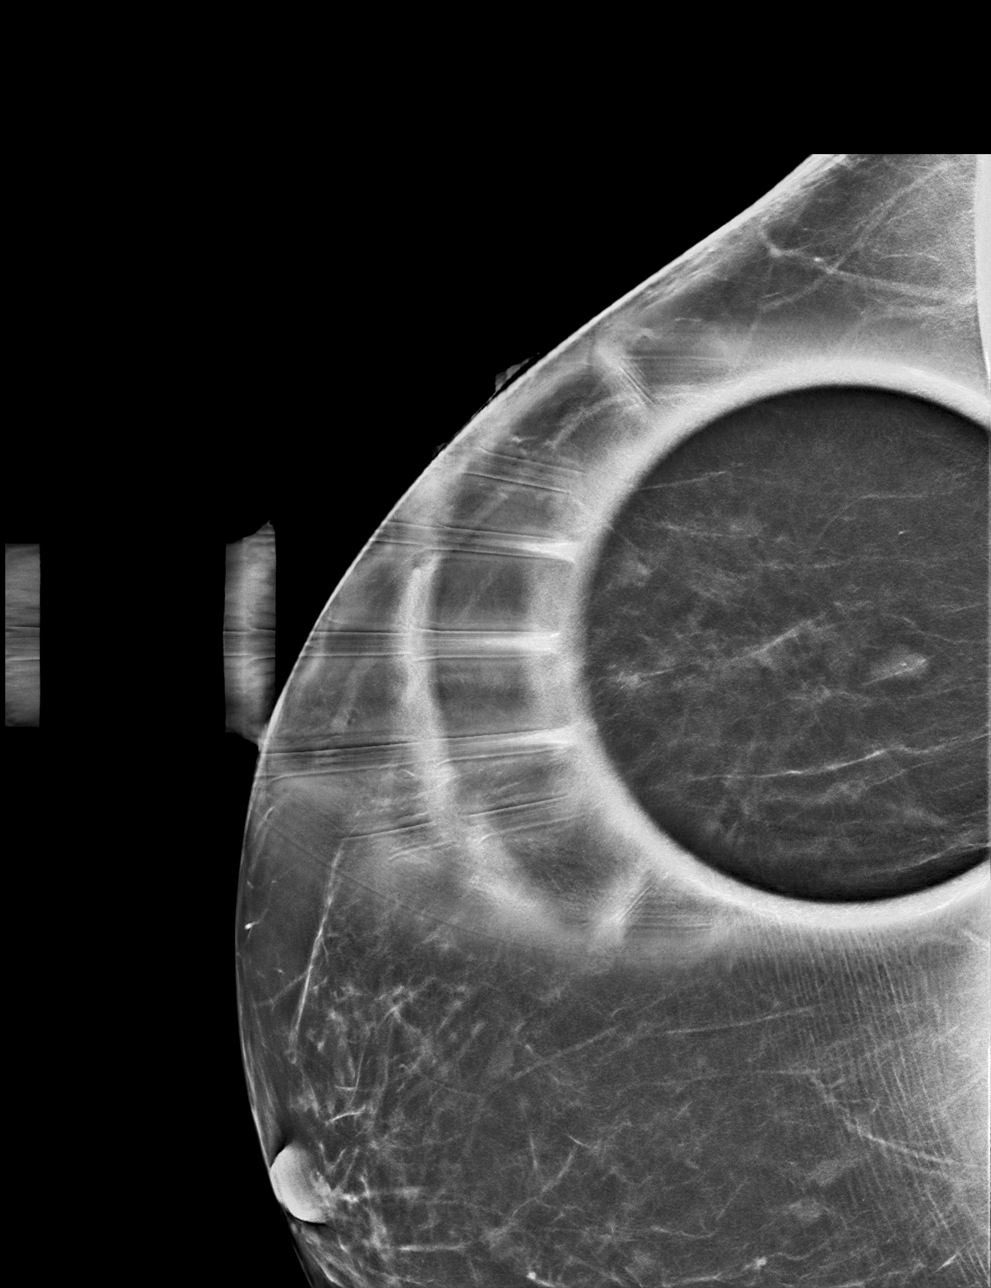

[R MLO synth-2D]
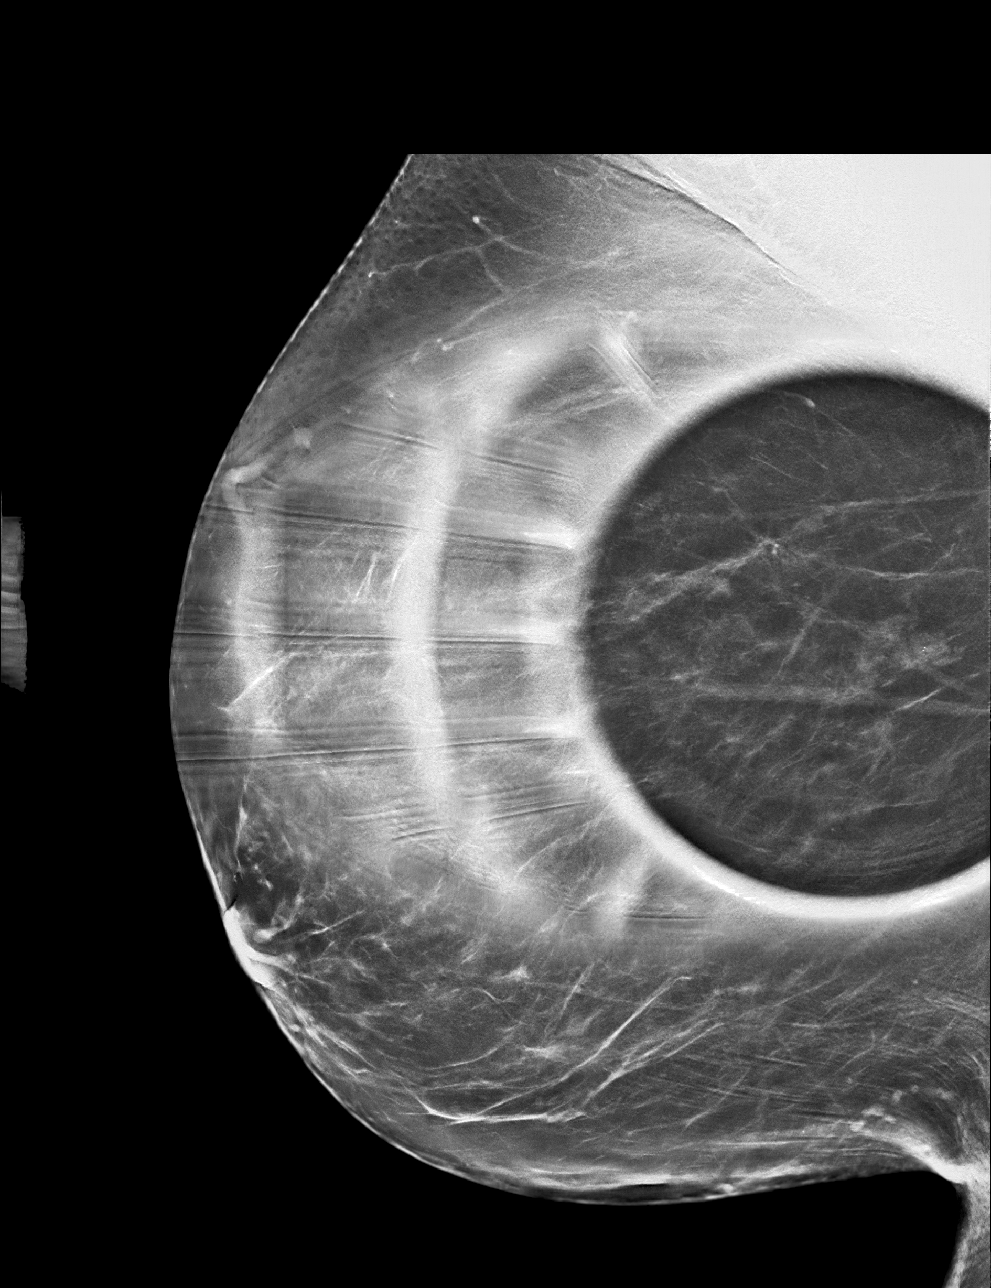

[R MLO tomo · tomo slice 39/76.0]
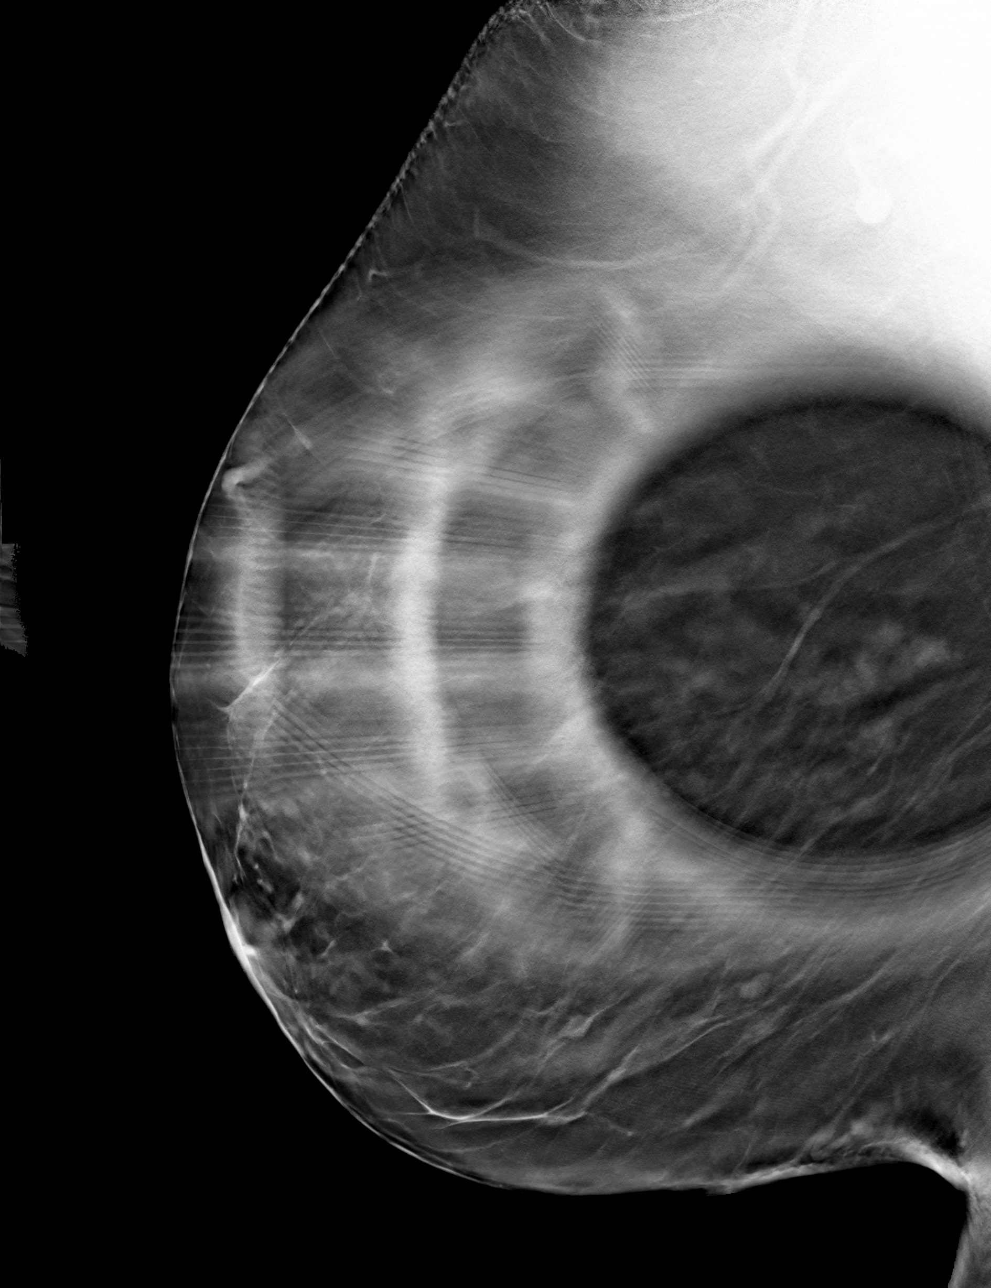

[R CC tomo · tomo slice 36/71.0]
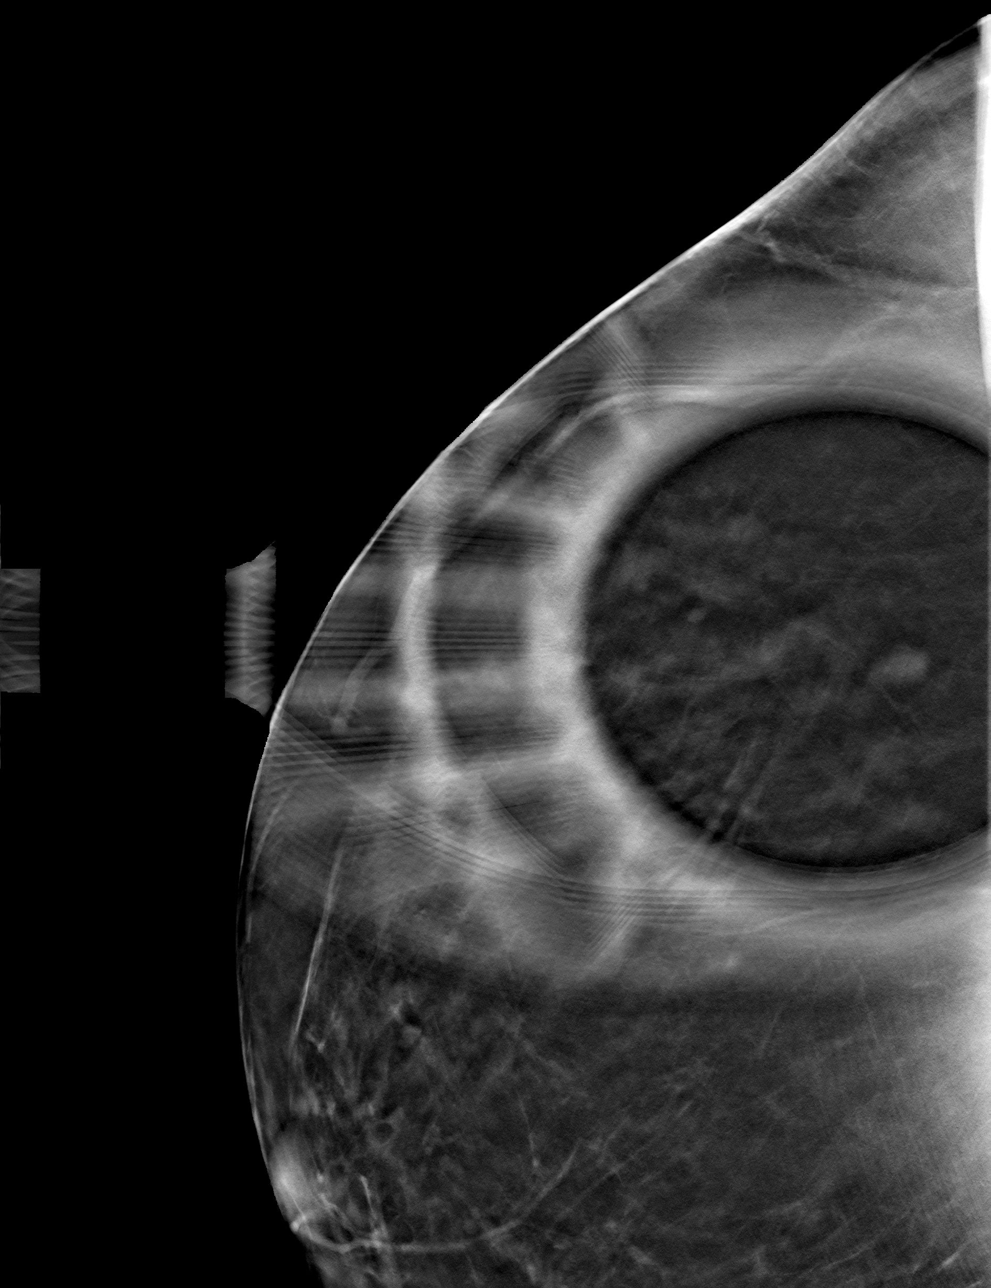

[4 of 12 positions shown; findings below may reference images not displayed]

ACR Breast Density Category b: There are scattered areas of
fibroglandular density.
FINDINGS: Additional mammographic views confirm a lobulated mass in the
posterior third of the outer right breast. The mass measures 9 mm
and contains a central calcification.

Mammographic images were processed with CAD.

Targeted ultrasound is performed, showing an oval hypoechoic mass
with partially circumscribed and partially indistinct margins in the
[DATE] position right breast 9 cm from the nipple. A small central
echogenic focus likely reflects a calcifications seen on
mammography. There is mild posterior acoustic enhancement. No
internal vascular flow identified. Ultrasound of the right axilla is
negative for lymphadenopathy.
IMPRESSION: Indeterminate 9 mm mass in the [DATE] position of the right breast.

RECOMMENDATION:
Ultrasound-guided core needle biopsy for pathologic diagnosis is
recommended.

I have discussed the findings and recommendations with the patient.
Results were also provided in writing at the conclusion of the
visit. If applicable, a reminder letter will be sent to the patient
regarding the next appointment.

BI-RADS CATEGORY  4: Suspicious.

## 2020-12-06 IMAGING — US ULTRASOUND RIGHT BREAST LIMITED
1 series · 8 of 8 positions shown · non-contrast
Comparison: Previous exam(s).

CLINICAL DATA: 73-year-old patient recalled from recent screening
mammogram for evaluation of a right breast mass. Patient has a
personal history of a right breast papilloma that was excised.

EXAM:
DIGITAL DIAGNOSTIC RIGHT MAMMOGRAM WITH CAD AND TOMO
ULTRASOUND RIGHT BREAST

[Series 1: ultrasound right breast limited · 0.08mm/px · 8 of 8 slices shown]
[im 1/8]
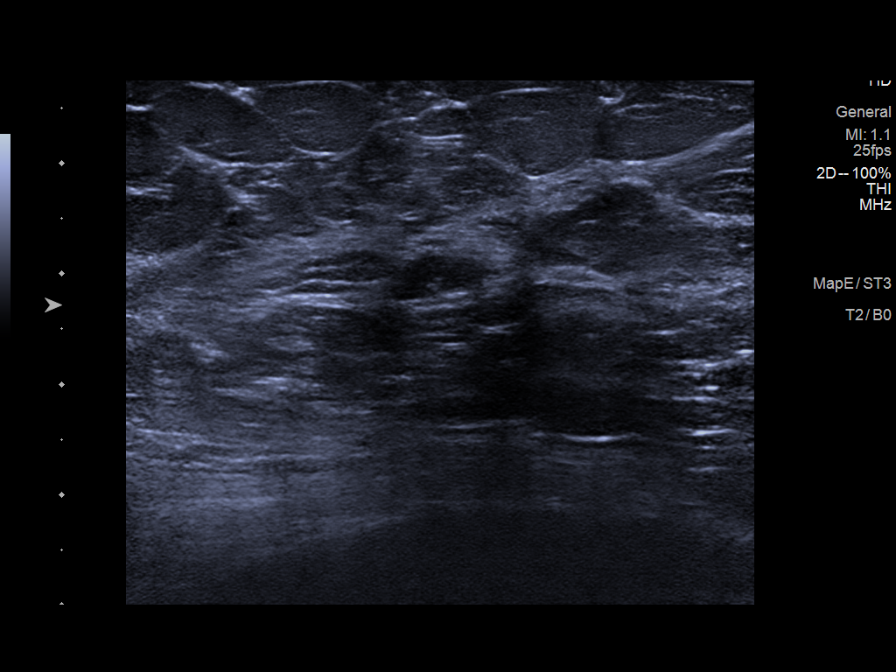
[im 2/8]
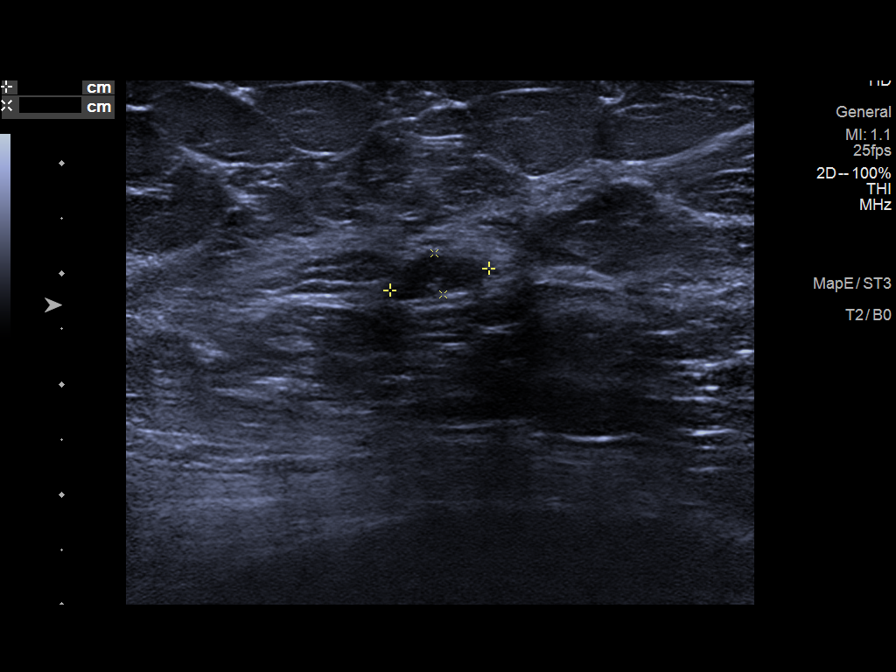
[im 3/8]
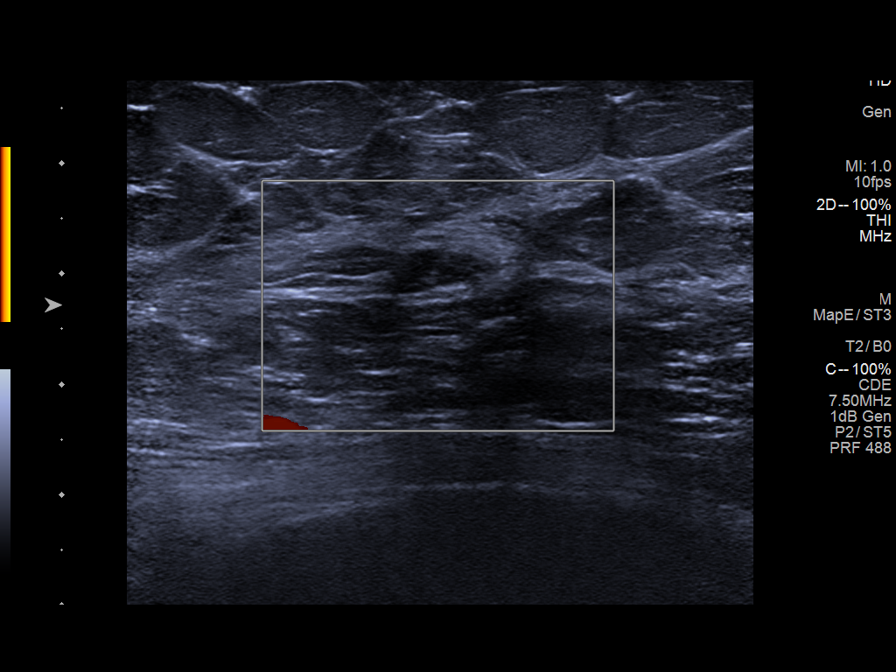
[im 4/8]
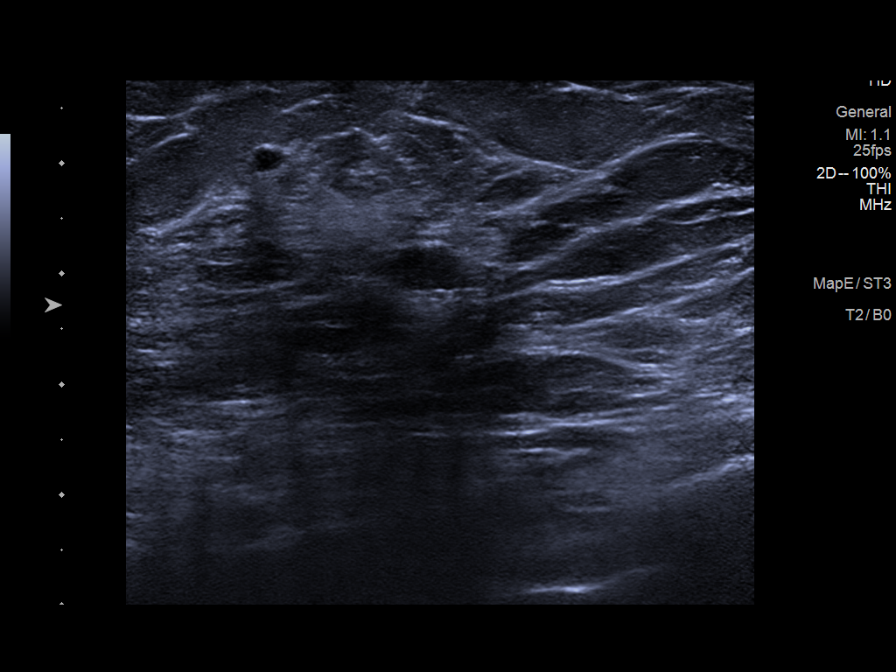
[im 5/8]
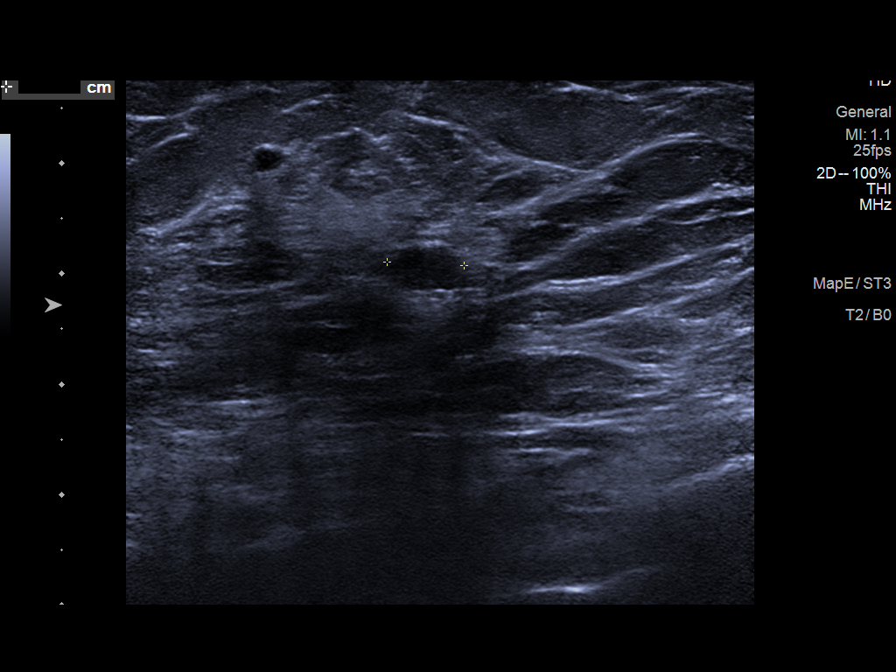
[im 6/8]
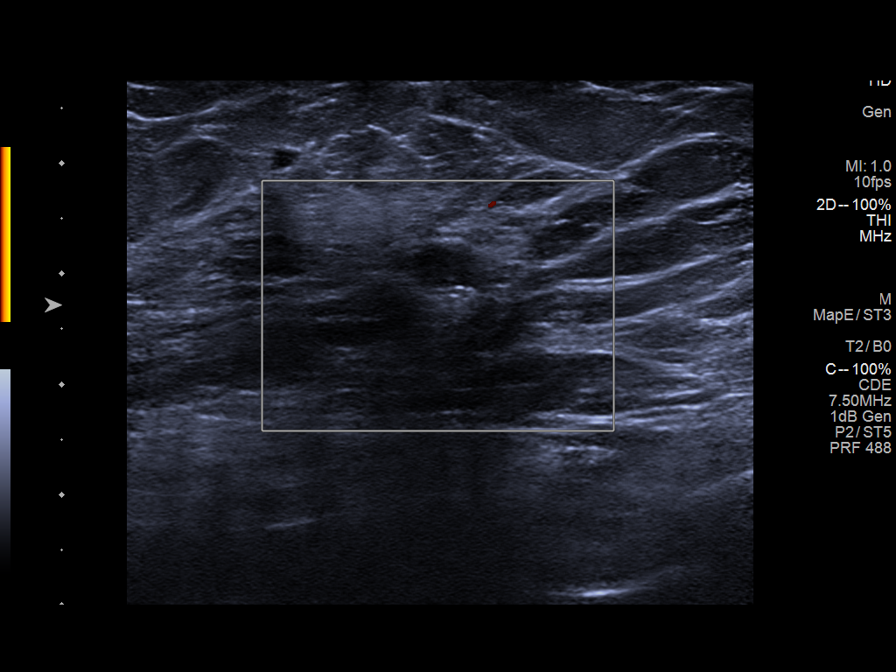
[im 7/8]
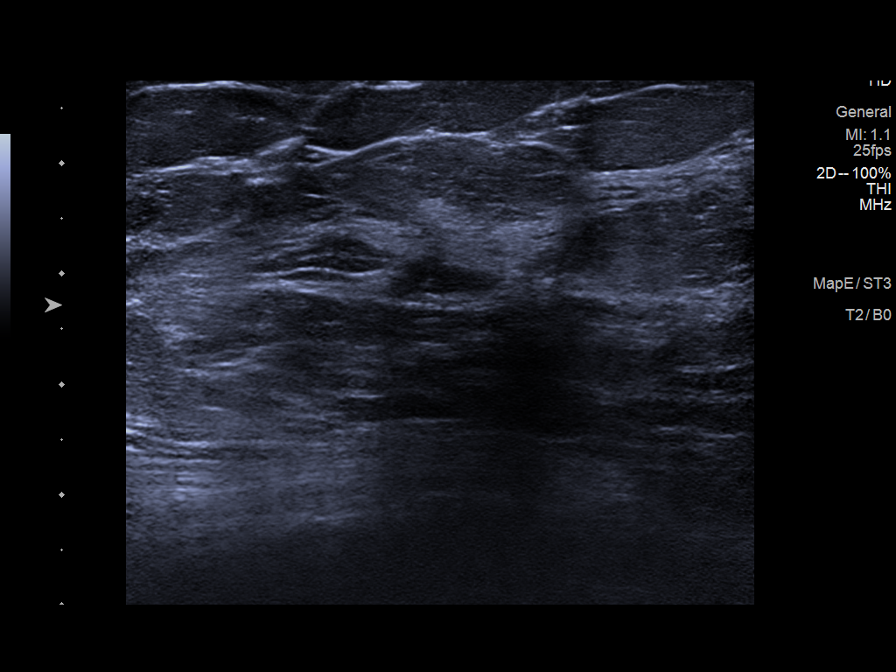
[im 8/8]
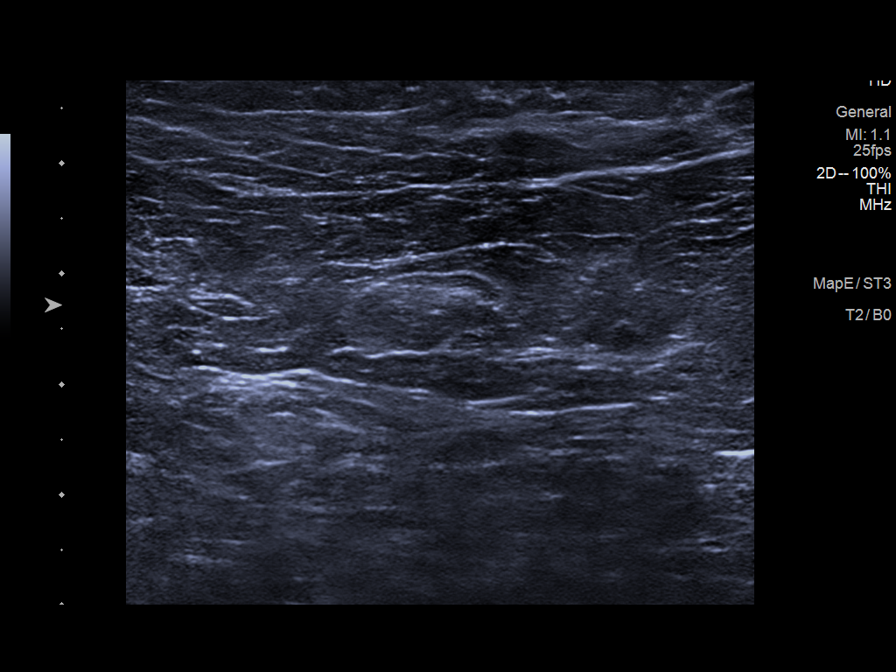

[8 of 8 positions shown; findings below may reference images not displayed]

ACR Breast Density Category b: There are scattered areas of
fibroglandular density.
FINDINGS: Additional mammographic views confirm a lobulated mass in the
posterior third of the outer right breast. The mass measures 9 mm
and contains a central calcification.

Mammographic images were processed with CAD.

Targeted ultrasound is performed, showing an oval hypoechoic mass
with partially circumscribed and partially indistinct margins in the
[DATE] position right breast 9 cm from the nipple. A small central
echogenic focus likely reflects a calcifications seen on
mammography. There is mild posterior acoustic enhancement. No
internal vascular flow identified. Ultrasound of the right axilla is
negative for lymphadenopathy.
IMPRESSION: Indeterminate 9 mm mass in the [DATE] position of the right breast.

RECOMMENDATION:
Ultrasound-guided core needle biopsy for pathologic diagnosis is
recommended.

I have discussed the findings and recommendations with the patient.
Results were also provided in writing at the conclusion of the
visit. If applicable, a reminder letter will be sent to the patient
regarding the next appointment.

BI-RADS CATEGORY  4: Suspicious.

## 2020-12-19 ENCOUNTER — Other Ambulatory Visit: Payer: Self-pay

## 2020-12-19 ENCOUNTER — Ambulatory Visit: Payer: Medicare HMO | Admitting: Dermatology

## 2020-12-19 DIAGNOSIS — L820 Inflamed seborrheic keratosis: Secondary | ICD-10-CM

## 2020-12-19 DIAGNOSIS — L120 Bullous pemphigoid: Secondary | ICD-10-CM | POA: Diagnosis not present

## 2020-12-19 MED ORDER — MUPIROCIN 2 % EX OINT: TOPICAL_OINTMENT | CUTANEOUS | 0 refills | Status: DC

## 2020-12-19 MED ORDER — CLOBETASOL PROPIONATE 0.05 % EX OINT: TOPICAL_OINTMENT | CUTANEOUS | 1 refills | Status: DC

## 2020-12-19 NOTE — Progress Notes (Signed)
Follow-Up Visit   Subjective  Kara [name] is a 75 y.o. female who presents for the following: Follow-up (4 mo bp follow up. Pt states that she still is getting bumps but not as many. She has some new ones on her arms and neck to check today. ).  Pt also believes that she may have a wart on the back on her right knee and L arm that she picks at.    The following portions of the chart were reviewed this encounter and updated as appropriate:      Review of Systems: No other skin or systemic complaints except as noted in HPI or Assessment and Plan.   Objective  Well appearing patient in no apparent distress; mood and affect are within normal limits.  A focused examination was performed including arms, legs, neck, back. Relevant physical exam findings are noted in the Assessment and Plan.  Right Thigh - Anterior Scattered pink macules and crusted papules on lower legs Vesicle on left pretibial Crusted plaque left ankle (h/o recent trauma from fall to area) Crusted papules on frontal scalp, posterior neck, arms and lower back    Right Popliteal x2, left forearm x 1 (3) Erythematous keratotic or waxy stuck-on papule  Assessment & Plan  Bullous pemphigoid Right Thigh - Anterior  Chronic condition- not clear, but controlled to pt satisfaction. Itching has improved and she is getting fewer lesions. She prefers to avoid immunosuppressive medications like prednisone  Start mupirocin ointment. Apply to sore on left ankle daily and keep covered. May use on any open sores.   Continue Clobetasol spot treat to new, active lesions BID. Avoid applying to face, groin, and axilla. Use as directed. Risk of skin atrophy with long-term use reviewed.   Continue triamcinolone 0.1% cream to itchy areas body qd/bid.  Continue Doxycycline 100 mg PO BID.   Topical steroids (such as triamcinolone, fluocinolone, fluocinonide, mometasone, clobetasol, halobetasol, betamethasone, hydrocortisone) can  cause thinning and lightening of the skin if they are used for too long in the same area. Your physician has selected the right strength medicine for your problem and area affected on the body. Please use your medication only as directed by your physician to prevent side effects.   Doxycycline should be taken with food to prevent nausea. Do not lay down for 30 minutes after taking. Be cautious with sun exposure and use good sun protection while on this medication. Pregnant women should not take this medication.     mupirocin ointment (BACTROBAN) 2 % - Right Thigh - Anterior Apply to open wounds and cover 1-2 times daily until healed.  clobetasol ointment (TEMOVATE) 0.05 % - Right Thigh - Anterior Apply to affected areas twice daily. Avoid applying to face, groin, and axilla. Use as directed. Risk of skin atrophy with long-term use reviewed.  Related Medications clobetasol cream (TEMOVATE) 0.05 % Apply to more severely affected areas of rash twice a day until improved. Avoid face, groin, underarms.  doxycycline (MONODOX) 100 MG capsule Take 1 capsule (100 mg total) by mouth 2 (two) times daily. Take with food.  Inflamed seborrheic keratosis Right Popliteal x2, left forearm x 1  Prior to procedure, discussed risks of blister formation, small wound, skin dyspigmentation, or rare scar following cryotherapy. Recommend Vaseline ointment to treated areas while healing.   Destruction of lesion - Right Popliteal x2, left forearm x 1  Destruction method: cryotherapy   Informed consent: discussed and consent obtained   Lesion destroyed using liquid nitrogen: Yes  Region frozen until ice ball extended beyond lesion: Yes   Outcome: patient tolerated procedure well with no complications   Post-procedure details: wound care instructions given    Return in about 4 months (around 04/20/2021) for bullous pemphigoid f/u.  I, Harriett Sine, CMA, am acting as scribe for [name],  MD.  Documentation: I have reviewed the above documentation for accuracy and completeness, and I agree with the above.  [name] MD

## 2020-12-26 ENCOUNTER — Other Ambulatory Visit: Payer: Self-pay | Admitting: Internal Medicine

## 2020-12-26 DIAGNOSIS — M159 Polyosteoarthritis, unspecified: Secondary | ICD-10-CM

## 2020-12-28 ENCOUNTER — Telehealth: Payer: Self-pay

## 2020-12-28 ENCOUNTER — Other Ambulatory Visit: Payer: Self-pay | Admitting: Nurse Practitioner

## 2020-12-28 NOTE — Telephone Encounter (Signed)
Pt advised that we send keep follow up

## 2020-12-30 ENCOUNTER — Other Ambulatory Visit: Payer: Self-pay | Admitting: Nurse Practitioner

## 2020-12-30 DIAGNOSIS — E1142 Type 2 diabetes mellitus with diabetic polyneuropathy: Secondary | ICD-10-CM

## 2021-01-05 ENCOUNTER — Telehealth: Payer: Self-pay

## 2021-01-12 ENCOUNTER — Ambulatory Visit (INDEPENDENT_AMBULATORY_CARE_PROVIDER_SITE_OTHER): Payer: Medicare HMO | Admitting: Nurse Practitioner

## 2021-01-12 ENCOUNTER — Other Ambulatory Visit: Payer: Self-pay

## 2021-01-12 ENCOUNTER — Encounter: Payer: Self-pay | Admitting: Nurse Practitioner

## 2021-01-12 VITALS — BP 130/70 | HR 60 | Temp 98.1°F | Resp 16 | Ht 64.0 in | Wt 208.8 lb

## 2021-01-12 DIAGNOSIS — N184 Chronic kidney disease, stage 4 (severe): Secondary | ICD-10-CM | POA: Diagnosis not present

## 2021-01-12 DIAGNOSIS — I10 Essential (primary) hypertension: Secondary | ICD-10-CM | POA: Diagnosis not present

## 2021-01-12 DIAGNOSIS — E1142 Type 2 diabetes mellitus with diabetic polyneuropathy: Secondary | ICD-10-CM

## 2021-01-12 DIAGNOSIS — Z794 Long term (current) use of insulin: Secondary | ICD-10-CM

## 2021-01-12 DIAGNOSIS — M064 Inflammatory polyarthropathy: Secondary | ICD-10-CM

## 2021-01-12 DIAGNOSIS — M25552 Pain in left hip: Secondary | ICD-10-CM | POA: Diagnosis not present

## 2021-01-12 DIAGNOSIS — E1122 Type 2 diabetes mellitus with diabetic chronic kidney disease: Secondary | ICD-10-CM | POA: Diagnosis not present

## 2021-01-12 DIAGNOSIS — M25551 Pain in right hip: Secondary | ICD-10-CM | POA: Diagnosis not present

## 2021-01-12 DIAGNOSIS — I700 Atherosclerosis of aorta: Secondary | ICD-10-CM

## 2021-01-12 DIAGNOSIS — E782 Mixed hyperlipidemia: Secondary | ICD-10-CM

## 2021-01-12 LAB — POCT GLYCOSYLATED HEMOGLOBIN (HGB A1C): Hemoglobin A1C: 6.9 % — AB (ref 4.0–5.6)

## 2021-01-12 MED ORDER — METHOCARBAMOL 500 MG PO TABS: 500.0000 mg | ORAL_TABLET | Freq: Three times a day (TID) | ORAL | 0 refills | Status: DC | PRN

## 2021-01-12 NOTE — Progress Notes (Signed)
[facility] [place], [place] [address]  Internal MEDICINE  Office Visit Note  Patient Name: Kara [name]  [id number]  [id number]  Date of Service: 01/25/2021  Chief Complaint  Patient presents with   Follow-up   Diabetes   Hyperlipidemia   Hypertension   Anemia   Quality Metric Gaps    Tdap, shingrix    HPI Kara [name] presents for a follow up visit for diabetes and A1C. She reports that she fell in [facility] in April and she has had hip pain ever since. She also has some arthritis.  She is due for tetanus booster vaccine but has declined it. Her A1C was 6.9 today which is up from 6.8 in June 2022. She is currently taking 1.5 mg of trulicity weekly and she continues to take levemir and sliding scale novolog as well. Her diet has not been ideal and she is aware of the diet modifications she needs to make.  Her blood pressure was elevated at her last office visit but is stable today on her current medications.    Current Medication: Outpatient Encounter Medications as of 01/12/2021  Medication Sig   Aflibercept (EYLEA) 2 MG/0.05ML SOLN    APPLE CIDER VINEGAR PO Take 2 tablets by mouth daily.    aspirin EC 81 MG tablet Take 81 mg by mouth daily.    cholecalciferol (VITAMIN D) 1000 UNITS tablet Take 2,000 Units by mouth daily.    clobetasol cream (TEMOVATE) 0.05 % Apply to more severely affected areas of rash twice a day until improved. Avoid face, groin, underarms.   clobetasol ointment (TEMOVATE) 0.05 % Apply to affected areas twice daily. Avoid applying to face, groin, and axilla. Use as directed. Risk of skin atrophy with long-term use reviewed.   doxycycline (MONODOX) 100 MG capsule Take 1 capsule (100 mg total) by mouth 2 (two) times daily. Take with food.   Dulaglutide (TRULICITY) 1.5 AL/9.3XT SOPN Inject 1.5 mg into the skin once a week.   Ferrous Gluconate 324 (37.5 Fe) MG TABS Take 324 mg by mouth daily.   furosemide (LASIX) 20 MG tablet Take 20 mg by  mouth 2 (two) times daily.   hydrOXYzine (ATARAX/VISTARIL) 10 MG tablet TAKE 1-3 TABLETS BY MOUTH EVERY NIGHT AS NEEDED FOR ITCH.   insulin detemir (LEVEMIR FLEXTOUCH) 100 UNIT/ML FlexPen INJECT UP TO 75 UNITS UNDER THE SKIN DAILY   Lactobacillus (PROBIOTIC ACIDOPHILUS) CAPS Take 1 capsule by mouth daily.   lisinopril (ZESTRIL) 10 MG tablet Take 10 mg by mouth daily.   Multiple Vitamin (MULTIVITAMIN) tablet Take 1 tablet by mouth daily at 12 noon.   mupirocin ointment (BACTROBAN) 2 % Apply to open wounds and cover 1-2 times daily until healed.   NOVOLOG FLEXPEN 100 UNIT/ML FlexPen INJECT [place] TWICE DAILY AS DIRECTED PER SLIDING SCALE. MAX DAILY DOSE IS 30 UNITS. DX E11.65.   pantoprazole (PROTONIX) 40 MG tablet TAKE 1 TABLET(S) BY MOUTH DAILY   pregabalin (LYRICA) 100 MG capsule TAKE 1 CAPSULE BY MOUTH TWICE A DAY   rosuvastatin (CRESTOR) 20 MG tablet TAKE 1 TABLET BY MOUTH EVERYDAY AT BEDTIME   tamoxifen (NOLVADEX) 20 MG tablet TAKE 1 TABLET BY MOUTH EVERY DAY   traMADol-acetaminophen (ULTRACET) 37.5-325 MG tablet TAKE 1 TABLET BY MOUTH 2 (TWO) TIMES DAILY AS NEEDED FOR MODERATE PAIN.   triamcinolone (KENALOG) 0.025 % cream Apply 1 application topically 2 (two) times daily.   triamcinolone (KENALOG) 0.1 % Apply 1 application topically 2 (two) times daily as needed (  Rash). Avoid face, groin, underarms.   verapamil (CALAN) 40 MG tablet Take 1 tablet (40 mg total) by mouth 2 (two) times daily.   [DISCONTINUED] methocarbamol (ROBAXIN) 500 MG tablet    methocarbamol (ROBAXIN) 500 MG tablet Take 1 tablet (500 mg total) by mouth every 8 (eight) hours as needed for muscle spasms.   No facility-administered encounter medications on file as of 01/12/2021.    Surgical History: Past Surgical History:  Procedure Laterality Date   BILATERAL SALPINGECTOMY     BREAST BIOPSY Right 2014   neg- core   BREAST BIOPSY Right 12/21/2018   [place] bx, venus clip,  DUCTAL CARCINOMA IN SITU   BREAST BIOPSY Right  12/21/2018   [place] bx, ribbon clip,  FIBROEPITHELIAL PROLIFERATION WITH SCLEROSIS   BREAST BIOPSY Right 01/01/2019   Affirm bx "X" clip-path pending   BREAST DUCTAL SYSTEM EXCISION     BREAST EXCISIONAL BIOPSY Right 2014   neg   BREAST LUMPECTOMY Right 01/22/2019   path pending   CATARACT EXTRACTION     CHOLECYSTECTOMY     CYSTOSCOPY WITH STENT PLACEMENT Left 04/14/2015   Procedure: CYSTOSCOPY WITH STENT PLACEMENT;  Surgeon: Hollice Espy, MD;  Location: [facility];  Service: Urology;  Laterality: Left;   EXTRACORPOREAL SHOCK WAVE LITHOTRIPSY Left 09/29/2014   Procedure: EXTRACORPOREAL SHOCK WAVE LITHOTRIPSY (ESWL);  Surgeon: Hollice Espy, MD;  Location: [facility];  Service: Urology;  Laterality: Left;   EYE SURGERY     bilateral cataract   IRRIGATION AND DEBRIDEMENT ABSCESS Right 03/24/2019   Procedure: IRRIGATION AND DEBRIDEMENT ABSCESS RIGHT BREAST;  Surgeon: Herbert Pun, MD;  Location: [facility];  Service: General;  Laterality: Right;   LAPAROSCOPIC OOPHERECTOMY Left    PARTIAL MASTECTOMY WITH NEEDLE LOCALIZATION Right 01/22/2019   Procedure: PARTIAL MASTECTOMY WITH NEEDLE LOCALIZATION;  Surgeon: Herbert Pun, MD;  Location: [facility];  Service: General;  Laterality: Right;   TONSILLECTOMY     TUBAL LIGATION     URETEROSCOPY WITH HOLMIUM LASER LITHOTRIPSY Left 04/14/2015   Procedure: URETEROSCOPY WITH HOLMIUM LASER LITHOTRIPSY;  Surgeon: Hollice Espy, MD;  Location: [facility];  Service: Urology;  Laterality: Left;    Medical History: Past Medical History:  Diagnosis Date   Anemia    Arthropathy    Benign neoplasm of breast    Breast cancer ([place])    Chest pain    Chronic kidney disease    30% kidney function   Diabetes ([place])    Diabetic retinopathy ([place])    Dyspnea    Edema    History of kidney stones    Hyperlipemia    Hypersomnia with sleep apnea    Hypertension    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HCC)    Lumbago    Osteoarthrosis    Ovarian failure     Personal history of radiation therapy    SOB (shortness of breath)     Family History: Family History  Problem Relation Age of Onset   Breast cancer Maternal Grandmother 88   Colon cancer Mother    Lung cancer Father     Social History   Socioeconomic History   Marital status: Married    Spouse name: Not on file   Number of children: Not on file   Years of education: Not on file   Highest education level: Not on file  Occupational History   Not on file  Tobacco Use   Smoking status: Former    Packs/day: 1.00    Years: 30.00  Pack years: 30.00    Types: Cigarettes    Quit date: 04/11/1995    Years since quitting: 25.8   Smokeless tobacco: Never  Vaping Use   Vaping Use: Never used  Substance and Sexual Activity   Alcohol use: No    Alcohol/week: 0.0 standard drinks   Drug use: No   Sexual activity: Not on file  Other Topics Concern   Not on file  Social History Narrative   Not on file   Social Determinants of Health   Financial Resource Strain: Low Risk    Difficulty of Paying Living Expenses: Not very hard  Food Insecurity: Not on file  Transportation Needs: Not on file  Physical Activity: Not on file  Stress: Not on file  Social Connections: Not on file  Intimate Partner Violence: Not on file      Review of Systems  Constitutional:  Negative for chills, fatigue and unexpected weight change.  HENT:  Negative for congestion, rhinorrhea, sneezing and sore throat.   Eyes:  Negative for redness.  Respiratory:  Negative for cough, chest tightness and shortness of breath.   Cardiovascular:  Negative for chest pain and palpitations.  Gastrointestinal:  Negative for abdominal pain, constipation, diarrhea, nausea and vomiting.  Genitourinary:  Negative for dysuria and frequency.  Musculoskeletal:  Positive for arthralgias and back pain. Negative for joint swelling and neck pain.  Skin:  Negative for rash.  Neurological: Negative.  Negative for tremors  and numbness.  Hematological:  Negative for adenopathy. Does not bruise/bleed easily.  Psychiatric/Behavioral:  Negative for behavioral problems (Depression), self-injury, sleep disturbance and suicidal ideas. The patient is not nervous/anxious.    Vital Signs: BP 130/70   Pulse 60   Temp 98.1 F (36.7 C)   Resp 16   Ht 5\' 4"  (1.626 m)   Wt 208 lb 12.8 oz (94.7 kg)   SpO2 98%   BMI 35.84 kg/m    Physical Exam Vitals reviewed.  Constitutional:      General: She is not in acute distress.    Appearance: Normal appearance. She is obese. She is not ill-appearing.  HENT:     Head: Normocephalic and atraumatic.  Eyes:     Extraocular Movements: Extraocular movements intact.     Pupils: Pupils are equal, round, and reactive to light.  Cardiovascular:     Rate and Rhythm: Normal rate and regular rhythm.  Pulmonary:     Effort: Pulmonary effort is normal. No respiratory distress.  Neurological:     Mental Status: She is alert and oriented to person, place, and time.     Cranial Nerves: No cranial nerve deficit.     Coordination: Coordination normal.     Gait: Gait normal.  Psychiatric:        Mood and Affect: Mood normal.        Behavior: Behavior normal.       Assessment/Plan: 1. Type 2 diabetes mellitus with stage 4 chronic kidney disease, with long-term current use of insulin (HCC) No significant change in A1C. Patient is working on diet modifications. Consider increasing trulicity dose at next office visit.  - POCT glycosylated hemoglobin (Hb A1C)  2. Hip pain, bilateral Hip pain due to fall in April, bothering her more now, methocarbamol prescribed.  - methocarbamol (ROBAXIN) 500 MG tablet; Take 1 tablet (500 mg total) by mouth every 8 (eight) hours as needed for muscle spasms.  Dispense: 60 tablet; Refill: 0  3. Essential hypertension Stable on current medications, continue as  prescribed.   4. Inflammatory polyarthropathy (HCC) History of poly arthritis, taking  lyrica twice daily and ultracet as needed.   5. Atherosclerosis of aorta (HCC) Taking rosuvastatin 20 mg daily. Last echocardiogram was done in July 2021. No record of carotid ultrasound. Consider order for US carotid bilateral duplex at next office visit.    General Counseling: Kara [name] understanding of the findings of todays visit and agrees with plan of treatment. I have discussed any further diagnostic evaluation that may be needed or ordered today. We also reviewed her medications today. she has been encouraged to call the office with any questions or concerns that should arise related to todays visit.    Orders Placed This Encounter  Procedures   POCT glycosylated hemoglobin (Hb A1C)    Meds ordered this encounter  Medications   methocarbamol (ROBAXIN) 500 MG tablet    Sig: Take 1 tablet (500 mg total) by mouth every 8 (eight) hours as needed for muscle spasms.    Dispense:  60 tablet    Refill:  0    Return in about 3 months (around 04/13/2021) for F/U, Recheck A1C, Kara [name] PCP.   Total time spent:20 Minutes Time spent includes review of chart, medications, test results, and follow up plan with the patient.   [place] Controlled Substance Database was reviewed by me.  This patient was seen by Jonetta Osgood, FNP-C in collaboration with Dr. Clayborn Bigness as a part of collaborative care agreement.   Azarias Chiou R. Valetta Fuller, MSN, FNP-C Internal medicine

## 2021-01-16 DIAGNOSIS — H34832 Tributary (branch) retinal vein occlusion, left eye, with macular edema: Secondary | ICD-10-CM | POA: Diagnosis not present

## 2021-01-16 NOTE — Progress Notes (Signed)
Chronic Care Management Pharmacy Note  01/16/2021 Name:  Kara [name] MRN:  [id number] DOB:  Jun 22, 1945  Summary: Pharmd FU.  Patient continues to have morning lows.  Her afternoon sugars are closer to goal or slightly above.  She did decrease insulin to 60 units in the PM, however is now back up to 70 because sugars started to increase.  Recommendations/Changes made from today's visit: Could consider dosing her Levemir in the AM for more daytime coverage.  Plan: F/u 30 days with PharmD   Subjective: Kara [name] is an 75 y.o. year old female who is a primary patient of Humphrey Rolls, Timoteo Gaul, MD.  The CCM team was consulted for assistance with disease management and care coordination needs.    Engaged with patient by telephone for initial visit in response to provider referral for pharmacy case management and/or care coordination services.   Consent to Services:  The patient was given the following information about Chronic Care Management services today, agreed to services, and gave verbal consent: 1. CCM service includes personalized support from designated clinical staff supervised by the primary care provider, including individualized plan of care and coordination with other care providers 2. 24/7 contact phone numbers for assistance for urgent and routine care needs. 3. Service will only be billed when office clinical staff spend 20 minutes or more in a month to coordinate care. 4. Only one practitioner may furnish and bill the service in a calendar month. 5.The patient may stop CCM services at any time (effective at the end of the month) by phone call to the office staff. 6. The patient will be responsible for cost sharing (co-pay) of up to 20% of the service fee (after annual deductible is met). Patient agreed to services and consent obtained.  Patient Care Team: Lavera Guise, MD as PCP - General (Internal Medicine) Lloyd Huger, MD as Consulting Physician  (Oncology) Herbert Pun, MD as Consulting Physician (General Surgery) Noreene Filbert, MD as Referring Physician (Radiation Oncology) Edythe Clarity, [facility] as Pharmacist (Pharmacist)  Recent office visits:  12/2320 Kara Fuller, NP - FU for diabetes 07/03/20 Kara Ochoa, NP. For follow-up. No medication changes. 04/20/20 Kara Ochoa, NP. For follow up. STARTED Prednisone 10 mg dose pack.   Recent consult visits: 08/08/20 Jovita Kussmaul, MD. For bullous pemphigoid. Per note: Can take Zyrtec (Cetirizine) in the morning and Can use OTC arnica containing moisturizer such as Dermend Bruise Formula if desired. 08/01/20 Ophthalmology Isaias Sakai. No information given. 07/31/20 Nephrology Murlean Iba, MD For follow-up. No information given. 07/11/20 Oncology Lloyd Huger, MD. For follow-up. No mediation changes. 06/28/20 Jovita Kussmaul, MD. For bullous pemphigoid. Per note:Start OTC Zyrtec (Cetirizine) 10 mg PO in the morning for itch. 05/29/20 Jovita Kussmaul, MD. For bullous pemphigoid. STARTED Doxycycline 100 mg 2 times daily 05/22/20 Jovita Kussmaul, MD. For rash STARTED Triamcinolone 0.1% cream 2 times daily. 05/16/20 Ophthalmology Isaias Sakai. No information given. 05/15/20 Jovita Kussmaul, MD.For psoriasis. STARTED Clobetasol Propionate 0.05% apply dally and Hyfroxyzine 10 mg 1-3 tablets at night as needed. 04/04/20 Nephrology Murlean Iba, MD For follow-up. No medication Changes.   Hospital visits:  None in previous 6 months   Medication History: Rosuvastatin 20 mg 90 DS 07/27/20 Lisinopril 10 mg 30 DS 07/13/20    Objective:  Lab Results  Component Value Date   CREATININE 1.94 (H) 10/16/2020   BUN 35 (H) 10/16/2020   GFRNONAA 34 (L) 08/16/2019   GFRAA 39 (L)  08/16/2019   NA 139 10/16/2020   K 4.4 10/16/2020   CALCIUM 9.2 10/16/2020   CO2 22 10/16/2020   GLUCOSE 101 (H) 10/16/2020    Lab Results  Component Value Date/Time   HGBA1C 6.9 (A)  01/12/2021 10:45 AM   HGBA1C 6.8 (A) 10/09/2020 10:57 AM   HGBA1C 7.6 (H) 03/21/2019 08:12 PM    Last diabetic Eye exam: No results found for: HMDIABEYEEXA  Last diabetic Foot exam: No results found for: HMDIABFOOTEX   Lab Results  Component Value Date   CHOL 144 10/16/2020   HDL 44 10/16/2020   LDLCALC 62 10/16/2020   TRIG 234 (H) 10/16/2020   CHOLHDL 3.3 10/16/2020    Hepatic Function Latest Ref Rng & Units 10/16/2020 08/16/2019 03/21/2019  Total Protein 6.0 - 8.5 g/dL 6.6 7.3 7.7  Albumin 3.7 - 4.7 g/dL 3.9 4.0 3.8  AST 0 - 40 IU/L 23 27 24   ALT 0 - 32 IU/L 19 25 21   Alk Phosphatase 44 - 121 IU/L 61 90 81  Total Bilirubin 0.0 - 1.2 mg/dL 0.3 0.3 1.3(H)    Lab Results  Component Value Date/Time   TSH 1.780 10/16/2020 08:31 AM   TSH 1.750 08/16/2019 10:35 AM   FREET4 1.25 10/16/2020 08:31 AM   FREET4 1.35 08/16/2019 10:35 AM    CBC Latest Ref Rng & Units 10/16/2020 08/16/2019 03/24/2019  WBC 3.4 - 10.8 x10E3/uL 6.2 5.5 11.2(H)  Hemoglobin 11.1 - 15.9 g/dL 11.1 11.9 9.1(L)  Hematocrit 34.0 - 46.6 % 35.0 36.3 27.7(L)  Platelets 150 - 450 x10E3/uL 146(L) 155 134(L)    Lab Results  Component Value Date/Time   VD25OH 44.1 10/16/2020 08:31 AM   VD25OH 37.7 08/16/2019 10:35 AM    Clinical ASCVD: No  The 10-year ASCVD risk score (Arnett DK, et al., 2019) is: 33.3%   Values used to calculate the score:     Age: 52 years     Sex: Female     Is Non-Hispanic African American: No     Diabetic: Yes     Tobacco smoker: No     Systolic Blood Pressure: 591 mmHg     Is BP treated: Yes     HDL Cholesterol: 44 mg/dL     Total Cholesterol: 144 mg/dL    Depression screen [facility] 2/9 01/12/2021 10/09/2020 07/03/2020  Decreased Interest 0 0 0  Down, Depressed, Hopeless 0 0 0  PHQ - 2 Score 0 0 0      Social History   Tobacco Use  Smoking Status Former   Packs/day: 1.00   Years: 30.00   Pack years: 30.00   Types: Cigarettes   Quit date: 04/11/1995   Years since quitting: 25.7   Smokeless Tobacco Never   BP Readings from Last 3 Encounters:  01/12/21 130/70  10/09/20 (!) 154/72  07/03/20 128/82   Pulse Readings from Last 3 Encounters:  01/12/21 60  10/09/20 62  07/03/20 62   Wt Readings from Last 3 Encounters:  01/12/21 208 lb 12.8 oz (94.7 kg)  10/09/20 211 lb 6.4 oz (95.9 kg)  07/03/20 207 lb 12.8 oz (94.3 kg)   BMI Readings from Last 3 Encounters:  01/12/21 35.84 kg/m  10/09/20 36.29 kg/m  07/03/20 35.67 kg/m    Assessment/Interventions: Review of patient past medical history, allergies, medications, health status, including review of consultants reports, laboratory and other test data, was performed as part of comprehensive evaluation and provision of chronic care management services.   SDOH:  (Social Determinants  of Health) assessments and interventions performed: Yes  Financial Resource Strain: Low Risk    Difficulty of Paying Living Expenses: Not very hard    SDOH Screenings   Alcohol Screen: Low Risk    Last Alcohol Screening Score (AUDIT): 0  Depression (PHQ2-9): Low Risk    PHQ-2 Score: 0  Financial Resource Strain: Low Risk    Difficulty of Paying Living Expenses: Not very hard  Food Insecurity: Not on file  Housing: Not on file  Physical Activity: Not on file  Social Connections: Not on file  Stress: Not on file  Tobacco Use: Medium Risk   Smoking Tobacco Use: Former   Smokeless Tobacco Use: Never  Transportation Needs: Not on file    [place]  No Known Allergies  Medications Reviewed Today     Reviewed by Jimmye Norman, CMA (Certified Medical Assistant) on 01/12/21 at [place] List Status: <None>   Medication Order Taking? Sig Documenting Provider Last Dose Status Informant  Aflibercept (EYLEA) 2 MG/0.05ML SOLN [id number]   [provider]  Active   APPLE CIDER VINEGAR PO [id number]  Take 2 tablets by mouth daily.  [provider]  Active Spouse/Significant Other  aspirin EC 81 MG  tablet [id number]  Take 81 mg by mouth daily.  [provider]  Active Spouse/Significant Other  cholecalciferol (VITAMIN D) 1000 UNITS tablet [id number]  Take 2,000 Units by mouth daily.  [provider]  Active Spouse/Significant Other  clobetasol cream (TEMOVATE) 0.05 % [id number]  Apply to more severely affected areas of rash twice a day until improved. Avoid face, groin, underarms. [name], MD  Active   clobetasol ointment (TEMOVATE) 0.05 % [id number]  Apply to affected areas twice daily. Avoid applying to face, groin, and axilla. Use as directed. Risk of skin atrophy with long-term use reviewed. [name], MD  Active   doxycycline (MONODOX) 100 MG capsule [id number]  Take 1 capsule (100 mg total) by mouth 2 (two) times daily. Take with food. Kara Ochoa, NP  Active   Dulaglutide (TRULICITY) 1.5 CH/8.8FO Bonney Aid [id number]  Inject 1.5 mg into the skin once a week. Lavera Guise, MD  Active   Ferrous Gluconate 324 (37.5 Fe) MG TABS [id number]  Take 324 mg by mouth 2 (two) times daily.  [provider]  Active Spouse/Significant Other  furosemide (LASIX) 20 MG tablet [id number]  Take 20 mg by mouth 2 (two) times daily. [provider]  Active   hydrOXYzine (ATARAX/VISTARIL) 10 MG tablet [id number]  TAKE 1-3 TABLETS BY MOUTH EVERY NIGHT AS NEEDED FOR ITCH. [name], MD  Active   insulin detemir (LEVEMIR FLEXTOUCH) 100 UNIT/ML FlexPen [id number]  INJECT UP TO 75 UNITS UNDER THE SKIN DAILY Jonetta Osgood, NP  Active   Lactobacillus (PROBIOTIC ACIDOPHILUS) CAPS [id number]  Take 1 capsule by mouth daily. [provider]  Active   lisinopril (ZESTRIL) 10 MG tablet [id number]  Take 10 mg by mouth daily. [provider]  Active   Multiple Vitamin (MULTIVITAMIN) tablet [id number]  Take 1 tablet by mouth daily at 12 noon. [provider]  Active Spouse/Significant Other  mupirocin ointment (BACTROBAN) 2 % [id number]  Apply to open wounds and  cover 1-2 times daily until healed. [name], MD  Active   NOVOLOG FLEXPEN 100 UNIT/ML FlexPen [id number]  INJECT [place] TWICE DAILY AS DIRECTED PER SLIDING SCALE. MAX DAILY DOSE IS 30 UNITS. DX E11.65. Lavera Guise, MD  Active   pantoprazole (PROTONIX) 40  MG tablet [id number]  TAKE 1 TABLET(S) BY MOUTH DAILY Abernathy, Alyssa, NP  Active   pregabalin (LYRICA) 100 MG capsule [id number]  TAKE 1 CAPSULE BY MOUTH TWICE A DAY Lavera Guise, MD  Active   rosuvastatin (CRESTOR) 20 MG tablet [id number]  TAKE 1 TABLET BY MOUTH EVERYDAY AT BEDTIME Lavera Guise, MD  Active   tamoxifen (NOLVADEX) 20 MG tablet [id number]  TAKE 1 TABLET BY MOUTH EVERY DAY Lloyd Huger, MD  Active   traMADol-acetaminophen (ULTRACET) 37.5-325 MG tablet [id number]  TAKE 1 TABLET BY MOUTH 2 (TWO) TIMES DAILY AS NEEDED FOR MODERATE PAIN. Jonetta Osgood, NP  Active   triamcinolone (KENALOG) 0.025 % cream [id number]  Apply 1 application topically 2 (two) times daily. Jonetta Osgood, NP  Active   triamcinolone (KENALOG) 0.1 % [id number]  Apply 1 application topically 2 (two) times daily as needed (Rash). Avoid face, groin, underarms. [name], MD  Active   verapamil (CALAN) 40 MG tablet [id number]  Take 1 tablet (40 mg total) by mouth 2 (two) times daily. Jonetta Osgood, NP  Active             Patient Active Problem List   Diagnosis Date Noted   Type 2 diabetes mellitus with stage 4 chronic kidney disease, with long-term current use of insulin ([place]) 10/17/2020   Synovial cyst of right popliteal space 12/18/2019   Encounter for general adult medical examination with abnormal findings 10/17/2019   Bilateral lower extremity edema 10/17/2019   Atopic dermatitis 10/17/2019   Chest pain 08/16/2019   Alopecia 08/16/2019   Fever 03/22/2019   Cellulitis of right breast 03/22/2019   CKD (chronic kidney disease), stage IV ([place]) 03/22/2019   Aortic atherosclerosis ([place]) 03/22/2019   GERD (gastroesophageal reflux disease)  03/21/2019   Rash 03/21/2019   Type 2 diabetes mellitus with hyperglycemia ([place]) 01/07/2019   Ductal carcinoma in situ (DCIS) of right breast 01/01/2019   Screening for breast cancer 10/03/2018   Dyspnea on exertion 10/03/2018   Dysuria 10/03/2018   Diabetic polyneuropathy associated with type 2 diabetes mellitus ([place]) 06/29/2018   Type 2 diabetes mellitus with diabetic neuropathy ([place]) 06/29/2018   Uncontrolled type 2 diabetes mellitus with hyperglycemia ([place]) 03/30/2018   Inflammatory polyarthropathy ([place]) 03/30/2018   Benign essential tremor 03/30/2018   Irritable bowel syndrome with diarrhea 12/07/2017   Essential hypertension 12/07/2017   Mixed hyperlipidemia 12/07/2017    Immunization History  Administered Date(s) Administered   Influenza, High Dose Seasonal PF 01/24/2017, 02/02/2018, 12/18/2018, 04/06/2019, 01/07/2020   Influenza-Unspecified 01/20/2014, 12/18/2018, 01/04/2021   Pneumococcal Polysaccharide-23 02/02/2018, 05/04/2018    Conditions to be addressed/monitored:  HTN, GERD, Type II DM w/ CKD, HLD, Breast Cancer  There are no care plans that you recently modified to display for this patient.    Medication Assistance: Application for Trulicity  medication assistance program. in process.  Anticipated assistance start date unknown.  See plan of care for additional detail.  Compliance/Adherence/Medication fill history: Care Gaps: Eye exam needed  Star-Rating Drugs: Rosuvastatin 20 mg 90 DS 07/27/20 Lisinopril 10 mg 30 DS 07/13/20  Patient's preferred pharmacy is:  [id number] [id number]: [phone]: [phone] [facility] [place] [id number]: [phone]: [phone] Uses pill box? Yes Pt endorses 100% compliance  We discussed: Benefits of  medication  synchronization, packaging and delivery as well as enhanced pharmacist oversight with [employer]. Patient decided to: Continue current medication management strategy  Care Plan and Follow Up Patient Decision:  Patient agrees to Care Plan and Follow-up.  Plan: The care management team will reach out to the patient again over the next 90 days.  Beverly Milch, PharmD Clinical Pharmacist [facility] [phone]    Current Barriers:  Unable to achieve control of fasting glucose   Pharmacist Clinical Goal(s):  Patient will achieve adherence to monitoring guidelines and medication adherence to achieve therapeutic efficacy achieve control of fasting glucose as evidenced by monitoring contact provider office for questions/concerns as evidenced notation of same in electronic health record through collaboration with PharmD and provider.   Interventions: 1:1 collaboration with Lavera Guise, MD regarding development and update of comprehensive plan of care as evidenced by provider attestation and co-signature Inter-disciplinary care team collaboration (see longitudinal plan of care) Comprehensive medication review performed; medication list updated in electronic medical record  Hypertension (BP goal <130/80) -Controlled -Current treatment: Verapamil 46m daily Lisinopril 183mdaily -Medications previously tried: hydralazine 1061mID  -Current home readings: 130/80 [facility] -Current dietary habits: tries to watch what she eats, eats chicken/fish, stir fry -Reports hypotensive/hypertensive symptoms including dizziness and fatigue -Educated on BP goals and benefits of medications for prevention of heart attack, stroke and kidney damage; Daily salt intake goal < 2300 mg; Exercise goal of 150 minutes per week; Importance of home blood pressure monitoring; Symptoms of hypotension and importance of maintaining adequate hydration; -Counseled to monitor BP at home a  few times per week, document, and provide log at future appointments -Recommended to continue current medication  Hyperlipidemia: (LDL goal < 70) -Not ideally controlled -Current treatment: Rosuvastatin [id number] -Medications previously tried: none noted  -Current dietary patterns: see above -Educated on Cholesterol goals;  Benefits of statin for ASCVD risk reduction; Importance of limiting foods high in cholesterol; Exercise goal of 150 minutes per week; -Recommended to continue current medication Recommended repeat lipid panel at next visit which is a physical with Alyssa NP  Update 01/19/21 Continues to take med with no adverse effects Most recent lipid panel in June was excellent.  Continue meds with no changes  Diabetes w/neuropathy (A1c goal <7%) -Controlled -Current medications: Novolog twice daily via sliding scale with max daily dose of 15 units Levemir 75 units daily Trulicity 1.37m9.3ATe weekly - Mondays Lyrica 100mg29mce daily -Medications previously tried: glimepiride  -Current home glucose readings fasting glucose: 106, 113, 66,59 post prandial glucose: 202, 157 -Reports hypoglycemic/hyperglycemic symptoms some mornings when she is having the 50-60s blood sugars where she gets shaky and cannot get out of bed.  She takes glucose tablets and this usually resolves. -Current exercise: minimal -Educated on A1c and blood sugar goals; Exercise goal of 150 minutes per week; Prevention and management of hypoglycemic episodes; Benefits of routine self-monitoring of blood sugar; -Counseled to check feet daily and get yearly eye exams -Counseled on diet and exercise extensively Recommended to continue current medication Recommended recheck A1c at upcoming physical and to bring in glucose log.  If she continues to have low blood sugars, I would consider decreasing her basal insulin dose to 60 units hs and titrate down based on glucose readings.   Also assessed patient  finances and she reports high copay on Trulicity.  Based on reported income she would qualify for patient assistance.  Will have application mailed to  her to bring in once she has signed required portion.  Update 01/19/21 Now taking 60 units of Levemir in evening 80-100 units in the morning, 3-4 per week in the 70s 97, 137, 82, 71, 79 am readings 199, 275, 179, 220 throughout the day It seems she is having overnight lows, and then the rest of her readings throughout the day are higher.  We discussed possibly taking her Levemir in the morning to try and cover more of her daytime meals.  She is open to this, however for now we will leave as is and monitor for 30 days.  She is to call me if she has multiple lows in the 70s before then and we will try and switch to daytime Levemir dosing.  Trulicity was approved for PAP so she is getting that for free through the end of the year. Continue current meds for now and FU in 30 days.  GERD (Goal: Minimize symptoms) -Controlled -Current treatment  Pantoprazole 56m daily -Medications previously tried: none noted -Denies symptoms, continues to work on triggers  -Recommended to continue current medication  Patient Goals/Self-Care Activities Patient will:  - take medications as prescribed check glucose daily, document, and provide at future appointments check blood pressure a few times per week, document, and provide at future appointments collaborate with provider on medication access solutions  Follow Up Plan: The care management team will reach out to the patient again over the next 90 days.

## 2021-01-19 ENCOUNTER — Ambulatory Visit: Payer: Medicare HMO | Admitting: Pharmacist

## 2021-01-19 DIAGNOSIS — K219 Gastro-esophageal reflux disease without esophagitis: Secondary | ICD-10-CM | POA: Diagnosis not present

## 2021-01-19 DIAGNOSIS — E1165 Type 2 diabetes mellitus with hyperglycemia: Secondary | ICD-10-CM

## 2021-01-19 DIAGNOSIS — I10 Essential (primary) hypertension: Secondary | ICD-10-CM | POA: Diagnosis not present

## 2021-01-19 DIAGNOSIS — Z794 Long term (current) use of insulin: Secondary | ICD-10-CM

## 2021-01-19 DIAGNOSIS — E782 Mixed hyperlipidemia: Secondary | ICD-10-CM

## 2021-01-19 NOTE — Patient Instructions (Addendum)
Visit Information   Goals Addressed   None    Patient Care Plan: General Pharmacy (Adult)     Problem Identified: HTN, GERD, Type II DM w/ CKD, HLD, Breast Cancer   Priority: High  Onset Date: 10/03/2020     Long-Range Goal: Patient-Specific Goal   Start Date: 10/03/2020  Expected End Date: 04/04/2021  Recent Progress: On track  Priority: High  Note:   Current Barriers:  Unable to achieve control of fasting glucose   Pharmacist Clinical Goal(s):  Patient will achieve adherence to monitoring guidelines and medication adherence to achieve therapeutic efficacy achieve control of fasting glucose as evidenced by monitoring contact provider office for questions/concerns as evidenced notation of same in electronic health record through collaboration with PharmD and provider.   Interventions: 1:1 collaboration with Lavera Guise, MD regarding development and update of comprehensive plan of care as evidenced by provider attestation and co-signature Inter-disciplinary care team collaboration (see longitudinal plan of care) Comprehensive medication review performed; medication list updated in electronic medical record  Hypertension (BP goal <130/80) -Controlled -Current treatment: Verapamil 40mg  daily Lisinopril 10mg  daily -Medications previously tried: hydralazine 10mg  TID  -Current home readings: 130/80 - at [facility] no logs present -Current dietary habits: tries to watch what she eats, eats chicken/fish, stir fry -Reports hypotensive/hypertensive symptoms including dizziness and fatigue -Educated on BP goals and benefits of medications for prevention of heart attack, stroke and kidney damage; Daily salt intake goal < 2300 mg; Exercise goal of 150 minutes per week; Importance of home blood pressure monitoring; Symptoms of hypotension and importance of maintaining adequate hydration; -Counseled to monitor BP at home a few times per week, document, and provide log at future  appointments -Recommended to continue current medication  Hyperlipidemia: (LDL goal < 70) -Not ideally controlled -Current treatment: Rosuvastatin 20mg  daily -Medications previously tried: none noted  -Current dietary patterns: see above -Educated on Cholesterol goals;  Benefits of statin for ASCVD risk reduction; Importance of limiting foods high in cholesterol; Exercise goal of 150 minutes per week; -Recommended to continue current medication Recommended repeat lipid panel at next visit which is a physical with Alyssa NP  Update 01/19/21 Continues to take med with no adverse effects Most recent lipid panel in June was excellent.  Continue meds with no changes  Diabetes w/neuropathy (A1c goal <7%) -Controlled -Current medications: Novolog twice daily via sliding scale with max daily dose of 15 units Levemir 75 units daily Trulicity 1.5mg  once weekly - Mondays Lyrica 100mg  twice daily -Medications previously tried: glimepiride  -Current home glucose readings fasting glucose: 106, 113, 66,59 post prandial glucose: 202, 157 -Reports hypoglycemic/hyperglycemic symptoms some mornings when she is having the 50-60s blood sugars where she gets shaky and cannot get out of bed.  She takes glucose tablets and this usually resolves. -Current exercise: minimal -Educated on A1c and blood sugar goals; Exercise goal of 150 minutes per week; Prevention and management of hypoglycemic episodes; Benefits of routine self-monitoring of blood sugar; -Counseled to check feet daily and get yearly eye exams -Counseled on diet and exercise extensively Recommended to continue current medication Recommended recheck A1c at upcoming physical and to bring in glucose log.  If she continues to have low blood sugars, I would consider decreasing her basal insulin dose to 60 units hs and titrate down based on glucose readings.   Also assessed patient finances and she reports high copay on Trulicity.  Based on  reported income she would qualify for patient assistance.  Will have application  mailed to her to bring in once she has signed required portion.  Update 01/19/21 Now taking 60 units of Levemir in evening 80-100 units in the morning, 3-4 per week in the 70s 97, 137, 82, 71, 79 am readings 199, 275, 179, 220 throughout the day It seems she is having overnight lows, and then the rest of her readings throughout the day are higher.  We discussed possibly taking her Levemir in the morning to try and cover more of her daytime meals.  She is open to this, however for now we will leave as is and monitor for 30 days.  She is to call me if she has multiple lows in the 70s before then and we will try and switch to daytime Levemir dosing.  Trulicity was approved for PAP so she is getting that for free through the end of the year. Continue current meds for now and FU in 30 days.  GERD (Goal: Minimize symptoms) -Controlled -Current treatment  Pantoprazole 40mg  daily -Medications previously tried: none noted -Denies symptoms, continues to work on triggers  -Recommended to continue current medication  Patient Goals/Self-Care Activities Patient will:  - take medications as prescribed check glucose daily, document, and provide at future appointments check blood pressure a few times per week, document, and provide at future appointments collaborate with provider on medication access solutions  Follow Up Plan: The care management team will reach out to the patient again over the next 90 days.          Patient verbalizes understanding of instructions provided today and agrees to view in [place].  Telephone follow up appointment with pharmacy team member scheduled for:  Edythe Clarity, [place]

## 2021-01-29 ENCOUNTER — Ambulatory Visit
Admission: RE | Admit: 2021-01-29 | Discharge: 2021-01-29 | Disposition: A | Discharge status: Home / Self Care | Payer: Medicare HMO | Source: Ambulatory Visit | Attending: Oncology | Admitting: Oncology

## 2021-01-29 ENCOUNTER — Other Ambulatory Visit: Payer: Self-pay

## 2021-01-29 DIAGNOSIS — D0511 Intraductal carcinoma in situ of right breast: Secondary | ICD-10-CM | POA: Insufficient documentation

## 2021-01-29 DIAGNOSIS — R922 Inconclusive mammogram: Secondary | ICD-10-CM | POA: Diagnosis not present

## 2021-02-05 NOTE — Progress Notes (Signed)
This encounter was created in error - please disregard.

## 2021-02-06 ENCOUNTER — Inpatient Hospital Stay: Payer: Medicare HMO | Admitting: Oncology

## 2021-02-06 ENCOUNTER — Other Ambulatory Visit: Payer: Self-pay

## 2021-02-06 DIAGNOSIS — D0511 Intraductal carcinoma in situ of right breast: Secondary | ICD-10-CM

## 2021-02-06 DIAGNOSIS — Z853 Personal history of malignant neoplasm of breast: Secondary | ICD-10-CM | POA: Diagnosis not present

## 2021-02-06 NOTE — Progress Notes (Signed)
Pt reports fall in July and c/o intermittent back pain since that time. Pt also reports decreased appetite x 2-3 weeks. No other concerns at this time.

## 2021-02-07 ENCOUNTER — Inpatient Hospital Stay: Payer: Medicare HMO | Attending: Oncology | Admitting: Oncology

## 2021-02-07 DIAGNOSIS — W19XXXA Unspecified fall, initial encounter: Secondary | ICD-10-CM

## 2021-02-07 DIAGNOSIS — D0511 Intraductal carcinoma in situ of right breast: Secondary | ICD-10-CM | POA: Diagnosis not present

## 2021-02-07 MED ORDER — TAMOXIFEN CITRATE 20 MG PO TABS: 20.0000 mg | ORAL_TABLET | Freq: Every day | ORAL | 3 refills | Status: DC

## 2021-02-07 NOTE — Progress Notes (Signed)
[facility]  [phone] Fax:([phone]) [phone]  ID: Kara [name] OB: [birth year]  MR#: [id number]  TGG#:[id number]  Patient Care Team: Lavera Guise, MD as PCP - General (Internal Medicine) Lloyd Huger, MD as Consulting Physician (Oncology) Herbert Pun, MD as Consulting Physician (General Surgery) Noreene Filbert, MD as Referring Physician (Radiation Oncology) Edythe Clarity, [facility] as Pharmacist (Pharmacist)  I connected with Kara [name] on 02/07/21 at 10:30 AM EDT by telephone visit and verified that I am speaking with the correct person using two identifiers.   I discussed the limitations, risks, security and privacy concerns of performing an evaluation and management service by telemedicine and the availability of in-person appointments. I also discussed with the patient that there may be a patient responsible charge related to this service. The patient expressed understanding and agreed to proceed.   Other persons participating in the visit and their role in the encounter: Patient, MD.  Patient's location: Home. Provider's location: Clinic.  CHIEF COMPLAINT: [name]-grade DCIS of the right breast.  HPI: Kara [name] is a 75 year old female with multiple medical problems including [name]-grade DCIS of the right breast who is followed by Kara [name].  She is here for routine 24-monthfollow-up and to review mammogram.  INTERVAL HISTORY: On Tamoxifen. Has had several falls recently. Most recent fall was in July she landed on her knees. Denies injury only mild bruising. Using cane for stabilization. Has neuropathy in her feet and is unable to feel her toes which she feels is contributing. Continues to have a rash on legs, lower back, arms, scalp and ears and is followed by dermatology. On several medications including doxycyclines. Some intentional weight loss. Cut back on calories.  Reports approximately 7 to 10 pounds of weight loss since  Christmas.  REVIEW OF SYSTEMS:   Review of Systems  Constitutional: Negative.  Negative for fever, malaise/fatigue and weight loss.  Respiratory: Negative.  Negative for cough, hemoptysis and shortness of breath.   Cardiovascular: Negative.  Negative for chest pain and leg swelling.  Gastrointestinal: Negative.  Negative for abdominal pain.  Genitourinary: Negative.  Negative for dysuria.  Musculoskeletal: Negative.  Negative for back pain and joint pain.  Skin:  Positive for itching and rash.  Neurological: Negative.  Negative for dizziness, focal weakness, weakness and headaches.  Psychiatric/Behavioral: Negative.  The patient is not nervous/anxious.    As per HPI. Otherwise, a complete review of systems is negative.  PAST MEDICAL HISTORY: Past Medical History:  Diagnosis Date   Anemia    Arthropathy    Benign neoplasm of breast    [place]    Chest pain    Chronic kidney disease    30% kidney function   [place]    [place]    Dyspnea    Edema    History of kidney stones    Hyperlipemia    Hypersomnia with sleep apnea    Hypertension    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HCC)    Lumbago    Osteoarthrosis    Ovarian failure    Personal history of radiation therapy    SOB (shortness of breath)     PAST SURGICAL HISTORY: Past Surgical History:  Procedure Laterality Date   BILATERAL SALPINGECTOMY     BREAST BIOPSY Right 2014   neg- core   BREAST BIOPSY Right 12/21/2018   [facility], venus clip,  DUCTAL CARCINOMA IN SITU   BREAST BIOPSY Right 12/21/2018   [facility],  ribbon clip,  FIBROEPITHELIAL PROLIFERATION WITH SCLEROSIS   BREAST BIOPSY Right 01/01/2019   Affirm bx "X" clip-path pending   BREAST DUCTAL SYSTEM EXCISION     BREAST EXCISIONAL BIOPSY Right 2014   neg   BREAST LUMPECTOMY Right 01/22/2019   path pending   CATARACT EXTRACTION     CHOLECYSTECTOMY     CYSTOSCOPY WITH STENT PLACEMENT Left 04/14/2015   Procedure:  CYSTOSCOPY WITH STENT PLACEMENT;  Surgeon: Hollice Espy, MD;  Location: [facility];  Service: Urology;  Laterality: Left;   EXTRACORPOREAL SHOCK WAVE LITHOTRIPSY Left 09/29/2014   Procedure: EXTRACORPOREAL SHOCK WAVE LITHOTRIPSY (ESWL);  Surgeon: Hollice Espy, MD;  Location: [facility];  Service: Urology;  Laterality: Left;   EYE SURGERY     bilateral cataract   IRRIGATION AND DEBRIDEMENT ABSCESS Right 03/24/2019   Procedure: IRRIGATION AND DEBRIDEMENT ABSCESS RIGHT BREAST;  Surgeon: Herbert Pun, MD;  Location: [facility];  Service: General;  Laterality: Right;   LAPAROSCOPIC OOPHERECTOMY Left    PARTIAL MASTECTOMY WITH NEEDLE LOCALIZATION Right 01/22/2019   Procedure: PARTIAL MASTECTOMY WITH NEEDLE LOCALIZATION;  Surgeon: Herbert Pun, MD;  Location: [facility];  Service: General;  Laterality: Right;   TONSILLECTOMY     TUBAL LIGATION     URETEROSCOPY WITH HOLMIUM LASER LITHOTRIPSY Left 04/14/2015   Procedure: URETEROSCOPY WITH HOLMIUM LASER LITHOTRIPSY;  Surgeon: Hollice Espy, MD;  Location: [facility];  Service: Urology;  Laterality: Left;    FAMILY HISTORY: Family History  Problem Relation Age of Onset   Breast cancer Maternal Grandmother 40   Colon cancer Mother    Lung cancer Father     ADVANCED DIRECTIVES (Y/N):  N  HEALTH MAINTENANCE: Social History   Tobacco Use   Smoking status: Former    Packs/day: 1.00    Years: 30.00    Pack years: 30.00    Types: Cigarettes    Quit date: 04/11/1995    Years since quitting: 25.8   Smokeless tobacco: Never  Vaping Use   Vaping Use: Never used  Substance Use Topics   Alcohol use: No    Alcohol/week: 0.0 standard drinks   Drug use: No     Colonoscopy:  PAP:  Bone density:  Lipid panel:  No Known Allergies  Current Outpatient Medications  Medication Sig Dispense Refill   Aflibercept (EYLEA) 2 MG/0.05ML SOLN      APPLE CIDER VINEGAR PO Take 2 tablets by mouth daily.  (Patient not taking: Reported on  02/06/2021)     aspirin EC 81 MG tablet Take 81 mg by mouth daily.      cholecalciferol (VITAMIN D) 1000 UNITS tablet Take 2,000 Units by mouth daily.      clobetasol cream (TEMOVATE) 0.05 % Apply to more severely affected areas of rash twice a day until improved. Avoid face, groin, underarms. 120 g 3   clobetasol ointment (TEMOVATE) 0.05 % Apply to affected areas twice daily. Avoid applying to face, groin, and axilla. Use as directed. Risk of skin atrophy with long-term use reviewed. 60 g 1   doxycycline (MONODOX) 100 MG capsule Take 1 capsule (100 mg total) by mouth 2 (two) times daily. Take with food. 60 capsule 5   Dulaglutide (TRULICITY) 1.5 WG/6.6ZL SOPN Inject 1.5 mg into the skin once a week. 6 mL 4   Ferrous Gluconate 324 (37.5 Fe) MG TABS Take 324 mg by mouth daily.     furosemide (LASIX) 20 MG tablet Take 20 mg by mouth 2 (two) times daily.  hydrOXYzine (ATARAX/VISTARIL) 10 MG tablet TAKE 1-3 TABLETS BY MOUTH EVERY NIGHT AS NEEDED FOR ITCH. 90 tablet 1   insulin detemir (LEVEMIR FLEXTOUCH) 100 UNIT/ML FlexPen INJECT UP TO 75 UNITS UNDER THE SKIN DAILY 24 mL 2   Lactobacillus (PROBIOTIC ACIDOPHILUS) CAPS Take 1 capsule by mouth daily.     lisinopril (ZESTRIL) 10 MG tablet Take 10 mg by mouth daily.     methocarbamol (ROBAXIN) 500 MG tablet Take 1 tablet (500 mg total) by mouth every 8 (eight) hours as needed for muscle spasms. 60 tablet 0   Multiple Vitamin (MULTIVITAMIN) tablet Take 1 tablet by mouth daily at 12 noon.     mupirocin ointment (BACTROBAN) 2 % Apply to open wounds and cover 1-2 times daily until healed. 22 g 0   NOVOLOG FLEXPEN 100 UNIT/ML FlexPen INJECT [place] TWICE DAILY AS DIRECTED PER SLIDING SCALE. MAX DAILY DOSE IS 30 UNITS. DX E11.65. 15 mL 3   pantoprazole (PROTONIX) 40 MG tablet TAKE 1 TABLET(S) BY MOUTH DAILY 90 tablet 1   pregabalin (LYRICA) 100 MG capsule TAKE 1 CAPSULE BY MOUTH TWICE A DAY 180 capsule 1   rosuvastatin (CRESTOR) 20 MG tablet TAKE 1 TABLET BY  MOUTH EVERYDAY AT BEDTIME 90 tablet 3   tamoxifen (NOLVADEX) 20 MG tablet TAKE 1 TABLET BY MOUTH EVERY DAY 90 tablet 3   traMADol-acetaminophen (ULTRACET) 37.5-325 MG tablet TAKE 1 TABLET BY MOUTH 2 (TWO) TIMES DAILY AS NEEDED FOR MODERATE PAIN. 60 tablet 1   triamcinolone (KENALOG) 0.025 % cream Apply 1 application topically 2 (two) times daily. 80 g 2   triamcinolone (KENALOG) 0.1 % Apply 1 application topically 2 (two) times daily as needed (Rash). Avoid face, groin, underarms. 453.6 g 2   verapamil (CALAN) 40 MG tablet Take 1 tablet (40 mg total) by mouth 2 (two) times daily. 180 tablet 1   No current facility-administered medications for this visit.    OBJECTIVE: There were no vitals filed for this visit.   There is no height or weight on file to calculate BMI.    ECOG FS:0 - Asymptomatic  Physical Exam Neurological:     Mental Status: She is alert and oriented to person, place, and time.    LAB RESULTS:  Lab Results  Component Value Date   NA 139 10/16/2020   K 4.4 10/16/2020   CL 101 10/16/2020   CO2 22 10/16/2020   GLUCOSE 101 (H) 10/16/2020   BUN 35 (H) 10/16/2020   CREATININE 1.94 (H) 10/16/2020   CALCIUM 9.2 10/16/2020   PROT 6.6 10/16/2020   ALBUMIN 3.9 10/16/2020   AST 23 10/16/2020   ALT 19 10/16/2020   ALKPHOS 61 10/16/2020   BILITOT 0.3 10/16/2020   GFRNONAA 34 (L) 08/16/2019   GFRAA 39 (L) 08/16/2019    Lab Results  Component Value Date   WBC 6.2 10/16/2020   NEUTROABS 3.1 10/16/2020   HGB 11.1 10/16/2020   HCT 35.0 10/16/2020   MCV 86 10/16/2020   PLT 146 (L) 10/16/2020     STUDIES: MM DIAG BREAST TOMO BILATERAL  Result Date: 01/29/2021 CLINICAL DATA:  History of a right lumpectomy for DCIS, performed in October 2020. Routine annual mammographic surveillance. Patient has no current breast complaints. EXAM: DIGITAL DIAGNOSTIC BILATERAL MAMMOGRAM WITH TOMOSYNTHESIS AND CAD TECHNIQUE: Bilateral digital diagnostic mammography and breast  tomosynthesis was performed. The images were evaluated with computer-aided detection. COMPARISON:  Previous exam(s). ACR Breast Density Category b: There are scattered areas of fibroglandular density. FINDINGS: Post  lumpectomy changes in the upper outer right breast are stable. There are no suspicious masses, areas of significant asymmetry, areas of nonsurgical architectural distortion or suspicious calcifications. IMPRESSION: 1. No evidence of new or recurrent breast carcinoma. 2. Benign post lumpectomy changes on the right. RECOMMENDATION: Screening mammogram in one year.(Code:SM-B-01Y) I have discussed the findings and recommendations with the patient. If applicable, a reminder letter will be sent to the patient regarding the next appointment. BI-RADS CATEGORY  2: Benign. Electronically Signed   By: Lajean Manes M.D.   On: 01/29/2021 11:20   ASSESSMENT: [name]-grade DCIS of the right breast.  PLAN:    1. [name]-grade DCIS of the right breast- Underwent lumpectomy on 01/22/2019.  Final pathology noted close but clear margins.  Initiated XRT for approximately 3 weeks.  She was started on tamoxifen and will complete 5 years of treatment in December 2025.  Most recent mammogram is from 01/29/2021 which is BI-RADS Category 2 benign.  Repeat in 1 year.  RTC in 6 months for virtual follow-up with Kara [name].  2. Fall-secondary to peripheral neuropathy.  Using a cane.  Patient would benefit from the care program.  Referral sent.  3.  Bullous pemphigoid-followed by dermatology.  Notes improvement of her symptoms.  Using topical clobetasol and Bactroban 2%.  She is on doxycycline 100 mg twice daily.  Disposition-RTC in 6 months for follow-up with Kara [name] via virtual visit.  Referral to [facility] care program.  I spent 20 minutes dedicated to the care of this patient (face-to-face and non-face-to-face) on the date of the encounter to include what is described in the assessment and plan.  Patient expressed  understanding and was in agreement with this plan. She also understands that She can call clinic at any time with any questions, concerns, or complaints.   Cancer Staging Ductal carcinoma in situ (DCIS) of right breast Staging form: Breast, AJCC 8th Edition - Clinical stage from 01/01/2019: Stage 0 (cTis (DCIS), cN0, cM0, G1, ER+, PR: Not Assessed, HER2: Not Assessed) - Signed by Lloyd Huger, MD on 01/01/2019 Stage prefix: Initial diagnosis Histologic grading system: 3 grade system   Jacquelin Hawking, NP   02/07/2021 10:37 AM

## 2021-02-08 DIAGNOSIS — R600 Localized edema: Secondary | ICD-10-CM | POA: Diagnosis not present

## 2021-02-08 DIAGNOSIS — I10 Essential (primary) hypertension: Secondary | ICD-10-CM | POA: Diagnosis not present

## 2021-02-08 DIAGNOSIS — N2581 Secondary hyperparathyroidism of renal origin: Secondary | ICD-10-CM | POA: Diagnosis not present

## 2021-02-08 DIAGNOSIS — E1122 Type 2 diabetes mellitus with diabetic chronic kidney disease: Secondary | ICD-10-CM | POA: Diagnosis not present

## 2021-02-08 DIAGNOSIS — D631 Anemia in chronic kidney disease: Secondary | ICD-10-CM | POA: Diagnosis not present

## 2021-02-08 DIAGNOSIS — N184 Chronic kidney disease, stage 4 (severe): Secondary | ICD-10-CM | POA: Diagnosis not present

## 2021-02-17 ENCOUNTER — Other Ambulatory Visit: Payer: Self-pay | Admitting: Internal Medicine

## 2021-02-27 DIAGNOSIS — H34832 Tributary (branch) retinal vein occlusion, left eye, with macular edema: Secondary | ICD-10-CM | POA: Diagnosis not present

## 2021-02-28 ENCOUNTER — Telehealth: Payer: Self-pay | Admitting: Student-PharmD

## 2021-02-28 NOTE — Progress Notes (Addendum)
Chronic Care Management Pharmacy Assistant   Name: Kara [name]  MRN: [id number] DOB: [birth year]  Reason for Encounter: Disease State For HTN And DM.   Conditions to be addressed/monitored: HTN, GERD, Type II DM w/ CKD, HLD, Breast Cancer  Recent office visits:  None since last pharm D call on 01/19/21  Recent consult visits:  02/08/21 Nephrology Murlean Iba, MD For follow-up. No medication changes.  02/07/21 Oncology Quay Burow, Wandra Feinstein, NP. For fall. No medication changes. 02/06/21 Oncology Quay Burow, Wandra Feinstein, NP. For Ductal carcinoma in situ (DCIS) of right breast. No medication changes.  02/06/21 [facility] Lorine Bears, MD For follow-up.   Hospital visits:  None since last pharm D call on 01/19/21  Medications: Outpatient Encounter Medications as of 02/28/2021  Medication Sig   Aflibercept (EYLEA) 2 MG/0.05ML SOLN    APPLE CIDER VINEGAR PO Take 2 tablets by mouth daily.  (Patient not taking: Reported on 02/06/2021)   aspirin EC 81 MG tablet Take 81 mg by mouth daily.    cholecalciferol (VITAMIN D) 1000 UNITS tablet Take 2,000 Units by mouth daily.    clobetasol cream (TEMOVATE) 0.05 % Apply to more severely affected areas of rash twice a day until improved. Avoid face, groin, underarms.   clobetasol ointment (TEMOVATE) 0.05 % Apply to affected areas twice daily. Avoid applying to face, groin, and axilla. Use as directed. Risk of skin atrophy with long-term use reviewed.   doxycycline (MONODOX) 100 MG capsule Take 1 capsule (100 mg total) by mouth 2 (two) times daily. Take with food.   Dulaglutide (TRULICITY) 1.5 NA/3.5TD SOPN Inject 1.5 mg into the skin once a week.   Ferrous Gluconate 324 (37.5 Fe) MG TABS Take 324 mg by mouth daily.   furosemide (LASIX) 20 MG tablet Take 20 mg by mouth 2 (two) times daily.   hydrOXYzine (ATARAX/VISTARIL) 10 MG tablet TAKE 1-3 TABLETS BY MOUTH EVERY NIGHT AS NEEDED FOR ITCH.   insulin detemir (LEVEMIR  FLEXTOUCH) 100 UNIT/ML FlexPen INJECT UP TO 75 UNITS UNDER THE SKIN DAILY   Lactobacillus (PROBIOTIC ACIDOPHILUS) CAPS Take 1 capsule by mouth daily.   lisinopril (ZESTRIL) 10 MG tablet Take 10 mg by mouth daily.   methocarbamol (ROBAXIN) 500 MG tablet Take 1 tablet (500 mg total) by mouth every 8 (eight) hours as needed for muscle spasms.   Multiple Vitamin (MULTIVITAMIN) tablet Take 1 tablet by mouth daily at 12 noon.   mupirocin ointment (BACTROBAN) 2 % Apply to open wounds and cover 1-2 times daily until healed.   NOVOLOG FLEXPEN 100 UNIT/ML FlexPen INJECT [place] TWICE DAILY AS DIRECTED PER SLIDING SCALE. MAX DAILY DOSE IS 30 UNITS. DX E11.65.   pantoprazole (PROTONIX) 40 MG tablet TAKE 1 TABLET(S) BY MOUTH DAILY   pregabalin (LYRICA) 100 MG capsule TAKE 1 CAPSULE BY MOUTH TWICE A DAY   rosuvastatin (CRESTOR) 20 MG tablet TAKE 1 TABLET BY MOUTH EVERYDAY AT BEDTIME   tamoxifen (NOLVADEX) 20 MG tablet Take 1 tablet (20 mg total) by mouth daily.   traMADol-acetaminophen (ULTRACET) 37.5-325 MG tablet TAKE 1 TABLET BY MOUTH 2 (TWO) TIMES DAILY AS NEEDED FOR MODERATE PAIN.   triamcinolone (KENALOG) 0.025 % cream Apply 1 application topically 2 (two) times daily.   triamcinolone (KENALOG) 0.1 % Apply 1 application topically 2 (two) times daily as needed (Rash). Avoid face, groin, underarms.   verapamil (CALAN) 40 MG tablet Take 1 tablet (40 mg total) by mouth 2 (two) times daily.   No facility-administered encounter medications  on file as of 02/28/2021.   Reviewed chart prior to disease state call. Spoke with patient regarding BP  Recent Office Vitals: BP Readings from Last 3 Encounters:  01/12/21 130/70  10/09/20 (!) 154/72  07/03/20 128/82   Pulse Readings from Last 3 Encounters:  01/12/21 60  10/09/20 62  07/03/20 62    Wt Readings from Last 3 Encounters:  01/12/21 208 lb 12.8 oz (94.7 kg)  10/09/20 211 lb 6.4 oz (95.9 kg)  07/03/20 207 lb 12.8 oz (94.3 kg)     Kidney Function Lab  Results  Component Value Date/Time   CREATININE 1.94 (H) 10/16/2020 08:31 AM   CREATININE 1.51 (H) 08/16/2019 10:35 AM   CREATININE 1.47 (H) 03/08/2013 11:37 AM   CREATININE 0.89 07/23/2011 11:03 AM   GFRNONAA 34 (L) 08/16/2019 10:35 AM   GFRNONAA 37 (L) 03/08/2013 11:37 AM   GFRAA 39 (L) 08/16/2019 10:35 AM   GFRAA 43 (L) 03/08/2013 11:37 AM    BMP Latest Ref Rng & Units 10/16/2020 08/16/2019 03/25/2019  Glucose 65 - 99 mg/dL 101(H) 180(H) -  BUN 8 - 27 mg/dL 35(H) 25 -  Creatinine 0.57 - 1.00 mg/dL 1.94(H) 1.51(H) 2.12(H)  BUN/Creat Ratio 12 - 28 18 17  -  Sodium 134 - 144 mmol/L 139 138 -  Potassium 3.5 - 5.2 mmol/L 4.4 4.2 -  Chloride 96 - 106 mmol/L 101 97 -  CO2 20 - 29 mmol/L 22 27 -  Calcium 8.7 - 10.3 mg/dL 9.2 9.3 -    Current antihypertensive regimen:  Verapamil 40mg  daily Lisinopril 10mg  daily  How often are you checking your Blood Pressure? infrequently  Current home BP readings: Patient was not able to give me any blood pressure readings because she doesn't check her blood pressure regularly.   What recent interventions/DTPs have been made by any provider to improve Blood Pressure control since last CPP Visit: None.  Any recent hospitalizations or ED visits since last visit with CPP? Patient stated no.  What diet changes have been made to improve Blood Pressure Control?  Patient stated her diet is about the same. She stated she has good appetite and drinks plenty of water.   What exercise is being done to improve your Blood Pressure Control?  Patient stated she is pretty active gets up and does things daily.   Adherence Review: Is the patient currently on ACE/ARB medication? Lisinopril 10 mg   Does the patient have >5 day gap between last estimated fill dates? Per misc rpts, yes.  Recent Relevant Labs: Lab Results  Component Value Date/Time   HGBA1C 6.9 (A) 01/12/2021 10:45 AM   HGBA1C 6.8 (A) 10/09/2020 10:57 AM   HGBA1C 7.6 (H) 03/21/2019 08:12 PM     Kidney Function Lab Results  Component Value Date/Time   CREATININE 1.94 (H) 10/16/2020 08:31 AM   CREATININE 1.51 (H) 08/16/2019 10:35 AM   CREATININE 1.47 (H) 03/08/2013 11:37 AM   CREATININE 0.89 07/23/2011 11:03 AM   GFRNONAA 34 (L) 08/16/2019 10:35 AM   GFRNONAA 37 (L) 03/08/2013 11:37 AM   GFRAA 39 (L) 08/16/2019 10:35 AM   GFRAA 43 (L) 03/08/2013 11:37 AM    Current antihyperglycemic regimen:  Novolog twice daily via sliding scale with max daily dose of 15 units Levemir 60 units daily Trulicity 1.5mg  once weekly - Mondays Lyrica 100mg  twice daily  What recent interventions/DTPs have been made to improve glycemic control:  Levemir 60 units daily(Patients choice)  Have there been any recent hospitalizations or ED  visits since last visit with CPP? Patient stated no.  Patient denies hypoglycemic symptoms, including None  Patient denies hyperglycemic symptoms, including none  How often are you checking your blood sugar? Patient stated once daily and 3-4 times daily  What are your blood sugars ranging?  Patient stated her blood sugars are within normal range  when she checks it three times a day.   During the week, how often does your blood glucose drop below 70? Patient stated Never  Are you checking your feet daily/regularly?  Patient stated she checks her feet regularly. She stated they have been swelling lately. We dicussed ways to decrease this and If it continues to occur then to call the office for appointment.   Adherence Review: Is the patient currently on a STATIN medication? Rosuvastatin 20 mg   Is the patient currently on ACE/ARB medication?  Lisinopril 10 mg   Does the patient have >5 day gap between last estimated fill dates? Per misc rpts, yes.  Care Gaps:Not on the list.  Star Rating Drugs:Rosuvastatin 20 mg 10/29/20 90 DS, Pregablain 100 mg 3/64/38 60 DS, Trulicity 1.5 PJ/7.9 ML 10/16/20 28 DS, Lisinopril 10 mg 09/14/20 90 DS. (CHANGED [facility], reason why  its not showing correct refills, per patient)  Follow-Up:Pharmacist Review  Charlann Lange, RMA Clinical Pharmacist Assistant [phone]  5 minutes spent in review, coordination, and documentation.  Reviewed by: Alena Bills, PharmD Clinical Pharmacist [phone]

## 2021-03-05 NOTE — Progress Notes (Signed)
Chronic Care Management Pharmacy Note  03/07/2021 Name:  Kara [name] MRN:  [id number] DOB:  12/16/1945  Summary: -Patient reports controlled BP readings at home (taken once every couple weeks): 135/70s -Patient reports blood sugars within goal for fasting and prandial.  Recommendations/Changes made from today's visit: -Continue current medication regiment -Start tracking BP once weekly  Plan: F/U in 4 months via phone   Subjective: Kara [name] is an 75 y.o. year old female who is a primary patient of Kara [name], Kara Gaul, MD.  The CCM team was consulted for assistance with disease management and care coordination needs.    Engaged with patient by telephone for follow up visit in response to provider referral for pharmacy case management and/or care coordination services.   Consent to Services:  The patient was given the following information about Chronic Care Management services today, agreed to services, and gave verbal consent: 1. CCM service includes personalized support from designated clinical staff supervised by the primary care provider, including individualized plan of care and coordination with other care providers 2. 24/7 contact phone numbers for assistance for urgent and routine care needs. 3. Service will only be billed when office clinical staff spend 20 minutes or more in a month to coordinate care. 4. Only one practitioner may furnish and bill the service in a calendar month. 5.The patient may stop CCM services at any time (effective at the end of the month) by phone call to the office staff. 6. The patient will be responsible for cost sharing (co-pay) of up to 20% of the service fee (after annual deductible is met). Patient agreed to services and consent obtained.  Patient Care Team: Kara Guise, MD as PCP - General (Internal Medicine) Kara Huger, MD as Consulting Physician (Oncology) Kara Pun, MD as Consulting Physician (General  Surgery) Kara Filbert, MD as Referring Physician (Radiation Oncology) Kara [name], [facility] as Pharmacist (Pharmacist)  Recent office visits:  None since last pharm D call on 01/19/21   Recent consult visits: 02/08/21 Nephrology Murlean Iba, MD For follow-up. No medication changes.  02/07/21 Oncology Quay Burow, Wandra Feinstein, NP. For fall. No medication changes. 02/06/21 Oncology Quay Burow, Wandra Feinstein, NP. For Ductal carcinoma in situ (DCIS) of right breast. No medication changes.  02/06/21 [facility] [place] Lorine Bears, MD For follow-up.    Hospital visits:  None in previous 4 months   Medication History: Rosuvastatin 20 mg 90 DS 07/27/20 Lisinopril 10 mg 30 DS 07/13/20    Objective:  Lab Results  Component Value Date   CREATININE 1.94 (H) 10/16/2020   BUN 35 (H) 10/16/2020   GFRNONAA 34 (L) 08/16/2019   GFRAA 39 (L) 08/16/2019   NA 139 10/16/2020   K 4.4 10/16/2020   CALCIUM 9.2 10/16/2020   CO2 22 10/16/2020   GLUCOSE 101 (H) 10/16/2020    Lab Results  Component Value Date/Time   HGBA1C 6.9 (A) 01/12/2021 10:45 AM   HGBA1C 6.8 (A) 10/09/2020 10:57 AM   HGBA1C 7.6 (H) 03/21/2019 08:12 PM    Last diabetic Eye exam: No results found for: HMDIABEYEEXA  Last diabetic Foot exam: No results found for: HMDIABFOOTEX   Lab Results  Component Value Date   CHOL 144 10/16/2020   HDL 44 10/16/2020   LDLCALC 62 10/16/2020   TRIG 234 (H) 10/16/2020   CHOLHDL 3.3 10/16/2020    Hepatic Function Latest Ref Rng & Units 10/16/2020 08/16/2019 03/21/2019  Total Protein 6.0 - 8.5 g/dL 6.6 7.3 7.7  Albumin 3.7 - 4.7 g/dL 3.9 4.0 3.8  AST 0 - 40 IU/L 23 27 24   ALT 0 - 32 IU/L 19 25 21   Alk Phosphatase 44 - 121 IU/L 61 90 81  Total Bilirubin 0.0 - 1.2 mg/dL 0.3 0.3 1.3(H)    Lab Results  Component Value Date/Time   TSH 1.780 10/16/2020 08:31 AM   TSH 1.750 08/16/2019 10:35 AM   FREET4 1.25 10/16/2020 08:31 AM   FREET4 1.35 08/16/2019 10:35 AM    CBC  Latest Ref Rng & Units 10/16/2020 08/16/2019 03/24/2019  WBC 3.4 - 10.8 x10E3/uL 6.2 5.5 11.2(H)  Hemoglobin 11.1 - 15.9 g/dL 11.1 11.9 9.1(L)  Hematocrit 34.0 - 46.6 % 35.0 36.3 27.7(L)  Platelets 150 - 450 x10E3/uL 146(L) 155 134(L)    Lab Results  Component Value Date/Time   VD25OH 44.1 10/16/2020 08:31 AM   VD25OH 37.7 08/16/2019 10:35 AM    Clinical ASCVD: No  The 10-year ASCVD risk score (Arnett DK, et al., 2019) is: 42.2%   Values used to calculate the score:     Age: 75 years     Sex: Female     Is Non-Hispanic African American: No     Diabetic: Yes     Tobacco smoker: No     Systolic Blood Pressure: 151 mmHg     Is BP treated: Yes     HDL Cholesterol: 44 mg/dL     Total Cholesterol: 144 mg/dL    Depression screen [facility] 2/9 01/12/2021 10/09/2020 07/03/2020  Decreased Interest 0 0 0  Down, Depressed, Hopeless 0 0 0  PHQ - 2 Score 0 0 0      Social History   Tobacco Use  Smoking Status Former   Packs/day: 1.00   Years: 30.00   Pack years: 30.00   Types: Cigarettes   Quit date: 04/11/1995   Years since quitting: 25.9  Smokeless Tobacco Never   BP Readings from Last 3 Encounters:  01/12/21 130/70  10/09/20 (!) 154/72  07/03/20 128/82   Pulse Readings from Last 3 Encounters:  01/12/21 60  10/09/20 62  07/03/20 62   Wt Readings from Last 3 Encounters:  01/12/21 208 lb 12.8 oz (94.7 kg)  10/09/20 211 lb 6.4 oz (95.9 kg)  07/03/20 207 lb 12.8 oz (94.3 kg)   BMI Readings from Last 3 Encounters:  01/12/21 35.84 kg/m  10/09/20 36.29 kg/m  07/03/20 35.67 kg/m    Assessment/Interventions: Review of patient past medical history, allergies, medications, health status, including review of consultants reports, laboratory and other test data, was performed as part of comprehensive evaluation and provision of chronic care management services.   SDOH:  (Social Determinants of Health) assessments and interventions performed: Yes  Financial Resource Strain: Low Risk     Difficulty of Paying Living Expenses: Not very hard    SDOH Screenings   Alcohol Screen: Low Risk    Last Alcohol Screening Score (AUDIT): 0  Depression (PHQ2-9): Low Risk    PHQ-2 Score: 0  Financial Resource Strain: Low Risk    Difficulty of Paying Living Expenses: Not very hard  Food Insecurity: Not on file  Housing: Not on file  Physical Activity: Not on file  Social Connections: Not on file  Stress: Not on file  Tobacco Use: Medium Risk   Smoking Tobacco Use: Former   Smokeless Tobacco Use: Never   Passive Exposure: Not on file  Transportation Needs: Not on file    [place]  No Known Allergies  Medications Reviewed Today     Reviewed by Kara [name], [facility] (Pharmacist) on 03/07/21 at 1147  Med List Status: <None>   Medication Order Taking? Sig Documenting Provider Last Dose Status Informant  Aflibercept (EYLEA) 2 MG/0.05ML SOLN [id number] No  [provider] Unknown Active   APPLE CIDER VINEGAR PO [id number] No Take 2 tablets by mouth daily.   Patient not taking: Reported on 02/06/2021   [provider] Not Taking Active Spouse/Significant Other  aspirin EC 81 MG tablet [id number] No Take 81 mg by mouth daily.  [provider] Taking Active Spouse/Significant Other  cholecalciferol (VITAMIN D) 1000 UNITS tablet [id number] No Take 2,000 Units by mouth daily.  [provider] Taking Active Spouse/Significant Other  clobetasol cream (TEMOVATE) 0.05 % [id number] No Apply to more severely affected areas of rash twice a day until improved. Avoid face, groin, underarms. [name], MD Taking Active   clobetasol ointment (TEMOVATE) 0.05 % [id number] No Apply to affected areas twice daily. Avoid applying to face, groin, and axilla. Use as directed. Risk of skin atrophy with long-term use reviewed. [name], MD Taking Active   doxycycline (MONODOX) 100 MG capsule [id number] No Take 1 capsule (100 mg total) by mouth 2 (two) times  daily. Take with food. Luiz Ochoa, NP Taking Active   Dulaglutide (TRULICITY) 1.5 VQ/2.5ZD SOPN [id number] No Inject 1.5 mg into the skin once a week. Kara Guise, MD Taking Active   Ferrous Gluconate 324 (37.5 Fe) MG TABS [id number] No Take 324 mg by mouth daily. [provider] Taking Active Spouse/Significant Other  furosemide (LASIX) 20 MG tablet [id number] No Take 20 mg by mouth 2 (two) times daily. [provider] Taking Active   hydrOXYzine (ATARAX/VISTARIL) 10 MG tablet [id number] No TAKE 1-3 TABLETS BY MOUTH EVERY NIGHT AS NEEDED FOR ITCH. [name], MD Taking Active   insulin detemir (LEVEMIR FLEXTOUCH) 100 UNIT/ML FlexPen [id number] No INJECT UP TO 75 UNITS UNDER THE SKIN DAILY Abernathy, Alyssa, NP Taking Active   Lactobacillus (PROBIOTIC ACIDOPHILUS) CAPS [id number] No Take 1 capsule by mouth daily. [provider] Taking Active   lisinopril (ZESTRIL) 10 MG tablet [id number] No Take 10 mg by mouth daily. [provider] Taking Active   methocarbamol (ROBAXIN) 500 MG tablet [id number] No Take 1 tablet (500 mg total) by mouth every 8 (eight) hours as needed for muscle spasms. Jonetta Osgood, NP Taking Active   Multiple Vitamin (MULTIVITAMIN) tablet [id number] No Take 1 tablet by mouth daily at 12 noon. [provider] Taking Active Spouse/Significant Other  mupirocin ointment (BACTROBAN) 2 % [id number] No Apply to open wounds and cover 1-2 times daily until healed. [name], MD Taking Active   NOVOLOG FLEXPEN 100 UNIT/ML FlexPen [id number] No INJECT [place] TWICE DAILY AS DIRECTED PER SLIDING SCALE. MAX DAILY DOSE IS 30 UNITS. DX E11.65. Kara Guise, MD Taking Active   pantoprazole (PROTONIX) 40 MG tablet [id number] No TAKE 1 TABLET(S) BY MOUTH DAILY Abernathy, Alyssa, NP Taking Active   pregabalin (LYRICA) 100 MG capsule [id number] No TAKE 1 CAPSULE BY MOUTH TWICE A DAY Kara Guise, MD Taking Active   rosuvastatin (CRESTOR) 20 MG tablet  [id number]  TAKE 1 TABLET BY MOUTH EVERYDAY AT BEDTIME Kara Guise, MD  Active   tamoxifen (NOLVADEX) 20 MG tablet [id number]  Take 1 tablet (20 mg total) by mouth daily. Jacquelin Hawking, NP  Active   traMADol-acetaminophen (ULTRACET) 37.5-325 MG tablet [id number] No TAKE 1 TABLET BY MOUTH 2 (  TWO) TIMES DAILY AS NEEDED FOR MODERATE PAIN. Jonetta Osgood, NP Taking Active   triamcinolone (KENALOG) 0.025 % cream [id number] No Apply 1 application topically 2 (two) times daily. Jonetta Osgood, NP Taking Active   triamcinolone (KENALOG) 0.1 % [id number] No Apply 1 application topically 2 (two) times daily as needed (Rash). Avoid face, groin, underarms. [name], MD Taking Active   verapamil (CALAN) 40 MG tablet [id number] No Take 1 tablet (40 mg total) by mouth 2 (two) times daily. Jonetta Osgood, NP Taking Active             Patient Active Problem List   Diagnosis Date Noted   Type 2 diabetes mellitus with stage 4 chronic kidney disease, with long-term current use of insulin ([place]) 10/17/2020   Synovial cyst of right popliteal space 12/18/2019   Encounter for general adult medical examination with abnormal findings 10/17/2019   Bilateral lower extremity edema 10/17/2019   Atopic dermatitis 10/17/2019   Chest pain 08/16/2019   Alopecia 08/16/2019   Fever 03/22/2019   Cellulitis of right breast 03/22/2019   CKD (chronic kidney disease), stage IV ([place]) 03/22/2019   Aortic atherosclerosis ([place]) 03/22/2019   GERD (gastroesophageal reflux disease) 03/21/2019   Rash 03/21/2019   Type 2 diabetes mellitus with hyperglycemia ([place]) 01/07/2019   Ductal carcinoma in situ (DCIS) of right breast 01/01/2019   Screening for breast cancer 10/03/2018   Dyspnea on exertion 10/03/2018   Dysuria 10/03/2018   Diabetic polyneuropathy associated with type 2 diabetes mellitus ([place]) 06/29/2018   Type 2 diabetes mellitus with diabetic neuropathy ([place]) 06/29/2018   Uncontrolled type 2 diabetes mellitus  with hyperglycemia ([place]) 03/30/2018   Inflammatory polyarthropathy ([place]) 03/30/2018   Benign essential tremor 03/30/2018   Irritable bowel syndrome with diarrhea 12/07/2017   Essential hypertension 12/07/2017   Mixed hyperlipidemia 12/07/2017    Immunization History  Administered Date(s) Administered   Influenza, High Dose Seasonal PF 01/24/2017, 02/02/2018, 12/18/2018, 04/06/2019, 01/07/2020   Influenza-Unspecified 01/20/2014, 12/18/2018, 01/04/2021   Pneumococcal Polysaccharide-23 02/02/2018, 05/04/2018    Conditions to be addressed/monitored:  HTN, GERD, Type II DM w/ CKD, HLD, Breast Cancer  Care Plan : [place]  Updates made by Kara [name], [facility] since 03/07/2021 12:00 AM     Problem: HTN, GERD, Type II DM w/ CKD, HLD, Breast Cancer   Priority: High  Onset Date: 10/03/2020     Long-Range Goal: Disease Mgmt   Start Date: 10/03/2020  Expected End Date: 03/07/2022  Recent Progress: On track  Priority: High  Note:   Current Barriers:  Unable to achieve control of fasting glucose   Pharmacist Clinical Goal(s):  Patient will achieve adherence to monitoring guidelines and medication adherence to achieve therapeutic efficacy achieve control of fasting glucose as evidenced by monitoring contact provider office for questions/concerns as evidenced notation of same in electronic health record through collaboration with PharmD and provider.   Interventions: 1:1 collaboration with Kara Guise, MD regarding development and update of comprehensive plan of care as evidenced by provider attestation and co-signature Inter-disciplinary care team collaboration (see longitudinal plan of care) Comprehensive medication review performed; medication list updated in electronic medical record  Hypertension (BP goal <130/80) -Controlled -Current treatment: Verapamil 19m daily Lisinopril 159mdaily -Medications previously tried: hydralazine 1054mID  -Current home  readings: 135/70s, reads about once every couple weeks -Current dietary habits: tries to watch what she eats, eats chicken/fish, stir fry -Reports no hypotensive/hypertensive symptoms including dizziness and fatigue -Educated on BP goals and benefits  of medications for prevention of heart attack, stroke and kidney damage; Daily salt intake goal < 2300 mg; Exercise goal of 150 minutes per week; Importance of home blood pressure monitoring; Symptoms of hypotension and importance of maintaining adequate hydration; -Counseled to monitor BP at home a few times per week, document, and provide log at future appointments -Recommended to continue current medication  Hyperlipidemia: (LDL goal < 70) -Not ideally controlled -Current treatment: Rosuvastatin 59m daily -Medications previously tried: none noted  -Current dietary patterns: see above -Educated on Cholesterol goals;  Benefits of statin for ASCVD risk reduction; Importance of limiting foods high in cholesterol; Exercise goal of 150 minutes per week; -Recommended to continue current medication Recommended repeat lipid panel at next visit which is a physical with Alyssa N  Continue meds with no changes  Diabetes w/neuropathy (A1c goal <7%) -Controlled -Current medications: Novolog twice daily via sliding scale with max daily dose of 15 units Levemir 60 units daily Trulicity 11.6XWonce weekly - Mondays Lyrica 105mtwice daily -Medications previously tried: glimepiride  -Current home glucose readings fasting glucose: 104-133 usually, reports one 77,79 post prandial glucose: 172-188 -Reports hypoglycemic symptoms some mornings when she is having the upper 70s blood sugars where she gets shaky and cannot get out of bed.  She takes glucose tablets and this usually resolves. -Current exercise: minimal -Educated on A1c and blood sugar goals; Exercise goal of 150 minutes per week; Prevention and management of hypoglycemic  episodes; Benefits of routine self-monitoring of blood sugar; -Counseled to check feet daily and get yearly eye exams -Counseled on diet and exercise extensively Recommended to continue current medication Recommended recheck A1c at upcoming physical and to bring in glucose log.   Patient has been using Levemir 60 units at bedtime and feels this is working well. Trulicity was approved for PAP so she is getting that for free through the end of the year. Continue current meds for now and FU in 2 months  GERD (Goal: Minimize symptoms) -Controlled -Current treatment  Pantoprazole 4066maily -Medications previously tried: none noted -Denies symptoms, continues to work on triggers  -Recommended to continue current medication  Patient Goals/Self-Care Activities Patient will:  - take medications as prescribed check glucose daily, document, and provide at future appointments check blood pressure at least once per week, document, and provide at future appointments collaborate with provider on medication access solutions  Follow Up Plan: The care management team will reach out to the patient again over the next 90 days.        Medication Assistance: On Trulicity PAP  Compliance/Adherence/Medication fill history: Care Gaps: Pneumonia, Shingles, and TDAP vaccine  Star-Rating Drugs: Rosuvastatin 20 mg 11/08/58/45 DS Trulicity 1.5 MG/WU/9.8 10/16/20 28 DS  Lisinopril 10 mg 09/14/20 90 DS ([facility], reason why its not showing correct refills, per patient)  Patient's preferred pharmacy is:  [id number] [place] [facility] [address]: [phone]: [phone] [place]: [phone]: [phone]/pharmacy [facility][id number], [place] - [address] [id number]: [phone] pill box? Yes Pt endorses 100% compliance  We discussed: Benefits of medication synchronization, packaging and delivery as well as enhanced pharmacist oversight with [employer]. Patient decided to: Continue current  medication management strategy  Care Plan and Follow Up Patient Decision:  Patient agrees to Care Plan and Follow-up.  Plan: The care management team will reach out to the patient again over the next 90 days.

## 2021-03-07 ENCOUNTER — Telehealth: Payer: Medicare HMO

## 2021-03-07 ENCOUNTER — Other Ambulatory Visit: Payer: Self-pay

## 2021-03-07 ENCOUNTER — Ambulatory Visit: Payer: Medicare HMO | Admitting: Student-PharmD

## 2021-03-07 DIAGNOSIS — I10 Essential (primary) hypertension: Secondary | ICD-10-CM

## 2021-03-07 DIAGNOSIS — Z794 Long term (current) use of insulin: Secondary | ICD-10-CM

## 2021-03-07 NOTE — Patient Instructions (Addendum)
Visit Information   Goals Addressed             This Visit's Progress    Track and Manage My Blood Pressure-Hypertension   On track    Timeframe:  Long-Range Goal Priority:  High Start Date:   10/03/20                          Expected End Date:  03/07/22                  Follow Up Date 07/19/21    - check blood pressure 3 times per week - choose a place to take my blood pressure (home, clinic or office, retail store) - write blood pressure results in a log or diary    Why is this important?   You won't feel high blood pressure, but it can still hurt your blood vessels.  High blood pressure can cause heart or kidney problems. It can also cause a stroke.  Making lifestyle changes like losing a little weight or eating less salt will help.  Checking your blood pressure at home and at different times of the day can help to control blood pressure.  If the doctor prescribes medicine remember to take it the way the doctor ordered.  Call the office if you cannot afford the medicine or if there are questions about it.     Notes:        Patient Care Plan: CCM Pharmacy Care Plan     Problem Identified: HTN, GERD, Type II DM w/ CKD, HLD, Breast Cancer   Priority: High  Onset Date: 10/03/2020     Long-Range Goal: Disease Mgmt   Start Date: 10/03/2020  Expected End Date: 03/07/2022  Recent Progress: On track  Priority: High  Note:   Current Barriers:  Unable to achieve control of fasting glucose   Pharmacist Clinical Goal(s):  Patient will achieve adherence to monitoring guidelines and medication adherence to achieve therapeutic efficacy achieve control of fasting glucose as evidenced by monitoring contact provider office for questions/concerns as evidenced notation of same in electronic health record through collaboration with PharmD and provider.   Interventions: 1:1 collaboration with Lavera Guise, MD regarding development and update of comprehensive plan of care as  evidenced by provider attestation and co-signature Inter-disciplinary care team collaboration (see longitudinal plan of care) Comprehensive medication review performed; medication list updated in electronic medical record  Hypertension (BP goal <130/80) -Controlled -Current treatment: Verapamil 40mg  daily Lisinopril 10mg  daily -Medications previously tried: hydralazine 10mg  TID  -Current home readings: 135/70s, reads about once every couple weeks -Current dietary habits: tries to watch what she eats, eats chicken/fish, stir fry -Reports no hypotensive/hypertensive symptoms including dizziness and fatigue -Educated on BP goals and benefits of medications for prevention of heart attack, stroke and kidney damage; Daily salt intake goal < 2300 mg; Exercise goal of 150 minutes per week; Importance of home blood pressure monitoring; Symptoms of hypotension and importance of maintaining adequate hydration; -Counseled to monitor BP at home a few times per week, document, and provide log at future appointments -Recommended to continue current medication  Hyperlipidemia: (LDL goal < 70) -Not ideally controlled -Current treatment: Rosuvastatin 20mg  daily -Medications previously tried: none noted  -Current dietary patterns: see above -Educated on Cholesterol goals;  Benefits of statin for ASCVD risk reduction; Importance of limiting foods high in cholesterol; Exercise goal of 150 minutes per week; -Recommended to continue current medication Recommended repeat  lipid panel at next visit which is a physical with Alyssa N  Continue meds with no changes  Diabetes w/neuropathy (A1c goal <7%) -Controlled -Current medications: Novolog twice daily via sliding scale with max daily dose of 15 units Levemir 60 units daily Trulicity 1.5mg  once weekly - Mondays Lyrica 100mg  twice daily -Medications previously tried: glimepiride  -Current home glucose readings fasting glucose: 104-133 usually,  reports one 77,79 post prandial glucose: 172-188 -Reports hypoglycemic symptoms some mornings when she is having the upper 70s blood sugars where she gets shaky and cannot get out of bed.  She takes glucose tablets and this usually resolves. -Current exercise: minimal -Educated on A1c and blood sugar goals; Exercise goal of 150 minutes per week; Prevention and management of hypoglycemic episodes; Benefits of routine self-monitoring of blood sugar; -Counseled to check feet daily and get yearly eye exams -Counseled on diet and exercise extensively Recommended to continue current medication Recommended recheck A1c at upcoming physical and to bring in glucose log.   Patient has been using Levemir 60 units at bedtime and feels this is working well. Trulicity was approved for PAP so she is getting that for free through the end of the year. Continue current meds for now and FU in 2 months  GERD (Goal: Minimize symptoms) -Controlled -Current treatment  Pantoprazole 40mg  daily -Medications previously tried: none noted -Denies symptoms, continues to work on triggers  -Recommended to continue current medication  Patient Goals/Self-Care Activities Patient will:  - take medications as prescribed check glucose daily, document, and provide at future appointments check blood pressure at least once per week, document, and provide at future appointments collaborate with provider on medication access solutions  Follow Up Plan: The care management team will reach out to the patient again over the next 90 days.       Patient verbalizes understanding of instructions provided today and agrees to view in [place].  Telephone follow up appointment with pharmacy team member scheduled for: 06/2021  Alena Bills, [facility]  Clinical Pharmacist [phone]

## 2021-03-20 ENCOUNTER — Telehealth: Payer: Self-pay

## 2021-03-20 ENCOUNTER — Telehealth: Payer: Self-pay | Admitting: Student-PharmD

## 2021-03-20 NOTE — Telephone Encounter (Signed)
Spoke to pt about re-enrollment for [employer] on [employer].  Pt advised that the CCM person will mail her the form and once she completes her portion she will bring it to the office for [place] to complete and we will fax to [place] [facility].  Pt is to bring her proof of income.

## 2021-03-20 NOTE — Progress Notes (Signed)
Chronic Care Management Pharmacy Assistant   Name: Kara [name]  MRN: [id number] DOB: 04-Mar-1946  Reason for Encounter: Renew PAP  Medications: Outpatient Encounter Medications as of 03/20/2021  Medication Sig   Aflibercept (EYLEA) 2 MG/0.05ML SOLN    APPLE CIDER VINEGAR PO Take 2 tablets by mouth daily.  (Patient not taking: Reported on 02/06/2021)   aspirin EC 81 MG tablet Take 81 mg by mouth daily.    cholecalciferol (VITAMIN D) 1000 UNITS tablet Take 2,000 Units by mouth daily.    clobetasol cream (TEMOVATE) 0.05 % Apply to more severely affected areas of rash twice a day until improved. Avoid face, groin, underarms.   clobetasol ointment (TEMOVATE) 0.05 % Apply to affected areas twice daily. Avoid applying to face, groin, and axilla. Use as directed. Risk of skin atrophy with long-term use reviewed.   doxycycline (MONODOX) 100 MG capsule Take 1 capsule (100 mg total) by mouth 2 (two) times daily. Take with food.   Dulaglutide (TRULICITY) 1.5 DJ/2.4QA SOPN Inject 1.5 mg into the skin once a week.   Ferrous Gluconate 324 (37.5 Fe) MG TABS Take 324 mg by mouth daily.   furosemide (LASIX) 20 MG tablet Take 20 mg by mouth 2 (two) times daily.   hydrOXYzine (ATARAX/VISTARIL) 10 MG tablet TAKE 1-3 TABLETS BY MOUTH EVERY NIGHT AS NEEDED FOR ITCH.   insulin detemir (LEVEMIR FLEXTOUCH) 100 UNIT/ML FlexPen INJECT UP TO 75 UNITS UNDER THE SKIN DAILY   Lactobacillus (PROBIOTIC ACIDOPHILUS) CAPS Take 1 capsule by mouth daily.   lisinopril (ZESTRIL) 10 MG tablet Take 10 mg by mouth daily.   methocarbamol (ROBAXIN) 500 MG tablet Take 1 tablet (500 mg total) by mouth every 8 (eight) hours as needed for muscle spasms.   Multiple Vitamin (MULTIVITAMIN) tablet Take 1 tablet by mouth daily at 12 noon.   mupirocin ointment (BACTROBAN) 2 % Apply to open wounds and cover 1-2 times daily until healed.   NOVOLOG FLEXPEN 100 UNIT/ML FlexPen INJECT  TWICE DAILY AS DIRECTED PER SLIDING SCALE. MAX DAILY DOSE  IS 30 UNITS. DX E11.65.   pantoprazole (PROTONIX) 40 MG tablet TAKE 1 TABLET(S) BY MOUTH DAILY   pregabalin (LYRICA) 100 MG capsule TAKE 1 CAPSULE BY MOUTH TWICE A DAY   rosuvastatin (CRESTOR) 20 MG tablet TAKE 1 TABLET BY MOUTH EVERYDAY AT BEDTIME   tamoxifen (NOLVADEX) 20 MG tablet Take 1 tablet (20 mg total) by mouth daily.   traMADol-acetaminophen (ULTRACET) 37.5-325 MG tablet TAKE 1 TABLET BY MOUTH 2 (TWO) TIMES DAILY AS NEEDED FOR MODERATE PAIN.   triamcinolone (KENALOG) 0.025 % cream Apply 1 application topically 2 (two) times daily.   triamcinolone (KENALOG) 0.1 % Apply 1 application topically 2 (two) times daily as needed (Rash). Avoid face, groin, underarms.   verapamil (CALAN) 40 MG tablet Take 1 tablet (40 mg total) by mouth 2 (two) times daily.   No facility-administered encounter medications on file as of 03/20/2021.   Renew patient assistance application form filled out to [employer] for [employer]. Waiting for patient and provider to complete and sign documentation. Called patient to inquire if they wanted the application mailed to them or if they wanted to come into the office. Patient is required to sign application and to bring/have proof of income. She stated she would be willing to come into office to bring proof of income and sign application once all paperwork came in the mail she would like it mailed to their residence address [place]  [place] [address].  Follow-Up:Pharmacist Review  Charlann Lange, [place] Pharmacist Assistant [phone]

## 2021-03-21 ENCOUNTER — Telehealth: Payer: Self-pay | Admitting: Student-PharmD

## 2021-03-21 DIAGNOSIS — I10 Essential (primary) hypertension: Secondary | ICD-10-CM

## 2021-03-21 DIAGNOSIS — K219 Gastro-esophageal reflux disease without esophagitis: Secondary | ICD-10-CM

## 2021-03-21 DIAGNOSIS — E1165 Type 2 diabetes mellitus with hyperglycemia: Secondary | ICD-10-CM

## 2021-03-21 NOTE — Progress Notes (Signed)
ERROR

## 2021-03-30 ENCOUNTER — Telehealth: Payer: Self-pay

## 2021-03-30 NOTE — Telephone Encounter (Signed)
Faxed pt asst paperwork to [facility] for TRULICITY 32/5/49

## 2021-04-07 ENCOUNTER — Other Ambulatory Visit: Payer: Self-pay | Admitting: Nurse Practitioner

## 2021-04-07 DIAGNOSIS — N184 Chronic kidney disease, stage 4 (severe): Secondary | ICD-10-CM

## 2021-04-07 DIAGNOSIS — E1122 Type 2 diabetes mellitus with diabetic chronic kidney disease: Secondary | ICD-10-CM

## 2021-04-09 ENCOUNTER — Other Ambulatory Visit: Payer: Self-pay

## 2021-04-09 MED ORDER — PANTOPRAZOLE SODIUM 40 MG PO TBEC: DELAYED_RELEASE_TABLET | ORAL | 1 refills | Status: DC

## 2021-04-11 ENCOUNTER — Telehealth: Payer: Self-pay

## 2021-04-11 NOTE — Telephone Encounter (Signed)
Faxed pt asst paperwork to [facility] for TRULICITY1.5mg    04/10/21@ 210pm

## 2021-04-13 ENCOUNTER — Other Ambulatory Visit: Payer: Self-pay

## 2021-04-13 ENCOUNTER — Encounter: Payer: Self-pay | Admitting: Nurse Practitioner

## 2021-04-13 ENCOUNTER — Ambulatory Visit (INDEPENDENT_AMBULATORY_CARE_PROVIDER_SITE_OTHER): Payer: Medicare HMO | Admitting: Nurse Practitioner

## 2021-04-13 VITALS — BP 140/60 | HR 52 | Temp 98.0°F | Resp 16 | Ht 64.0 in | Wt 206.6 lb

## 2021-04-13 DIAGNOSIS — E1165 Type 2 diabetes mellitus with hyperglycemia: Secondary | ICD-10-CM | POA: Diagnosis not present

## 2021-04-13 DIAGNOSIS — Z794 Long term (current) use of insulin: Secondary | ICD-10-CM

## 2021-04-13 DIAGNOSIS — I700 Atherosclerosis of aorta: Secondary | ICD-10-CM

## 2021-04-13 DIAGNOSIS — E1122 Type 2 diabetes mellitus with diabetic chronic kidney disease: Secondary | ICD-10-CM | POA: Diagnosis not present

## 2021-04-13 DIAGNOSIS — N184 Chronic kidney disease, stage 4 (severe): Secondary | ICD-10-CM

## 2021-04-13 DIAGNOSIS — M159 Polyosteoarthritis, unspecified: Secondary | ICD-10-CM | POA: Diagnosis not present

## 2021-04-13 LAB — POCT GLYCOSYLATED HEMOGLOBIN (HGB A1C): Hemoglobin A1C: 6.4 % — AB (ref 4.0–5.6)

## 2021-04-13 MED ORDER — TRAMADOL-ACETAMINOPHEN 37.5-325 MG PO TABS: 1.0000 | ORAL_TABLET | Freq: Two times a day (BID) | ORAL | 1 refills | Status: DC | PRN

## 2021-04-13 MED ORDER — LEVEMIR FLEXTOUCH 100 UNIT/ML ~~LOC~~ SOPN: PEN_INJECTOR | SUBCUTANEOUS | 2 refills | Status: DC

## 2021-04-13 MED ORDER — ROSUVASTATIN CALCIUM 20 MG PO TABS: 20.0000 mg | ORAL_TABLET | Freq: Every day | ORAL | 3 refills | Status: DC

## 2021-04-13 NOTE — Progress Notes (Signed)
[facility] [place], [place] [address]  Internal MEDICINE  Office Visit Note  Patient Name: Kara [name]  [id number]  [id number]  Date of Service: 04/13/2021  Chief Complaint  Patient presents with   Follow-up   Diabetes   Hyperlipidemia   Hypertension   Anemia    HPI Kara [name] presents with for a follow up visit for diabetes and hypertension. Her A1C is 6..4 today which is stable. She takes trulicity and needs to resubmit paperwork for financial assistance.    Current Medication: Outpatient Encounter Medications as of 04/13/2021  Medication Sig   Aflibercept (EYLEA) 2 MG/0.05ML SOLN    aspirin EC 81 MG tablet Take 81 mg by mouth daily.    cholecalciferol (VITAMIN D) 1000 UNITS tablet Take 2,000 Units by mouth daily.    Dulaglutide (TRULICITY) 1.5 ZY/6.0YT SOPN Inject 1.5 mg into the skin once a week.   Ferrous Gluconate 324 (37.5 Fe) MG TABS Take 324 mg by mouth daily.   furosemide (LASIX) 20 MG tablet Take 20 mg by mouth 2 (two) times daily.   hydrOXYzine (ATARAX/VISTARIL) 10 MG tablet TAKE 1-3 TABLETS BY MOUTH EVERY NIGHT AS NEEDED FOR ITCH.   Lactobacillus (PROBIOTIC ACIDOPHILUS) CAPS Take 1 capsule by mouth daily.   lisinopril (ZESTRIL) 10 MG tablet Take 10 mg by mouth daily.   methocarbamol (ROBAXIN) 500 MG tablet Take 1 tablet (500 mg total) by mouth every 8 (eight) hours as needed for muscle spasms.   Multiple Vitamin (MULTIVITAMIN) tablet Take 1 tablet by mouth daily at 12 noon.   mupirocin ointment (BACTROBAN) 2 % Apply to open wounds and cover 1-2 times daily until healed.   NOVOLOG FLEXPEN 100 UNIT/ML FlexPen INJECT [place] TWICE DAILY AS DIRECTED PER SLIDING SCALE. MAX DAILY DOSE IS 30 UNITS. DX E11.65.   pantoprazole (PROTONIX) 40 MG tablet TAKE 1 TABLET(S) BY MOUTH DAILY   pregabalin (LYRICA) 100 MG capsule TAKE 1 CAPSULE BY MOUTH TWICE A DAY   tamoxifen (NOLVADEX) 20 MG tablet Take 1 tablet (20 mg total) by mouth daily.   triamcinolone  (KENALOG) 0.025 % cream Apply 1 application topically 2 (two) times daily.   triamcinolone (KENALOG) 0.1 % Apply 1 application topically 2 (two) times daily as needed (Rash). Avoid face, groin, underarms.   [DISCONTINUED] clobetasol cream (TEMOVATE) 0.05 % Apply to more severely affected areas of rash twice a day until improved. Avoid face, groin, underarms.   [DISCONTINUED] clobetasol ointment (TEMOVATE) 0.05 % Apply to affected areas twice daily. Avoid applying to face, groin, and axilla. Use as directed. Risk of skin atrophy with long-term use reviewed.   [DISCONTINUED] doxycycline (MONODOX) 100 MG capsule Take 1 capsule (100 mg total) by mouth 2 (two) times daily. Take with food.   [DISCONTINUED] LEVEMIR FLEXTOUCH 100 UNIT/ML FlexTouch Pen INJECT UP TO 75 UNITS UNDER THE SKIN DAILY   [DISCONTINUED] rosuvastatin (CRESTOR) 20 MG tablet TAKE 1 TABLET BY MOUTH EVERYDAY AT BEDTIME   [DISCONTINUED] traMADol-acetaminophen (ULTRACET) 37.5-325 MG tablet TAKE 1 TABLET BY MOUTH 2 (TWO) TIMES DAILY AS NEEDED FOR MODERATE PAIN.   [DISCONTINUED] verapamil (CALAN) 40 MG tablet Take 1 tablet (40 mg total) by mouth 2 (two) times daily.   insulin detemir (LEVEMIR FLEXTOUCH) 100 UNIT/ML FlexPen INJECT UP TO 75 UNITS UNDER THE SKIN DAILY, titrate as needed.   rosuvastatin (CRESTOR) 20 MG tablet Take 1 tablet (20 mg total) by mouth daily.   traMADol-acetaminophen (ULTRACET) 37.5-325 MG tablet Take 1 tablet by mouth 2 (two) times  daily as needed for moderate pain.   [DISCONTINUED] APPLE CIDER VINEGAR PO Take 2 tablets by mouth daily.  (Patient not taking: Reported on 02/06/2021)   No facility-administered encounter medications on file as of 04/13/2021.    Surgical History: Past Surgical History:  Procedure Laterality Date   BILATERAL SALPINGECTOMY     BREAST BIOPSY Right 2014   neg- core   BREAST BIOPSY Right 12/21/2018   [place] bx, venus clip,  DUCTAL CARCINOMA IN SITU   BREAST BIOPSY Right 12/21/2018   [place] bx,  ribbon clip,  FIBROEPITHELIAL PROLIFERATION WITH SCLEROSIS   BREAST BIOPSY Right 01/01/2019   Affirm bx "X" clip-path pending   BREAST DUCTAL SYSTEM EXCISION     BREAST EXCISIONAL BIOPSY Right 2014   neg   BREAST LUMPECTOMY Right 01/22/2019   path pending   CATARACT EXTRACTION     CHOLECYSTECTOMY     CYSTOSCOPY WITH STENT PLACEMENT Left 04/14/2015   Procedure: CYSTOSCOPY WITH STENT PLACEMENT;  Surgeon: Hollice Espy, MD;  Location: [facility];  Service: Urology;  Laterality: Left;   EXTRACORPOREAL SHOCK WAVE LITHOTRIPSY Left 09/29/2014   Procedure: EXTRACORPOREAL SHOCK WAVE LITHOTRIPSY (ESWL);  Surgeon: Hollice Espy, MD;  Location: [facility];  Service: Urology;  Laterality: Left;   EYE SURGERY     bilateral cataract   IRRIGATION AND DEBRIDEMENT ABSCESS Right 03/24/2019   Procedure: IRRIGATION AND DEBRIDEMENT ABSCESS RIGHT BREAST;  Surgeon: Herbert Pun, MD;  Location: [facility];  Service: General;  Laterality: Right;   LAPAROSCOPIC OOPHERECTOMY Left    PARTIAL MASTECTOMY WITH NEEDLE LOCALIZATION Right 01/22/2019   Procedure: PARTIAL MASTECTOMY WITH NEEDLE LOCALIZATION;  Surgeon: Herbert Pun, MD;  Location: [facility];  Service: General;  Laterality: Right;   TONSILLECTOMY     TUBAL LIGATION     URETEROSCOPY WITH HOLMIUM LASER LITHOTRIPSY Left 04/14/2015   Procedure: URETEROSCOPY WITH HOLMIUM LASER LITHOTRIPSY;  Surgeon: Hollice Espy, MD;  Location: [facility];  Service: Urology;  Laterality: Left;    Medical History: Past Medical History:  Diagnosis Date   Anemia    Arthropathy    Benign neoplasm of breast    Breast cancer ([place])    Chest pain    Chronic kidney disease    30% kidney function   Diabetes (HCC)    Diabetic retinopathy ([place])    Dyspnea    Edema    History of kidney stones    Hyperlipemia    Hypersomnia with sleep apnea    Hypertension    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HCC)    Lumbago    Osteoarthrosis    Ovarian failure    Personal history  of radiation therapy    SOB (shortness of breath)     Family History: Family History  Problem Relation Age of Onset   Breast cancer Maternal Grandmother 88   Colon cancer Mother    Lung cancer Father     Social History   Socioeconomic History   Marital status: Married    Spouse name: Not on file   Number of children: Not on file   Years of education: Not on file   Highest education level: Not on file  Occupational History   Not on file  Tobacco Use   Smoking status: Former    Packs/day: 1.00    Years: 30.00    Pack years: 30.00    Types: Cigarettes    Quit date: 04/11/1995    Years since quitting: 26.1   Smokeless tobacco: Never  Vaping Use  Vaping Use: Never used  Substance and Sexual Activity   Alcohol use: No    Alcohol/week: 0.0 standard drinks   Drug use: No   Sexual activity: Not on file  Other Topics Concern   Not on file  Social History Narrative   Not on file   Social Determinants of Health   Financial Resource Strain: Low Risk    Difficulty of Paying Living Expenses: Not very hard  Food Insecurity: Not on file  Transportation Needs: Not on file  Physical Activity: Not on file  Stress: Not on file  Social Connections: Not on file  Intimate Partner Violence: Not on file      Review of Systems  Constitutional:  Negative for chills, fatigue and unexpected weight change.  HENT:  Negative for congestion, rhinorrhea, sneezing and sore throat.   Eyes:  Negative for redness.  Respiratory:  Negative for cough, chest tightness and shortness of breath.   Cardiovascular:  Negative for chest pain and palpitations.  Gastrointestinal:  Negative for abdominal pain, constipation, diarrhea, nausea and vomiting.  Genitourinary:  Negative for dysuria and frequency.  Musculoskeletal:  Negative for arthralgias, back pain, joint swelling and neck pain.  Skin:  Negative for rash.  Neurological: Negative.  Negative for tremors and numbness.  Hematological:   Negative for adenopathy. Does not bruise/bleed easily.  Psychiatric/Behavioral:  Negative for behavioral problems (Depression), sleep disturbance and suicidal ideas. The patient is not nervous/anxious.    Vital Signs: BP 140/60 Comment: 164/52   Pulse (!) 52    Temp 98 F (36.7 C)    Resp 16    Ht 5\' 4"  (1.626 m)    Wt 206 lb 9.6 oz (93.7 kg)    SpO2 95%    BMI 35.46 kg/m    Physical Exam Vitals reviewed.  Constitutional:      General: She is not in acute distress.    Appearance: Normal appearance. She is obese. She is not ill-appearing.  HENT:     Head: Normocephalic and atraumatic.  Eyes:     Pupils: Pupils are equal, round, and reactive to light.  Cardiovascular:     Rate and Rhythm: Normal rate and regular rhythm.  Pulmonary:     Effort: Pulmonary effort is normal. No respiratory distress.  Neurological:     Mental Status: She is alert and oriented to person, place, and time.  Psychiatric:        Mood and Affect: Mood normal.        Behavior: Behavior normal.       Assessment/Plan: 1. Type 2 diabetes mellitus with stage 4 chronic kidney disease, with long-term current use of insulin (HCC) A1C is stable at 6.4, refills of levemir ordered. Follow up for repeat A1C in 3 months.  - POCT HgB A1C - insulin detemir (LEVEMIR FLEXTOUCH) 100 UNIT/ML FlexPen; INJECT UP TO 75 UNITS UNDER THE SKIN DAILY, titrate as needed.  Dispense: 30 mL; Refill: 2  2. Atherosclerosis of aorta (HCC) Continue rosuvastatin as prescribed. No changes.  - rosuvastatin (CRESTOR) 20 MG tablet; Take 1 tablet (20 mg total) by mouth daily.  Dispense: 90 tablet; Refill: 3  3. Generalized osteoarthritis of multiple sites Ultracet refills ordered.  - traMADol-acetaminophen (ULTRACET) 37.5-325 MG tablet; Take 1 tablet by mouth 2 (two) times daily as needed for moderate pain.  Dispense: 60 tablet; Refill: 1   General Counseling: Jax verbalizes understanding of the findings of todays visit and agrees with  plan of treatment. I have discussed any  further diagnostic evaluation that may be needed or ordered today. We also reviewed her medications today. she has been encouraged to call the office with any questions or concerns that should arise related to todays visit.    Orders Placed This Encounter  Procedures   POCT HgB A1C    Meds ordered this encounter  Medications   rosuvastatin (CRESTOR) 20 MG tablet    Sig: Take 1 tablet (20 mg total) by mouth daily.    Dispense:  90 tablet    Refill:  3   traMADol-acetaminophen (ULTRACET) 37.5-325 MG tablet    Sig: Take 1 tablet by mouth 2 (two) times daily as needed for moderate pain.    Dispense:  60 tablet    Refill:  1    Not to exceed 2 additional fills before 03/05/2021   insulin detemir (LEVEMIR FLEXTOUCH) 100 UNIT/ML FlexPen    Sig: INJECT UP TO 75 UNITS UNDER THE SKIN DAILY, titrate as needed.    Dispense:  30 mL    Refill:  2    Please provide 10 pens at a time for 30 day supply.    Return in about 3 months (around 07/12/2021) for F/U, Recheck A1C, Tane Biegler PCP.   Total time spent:30 Minutes Time spent includes review of chart, medications, test results, and follow up plan with the patient.   [place] Controlled Substance Database was reviewed by me.  This patient was seen by Jonetta Osgood, FNP-C in collaboration with Dr. Clayborn Bigness as a part of collaborative care agreement.   Dean Goldner R. Valetta Fuller, MSN, FNP-C Internal medicine

## 2021-04-18 DIAGNOSIS — H34832 Tributary (branch) retinal vein occlusion, left eye, with macular edema: Secondary | ICD-10-CM | POA: Diagnosis not present

## 2021-04-18 DIAGNOSIS — H524 Presbyopia: Secondary | ICD-10-CM | POA: Diagnosis not present

## 2021-04-30 ENCOUNTER — Other Ambulatory Visit: Payer: Self-pay

## 2021-04-30 ENCOUNTER — Ambulatory Visit (INDEPENDENT_AMBULATORY_CARE_PROVIDER_SITE_OTHER): Payer: Medicare HMO | Admitting: Dermatology

## 2021-04-30 DIAGNOSIS — D485 Neoplasm of uncertain behavior of skin: Secondary | ICD-10-CM

## 2021-04-30 DIAGNOSIS — L120 Bullous pemphigoid: Secondary | ICD-10-CM | POA: Diagnosis not present

## 2021-04-30 DIAGNOSIS — L820 Inflamed seborrheic keratosis: Secondary | ICD-10-CM

## 2021-04-30 DIAGNOSIS — L219 Seborrheic dermatitis, unspecified: Secondary | ICD-10-CM | POA: Diagnosis not present

## 2021-04-30 DIAGNOSIS — L859 Epidermal thickening, unspecified: Secondary | ICD-10-CM | POA: Diagnosis not present

## 2021-04-30 MED ORDER — DOXYCYCLINE MONOHYDRATE 100 MG PO CAPS: 100.0000 mg | ORAL_CAPSULE | Freq: Two times a day (BID) | ORAL | 5 refills | Status: DC

## 2021-04-30 MED ORDER — KETOCONAZOLE 2 % EX SHAM
MEDICATED_SHAMPOO | CUTANEOUS | 3 refills | Status: DC
Start: 2021-04-30 — End: 2022-03-21

## 2021-04-30 MED ORDER — CLOBETASOL PROPIONATE 0.05 % EX OINT
TOPICAL_OINTMENT | CUTANEOUS | 2 refills | Status: DC
Start: 2021-04-30 — End: 2022-02-25

## 2021-04-30 MED ORDER — FLUOCINOLONE ACETONIDE 0.01 % EX SOLN
CUTANEOUS | 1 refills | Status: DC
Start: 2021-04-30 — End: 2022-03-21

## 2021-04-30 NOTE — Progress Notes (Signed)
Follow-Up Visit   Subjective  Kara [name] is a 76 y.o. female who presents for the following: Follow-up. Here for 4 month f/u Bullous Pemphigoid. Areas are improved with current treatment, with flares off and on. She still has blisters coming up. She takes doxycycline 100mg  twice daily and uses clobetasol cream and [facility] 0.1% cream prn flares. No side effects from doxycycline. She also has itching and flaking of the scalp and ears x 2-3 months. She has used apple cider vinegar and Neosporin to areas on ears, which helps. She uses Pantene shampoo. She also has a wart that has come up on her thigh that she wants removed.   The following portions of the chart were reviewed this encounter and updated as appropriate:       Review of Systems:  No other skin or systemic complaints except as noted in HPI or Assessment and Plan.  Objective  Well appearing patient in no apparent distress; mood and affect are within normal limits.  A focused examination was performed including face, arms, legs, scalp. Relevant physical exam findings are noted in the Assessment and Plan.  legs, arms, back Few scattered pink crusted papules on the legs, arms, left knee, intermammary chest, sacrum; hyperpigmented macules on the lower legs.  R medial thigh x 1 Erythematous stuck-on, waxy papule  Left lower flank 1.8 x 1.3cm pink crusted plaque, present>6 mos per pt       Scalp, ears Crusted papules of the scalp and ears.    Assessment & Plan  Bullous pemphigoid legs, arms, back  Chronic condition- not clear, but controlled to pt satisfaction.    Continue mupirocin ointment. Apply to open sores daily and keep covered.    Continue Clobetasol spot treat to new, active lesions BID. Avoid applying to face, groin, and axilla. Use as directed. Risk of skin atrophy with long-term use reviewed.    Continue triamcinolone 0.1% cream to itchy areas body qd/bid. Pt has at home.    Continue Doxycycline 100  mg PO BID dsp #60 5Rf.   Topical steroids (such as triamcinolone, fluocinolone, fluocinonide, mometasone, clobetasol, halobetasol, betamethasone, hydrocortisone) can cause thinning and lightening of the skin if they are used for too long in the same area. Your physician has selected the right strength medicine for your problem and area affected on the body. Please use your medication only as directed by your physician to prevent side effects.    Doxycycline should be taken with food to prevent nausea. Do not lay down for 30 minutes after taking. Be cautious with sun exposure and use good sun protection while on this medication. Pregnant women should not take this medication.         doxycycline (MONODOX) 100 MG capsule - legs, arms, back Take 1 capsule (100 mg total) by mouth 2 (two) times daily. Take with food.  clobetasol ointment (TEMOVATE) 0.05 % - legs, arms, back Apply to affected areas twice daily. Avoid applying to face, groin, and axilla. Use as directed. Risk of skin atrophy with long-term use reviewed.  Related Medications mupirocin ointment (BACTROBAN) 2 % Apply to open wounds and cover 1-2 times daily until healed.  Inflamed seborrheic keratosis R medial thigh x 1  vs Wart  Destruction of lesion - R medial thigh x 1  Destruction method: cryotherapy   Informed consent: discussed and consent obtained   Lesion destroyed using liquid nitrogen: Yes   Region frozen until ice ball extended beyond lesion: Yes   Outcome: patient  tolerated procedure well with no complications   Post-procedure details: wound care instructions given   Additional details:  Prior to procedure, discussed risks of blister formation, small wound, skin dyspigmentation, or rare scar following cryotherapy. Recommend Vaseline ointment to treated areas while healing.   Neoplasm of uncertain behavior of skin Left lower flank  Skin / nail biopsy Type of biopsy: tangential   Informed consent: discussed  and consent obtained   Patient was prepped and draped in usual sterile fashion: Area prepped with alcohol. Anesthesia: the lesion was anesthetized in a standard fashion   Anesthetic:  1% lidocaine w/ epinephrine 1-100,000 buffered w/ 8.4% NaHCO3 Instrument used: flexible razor blade   Hemostasis achieved with: pressure, aluminum chloride and electrodesiccation   Outcome: patient tolerated procedure well   Post-procedure details: wound care instructions given   Post-procedure details comment:  Ointment and small bandage applied  Specimen 1 - Surgical pathology Differential Diagnosis: Bullous Pemphigoid r/o BCC Patient has bx proven BP. Check Margins: No 1.8 x 1.3cm pink crusted plaque  BP r/o BCC  Seborrheic dermatitis Scalp, ears  vs Bullous Pemphigoid  Seborrheic Dermatitis  -  is a chronic persistent rash characterized by pinkness and scaling most commonly of the mid face but also can occur on the scalp (dandruff), ears; mid chest, mid back and groin.  It tends to be exacerbated by stress and cooler weather.  People who have neurologic disease may experience new onset or exacerbation of existing seborrheic dermatitis.  The condition is not curable but treatable and can be controlled.  Start ketoconazole 2% shampoo Apply to scalp and let sit several minutes before rinsing, use 2-3x/wk dsp 164mL 2Rf.  Start fluocinolone solution Apply to ears, scalp qd/bid prn itch dsp 16mL 1Rf.   ketoconazole (NIZORAL) 2 % shampoo - Scalp, ears Massage into scalp, let sit several minutes before rinsing. Use 2-3x/wk.  fluocinolone (SYNALAR) 0.01 % external solution - Scalp, ears Apply to affected areas ears and scalp 1-2 times a day as needed for itch.   Return in about 4 months (around 08/28/2021) for bullous pemphigoid.  IJamesetta Orleans, CMA, am acting as scribe for [name], MD .  Documentation: I have reviewed the above documentation for accuracy and completeness, and I agree with the  above.  [name] MD

## 2021-04-30 NOTE — Patient Instructions (Signed)

## 2021-05-01 DIAGNOSIS — H34832 Tributary (branch) retinal vein occlusion, left eye, with macular edema: Secondary | ICD-10-CM | POA: Diagnosis not present

## 2021-05-05 ENCOUNTER — Other Ambulatory Visit: Payer: Self-pay | Admitting: Nurse Practitioner

## 2021-05-05 DIAGNOSIS — I10 Essential (primary) hypertension: Secondary | ICD-10-CM

## 2021-05-07 ENCOUNTER — Telehealth: Payer: Self-pay

## 2021-05-07 ENCOUNTER — Telehealth: Payer: Self-pay | Admitting: Student-PharmD

## 2021-05-07 NOTE — Telephone Encounter (Signed)
-----   Message from [name], MD sent at 05/07/2021  1:15 PM EST ----- Skin , left lower flank EPIDERMAL HYPERPLASIA WITH FIBROSIS, WITH HEMORRHAGIC SCALE CRUST  Benign skin irritation, continue wound care (vaseline/Bandaid daily), avoid picking/rubbing - please call patient

## 2021-05-07 NOTE — Progress Notes (Addendum)
Diabetes (DM) Review Call  Kara [name], Kara [name] [id number] 89 years, Female  DOB: [birth year]  M: [phone]  Diabetes (DM) Review (HC) Completed by Charlann Lange on 05/07/2021 03:19 PM  Chart Review Is the patient enrolled in RPM with the glucometer?: No  A1C Reading #1 (last): 6.4 on: 04/13/2021  A1C Reading #2: 6.9 on: 01/11/2021  When was patient's last eye exam?: 09/19/2020  The patient's glycemic control has: Improved  What recent interventions have been made by any provider to improve the patient's conditions in the last 3 months?:   Office Visit: 04/13/21 Jonetta Osgood, NP. For type 2 diabetes. STOPPED Apple cider viegar. Consults: 04/30/21 Dermatology [name], MD. For Bullous pemphigoid. STARTED Flucinolone Acetonide 0.01% Apply to affected areas ears and scalp 1-2 times a day as needed for itch., and Ketoconazole 2% Massage into scalp, let sit several minutes before rinsing. Use 2-3x/wk. STOPPED Clobetasol cream.  Any recent hospitalizations or ED visits since last visit with CPP?: No  Is the patient currently on a STATIN medication?: No  Does patient have any of the following diagnosis codes that will exclude them from statin therapy?: Other  Details of why Patient is not taking a Statin: Unknown  Is the patient currently on ACE/ARB medication?: Yes  Adherence Review Adherence rates for STAR metric medications:  Lisinopril 10 mg 01/60/10 90 DS Trulicity 1.5 XN/2.3FT SOPN 10/16/20 28 DS  Adherence rates for medications indicated for disease state being reviewed: Trulicity 1.5 DD/2.2GU SOPN 10/16/20 28 DS  Does the patient have >5 day gap between last estimated fill dates for any of the above medications?: No  Disease State Questions Able to connect with the Patient?: Yes  Are you currently checking your blood sugars?: Yes  How often are you checking your blood sugar?: daily  Recent fasting blood sugars: 81  Recent pre-meal blood sugars:  198  Recent post-meal blood sugars: 207  Is the patient having any lows <70?: No  Is the patient having any fasting blood sugars above >170?: Yes  What are the symptoms?: Patient stated none.   Is the patient checking their feet daily/regularly?: Yes  Are there any cuts, swelling, blisters, redness, or any signs of infections?: No  When was your last dentist appointment? (6 Month recommendation): 01/2021  What diet changes have you made to improve your Blood Sugar level?: eating more home-cooked meals  What exercise are you doing to improve your Blood Sugar level?: no formal exercise  Charlann Lange, RMA Clinical Pharmacist Assistant [phone]  Pharmacist Review Adherence gaps identified?: No Drug Therapy Problems identified?: No Assessment: Controlled  5 minutes spent in review, coordination, and documentation.  Reviewed by: Alena Bills, PharmD Clinical Pharmacist [phone]

## 2021-05-07 NOTE — Telephone Encounter (Signed)
Advised pt of bx result/sh ?

## 2021-05-20 ENCOUNTER — Encounter: Payer: Self-pay | Admitting: Nurse Practitioner

## 2021-05-20 DIAGNOSIS — E1165 Type 2 diabetes mellitus with hyperglycemia: Secondary | ICD-10-CM | POA: Diagnosis not present

## 2021-05-20 DIAGNOSIS — K219 Gastro-esophageal reflux disease without esophagitis: Secondary | ICD-10-CM | POA: Diagnosis not present

## 2021-05-20 DIAGNOSIS — I10 Essential (primary) hypertension: Secondary | ICD-10-CM | POA: Diagnosis not present

## 2021-06-05 DIAGNOSIS — D631 Anemia in chronic kidney disease: Secondary | ICD-10-CM | POA: Diagnosis not present

## 2021-06-05 DIAGNOSIS — N184 Chronic kidney disease, stage 4 (severe): Secondary | ICD-10-CM | POA: Diagnosis not present

## 2021-06-05 DIAGNOSIS — E1122 Type 2 diabetes mellitus with diabetic chronic kidney disease: Secondary | ICD-10-CM | POA: Diagnosis not present

## 2021-06-05 DIAGNOSIS — I10 Essential (primary) hypertension: Secondary | ICD-10-CM | POA: Diagnosis not present

## 2021-06-05 DIAGNOSIS — N2581 Secondary hyperparathyroidism of renal origin: Secondary | ICD-10-CM | POA: Diagnosis not present

## 2021-06-05 DIAGNOSIS — R600 Localized edema: Secondary | ICD-10-CM | POA: Diagnosis not present

## 2021-06-12 DIAGNOSIS — I10 Essential (primary) hypertension: Secondary | ICD-10-CM | POA: Diagnosis not present

## 2021-06-12 DIAGNOSIS — E1122 Type 2 diabetes mellitus with diabetic chronic kidney disease: Secondary | ICD-10-CM | POA: Diagnosis not present

## 2021-06-12 DIAGNOSIS — N184 Chronic kidney disease, stage 4 (severe): Secondary | ICD-10-CM | POA: Diagnosis not present

## 2021-06-12 DIAGNOSIS — R600 Localized edema: Secondary | ICD-10-CM | POA: Diagnosis not present

## 2021-06-12 DIAGNOSIS — N2581 Secondary hyperparathyroidism of renal origin: Secondary | ICD-10-CM | POA: Diagnosis not present

## 2021-06-12 DIAGNOSIS — D631 Anemia in chronic kidney disease: Secondary | ICD-10-CM | POA: Diagnosis not present

## 2021-07-04 ENCOUNTER — Other Ambulatory Visit: Payer: Self-pay | Admitting: Internal Medicine

## 2021-07-04 DIAGNOSIS — E1142 Type 2 diabetes mellitus with diabetic polyneuropathy: Secondary | ICD-10-CM

## 2021-07-04 NOTE — Telephone Encounter (Signed)
Med sent to pharmacy.

## 2021-07-06 DIAGNOSIS — H34832 Tributary (branch) retinal vein occlusion, left eye, with macular edema: Secondary | ICD-10-CM | POA: Diagnosis not present

## 2021-07-13 ENCOUNTER — Other Ambulatory Visit: Payer: Self-pay

## 2021-07-13 ENCOUNTER — Encounter: Payer: Self-pay | Admitting: Nurse Practitioner

## 2021-07-13 ENCOUNTER — Ambulatory Visit (INDEPENDENT_AMBULATORY_CARE_PROVIDER_SITE_OTHER): Payer: Medicare HMO | Admitting: Nurse Practitioner

## 2021-07-13 VITALS — BP 140/60 | HR 67 | Temp 98.6°F | Resp 16 | Ht 64.0 in | Wt 199.0 lb

## 2021-07-13 DIAGNOSIS — N184 Chronic kidney disease, stage 4 (severe): Secondary | ICD-10-CM

## 2021-07-13 DIAGNOSIS — M159 Polyosteoarthritis, unspecified: Secondary | ICD-10-CM

## 2021-07-13 DIAGNOSIS — Z794 Long term (current) use of insulin: Secondary | ICD-10-CM

## 2021-07-13 DIAGNOSIS — E1142 Type 2 diabetes mellitus with diabetic polyneuropathy: Secondary | ICD-10-CM | POA: Diagnosis not present

## 2021-07-13 DIAGNOSIS — E1122 Type 2 diabetes mellitus with diabetic chronic kidney disease: Secondary | ICD-10-CM

## 2021-07-13 DIAGNOSIS — L209 Atopic dermatitis, unspecified: Secondary | ICD-10-CM

## 2021-07-13 LAB — POCT GLYCOSYLATED HEMOGLOBIN (HGB A1C): Hemoglobin A1C: 6.6 % — AB (ref 4.0–5.6)

## 2021-07-13 MED ORDER — PREGABALIN 100 MG PO CAPS: 100.0000 mg | ORAL_CAPSULE | Freq: Two times a day (BID) | ORAL | 2 refills | Status: DC

## 2021-07-13 MED ORDER — TRIAMCINOLONE ACETONIDE 0.025 % EX CREA: 1.0000 "application " | TOPICAL_CREAM | Freq: Two times a day (BID) | CUTANEOUS | 2 refills | Status: DC

## 2021-07-13 MED ORDER — TRAMADOL-ACETAMINOPHEN 37.5-325 MG PO TABS: 1.0000 | ORAL_TABLET | Freq: Two times a day (BID) | ORAL | 1 refills | Status: DC | PRN

## 2021-07-13 NOTE — Progress Notes (Signed)
[facility] ?[address] ?[place],  [address] ? ?Internal MEDICINE  ?Office Visit Note ? ?Patient Name: Kara [name] ? [id number]  ?[id number] ? ?Date of Service: 07/13/2021 ? ?Chief Complaint  ?Patient presents with  ? Follow-up  ? Diabetes  ? Hyperlipidemia  ? Hypertension  ? Anemia  ? ? ?HPI ?Kara [name] presents for follow-up visit for diabetes, hypertension and hyperlipidemia.  Her A1c is 6.6 today which is slightly elevated when compared to her previous A1c of 6.4 in December.  She does need refills of some of her medications.  Her vital signs are within normal limits.  She has also lost 7 pounds since her previous office visit in December. ? ? ? ? ?Current Medication: ?Outpatient Encounter Medications as of 07/13/2021  ?Medication Sig  ? Aflibercept (EYLEA) 2 MG/0.05ML SOLN   ? aspirin EC 81 MG tablet Take 81 mg by mouth daily.   ? cholecalciferol (VITAMIN D) 1000 UNITS tablet Take 2,000 Units by mouth daily.   ? clobetasol ointment (TEMOVATE) 0.05 % Apply to affected areas twice daily. Avoid applying to face, groin, and axilla. Use as directed. Risk of skin atrophy with long-term use reviewed.  ? doxycycline (MONODOX) 100 MG capsule Take 1 capsule (100 mg total) by mouth 2 (two) times daily. Take with food.  ? Dulaglutide (TRULICITY) 1.5 EX/9.3ZJ SOPN Inject 1.5 mg into the skin once a week.  ? Ferrous Gluconate 324 (37.5 Fe) MG TABS Take 324 mg by mouth daily.  ? fluocinolone (SYNALAR) 0.01 % external solution Apply to affected areas ears and scalp 1-2 times a day as needed for itch.  ? furosemide (LASIX) 20 MG tablet Take 20 mg by mouth 2 (two) times daily.  ? hydrOXYzine (ATARAX/VISTARIL) 10 MG tablet TAKE 1-3 TABLETS BY MOUTH EVERY NIGHT AS NEEDED FOR ITCH.  ? insulin detemir (LEVEMIR FLEXTOUCH) 100 UNIT/ML FlexPen INJECT UP TO 75 UNITS UNDER THE SKIN DAILY, titrate as needed.  ? ketoconazole (NIZORAL) 2 % shampoo Massage into scalp, let sit several minutes before rinsing. Use 2-3x/wk.  ?  Lactobacillus (PROBIOTIC ACIDOPHILUS) CAPS Take 1 capsule by mouth daily.  ? lisinopril (ZESTRIL) 10 MG tablet Take 10 mg by mouth daily.  ? methocarbamol (ROBAXIN) 500 MG tablet Take 1 tablet (500 mg total) by mouth every 8 (eight) hours as needed for muscle spasms.  ? Multiple Vitamin (MULTIVITAMIN) tablet Take 1 tablet by mouth daily at 12 noon.  ? mupirocin ointment (BACTROBAN) 2 % Apply to open wounds and cover 1-2 times daily until healed.  ? NOVOLOG FLEXPEN 100 UNIT/ML FlexPen INJECT [place] TWICE DAILY AS DIRECTED PER SLIDING SCALE. MAX DAILY DOSE IS 30 UNITS. DX E11.65.  ? pantoprazole (PROTONIX) 40 MG tablet TAKE 1 TABLET(S) BY MOUTH DAILY  ? rosuvastatin (CRESTOR) 20 MG tablet Take 1 tablet (20 mg total) by mouth daily.  ? tamoxifen (NOLVADEX) 20 MG tablet Take 1 tablet (20 mg total) by mouth daily.  ? verapamil (CALAN) 40 MG tablet TAKE 1 TABLET BY MOUTH TWICE A DAY  ? [DISCONTINUED] pregabalin (LYRICA) 100 MG capsule TAKE 1 CAPSULE BY MOUTH TWICE A DAY  ? [DISCONTINUED] traMADol-acetaminophen (ULTRACET) 37.5-325 MG tablet Take 1 tablet by mouth 2 (two) times daily as needed for moderate pain.  ? [DISCONTINUED] triamcinolone (KENALOG) 0.025 % cream Apply 1 application topically 2 (two) times daily.  ? [DISCONTINUED] triamcinolone (KENALOG) 0.1 % Apply 1 application topically 2 (two) times daily as needed (Rash). Avoid face, groin, underarms.  ? pregabalin (LYRICA) 100  MG capsule Take 1 capsule (100 mg total) by mouth 2 (two) times daily.  ? traMADol-acetaminophen (ULTRACET) 37.5-325 MG tablet Take 1 tablet by mouth 2 (two) times daily as needed for moderate pain.  ? triamcinolone (KENALOG) 0.025 % cream Apply 1 application. topically 2 (two) times daily.  ? ?No facility-administered encounter medications on file as of 07/13/2021.  ? ? ?Surgical History: ?Past Surgical History:  ?Procedure Laterality Date  ? BILATERAL SALPINGECTOMY    ? BREAST BIOPSY Right 2014  ? neg- core  ? BREAST BIOPSY Right 12/21/2018  ?  [place] bx, venus clip,  DUCTAL CARCINOMA IN SITU  ? BREAST BIOPSY Right 12/21/2018  ? [place] bx, ribbon clip,  FIBROEPITHELIAL PROLIFERATION WITH SCLEROSIS  ? BREAST BIOPSY Right 01/01/2019  ? Affirm bx "X" clip-path pending  ? BREAST DUCTAL SYSTEM EXCISION    ? BREAST EXCISIONAL BIOPSY Right 2014  ? neg  ? BREAST LUMPECTOMY Right 01/22/2019  ? path pending  ? CATARACT EXTRACTION    ? CHOLECYSTECTOMY    ? CYSTOSCOPY WITH STENT PLACEMENT Left 04/14/2015  ? Procedure: CYSTOSCOPY WITH STENT PLACEMENT;  Surgeon: Hollice Espy, MD;  Location: [facility];  Service: Urology;  Laterality: Left;  ? EXTRACORPOREAL SHOCK WAVE LITHOTRIPSY Left 09/29/2014  ? Procedure: EXTRACORPOREAL SHOCK WAVE LITHOTRIPSY (ESWL);  Surgeon: Hollice Espy, MD;  Location: [facility];  Service: Urology;  Laterality: Left;  ? EYE SURGERY    ? bilateral cataract  ? IRRIGATION AND DEBRIDEMENT ABSCESS Right 03/24/2019  ? Procedure: IRRIGATION AND DEBRIDEMENT ABSCESS RIGHT BREAST;  Surgeon: Herbert Pun, MD;  Location: [facility];  Service: General;  Laterality: Right;  ? LAPAROSCOPIC OOPHERECTOMY Left   ? PARTIAL MASTECTOMY WITH NEEDLE LOCALIZATION Right 01/22/2019  ? Procedure: PARTIAL MASTECTOMY WITH NEEDLE LOCALIZATION;  Surgeon: Herbert Pun, MD;  Location: [facility];  Service: General;  Laterality: Right;  ? TONSILLECTOMY    ? TUBAL LIGATION    ? URETEROSCOPY WITH HOLMIUM LASER LITHOTRIPSY Left 04/14/2015  ? Procedure: URETEROSCOPY WITH HOLMIUM LASER LITHOTRIPSY;  Surgeon: Hollice Espy, MD;  Location: [facility];  Service: Urology;  Laterality: Left;  ? ? ?Medical History: ?Past Medical History:  ?Diagnosis Date  ? Anemia   ? Arthropathy   ? Benign neoplasm of breast   ? Breast cancer ([place])   ? Chest pain   ? Chronic kidney disease   ? 30% kidney function  ? Diabetes ([place])   ? Diabetic retinopathy ([place])   ? Dyspnea   ? Edema   ? History of kidney stones   ? Hyperlipemia   ? Hypersomnia with sleep apnea   ? Hypertension   ? 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 Lumbago   ? Osteoarthrosis   ? Ovarian failure   ? Personal history of radiation therapy   ? SOB (shortness of breath)   ? ? ?Family History: ?Family History  ?Problem Relation Age of Onset  ? Breast cancer Maternal Grandmother 53  ? Colon cancer Mother   ? Lung cancer Father   ? ? ?Social History  ? ?Socioeconomic History  ? Marital status: Married  ?  Spouse name: Not on file  ? Number of children: Not on file  ? Years of education: Not on file  ? Highest education level: Not on file  ?Occupational History  ? Not on file  ?Tobacco Use  ? Smoking status: Former  ?  Packs/day: 1.00  ?  Years: 30.00  ?  Pack years: 30.00  ?  Types:  Cigarettes  ?  Quit date: 04/11/1995  ?  Years since quitting: 26.2  ? Smokeless tobacco: Never  ?Vaping Use  ? Vaping Use: Never used  ?Substance and Sexual Activity  ? Alcohol use: No  ?  Alcohol/week: 0.0 standard drinks  ? Drug use: No  ? Sexual activity: Not on file  ?Other Topics Concern  ? Not on file  ?Social History Narrative  ? Not on file  ? ?Social Determinants of Health  ? ?Financial Resource Strain: Low Risk   ? Difficulty of Paying Living Expenses: Not very hard  ?Food Insecurity: Not on file  ?Transportation Needs: Not on file  ?Physical Activity: Not on file  ?Stress: Not on file  ?Social Connections: Not on file  ?Intimate Partner Violence: Not on file  ? ? ? ? ?Review of Systems  ?Constitutional:  Negative for chills, fatigue and unexpected weight change.  ?HENT:  Negative for congestion, rhinorrhea, sneezing and sore throat.   ?Eyes:  Negative for redness.  ?Respiratory:  Negative for cough, chest tightness and shortness of breath.   ?Cardiovascular:  Negative for chest pain and palpitations.  ?Gastrointestinal:  Negative for abdominal pain, constipation, diarrhea, nausea and vomiting.  ?Genitourinary:  Negative for dysuria and frequency.  ?Musculoskeletal:  Negative for arthralgias, back pain, joint swelling and neck pain.  ?Skin:  Negative for  rash.  ?Neurological: Negative.  Negative for tremors and numbness.  ?Hematological:  Negative for adenopathy. Does not bruise/bleed easily.  ?Psychiatric/Behavioral:  Negative for behavioral problems ([place]

## 2021-07-17 ENCOUNTER — Telehealth: Payer: Medicare HMO | Admitting: Student-PharmD

## 2021-07-17 ENCOUNTER — Other Ambulatory Visit: Payer: Self-pay

## 2021-07-17 NOTE — Progress Notes (Unsigned)
Follow Up Pharmacist Visit  ? ?Kara [name] [id number] ?67 years, Female  DOB: [birth year]  M: [phone] ? ?Clinical Summary ?Patient Risk: Moderate ?Next CCM Follow Up: 03/20/22 ?Next AWV: 10/11/21 ?Summary for PCP: Patient has no health concerns at this time. Checks her blood pressure several times a week and reports controlled readings. Adherent to all medications. Patient's triglycerides were elevated at last check but she has since made healthy diet changes. Will monitor for improvement at next lipid panel check [facility]. ? ?Patient Chart Prep  ?Completed by Charlann Lange on 07/16/2021 ? ?Chronic Conditions ?Patient's Chronic Conditions: Hypertension (HTN), Gastroesophageal Reflux Disease (GERD), Diabetes (DM), Chronic Kidney Disease (CKD), Hyperlipidemia/Dyslipidemia (HLD) ? ?Doctor and Hospital Visits ?Were there PCP Visits since last visit with the Pharmacist?: Yes ?Visit #1: 07/13/21 Jonetta Osgood, NP. For Type 2 diabetes mellitus with stage 4 chronic kidney disease, with long-term current use of insulin. STOPPED Triamcinolone Acetonide 8.5% Crea 1 application 2 times daily PRN. STARTED Triamcinolone Acetonide 6.3149% Crea 1 application 2 times daily. ?Were there Specialist Visits since last visit with the Pharmacist?: Yes ?Visit #1: 06/11/21 Nephrology [facility] Norse, [place]. For Chronic kidney disease stage 4. No more information given. ?Visit #2: 06/12/21 Nephrology Candiss Norse, [place]. For Chronic kidney disease stage 4. No more information given. ?Was there a Hospital Visit in last 30 days?: No ?Were there other Hospital Visits since last visit with the Pharmacist?: No ? ?Medication Information ?Have there been any medication changes from PCP or Specialist since last visit with the Pharmacist?: No ?Are there any Medication adherence gaps (beyond 5 days past due)?: No ?Medication adherence rates for the STAR rating drugs:  ?Rosuvastatin 20 mg 05/05/21 90 DS ?Trulicity 1.5 FW/2.6VZ SOPN 10/16/20 28 DS  PA ?Lisinopril; 10 mg 06/06/21 90 DS ?List Patient's current Care Gaps: No current Care Gaps identified ? ?Pre-Call Questions  ?Completed by Charlann Lange on 07/16/2021 ? ?Are you able to connect with Patient: Yes ?Confirmed appointment date/time with patient/caregiver?: Yes ?Date/time of the appointment: 07/17/21 at 10:00 AM  ?Visit type: Phone ?Patient/Caregiver instructed to bring medications to appointment: Yes ?What, if any, problems do you have getting your medications from the pharmacy?: None ?What is your top health concern to discuss at your upcoming visit?: Patient stated none. ?Have you seen any other providers since your last visit?: No ? ?Disease Assessments ? ?Subjective Information ?Current BP: 140/60 ?Current HR: 67 ?taken on: 07/13/2021 ?Weight: 199 ?BMI: 34.16 ?Last GFR: 27 ?taken on: 10/16/2020 ?Visit Completed on: 07/17/2021 ?Why did the patient present?: CCM F/U Visit ?Lifestyle habits such as diet and exercise?: Exercise: walks 2-3x/week for 15-20 min and takes care of house, goes shopping ?Factors that may affect medication adherence?: Pill burden ?Is Patient using [facility]?: No ?Name and location of Current pharmacy: [place] ?Current Rx insurance plan: [employer] ?Are meds synced by current pharmacy?: No ?Are meds delivered by current pharmacy?: Yes - by mail order pharmacy ?Would patient benefit from direct intervention of clinical lead in dispensing process to optimize clinical outcomes?: Yes ?Are [facility] pharmacy services available where patient lives?: Yes ?Is patient disadvantaged to use [facility]?: Yes ?Does patient experience delays in picking up medications due to transportation concerns (getting to pharmacy)?: No ?Any additional demeanor/mood notes?: Patient presents via telephone and is very pleasant to speak with. ? ?Hypertension (HTN) ?Assess this condition today?: Yes ?Is patient able to obtain BP reading today?: No ?Goal: <130/80 mmHG ?Hypertension  Stage: Stage 2 (SBP >140 or DBP > 90) ?Is  Patient checking BP at home?: Yes ?Patient home BP readings are ranging: 126-138/52-61 ?How often does patient miss taking their blood pressure medications?: none ?Has patient experienced hypotension, dizziness, falls or bradycardia?: No ?Does Patient use RPM device?: No ?BP RPM device: Does patient qualify?: No ?We discussed: DASH diet:  following a diet emphasizing fruits and vegetables and [name]-fat dairy products along with whole grains, fish, poultry, and nuts. Reducing red meats and sugars., Targeting 150 minutes of aerobic activity per week, Proper Home BP Measurement, Recommend using a salt substitute to replace your salt if you need flavor. ?Assessment:: Controlled ?Drug: Furosemide '20mg'$  1 tab twice daily ?Assessment: Appropriate, Effective, Safe, Accessible ?Drug: Lisinopril '10mg'$  1 tab daily ?Assessment: Appropriate, Effective, Safe, Accessible ?Drug: Verapamil '40mg'$  1 tab twice daily ?Assessment: Appropriate, Effective, Safe, Accessible ?Additional Info: Patient currently checks her blood pressure about once every other day and reports controlled readings: 126/52, 136/61. Denies any symptoms of hypotension. Encouraged patient to continue to check BP regularly. ?Plan to: Continue medication therapy ?HC Follow up: 4 months ?Pharmacist Follow up: 8 months ? ?Hyperlipidemia/Dyslipidemia (HLD) ?Last Lipid panel on: 10/16/2020 ?TC (Goal<200): 144 ?LDL: 62 ?HDL (Goal>40): 44 ?TG (Goal<150): 234 ?ASCVD 10-year risk?is:: High (>20%) ?ASCVD Risk Score: 55.1 % ?Assess this condition today?: Yes ?LDL Goal: <70 ?Has patient tried and failed any HLD Medications?: No ?Check present secondary causes (below) that can lead to increased cholesterol levels (multi-choice optional): CKD ?We discussed: How to reduce cholesterol through diet/weight management and physical activity. ?Assessment:: Uncontrolled ?Drug: Rosuvastatin '20mg'$  1 tab daily ?Assessment: Appropriate, Effective, Safe,  Accessible ?Additional Info: Patient's triglycerides are elevated at last check. Scheduled for recheck on 10/11/21. Discussed with patient and she states she has made healthy diet changes since that last check. Will monitor for improvement. If no improvement, may consider addition of fish oil. ?Plan to: Continue medication therapy ?HC Follow up: 4 months ?Pharmacist Follow up: 8 months ? ?Exercise, Diet and Non-Drug Coordination Needs ?Additional exercise counseling points. We discussed: targeting at least 151 minutes per week of moderate-intensity aerobic exercise. ?Additional diet counseling points. We discussed: key components of the DASH diet ?Discussed Non-Drug Care Coordination Needs: Yes ?Does Patient have Medication financial barriers?: No ? ?[facility] Health-Related Social Needs Screening Tool -  ?SDOH  ?What is your living situation today? (ref #1): I have a steady place to live ?Think about the place you live. Do you have problems with any of the following? (ref #2): None of the above ?Within the past 12 months, you worried that your food would run out before you got money to buy more (ref #3): Never true ?Within the past 12 months, the food you bought just didn't last and you didn't have money to get more (ref #4): Never true ?In the past 12 months, has lack of reliable transportation kept you from medical appointments, meetings, work or from getting things needed for daily living? (ref #5): No ?In the past 12 months, has the electric, gas, oil, or water company threatened to shut off services in your home? (ref #6): No ?How often does anyone, including family and friends, physically hurt you? (ref #7): Never (1) ?How often does anyone, including family and friends, insult or talk down to you? (ref #8): Never (1) ?How often does anyone, including friends and family, threaten you with harm? (ref #9): Never (1) ?How often does anyone, including family and friends, scream or curse at  you? (ref #10): Never (1) ? ?Engagement Notes ?HC Chart Review:  10 min 07/16/21 ?HC Assessment call time spent: 5 min 07/16/21 ?CP Chart Prep: 10 min 07/02/21 ?CP Office Visit: 17 min 07/17/21 ?CP Office Visit Documentation

## 2021-07-18 ENCOUNTER — Telehealth: Payer: Medicare HMO

## 2021-07-18 ENCOUNTER — Ambulatory Visit: Payer: Medicare HMO

## 2021-08-04 ENCOUNTER — Other Ambulatory Visit: Payer: Self-pay | Admitting: Nurse Practitioner

## 2021-08-06 NOTE — Progress Notes (Signed)
?[place]  ?[phone] [phone] ? ?ID: Kara [name] OB: 1945/08/21  MR#: [id number]  UOH#:[id number] ? ?Patient Care Team: ?Jonetta Osgood, NP as PCP - General (Nurse Practitioner) ?Lloyd Huger, MD as Consulting Physician (Oncology) ?Herbert Pun, MD as Consulting Physician (General Surgery) ?Noreene Filbert, MD as Referring Physician (Radiation Oncology) ?Alena Bills, [facility] as Pharmacist (Pharmacist) ? ?I connected with Kara [name] on 08/09/21 at 10:45 AM EDT by video enabled telemedicine visit and verified that I am speaking with the correct person using two identifiers.  ? ?I discussed the limitations, risks, security and privacy concerns of performing an evaluation and management service by telemedicine and the availability of in-person appointments. I also discussed with the patient that there may be a patient responsible charge related to this service. The patient expressed understanding and agreed to proceed.  ? ?Other persons participating in the visit and their role in the encounter: Patient, MD. ? ?Patient?s location: Home. ?Provider?s location: Clinic. ? ? ?CHIEF COMPLAINT: [name]-grade DCIS of the right breast. ? ?INTERVAL HISTORY: Patient agreed to video assisted telemedicine visit for routine 92-monthevaluation.  She continues to be under treatment for her bullous pemphigoid, but symptoms have improved.  She otherwise feels well.  She is tolerating tamoxifen without significant side effects.  She has no neurologic complaints.  She denies any recent fevers or illnesses.  She has a good appetite and denies weight loss.  She has no chest pain, shortness of breath, cough, or hemoptysis.  She denies any nausea, vomiting, constipation, or diarrhea.  She has no urinary complaints.  Patient offers no further specific complaints today. ? ?REVIEW OF SYSTEMS:   ?Review of Systems  ?Constitutional: Negative.  Negative for fever, malaise/fatigue  and weight loss.  ?Respiratory: Negative.  Negative for cough, hemoptysis and shortness of breath.   ?Cardiovascular: Negative.  Negative for chest pain and leg swelling.  ?Gastrointestinal: Negative.  Negative for abdominal pain.  ?Genitourinary: Negative.  Negative for dysuria.  ?Musculoskeletal: Negative.  Negative for back pain and joint pain.  ?Skin:  Positive for itching and rash.  ?Neurological: Negative.  Negative for dizziness, focal weakness, weakness and headaches.  ?Psychiatric/Behavioral: Negative.  The patient is not nervous/anxious.   ? ?As per HPI. Otherwise, a complete review of systems is negative. ? ?PAST MEDICAL HISTORY: ?Past Medical History:  ?Diagnosis Date  ? Anemia   ? Arthropathy   ? Benign neoplasm of breast   ? Breast cancer [place]   ? Chest pain   ? Chronic kidney disease   ? 30% kidney function  ? [place]   ? [place]abetic [place]opathy [place]   ? Dyspnea   ? Edema   ? History of kidney stones   ? Hyperlipemia   ? Hypersomnia with sleep apnea   ? Hypertension   ? 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[place]   ? Lumbago   ? Osteoarthrosis   ? Ovarian failure   ? Personal history of radiation therapy   ? SOB (shortness of breath)   ? ? ?PAST SURGICAL HISTORY: ?Past Surgical History:  ?Procedure Laterality Date  ? BILATERAL SALPINGECTOMY    ? BREAST BIOPSY Right 2014  ? neg- core  ? BREAST BIOPSY Right 12/21/2018  ? [facility], venus clip,  DUCTAL CARCINOMA IN SITU  ? BREAST BIOPSY Right 12/21/2018  ? [facility]x, ribbon clip,  FIBROEPITHELIAL PROLIFERATION WITH SCLEROSIS  ? BREAST BIOPSY Right 01/01/2019  ? Affirm bx "X" clip-path pending  ? BREAST DUCTAL  SYSTEM EXCISION    ? BREAST EXCISIONAL BIOPSY Right 2014  ? neg  ? BREAST LUMPECTOMY Right 01/22/2019  ? path pending  ? CATARACT EXTRACTION    ? CHOLECYSTECTOMY    ? CYSTOSCOPY WITH STENT PLACEMENT Left 04/14/2015  ? Procedure: CYSTOSCOPY WITH STENT PLACEMENT;  Surgeon: Hollice Espy, MD;  Location: [facility];  Service: Urology;  Laterality: Left;  ?  EXTRACORPOREAL SHOCK WAVE LITHOTRIPSY Left 09/29/2014  ? Procedure: EXTRACORPOREAL SHOCK WAVE LITHOTRIPSY (ESWL);  Surgeon: Hollice Espy, MD;  Location: [facility] OR[facility];  Service: Urology;  Laterality: Left;  ? EYE SURGERY    ? bilateral cataract  ? IRRIGATION AND DEBRIDEMENT ABSCESS Right 03/24/2019  ? Procedure: IRRIGATION AND DEBRIDEMENT ABSCESS RIGHT BREAST;  Surgeon: Herbert Pun, MD;  Location: [facility];  Service: General;  Laterality: Right;  ? LAPAROSCOPIC OOPHERECTOMY Left   ? PARTIAL MASTECTOMY WITH NEEDLE LOCALIZATION Right 01/22/2019  ? Procedure: PARTIAL MASTECTOMY WITH NEEDLE LOCALIZATION;  Surgeon: Herbert Pun, MD;  Location: [facility];  Service: General;  Laterality: Right;  ? TONSILLECTOMY    ? TUBAL LIGATION    ? URETEROSCOPY WITH HOLMIUM LASER LITHOTRIPSY Left 04/14/2015  ? Procedure: URETEROSCOPY WITH HOLMIUM LASER LITHOTRIPSY;  Surgeon: Hollice Espy, MD;  Location: [facility];  Service: Urology;  Laterality: Left;  ? ? ?FAMILY HISTORY: ?Family History  ?Problem Relation Age of Onset  ? Breast cancer Maternal Grandmother 54  ? Colon cancer Mother   ? Lung cancer Father   ? ? ?ADVANCED DIRECTIVES (Y/N):  N ? ?HEALTH MAINTENANCE: ?Social History  ? ?Tobacco Use  ? Smoking status: Former  ?  Packs/day: 1.00  ?  Years: 30.00  ?  Pack years: 30.00  ?  Types: Cigarettes  ?  Quit date: 04/11/1995  ?  Years since quitting: 26.3  ? Smokeless tobacco: Never  ?Vaping Use  ? Vaping Use: Never used  ?Substance Use Topics  ? Alcohol use: No  ?  Alcohol/week: 0.0 standard drinks  ? Drug use: No  ? ? ? Colonoscopy: ? PAP: ? Bone density: ? Lipid panel: ? ?No Known Allergies ? ?Current Outpatient Medications  ?Medication Sig Dispense Refill  ? Aflibercept (EYLEA) 2 MG/0.05ML SOLN     ? aspirin EC 81 MG tablet Take 81 mg by mouth daily.     ? cholecalciferol (VITAMIN D) 1000 UNITS tablet Take 2,000 Units by mouth daily.     ? clobetasol ointment (TEMOVATE) 0.05 % Apply to affected areas twice daily. Avoid  applying to face, groin, and axilla. Use as directed. Risk of skin atrophy with long-term use reviewed. 60 g 2  ? doxycycline (MONODOX) 100 MG capsule Take 1 capsule (100 mg total) by mouth 2 (two) times daily. Take with food. 60 capsule 5  ? Dulaglutide (TRULICITY) 1.5 XN/2.3FT SOPN Inject 1.5 mg into the skin once a week. 6 mL 4  ? Ferrous Gluconate 324 (37.5 Fe) MG TABS Take 324 mg by mouth daily.    ? fluocinolone (SYNALAR) 0.01 % external solution Apply to affected areas ears and scalp 1-2 times a day as needed for itch. 60 mL 1  ? furosemide (LASIX) 20 MG tablet Take 20 mg by mouth 2 (two) times daily.    ? hydrOXYzine (ATARAX/VISTARIL) 10 MG tablet TAKE 1-3 TABLETS BY MOUTH EVERY NIGHT AS NEEDED FOR ITCH. 90 tablet 1  ? insulin detemir (LEVEMIR FLEXTOUCH) 100 UNIT/ML FlexPen INJECT UP TO 75 UNITS UNDER THE SKIN DAILY, titrate as needed. 30 mL 2  ? ketoconazole (  NIZORAL) 2 % shampoo Massage into scalp, let sit several minutes before rinsing. Use 2-3x/wk. 120 mL 3  ? Lactobacillus (PROBIOTIC ACIDOPHILUS) CAPS Take 1 capsule by mouth daily.    ? lisinopril (ZESTRIL) 10 MG tablet Take 10 mg by mouth daily.    ? methocarbamol (ROBAXIN) 500 MG tablet Take 1 tablet (500 mg total) by mouth every 8 (eight) hours as needed for muscle spasms. 60 tablet 0  ? Multiple Vitamin (MULTIVITAMIN) tablet Take 1 tablet by mouth daily at 12 noon.    ? mupirocin ointment (BACTROBAN) 2 % Apply to open wounds and cover 1-2 times daily until healed. 22 g 0  ? NOVOLOG FLEXPEN 100 UNIT/ML FlexPen INJECT [place] TWICE DAILY AS DIRECTED PER SLIDING SCALE. MAX DAILY DOSE IS 30 UNITS. DX E11.65. 15 mL 3  ? pantoprazole (PROTONIX) 40 MG tablet TAKE 1 TABLET BY MOUTH EVERY DAY 90 tablet 1  ? pregabalin (LYRICA) 100 MG capsule Take 1 capsule (100 mg total) by mouth 2 (two) times daily. 60 capsule 2  ? rosuvastatin (CRESTOR) 20 MG tablet Take 1 tablet (20 mg total) by mouth daily. 90 tablet 3  ? traMADol-acetaminophen (ULTRACET) 37.5-325 MG tablet  Take 1 tablet by mouth 2 (two) times daily as needed for moderate pain. 60 tablet 1  ? triamcinolone (KENALOG) 0.025 % cream Apply 1 application. topically 2 (two) times daily. 80 g 2  ? verapamil (CALA

## 2021-08-08 ENCOUNTER — Inpatient Hospital Stay: Payer: Medicare HMO | Attending: Oncology | Admitting: Oncology

## 2021-08-08 ENCOUNTER — Other Ambulatory Visit: Payer: Self-pay | Admitting: Emergency Medicine

## 2021-08-08 DIAGNOSIS — D0511 Intraductal carcinoma in situ of right breast: Secondary | ICD-10-CM

## 2021-08-08 MED ORDER — TAMOXIFEN CITRATE 20 MG PO TABS: 20.0000 mg | ORAL_TABLET | Freq: Every day | ORAL | 3 refills | Status: DC

## 2021-08-08 NOTE — Progress Notes (Signed)
Pt c/o sciatic pain 8/10. No relieving factors. No other concerns at this time. ?

## 2021-08-14 ENCOUNTER — Encounter: Payer: Self-pay | Admitting: Nurse Practitioner

## 2021-08-18 ENCOUNTER — Other Ambulatory Visit: Payer: Self-pay | Admitting: Nurse Practitioner

## 2021-08-18 DIAGNOSIS — E1142 Type 2 diabetes mellitus with diabetic polyneuropathy: Secondary | ICD-10-CM

## 2021-08-20 NOTE — Telephone Encounter (Signed)
Med sent to pharmacy.

## 2021-08-29 ENCOUNTER — Other Ambulatory Visit: Payer: Self-pay | Admitting: Nurse Practitioner

## 2021-08-29 DIAGNOSIS — E1122 Type 2 diabetes mellitus with diabetic chronic kidney disease: Secondary | ICD-10-CM

## 2021-09-03 ENCOUNTER — Ambulatory Visit: Payer: Medicare HMO | Admitting: Dermatology

## 2021-09-03 DIAGNOSIS — L820 Inflamed seborrheic keratosis: Secondary | ICD-10-CM

## 2021-09-03 DIAGNOSIS — L120 Bullous pemphigoid: Secondary | ICD-10-CM | POA: Diagnosis not present

## 2021-09-03 DIAGNOSIS — B078 Other viral warts: Secondary | ICD-10-CM

## 2021-09-03 DIAGNOSIS — D692 Other nonthrombocytopenic purpura: Secondary | ICD-10-CM

## 2021-09-03 MED ORDER — DOXYCYCLINE MONOHYDRATE 100 MG PO CAPS: 100.0000 mg | ORAL_CAPSULE | Freq: Two times a day (BID) | ORAL | 5 refills | Status: DC

## 2021-09-03 NOTE — Patient Instructions (Addendum)
Cryotherapy Aftercare ? ?Wash gently with soap and water everyday.   ?Apply Vaseline or Mupirocin ointment  and Band-Aid daily until healed.  ? ? ? ? ?If You Need Anything After Your Visit ? ?If you have any questions or concerns for your doctor, please call our main line at [phone] and press option 4 to reach your doctor's medical assistant. If no one answers, please leave a voicemail as directed and we will return your call as soon as possible. Messages left after 4 pm will be answered the following business day.  ? ?You may also send [place] a message via MyChart. We typically respond to MyChart messages within 1-2 business days. ? ?For prescription refills, please ask your pharmacy to contact our office. Our fax number is [phone]. ? ?If you have an urgent issue when the clinic is closed that cannot wait until the next business day, you can page your doctor at the number below.   ? ?Please note that while we do our best to be available for urgent issues outside of office hours, we are not available 24/7.  ? ?If you have an urgent issue and are unable to reach [place], you may choose to seek medical care at your doctor's office, retail clinic, urgent care center, or emergency room. ? ?If you have a medical emergency, please immediately call 911 or go to the emergency department. ? ?Pager Numbers ? ?- Dr. Nehemiah Massed: [phone] ? ?- Dr. Laurence Ferrari: [phone] ? ?- Dr. Nicole Kindred: [phone] ? ?In the event of inclement weather, please call our main line at [phone] for an update on the status of any delays or closures. ? ?Dermatology Medication Tips: ?Please keep the boxes that topical medications come in in order to help keep track of the instructions about where and how to use these. Pharmacies typically print the medication instructions only on the boxes and not directly on the medication tubes.  ? ?If your medication is too expensive, please contact our office at [phone] option 4 or send [place] a message  through [place].  ? ?We are unable to tell what your co-pay for medications will be in advance as this is different depending on your insurance coverage. However, we may be able to find a substitute medication at lower cost or fill out paperwork to get insurance to cover a needed medication.  ? ?If a prior authorization is required to get your medication covered by your insurance company, please allow [place] 1-2 business days to complete this process. ? ?Drug prices often vary depending on where the prescription is filled and some pharmacies may offer cheaper prices. ? ?The website www.goodrx.com contains coupons for medications through different pharmacies. The prices here do not account for what the cost may be with help from insurance (it may be cheaper with your insurance), but the website can give you the price if you did not use any insurance.  ?- You can print the associated coupon and take it with your prescription to the pharmacy.  ?- You may also stop by our office during regular business hours and pick up a GoodRx coupon card.  ?- If you need your prescription sent electronically to a different pharmacy, notify our office through [facility] or by phone at [phone] option 4. ? ? ? ? ?Si Usted Necesita Algo Despu?s de Su Visita ? ?Tambi?n puede enviarnos un mensaje a trav?s de MyChart. Por lo general respondemos a los mensajes de MyChart en el transcurso de 1 a 2 d?as h?biles. ? ?  Para renovar recetas, por favor pida a su farmacia que se ponga en contacto con nuestra oficina. Nuestro n?mero de fax es el [phone]. ? ?Si tiene un asunto urgente cuando la cl?nica est? cerrada y que no puede esperar hasta el siguiente d?a h?bil, puede llamar/localizar a su doctor(a) al n?mero que aparece a continuaci?n.  ? ?Por favor, tenga en cuenta que aunque hacemos todo lo posible para estar disponibles para asuntos urgentes fuera del horario de oficina, no estamos disponibles las 24 horas del d?a, los 7 d?as de  la semana.  ? ?Si tiene un problema urgente y no puede comunicarse con nosotros, puede optar por buscar atenci?n m?dica  en el consultorio de su doctor(a), en una cl?nica privada, en un centro de atenci?n urgente o en una sala de emergencias. ? ?Si tiene Engineer, maintenance (IT) m?dica, por favor llame inmediatamente al 911 o vaya a la sala de emergencias. ? ?N?meros de b?per ? ?- Dr. Nehemiah Massed: [phone] ? ?- Dra. Moye: [phone] ? ?- Dra. Nicole Kindred: [phone] ? ?En caso de inclemencias del tiempo, por favor llame a nuestra l?nea principal al [phone] para una actualizaci?n sobre el estado de cualquier retraso o cierre. ? ?Consejos para la medicaci?n en dermatolog?a: ?Por favor, guarde las cajas en las que vienen los medicamentos de uso t?pico para ayudarle a seguir las instrucciones sobre d?nde y c?mo usarlos. Las farmacias generalmente imprimen las instrucciones del medicamento s?lo en las cajas y no directamente en los tubos del [place].  ? ?Si su medicamento es muy caro, por favor, p?ngase en contacto con Zigmund Daniel llamando al [phone] y presione la opci?n 4 o env?enos un mensaje a trav?s de MyChart.  ? ?No podemos decirle cu?l ser? su copago por los medicamentos por adelantado ya que esto es diferente dependiendo de la cobertura de su seguro. Sin embargo, es posible que podamos encontrar un medicamento sustituto a Electrical engineer un formulario para que el seguro cubra el medicamento que se considera necesario.  ? ?Si se requiere Ardelia Mems autorizaci?n previa para que su compa??a de seguros [place] su medicamento, por favor perm?tanos de 1 a 2 d?as h?biles para completar este proceso. ? ?Los precios de los medicamentos var?an con frecuencia dependiendo del Environmental consultant de d?nde se surte la receta y alguna farmacias pueden ofrecer precios m?s baratos. ? ?El sitio web www.goodrx.com tiene cupones para medicamentos de Airline pilot. Los precios aqu? no tienen en cuenta lo que podr?a costar con la ayuda  del seguro (puede ser m?s barato con su seguro), pero el sitio web puede darle el precio si no utiliz? ning?n seguro.  ?- Puede imprimir el cup?n correspondiente y llevarlo con su receta a la farmacia.  ?- Tambi?n puede pasar por nuestra oficina durante el horario de atenci?n regular y recoger una tarjeta de cupones de GoodRx.  ?- Si necesita que su receta se env?e electr?nicamente a Chiropodist, informe a nuestra oficina a trav?s de MyChart de [place] o por tel?fono llamando al [phone] y presione la opci?n 4.  ?

## 2021-09-03 NOTE — Progress Notes (Signed)
? ?Follow-Up Visit ?  ?Subjective  ?Kara [name] is a 76 y.o. female who presents for the following: Follow-up (4 months f/u rash on legs, arms, ears treating with Doxycycline tablets, Mupirocin ointment and plain vaseline with a good response ). She has new dark spots on left lower leg.  Does not recall blisters that came up beforehand.  She just gets a few blisters here and there which respond well to topical treatment.  She also has an irritated growth on her elbow she would like removed. ? ?Husband with patient ? ?The following portions of the chart were reviewed this encounter and updated as appropriate:  ?  ?  ? ?Review of Systems:  No other skin or systemic complaints except as noted in HPI or Assessment and Plan. ? ?Objective  ?Well appearing patient in no apparent distress; mood and affect are within normal limits. ? ?A focused examination was performed including legs,arms,ears . Relevant physical exam findings are noted in the Assessment and Plan. ? ?legs, arms, ears ?Tense erythematous vesicles scattered at right ankle, left knee, resolving small pink patches on the right forehead, left lower back ? ?right upper elbow x 1 ?Verrucous papule -- Discussed viral etiology and contagion.  ? ?left lower pretibal x 3 ?waxy, gray-brown thin papules  ? ? ? ? ? ? ? ? ? ? ?Assessment & Plan  ?Bullous pemphigoid ?legs, arms, ears ? ?Chronic autoimmune condition- not clear, but controlled to pt satisfaction.  ?  ?Continue mupirocin ointment. Apply to open sores daily and keep covered.  ?  ?Continue Clobetasol spot treat to new, active lesions BID. Avoid applying to face, groin, and axilla. Use as directed. Risk of skin atrophy with long-term use reviewed.  ?  ?Continue triamcinolone 0.1% cream to itchy areas body qd/bid. Pt has at home.  ? ?Continue Doxycycline 100 mg PO BID dsp #60 5Rf. ? ?Doxycycline should be taken with food to prevent nausea. Do not lay down for 30 minutes after taking. Be cautious with sun  exposure and use good sun protection while on this medication. Pregnant women should not take this medication.   ? ?Related Medications ?mupirocin ointment (BACTROBAN) 2 % ?Apply to open wounds and cover 1-2 times daily until healed. ? ?clobetasol ointment (TEMOVATE) 0.05 % ?Apply to affected areas twice daily. Avoid applying to face, groin, and axilla. Use as directed. Risk of skin atrophy with long-term use reviewed. ? ?doxycycline (MONODOX) 100 MG capsule ?Take 1 capsule (100 mg total) by mouth 2 (two) times daily. Take with food. ? ?Other viral warts ?right upper elbow x 1 ? ?Discussed viral etiology and risk of spread.  Discussed multiple treatments may be required to clear warts.  Discussed possible post-treatment dyspigmentation and risk of recurrence.  ? ?Destruction of lesion - right upper elbow x 1 ? ?Destruction method: cryotherapy   ?Informed consent: discussed and consent obtained   ?Timeout:  patient name, date of birth, surgical site, and procedure verified ?Lesion destroyed using liquid nitrogen: Yes   ?Region frozen until ice ball extended beyond lesion: Yes   ?Outcome: patient tolerated procedure well with no complications   ?Post-procedure details: wound care instructions given   ?Additional details:  Prior to procedure, discussed risks of blister formation, small wound, skin dyspigmentation, or rare scar following cryotherapy. Recommend Vaseline ointment to treated areas while healing.  ? ?Inflamed seborrheic keratosis ?left lower pretibal x 3 ? ?Inflamed Sks vrs PIH from resolved BP ? ?May consider biopsy at next office visit  if no better  ? ?Destruction of lesion - left lower pretibal x 3 ? ?Destruction method: cryotherapy   ?Informed consent: discussed and consent obtained   ?Timeout:  patient name, date of birth, surgical site, and procedure verified ?Lesion destroyed using liquid nitrogen: Yes   ?Region frozen until ice ball extended beyond lesion: Yes   ?Outcome: patient tolerated procedure  well with no complications   ?Post-procedure details: wound care instructions given   ?Additional details:  Prior to procedure, discussed risks of blister formation, small wound, skin dyspigmentation, or rare scar following cryotherapy. Recommend Vaseline ointment to treated areas while healing.  ? ? ?Purpura - Chronic; persistent and recurrent.  Treatable, but not curable. ?Legs  ?- Violaceous macules and patches ?- Benign ?- Related to trauma, age, sun damage and/or use of blood thinners, chronic use of topical and/or oral steroids ?- Observe ?- Can use OTC arnica containing moisturizer such as Dermend Bruise Formula if desired ?- Call for worsening or other concerns  ? ? ?Return in about 2 months (around 11/03/2021) for wart, ISK, BP. ? ?I, Marye Round, CMA, am acting as scribe for [name], MD .  ? ?Documentation: I have reviewed the above documentation for accuracy and completeness, and I agree with the above. ? ?[name] MD  ?

## 2021-09-04 DIAGNOSIS — H34832 Tributary (branch) retinal vein occlusion, left eye, with macular edema: Secondary | ICD-10-CM | POA: Diagnosis not present

## 2021-09-21 ENCOUNTER — Telehealth: Payer: Self-pay

## 2021-09-21 NOTE — Telephone Encounter (Signed)
[facility] sent request for alternative to flex touch pen. Spoke to pt, she is using flex pen and said the flex touch pen is having production issues but she might be able to go back to using the flex touch pen in the future. She said she did not need any more flex pens at this time.

## 2021-10-11 ENCOUNTER — Ambulatory Visit (INDEPENDENT_AMBULATORY_CARE_PROVIDER_SITE_OTHER): Payer: Medicare HMO | Admitting: Nurse Practitioner

## 2021-10-11 ENCOUNTER — Other Ambulatory Visit: Payer: Self-pay | Admitting: Nurse Practitioner

## 2021-10-11 ENCOUNTER — Encounter: Payer: Self-pay | Admitting: Nurse Practitioner

## 2021-10-11 VITALS — BP 117/73 | HR 60 | Temp 97.7°F | Resp 16 | Ht 64.0 in | Wt 193.0 lb

## 2021-10-11 DIAGNOSIS — R300 Dysuria: Secondary | ICD-10-CM | POA: Diagnosis not present

## 2021-10-11 DIAGNOSIS — Z23 Encounter for immunization: Secondary | ICD-10-CM | POA: Diagnosis not present

## 2021-10-11 DIAGNOSIS — I10 Essential (primary) hypertension: Secondary | ICD-10-CM

## 2021-10-11 DIAGNOSIS — E1122 Type 2 diabetes mellitus with diabetic chronic kidney disease: Secondary | ICD-10-CM | POA: Diagnosis not present

## 2021-10-11 DIAGNOSIS — M064 Inflammatory polyarthropathy: Secondary | ICD-10-CM

## 2021-10-11 DIAGNOSIS — Z794 Long term (current) use of insulin: Secondary | ICD-10-CM

## 2021-10-11 DIAGNOSIS — N184 Chronic kidney disease, stage 4 (severe): Secondary | ICD-10-CM

## 2021-10-11 DIAGNOSIS — E11621 Type 2 diabetes mellitus with foot ulcer: Secondary | ICD-10-CM

## 2021-10-11 DIAGNOSIS — E782 Mixed hyperlipidemia: Secondary | ICD-10-CM

## 2021-10-11 DIAGNOSIS — Z0001 Encounter for general adult medical examination with abnormal findings: Secondary | ICD-10-CM | POA: Diagnosis not present

## 2021-10-11 DIAGNOSIS — E1142 Type 2 diabetes mellitus with diabetic polyneuropathy: Secondary | ICD-10-CM

## 2021-10-11 DIAGNOSIS — L120 Bullous pemphigoid: Secondary | ICD-10-CM

## 2021-10-11 DIAGNOSIS — M159 Polyosteoarthritis, unspecified: Secondary | ICD-10-CM

## 2021-10-11 DIAGNOSIS — L97511 Non-pressure chronic ulcer of other part of right foot limited to breakdown of skin: Secondary | ICD-10-CM | POA: Diagnosis not present

## 2021-10-11 DIAGNOSIS — I700 Atherosclerosis of aorta: Secondary | ICD-10-CM

## 2021-10-11 DIAGNOSIS — K219 Gastro-esophageal reflux disease without esophagitis: Secondary | ICD-10-CM

## 2021-10-11 DIAGNOSIS — Z760 Encounter for issue of repeat prescription: Secondary | ICD-10-CM | POA: Diagnosis not present

## 2021-10-11 LAB — POCT GLYCOSYLATED HEMOGLOBIN (HGB A1C): Hemoglobin A1C: 6.5 % — AB (ref 4.0–5.6)

## 2021-10-11 MED ORDER — PNEUMOCOCCAL 20-VAL CONJ VACC 0.5 ML IM SUSY: 0.5000 mL | PREFILLED_SYRINGE | INTRAMUSCULAR | 0 refills | Status: AC

## 2021-10-11 MED ORDER — LEVEMIR FLEXTOUCH 100 UNIT/ML ~~LOC~~ SOPN: PEN_INJECTOR | SUBCUTANEOUS | 2 refills | Status: DC

## 2021-10-11 MED ORDER — ZOSTER VAC RECOMB ADJUVANTED 50 MCG/0.5ML IM SUSR: 0.5000 mL | Freq: Once | INTRAMUSCULAR | 0 refills | Status: AC

## 2021-10-11 MED ORDER — VERAPAMIL HCL 40 MG PO TABS: 40.0000 mg | ORAL_TABLET | Freq: Two times a day (BID) | ORAL | 1 refills | Status: DC

## 2021-10-11 MED ORDER — PANTOPRAZOLE SODIUM 40 MG PO TBEC: DELAYED_RELEASE_TABLET | ORAL | 1 refills | Status: DC

## 2021-10-11 NOTE — Progress Notes (Signed)
[facility] [place] [address], [place] [address]  Internal MEDICINE  Office Visit Note  Patient Name: Kara [name]  [id number]  [id number]  Date of Service: 10/11/2021  Chief Complaint  Patient presents with  . Medicare Wellness  . Diabetes  . Hypertension  . Hyperlipidemia  . Back Pain    Feels like it could be pain from sciatica - pain shoots down legs, pain level is a 10 at times  . Quality Metric Gaps    Shingles Vaccine, Pneumonia Vaccine    HPI Kara [name] presents for an annual well visit and physical exam.  --Well-appearing 76 year old female with hypertension, aortic atherosclerosis, GERD, type 2 diabetes, CKD stage IV, hyperparathyroidism and inflammatory polyarthropathy --Her mammogram is scheduled for October this year, her other preventive screenings are up-to-date with a routine colonoscopy due in December 2024. --- She is due for routine labs -- lives at home with husband, she is retired now, Her children are grown.  --Her A1c is 6.5 today which is slightly improved from 6.6 in March.  She has lost 6 pounds since her previous office visit.  She reports that she is on the [employer] and it is going well. --Patient is interested in trying CBD Gummies for pain.  After reviewing her medications and discussing this with the patient, this should not cause any increased risk for interaction with any of her medications.  She plans to take it at night before bed and knows not to drive with it     Current Medication: Outpatient Encounter Medications as of 10/11/2021  Medication Sig  . Aflibercept (EYLEA) 2 MG/0.05ML SOLN   . aspirin EC 81 MG tablet Take 81 mg by mouth daily.   . cholecalciferol (VITAMIN D) 1000 UNITS tablet Take 2,000 Units by mouth daily.   . clobetasol ointment (TEMOVATE) 0.05 % Apply to affected areas twice daily. Avoid applying to face, groin, and axilla. Use as directed. Risk of skin atrophy with long-term use reviewed.  . doxycycline  (MONODOX) 100 MG capsule Take 1 capsule (100 mg total) by mouth 2 (two) times daily. Take with food.  . Dulaglutide (TRULICITY) 1.5 SE/8.3TD SOPN Inject 1.5 mg into the skin once a week.  . Ferrous Gluconate 324 (37.5 Fe) MG TABS Take 324 mg by mouth daily.  . fluocinolone (SYNALAR) 0.01 % external solution Apply to affected areas ears and scalp 1-2 times a day as needed for itch.  . furosemide (LASIX) 20 MG tablet Take 20 mg by mouth 2 (two) times daily.  . hydrOXYzine (ATARAX/VISTARIL) 10 MG tablet TAKE 1-3 TABLETS BY MOUTH EVERY NIGHT AS NEEDED FOR ITCH.  Marland Kitchen ketoconazole (NIZORAL) 2 % shampoo Massage into scalp, let sit several minutes before rinsing. Use 2-3x/wk.  . Lactobacillus (PROBIOTIC ACIDOPHILUS) CAPS Take 1 capsule by mouth daily.  Marland Kitchen lisinopril (ZESTRIL) 10 MG tablet Take 10 mg by mouth daily.  . methocarbamol (ROBAXIN) 500 MG tablet Take 1 tablet (500 mg total) by mouth every 8 (eight) hours as needed for muscle spasms.  . Multiple Vitamin (MULTIVITAMIN) tablet Take 1 tablet by mouth daily at 12 noon.  . mupirocin ointment (BACTROBAN) 2 % Apply to open wounds and cover 1-2 times daily until healed.  Marland Kitchen NOVOLOG FLEXPEN 100 UNIT/ML FlexPen INJECT  TWICE DAILY AS DIRECTED PER SLIDING SCALE. MAX DAILY DOSE IS 30 UNITS. DX E11.65.  Marland Kitchen Omega-3 Fatty Acids (FISH OIL OMEGA-3 PO) Take 25 mg by mouth daily.  . pneumococcal 20-valent conjugate vaccine (PREVNAR 20) 0.5  ML injection Inject 0.5 mLs into the muscle tomorrow at 10 am for 1 dose.  . pregabalin (LYRICA) 100 MG capsule TAKE 1 CAPSULE BY MOUTH TWICE A DAY  . rosuvastatin (CRESTOR) 20 MG tablet Take 1 tablet (20 mg total) by mouth daily.  . tamoxifen (NOLVADEX) 20 MG tablet Take 1 tablet (20 mg total) by mouth daily.  . traMADol-acetaminophen (ULTRACET) 37.5-325 MG tablet Take 1 tablet by mouth 2 (two) times daily as needed for moderate pain.  Marland Kitchen triamcinolone (KENALOG) 0.025 % cream Apply 1 application. topically 2 (two) times daily.  Marland Kitchen  Zoster Vaccine Adjuvanted [facility]) injection Inject 0.5 mLs into the muscle once for 1 dose.  . [DISCONTINUED] LEVEMIR FLEXTOUCH 100 UNIT/ML FlexTouch Pen INJECT UP TO 75 UNITS UNDER THE SKIN DAILY, TITRATE AS NEEDED.  . [DISCONTINUED] pantoprazole (PROTONIX) 40 MG tablet TAKE 1 TABLET BY MOUTH EVERY DAY  . [DISCONTINUED] verapamil (CALAN) 40 MG tablet TAKE 1 TABLET BY MOUTH TWICE A DAY  . insulin detemir (LEVEMIR FLEXTOUCH) 100 UNIT/ML FlexPen INJECT UP TO 75 UNITS UNDER THE SKIN DAILY, TITRATE AS NEEDED.  Marland Kitchen pantoprazole (PROTONIX) 40 MG tablet TAKE 1 TABLET BY MOUTH EVERY DAY  . verapamil (CALAN) 40 MG tablet Take 1 tablet (40 mg total) by mouth 2 (two) times daily.   No facility-administered encounter medications on file as of 10/11/2021.    Surgical History: Past Surgical History:  Procedure Laterality Date  . BILATERAL SALPINGECTOMY    . BREAST BIOPSY Right 2014   neg- core  . BREAST BIOPSY Right 12/21/2018   [place] bx, venus clip,  DUCTAL CARCINOMA IN SITU  . BREAST BIOPSY Right 12/21/2018   [place] bx, ribbon clip,  FIBROEPITHELIAL PROLIFERATION WITH SCLEROSIS  . BREAST BIOPSY Right 01/01/2019   Affirm bx "X" clip-path pending  . BREAST DUCTAL SYSTEM EXCISION    . BREAST EXCISIONAL BIOPSY Right 2014   neg  . BREAST LUMPECTOMY Right 01/22/2019   path pending  . CATARACT EXTRACTION    . CHOLECYSTECTOMY    . CYSTOSCOPY WITH STENT PLACEMENT Left 04/14/2015   Procedure: CYSTOSCOPY WITH STENT PLACEMENT;  Surgeon: Hollice Espy, MD;  Location: [facility] ORS;  Service: Urology;  Laterality: Left;  . EXTRACORPOREAL SHOCK WAVE LITHOTRIPSY Left 09/29/2014   Procedure: EXTRACORPOREAL SHOCK WAVE LITHOTRIPSY (ESWL);  Surgeon: Hollice Espy, MD;  Location: [facility] ORS;  Service: Urology;  Laterality: Left;  . EYE SURGERY     bilateral cataract  . IRRIGATION AND DEBRIDEMENT ABSCESS Right 03/24/2019   Procedure: IRRIGATION AND DEBRIDEMENT ABSCESS RIGHT BREAST;  Surgeon: Herbert Pun, MD;   Location: [facility] ORS;  Service: General;  Laterality: Right;  . LAPAROSCOPIC OOPHERECTOMY Left   . PARTIAL MASTECTOMY WITH NEEDLE LOCALIZATION Right 01/22/2019   Procedure: PARTIAL MASTECTOMY WITH NEEDLE LOCALIZATION;  Surgeon: Herbert Pun, MD;  Location: [facility] ORS;  Service: General;  Laterality: Right;  . TONSILLECTOMY    . TUBAL LIGATION    . URETEROSCOPY WITH HOLMIUM LASER LITHOTRIPSY Left 04/14/2015   Procedure: URETEROSCOPY WITH HOLMIUM LASER LITHOTRIPSY;  Surgeon: Hollice Espy, MD;  Location: [facility] OR[facility];  Service: Urology;  Laterality: Left;    Medical History: Past Medical History:  Diagnosis Date  . Anemia   . Arthropathy   . Benign neoplasm of breast   . Breast cancer ([place])   . Chest pain   . Chronic kidney disease    30% kidney function  . Diabetes ([place])   . Diabetic retinopathy ([place])   . Dyspnea   . Edema   .  History of kidney stones   . Hyperlipemia   . Hypersomnia with sleep apnea   . Hypertension   . 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[place])   . Lumbago   . Osteoarthrosis   . Ovarian failure   . Personal history of radiation therapy   . SOB (shortness of breath)     Family History: Family History  Problem Relation Age of Onset  . Breast cancer Maternal Grandmother 35  . Colon cancer Mother   . Lung cancer Father     Social History   Socioeconomic History  . Marital status: Married    Spouse name: Not on file  . Number of children: Not on file  . Years of education: Not on file  . Highest education level: Not on file  Occupational History  . Not on file  Tobacco Use  . Smoking status: Former    Packs/day: 1.00    Years: 30.00    Total pack years: 30.00    Types: Cigarettes    Quit date: 04/11/1995    Years since quitting: 26.5  . Smokeless tobacco: Never  Vaping Use  . Vaping Use: Never used  Substance and Sexual Activity  . Alcohol use: No    Alcohol/week: 0.0 standard drinks of alcohol  . Drug use: No  . Sexual activity: Not on  file  Other Topics Concern  . Not on file  Social History Narrative  . Not on file   Social Determinants of Health   Financial Resource Strain: Low Risk  (10/03/2020)   Overall Financial Resource Strain (CARDIA)   . Difficulty of Paying Living Expenses: Not very hard  Food Insecurity: Not on file  Transportation Needs: Not on file  Physical Activity: Not on file  Stress: Not on file  Social Connections: Not on file  Intimate Partner Violence: Not on file      Review of Systems  Vital Signs: BP 117/73   Pulse (!) 52   Temp 97.7 F (36.5 C)   Resp 16   Ht _0  (1.626 m)   Wt 193 lb (87.5 kg)   SpO2 96%   BMI 33.13 kg/m    Physical Exam     Assessment/Plan: 1. Type 2 diabetes mellitus with stage 4 chronic kidney disease, with long-term current use of insulin (HCC) - POCT HgB A1C - CBC with Differential/Platelet - CMP14+EGFR - Lipid Profile - Vitamin D (25 hydroxy) - B12 and Folate Panel - Iron, TIBC and Ferritin Panel - insulin detemir (LEVEMIR FLEXTOUCH) 100 UNIT/ML FlexPen; INJECT UP TO 75 UNITS UNDER THE SKIN DAILY, TITRATE AS NEEDED.  Dispense: 30 mL; Refill: 2 - Ambulatory referral to Podiatry  2. Dysuria - UA/M w/rflx Culture, Routine - CBC with Differential/Platelet - CMP14+EGFR - Lipid Profile - Vitamin D (25 hydroxy) - B12 and Folate Panel - Iron, TIBC and Ferritin Panel  3. Mixed hyperlipidemia - CBC with Differential/Platelet - CMP14+EGFR - Lipid Profile - Vitamin D (25 hydroxy) - B12 and Folate Panel - Iron, TIBC and Ferritin Panel  4. Essential hypertension - CBC with Differential/Platelet - CMP14+EGFR - Lipid Profile - Vitamin D (25 hydroxy) - B12 and Folate Panel - Iron, TIBC and Ferritin Panel - verapamil (CALAN) 40 MG tablet; Take 1 tablet (40 mg total) by mouth 2 (two) times daily.  Dispense: 180 tablet; Refill: 1  5. Diabetic polyneuropathy associated with type 2 diabetes mellitus (HCC) - CBC with Differential/Platelet -  CMP14+EGFR - Lipid Profile - Vitamin D (25 hydroxy) -  B12 and Folate Panel - Iron, TIBC and Ferritin Panel  6. Generalized osteoarthritis of multiple sites - CBC with Differential/Platelet - CMP14+EGFR - Lipid Profile - Vitamin D (25 hydroxy) - B12 and Folate Panel - Iron, TIBC and Ferritin Panel  7. Atherosclerosis of aorta (HCC) - CBC with Differential/Platelet - CMP14+EGFR - Lipid Profile - Vitamin D (25 hydroxy) - B12 and Folate Panel - Iron, TIBC and Ferritin Panel  8. Encounter for routine adult health examination with abnormal findings - CBC with Differential/Platelet - CMP14+EGFR - Lipid Profile - Vitamin D (25 hydroxy) - B12 and Folate Panel - Iron, TIBC and Ferritin Panel  9. Diabetic ulcer of toe of right foot associated with type 2 diabetes mellitus, limited to breakdown of skin (HCC) - CBC with Differential/Platelet - CMP14+EGFR - Lipid Profile - Vitamin D (25 hydroxy) - B12 and Folate Panel - Iron, TIBC and Ferritin Panel - Ambulatory referral to Podiatry  10. Inflammatory polyarthropathy (HCC) - CBC with Differential/Platelet - CMP14+EGFR - Lipid Profile - Vitamin D (25 hydroxy) - B12 and Folate Panel - Iron, TIBC and Ferritin Panel  11. Bullous pemphigoid - CBC with Differential/Platelet - CMP14+EGFR - Lipid Profile - Vitamin D (25 hydroxy) - B12 and Folate Panel - Iron, TIBC and Ferritin Panel  12. CKD (chronic kidney disease), stage IV (HCC) - CBC with Differential/Platelet - CMP14+EGFR - Lipid Profile - Vitamin D (25 hydroxy) - B12 and Folate Panel - Iron, TIBC and Ferritin Panel  13. Gastroesophageal reflux disease without esophagitis - CBC with Differential/Platelet - CMP14+EGFR - Lipid Profile - Vitamin D (25 hydroxy) - B12 and Folate Panel - Iron, TIBC and Ferritin Panel - pantoprazole (PROTONIX) 40 MG tablet; TAKE 1 TABLET BY MOUTH EVERY DAY  Dispense: 90 tablet; Refill: 1  14. Aortic atherosclerosis (HCC) - CBC with  Differential/Platelet - CMP14+EGFR - Lipid Profile - Vitamin D (25 hydroxy) - B12 and Folate Panel - Iron, TIBC and Ferritin Panel  15. Need for vaccination - Zoster Vaccine Adjuvanted [facility]) injection; Inject 0.5 mLs into the muscle once for 1 dose.  Dispense: 0.5 mL; Refill: 0 - pneumococcal 20-valent conjugate vaccine (PREVNAR 20) 0.5 ML injection; Inject 0.5 mLs into the muscle tomorrow at 10 am for 1 dose.  Dispense: 0.5 mL; Refill: 0 - CBC with Differential/Platelet - CMP14+EGFR - Lipid Profile - Vitamin D (25 hydroxy) - B12 and Folate Panel - Iron, TIBC and Ferritin Panel     General Counseling: Kara [name] verbalizes understanding of the findings of todays visit and agrees with plan of treatment. I have discussed any further diagnostic evaluation that may be needed or ordered today. We also reviewed her medications today. she has been encouraged to call the office with any questions or concerns that should arise related to todays visit.    Orders Placed This Encounter  Procedures  . UA/M w/rflx Culture, Routine  . CBC with Differential/Platelet  . CMP14+EGFR  . Lipid Profile  . Vitamin D (25 hydroxy)  . B12 and Folate Panel  . Iron, TIBC and Ferritin Panel  . Ambulatory referral to Podiatry  . POCT HgB A1C    Meds ordered this encounter  Medications  . Zoster Vaccine Adjuvanted [facility]) injection    Sig: Inject 0.5 mLs into the muscle once for 1 dose.    Dispense:  0.5 mL    Refill:  0  . pneumococcal 20-valent conjugate vaccine (PREVNAR 20) 0.5 ML injection    Sig: Inject 0.5 mLs into the muscle tomorrow at  10 am for 1 dose.    Dispense:  0.5 mL    Refill:  0  . insulin detemir (LEVEMIR FLEXTOUCH) 100 UNIT/ML FlexPen    Sig: INJECT UP TO 75 UNITS UNDER THE SKIN DAILY, TITRATE AS NEEDED.    Dispense:  30 mL    Refill:  2  . pantoprazole (PROTONIX) 40 MG tablet    Sig: TAKE 1 TABLET BY MOUTH EVERY DAY    Dispense:  90 tablet    Refill:  1  . verapamil  (CALAN) 40 MG tablet    Sig: Take 1 tablet (40 mg total) by mouth 2 (two) times daily.    Dispense:  180 tablet    Refill:  1    Return in about 3 months (around 01/11/2022) for F/U, Recheck A1C, Zacharia Sowles PCP.   Total time spent:*** Minutes Time spent includes review of chart, medications, test results, and follow up plan with the patient.   [place] Controlled Substance Database was reviewed by me.  This patient was seen by Jonetta Osgood, FNP-C in collaboration with Dr. Clayborn Bigness as a part of collaborative care agreement.  Timya Trimmer R. Valetta Fuller, MSN, FNP-C Internal medicine

## 2021-10-12 ENCOUNTER — Telehealth: Payer: Self-pay

## 2021-10-12 LAB — UA/M W/RFLX CULTURE, ROUTINE
Bilirubin, UA: NEGATIVE
Glucose, UA: NEGATIVE
Ketones, UA: NEGATIVE
Leukocytes,UA: NEGATIVE
Nitrite, UA: NEGATIVE
Protein,UA: NEGATIVE
RBC, UA: NEGATIVE
Specific Gravity, UA: 1.01 (ref 1.005–1.030)
Urobilinogen, Ur: 0.2 mg/dL (ref 0.2–1.0)
pH, UA: 5 (ref 5.0–7.5)

## 2021-10-12 LAB — MICROSCOPIC EXAMINATION
Bacteria, UA: NONE SEEN
Casts: NONE SEEN /lpf
WBC, UA: NONE SEEN /hpf (ref 0–5)

## 2021-10-15 ENCOUNTER — Other Ambulatory Visit: Payer: Self-pay

## 2021-10-15 DIAGNOSIS — N184 Chronic kidney disease, stage 4 (severe): Secondary | ICD-10-CM

## 2021-10-15 MED ORDER — LEVEMIR FLEXPEN 100 UNIT/ML ~~LOC~~ SOPN: PEN_INJECTOR | SUBCUTANEOUS | 2 refills | Status: DC

## 2021-11-06 DIAGNOSIS — Z794 Long term (current) use of insulin: Secondary | ICD-10-CM | POA: Diagnosis not present

## 2021-11-06 DIAGNOSIS — E782 Mixed hyperlipidemia: Secondary | ICD-10-CM | POA: Diagnosis not present

## 2021-11-06 DIAGNOSIS — Z0001 Encounter for general adult medical examination with abnormal findings: Secondary | ICD-10-CM | POA: Diagnosis not present

## 2021-11-06 DIAGNOSIS — M159 Polyosteoarthritis, unspecified: Secondary | ICD-10-CM | POA: Diagnosis not present

## 2021-11-06 DIAGNOSIS — R300 Dysuria: Secondary | ICD-10-CM | POA: Diagnosis not present

## 2021-11-06 DIAGNOSIS — N184 Chronic kidney disease, stage 4 (severe): Secondary | ICD-10-CM | POA: Diagnosis not present

## 2021-11-06 DIAGNOSIS — E1142 Type 2 diabetes mellitus with diabetic polyneuropathy: Secondary | ICD-10-CM | POA: Diagnosis not present

## 2021-11-06 DIAGNOSIS — E1122 Type 2 diabetes mellitus with diabetic chronic kidney disease: Secondary | ICD-10-CM | POA: Diagnosis not present

## 2021-11-06 DIAGNOSIS — I10 Essential (primary) hypertension: Secondary | ICD-10-CM | POA: Diagnosis not present

## 2021-11-07 LAB — CBC WITH DIFFERENTIAL/PLATELET
Basophils Absolute: 0 10*3/uL (ref 0.0–0.2)
Basos: 1 %
EOS (ABSOLUTE): 0.3 10*3/uL (ref 0.0–0.4)
Eos: 4 %
Hematocrit: 33.8 % — ABNORMAL LOW (ref 34.0–46.6)
Hemoglobin: 10.6 g/dL — ABNORMAL LOW (ref 11.1–15.9)
Immature Grans (Abs): 0 10*3/uL (ref 0.0–0.1)
Immature Granulocytes: 0 %
Lymphocytes Absolute: 2 10*3/uL (ref 0.7–3.1)
Lymphs: 32 %
MCH: 27.5 pg (ref 26.6–33.0)
MCHC: 31.4 g/dL — ABNORMAL LOW (ref 31.5–35.7)
MCV: 88 fL (ref 79–97)
Monocytes Absolute: 0.7 10*3/uL (ref 0.1–0.9)
Monocytes: 12 %
Neutrophils Absolute: 3 10*3/uL (ref 1.4–7.0)
Neutrophils: 51 %
Platelets: 152 10*3/uL (ref 150–450)
RBC: 3.85 x10E6/uL (ref 3.77–5.28)
RDW: 15.3 % (ref 11.7–15.4)
WBC: 6 10*3/uL (ref 3.4–10.8)

## 2021-11-07 LAB — CMP14+EGFR
ALT: 15 IU/L (ref 0–32)
AST: 22 IU/L (ref 0–40)
Albumin/Globulin Ratio: 1.3 (ref 1.2–2.2)
Albumin: 3.7 g/dL — ABNORMAL LOW (ref 3.8–4.8)
Alkaline Phosphatase: 88 IU/L (ref 44–121)
BUN/Creatinine Ratio: 20 (ref 12–28)
BUN: 32 mg/dL — ABNORMAL HIGH (ref 8–27)
Bilirubin Total: <0.2 mg/dL (ref 0.0–1.2)
CO2: 25 mmol/L (ref 20–29)
Calcium: 9.3 mg/dL (ref 8.7–10.3)
Chloride: 103 mmol/L (ref 96–106)
Creatinine, Ser: 1.61 mg/dL — ABNORMAL HIGH (ref 0.57–1.00)
Globulin, Total: 2.9 g/dL (ref 1.5–4.5)
Glucose: 86 mg/dL (ref 70–99)
Potassium: 4.4 mmol/L (ref 3.5–5.2)
Sodium: 141 mmol/L (ref 134–144)
Total Protein: 6.6 g/dL (ref 6.0–8.5)
eGFR: 33 mL/min/{1.73_m2} — ABNORMAL LOW (ref >59)

## 2021-11-07 LAB — LIPID PANEL
Chol/HDL Ratio: 3.1 ratio (ref 0.0–4.4)
Cholesterol, Total: 135 mg/dL (ref 100–199)
HDL: 44 mg/dL (ref >39)
LDL Chol Calc (NIH): 56 mg/dL (ref 0–99)
Triglycerides: 214 mg/dL — ABNORMAL HIGH (ref 0–149)
VLDL Cholesterol Cal: 35 mg/dL (ref 5–40)

## 2021-11-07 LAB — B12 AND FOLATE PANEL
Folate: >20 ng/mL (ref >3.0)
Vitamin B-12: 739 pg/mL (ref 232–1245)

## 2021-11-07 LAB — IRON,TIBC AND FERRITIN PANEL
Ferritin: 150 ng/mL (ref 15–150)
Iron Saturation: 18 % (ref 15–55)
Iron: 45 ug/dL (ref 27–139)
Total Iron Binding Capacity: 257 ug/dL (ref 250–450)
UIBC: 212 ug/dL (ref 118–369)

## 2021-11-07 LAB — VITAMIN D 25 HYDROXY (VIT D DEFICIENCY, FRACTURES): Vit D, 25-Hydroxy: 41.7 ng/mL (ref 30.0–100.0)

## 2021-11-18 ENCOUNTER — Other Ambulatory Visit: Payer: Self-pay | Admitting: Nurse Practitioner

## 2021-11-18 DIAGNOSIS — E1142 Type 2 diabetes mellitus with diabetic polyneuropathy: Secondary | ICD-10-CM

## 2021-11-19 ENCOUNTER — Ambulatory Visit: Payer: Medicare HMO | Admitting: Dermatology

## 2021-11-19 DIAGNOSIS — L120 Bullous pemphigoid: Secondary | ICD-10-CM | POA: Diagnosis not present

## 2021-11-19 DIAGNOSIS — L810 Postinflammatory hyperpigmentation: Secondary | ICD-10-CM

## 2021-11-19 MED ORDER — TACROLIMUS 0.1 % EX OINT: TOPICAL_OINTMENT | Freq: Two times a day (BID) | CUTANEOUS | 2 refills | Status: DC

## 2021-11-19 NOTE — Telephone Encounter (Signed)
Med sent.

## 2021-11-19 NOTE — Patient Instructions (Addendum)
Start clobetasol ointment to affected areas at hairline twice daily until clear.Avoid applying to face, groin, and axilla. Use as directed. Long-term use can cause thinning of the skin. Start mupirocin 1-2 times daily to open areas.  Start tacrolimus twice daily to affected areas at thighs.  Topical steroids (such as triamcinolone, fluocinolone, fluocinonide, mometasone, clobetasol, halobetasol, betamethasone, hydrocortisone) can cause thinning and lightening of the skin if they are used for too long in the same area. Your physician has selected the right strength medicine for your problem and area affected on the body. Please use your medication only as directed by your physician to prevent side effects.   Due to recent changes in healthcare laws, you may see results of your pathology and/or laboratory studies on MyChart before the doctors have had a chance to review them. We understand that in some cases there may be results that are confusing or concerning to you. Please understand that not all results are received at the same time and often the doctors may need to interpret multiple results in order to provide you with the best plan of care or course of treatment. Therefore, we ask that you please give [place] 2 business days to thoroughly review all your results before contacting the office for clarification. Should we see a critical lab result, you will be contacted sooner.   If You Need Anything After Your Visit  If you have any questions or concerns for your doctor, please call our main line at [phone] and press option 4 to reach your doctor's medical assistant. If no one answers, please leave a voicemail as directed and we will return your call as soon as possible. Messages left after 4 pm will be answered the following business day.   You may also send [place] a message via [place]. We typically respond to MyChart messages within 1-2 business days.  For prescription refills, please ask your  pharmacy to contact our office. Our fax number is [phone].  If you have an urgent issue when the clinic is closed that cannot wait until the next business day, you can page your doctor at the number below.    Please note that while we do our best to be available for urgent issues outside of office hours, we are not available 24/7.   If you have an urgent issue and are unable to reach [place], you may choose to seek medical care at your doctor's office, retail clinic, urgent care center, or emergency room.  If you have a medical emergency, please immediately call 911 or go to the emergency department.  Pager Numbers  - Dr. Nehemiah Massed: [phone]  - Dr. Laurence Ferrari: [phone]  - Dr. Nicole Kindred: [phone]  In the event of inclement weather, please call our main line at [phone] for an update on the status of any delays or closures.  Dermatology Medication Tips: Please keep the boxes that topical medications come in in order to help keep track of the instructions about where and how to use these. Pharmacies typically print the medication instructions only on the boxes and not directly on the medication tubes.   If your medication is too expensive, please contact our office at [phone] option 4 or send [place] a message through [place].   We are unable to tell what your co-pay for medications will be in advance as this is different depending on your insurance coverage. However, we may be able to find a substitute medication at lower cost or fill out paperwork to get insurance  to cover a needed medication.   If a prior authorization is required to get your medication covered by your insurance company, please allow [place] 1-2 business days to complete this process.  Drug prices often vary depending on where the prescription is filled and some pharmacies may offer cheaper prices.  The website www.goodrx.com contains coupons for medications through different pharmacies. The prices here do not  account for what the cost may be with help from insurance (it may be cheaper with your insurance), but the website can give you the price if you did not use any insurance.  - You can print the associated coupon and take it with your prescription to the pharmacy.  - You may also stop by our office during regular business hours and pick up a GoodRx coupon card.  - If you need your prescription sent electronically to a different pharmacy, notify our office through [facility] or by phone at [phone] option 4.     Si Usted Necesita Algo Despus de Su Visita  Tambin puede enviarnos un mensaje a travs de Pharmacist, community. Por lo general respondemos a los mensajes de MyChart en el transcurso de 1 a 2 das hbiles.  Para renovar recetas, por favor pida a su farmacia que se ponga en contacto con nuestra oficina. Harland Dingwall de fax es [place] [phone].  Si tiene un asunto urgente cuando la clnica est cerrada y que no puede esperar hasta el siguiente da hbil, puede llamar/localizar a su doctor(a) al nmero que aparece a continuacin.   Por favor, tenga en cuenta que aunque hacemos todo lo posible para estar disponibles para asuntos urgentes fuera del horario de [place], no estamos disponibles las 24 horas del da, los 7 das de la [place].   Si tiene un problema urgente y no puede comunicarse con nosotros, puede optar por buscar atencin mdica  en el consultorio de su doctor(a), en una clnica privada, en un centro de atencin urgente o en una sala de emergencias.  Si tiene Engineering geologist, por favor llame inmediatamente al 911 o vaya a la sala de emergencias.  Nmeros de bper  - Dr. Nehemiah Massed: [phone]  - Dra. Moye: [phone]  - Dra. Nicole Kindred: [phone]  En caso de inclemencias del [place], por favor llame a Johnsie Kindred principal al [phone] para una actualizacin sobre el [place] de cualquier retraso o cierre.  Consejos para la medicacin en dermatologa: Por  favor, guarde las cajas en las que vienen los medicamentos de uso tpico para ayudarle a seguir las instrucciones sobre dnde y cmo usarlos. Las farmacias generalmente imprimen las instrucciones del medicamento slo en las cajas y no directamente en los tubos del [place].   Si su medicamento es muy caro, por favor, pngase en contacto con Zigmund Daniel llamando al [phone] y presione la opcin 4 o envenos un mensaje a travs de Pharmacist, community.   No podemos decirle cul ser su copago por los medicamentos por adelantado ya que esto es diferente dependiendo de la cobertura de su seguro. Sin embargo, es posible que podamos encontrar un medicamento sustituto a Electrical engineer un formulario para que el seguro cubra el medicamento que se considera necesario.   Si se requiere una autorizacin previa para que su compaa de seguros [place] su medicamento, por favor permtanos de 1 a 2 das hbiles para completar este proceso.  Los precios de los medicamentos varan con frecuencia dependiendo del Environmental consultant de dnde se surte la receta y alguna farmacias pueden ofrecer precios  ms baratos.  El sitio web www.goodrx.com tiene cupones para medicamentos de Airline pilot. Los precios aqu no tienen en cuenta lo que podra costar con la ayuda del seguro (puede ser ms barato con su seguro), pero el sitio web puede darle el precio si no utiliz Research scientist (physical sciences).  - Puede imprimir el cupn correspondiente y llevarlo con su receta a la farmacia.  - Tambin puede pasar por nuestra oficina durante el horario de atencin regular y Charity fundraiser una tarjeta de cupones de GoodRx.  - Si necesita que su receta se enve electrnicamente a una farmacia diferente, informe a nuestra oficina a travs de MyChart de [place] o por telfono llamando al [phone] y presione la opcin 4.

## 2021-11-19 NOTE — Progress Notes (Signed)
   Follow-Up Visit   Subjective  Kara [name] is a 76 y.o. female who presents for the following: Follow-up (Patient here today for ISK and wart follow up treated with LN2 at last visit. Patient advises the spots treated did well. She does have a spot at forehead at hairline and a few spots at inner thighs. The spot at forehead did bleed and she has been using Vaseline. ).  Patient with hx of bullous pemphigoid.   The following portions of the chart were reviewed this encounter and updated as appropriate:       Review of Systems:  No other skin or systemic complaints except as noted in HPI or Assessment and Plan.  Objective  Well appearing patient in no apparent distress; mood and affect are within normal limits.  A focused examination was performed including face, scalp, legs. Relevant physical exam findings are noted in the Assessment and Plan. Wart on R upper elbow has cleared  left lower pretibia Hyperpigmented macules x 3  forehead at hairline, medial thighs Crusted papules at forehead at hairline and left medial thigh Edematous pink papule left medial thigh    Assessment & Plan  Post-inflammatory hyperpigmentation left lower pretibia  2ndary to LN2 to treat ISK's which have resolved  Discussed pigmentation will slowly fade with time  Bullous pemphigoid forehead at hairline, medial thighs  Chronic and persistent condition with duration or expected duration over one year. Condition is symptomatic/ bothersome to patient. Not currently at goal.   apply clobetasol ointment to affected areas at hairline twice daily until clear.Avoid applying to face, groin, and axilla. Use as directed. Long-term use can cause thinning of the skin. No longer than 1-2 weeks to medial thighs. Start mupirocin 1-2 times daily to open areas.  Start tacrolimus twice daily to affected areas at medial thighs. Continue doxycycline 100 mg twice daily with food.   Topical steroids (such as  triamcinolone, fluocinolone, fluocinonide, mometasone, clobetasol, halobetasol, betamethasone, hydrocortisone) can cause thinning and lightening of the skin if they are used for too long in the same area. Your physician has selected the right strength medicine for your problem and area affected on the body. Please use your medication only as directed by your physician to prevent side effects.    tacrolimus (PROTOPIC) 0.1 % ointment - forehead at hairline, medial thighs Apply topically 2 (two) times daily. Apply to body folds, face, groin as needed.  Related Medications mupirocin ointment (BACTROBAN) 2 % Apply to open wounds and cover 1-2 times daily until healed.  clobetasol ointment (TEMOVATE) 0.05 % Apply to affected areas twice daily. Avoid applying to face, groin, and axilla. Use as directed. Risk of skin atrophy with long-term use reviewed.  doxycycline (MONODOX) 100 MG capsule Take 1 capsule (100 mg total) by mouth 2 (two) times daily. Take with food.   Return in about 6 months (around 05/22/2022) for bullous pemphigoid.  Graciella Belton, RMA, am acting as scribe for [name], MD .  Documentation: I have reviewed the above documentation for accuracy and completeness, and I agree with the above.  [name] MD

## 2021-11-20 DIAGNOSIS — D631 Anemia in chronic kidney disease: Secondary | ICD-10-CM | POA: Diagnosis not present

## 2021-11-20 DIAGNOSIS — E1122 Type 2 diabetes mellitus with diabetic chronic kidney disease: Secondary | ICD-10-CM | POA: Diagnosis not present

## 2021-11-20 DIAGNOSIS — N2581 Secondary hyperparathyroidism of renal origin: Secondary | ICD-10-CM | POA: Diagnosis not present

## 2021-11-20 DIAGNOSIS — N1832 Chronic kidney disease, stage 3b: Secondary | ICD-10-CM | POA: Diagnosis not present

## 2021-11-20 DIAGNOSIS — I10 Essential (primary) hypertension: Secondary | ICD-10-CM | POA: Diagnosis not present

## 2021-11-20 DIAGNOSIS — R600 Localized edema: Secondary | ICD-10-CM | POA: Diagnosis not present

## 2021-11-21 ENCOUNTER — Ambulatory Visit: Payer: Medicare HMO

## 2021-11-21 ENCOUNTER — Encounter: Payer: Self-pay | Admitting: Podiatry

## 2021-11-21 ENCOUNTER — Ambulatory Visit: Payer: Medicare HMO | Admitting: Podiatry

## 2021-11-21 ENCOUNTER — Ambulatory Visit (INDEPENDENT_AMBULATORY_CARE_PROVIDER_SITE_OTHER): Payer: Medicare HMO

## 2021-11-21 DIAGNOSIS — L97511 Non-pressure chronic ulcer of other part of right foot limited to breakdown of skin: Secondary | ICD-10-CM | POA: Diagnosis not present

## 2021-11-21 DIAGNOSIS — M778 Other enthesopathies, not elsewhere classified: Secondary | ICD-10-CM

## 2021-11-21 DIAGNOSIS — M7751 Other enthesopathy of right foot: Secondary | ICD-10-CM | POA: Diagnosis not present

## 2021-11-21 DIAGNOSIS — M7752 Other enthesopathy of left foot: Secondary | ICD-10-CM

## 2021-11-21 DIAGNOSIS — M7750 Other enthesopathy of unspecified foot: Secondary | ICD-10-CM

## 2021-11-21 MED ORDER — AMOXICILLIN-POT CLAVULANATE 875-125 MG PO TABS: 1.0000 | ORAL_TABLET | Freq: Two times a day (BID) | ORAL | 0 refills | Status: DC

## 2021-11-21 MED ORDER — MUPIROCIN 2 % EX OINT: 1.0000 | TOPICAL_OINTMENT | Freq: Two times a day (BID) | CUTANEOUS | 0 refills | Status: DC

## 2021-11-21 NOTE — Progress Notes (Signed)
Subjective:  Patient ID: Kara [name], female    DOB: 1945-07-09,  MRN: [id number] HPI Chief Complaint  Patient presents with   Toe Pain    3rd toe right and plantar forefoot right - hammertoe with ulcer tip of toe x 6 months, forefoot feels like walking on marbles, also ankles bilateral swell and ache everyday, diabetic-last a1c was 6.4   New Patient (Initial Visit)    76 y.o. female presents with the above complaint.   ROS: Denies fever chills nausea vomiting muscle aches pains calf pain back pain chest pain shortness of breath.  Past Medical History:  Diagnosis Date   Anemia    Arthropathy    Benign neoplasm of breast    Breast cancer (HCC)    Chest pain    Chronic kidney disease    30% kidney function   Diabetes ([place])    Diabetic retinopathy ([place])    Dyspnea    Edema    History of kidney stones    Hyperlipemia    Hypersomnia with sleep apnea    Hypertension    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HCC)    Lumbago    Osteoarthrosis    Ovarian failure    Personal history of radiation therapy    SOB (shortness of breath)    Past Surgical History:  Procedure Laterality Date   BILATERAL SALPINGECTOMY     BREAST BIOPSY Right 2014   neg- core   BREAST BIOPSY Right 12/21/2018   [place] bx, venus clip,  DUCTAL CARCINOMA IN SITU   BREAST BIOPSY Right 12/21/2018   [place] bx, ribbon clip,  FIBROEPITHELIAL PROLIFERATION WITH SCLEROSIS   BREAST BIOPSY Right 01/01/2019   Affirm bx "X" clip-path pending   BREAST DUCTAL SYSTEM EXCISION     BREAST EXCISIONAL BIOPSY Right 2014   neg   BREAST LUMPECTOMY Right 01/22/2019   path pending   CATARACT EXTRACTION     CHOLECYSTECTOMY     CYSTOSCOPY WITH STENT PLACEMENT Left 04/14/2015   Procedure: CYSTOSCOPY WITH STENT PLACEMENT;  Surgeon: Hollice Espy, MD;  Location: [facility];  Service: Urology;  Laterality: Left;   EXTRACORPOREAL SHOCK WAVE LITHOTRIPSY Left 09/29/2014   Procedure: EXTRACORPOREAL SHOCK WAVE LITHOTRIPSY (ESWL);  Surgeon:  Hollice Espy, MD;  Location: [facility];  Service: Urology;  Laterality: Left;   EYE SURGERY     bilateral cataract   IRRIGATION AND DEBRIDEMENT ABSCESS Right 03/24/2019   Procedure: IRRIGATION AND DEBRIDEMENT ABSCESS RIGHT BREAST;  Surgeon: Herbert Pun, MD;  Location: [facility];  Service: General;  Laterality: Right;   LAPAROSCOPIC OOPHERECTOMY Left    PARTIAL MASTECTOMY WITH NEEDLE LOCALIZATION Right 01/22/2019   Procedure: PARTIAL MASTECTOMY WITH NEEDLE LOCALIZATION;  Surgeon: Herbert Pun, MD;  Location: [facility];  Service: General;  Laterality: Right;   TONSILLECTOMY     TUBAL LIGATION     URETEROSCOPY WITH HOLMIUM LASER LITHOTRIPSY Left 04/14/2015   Procedure: URETEROSCOPY WITH HOLMIUM LASER LITHOTRIPSY;  Surgeon: Hollice Espy, MD;  Location: [facility];  Service: Urology;  Laterality: Left;    Current Outpatient Medications:    amoxicillin-clavulanate (AUGMENTIN) 875-125 MG tablet, Take 1 tablet by mouth 2 (two) times daily., Disp: 20 tablet, Rfl: 0   mupirocin ointment (BACTROBAN) 2 %, Apply 1 Application topically 2 (two) times daily., Disp: 22 g, Rfl: 0   Aflibercept (EYLEA) 2 MG/0.05ML SOLN, , Disp: , Rfl:    aspirin EC 81 MG tablet, Take 81 mg by mouth daily. , Disp: , Rfl:    cholecalciferol (VITAMIN  D) 1000 UNITS tablet, Take 2,000 Units by mouth daily. , Disp: , Rfl:    clobetasol ointment (TEMOVATE) 0.05 %, Apply to affected areas twice daily. Avoid applying to face, groin, and axilla. Use as directed. Risk of skin atrophy with long-term use reviewed., Disp: 60 g, Rfl: 2   doxycycline (MONODOX) 100 MG capsule, Take 1 capsule (100 mg total) by mouth 2 (two) times daily. Take with food., Disp: 60 capsule, Rfl: 5   Dulaglutide (TRULICITY) 1.5 FW/2.6VZ SOPN, Inject 1.5 mg into the skin once a week., Disp: 6 mL, Rfl: 4   Ferrous Gluconate 324 (37.5 Fe) MG TABS, Take 324 mg by mouth daily., Disp: , Rfl:    fluocinolone (SYNALAR) 0.01 % external solution, Apply to  affected areas ears and scalp 1-2 times a day as needed for itch., Disp: 60 mL, Rfl: 1   furosemide (LASIX) 20 MG tablet, Take 20 mg by mouth 2 (two) times daily., Disp: , Rfl:    hydrOXYzine (ATARAX/VISTARIL) 10 MG tablet, TAKE 1-3 TABLETS BY MOUTH EVERY NIGHT AS NEEDED FOR ITCH., Disp: 90 tablet, Rfl: 1   insulin detemir (LEVEMIR FLEXPEN) 100 UNIT/ML FlexPen, INJECT UP TO 75 UNITS UNDER THE SKIN DAILY, TITRATE AS NEEDED., Disp: 15 mL, Rfl: 2   ketoconazole (NIZORAL) 2 % shampoo, Massage into scalp, let sit several minutes before rinsing. Use 2-3x/wk., Disp: 120 mL, Rfl: 3   Lactobacillus (PROBIOTIC ACIDOPHILUS) CAPS, Take 1 capsule by mouth daily., Disp: , Rfl:    lisinopril (ZESTRIL) 10 MG tablet, Take 10 mg by mouth daily., Disp: , Rfl:    methocarbamol (ROBAXIN) 500 MG tablet, Take 1 tablet (500 mg total) by mouth every 8 (eight) hours as needed for muscle spasms., Disp: 60 tablet, Rfl: 0   Multiple Vitamin (MULTIVITAMIN) tablet, Take 1 tablet by mouth daily at 12 noon., Disp: , Rfl:    mupirocin ointment (BACTROBAN) 2 %, Apply to open wounds and cover 1-2 times daily until healed., Disp: 22 g, Rfl: 0   NOVOLOG FLEXPEN 100 UNIT/ML FlexPen, INJECT [place] TWICE DAILY AS DIRECTED PER SLIDING SCALE. Ahmani Daoud DAILY DOSE IS 30 UNITS. DX E11.65., Disp: 15 mL, Rfl: 3   Omega-3 Fatty Acids (FISH OIL OMEGA-3 PO), Take 25 mg by mouth daily., Disp: , Rfl:    pantoprazole (PROTONIX) 40 MG tablet, TAKE 1 TABLET BY MOUTH EVERY DAY, Disp: 90 tablet, Rfl: 1   pregabalin (LYRICA) 100 MG capsule, TAKE 1 CAPSULE BY MOUTH TWICE A DAY, Disp: 60 capsule, Rfl: 2   rosuvastatin (CRESTOR) 20 MG tablet, Take 1 tablet (20 mg total) by mouth daily., Disp: 90 tablet, Rfl: 3   tacrolimus (PROTOPIC) 0.1 % ointment, Apply topically 2 (two) times daily. Apply to body folds, face, groin as needed., Disp: 100 g, Rfl: 2   tamoxifen (NOLVADEX) 20 MG tablet, Take 1 tablet (20 mg total) by mouth daily., Disp: 90 tablet, Rfl: 3    traMADol-acetaminophen (ULTRACET) 37.5-325 MG tablet, Take 1 tablet by mouth 2 (two) times daily as needed for moderate pain., Disp: 60 tablet, Rfl: 1   triamcinolone (KENALOG) 0.025 % cream, Apply 1 application. topically 2 (two) times daily., Disp: 80 g, Rfl: 2   verapamil (CALAN) 40 MG tablet, Take 1 tablet (40 mg total) by mouth 2 (two) times daily., Disp: 180 tablet, Rfl: 1  No Known Allergies Review of Systems Objective:  There were no vitals filed for this visit.  General: Well developed, nourished, in no acute distress, alert and oriented x3  Dermatological: Skin is warm, dry and supple bilateral. Nails x 10 are well maintained; remaining integument appears unremarkable at this time. There are no open sores, no preulcerative lesions, no rash or signs of infection present.  Ulcerative lesion to the distal aspect of the third digit of the right foot most likely secondary to trauma.  She also walks on the distal aspect of the toe which is resulting in skin breakdown.  She does have a cellulitic process that extends just proximal to the PIPJ dorsally.  No purulence no malodor  Vascular: Dorsalis Pedis artery and Posterior Tibial artery pedal pulses are 2/4 bilateral with immedate capillary fill time. Pedal hair growth present. No varicosities and positive lower extremity edema grade 2 pitting present bilateral.   Neruologic: Grossly intact via light touch bilateral. Vibratory intact via tuning fork bilateral. Protective threshold with Semmes Wienstein monofilament diminished o all pedal sites bilateral. Patellar and Achilles deep tendon reflexes 2+ bilateral. No Babinski or clonus noted bilateral.   Musculoskeletal: No gross boney pedal deformities bilateral. No pain, crepitus, or limitation noted with foot and ankle range of motion bilateral. Muscular strength 5/5 in all groups tested bilateral.  Flexible hammertoe deformities bilateral fat pad atrophy.  Gait: Unassisted, Nonantalgic.     Radiographs:  None taken  Assessment & Plan:   Assessment: Edema bilateral lower extremity with history of heart problems, diabetic ulceration third digit right foot.  Fat pad atrophy.  Plan: Recommended that she follow-up with cardiology for her swelling.  She did not want to go have a test for venous incompetence.  We are going to start soaking the toe in Epsom salts and warm water at least once daily I also recommended that she start applying a small amount of Bactroban ointment which we prescribed today and we started her on Augmentin.  Also placed her in a sulcus pad.  She will dress the toe daily and I will follow-up with her in 2 weeks placed her in a [place] shoe as well     Jhordyn Hoopingarner T. [place], [place]

## 2021-11-23 ENCOUNTER — Telehealth: Payer: Self-pay

## 2021-11-23 NOTE — Telephone Encounter (Signed)
No follow up notes are needed at this time.

## 2021-11-26 DIAGNOSIS — H34832 Tributary (branch) retinal vein occlusion, left eye, with macular edema: Secondary | ICD-10-CM | POA: Diagnosis not present

## 2021-11-27 MED ORDER — CEPHALEXIN 500 MG PO CAPS: 500.0000 mg | ORAL_CAPSULE | Freq: Three times a day (TID) | ORAL | 0 refills | Status: DC

## 2021-11-27 NOTE — Addendum Note (Signed)
Addended by: Rip Harbour on: 11/27/2021 08:20 AM   Modules accepted: Orders

## 2021-11-28 NOTE — Telephone Encounter (Signed)
Patient is aware 

## 2021-12-02 ENCOUNTER — Encounter: Payer: Self-pay | Admitting: Nurse Practitioner

## 2021-12-05 ENCOUNTER — Ambulatory Visit (INDEPENDENT_AMBULATORY_CARE_PROVIDER_SITE_OTHER): Payer: Medicare HMO | Admitting: Podiatry

## 2021-12-05 ENCOUNTER — Encounter: Payer: Self-pay | Admitting: Podiatry

## 2021-12-05 DIAGNOSIS — L97511 Non-pressure chronic ulcer of other part of right foot limited to breakdown of skin: Secondary | ICD-10-CM

## 2021-12-05 NOTE — Progress Notes (Signed)
She presents today with her husband for follow-up of ulceration to the third digit of the right foot.  She states that is doing much better she is very happy with that and she loves her crest pads or buttress pads.  She states that she is ordered some online and they are helping her a lot.  Objective: Vital signs are stable she alert oriented x3 there is no erythema edema salines drainage or odor to the distal aspect of the toe though there is still some proximal erythema at the dorsal aspect of the DIPJ.  No open lesions or wounds though that is gone on to heal uneventfully.  Assessment: Well-healing ulcerative digit third right.  Plan: Encouraged her to continue the use of the sulcus pads or buttress buttress pads and I will follow-up with her on an as-needed basis.

## 2021-12-13 ENCOUNTER — Telehealth: Payer: Self-pay

## 2021-12-13 NOTE — Telephone Encounter (Signed)
Pt called that she is coughing wheezing and sore throat offer her appt and then she was asking appt for her husband he is not pt here advised her that he can go to urgent care and we can make her appt pt said never mind and  pt Hung up phone

## 2021-12-21 ENCOUNTER — Inpatient Hospital Stay
Admission: EM | Admit: 2021-12-21 | Discharge: 2021-12-27 | DRG: 871 | Disposition: A | Discharge status: Home / Self Care | Payer: Medicare HMO | Attending: Osteopathic Medicine | Admitting: Osteopathic Medicine

## 2021-12-21 ENCOUNTER — Other Ambulatory Visit: Payer: Self-pay

## 2021-12-21 DIAGNOSIS — I10 Essential (primary) hypertension: Secondary | ICD-10-CM | POA: Diagnosis present

## 2021-12-21 DIAGNOSIS — I130 Hypertensive heart and chronic kidney disease with heart failure and stage 1 through stage 4 chronic kidney disease, or unspecified chronic kidney disease: Secondary | ICD-10-CM | POA: Diagnosis present

## 2021-12-21 DIAGNOSIS — I959 Hypotension, unspecified: Secondary | ICD-10-CM | POA: Diagnosis not present

## 2021-12-21 DIAGNOSIS — N184 Chronic kidney disease, stage 4 (severe): Secondary | ICD-10-CM | POA: Diagnosis present

## 2021-12-21 DIAGNOSIS — E785 Hyperlipidemia, unspecified: Secondary | ICD-10-CM | POA: Diagnosis present

## 2021-12-21 DIAGNOSIS — N179 Acute kidney failure, unspecified: Secondary | ICD-10-CM | POA: Diagnosis present

## 2021-12-21 DIAGNOSIS — D631 Anemia in chronic kidney disease: Secondary | ICD-10-CM | POA: Diagnosis present

## 2021-12-21 DIAGNOSIS — A0839 Other viral enteritis: Secondary | ICD-10-CM | POA: Diagnosis present

## 2021-12-21 DIAGNOSIS — Z800 Family history of malignant neoplasm of digestive organs: Secondary | ICD-10-CM

## 2021-12-21 DIAGNOSIS — K219 Gastro-esophageal reflux disease without esophagitis: Secondary | ICD-10-CM | POA: Diagnosis present

## 2021-12-21 DIAGNOSIS — E119 Type 2 diabetes mellitus without complications: Secondary | ICD-10-CM

## 2021-12-21 DIAGNOSIS — R0902 Hypoxemia: Secondary | ICD-10-CM | POA: Diagnosis not present

## 2021-12-21 DIAGNOSIS — R059 Cough, unspecified: Secondary | ICD-10-CM | POA: Diagnosis not present

## 2021-12-21 DIAGNOSIS — E11319 Type 2 diabetes mellitus with unspecified diabetic retinopathy without macular edema: Secondary | ICD-10-CM | POA: Diagnosis present

## 2021-12-21 DIAGNOSIS — E860 Dehydration: Secondary | ICD-10-CM | POA: Diagnosis present

## 2021-12-21 DIAGNOSIS — E669 Obesity, unspecified: Secondary | ICD-10-CM | POA: Diagnosis present

## 2021-12-21 DIAGNOSIS — J1282 Pneumonia due to coronavirus disease 2019: Secondary | ICD-10-CM

## 2021-12-21 DIAGNOSIS — Z923 Personal history of irradiation: Secondary | ICD-10-CM

## 2021-12-21 DIAGNOSIS — Z794 Long term (current) use of insulin: Secondary | ICD-10-CM | POA: Diagnosis not present

## 2021-12-21 DIAGNOSIS — R000 Tachycardia, unspecified: Secondary | ICD-10-CM | POA: Diagnosis not present

## 2021-12-21 DIAGNOSIS — E872 Acidosis, unspecified: Secondary | ICD-10-CM | POA: Diagnosis present

## 2021-12-21 DIAGNOSIS — J189 Pneumonia, unspecified organism: Secondary | ICD-10-CM

## 2021-12-21 DIAGNOSIS — A419 Sepsis, unspecified organism: Secondary | ICD-10-CM | POA: Diagnosis not present

## 2021-12-21 DIAGNOSIS — Z6831 Body mass index (BMI) 31.0-31.9, adult: Secondary | ICD-10-CM

## 2021-12-21 DIAGNOSIS — A4189 Other specified sepsis: Secondary | ICD-10-CM | POA: Diagnosis present

## 2021-12-21 DIAGNOSIS — I5033 Acute on chronic diastolic (congestive) heart failure: Secondary | ICD-10-CM | POA: Clinically undetermined

## 2021-12-21 DIAGNOSIS — R509 Fever, unspecified: Secondary | ICD-10-CM | POA: Diagnosis not present

## 2021-12-21 DIAGNOSIS — Z853 Personal history of malignant neoplasm of breast: Secondary | ICD-10-CM | POA: Diagnosis not present

## 2021-12-21 DIAGNOSIS — K5790 Diverticulosis of intestine, part unspecified, without perforation or abscess without bleeding: Secondary | ICD-10-CM | POA: Diagnosis present

## 2021-12-21 DIAGNOSIS — Z801 Family history of malignant neoplasm of trachea, bronchus and lung: Secondary | ICD-10-CM

## 2021-12-21 DIAGNOSIS — R531 Weakness: Secondary | ICD-10-CM

## 2021-12-21 DIAGNOSIS — N189 Chronic kidney disease, unspecified: Secondary | ICD-10-CM | POA: Diagnosis present

## 2021-12-21 DIAGNOSIS — I5031 Acute diastolic (congestive) heart failure: Secondary | ICD-10-CM | POA: Diagnosis not present

## 2021-12-21 DIAGNOSIS — U071 COVID-19: Secondary | ICD-10-CM

## 2021-12-21 DIAGNOSIS — Z87442 Personal history of urinary calculi: Secondary | ICD-10-CM

## 2021-12-21 DIAGNOSIS — K5730 Diverticulosis of large intestine without perforation or abscess without bleeding: Secondary | ICD-10-CM | POA: Diagnosis present

## 2021-12-21 DIAGNOSIS — D649 Anemia, unspecified: Secondary | ICD-10-CM | POA: Diagnosis present

## 2021-12-21 DIAGNOSIS — R6520 Severe sepsis without septic shock: Secondary | ICD-10-CM | POA: Diagnosis present

## 2021-12-21 DIAGNOSIS — Z803 Family history of malignant neoplasm of breast: Secondary | ICD-10-CM

## 2021-12-21 DIAGNOSIS — E1122 Type 2 diabetes mellitus with diabetic chronic kidney disease: Secondary | ICD-10-CM | POA: Diagnosis present

## 2021-12-21 DIAGNOSIS — Z79899 Other long term (current) drug therapy: Secondary | ICD-10-CM

## 2021-12-21 DIAGNOSIS — R0600 Dyspnea, unspecified: Secondary | ICD-10-CM | POA: Diagnosis not present

## 2021-12-21 DIAGNOSIS — R051 Acute cough: Principal | ICD-10-CM

## 2021-12-21 DIAGNOSIS — J9601 Acute respiratory failure with hypoxia: Secondary | ICD-10-CM | POA: Diagnosis present

## 2021-12-21 DIAGNOSIS — Z87891 Personal history of nicotine dependence: Secondary | ICD-10-CM

## 2021-12-21 DIAGNOSIS — R918 Other nonspecific abnormal finding of lung field: Secondary | ICD-10-CM | POA: Diagnosis not present

## 2021-12-21 DIAGNOSIS — Z881 Allergy status to other antibiotic agents status: Secondary | ICD-10-CM

## 2021-12-21 DIAGNOSIS — Z7982 Long term (current) use of aspirin: Secondary | ICD-10-CM

## 2021-12-21 DIAGNOSIS — R0689 Other abnormalities of breathing: Secondary | ICD-10-CM | POA: Diagnosis not present

## 2021-12-21 HISTORY — DX: Pneumonia, unspecified organism: J18.9

## 2021-12-21 HISTORY — DX: Pneumonia due to coronavirus disease 2019: J12.82

## 2021-12-21 HISTORY — DX: COVID-19: U07.1

## 2021-12-21 LAB — COMPREHENSIVE METABOLIC PANEL
ALT: 18 U/L (ref 0–44)
AST: 32 U/L (ref 15–41)
Albumin: 2.7 g/dL — ABNORMAL LOW (ref 3.5–5.0)
Alkaline Phosphatase: 79 U/L (ref 38–126)
Anion gap: 12 (ref 5–15)
BUN: 27 mg/dL — ABNORMAL HIGH (ref 8–23)
CO2: 20 mmol/L — ABNORMAL LOW (ref 22–32)
Calcium: 8.6 mg/dL — ABNORMAL LOW (ref 8.9–10.3)
Chloride: 107 mmol/L (ref 98–111)
Creatinine, Ser: 2.05 mg/dL — ABNORMAL HIGH (ref 0.44–1.00)
GFR, Estimated: 25 mL/min — ABNORMAL LOW (ref >60)
Glucose, Bld: 225 mg/dL — ABNORMAL HIGH (ref 70–99)
Potassium: 3.7 mmol/L (ref 3.5–5.1)
Sodium: 139 mmol/L (ref 135–145)
Total Bilirubin: 1 mg/dL (ref 0.3–1.2)
Total Protein: 7.1 g/dL (ref 6.5–8.1)

## 2021-12-21 LAB — CBC WITH DIFFERENTIAL/PLATELET
Abs Immature Granulocytes: 0.04 10*3/uL (ref 0.00–0.07)
Basophils Absolute: 0 10*3/uL (ref 0.0–0.1)
Basophils Relative: 0 %
Eosinophils Absolute: 0 10*3/uL (ref 0.0–0.5)
Eosinophils Relative: 0 %
HCT: 32.8 % — ABNORMAL LOW (ref 36.0–46.0)
Hemoglobin: 10.2 g/dL — ABNORMAL LOW (ref 12.0–15.0)
Immature Granulocytes: 0 %
Lymphocytes Relative: 18 %
Lymphs Abs: 1.7 10*3/uL (ref 0.7–4.0)
MCH: 27.7 pg (ref 26.0–34.0)
MCHC: 31.1 g/dL (ref 30.0–36.0)
MCV: 89.1 fL (ref 80.0–100.0)
Monocytes Absolute: 0.7 10*3/uL (ref 0.1–1.0)
Monocytes Relative: 8 %
Neutro Abs: 6.9 10*3/uL (ref 1.7–7.7)
Neutrophils Relative %: 74 %
Platelets: 167 10*3/uL (ref 150–400)
RBC: 3.68 MIL/uL — ABNORMAL LOW (ref 3.87–5.11)
RDW: 16.1 % — ABNORMAL HIGH (ref 11.5–15.5)
WBC: 9.4 10*3/uL (ref 4.0–10.5)
nRBC: 0 % (ref 0.0–0.2)

## 2021-12-21 LAB — LACTIC ACID, PLASMA: Lactic Acid, Venous: 2.1 mmol/L (ref 0.5–1.9)

## 2021-12-21 LAB — TROPONIN I (HIGH SENSITIVITY): Troponin I (High Sensitivity): 11 ng/L (ref <18)

## 2021-12-21 LAB — SARS CORONAVIRUS 2 BY RT PCR: SARS Coronavirus 2 by RT PCR: POSITIVE — AB

## 2021-12-21 MED ORDER — SODIUM CHLORIDE 0.9 % IV SOLN
500.0000 mg | INTRAVENOUS | Status: AC
  Administered 2021-12-21 – 2021-12-25 (×5): 500 mg via INTRAVENOUS
  Filled 2021-12-21 (×2): qty 5
  Filled 2021-12-21: qty 500
  Filled 2021-12-21: qty 5
  Filled 2021-12-21: qty 500

## 2021-12-21 MED ORDER — LACTATED RINGERS IV BOLUS (SEPSIS)
1000.0000 mL | Freq: Once | INTRAVENOUS | Status: AC
  Administered 2021-12-21: 1000 mL via INTRAVENOUS

## 2021-12-21 MED ORDER — SODIUM CHLORIDE 0.9 % IV SOLN
2.0000 g | INTRAVENOUS | Status: AC
  Administered 2021-12-21 – 2021-12-25 (×5): 2 g via INTRAVENOUS
  Filled 2021-12-21: qty 20
  Filled 2021-12-21: qty 2
  Filled 2021-12-21 (×2): qty 20
  Filled 2021-12-21: qty 2

## 2021-12-21 MED ORDER — LACTATED RINGERS IV SOLN: INTRAVENOUS | Status: AC

## 2021-12-21 NOTE — ED Notes (Signed)
Patient provided with pillow and lights dimmed for comfort.  Patient updated on plan of care.  No concerns voiced at this time

## 2021-12-21 NOTE — ED Provider Notes (Signed)
[facility] Provider Note    Event Date/Time   First MD Initiated Contact with Patient 12/21/21 2120     (approximate)   History   No chief complaint on file.   HPI  Kara [name] is a 76 y.o. female who reports she has had a dry cough for several weeks and last few days she has been coughing up some phlegm.  Uncertain of the color but it is thick.  Today patient was sitting on the couch and tried to get up and was too weak to do so.  She had 102 axillary fever per EMS.  Tachycardia but no hypotension      Physical Exam   Triage Vital Signs: ED Triage Vitals  Enc Vitals Group     BP      Pulse      Resp      Temp      Temp src      SpO2      Weight      Height      Head Circumference      Peak Flow      Pain Score      Pain Loc      Pain Edu?      Excl. in [place]?     Most recent vital signs: There were no vitals filed for this visit.   General: Awake, no distress.  CV:  Good peripheral perfusion.  Heart regular rate and rhythm no audible murmur she is somewhat tacky Resp:  Normal effort.  Some scattered crackles in the bases right worse than left Abd:  No distention.  Soft nontender except for where she has bruising from her insulin needle injections Extremities: No edema   ED Results / Procedures / Treatments   Labs (all labs ordered are listed, but only abnormal results are displayed) Labs Reviewed  CULTURE, BLOOD (SINGLE)  SARS CORONAVIRUS 2 BY RT PCR     EKG  EKG read and interpreted by me shows sinus tach rate of 126 normal axis nonspecific ST-T wave changes   RADIOLOGY *** {USE THE WORD "INTERPRETED"!! You MUST document your own interpretation of imaging, as well as the fact that you reviewed the radiologist's report!:1}   PROCEDURES:  Critical Care performed: {CriticalCareYesNo:19197::"Yes, see critical care procedure note(s)","No"}  Procedures   MEDICATIONS ORDERED IN ED: Medications  lactated ringers  infusion (has no administration in time range)  lactated ringers bolus 1,000 mL (has no administration in time range)    And  lactated ringers bolus 1,000 mL (has no administration in time range)    And  lactated ringers bolus 1,000 mL (has no administration in time range)  cefTRIAXone (ROCEPHIN) 2 g in sodium chloride 0.9 % 100 mL IVPB (has no administration in time range)  azithromycin (ZITHROMAX) 500 mg in sodium chloride 0.9 % 250 mL IVPB (has no administration in time range)     IMPRESSION / MDM / [place] / ED COURSE  I reviewed the triage vital signs and the nursing notes.                              Differential diagnosis includes, but is not limited to, ***  Patient's presentation is most consistent with {EM COPA:27473}  {If the patient is on the monitor, remove the brackets and asterisks on the sentence below and remember to document it as a Procedure as well.  Otherwise delete the sentence below:1} {**The patient is on the cardiac monitor to evaluate for evidence of arrhythmia and/or significant heart rate changes.**} {Remember to include, when applicable, any/all of the following data: independent review of imaging independent review of labs (comment specifically on pertinent positives and negatives) review of specific prior hospitalizations, PCP/specialist notes, etc. discuss meds given and prescribed document any discussion with consultants (including hospitalists) any clinical decision tools you used and why (PECARN, NEXUS, etc.) did you consider admitting the patient? document social determinants of health affecting patient's care (homelessness, inability to follow up in a timely fashion, etc) document any pre-existing conditions increasing risk on current visit (e.g. diabetes and HTN increasing danger of high-risk chest pain/ACS) describes what meds you gave (especially parenteral) and why any other interventions?:1}     FINAL CLINICAL IMPRESSION(S) /  ED DIAGNOSES   Final diagnoses:  None     Rx / DC Orders   ED Discharge Orders     None        Note:  This document was prepared using Dragon voice recognition software and may include unintentional dictation errors.

## 2021-12-21 NOTE — Progress Notes (Signed)
CODE SEPSIS - PHARMACY COMMUNICATION  **Broad Spectrum Antibiotics should be administered within 1 hour of Sepsis diagnosis**  Time Code Sepsis Called/Page Received: 9/1 @ 2121   Antibiotics Ordered: Ceftriaxone, Azithromycin   Time of 1st antibiotic administration: Ceftriaxone 2 gm IV X 1 on 9/1 @ 2135  Additional action taken by pharmacy:   If necessary, Name of Provider/Nurse Contacted:     Jujuan Dugo D ,PharmD Clinical Pharmacist  12/21/2021  10:00 PM

## 2021-12-21 NOTE — Sepsis Progress Note (Signed)
Following for sepsis monitoring ?

## 2021-12-21 NOTE — ED Triage Notes (Signed)
Weakness with cough, fever, and SOB. Arrives on 2L [place]. Spo2 88% RA. 2  L re- applied. Pt febrile. Reports productive cough at home. Pt alert and following commands appropriately. Wheeze noted bilaterally with rhonchii in lower lung fields. EKG obtained and cardiac monitor in place.

## 2021-12-21 NOTE — H&P (Incomplete)
History and Physical    Kara [name] ZYS:[id number] DOB: 1945/11/05 DOA: 12/21/2021  PCP: Jonetta Osgood, NP    Patient coming from:  Home    Chief Complaint:  Cough.   HPI:  Kara [name] is a 76 y.o. female seen in ed for cough found to be covid positive and meet sepsis criteria. Chest xray is ordered.  HPI limited due to age. EKG is S tach at 126/ nl axis, T wave flattening inf.Symptoms for few weeks and few days of productive cough and tonight it got worse and she got really weak and called EMS.    Pt has past medical history of ***   Past Medical History:  Diagnosis Date   Anemia    Arthropathy    Benign neoplasm of breast    Breast cancer ([place])    Chest pain    Chronic kidney disease    30% kidney function   Diabetes ([place])    Diabetic retinopathy ([place])    Dyspnea    Edema    History of kidney stones    Hyperlipemia    Hypersomnia with sleep apnea    Hypertension    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HCC)    Lumbago    Osteoarthrosis    Ovarian failure    Personal history of radiation therapy    SOB (shortness of breath)     Past Surgical History:  Procedure Laterality Date   BILATERAL SALPINGECTOMY     BREAST BIOPSY Right 2014   neg- core   BREAST BIOPSY Right 12/21/2018   [place] bx, venus clip,  DUCTAL CARCINOMA IN SITU   BREAST BIOPSY Right 12/21/2018   [place] bx, ribbon clip,  FIBROEPITHELIAL PROLIFERATION WITH SCLEROSIS   BREAST BIOPSY Right 01/01/2019   Affirm bx "X" clip-path pending   BREAST DUCTAL SYSTEM EXCISION     BREAST EXCISIONAL BIOPSY Right 2014   neg   BREAST LUMPECTOMY Right 01/22/2019   path pending   CATARACT EXTRACTION     CHOLECYSTECTOMY     CYSTOSCOPY WITH STENT PLACEMENT Left 04/14/2015   Procedure: CYSTOSCOPY WITH STENT PLACEMENT;  Surgeon: Hollice Espy, MD;  Location: [facility];  Service: Urology;  Laterality: Left;   EXTRACORPOREAL SHOCK WAVE LITHOTRIPSY Left 09/29/2014   Procedure: EXTRACORPOREAL SHOCK WAVE LITHOTRIPSY  (ESWL);  Surgeon: Hollice Espy, MD;  Location: [facility];  Service: Urology;  Laterality: Left;   EYE SURGERY     bilateral cataract   IRRIGATION AND DEBRIDEMENT ABSCESS Right 03/24/2019   Procedure: IRRIGATION AND DEBRIDEMENT ABSCESS RIGHT BREAST;  Surgeon: Herbert Pun, MD;  Location: [facility];  Service: General;  Laterality: Right;   LAPAROSCOPIC OOPHERECTOMY Left    PARTIAL MASTECTOMY WITH NEEDLE LOCALIZATION Right 01/22/2019   Procedure: PARTIAL MASTECTOMY WITH NEEDLE LOCALIZATION;  Surgeon: Herbert Pun, MD;  Location: [facility];  Service: General;  Laterality: Right;   TONSILLECTOMY     TUBAL LIGATION     URETEROSCOPY WITH HOLMIUM LASER LITHOTRIPSY Left 04/14/2015   Procedure: URETEROSCOPY WITH HOLMIUM LASER LITHOTRIPSY;  Surgeon: Hollice Espy, MD;  Location: [facility];  Service: Urology;  Laterality: Left;     reports that she quit smoking about 26 years ago. Her smoking use included cigarettes. She has a 30.00 pack-year smoking history. She has never used smokeless tobacco. She reports that she does not drink alcohol and does not use drugs.  Allergies  Allergen Reactions   Amoxicillin Nausea And Vomiting    Family History  Problem Relation Age of Onset  Breast cancer Maternal Grandmother 90   Colon cancer Mother    Lung cancer Father     Prior to Admission medications   Medication Sig Start Date End Date Taking? Authorizing Provider  Aflibercept Alfonse Flavors) 2 MG/0.05ML SOLN     [provider]  aspirin EC 81 MG tablet Take 81 mg by mouth daily.     [provider]  cholecalciferol (VITAMIN D) 1000 UNITS tablet Take 2,000 Units by mouth daily.     [provider]  clobetasol ointment (TEMOVATE) 0.05 % Apply to affected areas twice daily. Avoid applying to face, groin, and axilla. Use as directed. Risk of skin atrophy with long-term use reviewed. 04/30/21   [name], MD  doxycycline (MONODOX) 100 MG capsule Take 1 capsule (100 mg  total) by mouth 2 (two) times daily. Take with food. 09/03/21   [name], MD  Dulaglutide (TRULICITY) 1.5 GG/8.3MO SOPN Inject 1.5 mg into the skin once a week. 11/10/20   Lavera Guise, MD  Ferrous Gluconate 324 (37.5 Fe) MG TABS Take 324 mg by mouth daily.    [provider]  fluocinolone (SYNALAR) 0.01 % external solution Apply to affected areas ears and scalp 1-2 times a day as needed for itch. 04/30/21   [name], MD  furosemide (LASIX) 20 MG tablet Take 20 mg by mouth 2 (two) times daily.    [provider]  hydrOXYzine (ATARAX/VISTARIL) 10 MG tablet TAKE 1-3 TABLETS BY MOUTH EVERY NIGHT AS NEEDED FOR ITCH. 09/05/20   [name], MD  insulin detemir (LEVEMIR FLEXPEN) 100 UNIT/ML FlexPen INJECT UP TO 75 UNITS UNDER THE SKIN DAILY, TITRATE AS NEEDED. 10/15/21   Jonetta Osgood, NP  ketoconazole (NIZORAL) 2 % shampoo Massage into scalp, let sit several minutes before rinsing. Use 2-3x/wk. 04/30/21   [name], MD  Lactobacillus (PROBIOTIC ACIDOPHILUS) CAPS Take 1 capsule by mouth daily.    [provider]  lisinopril (ZESTRIL) 10 MG tablet Take 10 mg by mouth daily.    [provider]  methocarbamol (ROBAXIN) 500 MG tablet Take 1 tablet (500 mg total) by mouth every 8 (eight) hours as needed for muscle spasms. 01/12/21   Jonetta Osgood, NP  Multiple Vitamin (MULTIVITAMIN) tablet Take 1 tablet by mouth daily at 12 noon.    [provider]  mupirocin ointment (BACTROBAN) 2 % Apply 1 Application topically 2 (two) times daily. 11/21/21   Hyatt, Max T, DPM  NOVOLOG FLEXPEN 100 UNIT/ML FlexPen INJECT [place] TWICE DAILY AS DIRECTED PER SLIDING SCALE. MAX DAILY DOSE IS 30 UNITS. DX E11.65. 10/17/20   Lavera Guise, MD  Omega-3 Fatty Acids (FISH OIL OMEGA-3 PO) Take 25 mg by mouth daily.    [provider]  pantoprazole (PROTONIX) 40 MG tablet TAKE 1 TABLET BY MOUTH EVERY DAY 10/11/21   Jonetta Osgood, NP  pregabalin (LYRICA) 100 MG capsule TAKE  1 CAPSULE BY MOUTH TWICE A DAY 11/19/21   Abernathy, Yetta Flock, NP  rosuvastatin (CRESTOR) 20 MG tablet Take 1 tablet (20 mg total) by mouth daily. 04/13/21   Jonetta Osgood, NP  [facility] injection  10/16/21   [provider]  tacrolimus (PROTOPIC) 0.1 % ointment Apply topically 2 (two) times daily. Apply to body folds, face, groin as needed. 11/19/21   [name], MD  tamoxifen (NOLVADEX) 20 MG tablet Take 1 tablet (20 mg total) by mouth daily. 08/08/21   Lloyd Huger, MD  traMADol-acetaminophen (ULTRACET) 37.5-325 MG tablet Take 1 tablet by mouth 2 (  two) times daily as needed for moderate pain. 07/13/21   Jonetta Osgood, NP  triamcinolone (KENALOG) 0.025 % cream Apply 1 application. topically 2 (two) times daily. 07/13/21   Jonetta Osgood, NP  verapamil (CALAN) 40 MG tablet Take 1 tablet (40 mg total) by mouth 2 (two) times daily. 10/11/21   Jonetta Osgood, NP    Review of Systems:  ROS   ED Course:   > Vitals:   12/21/21 2200 12/21/21 2253 12/21/21 2330 12/21/21 2345  BP: (!) 112/37 (!) 122/46 (!) 119/45   Pulse: (!) 117 (!) 116 (!) 102 (!) 102  Resp: (!) 26 (!) 26 (!) 26 (!) 24  Temp:  100 F (37.8 C)    TempSrc:  Oral    SpO2: 93% 96% 97% 97%  Weight:      Height:       > Vitals:   12/21/21 2129 12/21/21 2200 12/21/21 2253 12/21/21 2330  BP: (!) 148/116 (!) 112/37 (!) 122/46 (!) 119/45    >No intake/output data recorded. >Total I/O In: 2350 [IV Piggyback:2350] Out: -  >SpO2: 97 % O2 Flow Rate (L/min): 2 L/min  ***    Results for orders placed or performed during the hospital encounter of 12/21/21 (from the past 24 hour(s))  SARS Coronavirus 2 by RT PCR (hospital order, performed in [facility] hospital lab) *cepheid single result test* Anterior Nasal Swab     Status: Abnormal   Collection Time: 12/21/21  9:41 PM   Specimen: Anterior Nasal Swab  Result Value Ref Range   SARS Coronavirus 2 by RT PCR POSITIVE (A) NEGATIVE  Comprehensive  metabolic panel     Status: Abnormal   Collection Time: 12/21/21  9:41 PM  Result Value Ref Range   Sodium 139 135 - 145 mmol/L   Potassium 3.7 3.5 - 5.1 mmol/L   Chloride 107 98 - 111 mmol/L   CO2 20 (L) 22 - 32 mmol/L   Glucose, Bld 225 (H) 70 - 99 mg/dL   BUN 27 (H) 8 - 23 mg/dL   Creatinine, Ser 2.05 (H) 0.44 - 1.00 mg/dL   Calcium 8.6 (L) 8.9 - 10.3 mg/dL   Total Protein 7.1 6.5 - 8.1 g/dL   Albumin 2.7 (L) 3.5 - 5.0 g/dL   AST 32 15 - 41 U/L   ALT 18 0 - 44 U/L   Alkaline Phosphatase 79 38 - 126 U/L   Total Bilirubin 1.0 0.3 - 1.2 mg/dL   GFR, Estimated 25 (L) >60 mL/min   Anion gap 12 5 - 15  Lactic acid, plasma     Status: Abnormal   Collection Time: 12/21/21  9:41 PM  Result Value Ref Range   Lactic Acid, Venous 2.1 (HH) 0.5 - 1.9 mmol/L  Troponin I (High Sensitivity)     Status: None   Collection Time: 12/21/21  9:41 PM  Result Value Ref Range   Troponin I (High Sensitivity) 11 <18 ng/L  CBC with Differential     Status: Abnormal   Collection Time: 12/21/21  9:41 PM  Result Value Ref Range   WBC 9.4 4.0 - 10.5 K/uL   RBC 3.68 (L) 3.87 - 5.11 MIL/uL   Hemoglobin 10.2 (L) 12.0 - 15.0 g/dL   HCT 32.8 (L) 36.0 - 46.0 %   MCV 89.1 80.0 - 100.0 fL   MCH 27.7 26.0 - 34.0 pg   MCHC 31.1 30.0 - 36.0 g/dL   RDW 16.1 (H) 11.5 - 15.5 %   Platelets 167  150 - 400 K/uL   nRBC 0.0 0.0 - 0.2 %   Neutrophils Relative % 74 %   Neutro Abs 6.9 1.7 - 7.7 K/uL   Lymphocytes Relative 18 %   Lymphs Abs 1.7 0.7 - 4.0 K/uL   Monocytes Relative 8 %   Monocytes Absolute 0.7 0.1 - 1.0 K/uL   Eosinophils Relative 0 %   Eosinophils Absolute 0.0 0.0 - 0.5 K/uL   Basophils Relative 0 %   Basophils Absolute 0.0 0.0 - 0.1 K/uL   Immature Granulocytes 0 %   Abs Immature Granulocytes 0.04 0.00 - 0.07 K/uL   CBC: Recent Labs  Lab 12/21/21 2141  WBC 9.4  NEUTROABS 6.9  HGB 10.2*  HCT 32.8*  MCV 89.1  PLT 644   Basic Metabolic Panel: Recent Labs  Lab 12/21/21 2141  NA 139  K 3.7   CL 107  CO2 20*  GLUCOSE 225*  BUN 27*  CREATININE 2.05*  CALCIUM 8.6*   GFR: Estimated Creatinine Clearance: 24.9 mL/min (A) (by C-G formula based on SCr of 2.05 mg/dL (H)). Liver Function Tests: Recent Labs  Lab 12/21/21 2141  AST 32  ALT 18  ALKPHOS 79  BILITOT 1.0  PROT 7.1  ALBUMIN 2.7*   No results for input(s): "LIPASE", "AMYLASE" in the last 168 hours. No results for input(s): "AMMONIA" in the last 168 hours. Coagulation Profile: No results for input(s): "INR", "PROTIME" in the last 168 hours. Cardiac Enzymes: No results for input(s): "CKTOTAL", "CKMB", "CKMBINDEX", "TROPONINI" in the last 168 hours. BNP (last 3 results) No results for input(s): "PROBNP" in the last 8760 hours. HbA1C: No results for input(s): "HGBA1C" in the last 72 hours. CBG: No results for input(s): "GLUCAP" in the last 168 hours. Lipid Profile: No results for input(s): "CHOL", "HDL", "LDLCALC", "TRIG", "CHOLHDL", "LDLDIRECT" in the last 72 hours. Thyroid Function Tests: No results for input(s): "TSH", "T4TOTAL", "FREET4", "T3FREE", "THYROIDAB" in the last 72 hours. Anemia Panel: No results for input(s): "VITAMINB12", "FOLATE", "FERRITIN", "TIBC", "IRON", "RETICCTPCT" in the last 72 hours. Urine analysis:    Component Value Date/Time   COLORURINE YELLOW (A) 03/21/2019 1751   APPEARANCEUR Clear 10/11/2021 1150   LABSPEC 1.013 03/21/2019 1751   PHURINE 5.0 03/21/2019 1751   GLUCOSEU Negative 10/11/2021 1150   HGBUR NEGATIVE 03/21/2019 1751   BILIRUBINUR Negative 10/11/2021 1150   KETONESUR NEGATIVE 03/21/2019 1751   PROTEINUR Negative 10/11/2021 [place] 03/21/2019 1751   NITRITE Negative 10/11/2021 1150   NITRITE NEGATIVE 03/21/2019 1751   LEUKOCYTESUR Negative 10/11/2021 1150   LEUKOCYTESUR NEGATIVE 03/21/2019 1751    EKG: Independently reviewed.  ***  Radiological Exams on Admission: No results found.     Physical Exam: Vitals:   12/21/21 2200 12/21/21  2253 12/21/21 2330 12/21/21 2345  BP: (!) 112/37 (!) 122/46 (!) 119/45   Pulse: (!) 117 (!) 116 (!) 102 (!) 102  Resp: (!) 26 (!) 26 (!) 26 (!) 24  Temp:  100 F (37.8 C)    TempSrc:  Oral    SpO2: 93% 96% 97% 97%  Weight:      Height:       Physical Exam     Assessment and Plan: No notes have been filed under this hospital service. Service: Hospitalist      ***   Unresulted Labs (From admission, onward)     Start     Ordered   12/21/21 2215  Resp Panel by RT-PCR (Flu A&B, Covid) Anterior Nasal Swab  ONCE - URGENT,   URGENT        12/21/21 2214   12/21/21 2214  Lactic acid, plasma  Now then every 2 hours,   STAT      12/21/21 2214   12/21/21 2214  Urinalysis, Routine w reflex microscopic  ONCE - URGENT,   URGENT        12/21/21 2214   12/21/21 2214  Urine Culture  Once,   URGENT       Question:  Indication  Answer:  Sepsis   12/21/21 2214   12/21/21 2214  Blood gas, venous  Once,   STAT        12/21/21 2214   12/21/21 2214  Culture, blood (routine x 2)  BLOOD CULTURE X 2,   STAT      12/21/21 2214   12/21/21 2122  Culture, blood (single)  (Undifferentiated -> Now sepsis confirmed (treatment and sepsis specific nursing orders))  ONCE - STAT,   STAT        12/21/21 2121             DVT prophylaxis:  Heparin.    Code Status:  Full code    Family Communication:  Theresea, Trautmann (Spouse)  [phone] (Mobile)   Disposition Plan:  Home    Consults called:  None   Admission status: Inpatient    Unit: Telemetry.    Para Skeans MD [facility]  6 PM- 2 AM. Please contact me via secure Chat 6 PM-2 AM. [phone] ( Pager ) To contact the [facility] Attending or Consulting provider [place] or covering provider during after hours [place], for this patient.   Check the care team in [facility] and look for a) attending/consulting [facility] provider listed and b) the [facility] team listed Log into [email or web address] and use [place]'s universal password to access. If you  do not have the password, please contact the hospital operator. Locate the [facility] provider you are looking for under [facility] and page to a number that you can be directly reached. If you still have difficulty reaching the provider, please page the [facility] (Director on Call) for the Hospitalists listed on amion for assistance. [email or web address] 12/21/2021, 11:53 PM

## 2021-12-22 ENCOUNTER — Inpatient Hospital Stay: Payer: Medicare HMO

## 2021-12-22 ENCOUNTER — Emergency Department: Payer: Medicare HMO

## 2021-12-22 DIAGNOSIS — J1282 Pneumonia due to coronavirus disease 2019: Secondary | ICD-10-CM | POA: Diagnosis present

## 2021-12-22 DIAGNOSIS — R531 Weakness: Secondary | ICD-10-CM

## 2021-12-22 DIAGNOSIS — J9601 Acute respiratory failure with hypoxia: Secondary | ICD-10-CM | POA: Diagnosis present

## 2021-12-22 DIAGNOSIS — A4189 Other specified sepsis: Secondary | ICD-10-CM | POA: Diagnosis present

## 2021-12-22 DIAGNOSIS — Z6831 Body mass index (BMI) 31.0-31.9, adult: Secondary | ICD-10-CM | POA: Diagnosis not present

## 2021-12-22 DIAGNOSIS — A0839 Other viral enteritis: Secondary | ICD-10-CM | POA: Diagnosis present

## 2021-12-22 DIAGNOSIS — R0600 Dyspnea, unspecified: Secondary | ICD-10-CM | POA: Diagnosis not present

## 2021-12-22 DIAGNOSIS — I5031 Acute diastolic (congestive) heart failure: Secondary | ICD-10-CM | POA: Diagnosis not present

## 2021-12-22 DIAGNOSIS — K219 Gastro-esophageal reflux disease without esophagitis: Secondary | ICD-10-CM | POA: Diagnosis present

## 2021-12-22 DIAGNOSIS — E1122 Type 2 diabetes mellitus with diabetic chronic kidney disease: Secondary | ICD-10-CM | POA: Diagnosis present

## 2021-12-22 DIAGNOSIS — N189 Chronic kidney disease, unspecified: Secondary | ICD-10-CM

## 2021-12-22 DIAGNOSIS — N179 Acute kidney failure, unspecified: Secondary | ICD-10-CM

## 2021-12-22 DIAGNOSIS — R918 Other nonspecific abnormal finding of lung field: Secondary | ICD-10-CM | POA: Diagnosis not present

## 2021-12-22 DIAGNOSIS — N184 Chronic kidney disease, stage 4 (severe): Secondary | ICD-10-CM | POA: Diagnosis present

## 2021-12-22 DIAGNOSIS — Z794 Long term (current) use of insulin: Secondary | ICD-10-CM | POA: Diagnosis not present

## 2021-12-22 DIAGNOSIS — E785 Hyperlipidemia, unspecified: Secondary | ICD-10-CM | POA: Diagnosis present

## 2021-12-22 DIAGNOSIS — K5730 Diverticulosis of large intestine without perforation or abscess without bleeding: Secondary | ICD-10-CM | POA: Diagnosis not present

## 2021-12-22 DIAGNOSIS — U071 COVID-19: Secondary | ICD-10-CM | POA: Diagnosis present

## 2021-12-22 DIAGNOSIS — A419 Sepsis, unspecified organism: Secondary | ICD-10-CM | POA: Diagnosis not present

## 2021-12-22 DIAGNOSIS — R6520 Severe sepsis without septic shock: Secondary | ICD-10-CM

## 2021-12-22 DIAGNOSIS — E860 Dehydration: Secondary | ICD-10-CM | POA: Diagnosis present

## 2021-12-22 DIAGNOSIS — E669 Obesity, unspecified: Secondary | ICD-10-CM | POA: Diagnosis present

## 2021-12-22 DIAGNOSIS — Z801 Family history of malignant neoplasm of trachea, bronchus and lung: Secondary | ICD-10-CM | POA: Diagnosis not present

## 2021-12-22 DIAGNOSIS — E11319 Type 2 diabetes mellitus with unspecified diabetic retinopathy without macular edema: Secondary | ICD-10-CM | POA: Diagnosis present

## 2021-12-22 DIAGNOSIS — E872 Acidosis, unspecified: Secondary | ICD-10-CM | POA: Diagnosis present

## 2021-12-22 DIAGNOSIS — R059 Cough, unspecified: Secondary | ICD-10-CM | POA: Diagnosis not present

## 2021-12-22 DIAGNOSIS — Z853 Personal history of malignant neoplasm of breast: Secondary | ICD-10-CM | POA: Diagnosis not present

## 2021-12-22 DIAGNOSIS — D649 Anemia, unspecified: Secondary | ICD-10-CM | POA: Diagnosis present

## 2021-12-22 DIAGNOSIS — Z800 Family history of malignant neoplasm of digestive organs: Secondary | ICD-10-CM | POA: Diagnosis not present

## 2021-12-22 DIAGNOSIS — I5033 Acute on chronic diastolic (congestive) heart failure: Secondary | ICD-10-CM | POA: Diagnosis not present

## 2021-12-22 DIAGNOSIS — K5790 Diverticulosis of intestine, part unspecified, without perforation or abscess without bleeding: Secondary | ICD-10-CM | POA: Diagnosis present

## 2021-12-22 DIAGNOSIS — I130 Hypertensive heart and chronic kidney disease with heart failure and stage 1 through stage 4 chronic kidney disease, or unspecified chronic kidney disease: Secondary | ICD-10-CM | POA: Diagnosis present

## 2021-12-22 DIAGNOSIS — D631 Anemia in chronic kidney disease: Secondary | ICD-10-CM | POA: Diagnosis present

## 2021-12-22 LAB — BLOOD GAS, VENOUS
Acid-base deficit: 1.5 mmol/L (ref 0.0–2.0)
Bicarbonate: 23.7 mmol/L (ref 20.0–28.0)
Drawn by: 5527
O2 Saturation: 76.6 %
Patient temperature: 37
pCO2, Ven: 41 mmHg — ABNORMAL LOW (ref 44–60)
pH, Ven: 7.37 (ref 7.25–7.43)
pO2, Ven: 49 mmHg — ABNORMAL HIGH (ref 32–45)

## 2021-12-22 LAB — CORTISOL-AM, BLOOD: Cortisol - AM: 14.7 ug/dL (ref 6.7–22.6)

## 2021-12-22 LAB — TYPE AND SCREEN
ABO/RH(D): A POS
Antibody Screen: NEGATIVE

## 2021-12-22 LAB — CBC
HCT: 27.1 % — ABNORMAL LOW (ref 36.0–46.0)
Hemoglobin: 8.3 g/dL — ABNORMAL LOW (ref 12.0–15.0)
MCH: 27.6 pg (ref 26.0–34.0)
MCHC: 30.6 g/dL (ref 30.0–36.0)
MCV: 90 fL (ref 80.0–100.0)
Platelets: 145 10*3/uL — ABNORMAL LOW (ref 150–400)
RBC: 3.01 MIL/uL — ABNORMAL LOW (ref 3.87–5.11)
RDW: 16.1 % — ABNORMAL HIGH (ref 11.5–15.5)
WBC: 6.6 10*3/uL (ref 4.0–10.5)
nRBC: 0 % (ref 0.0–0.2)

## 2021-12-22 LAB — PROCALCITONIN: Procalcitonin: 0.26 ng/mL

## 2021-12-22 LAB — CREATININE, SERUM
Creatinine, Ser: 1.87 mg/dL — ABNORMAL HIGH (ref 0.44–1.00)
GFR, Estimated: 28 mL/min — ABNORMAL LOW (ref >60)

## 2021-12-22 LAB — CBG MONITORING, ED
Glucose-Capillary: 284 mg/dL — ABNORMAL HIGH (ref 70–99)
Glucose-Capillary: 324 mg/dL — ABNORMAL HIGH (ref 70–99)
Glucose-Capillary: 267 mg/dL — ABNORMAL HIGH (ref 70–99)

## 2021-12-22 LAB — BRAIN NATRIURETIC PEPTIDE: B Natriuretic Peptide: 146 pg/mL — ABNORMAL HIGH (ref 0.0–100.0)

## 2021-12-22 LAB — GLUCOSE, CAPILLARY
Glucose-Capillary: 240 mg/dL — ABNORMAL HIGH (ref 70–99)
Glucose-Capillary: 194 mg/dL — ABNORMAL HIGH (ref 70–99)
Glucose-Capillary: 229 mg/dL — ABNORMAL HIGH (ref 70–99)

## 2021-12-22 LAB — FERRITIN: Ferritin: 169 ng/mL (ref 11–307)

## 2021-12-22 LAB — TROPONIN I (HIGH SENSITIVITY): Troponin I (High Sensitivity): 17 ng/L (ref <18)

## 2021-12-22 LAB — C-REACTIVE PROTEIN: CRP: 8.3 mg/dL — ABNORMAL HIGH (ref <1.0)

## 2021-12-22 LAB — CORTISOL: Cortisol, Plasma: 14.7 ug/dL

## 2021-12-22 LAB — LACTIC ACID, PLASMA: Lactic Acid, Venous: 1.9 mmol/L (ref 0.5–1.9)

## 2021-12-22 LAB — D-DIMER, QUANTITATIVE: D-Dimer, Quant: 1.27 ug/mL-FEU — ABNORMAL HIGH (ref 0.00–0.50)

## 2021-12-22 MED ORDER — SODIUM CHLORIDE 0.9% FLUSH: 3.0000 mL | INTRAVENOUS | Status: DC | PRN

## 2021-12-22 MED ORDER — VITAMIN C 500 MG PO TABS
500.0000 mg | ORAL_TABLET | Freq: Every day | ORAL | Status: DC
  Administered 2021-12-22 – 2021-12-27 (×6): 500 mg via ORAL
  Filled 2021-12-22 (×6): qty 1

## 2021-12-22 MED ORDER — INSULIN ASPART 100 UNIT/ML IJ SOLN
0.0000 [IU] | Freq: Three times a day (TID) | INTRAMUSCULAR | Status: DC
  Administered 2021-12-23: 5 [IU] via SUBCUTANEOUS
  Administered 2021-12-23: 2 [IU] via SUBCUTANEOUS
  Administered 2021-12-23: 8 [IU] via SUBCUTANEOUS
  Administered 2021-12-24: 3 [IU] via SUBCUTANEOUS
  Administered 2021-12-24: 5 [IU] via SUBCUTANEOUS
  Administered 2021-12-24: 8 [IU] via SUBCUTANEOUS
  Administered 2021-12-25: 5 [IU] via SUBCUTANEOUS
  Administered 2021-12-25 (×2): 3 [IU] via SUBCUTANEOUS
  Administered 2021-12-26: 11 [IU] via SUBCUTANEOUS
  Administered 2021-12-26: 3 [IU] via SUBCUTANEOUS
  Administered 2021-12-27: 2 [IU] via SUBCUTANEOUS
  Administered 2021-12-27: 5 [IU] via SUBCUTANEOUS
  Filled 2021-12-22 (×13): qty 1

## 2021-12-22 MED ORDER — ZINC SULFATE 220 (50 ZN) MG PO CAPS
220.0000 mg | ORAL_CAPSULE | Freq: Every day | ORAL | Status: DC
  Administered 2021-12-22 – 2021-12-27 (×6): 220 mg via ORAL
  Filled 2021-12-22 (×6): qty 1

## 2021-12-22 MED ORDER — SODIUM CHLORIDE 0.9% FLUSH
3.0000 mL | Freq: Two times a day (BID) | INTRAVENOUS | Status: DC
Start: 2021-12-22 — End: 2021-12-27
  Administered 2021-12-22 – 2021-12-27 (×8): 3 mL via INTRAVENOUS

## 2021-12-22 MED ORDER — POLYETHYLENE GLYCOL 3350 17 G PO PACK
17.0000 g | PACK | Freq: Every day | ORAL | Status: DC | PRN
Start: 2021-12-22 — End: 2021-12-27

## 2021-12-22 MED ORDER — HYDROCOD POLI-CHLORPHE POLI ER 10-8 MG/5ML PO SUER
5.0000 mL | Freq: Two times a day (BID) | ORAL | Status: DC | PRN
  Administered 2021-12-22 – 2021-12-27 (×4): 5 mL via ORAL
  Filled 2021-12-22 (×4): qty 5

## 2021-12-22 MED ORDER — PREDNISONE 50 MG PO TABS
50.0000 mg | ORAL_TABLET | Freq: Every day | ORAL | Status: DC
  Administered 2021-12-25 – 2021-12-27 (×3): 50 mg via ORAL
  Filled 2021-12-22 (×3): qty 1

## 2021-12-22 MED ORDER — SODIUM CHLORIDE 0.9% FLUSH
3.0000 mL | Freq: Two times a day (BID) | INTRAVENOUS | Status: DC
  Administered 2021-12-22 – 2021-12-27 (×10): 3 mL via INTRAVENOUS

## 2021-12-22 MED ORDER — DOCUSATE SODIUM 100 MG PO CAPS
100.0000 mg | ORAL_CAPSULE | Freq: Two times a day (BID) | ORAL | Status: DC
Start: 2021-12-22 — End: 2021-12-27
  Administered 2021-12-22 – 2021-12-26 (×2): 100 mg via ORAL
  Filled 2021-12-22 (×9): qty 1

## 2021-12-22 MED ORDER — HEPARIN SODIUM (PORCINE) 5000 UNIT/ML IJ SOLN
5000.0000 [IU] | Freq: Three times a day (TID) | INTRAMUSCULAR | Status: DC
  Administered 2021-12-22 – 2021-12-27 (×17): 5000 [IU] via SUBCUTANEOUS
  Filled 2021-12-22 (×17): qty 1

## 2021-12-22 MED ORDER — ACETAMINOPHEN 325 MG PO TABS
650.0000 mg | ORAL_TABLET | Freq: Four times a day (QID) | ORAL | Status: DC | PRN
  Administered 2021-12-24 (×2): 650 mg via ORAL
  Filled 2021-12-22 (×2): qty 2

## 2021-12-22 MED ORDER — ONDANSETRON HCL 4 MG/2ML IJ SOLN
4.0000 mg | Freq: Four times a day (QID) | INTRAMUSCULAR | Status: DC | PRN
  Administered 2021-12-24 – 2021-12-27 (×2): 4 mg via INTRAVENOUS
  Filled 2021-12-22 (×2): qty 2

## 2021-12-22 MED ORDER — COSYNTROPIN 0.25 MG IJ SOLR
0.2500 mg | Freq: Once | INTRAMUSCULAR | Status: AC
  Administered 2021-12-23: 0.25 mg via INTRAVENOUS
  Filled 2021-12-22 (×2): qty 0.25

## 2021-12-22 MED ORDER — ASPIRIN 81 MG PO TBEC
81.0000 mg | DELAYED_RELEASE_TABLET | Freq: Every day | ORAL | Status: DC
  Administered 2021-12-22 – 2021-12-27 (×6): 81 mg via ORAL
  Filled 2021-12-22 (×6): qty 1

## 2021-12-22 MED ORDER — SODIUM CHLORIDE 0.9 % IV SOLN
100.0000 mg | Freq: Every day | INTRAVENOUS | Status: AC
  Administered 2021-12-23 – 2021-12-24 (×2): 100 mg via INTRAVENOUS
  Filled 2021-12-22 (×2): qty 20

## 2021-12-22 MED ORDER — METHYLPREDNISOLONE SODIUM SUCC 125 MG IJ SOLR
0.5000 mg/kg | Freq: Two times a day (BID) | INTRAMUSCULAR | Status: AC
  Administered 2021-12-22 – 2021-12-24 (×6): 41.875 mg via INTRAVENOUS
  Filled 2021-12-22 (×6): qty 2

## 2021-12-22 MED ORDER — FLEET ENEMA 7-19 GM/118ML RE ENEM: 1.0000 | ENEMA | Freq: Once | RECTAL | Status: DC | PRN

## 2021-12-22 MED ORDER — OXYCODONE HCL 5 MG PO TABS
5.0000 mg | ORAL_TABLET | ORAL | Status: DC | PRN
  Administered 2021-12-23 – 2021-12-24 (×5): 5 mg via ORAL
  Filled 2021-12-22 (×6): qty 1

## 2021-12-22 MED ORDER — TAMOXIFEN CITRATE 10 MG PO TABS
20.0000 mg | ORAL_TABLET | Freq: Every day | ORAL | Status: DC
  Administered 2021-12-22 – 2021-12-27 (×6): 20 mg via ORAL
  Filled 2021-12-22 (×7): qty 2

## 2021-12-22 MED ORDER — SODIUM CHLORIDE 0.9 % IV SOLN
200.0000 mg | Freq: Once | INTRAVENOUS | Status: AC
  Administered 2021-12-22: 200 mg via INTRAVENOUS
  Filled 2021-12-22: qty 40

## 2021-12-22 MED ORDER — LOPERAMIDE HCL 2 MG PO CAPS
2.0000 mg | ORAL_CAPSULE | ORAL | Status: DC | PRN
  Administered 2021-12-22 – 2021-12-27 (×9): 2 mg via ORAL
  Filled 2021-12-22 (×10): qty 1

## 2021-12-22 MED ORDER — SODIUM CHLORIDE 0.9 % IV SOLN: INTRAVENOUS | Status: AC | PRN

## 2021-12-22 MED ORDER — GUAIFENESIN-DM 100-10 MG/5ML PO SYRP
10.0000 mL | ORAL_SOLUTION | ORAL | Status: DC | PRN
  Administered 2021-12-23 – 2021-12-27 (×14): 10 mL via ORAL
  Filled 2021-12-22 (×14): qty 10

## 2021-12-22 MED ORDER — ONDANSETRON HCL 4 MG PO TABS
4.0000 mg | ORAL_TABLET | Freq: Four times a day (QID) | ORAL | Status: DC | PRN
  Administered 2021-12-26: 4 mg via ORAL
  Filled 2021-12-22: qty 1

## 2021-12-22 MED ORDER — INSULIN ASPART 100 UNIT/ML IJ SOLN
0.0000 [IU] | Freq: Every day | INTRAMUSCULAR | Status: DC
  Administered 2021-12-22: 3 [IU] via SUBCUTANEOUS
  Filled 2021-12-22: qty 1

## 2021-12-22 MED ORDER — INSULIN ASPART 100 UNIT/ML IJ SOLN
0.0000 [IU] | Freq: Three times a day (TID) | INTRAMUSCULAR | Status: DC
  Administered 2021-12-22: 5 [IU] via SUBCUTANEOUS
  Administered 2021-12-22: 7 [IU] via SUBCUTANEOUS
  Filled 2021-12-22 (×2): qty 1

## 2021-12-22 MED ORDER — BISACODYL 5 MG PO TBEC: 5.0000 mg | DELAYED_RELEASE_TABLET | Freq: Every day | ORAL | Status: DC | PRN

## 2021-12-22 MED ORDER — ALBUTEROL SULFATE HFA 108 (90 BASE) MCG/ACT IN AERS
2.0000 | INHALATION_SPRAY | RESPIRATORY_TRACT | Status: DC | PRN
  Administered 2021-12-26 – 2021-12-27 (×3): 2 via RESPIRATORY_TRACT
  Filled 2021-12-22: qty 6.7

## 2021-12-22 NOTE — Assessment & Plan Note (Signed)
Anemia is chronic see trend below.  We will follow / type and screen.  Additional eval if hemoglobin drops per primary attending.

## 2021-12-22 NOTE — Assessment & Plan Note (Signed)
IV PPI. 

## 2021-12-22 NOTE — Progress Notes (Signed)
[facility] Progress Note  Patient: Kara [name]    IRW:[id number]  DOA: 12/21/2021    Date of Service: the patient was seen and examined on 12/22/2021  Brief hospital course: 76 year old female with past medical history of diabetes mellitus type 2, stage IV chronic kidney disease, breast cancer and obesity who presented to the emergency room on 9/1 with shortness of breath and cough for several weeks and patient found to be hypoxic requiring 2 L nasal cannula and found to have severe sepsis from COVID-pneumonia.  Patient started on IV steroids, antibiotics and IV Remdisivir.  Assessment and Plan: Assessment and Plan: * Severe sepsis ([place]) Pt meets severe sepsis criteria on admission, given tachypnea, tachycardia and pneumonia source plus lactic acidosis and acute kidney injury with creatinine greater than 2.  Continue Remdisivir, IV fluids as well as IV antibiotics given elevated procalcitonin.  Acute respiratory failure with hypoxia (HCC) Supplemental oxygen as needed to keep goal o2 sats above 94%.  Improved, currently 99% on 2 L.  We will attempt to wean in the next 24 hours   Pneumonia due to COVID-19 virus Will discuss with pharmacy for plan and order for Remdesivir and steroids for pt.  Covid isolation with droplet and airborne.  Daily covid labs.  ? Long covid as her symptoms have been going for several weeks.  Will obtain cta to identify vte as pt is hypoxic and risk os vte is high with covid and her autoimmune condition.  Acute kidney injury superimposed on stage IV chronic kidney disease ([place]) Secondary to diarrhea and dehydration.  IV fluids.  Recheck labs in the morning.  Diarrhea due to COVID-19 Started following admission.  Patient complains of significant diarrhea and abdominal discomfort.  Likely from [place].  Have started Imodium.  Essential hypertension Blood pressure (!) 105/45, pulse 91, temperature 98.4 F (36.9 C), temperature source Oral, resp. rate (!) 21,  height '5\' 4"'$  (1.626 m), weight 83.9 kg, SpO2 96 %. Vitals:   12/21/21 2129 12/21/21 2200 12/21/21 2253 12/21/21 2330  BP: (!) 148/116 (!) 112/37 (!) 122/46 (!) 119/45   12/22/21 0030 12/22/21 0130  BP: (!) 111/44 (!) 105/45  We will hold diuretic and antihtn meds.  Gentle MIVF hydration. Encourage po fluids.     Generalized weakness supportive care and pt and STR on d/c.   Anemia Anemia is chronic see trend below.  We will follow / type and screen.  Additional eval if hemoglobin drops per primary attending.  DM II (diabetes mellitus, type II), controlled ([place]) Glycemic protocol.    GERD (gastroesophageal reflux disease) IV PPI.   Obesity (BMI 30-39.9) Needs.  BMI greater than 30       Body mass index is 31.76 kg/m.        Consultants: None  Procedures: None  Antimicrobials: IV Remdisivir 9/2-present IV Rocephin and Zithromax 9/1-present  Code Status: Full code   Subjective: Patient complains of abdominal discomfort and diarrhea  Objective: Vital signs were reviewed and unremarkable. Vitals:   12/22/21 1204 12/22/21 1555  BP: (!) 127/57 (!) 137/54  Pulse: 93 74  Resp:  18  Temp:  98 F (36.7 C)  SpO2:  99%    Intake/Output Summary (Last 24 hours) at 12/22/2021 1738 Last data filed at 12/22/2021 0023 Gross per 24 hour  Intake 3350 ml  Output --  Net 3350 ml   Filed Weights   12/21/21 2128  Weight: 83.9 kg   Body mass index is 31.76 kg/m.  Exam:  General: Alert and oriented x3, mild distress HEENT: Normocephalic and atraumatic, mucous membranes slightly dry Cardiovascular: Regular rate and rhythm, S1-S2 Respiratory: Clear to auscultation bilaterally Abdomen: Soft, nonspecific mild tenderness in the lower abdominal quadrants with hyperactive bowel sounds Musculoskeletal: No clubbing or cyanosis or edema Skin: No skin breaks, tears or lesions Psychiatry: Appropriate, no evidence of psychoses Neurology: No focal deficits  Data  Reviewed: Noted improvement in lactic acid  Disposition:  Status is: Inpatient Remains inpatient appropriate because: Treatment of infection, improvement in diarrhea and hypoxia    Anticipated discharge date: 9/5  Family Communication: Patient declined for me to call her husband who has dementia DVT Prophylaxis: heparin injection 5,000 Units Start: 12/22/21 0600    Author: Annita Brod ,MD 12/22/2021 5:38 PM  To reach On-call, see care teams to locate the attending and reach out via [email or web address]. Between 7PM-7AM, please contact night-coverage If you still have difficulty reaching the attending provider, please page the [facility] (Director on Call) for [facility] on amion for assistance.

## 2021-12-22 NOTE — Assessment & Plan Note (Signed)
Blood pressures have been trending upward, likely in part may be due to fluid retention from heart failure.  As above.

## 2021-12-22 NOTE — Assessment & Plan Note (Signed)
Cough x few weeks . Attribute to covid infection and PNA. Antitussives and antiviral regimen as pt is hypoxic.

## 2021-12-22 NOTE — Assessment & Plan Note (Signed)
Glycemic protocol.   

## 2021-12-22 NOTE — Assessment & Plan Note (Addendum)
Supplemental oxygen as needed to keep goal o2 sats above 94%.  Improved, currently 95% on 3 L, however with ambulation, O2 sats dropped below 90% on 3 L.  Some of this is related to some mild acute diastolic heart failure.

## 2021-12-22 NOTE — Assessment & Plan Note (Signed)
Needs.  BMI greater than 30

## 2021-12-22 NOTE — Assessment & Plan Note (Addendum)
Will discuss with pharmacy for plan and order for Remdesivir and steroids for pt.  Covid isolation with droplet and airborne.  Daily covid labs.  ? Long covid as her symptoms have been going for several weeks.  Will obtain cta to identify vte as pt is hypoxic and risk os vte is high with covid and her autoimmune condition.

## 2021-12-22 NOTE — Hospital Course (Addendum)
76 year old female with past medical history of diabetes mellitus type 2, stage IV chronic kidney disease, breast cancer and obesity who presented to the emergency room on 9/1 with shortness of breath and cough for several weeks and patient found to be hypoxic requiring 2 L nasal cannula and found to have severe sepsis from COVID-pneumonia.  Patient started on IV steroids, antibiotics and IV Remdisivir.

## 2021-12-22 NOTE — Assessment & Plan Note (Signed)
Steadily improving.Secondary to diarrhea and dehydration.  We will continue to follow, given that we are stopping her fluids and starting some Lasix.

## 2021-12-22 NOTE — Assessment & Plan Note (Addendum)
Seen by PT and OT and patient quite deconditioned as well as easily dyspneic.  For now they are recommending short-term rehab however, condition may improve with continued physical therapy.

## 2021-12-22 NOTE — ED Notes (Signed)
Patient up to toilet assistance with 1 person assist.

## 2021-12-22 NOTE — Assessment & Plan Note (Addendum)
Pt meets severe sepsis criteria on admission, given tachypnea, tachycardia and pneumonia source plus lactic acidosis and acute kidney injury with creatinine greater than 2.  Continue Remdisivir, IV fluids as well as IV antibiotics given elevated procalcitonin.

## 2021-12-22 NOTE — Plan of Care (Signed)
  Problem: Coping: Goal: Psychosocial and spiritual needs will be supported Outcome: Progressing   

## 2021-12-22 NOTE — Assessment & Plan Note (Signed)
Started following admission.  Patient complains of significant diarrhea and abdominal discomfort.  Likely from [place].  Have started Imodium.

## 2021-12-23 DIAGNOSIS — J9601 Acute respiratory failure with hypoxia: Secondary | ICD-10-CM | POA: Diagnosis not present

## 2021-12-23 DIAGNOSIS — U071 COVID-19: Secondary | ICD-10-CM | POA: Diagnosis not present

## 2021-12-23 DIAGNOSIS — N179 Acute kidney failure, unspecified: Secondary | ICD-10-CM | POA: Diagnosis not present

## 2021-12-23 DIAGNOSIS — A419 Sepsis, unspecified organism: Secondary | ICD-10-CM | POA: Diagnosis not present

## 2021-12-23 DIAGNOSIS — K5730 Diverticulosis of large intestine without perforation or abscess without bleeding: Secondary | ICD-10-CM | POA: Diagnosis present

## 2021-12-23 LAB — COMPREHENSIVE METABOLIC PANEL
ALT: 23 U/L (ref 0–44)
AST: 33 U/L (ref 15–41)
Albumin: 2.3 g/dL — ABNORMAL LOW (ref 3.5–5.0)
Alkaline Phosphatase: 65 U/L (ref 38–126)
Anion gap: 7 (ref 5–15)
BUN: 32 mg/dL — ABNORMAL HIGH (ref 8–23)
CO2: 24 mmol/L (ref 22–32)
Calcium: 8.6 mg/dL — ABNORMAL LOW (ref 8.9–10.3)
Chloride: 108 mmol/L (ref 98–111)
Creatinine, Ser: 1.61 mg/dL — ABNORMAL HIGH (ref 0.44–1.00)
GFR, Estimated: 33 mL/min — ABNORMAL LOW (ref >60)
Glucose, Bld: 196 mg/dL — ABNORMAL HIGH (ref 70–99)
Potassium: 3.9 mmol/L (ref 3.5–5.1)
Sodium: 139 mmol/L (ref 135–145)
Total Bilirubin: 0.7 mg/dL (ref 0.3–1.2)
Total Protein: 6.2 g/dL — ABNORMAL LOW (ref 6.5–8.1)

## 2021-12-23 LAB — MAGNESIUM: Magnesium: 1.7 mg/dL (ref 1.7–2.4)

## 2021-12-23 LAB — GLUCOSE, CAPILLARY
Glucose-Capillary: 208 mg/dL — ABNORMAL HIGH (ref 70–99)
Glucose-Capillary: 147 mg/dL — ABNORMAL HIGH (ref 70–99)
Glucose-Capillary: 176 mg/dL — ABNORMAL HIGH (ref 70–99)

## 2021-12-23 LAB — CBC WITH DIFFERENTIAL/PLATELET
Abs Immature Granulocytes: 0.07 10*3/uL (ref 0.00–0.07)
Basophils Absolute: 0 10*3/uL (ref 0.0–0.1)
Basophils Relative: 0 %
Eosinophils Absolute: 0 10*3/uL (ref 0.0–0.5)
Eosinophils Relative: 0 %
HCT: 29.2 % — ABNORMAL LOW (ref 36.0–46.0)
Hemoglobin: 9.3 g/dL — ABNORMAL LOW (ref 12.0–15.0)
Immature Granulocytes: 1 %
Lymphocytes Relative: 5 %
Lymphs Abs: 0.6 10*3/uL — ABNORMAL LOW (ref 0.7–4.0)
MCH: 27.8 pg (ref 26.0–34.0)
MCHC: 31.8 g/dL (ref 30.0–36.0)
MCV: 87.2 fL (ref 80.0–100.0)
Monocytes Absolute: 0.7 10*3/uL (ref 0.1–1.0)
Monocytes Relative: 5 %
Neutro Abs: 12.1 10*3/uL — ABNORMAL HIGH (ref 1.7–7.7)
Neutrophils Relative %: 89 %
Platelets: 181 10*3/uL (ref 150–400)
RBC: 3.35 MIL/uL — ABNORMAL LOW (ref 3.87–5.11)
RDW: 15.9 % — ABNORMAL HIGH (ref 11.5–15.5)
WBC: 13.5 10*3/uL — ABNORMAL HIGH (ref 4.0–10.5)
nRBC: 0 % (ref 0.0–0.2)

## 2021-12-23 LAB — PHOSPHORUS: Phosphorus: 3.3 mg/dL (ref 2.5–4.6)

## 2021-12-23 LAB — LACTIC ACID, PLASMA: Lactic Acid, Venous: 0.9 mmol/L (ref 0.5–1.9)

## 2021-12-23 LAB — D-DIMER, QUANTITATIVE: D-Dimer, Quant: 1.4 ug/mL-FEU — ABNORMAL HIGH (ref 0.00–0.50)

## 2021-12-23 LAB — ACTH STIMULATION, 3 TIME POINTS
Cortisol, 30 Min: 19.3 ug/dL
Cortisol, 60 Min: 21.7 ug/dL
Cortisol, Base: 4.1 ug/dL

## 2021-12-23 LAB — FERRITIN: Ferritin: 210 ng/mL (ref 11–307)

## 2021-12-23 LAB — C-REACTIVE PROTEIN: CRP: 6.1 mg/dL — ABNORMAL HIGH (ref <1.0)

## 2021-12-23 LAB — PROCALCITONIN: Procalcitonin: 0.23 ng/mL

## 2021-12-23 MED ORDER — SODIUM CHLORIDE 0.9 % IV SOLN: INTRAVENOUS | Status: DC

## 2021-12-23 NOTE — Assessment & Plan Note (Signed)
Following severe diarrhea, CT of abdomen/pelvis done noting severe sigmoid diverticulosis with tethering of proximal and distal sigmoid colon within the left lower quadrant which may reflect postinflammatory changes from remote diverticulitis.  No inflammatory stranding or fluid collections identified.  Diarrhea reaffirmed to be from [place].

## 2021-12-23 NOTE — Plan of Care (Signed)
  Problem: Education: Goal: Ability to describe self-care measures that may prevent or decrease complications (Diabetes Survival Skills Education) will improve Outcome: Progressing Goal: Individualized Educational Video(s) Outcome: Progressing   Problem: Coping: Goal: Ability to adjust to condition or change in health will improve Outcome: Progressing   Problem: Fluid Volume: Goal: Ability to maintain a balanced intake and output will improve Outcome: Progressing   Problem: Health Behavior/Discharge Planning: Goal: Ability to identify and utilize available resources and services will improve Outcome: Progressing Goal: Ability to manage health-related needs will improve Outcome: Progressing   Problem: Metabolic: Goal: Ability to maintain appropriate glucose levels will improve Outcome: Progressing   Problem: Nutritional: Goal: Maintenance of adequate nutrition will improve Outcome: Progressing Goal: Progress toward achieving an optimal weight will improve Outcome: Progressing   Problem: Skin Integrity: Goal: Risk for impaired skin integrity will decrease Outcome: Progressing   Problem: Tissue Perfusion: Goal: Adequacy of tissue perfusion will improve Outcome: Progressing   Problem: Education: Goal: Knowledge of risk factors and measures for prevention of condition will improve Outcome: Progressing   Problem: Coping: Goal: Psychosocial and spiritual needs will be supported Outcome: Progressing   Problem: Respiratory: Goal: Will maintain a patent airway Outcome: Progressing Goal: Complications related to the disease process, condition or treatment will be avoided or minimized Outcome: Progressing   Problem: Education: Goal: Knowledge of General Education information will improve Description: Including pain rating scale, medication(s)/side effects and non-pharmacologic comfort measures Outcome: Progressing   Problem: Health Behavior/Discharge Planning: Goal:  Ability to manage health-related needs will improve Outcome: Progressing   Problem: Clinical Measurements: Goal: Ability to maintain clinical measurements within normal limits will improve Outcome: Progressing Goal: Will remain free from infection Outcome: Progressing Goal: Diagnostic test results will improve Outcome: Progressing Goal: Respiratory complications will improve Outcome: Progressing Goal: Cardiovascular complication will be avoided Outcome: Progressing   Problem: Activity: Goal: Risk for activity intolerance will decrease Outcome: Progressing   Problem: Nutrition: Goal: Adequate nutrition will be maintained Outcome: Progressing   Problem: Coping: Goal: Level of anxiety will decrease Outcome: Progressing   Problem: Elimination: Goal: Will not experience complications related to bowel motility Outcome: Progressing Goal: Will not experience complications related to urinary retention Outcome: Progressing   Problem: Pain Managment: Goal: General experience of comfort will improve Outcome: Progressing   Problem: Safety: Goal: Ability to remain free from injury will improve Outcome: Progressing   Problem: Skin Integrity: Goal: Risk for impaired skin integrity will decrease Outcome: Progressing   

## 2021-12-23 NOTE — Plan of Care (Signed)
  Problem: Health Behavior/Discharge Planning: Goal: Ability to manage health-related needs will improve Outcome: Progressing   Problem: Nutritional: Goal: Maintenance of adequate nutrition will improve Outcome: Progressing   Problem: Education: Goal: Knowledge of risk factors and measures for prevention of condition will improve Outcome: Progressing   Problem: Respiratory: Goal: Will maintain a patent airway Outcome: Progressing   Problem: Health Behavior/Discharge Planning: Goal: Ability to manage health-related needs will improve Outcome: Progressing   Problem: Clinical Measurements: Goal: Diagnostic test results will improve Outcome: Progressing Goal: Respiratory complications will improve Outcome: Progressing Goal: Cardiovascular complication will be avoided Outcome: Progressing   Problem: Activity: Goal: Risk for activity intolerance will decrease Outcome: Progressing   Problem: Coping: Goal: Level of anxiety will decrease Outcome: Progressing   Problem: Elimination: Goal: Will not experience complications related to bowel motility Outcome: Progressing Goal: Will not experience complications related to urinary retention Outcome: Progressing   Problem: Pain Managment: Goal: General experience of comfort will improve Outcome: Progressing

## 2021-12-23 NOTE — Progress Notes (Signed)
[facility] Progress Note  Patient: Kara [name]    OEV:[id number]  DOA: 12/21/2021    Date of Service: the patient was seen and examined on 12/23/2021  Brief hospital course: 76 year old female with past medical history of diabetes mellitus type 2, stage IV chronic kidney disease, breast cancer and obesity who presented to the emergency room on 9/1 with shortness of breath and cough for several weeks and patient found to be hypoxic requiring 2 L nasal cannula and found to have severe sepsis from COVID-pneumonia.  Patient started on IV steroids, antibiotics and IV Remdisivir.  Assessment and Plan: Assessment and Plan: * Severe sepsis ([place]) Pt meets severe sepsis criteria on admission, given tachypnea, tachycardia and pneumonia source plus lactic acidosis and acute kidney injury with creatinine greater than 2.  Continue Remdisivir, IV fluids as well as IV antibiotics given elevated procalcitonin.  Acute respiratory failure with hypoxia ([facility]) Supplemental oxygen as needed to keep goal o2 sats above 94%.  Improved, currently 99% on 2 L.    Pneumonia due to COVID-19 virus Continue Remdisivir and steroids.  CRP mild and continues to trend downward.   Acute kidney injury superimposed on stage IV chronic kidney disease ([place]) Steadily improving.Secondary to diarrhea and dehydration.  Continue IV fluids.  Recheck labs in the morning.  Diarrhea due to COVID-19 Started following admission.  Patient complains of significant diarrhea and abdominal discomfort.  CT notes diverticulosis only.  See below.  Diarrhea likely from COVID and responding to Imodium.  Essential hypertension Blood pressure (!) 105/45, pulse 91, temperature 98.4 F (36.9 C), temperature source Oral, resp. rate (!) 21, height '5\' 4"'$  (1.626 m), weight 83.9 kg, SpO2 96 %. Vitals:   12/21/21 2129 12/21/21 2200 12/21/21 2253 12/21/21 2330  BP: (!) 148/116 (!) 112/37 (!) 122/46 (!) 119/45   12/22/21 0030 12/22/21 0130  BP: (!)  111/44 (!) 105/45  We will hold diuretic and antihtn meds.  Gentle MIVF hydration. Encourage po fluids.     Diverticulosis large intestine w/o perforation or abscess w/o bleeding Following severe diarrhea, CT of abdomen/pelvis done noting severe sigmoid diverticulosis with tethering of proximal and distal sigmoid colon within the left lower quadrant which may reflect postinflammatory changes from remote diverticulitis.  No inflammatory stranding or fluid collections identified.  Diarrhea reaffirmed to be from [place].    Generalized weakness supportive care and pt and STR on d/c.   Anemia Anemia is chronic see trend below.  We will follow / type and screen.  Additional eval if hemoglobin drops per primary attending.  DM II (diabetes mellitus, type II), controlled ([place]) Glycemic protocol.    GERD (gastroesophageal reflux disease) IV PPI.   Obesity (BMI 30-39.9) Needs.  BMI greater than 30       Body mass index is 31.76 kg/m.        Consultants: None  Procedures: None  Antimicrobials: IV Remdisivir 9/2-present IV Rocephin and Zithromax 9/1-present  Code Status: Full code   Subjective: Patient still having a little abdominal discomfort as well as some diarrhea, but both have improved in the last 24 hours.  She feels dizzy when she stands.  Objective: Vital signs were reviewed and unremarkable. Vitals:   12/23/21 0452 12/23/21 0723  BP: (!) 131/56 (!) 143/56  Pulse: 75 85  Resp: 19 16  Temp: (!) 97.5 F (36.4 C) 98.5 F (36.9 C)  SpO2: 97% 96%   No intake or output data in the 24 hours ending 12/23/21 [place]  12/21/21 2128  Weight: 83.9 kg   Body mass index is 31.76 kg/m.  Exam:  General: Alert and oriented x3, mild distress HEENT: Normocephalic and atraumatic, mucous membranes slightly dry Cardiovascular: Regular rate and rhythm, S1-S2 Respiratory: Clear to auscultation bilaterally Abdomen: Soft, minimal tenderness, obese,  positive bowel sounds Musculoskeletal: No clubbing or cyanosis or edema Skin: No skin breaks, tears or lesions, ecchymoses over abdomen Psychiatry: Appropriate, no evidence of psychoses Neurology: No focal deficits  Data Reviewed: Improving renal function  Disposition:  Status is: Inpatient Remains inpatient appropriate because:  Treatment of infection, improvement in diarrhea and hypoxia and dizziness    Anticipated discharge date: 9/5  Family Communication: Patient declined for me to call her husband who has dementia DVT Prophylaxis: heparin injection 5,000 Units Start: 12/22/21 0600    Author: Annita Brod ,MD 12/23/2021 2:02 PM  To reach On-call, see care teams to locate the attending and reach out via [email or web address]. Between 7PM-7AM, please contact night-coverage If you still have difficulty reaching the attending provider, please page the [facility] (Director on Call) for [facility] on amion for assistance.

## 2021-12-24 DIAGNOSIS — U071 COVID-19: Secondary | ICD-10-CM | POA: Diagnosis not present

## 2021-12-24 DIAGNOSIS — N179 Acute kidney failure, unspecified: Secondary | ICD-10-CM | POA: Diagnosis not present

## 2021-12-24 DIAGNOSIS — E669 Obesity, unspecified: Secondary | ICD-10-CM | POA: Diagnosis not present

## 2021-12-24 DIAGNOSIS — A419 Sepsis, unspecified organism: Secondary | ICD-10-CM | POA: Diagnosis not present

## 2021-12-24 LAB — COMPREHENSIVE METABOLIC PANEL
ALT: 27 U/L (ref 0–44)
AST: 32 U/L (ref 15–41)
Albumin: 2.6 g/dL — ABNORMAL LOW (ref 3.5–5.0)
Alkaline Phosphatase: 72 U/L (ref 38–126)
Anion gap: 10 (ref 5–15)
BUN: 34 mg/dL — ABNORMAL HIGH (ref 8–23)
CO2: 22 mmol/L (ref 22–32)
Calcium: 8.8 mg/dL — ABNORMAL LOW (ref 8.9–10.3)
Chloride: 109 mmol/L (ref 98–111)
Creatinine, Ser: 1.53 mg/dL — ABNORMAL HIGH (ref 0.44–1.00)
GFR, Estimated: 35 mL/min — ABNORMAL LOW (ref >60)
Glucose, Bld: 255 mg/dL — ABNORMAL HIGH (ref 70–99)
Potassium: 4.2 mmol/L (ref 3.5–5.1)
Sodium: 141 mmol/L (ref 135–145)
Total Bilirubin: 0.8 mg/dL (ref 0.3–1.2)
Total Protein: 6.9 g/dL (ref 6.5–8.1)

## 2021-12-24 LAB — CBC WITH DIFFERENTIAL/PLATELET
Abs Immature Granulocytes: 0.07 10*3/uL (ref 0.00–0.07)
Basophils Absolute: 0 10*3/uL (ref 0.0–0.1)
Basophils Relative: 0 %
Eosinophils Absolute: 0 10*3/uL (ref 0.0–0.5)
Eosinophils Relative: 0 %
HCT: 35 % — ABNORMAL LOW (ref 36.0–46.0)
Hemoglobin: 10.9 g/dL — ABNORMAL LOW (ref 12.0–15.0)
Immature Granulocytes: 1 %
Lymphocytes Relative: 7 %
Lymphs Abs: 0.7 10*3/uL (ref 0.7–4.0)
MCH: 27 pg (ref 26.0–34.0)
MCHC: 31.1 g/dL (ref 30.0–36.0)
MCV: 86.8 fL (ref 80.0–100.0)
Monocytes Absolute: 0.3 10*3/uL (ref 0.1–1.0)
Monocytes Relative: 3 %
Neutro Abs: 9.3 10*3/uL — ABNORMAL HIGH (ref 1.7–7.7)
Neutrophils Relative %: 89 %
Platelets: 249 10*3/uL (ref 150–400)
RBC: 4.03 MIL/uL (ref 3.87–5.11)
RDW: 16 % — ABNORMAL HIGH (ref 11.5–15.5)
WBC: 10.4 10*3/uL (ref 4.0–10.5)
nRBC: 0 % (ref 0.0–0.2)

## 2021-12-24 LAB — D-DIMER, QUANTITATIVE: D-Dimer, Quant: 1.58 ug/mL-FEU — ABNORMAL HIGH (ref 0.00–0.50)

## 2021-12-24 LAB — GLUCOSE, CAPILLARY
Glucose-Capillary: 261 mg/dL — ABNORMAL HIGH (ref 70–99)
Glucose-Capillary: 225 mg/dL — ABNORMAL HIGH (ref 70–99)
Glucose-Capillary: 177 mg/dL — ABNORMAL HIGH (ref 70–99)
Glucose-Capillary: 156 mg/dL — ABNORMAL HIGH (ref 70–99)

## 2021-12-24 LAB — MAGNESIUM: Magnesium: 1.9 mg/dL (ref 1.7–2.4)

## 2021-12-24 LAB — FERRITIN: Ferritin: 234 ng/mL (ref 11–307)

## 2021-12-24 LAB — C-REACTIVE PROTEIN: CRP: 4.2 mg/dL — ABNORMAL HIGH (ref <1.0)

## 2021-12-24 LAB — PHOSPHORUS: Phosphorus: 3.4 mg/dL (ref 2.5–4.6)

## 2021-12-24 MED ORDER — SODIUM CHLORIDE 0.9 % IV SOLN
100.0000 mg | Freq: Every day | INTRAVENOUS | Status: AC
  Administered 2021-12-25 – 2021-12-26 (×2): 100 mg via INTRAVENOUS
  Filled 2021-12-24: qty 100
  Filled 2021-12-24: qty 20

## 2021-12-24 MED ORDER — STERILE WATER FOR INJECTION IJ SOLN
INTRAMUSCULAR | Status: AC
  Filled 2021-12-24: qty 10

## 2021-12-24 MED ORDER — PROCHLORPERAZINE EDISYLATE 10 MG/2ML IJ SOLN
10.0000 mg | INTRAMUSCULAR | Status: DC | PRN
Start: 2021-12-24 — End: 2021-12-27

## 2021-12-24 MED ORDER — FUROSEMIDE 10 MG/ML IJ SOLN
20.0000 mg | Freq: Two times a day (BID) | INTRAMUSCULAR | Status: DC
  Administered 2021-12-24 – 2021-12-27 (×6): 20 mg via INTRAVENOUS
  Filled 2021-12-24 (×6): qty 4

## 2021-12-24 NOTE — Plan of Care (Signed)
  Problem: Education: Goal: Ability to describe self-care measures that may prevent or decrease complications (Diabetes Survival Skills Education) will improve Outcome: Progressing Goal: Individualized Educational Video(s) Outcome: Progressing   Problem: Coping: Goal: Ability to adjust to condition or change in health will improve Outcome: Progressing   Problem: Fluid Volume: Goal: Ability to maintain a balanced intake and output will improve Outcome: Progressing   Problem: Health Behavior/Discharge Planning: Goal: Ability to identify and utilize available resources and services will improve Outcome: Progressing Goal: Ability to manage health-related needs will improve Outcome: Progressing   Problem: Metabolic: Goal: Ability to maintain appropriate glucose levels will improve Outcome: Progressing   Problem: Nutritional: Goal: Maintenance of adequate nutrition will improve Outcome: Progressing Goal: Progress toward achieving an optimal weight will improve Outcome: Progressing   Problem: Skin Integrity: Goal: Risk for impaired skin integrity will decrease Outcome: Progressing   Problem: Tissue Perfusion: Goal: Adequacy of tissue perfusion will improve Outcome: Progressing   Problem: Education: Goal: Knowledge of risk factors and measures for prevention of condition will improve Outcome: Progressing   Problem: Coping: Goal: Psychosocial and spiritual needs will be supported Outcome: Progressing   Problem: Respiratory: Goal: Will maintain a patent airway Outcome: Progressing Goal: Complications related to the disease process, condition or treatment will be avoided or minimized Outcome: Progressing   Problem: Education: Goal: Knowledge of General Education information will improve Description: Including pain rating scale, medication(s)/side effects and non-pharmacologic comfort measures Outcome: Progressing   Problem: Health Behavior/Discharge Planning: Goal:  Ability to manage health-related needs will improve Outcome: Progressing   Problem: Clinical Measurements: Goal: Ability to maintain clinical measurements within normal limits will improve Outcome: Progressing Goal: Will remain free from infection Outcome: Progressing Goal: Diagnostic test results will improve Outcome: Progressing Goal: Respiratory complications will improve Outcome: Progressing Goal: Cardiovascular complication will be avoided Outcome: Progressing   Problem: Activity: Goal: Risk for activity intolerance will decrease Outcome: Progressing   Problem: Nutrition: Goal: Adequate nutrition will be maintained Outcome: Progressing   Problem: Coping: Goal: Level of anxiety will decrease Outcome: Progressing   Problem: Elimination: Goal: Will not experience complications related to bowel motility Outcome: Progressing Goal: Will not experience complications related to urinary retention Outcome: Progressing   Problem: Pain Managment: Goal: General experience of comfort will improve Outcome: Progressing   Problem: Safety: Goal: Ability to remain free from injury will improve Outcome: Progressing   Problem: Skin Integrity: Goal: Risk for impaired skin integrity will decrease Outcome: Progressing   

## 2021-12-24 NOTE — Evaluation (Signed)
Physical Therapy Evaluation Patient Details Name: Kara [name] MRN: [id number] DOB: 1946/04/06 Today's Date: 12/24/2021  History of Present Illness  Kara [name] is a 76yoF who comes to [facility] after 2 weeks of cough, found to be (+) COVID 19. PTA pt was a limited community AMB with SPC, lives with husband, 2 falls in past 12 months.  Clinical Impression  Pt admitted with above Dx. Pt has functional limitations due to deficits below (see "PT Problem List"). Pt able to provide details on baseline functional status. Today pt requires no frank physical assistance to perform bed mobility, transfers, and short distance walking. Pt does require 3-4L/min supplemental O2 for mobility, sats dropping to 83% on room air while seated and resting. Pt's AMB is limited by easily provoked DOE. Pt educated and encouraged to began transitioning back to her normal routine of getting out of bed throughout the day for meal times and progressing her AMB distances for toileting. Discussed pros and cons of STR v home, Kara [name] believes pt will be able to safely be in home at present state with help from husband, likely will be more mobile than should she pursue rehab. Patient's performance this date reveals decreased ability, independence, and tolerance in performing all basic mobility required for performance of activities of daily living. Pt requires additional DME, close physical assistance, and cues for safe participate in mobility. Pt will benefit from skilled PT intervention to increase independence and safety with basic mobility in preparation for discharge to the venue listed below.     No data found.        Recommendations for follow up therapy are one component of a multi-disciplinary discharge planning process, led by the attending physician.  Recommendations may be updated based on patient status, additional functional criteria and insurance authorization.  Follow Up Recommendations Home health PT       Assistance Recommended at Discharge None  Patient can return home with the following  A little help with walking and/or transfers;A little help with bathing/dressing/bathroom;Assistance with cooking/housework;Help with stairs or ramp for entrance;Assist for transportation;Direct supervision/assist for medications management    Equipment Recommendations Rolling walker (2 wheels);BSC/3in1  Recommendations for Other Services       Functional Status Assessment Patient has had a recent decline in their functional status and demonstrates the ability to make significant improvements in function in a reasonable and predictable amount of time.     Precautions / Restrictions Precautions Precautions: Fall Restrictions Weight Bearing Restrictions: No      Mobility  Bed Mobility Overal bed mobility: Needs Assistance Bed Mobility: Sit to Supine       Sit to supine: Supervision        Transfers Overall transfer level: Needs assistance Equipment used: Rolling walker (2 wheels) Transfers: Sit to/from Stand Sit to Stand: Supervision                Ambulation/Gait Ambulation/Gait assistance: Min guard Gait Distance (Feet): 24 Feet Assistive device: Rolling walker (2 wheels) Gait Pattern/deviations: Step-to pattern       General Gait Details: AMB 12 feet at bedside, then takes a sit break 2/2 dyspnea; then 59f at bedside (on 4L/min with RW) SpO2 in 90s%  Stairs            Wheelchair Mobility    Modified Rankin (Stroke Patients Only)       Balance  Pertinent Vitals/Pain Pain Assessment Pain Assessment: No/denies pain    Home Living Family/patient expects to be discharged to:: Private residence Living Arrangements: Spouse/significant other Available Help at Discharge: Family;Available 24 hours/day Type of Home: House Home Access: Stairs to enter Entrance Stairs-Rails: None Entrance  Stairs-Number of Steps: 1  3-4" partial step   Home Layout: One level Home Equipment: Cane - single point;Grab bars - tub/shower Additional Comments: son nearby available PRN, husband at home 24/7 but has dementia    Prior Function Prior Level of Function : Needs assist             Mobility Comments: IND with no AD ADLs Comments: IND with ADLs, husband IADLs, both retired     Journalist, newspaper        Extremity/Trunk Assessment   Upper Extremity Assessment Upper Extremity Assessment: Generalized weakness    Lower Extremity Assessment Lower Extremity Assessment: Generalized weakness    Cervical / Trunk Assessment Cervical / Trunk Assessment: Normal  Communication   Communication: No difficulties  Cognition Arousal/Alertness: Awake/alert Behavior During Therapy: WFL for tasks assessed/performed Overall Cognitive Status: Within Functional Limits for tasks assessed                                          General Comments General comments (skin integrity, edema, etc.): Pt experiencing dyspnea with exertion on 3L O2 via [place], unable to get an accurate reading of SpO2 during activity due to poor pleth. Pt with c/o dizziness and returned to sitting to get vitals, BP 152/66 (92), HR 65, SpO2 90-96% on 3L. Pt educated on pursed lip breathing and energy conservation.    Exercises     Assessment/Plan    PT Assessment Patient needs continued PT services  PT Problem List Cardiopulmonary status limiting activity;Decreased mobility;Decreased balance;Decreased activity tolerance;Decreased range of motion;Decreased strength;Decreased knowledge of use of DME;Decreased safety awareness;Decreased knowledge of precautions       PT Treatment Interventions DME instruction;Balance training;Gait training;Stair training;Cognitive remediation;Functional mobility training;Patient/family education;Therapeutic activities;Therapeutic exercise    PT Goals (Current goals can be  found in the Care Plan section)  Acute Rehab PT Goals Patient Stated Goal: regain strength to return home, avoid further deconditioning PT Goal Formulation: With patient Time For Goal Achievement: 01/07/22 Potential to Achieve Goals: Good    Frequency Min 2X/week     Co-evaluation               AM-PAC PT "6 Clicks" Mobility  Outcome Measure Help needed turning from your back to your side while in a flat bed without using bedrails?: A Little Help needed moving from lying on your back to sitting on the side of a flat bed without using bedrails?: A Little Help needed moving to and from a bed to a chair (including a wheelchair)?: A Little Help needed standing up from a chair using your arms (e.g., wheelchair or bedside chair)?: A Little Help needed to walk in hospital room?: A Little Help needed climbing 3-5 steps with a railing? : A Lot 6 Click Score: 17    End of Session Equipment Utilized During Treatment: Oxygen Activity Tolerance: Patient tolerated treatment well;No increased pain;Patient limited by fatigue Patient left: in chair;with call bell/phone within reach;with chair alarm set Nurse Communication: Mobility status PT Visit Diagnosis: Other abnormalities of gait and mobility (R26.89);Muscle weakness (generalized) (M62.81);Difficulty in walking, not elsewhere classified (R26.2)  Time: 7209-1980 PT Time Calculation (min) (ACUTE ONLY): 43 min   Charges:   PT Evaluation $PT Eval Moderate Complexity: 1 Mod PT Treatments $Therapeutic Exercise: 8-22 mins $Therapeutic Activity: 8-22 mins      3:55 PM, 12/24/21 Etta Grandchild, PT, DPT Physical Therapist - [facility]  [phone] ([place])    Killian C 12/24/2021, 3:52 PM

## 2021-12-24 NOTE — Evaluation (Addendum)
Occupational Therapy Evaluation Patient Details Name: Kara [name] MRN: [id number] DOB: [birth year] Today's Date: 12/24/2021   History of Present Illness Kara [name] is a 76 y.o. female seen in ed for cough found to be covid positive and meet sepsis criteria.   Clinical Impression   Patient agreeable to OT evaluation. Patient presenting with new O2 requirement (on 3L), SOB, decreased endurance, and decreased strength impacting safety and independence in ADLs. At baseline, patient is independent with ADLs, receives assistance from husband for all IADLs, and is independent for functional mobility without an AD. Patient currently functioning at supervision for bed mobility, Min guard for functional mobility to the sink using RW, set up to supervision for standing grooming tasks, and Max A for LB dressing. Patient requiring seated rest break during standing grooming tasks after <5 min. Education provided for pursed lip breathing and energy conservation techniques. Patient reports husband will be able to provide physical assistance as needed for ADLs/IADLs upon discharge. Patient will benefit from acute OT to increase overall independence in the areas of ADLs and functional mobility in order to safely discharge home. Pt could benefit from [facility] following D/C to decrease falls risk, improve activity tolerance, and maximize independence in self-care within own home environment.      Recommendations for follow up therapy are one component of a multi-disciplinary discharge planning process, led by the attending physician.  Recommendations may be updated based on patient status, additional functional criteria and insurance authorization.   Follow Up Recommendations  Home health OT    Assistance Recommended at Discharge Intermittent Supervision/Assistance  Patient can return home with the following A little help with walking and/or transfers;Assistance with cooking/housework;Assist for transportation;Help  with stairs or ramp for entrance;A little help with bathing/dressing/bathroom    Functional Status Assessment  Patient has had a recent decline in their functional status and demonstrates the ability to make significant improvements in function in a reasonable and predictable amount of time.   Equipment Recommendations  Tub/shower bench;BSC/3in1    Recommendations for Other Services       Precautions / Restrictions Precautions Precautions: Fall Restrictions Weight Bearing Restrictions: No      Mobility Bed Mobility Overal bed mobility: Needs Assistance Bed Mobility: Sit to Supine       Sit to supine: Supervision, HOB elevated        Transfers Overall transfer level: Needs assistance Equipment used: Rolling walker (2 wheels) Transfers: Sit to/from Stand Sit to Stand: Supervision                  Balance Overall balance assessment: Needs assistance Sitting-balance support: Feet supported, Bilateral upper extremity supported Sitting balance-Leahy Scale: Good     Standing balance support: Bilateral upper extremity supported, During functional activity, Single extremity supported Standing balance-Leahy Scale: Fair Standing balance comment: use of RW during functional mobility, single extremity supported while standing at sink                           ADL either performed or assessed with clinical judgement   ADL Overall ADL's : Needs assistance/impaired     Grooming: Set up;Standing;Supervision/safety;Wash/dry face Grooming Details (indicate cue type and reason): Pt completed grooming tasks while standing at the sink             Lower Body Dressing: Maximal assistance;Sitting/lateral leans Lower Body Dressing Details (indicate cue type and reason): anticipate Max A to don/doff socks in sitting Toilet Transfer:  Rolling walker (2 wheels);Supervision/safety Toilet Transfer Details (indicate cue type and reason): simulated with STS from  [place] and Hygiene: Min guard;Sit to/from stand Toileting - Clothing Manipulation Details (indicate cue type and reason): anticipate Min guard in standing     Functional mobility during ADLs: Min guard;Rolling walker (2 wheels) (~5 ft to the sink)       Vision Baseline Vision/History: 1 Wears glasses Patient Visual Report: No change from baseline       Perception     Praxis      Pertinent Vitals/Pain Pain Assessment Pain Assessment: No/denies pain     Hand Dominance     Extremity/Trunk Assessment Upper Extremity Assessment Upper Extremity Assessment: Generalized weakness   Lower Extremity Assessment Lower Extremity Assessment: Generalized weakness   Cervical / Trunk Assessment Cervical / Trunk Assessment: Normal   Communication Communication Communication: No difficulties   Cognition Arousal/Alertness: Awake/alert Behavior During Therapy: WFL for tasks assessed/performed Overall Cognitive Status: Within Functional Limits for tasks assessed                                       General Comments  Pt experiencing dyspnea with exertion on 3L O2 via , unable to get an accurate reading of SpO2 during activity due to poor pleth. Pt with c/o dizziness and returned to sitting to get vitals, BP 152/66 (92), HR 65, SpO2 90-96% on 3L. Pt educated on pursed lip breathing and energy conservation.    Exercises     Shoulder Instructions      Home Living Family/patient expects to be discharged to:: Private residence Living Arrangements: Spouse/significant other Available Help at Discharge: Family;Available 24 hours/day Type of Home: House Home Access: Stairs to enter [employer] of Steps: 1  3-4" partial step Entrance Stairs-Rails: None Home Layout: One level     Bathroom Shower/Tub: Teacher, early years/pre: Standard     Home Equipment: Cane - single point;Grab bars - tub/shower   Additional  Comments: son nearby available PRN, husband at home 24/7 but has dementia      Prior Functioning/Environment Prior Level of Function : Needs assist             Mobility Comments: IND with no AD ADLs Comments: IND with ADLs, husband IADLs, both retired        OT Problem List: Cardiopulmonary status limiting activity;Decreased strength;Decreased activity tolerance;Impaired balance (sitting and/or standing)      OT Treatment/Interventions: Self-care/ADL training;Therapeutic exercise;Therapeutic activities;Energy conservation;DME and/or AE instruction;Patient/family education;Balance training    OT Goals(Current goals can be found in the care plan section) Acute Rehab OT Goals Patient Stated Goal: to go home OT Goal Formulation: With patient Time For Goal Achievement: 01/07/22 Potential to Achieve Goals: Good ADL Goals Pt Will Perform Grooming: Independently;standing Pt Will Perform Upper Body Dressing: Independently;sitting Pt Will Perform Lower Body Dressing: Independently;with adaptive equipment;sit to/from stand Pt Will Transfer to Toilet: ambulating;grab bars;regular height toilet;Independently Pt Will Perform Toileting - Clothing Manipulation and hygiene: Independently;sit to/from stand;with adaptive equipment Additional ADL Goal #1: Pt will verbalize 2-3 Energy conservation techniques with Min VC  OT Frequency: Min 2X/week    Co-evaluation              AM-PAC OT "6 Clicks" Daily Activity     Outcome Measure Help from another person eating meals?: None Help from another person taking care of personal grooming?:  A Little Help from another person toileting, which includes using toliet, bedpan, or urinal?: A Little Help from another person bathing (including washing, rinsing, drying)?: A Lot Help from another person to put on and taking off regular upper body clothing?: None Help from another person to put on and taking off regular lower body clothing?: A Lot 6 Click  Score: 18   End of Session Equipment Utilized During Treatment: Gait belt;Rolling walker (2 wheels);Oxygen Nurse Communication: Mobility status  Activity Tolerance: Patient limited by fatigue Patient left: in bed;with call bell/phone within reach;with bed alarm set  OT Visit Diagnosis: Muscle weakness (generalized) (M62.81);Unsteadiness on feet (R26.81);Dizziness and giddiness (R42)                Time: 1013-1050 OT Time Calculation (min): 37 min Charges:  OT General Charges $OT Visit: 1 Visit OT Evaluation $OT Eval Low Complexity: 1 Low  [facility] MS, OTR/L ascom [phone]  12/24/21, 1:53 PM

## 2021-12-24 NOTE — Progress Notes (Signed)
[facility] Progress Note  Patient: Kara [name]    DVV:[id number]  DOA: 12/21/2021    Date of Service: the patient was seen and examined on 12/24/2021  Brief hospital course: 76 year old female with past medical history of diabetes mellitus type 2, stage IV chronic kidney disease, breast cancer and obesity who presented to the emergency room on 9/1 with shortness of breath and cough for several weeks and patient found to be hypoxic requiring 2 L nasal cannula and found to have severe sepsis from COVID-pneumonia.  Patient started on IV steroids, antibiotics and IV Remdisivir.  After several days, diarrhea is a little improved, however patient still continues to complain of dizziness, dyspnea on exertion.  Seen by PT noting limited ambulation due to hypoxia and dyspnea.  Assessment and Plan: Assessment and Plan: * Severe sepsis (HCC)-resolved as of 12/24/2021 Pt meets severe sepsis criteria on admission, given tachypnea, tachycardia and pneumonia source plus lactic acidosis and acute kidney injury with creatinine greater than 2.  Remdisivir completed 3-day course, but with continued hypoxia, will extend to 5 days.  Continue IV fluids as well as IV antibiotics given elevated procalcitonin.  Acute respiratory failure with hypoxia (HCC) Supplemental oxygen as needed to keep goal o2 sats above 94%.  Improved, currently 95% on 3 L, however with ambulation, O2 sats dropped below 90% on 3 L.  Some of this may be in part due to heart failure.  BNP mildly elevated on 9/2.  Patient has no previous history of heart failure with a normal echocardiogram in 2021.  We will try some IV Lasix and check an echo.  Pneumonia due to COVID-19 virus Continue Remdisivir and steroids.  CRP mild and continues to trend downward.   Acute kidney injury superimposed on stage IV chronic kidney disease ([place]) Steadily improving.Secondary to diarrhea and dehydration.  We will continue to follow, given that we are stopping her  fluids and starting some Lasix.  Diarrhea due to COVID-19 Started following admission.  Patient complains of significant diarrhea and abdominal discomfort.  CT notes diverticulosis only.  See below.  Diarrhea likely from [place] and is now much improved after starting Imodium.  Essential hypertension Blood pressures have been trending upward, likely in part may be due to fluid retention from heart failure.  As above.    Diverticulosis large intestine w/o perforation or abscess w/o bleeding Following severe diarrhea, CT of abdomen/pelvis done noting severe sigmoid diverticulosis with tethering of proximal and distal sigmoid colon within the left lower quadrant which may reflect postinflammatory changes from remote diverticulitis.  No inflammatory stranding or fluid collections identified.  Diarrhea reaffirmed to be from [place].    Generalized weakness Seen by PT and OT and patient quite deconditioned as well as easily dyspneic.  For now they are recommending short-term rehab however, condition may improve with continued physical therapy.  Anemia Anemia is chronic see trend below.  We will follow / type and screen.  Additional eval if hemoglobin drops per primary attending.  DM II (diabetes mellitus, type II), controlled ([place]) Glycemic protocol.    GERD (gastroesophageal reflux disease) IV PPI.   Obesity (BMI 30-39.9) Needs.  BMI greater than 30       Body mass index is 31.76 kg/m.        Consultants: None  Procedures: Echocardiogram ordered  Antimicrobials: IV Remdisivir 9/2-present IV Rocephin and Zithromax 9/1-present  Code Status: Full code   Subjective: Patient still feels rough.  Less diarrhea, but feels more short of breath.  Objective: Vital signs were reviewed and unremarkable. Vitals:   12/23/21 2304 12/24/21 0855  BP: (!) 157/59 (!) 159/80  Pulse: 84 93  Resp: 16 16  Temp: 98 F (36.7 C) 98 F (36.7 C)  SpO2: 96% 95%    Intake/Output  Summary (Last 24 hours) at 12/24/2021 1346 Last data filed at 12/24/2021 0500 Gross per 24 hour  Intake 968.37 ml  Output 0 ml  Net 968.37 ml    Filed Weights   12/21/21 2128  Weight: 83.9 kg   Body mass index is 31.76 kg/m.  Exam:  General: Alert and oriented x3, moderate distress from fatigue HEENT: Normocephalic and atraumatic, mucous membranes slightly dry Cardiovascular: Regular rate and rhythm, S1-S2 Respiratory: Clear to auscultation bilaterally, breathing labored Abdomen: Soft, minimal tenderness, obese, positive bowel sounds Musculoskeletal: No clubbing or cyanosis or edema Skin: No skin breaks, tears or lesions, ecchymoses over abdomen Psychiatry: Appropriate, no evidence of psychoses Neurology: No focal deficits  Data Reviewed: Renal function continues to improve  Disposition:  Status is: Inpatient Remains inpatient appropriate because:  Continued Remdisivir Assessment for CHF and diuresing    Anticipated discharge date: 9/6  Family Communication: Patient declined for me to call her husband who has dementia DVT Prophylaxis: heparin injection 5,000 Units Start: 12/22/21 0600    Author: Annita Brod ,MD 12/24/2021 1:46 PM  To reach On-call, see care teams to locate the attending and reach out via [email or web address]. Between 7PM-7AM, please contact night-coverage If you still have difficulty reaching the attending provider, please page the [facility] (Director on Call) for [facility] on amion for assistance.

## 2021-12-24 NOTE — TOC Initial Note (Signed)
Transition of Care [facility]) - Initial/Assessment Note    Patient Details  Name: Kara [name] MRN: [id number] Date of Birth: 07/27/1945  [facility] CM/SW Contact:    Ninfa Meeker, RN Phone Number: 12/24/2021, 11:47 AM  Clinical Narrative:   Transition of Care Screening Note:  Transition of Care Department [facility]) has reviewed patient and no TOC needs have been identified at this time. We will continue to monitor patient advancement through Interdisciplinary progressions. If new patient transition needs arise, please place a consult.                          Patient Goals and CMS Choice        Expected Discharge Plan and Services                                                Prior Living Arrangements/Services                       Activities of Daily Living Home Assistive Devices/Equipment: Eyeglasses, Kasandra Knudsen (specify quad or straight) ADL Screening (condition at time of admission) Patient's cognitive ability adequate to safely complete daily activities?: Yes Is the patient deaf or have difficulty hearing?: No Does the patient have difficulty seeing, even when wearing glasses/contacts?: No Does the patient have difficulty concentrating, remembering, or making decisions?: No Patient able to express need for assistance with ADLs?: Yes Does the patient have difficulty dressing or bathing?: Yes Independently performs ADLs?: Yes (appropriate for developmental age) Does the patient have difficulty walking or climbing stairs?: Yes Weakness of Legs: Both Weakness of Arms/Hands: None  Permission Sought/Granted                  Emotional Assessment              Admission diagnosis:  Acute respiratory failure with hypoxia ([place]) [J96.01] Severe sepsis ([place]) [A41.9, R65.20] Sepsis, due to unspecified organism, unspecified whether acute organ dysfunction present ([place]) [A41.9] COVID [U07.1] Acute cough [R05.1] Pneumonia due to  COVID-19 virus [U07.1, J12.82] Patient Active Problem List   Diagnosis Date Noted   Diverticulosis large intestine w/o perforation or abscess w/o bleeding 12/23/2021   Generalized weakness 12/22/2021   Pneumonia due to COVID-19 virus 12/22/2021   Acute respiratory failure with hypoxia ([place]) 12/22/2021   Anemia 12/22/2021   Obesity (BMI 30-39.9) 12/22/2021   Diarrhea due to COVID-19 12/22/2021   DM II (diabetes mellitus, type II), controlled ([place]) 10/17/2020   Synovial cyst of right popliteal space 12/18/2019   Encounter for general adult medical examination with abnormal findings 10/17/2019   Bilateral lower extremity edema 10/17/2019   Atopic dermatitis 10/17/2019   Hyperparathyroidism due to renal insufficiency ([place]) 09/30/2019   Localized edema 09/30/2019   Chest pain 08/16/2019   Alopecia 08/16/2019   Fever 03/22/2019   Cellulitis of right breast 03/22/2019   CKD (chronic kidney disease), stage IV ([place]) 03/22/2019   Aortic atherosclerosis ([place]) 03/22/2019   Severe sepsis ([place]) 03/21/2019   GERD (gastroesophageal reflux disease) 03/21/2019   Rash 03/21/2019   Acute kidney injury superimposed on stage IV chronic kidney disease ([place]) 03/21/2019   Type 2 diabetes mellitus with hyperglycemia ([place]) 01/07/2019   Ductal carcinoma in situ (DCIS) of right breast 01/01/2019   Screening for breast cancer 10/03/2018  Dyspnea on exertion 10/03/2018   Dysuria 10/03/2018   Diabetic polyneuropathy associated with type 2 diabetes mellitus ([place]) 06/29/2018   Type 2 diabetes mellitus with diabetic neuropathy ([place]) 06/29/2018   Uncontrolled type 2 diabetes mellitus with hyperglycemia ([place]) 03/30/2018   Inflammatory polyarthropathy ([place]) 03/30/2018   Benign essential tremor 03/30/2018   Irritable bowel syndrome with diarrhea 12/07/2017   Essential hypertension 12/07/2017   Mixed hyperlipidemia 12/07/2017   PCP:  Jonetta Osgood, NP Pharmacy:   [id number] - [place], [address]Phone: [phone]: [phone] [facility] [place] [id number]: [phone]: [phone] [id number] [address]: [phone]: [phone]   Social Determinants of Health (SDOH) Interventions    Readmission Risk Interventions     No data to display

## 2021-12-25 ENCOUNTER — Inpatient Hospital Stay (HOSPITAL_COMMUNITY)
Admit: 2021-12-25 | Discharge: 2021-12-25 | Disposition: A | Discharge status: Home / Self Care | Payer: Medicare HMO | Attending: Internal Medicine | Admitting: Internal Medicine

## 2021-12-25 DIAGNOSIS — I5033 Acute on chronic diastolic (congestive) heart failure: Secondary | ICD-10-CM | POA: Diagnosis not present

## 2021-12-25 DIAGNOSIS — A419 Sepsis, unspecified organism: Secondary | ICD-10-CM | POA: Diagnosis not present

## 2021-12-25 DIAGNOSIS — I5031 Acute diastolic (congestive) heart failure: Secondary | ICD-10-CM

## 2021-12-25 DIAGNOSIS — J9601 Acute respiratory failure with hypoxia: Secondary | ICD-10-CM | POA: Diagnosis not present

## 2021-12-25 DIAGNOSIS — N179 Acute kidney failure, unspecified: Secondary | ICD-10-CM | POA: Diagnosis not present

## 2021-12-25 LAB — CBC WITH DIFFERENTIAL/PLATELET
Abs Immature Granulocytes: 0.08 10*3/uL — ABNORMAL HIGH (ref 0.00–0.07)
Basophils Absolute: 0 10*3/uL (ref 0.0–0.1)
Basophils Relative: 0 %
Eosinophils Absolute: 0 10*3/uL (ref 0.0–0.5)
Eosinophils Relative: 0 %
HCT: 28.8 % — ABNORMAL LOW (ref 36.0–46.0)
Hemoglobin: 9.1 g/dL — ABNORMAL LOW (ref 12.0–15.0)
Immature Granulocytes: 1 %
Lymphocytes Relative: 12 %
Lymphs Abs: 0.9 10*3/uL (ref 0.7–4.0)
MCH: 27.1 pg (ref 26.0–34.0)
MCHC: 31.6 g/dL (ref 30.0–36.0)
MCV: 85.7 fL (ref 80.0–100.0)
Monocytes Absolute: 0.6 10*3/uL (ref 0.1–1.0)
Monocytes Relative: 9 %
Neutro Abs: 5.8 10*3/uL (ref 1.7–7.7)
Neutrophils Relative %: 78 %
Platelets: 207 10*3/uL (ref 150–400)
RBC: 3.36 MIL/uL — ABNORMAL LOW (ref 3.87–5.11)
RDW: 15.9 % — ABNORMAL HIGH (ref 11.5–15.5)
WBC: 7.4 10*3/uL (ref 4.0–10.5)
nRBC: 0 % (ref 0.0–0.2)

## 2021-12-25 LAB — ECHOCARDIOGRAM COMPLETE
AR max vel: 2.47 cm2
AV Area VTI: 2.66 cm2
AV Area mean vel: 2.35 cm2
AV Mean grad: 3 mmHg
AV Peak grad: 5.1 mmHg
Ao pk vel: 1.13 m/s
Area-P 1/2: 3.17 cm2
Height: 64 in
MV VTI: 1.67 cm2
S' Lateral: 2.9 cm
Weight: 2960 oz

## 2021-12-25 LAB — COMPREHENSIVE METABOLIC PANEL
ALT: 28 U/L (ref 0–44)
AST: 33 U/L (ref 15–41)
Albumin: 2.3 g/dL — ABNORMAL LOW (ref 3.5–5.0)
Alkaline Phosphatase: 62 U/L (ref 38–126)
Anion gap: 9 (ref 5–15)
BUN: 40 mg/dL — ABNORMAL HIGH (ref 8–23)
CO2: 22 mmol/L (ref 22–32)
Calcium: 8.7 mg/dL — ABNORMAL LOW (ref 8.9–10.3)
Chloride: 112 mmol/L — ABNORMAL HIGH (ref 98–111)
Creatinine, Ser: 1.58 mg/dL — ABNORMAL HIGH (ref 0.44–1.00)
GFR, Estimated: 34 mL/min — ABNORMAL LOW (ref >60)
Glucose, Bld: 193 mg/dL — ABNORMAL HIGH (ref 70–99)
Potassium: 3.6 mmol/L (ref 3.5–5.1)
Sodium: 143 mmol/L (ref 135–145)
Total Bilirubin: 0.7 mg/dL (ref 0.3–1.2)
Total Protein: 5.8 g/dL — ABNORMAL LOW (ref 6.5–8.1)

## 2021-12-25 LAB — GLUCOSE, CAPILLARY
Glucose-Capillary: 259 mg/dL — ABNORMAL HIGH (ref 70–99)
Glucose-Capillary: 160 mg/dL — ABNORMAL HIGH (ref 70–99)
Glucose-Capillary: 170 mg/dL — ABNORMAL HIGH (ref 70–99)
Glucose-Capillary: 240 mg/dL — ABNORMAL HIGH (ref 70–99)
Glucose-Capillary: 170 mg/dL — ABNORMAL HIGH (ref 70–99)

## 2021-12-25 LAB — C-REACTIVE PROTEIN: CRP: 1.6 mg/dL — ABNORMAL HIGH (ref <1.0)

## 2021-12-25 LAB — PHOSPHORUS: Phosphorus: 3.8 mg/dL (ref 2.5–4.6)

## 2021-12-25 LAB — FERRITIN: Ferritin: 222 ng/mL (ref 11–307)

## 2021-12-25 LAB — D-DIMER, QUANTITATIVE: D-Dimer, Quant: 1.4 ug/mL-FEU — ABNORMAL HIGH (ref 0.00–0.50)

## 2021-12-25 LAB — MAGNESIUM: Magnesium: 2 mg/dL (ref 1.7–2.4)

## 2021-12-25 NOTE — Plan of Care (Signed)
  Problem: Education: Goal: Ability to describe self-care measures that may prevent or decrease complications (Diabetes Survival Skills Education) will improve Outcome: Progressing Goal: Individualized Educational Video(s) Outcome: Progressing   Problem: Coping: Goal: Ability to adjust to condition or change in health will improve Outcome: Progressing   Problem: Fluid Volume: Goal: Ability to maintain a balanced intake and output will improve Outcome: Progressing   Problem: Health Behavior/Discharge Planning: Goal: Ability to identify and utilize available resources and services will improve Outcome: Progressing Goal: Ability to manage health-related needs will improve Outcome: Progressing   Problem: Metabolic: Goal: Ability to maintain appropriate glucose levels will improve Outcome: Progressing   Problem: Nutritional: Goal: Maintenance of adequate nutrition will improve Outcome: Progressing Goal: Progress toward achieving an optimal weight will improve Outcome: Progressing   Problem: Skin Integrity: Goal: Risk for impaired skin integrity will decrease Outcome: Progressing   Problem: Tissue Perfusion: Goal: Adequacy of tissue perfusion will improve Outcome: Progressing   Problem: Education: Goal: Knowledge of risk factors and measures for prevention of condition will improve Outcome: Progressing   Problem: Coping: Goal: Psychosocial and spiritual needs will be supported Outcome: Progressing   Problem: Respiratory: Goal: Will maintain a patent airway Outcome: Progressing Goal: Complications related to the disease process, condition or treatment will be avoided or minimized Outcome: Progressing   Problem: Education: Goal: Knowledge of General Education information will improve Description: Including pain rating scale, medication(s)/side effects and non-pharmacologic comfort measures Outcome: Progressing   Problem: Health Behavior/Discharge Planning: Goal:  Ability to manage health-related needs will improve Outcome: Progressing   Problem: Clinical Measurements: Goal: Ability to maintain clinical measurements within normal limits will improve Outcome: Progressing Goal: Will remain free from infection Outcome: Progressing Goal: Diagnostic test results will improve Outcome: Progressing Goal: Respiratory complications will improve Outcome: Progressing Goal: Cardiovascular complication will be avoided Outcome: Progressing   Problem: Activity: Goal: Risk for activity intolerance will decrease Outcome: Progressing   Problem: Nutrition: Goal: Adequate nutrition will be maintained Outcome: Progressing   Problem: Coping: Goal: Level of anxiety will decrease Outcome: Progressing   Problem: Elimination: Goal: Will not experience complications related to bowel motility Outcome: Progressing Goal: Will not experience complications related to urinary retention Outcome: Progressing   Problem: Pain Managment: Goal: General experience of comfort will improve Outcome: Progressing   Problem: Safety: Goal: Ability to remain free from injury will improve Outcome: Progressing   Problem: Skin Integrity: Goal: Risk for impaired skin integrity will decrease Outcome: Progressing   

## 2021-12-25 NOTE — Progress Notes (Signed)
Vitals entered manually ° °

## 2021-12-25 NOTE — Progress Notes (Signed)
*  PRELIMINARY RESULTS* Echocardiogram 2D Echocardiogram has been performed.  Kara [name] 12/25/2021, 8:01 AM

## 2021-12-25 NOTE — Progress Notes (Signed)
[facility] Progress Note  Patient: Kara [name]    BSJ:[id number]  DOA: 12/21/2021    Date of Service: the patient was seen and examined on 12/25/2021  Brief hospital course: 76 year old female with past medical history of diabetes mellitus type 2, stage IV chronic kidney disease, breast cancer and obesity who presented to the emergency room on 9/1 with shortness of breath and cough for several weeks and patient found to be hypoxic requiring 2 L nasal cannula and found to have severe sepsis from COVID-pneumonia.  Patient started on IV steroids, antibiotics and IV Remdisivir.  After several days, diarrhea is a little improved, however patient still continues to complain of dizziness, dyspnea on exertion.  Seen by PT noting limited ambulation due to hypoxia and dyspnea.  Patient's breathing and diarrhea have since improved.  She is still quite deconditioned and has increased dyspnea and heart rate with activity.  Assessment and Plan: Assessment and Plan: * Severe sepsis (HCC)-resolved as of 12/24/2021 Pt meets severe sepsis criteria on admission, given tachypnea, tachycardia and pneumonia source plus lactic acidosis and acute kidney injury with creatinine greater than 2.  Remdisivir completed 3-day course, but with continued hypoxia, will extend to 5 days.  Continue IV fluids as well as IV antibiotics given elevated procalcitonin.  Acute respiratory failure with hypoxia (HCC) Supplemental oxygen as needed to keep goal o2 sats above 94%.  Improved, currently 95% on 3 L, however with ambulation, O2 sats dropped below 90% on 3 L.  Some of this is related to some mild acute diastolic heart failure.  Pneumonia due to COVID-19 virus Continue Remdisivir and steroids.  CRP mild and continues to trend downward.   Acute kidney injury superimposed on stage IV chronic kidney disease ([place]) Steadily improving.Secondary to diarrhea and dehydration.  We will continue to follow, given that we are stopping her  fluids and starting some Lasix.  Acute on chronic diastolic CHF (congestive heart failure) (HCC)-resolved as of 12/25/2021 Noted to have some worsening shortness of breath and BNP checked and found to be mildly elevated on 9/2.  No previous history of heart failure.  Echocardiogram checked and patient found to have grade 1 diastolic dysfunction.  Status post 2 doses of Lasix.  Did help with breathing.  Diarrhea due to COVID-19-resolved as of 12/25/2021 Started following admission.  Patient complains of significant diarrhea and abdominal discomfort.  CT notes diverticulosis only.  See below.  Diarrhea likely from [place] has since improved.  Treated with Imodium.  Essential hypertension Blood pressures have been trending upward, likely in part may be due to fluid retention from heart failure.  As above.    Diverticulosis large intestine w/o perforation or abscess w/o bleeding Following severe diarrhea, CT of abdomen/pelvis done noting severe sigmoid diverticulosis with tethering of proximal and distal sigmoid colon within the left lower quadrant which may reflect postinflammatory changes from remote diverticulitis.  No inflammatory stranding or fluid collections identified.  Diarrhea reaffirmed to be from [place].    Generalized weakness Seen by PT and OT and patient quite deconditioned as well as easily dyspneic.  For now they are recommending short-term rehab however, condition may improve with continued physical therapy.  Anemia Anemia is chronic see trend below.  We will follow / type and screen.  Additional eval if hemoglobin drops per primary attending.  DM II (diabetes mellitus, type II), controlled ([place]) Glycemic protocol.    GERD (gastroesophageal reflux disease) IV PPI.   Obesity (BMI 30-39.9) Needs.  BMI greater than  30       Body mass index is 31.76 kg/m.        Consultants: None  Procedures: Echocardiogram done 9/5: Grade 1 diastolic dysfunction, preserved  ejection fraction  Antimicrobials: IV Remdisivir 9/2-9/6 IV Rocephin and Zithromax 9/1-present  Code Status: Full code   Subjective: Patient complains of being very tired  Objective: Vital signs were reviewed and unremarkable. Vitals:   12/25/21 0531 12/25/21 0916  BP: (!) 156/77 (!) 147/87  Pulse: (!) 106 (!) 103  Resp: 20 20  Temp: 97.8 F (36.6 C) 97.9 F (36.6 C)  SpO2: 97% 99%    Intake/Output Summary (Last 24 hours) at 12/25/2021 1629 Last data filed at 12/25/2021 1537 Gross per 24 hour  Intake 589.42 ml  Output --  Net 589.42 ml     Filed Weights   12/21/21 2128  Weight: 83.9 kg   Body mass index is 31.76 kg/m.  Exam:  General: Alert and oriented x3, moderate distress from fatigue HEENT: Normocephalic and atraumatic, mucous membranes slightly dry Cardiovascular: Regular rate and rhythm, S1-S2 Respiratory: Clear to auscultation bilaterally Abdomen: Soft, minimal tenderness, obese, positive bowel sounds Musculoskeletal: No clubbing or cyanosis or edema Skin: No skin breaks, tears or lesions, ecchymoses over abdomen Psychiatry: Appropriate, no evidence of psychoses Neurology: No focal deficits  Data Reviewed: Renal function stable.  Disposition:  Status is: Inpatient Remains inpatient appropriate because:  Improvement in physical activity    Anticipated discharge date: 9/6  Family Communication: Patient declined for me to call her husband who has dementia DVT Prophylaxis: heparin injection 5,000 Units Start: 12/22/21 0600    Author: Annita Brod ,MD 12/25/2021 4:29 PM  To reach On-call, see care teams to locate the attending and reach out via [email or web address]. Between 7PM-7AM, please contact night-coverage If you still have difficulty reaching the attending provider, please page the [facility] (Director on Call) for [facility] on amion for assistance.

## 2021-12-25 NOTE — Care Management Important Message (Signed)
Important Message  Patient Details  Name: Kara [name] MRN: [id number] Date of Birth: 1946-03-16   Medicare Important Message Given:  N/A - LOS <3 / Initial given by admissions     [name] 12/25/2021, 10:25 AM

## 2021-12-25 NOTE — Progress Notes (Signed)
Physical Therapy Treatment Patient Details Name: Kara [name] MRN: [id number] DOB: 09-23-1945 Today's Date: 12/25/2021   History of Present Illness Kara [name] is a 76yoF who comes to [facility] after 2 weeks of cough, found to be (+) COVID 19. PTA pt was a limited community AMB with [facility], lives with husband, 2 falls in past 12 months.    PT Comments    The pt presents this session with the need to toilet. She demonstrates impaired static and dynamic balance, with an inability to perform static standing without UE support. The pt requires close supervision for transfers d/t fall risks. She requires increased time for mobility at this time d/t endurance deficits. The pt would greatly benefit from having an O2 tank in her room for ambulation with the bathroom with assistance to address tolerance deficits. It is expected that as she medically progresses she will progress with her mobility and endurance to d/c home with HHPT.     Recommendations for follow up therapy are one component of a multi-disciplinary discharge planning process, led by the attending physician.  Recommendations may be updated based on patient status, additional functional criteria and insurance authorization.  Follow Up Recommendations  Home health PT     Assistance Recommended at Discharge Intermittent Supervision/Assistance  Patient can return home with the following A little help with walking and/or transfers;A little help with bathing/dressing/bathroom;Assistance with cooking/housework;Assist for transportation;Help with stairs or ramp for entrance   Equipment Recommendations  Rolling walker (2 wheels);BSC/3in1    Recommendations for Other Services       Precautions / Restrictions Precautions Precautions: Fall Restrictions Weight Bearing Restrictions: No     Mobility  Bed Mobility Overal bed mobility: Needs Assistance Bed Mobility: Supine to Sit     Supine to sit: HOB elevated, Modified independent  (Device/Increase time) Sit to supine: Modified independent (Device/Increase time), HOB elevated        Transfers Overall transfer level: Needs assistance Equipment used: None Transfers: Bed to chair/wheelchair/BSC Sit to Stand: Min guard Stand pivot transfers: Min guard         General transfer comment: Pt demonstrating need for bilateral UE assistance for balance.    Ambulation/Gait               General Gait Details: unable to tolerate this session.   Stairs             Wheelchair Mobility    Modified Rankin (Stroke Patients Only)       Balance Overall balance assessment: Needs assistance Sitting-balance support: Feet supported Sitting balance-Leahy Scale: Good     Standing balance support: During functional activity, No upper extremity supported Standing balance-Leahy Scale: Poor Standing balance comment: Pt demonstrating need for grabbing at railing for steadying during BSC to bed transfer. Unable to complete transfer without UE assistance.                            Cognition Arousal/Alertness: Awake/alert Behavior During Therapy: WFL for tasks assessed/performed Overall Cognitive Status: Within Functional Limits for tasks assessed                                          Exercises Other Exercises Other Exercises: The pt reports RPE of 10/10 following bed<>BSC transfer and personal hygiene.    General Comments  Pertinent Vitals/Pain Pain Assessment Pain Assessment: No/denies pain    Home Living                          Prior Function            PT Goals (current goals can now be found in the care plan section) Acute Rehab PT Goals Patient Stated Goal: regain strength to return home, avoid further deconditioning PT Goal Formulation: With patient Time For Goal Achievement: 01/07/22 Potential to Achieve Goals: Good Progress towards PT goals: Progressing toward goals     Frequency    Min 2X/week      PT Plan Current plan remains appropriate    Co-evaluation              AM-PAC PT "6 Clicks" Mobility   Outcome Measure  Help needed turning from your back to your side while in a flat bed without using bedrails?: A Little Help needed moving from lying on your back to sitting on the side of a flat bed without using bedrails?: A Little Help needed moving to and from a bed to a chair (including a wheelchair)?: A Little Help needed standing up from a chair using your arms (e.g., wheelchair or bedside chair)?: A Little Help needed to walk in hospital room?: A Little Help needed climbing 3-5 steps with a railing? : A Lot 6 Click Score: 17    End of Session Equipment Utilized During Treatment: Oxygen Activity Tolerance: Patient limited by fatigue Patient left: in bed;with bed alarm set Nurse Communication: Mobility status (Pt would benefit from O2 tank in room to begin ambulation to the bathroom for toileting.) PT Visit Diagnosis: Other abnormalities of gait and mobility (R26.89);Muscle weakness (generalized) (M62.81);Difficulty in walking, not elsewhere classified (R26.2)     Time: 8413-2440 PT Time Calculation (min) (ACUTE ONLY): 34 min  Charges:  $Therapeutic Activity: 23-37 mins                     9:33 AM, 12/25/21 Kara [name] PT, DPT Physical Therapist - [facility]    Kara [name] 12/25/2021, 9:30 AM

## 2021-12-25 NOTE — Assessment & Plan Note (Signed)
Noted to have some worsening shortness of breath and BNP checked and found to be mildly elevated on 9/2.  No previous history of heart failure.  Echocardiogram checked and patient found to have grade 1 diastolic dysfunction.  Status post 2 doses of Lasix.  Did help with breathing.

## 2021-12-26 DIAGNOSIS — R6520 Severe sepsis without septic shock: Secondary | ICD-10-CM | POA: Diagnosis not present

## 2021-12-26 DIAGNOSIS — A419 Sepsis, unspecified organism: Secondary | ICD-10-CM | POA: Diagnosis not present

## 2021-12-26 LAB — COMPREHENSIVE METABOLIC PANEL
ALT: 38 U/L (ref 0–44)
AST: 46 U/L — ABNORMAL HIGH (ref 15–41)
Albumin: 2.5 g/dL — ABNORMAL LOW (ref 3.5–5.0)
Alkaline Phosphatase: 61 U/L (ref 38–126)
Anion gap: 10 (ref 5–15)
BUN: 36 mg/dL — ABNORMAL HIGH (ref 8–23)
CO2: 22 mmol/L (ref 22–32)
Calcium: 8.8 mg/dL — ABNORMAL LOW (ref 8.9–10.3)
Chloride: 109 mmol/L (ref 98–111)
Creatinine, Ser: 1.58 mg/dL — ABNORMAL HIGH (ref 0.44–1.00)
GFR, Estimated: 34 mL/min — ABNORMAL LOW (ref >60)
Glucose, Bld: 223 mg/dL — ABNORMAL HIGH (ref 70–99)
Potassium: 3.9 mmol/L (ref 3.5–5.1)
Sodium: 141 mmol/L (ref 135–145)
Total Bilirubin: 0.9 mg/dL (ref 0.3–1.2)
Total Protein: 6.1 g/dL — ABNORMAL LOW (ref 6.5–8.1)

## 2021-12-26 LAB — CBC WITH DIFFERENTIAL/PLATELET
Abs Immature Granulocytes: 0.16 10*3/uL — ABNORMAL HIGH (ref 0.00–0.07)
Basophils Absolute: 0 10*3/uL (ref 0.0–0.1)
Basophils Relative: 0 %
Eosinophils Absolute: 0 10*3/uL (ref 0.0–0.5)
Eosinophils Relative: 0 %
HCT: 31.6 % — ABNORMAL LOW (ref 36.0–46.0)
Hemoglobin: 9.9 g/dL — ABNORMAL LOW (ref 12.0–15.0)
Immature Granulocytes: 2 %
Lymphocytes Relative: 8 %
Lymphs Abs: 0.8 10*3/uL (ref 0.7–4.0)
MCH: 27.3 pg (ref 26.0–34.0)
MCHC: 31.3 g/dL (ref 30.0–36.0)
MCV: 87.3 fL (ref 80.0–100.0)
Monocytes Absolute: 0.9 10*3/uL (ref 0.1–1.0)
Monocytes Relative: 9 %
Neutro Abs: 7.8 10*3/uL — ABNORMAL HIGH (ref 1.7–7.7)
Neutrophils Relative %: 81 %
Platelets: 197 10*3/uL (ref 150–400)
RBC: 3.62 MIL/uL — ABNORMAL LOW (ref 3.87–5.11)
RDW: 16.2 % — ABNORMAL HIGH (ref 11.5–15.5)
WBC: 9.7 10*3/uL (ref 4.0–10.5)
nRBC: 0 % (ref 0.0–0.2)

## 2021-12-26 LAB — CULTURE, BLOOD (SINGLE)
Culture: NO GROWTH
Special Requests: ADEQUATE

## 2021-12-26 LAB — GLUCOSE, CAPILLARY
Glucose-Capillary: 197 mg/dL — ABNORMAL HIGH (ref 70–99)
Glucose-Capillary: 112 mg/dL — ABNORMAL HIGH (ref 70–99)
Glucose-Capillary: 305 mg/dL — ABNORMAL HIGH (ref 70–99)
Glucose-Capillary: 161 mg/dL — ABNORMAL HIGH (ref 70–99)

## 2021-12-26 LAB — D-DIMER, QUANTITATIVE: D-Dimer, Quant: 1.85 ug/mL-FEU — ABNORMAL HIGH (ref 0.00–0.50)

## 2021-12-26 LAB — FERRITIN: Ferritin: 283 ng/mL (ref 11–307)

## 2021-12-26 LAB — PHOSPHORUS: Phosphorus: 3 mg/dL (ref 2.5–4.6)

## 2021-12-26 LAB — C-REACTIVE PROTEIN: CRP: 0.9 mg/dL (ref <1.0)

## 2021-12-26 LAB — MAGNESIUM: Magnesium: 2 mg/dL (ref 1.7–2.4)

## 2021-12-26 NOTE — Care Management Important Message (Signed)
Important Message  Patient Details  Name: Kara [name] MRN: [id number] Date of Birth: Jan 11, 1946   Medicare Important Message Given:  Other (see comment)  Patient is in an isolation room so I called her room to review the Important Message from Medicare but she asked me to call back as it wasn't a good time. I will try again.   Kara [name] 12/26/2021, 2:21 PM

## 2021-12-26 NOTE — Inpatient Diabetes Management (Signed)
Inpatient Diabetes Program Recommendations  AACE/ADA: New Consensus Statement on Inpatient Glycemic Control   Target Ranges:  Prepandial:   less than 140 mg/dL      Peak postprandial:   less than 180 mg/dL (1-2 hours)      Critically ill patients:  140 - 180 mg/dL    Latest Reference Range & Units 12/25/21 08:07 12/25/21 12:10 12/25/21 16:36 12/25/21 21:09 12/26/21 09:59  Glucose-Capillary 70 - 99 mg/dL 160 (H) 170 (H) 240 (H) 170 (H) 197 (H)    Review of Glycemic Control  Diabetes history: DM2 Outpatient Diabetes medications: Levemir up to 75 units daily, Novolog sliding scale, Trulicity 1.5 mg Qweek Current orders for Inpatient glycemic control: Novolog 0-15 units TID with meals, Novolog 0-5 units QHS; Prednisone 50 mg daily  Inpatient Diabetes Program Recommendations:    Insulin: If steroids are continued, please consider ordering Novolog 3 units TID with meals for meal coverage if patient eats at least 50% of meals.  Thanks, Barnie Alderman, RN, MSN, [facility] Coordinator Inpatient Diabetes Program [phone] (Team Pager from 8am to [place])

## 2021-12-26 NOTE — Progress Notes (Signed)
PROGRESS NOTE    Kara [name]   YDX:[id number] DOB: Sep 19, 1945  DOA: 12/21/2021 Date of Service: 12/26/21 PCP: Jonetta Osgood, NP     Brief Narrative / Hospital Course:  76 year old female with past medical history of diabetes mellitus type 2, stage IV chronic kidney disease, breast cancer and obesity who presented to the emergency room on 9/1 with shortness of breath and cough for several weeks  09/01: patient found to be hypoxic requiring 2 L nasal cannula and found to have severe sepsis from COVID-pneumonia.  Patient started on IV steroids, antibiotics and IV Remdisivir.   After several days, diarrhea is a little improved, however patient still continues to complain of dizziness, dyspnea on exertion.  Seen by PT noting limited ambulation due to hypoxia and dyspnea. Patient's breathing and diarrhea have since improved.  She is still quite deconditioned and has increased dyspnea and heart rate with activity.  Severe sepsis resolved as of 09/04 09/06: still mild hypoxic with increased O2 requirement to 4 L up from 2 L, needing TOC arrange HH, possible home O2.   Consultants:  none  Procedures: none      ASSESSMENT & PLAN:   Active Problems:   Acute respiratory failure with hypoxia (HCC)   Pneumonia due to COVID-19 virus   Acute kidney injury superimposed on stage IV chronic kidney disease (HCC)   Essential hypertension   Generalized weakness   Diverticulosis large intestine w/o perforation or abscess w/o bleeding   Anemia   DM II (diabetes mellitus, type II), controlled (HCC)   GERD (gastroesophageal reflux disease)   Obesity (BMI 30-39.9)  Acute respiratory failure with hypoxia (HCC) - IMPROVED Pneumonia due to COVID-19 virus Cough Continue Remdisivir and steroids to complete course  Supplemental oxygen as needed to keep goal o2 sats above 94%.   Improved, currently 95% on 3 L, however with ambulation, O2 sats dropped below 90% on 3 L.  Some of this is related to  some mild acute diastolic heart failure, see below ?discharge on O2? Pending walk test  Antitussives and antiviral regimen as above  Severe sepsis (HCC) Pt meets severe sepsis criteria on admission, given tachypnea, tachycardia and pneumonia source plus lactic acidosis and acute kidney injury with creatinine greater than 2.   Complete 5-day course of Remdisivir and antibiotics (given due to elevated procalcitonin) today.  Generalized weakness Seen by PT and OT and patient quite deconditioned as well as easily dyspneic.  For now they are recommending short-term rehab however, condition may improve  continued physical therapy Home health arranged per TOC  Acute kidney injury superimposed on stage IV chronic kidney disease ([place]) Steadily improving. Secondary to diarrhea and dehydration.  Follow BMP  Diarrhea due to COVID-19 Started following admission.  Patient complains of significant diarrhea and abdominal discomfort.  CT notes diverticulosis only.  See below.  Diarrhea likely from [place] has since improved.   Treated with Imodium, continue prn  Acute on chronic diastolic CHF (congestive heart failure) ([place]) Noted to have some worsening shortness of breath and BNP checked and found to be mildly elevated on 9/2.  No previous history of heart failure.  Echocardiogram checked and patient found to have grade 1 diastolic dysfunction.   Status post 2 doses of Lasix.  Did help with breathing.  DM II (diabetes mellitus, type II), controlled ([place]) Glycemic protocol.   Essential hypertension Blood pressures have been trending upward, likely in part may be due to fluid retention from heart failure.  As above.  GERD (gastroesophageal reflux disease) IV PPI.  Anemia Anemia is chronic  We will follow / type and screen.  Additional eval if hemoglobin drops   Obesity (BMI 30-39.9) BMI greater than 30  Diverticulosis large intestine w/o perforation or abscess w/o bleeding Following severe  diarrhea, CT of abdomen/pelvis done noting severe sigmoid diverticulosis with tethering of proximal and distal sigmoid colon within the left lower quadrant which may reflect postinflammatory changes from remote diverticulitis.  No inflammatory stranding or fluid collections identified.  Diarrhea reaffirmed to be from [employer].   monitor      DVT prophylaxis: heparin Pertinent IV fluids/nutrition: n/a Central lines / invasive devices: n/a  Code Status: FULL Family Communication: none at this time  Disposition: inpatient for now  [facility] needs: needs home health Barriers to discharge / significant pending items: increased O2 requirement today, arrange HH, may need O2?              Subjective:  Patient reports feeling tired, wanting to go home.  Despite increased oxygen requirement, she is denying significant shortness of breath, no chest pain, no headache/vision change, no fever/chills.       Objective:  Vitals:   12/25/21 1929 12/26/21 0236 12/26/21 0521 12/26/21 0826  BP: (!) 145/85 (!) 152/74 (!) 151/88 (!) 154/74  Pulse: 90 84 98 (!) 103  Resp: '16 15 18   '$ Temp: 98.2 F (36.8 C) 98.3 F (36.8 C) 98 F (36.7 C) (!) 97.5 F (36.4 C)  TempSrc:      SpO2: 96% 94% (!) 85% (!) 89%  Weight:      Height:        Intake/Output Summary (Last 24 hours) at 12/26/2021 1502 Last data filed at 12/26/2021 0335 Gross per 24 hour  Intake 828.05 ml  Output --  Net 828.05 ml   Filed Weights   12/21/21 2128  Weight: 83.9 kg    Examination:  Constitutional:  VS as above General Appearance: alert, well-developed, well-nourished, NAD Eyes: Normal lids and conjunctive, non-icteric sclera Ears, Nose, Mouth, Throat: Normal external appearance MMM Neck: No masses, trachea midline Respiratory: Normal respiratory effort No wheeze No rhonchi No rales Cardiovascular: S1/S2 normal No murmur No rub/gallop auscultated No JVD No lower extremity edema Gastrointestinal: No  tenderness Musculoskeletal:  No clubbing/cyanosis of digits Symmetrical movement in all extremities Neurological: No cranial nerve deficit on limited exam Alert Psychiatric: Normal judgment/insight Normal mood and affect       Scheduled Medications:   vitamin C  500 mg Oral Daily   aspirin EC  81 mg Oral Daily   docusate sodium  100 mg Oral BID   furosemide  20 mg Intravenous BID   heparin  5,000 Units Subcutaneous Q8H   insulin aspart  0-15 Units Subcutaneous TID WC   insulin aspart  0-5 Units Subcutaneous QHS   predniSONE  50 mg Oral Daily   sodium chloride flush  3 mL Intravenous Q12H   sodium chloride flush  3 mL Intravenous Q12H   tamoxifen  20 mg Oral Daily   zinc sulfate  220 mg Oral Daily    Continuous Infusions:    PRN Medications:  acetaminophen, albuterol, bisacodyl, chlorpheniramine-HYDROcodone, guaiFENesin-dextromethorphan, loperamide, ondansetron **OR** ondansetron (ZOFRAN) IV, oxyCODONE, polyethylene glycol, prochlorperazine, sodium chloride flush, sodium phosphate  Antimicrobials:  Anti-infectives (From admission, onward)    Start     Dose/Rate Route Frequency Ordered Stop   12/25/21 1000  remdesivir 100 mg in sodium chloride 0.9 % 100 mL IVPB  100 mg 200 mL/hr over 30 Minutes Intravenous Daily 12/24/21 1543 12/26/21 1047   12/23/21 1000  remdesivir 100 mg in sodium chloride 0.9 % 100 mL IVPB       See Hyperspace for full Linked Orders Report.   100 mg 200 mL/hr over 30 Minutes Intravenous Daily 12/22/21 0231 12/24/21 1713   12/22/21 0800  remdesivir 200 mg in sodium chloride 0.9% 250 mL IVPB       See Hyperspace for full Linked Orders Report.   200 mg 580 mL/hr over 30 Minutes Intravenous Once 12/22/21 0231 12/24/21 0843   12/21/21 2130  cefTRIAXone (ROCEPHIN) 2 g in sodium chloride 0.9 % 100 mL IVPB        2 g 200 mL/hr over 30 Minutes Intravenous Every 24 hours 12/21/21 2121 12/25/21 2225   12/21/21 2130  azithromycin (ZITHROMAX) 500 mg  in sodium chloride 0.9 % 250 mL IVPB        500 mg 250 mL/hr over 60 Minutes Intravenous Every 24 hours 12/21/21 2121 12/25/21 2354       Data Reviewed: I have personally reviewed following labs and imaging studies  CBC: Recent Labs  Lab 12/21/21 2141 12/22/21 0430 12/23/21 0514 12/24/21 0458 12/25/21 0812 12/26/21 0910  WBC 9.4 6.6 13.5* 10.4 7.4 9.7  NEUTROABS 6.9  --  12.1* 9.3* 5.8 7.8*  HGB 10.2* 8.3* 9.3* 10.9* 9.1* 9.9*  HCT 32.8* 27.1* 29.2* 35.0* 28.8* 31.6*  MCV 89.1 90.0 87.2 86.8 85.7 87.3  PLT 167 145* 181 249 207 465   Basic Metabolic Panel: Recent Labs  Lab 12/21/21 2141 12/22/21 0430 12/23/21 0514 12/24/21 0458 12/25/21 0812 12/26/21 0910  NA 139  --  139 141 143 141  K 3.7  --  3.9 4.2 3.6 3.9  CL 107  --  108 109 112* 109  CO2 20*  --  '24 22 22 22  '$ GLUCOSE 225*  --  196* 255* 193* 223*  BUN 27*  --  32* 34* 40* 36*  CREATININE 2.05* 1.87* 1.61* 1.53* 1.58* 1.58*  CALCIUM 8.6*  --  8.6* 8.8* 8.7* 8.8*  MG  --   --  1.7 1.9 2.0 2.0  PHOS  --   --  3.3 3.4 3.8 3.0   GFR: Estimated Creatinine Clearance: 32.2 mL/min (A) (by C-G formula based on SCr of 1.58 mg/dL (H)). Liver Function Tests: Recent Labs  Lab 12/21/21 2141 12/23/21 0514 12/24/21 0458 12/25/21 0812 12/26/21 0910  AST 32 33 32 33 46*  ALT '18 23 27 28 '$ 38  ALKPHOS 79 65 72 62 61  BILITOT 1.0 0.7 0.8 0.7 0.9  PROT 7.1 6.2* 6.9 5.8* 6.1*  ALBUMIN 2.7* 2.3* 2.6* 2.3* 2.5*   No results for input(s): "LIPASE", "AMYLASE" in the last 168 hours. No results for input(s): "AMMONIA" in the last 168 hours. Coagulation Profile: No results for input(s): "INR", "PROTIME" in the last 168 hours. Cardiac Enzymes: No results for input(s): "CKTOTAL", "CKMB", "CKMBINDEX", "TROPONINI" in the last 168 hours. BNP (last 3 results) No results for input(s): "PROBNP" in the last 8760 hours. HbA1C: No results for input(s): "HGBA1C" in the last 72 hours. CBG: Recent Labs  Lab 12/25/21 1210  12/25/21 1636 12/25/21 2109 12/26/21 0959 12/26/21 1148  GLUCAP 170* 240* 170* 197* 112*   Lipid Profile: No results for input(s): "CHOL", "HDL", "LDLCALC", "TRIG", "CHOLHDL", "LDLDIRECT" in the last 72 hours. Thyroid Function Tests: No results for input(s): "TSH", "T4TOTAL", "FREET4", "T3FREE", "THYROIDAB" in the last 72 hours.  Anemia Panel: Recent Labs    12/25/21 0812 12/26/21 0910  FERRITIN 222 283   Urine analysis:    Component Value Date/Time   COLORURINE YELLOW (A) 03/21/2019 1751   APPEARANCEUR Clear 10/11/2021 1150   LABSPEC 1.013 03/21/2019 1751   PHURINE 5.0 03/21/2019 1751   GLUCOSEU Negative 10/11/2021 1150   HGBUR NEGATIVE 03/21/2019 1751   BILIRUBINUR Negative 10/11/2021 1150   KETONESUR NEGATIVE 03/21/2019 1751   PROTEINUR Negative 10/11/2021 [place] 03/21/2019 1751   NITRITE Negative 10/11/2021 1150   NITRITE NEGATIVE 03/21/2019 1751   LEUKOCYTESUR Negative 10/11/2021 1150   LEUKOCYTESUR NEGATIVE 03/21/2019 1751   Sepsis Labs: '@LABRCNTIP'$ (procalcitonin:4,lacticidven:4)  Recent Results (from the past 240 hour(s))  Culture, blood (single)     Status: None   Collection Time: 12/21/21  9:41 PM   Specimen: BLOOD  Result Value Ref Range Status   Specimen Description BLOOD LEFT ARM  Final   Special Requests   Final    BOTTLES DRAWN AEROBIC AND ANAEROBIC Blood Culture adequate volume   Culture   Final    NO GROWTH 5 DAYS Performed at [facility], [address] [place], [place] [address]    Report Status 12/26/2021 FINAL  Final  SARS Coronavirus 2 by RT PCR (hospital order, performed in [facility] hospital lab) *cepheid single result test* Anterior Nasal Swab     Status: Abnormal   Collection Time: 12/21/21  9:41 PM   Specimen: Anterior Nasal Swab  Result Value Ref Range Status   SARS Coronavirus 2 by RT PCR POSITIVE (A) NEGATIVE Final    Comment: (NOTE) SARS-CoV-2 target nucleic acids are DETECTED  SARS-CoV-2 RNA is  generally detectable in upper respiratory specimens  during the acute phase of infection.  Positive results are indicative  of the presence of the identified virus, but do not rule out bacterial infection or co-infection with other pathogens not detected by the test.  Clinical correlation with patient history and  other diagnostic information is necessary to determine patient infection status.  The expected result is negative.  Fact Sheet for Patients:   https://www.patel.info/   Fact Sheet for Healthcare Providers:   https://hall.com/    This test is not yet approved or cleared by the [place] FDA and  has been authorized for detection and/or diagnosis of SARS-CoV-2 by FDA under an Emergency Use Authorization (EUA).  This EUA will remain in effect (meaning this test can be used) for the duration of  the COVID-19 declaration under Section 564(b)(1)  of the Act, 21 U.S.C. section 360-bbb-3(b)(1), unless the authorization is terminated or revoked sooner.   Performed at [facility], [address] [place], [place] [address]   Culture, blood (routine x 2)     Status: None (Preliminary result)   Collection Time: 12/21/21  9:43 PM   Specimen: BLOOD  Result Value Ref Range Status   Specimen Description BLOOD LEFT HAND  Final   Special Requests   Final    BOTTLES DRAWN AEROBIC AND ANAEROBIC Blood Culture adequate volume   Culture   Final    NO GROWTH 4 DAYS Performed at [facility], [address], [place],  [address]    Report Status PENDING  Incomplete         Radiology Studies: ECHOCARDIOGRAM COMPLETE  Result Date: 12/25/2021    ECHOCARDIOGRAM REPORT   Patient Name:   [facility] Robin Searing Date of Exam: 12/25/2021 Medical Rec #:  [id number]  Height:       64.0 in Accession #:    [id number]     Weight:       185.0 lb Date of Birth:  Apr 13, 1946      BSA:          1.893 m Patient Age:    41 years       BP:            156/77 mmHg Patient Gender: F              HR:           106 bpm. Exam Location:  [facility] Procedure: 2D Echo, Cardiac Doppler and Color Doppler Indications:     CHF-acute diastolic I71.24  History:         Patient has prior history of Echocardiogram examinations, most                  recent 11/22/2019. Signs/Symptoms:Shortness of Breath; Risk                  Factors:Hypertension and Dyslipidemia. Breast cancer.  Sonographer:     Sherrie Sport Referring Phys:  [id number] SENDIL K [facility] Diagnosing Phys: Nelva Bush MD IMPRESSIONS  1. Left ventricular ejection fraction, by estimation, is 60 to 65%. The left ventricle has normal function. The left ventricle has no regional wall motion abnormalities. There is mild left ventricular hypertrophy. Left ventricular diastolic parameters are consistent with Grade I diastolic dysfunction (impaired relaxation). Elevated left atrial pressure.  2. Right ventricular systolic function is normal. The right ventricular size is normal. Tricuspid regurgitation signal is inadequate for assessing PA pressure.  3. Left atrial size was mildly dilated.  4. Right atrial size was mild to moderately dilated.  5. The mitral valve is abnormal. Trivial mitral valve regurgitation. No evidence of mitral stenosis.  6. The aortic valve is tricuspid. There is mild thickening of the aortic valve. Aortic valve regurgitation is not visualized. No aortic stenosis is present. FINDINGS  Left Ventricle: Left ventricular ejection fraction, by estimation, is 60 to 65%. The left ventricle has normal function. The left ventricle has no regional wall motion abnormalities. The left ventricular internal cavity size was normal in size. There is  mild left ventricular hypertrophy. Left ventricular diastolic parameters are consistent with Grade I diastolic dysfunction (impaired relaxation). Elevated left atrial pressure. Right Ventricle: The right ventricular size is normal. No increase in right ventricular wall  thickness. Right ventricular systolic function is normal. Tricuspid regurgitation signal is inadequate for assessing PA pressure. Left Atrium: Left atrial size was mildly dilated. Right Atrium: Right atrial size was mild to moderately dilated. Pericardium: There is no evidence of pericardial effusion. Mitral Valve: The mitral valve is abnormal. There is mild calcification of the mitral valve leaflet(s). Mild to moderate mitral annular calcification. Trivial mitral valve regurgitation. No evidence of mitral valve stenosis. MV peak gradient, 10.9 mmHg. The mean mitral valve gradient is 4.0 mmHg. Tricuspid Valve: The tricuspid valve is not well visualized. Tricuspid valve regurgitation is trivial. Aortic Valve: The aortic valve is tricuspid. There is mild thickening of the aortic valve. Aortic valve regurgitation is not visualized. No aortic stenosis is present. Aortic valve mean gradient measures 3.0 mmHg. Aortic valve peak gradient measures 5.1 mmHg. Aortic valve area, by VTI measures 2.66 cm. Pulmonic Valve: The pulmonic valve was grossly normal. Pulmonic valve regurgitation is not visualized. No evidence of pulmonic stenosis. Aorta: The aortic root is normal in size and structure. Pulmonary  Artery: The pulmonary artery is of normal size. Venous: The inferior vena cava was not well visualized. IAS/Shunts: The interatrial septum was not well visualized.  LEFT VENTRICLE PLAX 2D LVIDd:         4.40 cm   Diastology LVIDs:         2.90 cm   LV e' medial:    6.31 cm/s LV PW:         1.20 cm   LV E/e' medial:  17.9 LV IVS:        1.20 cm   LV e' lateral:   7.72 cm/s LVOT diam:     2.00 cm   LV E/e' lateral: 14.6 LV SV:         67 LV SV Index:   36 LVOT Area:     3.14 cm  RIGHT VENTRICLE RV Basal diam:  3.50 cm RV S prime:     16.60 cm/s TAPSE (M-mode): 2.6 cm LEFT ATRIUM              Index        RIGHT ATRIUM           Index LA diam:        4.00 cm  2.11 cm/m   RA Area:     23.70 cm LA Vol (A2C):   107.0 ml 56.54 ml/m   RA Volume:   77.20 ml  40.79 ml/m LA Vol (A4C):   35.7 ml  18.86 ml/m LA Biplane Vol: 68.7 ml  36.30 ml/m  AORTIC VALVE AV Area (Vmax):    2.47 cm AV Area (Vmean):   2.35 cm AV Area (VTI):     2.66 cm AV Vmax:           113.00 cm/s AV Vmean:          77.300 cm/s AV VTI:            0.253 m AV Peak Grad:      5.1 mmHg AV Mean Grad:      3.0 mmHg LVOT Vmax:         88.80 cm/s LVOT Vmean:        57.900 cm/s LVOT VTI:          0.214 m LVOT/AV VTI ratio: 0.85  AORTA Ao Root diam: 3.00 cm MITRAL VALVE MV Area (PHT): 3.17 cm     SHUNTS MV Area VTI:   1.67 cm     Systemic VTI:  0.21 m MV Peak grad:  10.9 mmHg    Systemic Diam: 2.00 cm MV Mean grad:  4.0 mmHg MV Vmax:       1.65 m/s MV Vmean:      97.7 cm/s MV Decel Time: 239 msec MV E velocity: 113.00 cm/s MV A velocity: 142.00 cm/s MV E/A ratio:  0.80 Harrell Gave End MD Electronically signed by Nelva Bush MD Signature Date/Time: 12/25/2021/1:41:15 PM    Final             LOS: 4 days       Emeterio Reeve, DO [facility] 12/26/2021, 3:02 PM   Staff may message me via secure chat in [place]  but this may not receive immediate response,  please page for urgent matters!  If 7PM-7AM, please contact night-coverage [email or web address]  Dictation software was used to generate the above note. Typos may occur and escape review, as with typed/written notes. Please contact Dr Sheppard Coil directly for clarity if needed.

## 2021-12-26 NOTE — Progress Notes (Signed)
SATURATION QUALIFICATIONS: (This note is used to comply with regulatory documentation for home oxygen)  Patient saturation on 3LNC while at rest= 98  Patient Saturations on 3 Liters of oxygen while Ambulating = 86% Patient Saturations on 4 Liters of oxygen while Ambulating = 92%

## 2021-12-26 NOTE — Hospital Course (Addendum)
76 year old female with past medical history of diabetes mellitus type 2, stage IV chronic kidney disease, breast cancer and obesity who presented to the emergency room on 9/1 with shortness of breath and cough for several weeks  09/01: patient found to be hypoxic requiring 2 L nasal cannula and found to have severe sepsis from COVID-pneumonia.  Patient started on IV steroids, antibiotics and IV Remdisivir.   After several days, diarrhea is a little improved, however patient still continues to complain of dizziness, dyspnea on exertion.  Seen by PT noting limited ambulation due to hypoxia and dyspnea. Patient's breathing and diarrhea have since improved.  She is still quite deconditioned and has increased dyspnea and heart rate with activity.  Severe sepsis resolved as of 09/04 09/06: still mild hypoxic with increased O2 requirement to 4 L up from 2 L, needing TOC arrange HH, possible home O2.  09/07: Still requiring 4L oxygen this morning, 2L later in the morning.   Consultants:  none  Procedures: none      ASSESSMENT & PLAN:   Active Problems:   Acute respiratory failure with hypoxia (HCC)   Pneumonia due to COVID-19 virus   Acute kidney injury superimposed on stage IV chronic kidney disease (HCC)   Essential hypertension   Generalized weakness   Diverticulosis large intestine w/o perforation or abscess w/o bleeding   Anemia   DM II (diabetes mellitus, type II), controlled (HCC)   GERD (gastroesophageal reflux disease)   Obesity (BMI 30-39.9)  Acute respiratory failure with hypoxia (HCC) - IMPROVED Pneumonia due to COVID-19 virus Cough Remdesivir course completed  Steroid taper on discharge  Supplemental oxygen as needed to keep goal o2 sats above 94%.   with ambulation, O2 sats dropped below 90% on 3 L.  Discharge on O2 Antitussives and antiviral regimen as above  Severe sepsis ([place]) - RESOLVED Pt meets severe sepsis criteria on admission, given tachypnea, tachycardia and  pneumonia source plus lactic acidosis and acute kidney injury with creatinine greater than 2.   Completed 5-day course of Remdisivir and antibiotics (given due to elevated procalcitonin) today.  Generalized weakness Seen by PT and OT and patient quite deconditioned as well as easily dyspneic.  For now they are recommending short-term rehab however, condition may improve  continued physical therapy Home health arranged per TOC  Acute kidney injury superimposed on stage IV chronic kidney disease ([place]) Steadily improving. Secondary to diarrhea and dehydration.  Follow BMP  Diarrhea due to COVID-19 Started following admission.  Patient complains of significant diarrhea and abdominal discomfort.  CT notes diverticulosis only.  See below.  Diarrhea likely from [place] has since improved.   Treated with Imodium, continue prn  Acute on chronic diastolic CHF (congestive heart failure) ([place]) Noted to have some worsening shortness of breath and BNP checked and found to be mildly elevated on 9/2.  No previous history of heart failure.  Echocardiogram checked and patient found to have grade 1 diastolic dysfunction.   Status post 2 doses of Lasix.  Did help with breathing.  DM II (diabetes mellitus, type II), controlled ([place]) Glycemic protocol.   Essential hypertension Blood pressures have been trending upward, likely in part may be due to fluid retention from heart failure.  As above.  GERD (gastroesophageal reflux disease) IV PPI.  Anemia Anemia is chronic  We will follow / type and screen.  Additional eval if hemoglobin drops   Obesity (BMI 30-39.9) BMI greater than 30  Diverticulosis large intestine w/o perforation or abscess w/o  bleeding Following severe diarrhea, CT of abdomen/pelvis done noting severe sigmoid diverticulosis with tethering of proximal and distal sigmoid colon within the left lower quadrant which may reflect postinflammatory changes from remote diverticulitis.  No  inflammatory stranding or fluid collections identified.  Diarrhea reaffirmed to be from [place].   monitor      DVT prophylaxis: heparin Pertinent IV fluids/nutrition: n/a Central lines / invasive devices: n/a  Code Status: FULL Family Communication: none at this time  Disposition: inpatient for now  [facility] needs: needs home health Barriers to discharge / significant pending items: increased O2 requirement today, arrange HH, may need O2?

## 2021-12-26 NOTE — Plan of Care (Signed)
  Problem: Education: Goal: Ability to describe self-care measures that may prevent or decrease complications (Diabetes Survival Skills Education) will improve Outcome: Progressing Goal: Individualized Educational Video(s) Outcome: Progressing   Problem: Coping: Goal: Ability to adjust to condition or change in health will improve Outcome: Progressing   Problem: Fluid Volume: Goal: Ability to maintain a balanced intake and output will improve Outcome: Progressing   Problem: Health Behavior/Discharge Planning: Goal: Ability to identify and utilize available resources and services will improve Outcome: Progressing Goal: Ability to manage health-related needs will improve Outcome: Progressing   Problem: Metabolic: Goal: Ability to maintain appropriate glucose levels will improve Outcome: Progressing   Problem: Nutritional: Goal: Maintenance of adequate nutrition will improve Outcome: Progressing Goal: Progress toward achieving an optimal weight will improve Outcome: Progressing   Problem: Skin Integrity: Goal: Risk for impaired skin integrity will decrease Outcome: Progressing   Problem: Tissue Perfusion: Goal: Adequacy of tissue perfusion will improve Outcome: Progressing   Problem: Education: Goal: Knowledge of risk factors and measures for prevention of condition will improve Outcome: Progressing   Problem: Coping: Goal: Psychosocial and spiritual needs will be supported Outcome: Progressing   Problem: Respiratory: Goal: Will maintain a patent airway Outcome: Progressing Goal: Complications related to the disease process, condition or treatment will be avoided or minimized Outcome: Progressing   Problem: Education: Goal: Knowledge of General Education information will improve Description: Including pain rating scale, medication(s)/side effects and non-pharmacologic comfort measures Outcome: Progressing   Problem: Health Behavior/Discharge Planning: Goal:  Ability to manage health-related needs will improve Outcome: Progressing   Problem: Clinical Measurements: Goal: Ability to maintain clinical measurements within normal limits will improve Outcome: Progressing Goal: Will remain free from infection Outcome: Progressing Goal: Diagnostic test results will improve Outcome: Progressing Goal: Respiratory complications will improve Outcome: Progressing Goal: Cardiovascular complication will be avoided Outcome: Progressing   Problem: Activity: Goal: Risk for activity intolerance will decrease Outcome: Progressing   Problem: Nutrition: Goal: Adequate nutrition will be maintained Outcome: Progressing   Problem: Coping: Goal: Level of anxiety will decrease Outcome: Progressing   Problem: Elimination: Goal: Will not experience complications related to bowel motility Outcome: Progressing Goal: Will not experience complications related to urinary retention Outcome: Progressing   Problem: Pain Managment: Goal: General experience of comfort will improve Outcome: Progressing   Problem: Safety: Goal: Ability to remain free from injury will improve Outcome: Progressing   Problem: Skin Integrity: Goal: Risk for impaired skin integrity will decrease Outcome: Progressing   

## 2021-12-26 NOTE — Progress Notes (Signed)
Occupational Therapy Treatment Patient Details Name: Kara [name] MRN: [id number] DOB: 29-Nov-1945 Today's Date: 12/26/2021   History of present illness Kara [name] is a 56yoF who comes to [facility] after 2 weeks of cough, found to be (+) COVID 19. PTA pt was a limited community AMB with [facility], lives with husband, 2 falls in past 12 months.   OT comments  Upon entering session, patient resting in bed and agreeable to OT. Pt requesting to use BSC. She completed bed mobility with Mod I, functional transfer from EOB <> BSC with Min guard, and completed peri care in standing with Min guard. Pt required increased time to complete all activities this date 2/2 decreased activity tolerance and SOB. Patient was educated on energy conservation techniques with handout provided explaining the 4 P's (plan, prioritize, pace, position) as well as strategies for how to conserve energy for specific ADL tasks. Pt is making progress toward goal completion. D/C recommendation remains appropriate. OT will continue to follow acutely.    Recommendations for follow up therapy are one component of a multi-disciplinary discharge planning process, led by the attending physician.  Recommendations may be updated based on patient status, additional functional criteria and insurance authorization.    Follow Up Recommendations  Home health OT    Assistance Recommended at Discharge Intermittent Supervision/Assistance  Patient can return home with the following  A little help with walking and/or transfers;Assistance with cooking/housework;Assist for transportation;Help with stairs or ramp for entrance;A little help with bathing/dressing/bathroom   Equipment Recommendations  Tub/shower bench;BSC/3in1    Recommendations for Other Services      Precautions / Restrictions Precautions Precautions: Fall Restrictions Weight Bearing Restrictions: No       Mobility Bed Mobility Overal bed mobility: Needs Assistance Bed Mobility:  Supine to Sit, Sit to Supine     Supine to sit: HOB elevated, Modified independent (Device/Increase time) Sit to supine: Modified independent (Device/Increase time), HOB elevated        Transfers Overall transfer level: Needs assistance Equipment used: Rolling walker (2 wheels), None Transfers: Bed to chair/wheelchair/BSC, Sit to/from Stand Sit to Stand: Min guard Stand pivot transfers: Min guard         General transfer comment: no AD to transfer to [facility], however, required BUE support with use of RW to get back to EOB     Balance Overall balance assessment: Needs assistance Sitting-balance support: Feet supported Sitting balance-Leahy Scale: Good     Standing balance support: During functional activity, No upper extremity supported Standing balance-Leahy Scale: Poor Standing balance comment: Pt demonstrating need for grabbing at railing for steadying during BSC to bed transfer. Unable to complete transfer without UE assistance.                           ADL either performed or assessed with clinical judgement   ADL Overall ADL's : Needs assistance/impaired                         Toilet Transfer: BSC/3in1;Min guard;Stand-pivot;Rolling walker (2 wheels) Toilet Transfer Details (indicate cue type and reason): no AD to transfer to [facility], however, required BUE support with use of RW to get back to EOB Toileting- Clothing Manipulation and Hygiene: Min guard;Sit to/from stand;Minimal assistance Toileting - Clothing Manipulation Details (indicate cue type and reason): Pt completed peri care in standing with Min guard, Min A for thoroughness of posterior hygiene  Extremity/Trunk Assessment Upper Extremity Assessment Upper Extremity Assessment: Generalized weakness   Lower Extremity Assessment Lower Extremity Assessment: Generalized weakness   Cervical / Trunk Assessment Cervical / Trunk Assessment: Normal    Vision Baseline Vision/History:  1 Wears glasses Patient Visual Report: No change from baseline     Perception     Praxis      Cognition Arousal/Alertness: Awake/alert Behavior During Therapy: WFL for tasks assessed/performed Overall Cognitive Status: Within Functional Limits for tasks assessed                                          Exercises      Shoulder Instructions       General Comments SpO2 93% sitting EOB on 5L O2 via [place], 90% in supine at end of session on 5L, slowly improving with rest and pursed lip breathing    Pertinent Vitals/ Pain       Pain Assessment Pain Assessment: No/denies pain  Home Living                                          Prior Functioning/Environment              Frequency  Min 2X/week        Progress Toward Goals  OT Goals(current goals can now be found in the care plan section)  Progress towards OT goals: Progressing toward goals  Acute Rehab OT Goals Patient Stated Goal: to go home OT Goal Formulation: With patient Time For Goal Achievement: 01/07/22 Potential to Achieve Goals: Good  Plan Discharge plan remains appropriate;Frequency remains appropriate    Co-evaluation                 AM-PAC OT "6 Clicks" Daily Activity     Outcome Measure   Help from another person eating meals?: None Help from another person taking care of personal grooming?: A Little Help from another person toileting, which includes using toliet, bedpan, or urinal?: A Little Help from another person bathing (including washing, rinsing, drying)?: A Lot Help from another person to put on and taking off regular upper body clothing?: None Help from another person to put on and taking off regular lower body clothing?: A Lot 6 Click Score: 18    End of Session Equipment Utilized During Treatment: Gait belt;Rolling walker (2 wheels);Oxygen  OT Visit Diagnosis: Muscle weakness (generalized) (M62.81);Unsteadiness on feet (R26.81);Dizziness  and giddiness (R42)   Activity Tolerance Patient limited by fatigue   Patient Left in bed;with call bell/phone within reach;with bed alarm set   Nurse Communication Mobility status        Time: 2956-2130 OT Time Calculation (min): 23 min  Charges: OT General Charges $OT Visit: 1 Visit OT Treatments $Self Care/Home Management : 23-37 mins  [facility] MS, OTR/L ascom [phone]  12/26/21, 2:27 PM

## 2021-12-27 DIAGNOSIS — A419 Sepsis, unspecified organism: Secondary | ICD-10-CM | POA: Diagnosis not present

## 2021-12-27 DIAGNOSIS — R6520 Severe sepsis without septic shock: Secondary | ICD-10-CM | POA: Diagnosis not present

## 2021-12-27 LAB — CBC WITH DIFFERENTIAL/PLATELET
Abs Immature Granulocytes: 0.12 10*3/uL — ABNORMAL HIGH (ref 0.00–0.07)
Basophils Absolute: 0 10*3/uL (ref 0.0–0.1)
Basophils Relative: 0 %
Eosinophils Absolute: 0 10*3/uL (ref 0.0–0.5)
Eosinophils Relative: 0 %
HCT: 30.6 % — ABNORMAL LOW (ref 36.0–46.0)
Hemoglobin: 9.7 g/dL — ABNORMAL LOW (ref 12.0–15.0)
Immature Granulocytes: 2 %
Lymphocytes Relative: 13 %
Lymphs Abs: 0.8 10*3/uL (ref 0.7–4.0)
MCH: 27.3 pg (ref 26.0–34.0)
MCHC: 31.7 g/dL (ref 30.0–36.0)
MCV: 86.2 fL (ref 80.0–100.0)
Monocytes Absolute: 0.6 10*3/uL (ref 0.1–1.0)
Monocytes Relative: 9 %
Neutro Abs: 4.8 10*3/uL (ref 1.7–7.7)
Neutrophils Relative %: 76 %
Platelets: 198 10*3/uL (ref 150–400)
RBC: 3.55 MIL/uL — ABNORMAL LOW (ref 3.87–5.11)
RDW: 16.1 % — ABNORMAL HIGH (ref 11.5–15.5)
WBC: 6.4 10*3/uL (ref 4.0–10.5)
nRBC: 0 % (ref 0.0–0.2)

## 2021-12-27 LAB — URINALYSIS, ROUTINE W REFLEX MICROSCOPIC
Bilirubin Urine: NEGATIVE
Glucose, UA: NEGATIVE mg/dL
Hgb urine dipstick: NEGATIVE
Ketones, ur: 5 mg/dL — AB
Leukocytes,Ua: NEGATIVE
Nitrite: NEGATIVE
Protein, ur: NEGATIVE mg/dL
Specific Gravity, Urine: 1.012 (ref 1.005–1.030)
pH: 5 (ref 5.0–8.0)

## 2021-12-27 LAB — COMPREHENSIVE METABOLIC PANEL
ALT: 37 U/L (ref 0–44)
AST: 34 U/L (ref 15–41)
Albumin: 2.6 g/dL — ABNORMAL LOW (ref 3.5–5.0)
Alkaline Phosphatase: 59 U/L (ref 38–126)
Anion gap: 11 (ref 5–15)
BUN: 32 mg/dL — ABNORMAL HIGH (ref 8–23)
CO2: 26 mmol/L (ref 22–32)
Calcium: 8.8 mg/dL — ABNORMAL LOW (ref 8.9–10.3)
Chloride: 106 mmol/L (ref 98–111)
Creatinine, Ser: 1.53 mg/dL — ABNORMAL HIGH (ref 0.44–1.00)
GFR, Estimated: 35 mL/min — ABNORMAL LOW (ref >60)
Glucose, Bld: 161 mg/dL — ABNORMAL HIGH (ref 70–99)
Potassium: 3.4 mmol/L — ABNORMAL LOW (ref 3.5–5.1)
Sodium: 143 mmol/L (ref 135–145)
Total Bilirubin: 0.5 mg/dL (ref 0.3–1.2)
Total Protein: 5.9 g/dL — ABNORMAL LOW (ref 6.5–8.1)

## 2021-12-27 LAB — CULTURE, BLOOD (ROUTINE X 2)
Culture: NO GROWTH
Special Requests: ADEQUATE

## 2021-12-27 LAB — RESP PANEL BY RT-PCR (FLU A&B, COVID) ARPGX2
Influenza A by PCR: NEGATIVE
Influenza B by PCR: NEGATIVE
SARS Coronavirus 2 by RT PCR: POSITIVE — AB

## 2021-12-27 LAB — FERRITIN: Ferritin: 220 ng/mL (ref 11–307)

## 2021-12-27 LAB — C-REACTIVE PROTEIN: CRP: 0.8 mg/dL (ref <1.0)

## 2021-12-27 LAB — MAGNESIUM: Magnesium: 2 mg/dL (ref 1.7–2.4)

## 2021-12-27 LAB — GLUCOSE, CAPILLARY
Glucose-Capillary: 143 mg/dL — ABNORMAL HIGH (ref 70–99)
Glucose-Capillary: 250 mg/dL — ABNORMAL HIGH (ref 70–99)

## 2021-12-27 LAB — PHOSPHORUS: Phosphorus: 3.3 mg/dL (ref 2.5–4.6)

## 2021-12-27 LAB — D-DIMER, QUANTITATIVE: D-Dimer, Quant: 1.53 ug/mL-FEU — ABNORMAL HIGH (ref 0.00–0.50)

## 2021-12-27 MED ORDER — ZINC SULFATE 220 (50 ZN) MG PO CAPS: 220.0000 mg | ORAL_CAPSULE | Freq: Every day | ORAL | 0 refills | Status: DC

## 2021-12-27 MED ORDER — POTASSIUM CHLORIDE CRYS ER 20 MEQ PO TBCR
40.0000 meq | EXTENDED_RELEASE_TABLET | Freq: Once | ORAL | Status: AC
  Administered 2021-12-27: 40 meq via ORAL
  Filled 2021-12-27: qty 2

## 2021-12-27 MED ORDER — HYDROCOD POLI-CHLORPHE POLI ER 10-8 MG/5ML PO SUER: 5.0000 mL | Freq: Two times a day (BID) | ORAL | 0 refills | Status: DC | PRN

## 2021-12-27 MED ORDER — PREDNISONE 10 MG PO TABS
ORAL_TABLET | ORAL | 0 refills | Status: AC
Start: 2021-12-27 — End: 2021-12-31

## 2021-12-27 NOTE — Plan of Care (Signed)
Problem: Education: Goal: Ability to describe self-care measures that may prevent or decrease complications (Diabetes Survival Skills Education) will improve 12/27/2021 1352 by Alferd Apa, RN Outcome: Adequate for Discharge 12/27/2021 0752 by Alferd Apa, RN Outcome: Progressing Goal: Individualized Educational Video(s) 12/27/2021 1352 by Alferd Apa, RN Outcome: Adequate for Discharge 12/27/2021 0752 by Alferd Apa, RN Outcome: Progressing   Problem: Coping: Goal: Ability to adjust to condition or change in health will improve 12/27/2021 1352 by Alferd Apa, RN Outcome: Adequate for Discharge 12/27/2021 0752 by Alferd Apa, RN Outcome: Progressing   Problem: Fluid Volume: Goal: Ability to maintain a balanced intake and output will improve 12/27/2021 1352 by Alferd Apa, RN Outcome: Adequate for Discharge 12/27/2021 0752 by Alferd Apa, RN Outcome: Progressing   Problem: Health Behavior/Discharge Planning: Goal: Ability to identify and utilize available resources and services will improve 12/27/2021 1352 by Alferd Apa, RN Outcome: Adequate for Discharge 12/27/2021 0752 by Alferd Apa, RN Outcome: Progressing Goal: Ability to manage health-related needs will improve 12/27/2021 1352 by Alferd Apa, RN Outcome: Adequate for Discharge 12/27/2021 0752 by Alferd Apa, RN Outcome: Progressing   Problem: Metabolic: Goal: Ability to maintain appropriate glucose levels will improve 12/27/2021 1352 by Alferd Apa, RN Outcome: Adequate for Discharge 12/27/2021 0752 by Alferd Apa, RN Outcome: Progressing   Problem: Nutritional: Goal: Maintenance of adequate nutrition will improve 12/27/2021 1352 by Alferd Apa, RN Outcome: Adequate for Discharge 12/27/2021 0752 by Alferd Apa, RN Outcome: Progressing Goal: Progress toward achieving an optimal weight will improve 12/27/2021 1352 by Alferd Apa, RN Outcome: Adequate for Discharge 12/27/2021 0752 by Alferd Apa,  RN Outcome: Progressing   Problem: Skin Integrity: Goal: Risk for impaired skin integrity will decrease 12/27/2021 1352 by Alferd Apa, RN Outcome: Adequate for Discharge 12/27/2021 0752 by Alferd Apa, RN Outcome: Progressing   Problem: Tissue Perfusion: Goal: Adequacy of tissue perfusion will improve 12/27/2021 1352 by Alferd Apa, RN Outcome: Adequate for Discharge 12/27/2021 0752 by Alferd Apa, RN Outcome: Progressing   Problem: Education: Goal: Knowledge of risk factors and measures for prevention of condition will improve 12/27/2021 1352 by Alferd Apa, RN Outcome: Adequate for Discharge 12/27/2021 0752 by Alferd Apa, RN Outcome: Progressing   Problem: Coping: Goal: Psychosocial and spiritual needs will be supported 12/27/2021 1352 by Alferd Apa, RN Outcome: Adequate for Discharge 12/27/2021 0752 by Alferd Apa, RN Outcome: Progressing   Problem: Respiratory: Goal: Will maintain a patent airway 12/27/2021 1352 by Alferd Apa, RN Outcome: Adequate for Discharge 12/27/2021 0752 by Alferd Apa, RN Outcome: Progressing Goal: Complications related to the disease process, condition or treatment will be avoided or minimized 12/27/2021 1352 by Alferd Apa, RN Outcome: Adequate for Discharge 12/27/2021 0752 by Alferd Apa, RN Outcome: Progressing   Problem: Education: Goal: Knowledge of General Education information will improve Description: Including pain rating scale, medication(s)/side effects and non-pharmacologic comfort measures 12/27/2021 1352 by Alferd Apa, RN Outcome: Adequate for Discharge 12/27/2021 0752 by Alferd Apa, RN Outcome: Progressing   Problem: Health Behavior/Discharge Planning: Goal: Ability to manage health-related needs will improve 12/27/2021 1352 by Alferd Apa, RN Outcome: Adequate for Discharge 12/27/2021 0752 by Alferd Apa, RN Outcome: Progressing   Problem: Clinical Measurements: Goal: Ability to maintain clinical  measurements within normal limits will improve 12/27/2021 1352 by Drew Lips, Alfredo Batty, RN Outcome: Adequate for Discharge  12/27/2021 0752 by Alferd Apa, RN Outcome: Progressing Goal: Will remain free from infection 12/27/2021 1352 by Alferd Apa, RN Outcome: Adequate for Discharge 12/27/2021 [phone] by Alferd Apa, RN Outcome: Progressing Goal: Diagnostic test results will improve 12/27/2021 1352 by Alferd Apa, RN Outcome: Adequate for Discharge 12/27/2021 0752 by Alferd Apa, RN Outcome: Progressing Goal: Respiratory complications will improve 12/27/2021 1352 by Alferd Apa, RN Outcome: Adequate for Discharge 12/27/2021 0752 by Alferd Apa, RN Outcome: Progressing Goal: Cardiovascular complication will be avoided 12/27/2021 1352 by Alferd Apa, RN Outcome: Adequate for Discharge 12/27/2021 0752 by Alferd Apa, RN Outcome: Progressing   Problem: Activity: Goal: Risk for activity intolerance will decrease 12/27/2021 1352 by Alferd Apa, RN Outcome: Adequate for Discharge 12/27/2021 [phone] by Alferd Apa, RN Outcome: Progressing   Problem: Nutrition: Goal: Adequate nutrition will be maintained 12/27/2021 1352 by Alferd Apa, RN Outcome: Adequate for Discharge 12/27/2021 0752 by Alferd Apa, RN Outcome: Progressing   Problem: Coping: Goal: Level of anxiety will decrease 12/27/2021 1352 by Alferd Apa, RN Outcome: Adequate for Discharge 12/27/2021 0752 by Alferd Apa, RN Outcome: Progressing   Problem: Elimination: Goal: Will not experience complications related to bowel motility 12/27/2021 1352 by Alferd Apa, RN Outcome: Adequate for Discharge 12/27/2021 0752 by Alferd Apa, RN Outcome: Progressing Goal: Will not experience complications related to urinary retention 12/27/2021 1352 by Alferd Apa, RN Outcome: Adequate for Discharge 12/27/2021 0752 by Alferd Apa, RN Outcome: Progressing   Problem: Pain Managment: Goal: General experience of comfort will  improve 12/27/2021 1352 by Alferd Apa, RN Outcome: Adequate for Discharge 12/27/2021 0752 by Alferd Apa, RN Outcome: Progressing   Problem: Safety: Goal: Ability to remain free from injury will improve 12/27/2021 1352 by Alferd Apa, RN Outcome: Adequate for Discharge 12/27/2021 0752 by Alferd Apa, RN Outcome: Progressing   Problem: Skin Integrity: Goal: Risk for impaired skin integrity will decrease 12/27/2021 1352 by Alferd Apa, RN Outcome: Adequate for Discharge 12/27/2021 0752 by Alferd Apa, RN Outcome: Progressing

## 2021-12-27 NOTE — Progress Notes (Signed)
Patient is not able to walk the distance required to go the bathroom, or he/she is unable to safely negotiate stairs required to access the bathroom.  A 3in1 BSC will alleviate this problem  

## 2021-12-27 NOTE — Discharge Summary (Signed)
Physician Discharge Summary   Patient: Kara [name] MRN: [id number]  DOB: [birth year]   Admit:     Date of Admission: 12/21/2021 Admitted from: home   Discharge: Date of discharge: 12/27/21 Disposition: Home health Condition at discharge: fair  CODE STATUS: FULL     Discharge Physician: Emeterio Reeve, DO [facility]     PCP: Jonetta Osgood, NP  Recommendations for Outpatient Follow-up:  Follow up with PCP Jonetta Osgood, NP in 2-4 weeks Please obtain labs/tests: CBC, BMP in 2-4 weeks Please follow up on the following pending results: none Discharge Instructions     Diet - low sodium heart healthy   Complete by: As directed    Increase activity slowly   Complete by: As directed          Discharge Diagnoses: Active Problems:   Acute respiratory failure with hypoxia (HCC)   Pneumonia due to COVID-19 virus   Acute kidney injury superimposed on stage IV chronic kidney disease (HCC)   Essential hypertension   Generalized weakness   Diverticulosis large intestine w/o perforation or abscess w/o bleeding   Anemia   DM II (diabetes mellitus, type II), controlled (HCC)   GERD (gastroesophageal reflux disease)   Obesity (BMI 30-39.9)       Hospital Course: 76 year old female with past medical history of diabetes mellitus type 2, stage IV chronic kidney disease, breast cancer and obesity who presented to the emergency room on 9/1 with shortness of breath and cough for several weeks  09/01: patient found to be hypoxic requiring 2 L nasal cannula and found to have severe sepsis from COVID-pneumonia.  Patient started on IV steroids, antibiotics and IV Remdisivir.   After several days, diarrhea is a little improved, however patient still continues to complain of dizziness, dyspnea on exertion.  Seen by PT noting limited ambulation due to hypoxia and dyspnea. Patient's breathing and diarrhea have since improved.  She is still quite deconditioned and has  increased dyspnea and heart rate with activity.  Severe sepsis resolved as of 09/04 09/06: still mild hypoxic with increased O2 requirement to 4 L up from 2 L, needing TOC arrange HH, possible home O2.    Consultants:  none   Procedures: none           ASSESSMENT & PLAN:   Active Problems:   Acute respiratory failure with hypoxia (HCC)   Pneumonia due to COVID-19 virus   Acute kidney injury superimposed on stage IV chronic kidney disease (HCC)   Essential hypertension   Generalized weakness   Diverticulosis large intestine w/o perforation or abscess w/o bleeding   Anemia   DM II (diabetes mellitus, type II), controlled (HCC)   GERD (gastroesophageal reflux disease)   Obesity (BMI 30-39.9)   Acute respiratory failure with hypoxia (HCC) - IMPROVED Pneumonia due to COVID-19 virus Cough Continue Remdisivir and steroids to complete course  Supplemental oxygen as needed to keep goal o2 sats above 94%.   Improved, currently 95% on 3 L, however with ambulation, O2 sats dropped below 90% on 3 L.  Some of this is related to some mild acute diastolic heart failure, see below ?discharge on O2? Pending walk test  Antitussives and antiviral regimen as above   Severe sepsis (HCC) Pt meets severe sepsis criteria on admission, given tachypnea, tachycardia and pneumonia source plus lactic acidosis and acute kidney injury with creatinine greater than 2.   Complete 5-day course of Remdisivir and antibiotics (given due to elevated procalcitonin) today.  Generalized weakness Seen by PT and OT and patient quite deconditioned as well as easily dyspneic.  For now they are recommending short-term rehab however, condition may improve  continued physical therapy Home health arranged per TOC   Acute kidney injury superimposed on stage IV chronic kidney disease ([place]) Steadily improving. Secondary to diarrhea and dehydration.  Follow BMP   Diarrhea due to COVID-19 Started following admission.   Patient complains of significant diarrhea and abdominal discomfort.  CT notes diverticulosis only.  See below.  Diarrhea likely from [place] has since improved.   Treated with Imodium, continue prn   Acute on chronic diastolic CHF (congestive heart failure) ([place]) Noted to have some worsening shortness of breath and BNP checked and found to be mildly elevated on 9/2.  No previous history of heart failure.  Echocardiogram checked and patient found to have grade 1 diastolic dysfunction.   Status post 2 doses of Lasix.  Did help with breathing.   DM II (diabetes mellitus, type II), controlled ([place]) Glycemic protocol.    Essential hypertension Blood pressures have been trending upward, likely in part may be due to fluid retention from heart failure.  As above.   GERD (gastroesophageal reflux disease) IV PPI.   Anemia Anemia is chronic  We will follow / type and screen.  Additional eval if hemoglobin drops    Obesity (BMI 30-39.9) BMI greater than 30   Diverticulosis large intestine w/o perforation or abscess w/o bleeding Following severe diarrhea, CT of abdomen/pelvis done noting severe sigmoid diverticulosis with tethering of proximal and distal sigmoid colon within the left lower quadrant which may reflect postinflammatory changes from remote diverticulitis.  No inflammatory stranding or fluid collections identified.  Diarrhea reaffirmed to be from [place].   monitor     Discharge Instructions  Allergies as of 12/27/2021       Reactions   Amoxicillin Nausea And Vomiting        Medication List     STOP taking these medications    doxycycline 100 MG capsule Commonly known as: MONODOX   methocarbamol 500 MG tablet Commonly known as: ROBAXIN   mupirocin ointment 2 % Commonly known as: BACTROBAN       TAKE these medications    aspirin EC 81 MG tablet Take 81 mg by mouth daily.   chlorpheniramine-HYDROcodone 10-8 MG/5ML Commonly known as: TUSSIONEX Take 5 mLs by mouth  every 12 (twelve) hours as needed for cough.   cholecalciferol 1000 units tablet Commonly known as: VITAMIN D Take 2,000 Units by mouth daily.   clobetasol ointment 0.05 % Commonly known as: TEMOVATE Apply to affected areas twice daily. Avoid applying to face, groin, and axilla. Use as directed. Risk of skin atrophy with long-term use reviewed.   Eylea 2 MG/0.05ML Soln Generic drug: Aflibercept   Ferrous Gluconate 324 (37.5 Fe) MG Tabs Take 324 mg by mouth daily.   FISH OIL OMEGA-3 PO Take 25 mg by mouth daily.   fluocinolone 0.01 % external solution Commonly known as: SYNALAR Apply to affected areas ears and scalp 1-2 times a day as needed for itch.   furosemide 20 MG tablet Commonly known as: LASIX Take 20 mg by mouth 2 (two) times daily.   hydrOXYzine 10 MG tablet Commonly known as: ATARAX TAKE 1-3 TABLETS BY MOUTH EVERY NIGHT AS NEEDED FOR ITCH.   ketoconazole 2 % shampoo Commonly known as: NIZORAL Massage into scalp, let sit several minutes before rinsing. Use 2-3x/wk.   Levemir FlexPen 100 UNIT/ML FlexPen  Generic drug: insulin detemir INJECT UP TO 75 UNITS UNDER THE SKIN DAILY, TITRATE AS NEEDED.   lisinopril 10 MG tablet Commonly known as: ZESTRIL Take 10 mg by mouth daily.   multivitamin tablet Take 1 tablet by mouth daily at 12 noon.   NovoLOG FlexPen 100 UNIT/ML FlexPen Generic drug: insulin aspart INJECT [place] TWICE DAILY AS DIRECTED PER SLIDING SCALE. MAX DAILY DOSE IS 30 UNITS. DX E11.65.   pantoprazole 40 MG tablet Commonly known as: PROTONIX TAKE 1 TABLET BY MOUTH EVERY DAY   predniSONE 10 MG tablet Commonly known as: DELTASONE Take 4 tablets (40 mg total) by mouth daily with breakfast for 1 day, THEN 3 tablets (30 mg total) daily with breakfast for 1 day, THEN 2 tablets (20 mg total) daily with breakfast for 1 day, THEN 1 tablet (10 mg total) daily with breakfast for 1 day. Start taking on: December 27, 2021   pregabalin 100 MG capsule Commonly  known as: LYRICA TAKE 1 CAPSULE BY MOUTH TWICE A DAY   Probiotic Acidophilus Caps Take 1 capsule by mouth daily.   rosuvastatin 20 MG tablet Commonly known as: CRESTOR Take 1 tablet (20 mg total) by mouth daily.   Shingrix injection Generic drug: Zoster Vaccine Adjuvanted   tacrolimus 0.1 % ointment Commonly known as: PROTOPIC Apply topically 2 (two) times daily. Apply to body folds, face, groin as needed.   tamoxifen 20 MG tablet Commonly known as: NOLVADEX Take 1 tablet (20 mg total) by mouth daily.   traMADol-acetaminophen 37.5-325 MG tablet Commonly known as: ULTRACET Take 1 tablet by mouth 2 (two) times daily as needed for moderate pain.   triamcinolone 0.025 % cream Commonly known as: KENALOG Apply 1 application. topically 2 (two) times daily.   Trulicity 1.5 AS/3.4HD Sopn Generic drug: Dulaglutide Inject 1.5 mg into the skin once a week.   verapamil 40 MG tablet Commonly known as: CALAN Take 1 tablet (40 mg total) by mouth 2 (two) times daily.   zinc sulfate 220 (50 Zn) MG capsule Take 1 capsule (220 mg total) by mouth daily. Start taking on: December 28, 2021               Durable Medical Equipment  (From admission, onward)           Start     Ordered   12/27/21 1247  DME Oxygen  Once       Question Answer Comment  Length of Need 6 Months   Mode or (Route) Nasal cannula   Liters per Minute 4   Oxygen delivery system Gas      12/27/21 1248   12/27/21 1240  For home use only DME Walker rolling  Once       Question Answer Comment  Walker: With 5 Inch Wheels   Patient needs a walker to treat with the following condition Impaired mobility      12/27/21 1240   12/27/21 1240  For home use only DME Bedside commode  Once       Question:  Patient needs a bedside commode to treat with the following condition  Answer:  Impaired mobility   12/27/21 1240              Allergies  Allergen Reactions   Amoxicillin Nausea And Vomiting      Subjective: Patient is visibly upset, close to tears, stating that she wants to go home today.  She denies shortness of breath except on exertion, she states she feels able  to take care of herself at home   Discharge Exam: BP (!) 168/84 (BP Location: Left Arm)   Pulse 100   Temp 97.9 F (36.6 C)   Resp 16   Ht '5\' 4"'$  (1.626 m)   Wt 83.9 kg   SpO2 93%   BMI 31.76 kg/m  General: Pt is alert, awake, not in acute distress Cardiovascular: RRR, S1/S2 +, no rubs, no gallops Respiratory: CTA bilaterally, + wheezing, no rhonchi Abdominal: Soft, NT, ND, bowel sounds + Extremities: no edema, no cyanosis     The results of significant diagnostics from this hospitalization (including imaging, microbiology, ancillary and laboratory) are listed below for reference.     Microbiology: Recent Results (from the past 240 hour(s))  Culture, blood (single)     Status: None   Collection Time: 12/21/21  9:41 PM   Specimen: BLOOD  Result Value Ref Range Status   Specimen Description BLOOD LEFT ARM  Final   Special Requests   Final    BOTTLES DRAWN AEROBIC AND ANAEROBIC Blood Culture adequate volume   Culture   Final    NO GROWTH 5 DAYS Performed at [facility], [address] [place], [place] [address]    Report Status 12/26/2021 FINAL  Final  SARS Coronavirus 2 by RT PCR (hospital order, performed in [facility] hospital lab) *cepheid single result test* Anterior Nasal Swab     Status: Abnormal   Collection Time: 12/21/21  9:41 PM   Specimen: Anterior Nasal Swab  Result Value Ref Range Status   SARS Coronavirus 2 by RT PCR POSITIVE (A) NEGATIVE Final    Comment: (NOTE) SARS-CoV-2 target nucleic acids are DETECTED  SARS-CoV-2 RNA is generally detectable in upper respiratory specimens  during the acute phase of infection.  Positive results are indicative  of the presence of the identified virus, but do not rule out bacterial infection or co-infection with other pathogens  not detected by the test.  Clinical correlation with patient history and  other diagnostic information is necessary to determine patient infection status.  The expected result is negative.  Fact Sheet for Patients:   https://www.patel.info/   Fact Sheet for Healthcare Providers:   https://hall.com/    This test is not yet approved or cleared by the [place] FDA and  has been authorized for detection and/or diagnosis of SARS-CoV-2 by FDA under an Emergency Use Authorization (EUA).  This EUA will remain in effect (meaning this test can be used) for the duration of  the COVID-19 declaration under Section 564(b)(1)  of the Act, 21 U.S.C. section 360-bbb-3(b)(1), unless the authorization is terminated or revoked sooner.   Performed at [facility], [address] [place], [place] [address]   Culture, blood (routine x 2)     Status: None   Collection Time: 12/21/21  9:43 PM   Specimen: BLOOD  Result Value Ref Range Status   Specimen Description BLOOD LEFT HAND  Final   Special Requests   Final    BOTTLES DRAWN AEROBIC AND ANAEROBIC Blood Culture adequate volume   Culture   Final    NO GROWTH 5 DAYS Performed at [facility], [address] [place], [place] [address]    Report Status 12/27/2021 FINAL  Final  Resp Panel by RT-PCR (Flu A&B, Covid)     Status: Abnormal   Collection Time: 12/27/21  4:28 AM  Result Value Ref Range Status   SARS Coronavirus 2 by RT PCR POSITIVE (A) NEGATIVE Final  Comment: (NOTE) SARS-CoV-2 target nucleic acids are DETECTED.  The SARS-CoV-2 RNA is generally detectable in upper respiratory specimens during the acute phase of infection. Positive results are indicative of the presence of the identified virus, but do not rule out bacterial infection or co-infection with other pathogens not detected by the test. Clinical correlation with patient history and other diagnostic  information is necessary to determine patient infection status. The expected result is Negative.  Fact Sheet for Patients: EntrepreneurPulse.com.au  Fact Sheet for Healthcare Providers: IncredibleEmployment.be  This test is not yet approved or cleared by the [place] FDA and  has been authorized for detection and/or diagnosis of SARS-CoV-2 by FDA under an Emergency Use Authorization (EUA).  This EUA will remain in effect (meaning this test can be used) for the duration of  the COVID-19 declaration under Section 564(b)(1) of the A ct, 21 U.S.C. section 360bbb-3(b)(1), unless the authorization is terminated or revoked sooner.     Influenza A by PCR NEGATIVE NEGATIVE Final   Influenza B by PCR NEGATIVE NEGATIVE Final    Comment: (NOTE) The Xpert Xpress SARS-CoV-2/FLU/RSV plus assay is intended as an aid in the diagnosis of influenza from Nasopharyngeal swab specimens and should not be used as a sole basis for treatment. Nasal washings and aspirates are unacceptable for Xpert Xpress SARS-CoV-2/FLU/RSV testing.  Fact Sheet for Patients: EntrepreneurPulse.com.au  Fact Sheet for Healthcare Providers: IncredibleEmployment.be  This test is not yet approved or cleared by the [place] FDA and has been authorized for detection and/or diagnosis of SARS-CoV-2 by FDA under an Emergency Use Authorization (EUA). This EUA will remain in effect (meaning this test can be used) for the duration of the COVID-19 declaration under Section 564(b)(1) of the Act, 21 U.S.C. section 360bbb-3(b)(1), unless the authorization is terminated or revoked.  Performed at [facility], [address] [place], [place] [address]      Labs: BNP (last 3 results) Recent Labs    12/22/21 0430  BNP 607.3*   Basic Metabolic Panel: Recent Labs  Lab 12/23/21 0514 12/24/21 0458 12/25/21 0812 12/26/21 0910  12/27/21 0612  NA 139 141 143 141 143  K 3.9 4.2 3.6 3.9 3.4*  CL 108 109 112* 109 106  CO2 '24 22 22 22 26  '$ GLUCOSE 196* 255* 193* 223* 161*  BUN 32* 34* 40* 36* 32*  CREATININE 1.61* 1.53* 1.58* 1.58* 1.53*  CALCIUM 8.6* 8.8* 8.7* 8.8* 8.8*  MG 1.7 1.9 2.0 2.0 2.0  PHOS 3.3 3.4 3.8 3.0 3.3   Liver Function Tests: Recent Labs  Lab 12/23/21 0514 12/24/21 0458 12/25/21 0812 12/26/21 0910 12/27/21 0612  AST 33 32 33 46* 34  ALT '23 27 28 '$ 38 37  ALKPHOS 65 72 62 61 59  BILITOT 0.7 0.8 0.7 0.9 0.5  PROT 6.2* 6.9 5.8* 6.1* 5.9*  ALBUMIN 2.3* 2.6* 2.3* 2.5* 2.6*   No results for input(s): "LIPASE", "AMYLASE" in the last 168 hours. No results for input(s): "AMMONIA" in the last 168 hours. CBC: Recent Labs  Lab 12/23/21 0514 12/24/21 0458 12/25/21 0812 12/26/21 0910 12/27/21 0612  WBC 13.5* 10.4 7.4 9.7 6.4  NEUTROABS 12.1* 9.3* 5.8 7.8* 4.8  HGB 9.3* 10.9* 9.1* 9.9* 9.7*  HCT 29.2* 35.0* 28.8* 31.6* 30.6*  MCV 87.2 86.8 85.7 87.3 86.2  PLT 181 249 207 197 198   Cardiac Enzymes: No results for input(s): "CKTOTAL", "CKMB", "CKMBINDEX", "TROPONINI" in the last 168 hours. BNP: Invalid input(s): "POCBNP" CBG: Recent Labs  Lab 12/26/21  1148 12/26/21 1651 12/26/21 2128 12/27/21 0825 12/27/21 1309  GLUCAP 112* 305* 161* 143* 250*   D-Dimer Recent Labs    12/26/21 0910 12/27/21 0612  DDIMER 1.85* 1.53*   Hgb A1c No results for input(s): "HGBA1C" in the last 72 hours. Lipid Profile No results for input(s): "CHOL", "HDL", "LDLCALC", "TRIG", "CHOLHDL", "LDLDIRECT" in the last 72 hours. Thyroid function studies No results for input(s): "TSH", "T4TOTAL", "T3FREE", "THYROIDAB" in the last 72 hours.  Invalid input(s): "FREET3" Anemia work up Recent Labs    12/26/21 0910 12/27/21 0612  FERRITIN 283 220   Urinalysis    Component Value Date/Time   COLORURINE YELLOW (A) 12/27/2021 0343   APPEARANCEUR CLEAR (A) 12/27/2021 0343   APPEARANCEUR Clear 10/11/2021  1150   LABSPEC 1.012 12/27/2021 0343   PHURINE 5.0 12/27/2021 0343   GLUCOSEU NEGATIVE 12/27/2021 0343   HGBUR NEGATIVE 12/27/2021 0343   BILIRUBINUR NEGATIVE 12/27/2021 0343   BILIRUBINUR Negative 10/11/2021 1150   KETONESUR 5 (A) 12/27/2021 0343   PROTEINUR NEGATIVE 12/27/2021 0343   NITRITE NEGATIVE 12/27/2021 0343   LEUKOCYTESUR NEGATIVE 12/27/2021 0343   Sepsis Labs Recent Labs  Lab 12/24/21 0458 12/25/21 0812 12/26/21 0910 12/27/21 0612  WBC 10.4 7.4 9.7 6.4   Microbiology Recent Results (from the past 240 hour(s))  Culture, blood (single)     Status: None   Collection Time: 12/21/21  9:41 PM   Specimen: BLOOD  Result Value Ref Range Status   Specimen Description BLOOD LEFT ARM  Final   Special Requests   Final    BOTTLES DRAWN AEROBIC AND ANAEROBIC Blood Culture adequate volume   Culture   Final    NO GROWTH 5 DAYS Performed at [facility], [address] [place], [place] [address]    Report Status 12/26/2021 FINAL  Final  SARS Coronavirus 2 by RT PCR (hospital order, performed in [facility] hospital lab) *cepheid single result test* Anterior Nasal Swab     Status: Abnormal   Collection Time: 12/21/21  9:41 PM   Specimen: Anterior Nasal Swab  Result Value Ref Range Status   SARS Coronavirus 2 by RT PCR POSITIVE (A) NEGATIVE Final    Comment: (NOTE) SARS-CoV-2 target nucleic acids are DETECTED  SARS-CoV-2 RNA is generally detectable in upper respiratory specimens  during the acute phase of infection.  Positive results are indicative  of the presence of the identified virus, but do not rule out bacterial infection or co-infection with other pathogens not detected by the test.  Clinical correlation with patient history and  other diagnostic information is necessary to determine patient infection status.  The expected result is negative.  Fact Sheet for Patients:   https://www.patel.info/   Fact Sheet for Healthcare Providers:    https://hall.com/    This test is not yet approved or cleared by the [place] FDA and  has been authorized for detection and/or diagnosis of SARS-CoV-2 by FDA under an Emergency Use Authorization (EUA).  This EUA will remain in effect (meaning this test can be used) for the duration of  the COVID-19 declaration under Section 564(b)(1)  of the Act, 21 U.S.C. section 360-bbb-3(b)(1), unless the authorization is terminated or revoked sooner.   Performed at [facility], [address] [place], [place] [address]   Culture, blood (routine x 2)     Status: None   Collection Time: 12/21/21  9:43 PM   Specimen: BLOOD  Result Value Ref Range Status   Specimen Description BLOOD LEFT HAND  Final   Special Requests   Final    BOTTLES DRAWN AEROBIC AND ANAEROBIC Blood Culture adequate volume   Culture   Final    NO GROWTH 5 DAYS Performed at [facility], [address] [place], [place] [address]    Report Status 12/27/2021 FINAL  Final  Resp Panel by RT-PCR (Flu A&B, Covid)     Status: Abnormal   Collection Time: 12/27/21  4:28 AM  Result Value Ref Range Status   SARS Coronavirus 2 by RT PCR POSITIVE (A) NEGATIVE Final    Comment: (NOTE) SARS-CoV-2 target nucleic acids are DETECTED.  The SARS-CoV-2 RNA is generally detectable in upper respiratory specimens during the acute phase of infection. Positive results are indicative of the presence of the identified virus, but do not rule out bacterial infection or co-infection with other pathogens not detected by the test. Clinical correlation with patient history and other diagnostic information is necessary to determine patient infection status. The expected result is Negative.  Fact Sheet for Patients: EntrepreneurPulse.com.au  Fact Sheet for Healthcare Providers: IncredibleEmployment.be  This test is not yet approved or cleared by the [place]  FDA and  has been authorized for detection and/or diagnosis of SARS-CoV-2 by FDA under an Emergency Use Authorization (EUA).  This EUA will remain in effect (meaning this test can be used) for the duration of  the COVID-19 declaration under Section 564(b)(1) of the A ct, 21 U.S.C. section 360bbb-3(b)(1), unless the authorization is terminated or revoked sooner.     Influenza A by PCR NEGATIVE NEGATIVE Final   Influenza B by PCR NEGATIVE NEGATIVE Final    Comment: (NOTE) The Xpert Xpress SARS-CoV-2/FLU/RSV plus assay is intended as an aid in the diagnosis of influenza from Nasopharyngeal swab specimens and should not be used as a sole basis for treatment. Nasal washings and aspirates are unacceptable for Xpert Xpress SARS-CoV-2/FLU/RSV testing.  Fact Sheet for Patients: EntrepreneurPulse.com.au  Fact Sheet for Healthcare Providers: IncredibleEmployment.be  This test is not yet approved or cleared by the [place] FDA and has been authorized for detection and/or diagnosis of SARS-CoV-2 by FDA under an Emergency Use Authorization (EUA). This EUA will remain in effect (meaning this test can be used) for the duration of the COVID-19 declaration under Section 564(b)(1) of the Act, 21 U.S.C. section 360bbb-3(b)(1), unless the authorization is terminated or revoked.  Performed at [facility], [address] [place], [place] [address]    Imaging ECHOCARDIOGRAM COMPLETE  Result Date: 12/25/2021    ECHOCARDIOGRAM REPORT   Patient Name:   Jadaya Robin Searing Date of Exam: 12/25/2021 Medical Rec #:  [id number]      Height:       64.0 in Accession #:    [id number]     Weight:       185.0 lb Date of Birth:  [birth year]      BSA:          1.893 m Patient Age:    57 years       BP:           156/77 mmHg Patient Gender: F              HR:           106 bpm. Exam Location:  [facility] Procedure: 2D Echo, Cardiac Doppler and Color Doppler Indications:      CHF-acute diastolic G25.42  History:         Patient has prior history of Echocardiogram examinations,  most                  recent 11/22/2019. Signs/Symptoms:Shortness of Breath; Risk                  Factors:Hypertension and Dyslipidemia. Breast cancer.  Sonographer:     Sherrie Sport Referring Phys:  [id number] SENDIL K [facility] Diagnosing Phys: Nelva Bush MD IMPRESSIONS  1. Left ventricular ejection fraction, by estimation, is 60 to 65%. The left ventricle has normal function. The left ventricle has no regional wall motion abnormalities. There is mild left ventricular hypertrophy. Left ventricular diastolic parameters are consistent with Grade I diastolic dysfunction (impaired relaxation). Elevated left atrial pressure.  2. Right ventricular systolic function is normal. The right ventricular size is normal. Tricuspid regurgitation signal is inadequate for assessing PA pressure.  3. Left atrial size was mildly dilated.  4. Right atrial size was mild to moderately dilated.  5. The mitral valve is abnormal. Trivial mitral valve regurgitation. No evidence of mitral stenosis.  6. The aortic valve is tricuspid. There is mild thickening of the aortic valve. Aortic valve regurgitation is not visualized. No aortic stenosis is present. FINDINGS  Left Ventricle: Left ventricular ejection fraction, by estimation, is 60 to 65%. The left ventricle has normal function. The left ventricle has no regional wall motion abnormalities. The left ventricular internal cavity size was normal in size. There is  mild left ventricular hypertrophy. Left ventricular diastolic parameters are consistent with Grade I diastolic dysfunction (impaired relaxation). Elevated left atrial pressure. Right Ventricle: The right ventricular size is normal. No increase in right ventricular wall thickness. Right ventricular systolic function is normal. Tricuspid regurgitation signal is inadequate for assessing PA pressure. Left Atrium: Left atrial size was mildly  dilated. Right Atrium: Right atrial size was mild to moderately dilated. Pericardium: There is no evidence of pericardial effusion. Mitral Valve: The mitral valve is abnormal. There is mild calcification of the mitral valve leaflet(s). Mild to moderate mitral annular calcification. Trivial mitral valve regurgitation. No evidence of mitral valve stenosis. MV peak gradient, 10.9 mmHg. The mean mitral valve gradient is 4.0 mmHg. Tricuspid Valve: The tricuspid valve is not well visualized. Tricuspid valve regurgitation is trivial. Aortic Valve: The aortic valve is tricuspid. There is mild thickening of the aortic valve. Aortic valve regurgitation is not visualized. No aortic stenosis is present. Aortic valve mean gradient measures 3.0 mmHg. Aortic valve peak gradient measures 5.1 mmHg. Aortic valve area, by VTI measures 2.66 cm. Pulmonic Valve: The pulmonic valve was grossly normal. Pulmonic valve regurgitation is not visualized. No evidence of pulmonic stenosis. Aorta: The aortic root is normal in size and structure. Pulmonary Artery: The pulmonary artery is of normal size. Venous: The inferior vena cava was not well visualized. IAS/Shunts: The interatrial septum was not well visualized.  LEFT VENTRICLE PLAX 2D LVIDd:         4.40 cm   Diastology LVIDs:         2.90 cm   LV e' medial:    6.31 cm/s LV PW:         1.20 cm   LV E/e' medial:  17.9 LV IVS:        1.20 cm   LV e' lateral:   7.72 cm/s LVOT diam:     2.00 cm   LV E/e' lateral: 14.6 LV SV:         67 LV SV Index:   36 LVOT Area:     3.14 cm  RIGHT VENTRICLE RV Basal diam:  3.50 cm RV S prime:     16.60 cm/s TAPSE (M-mode): 2.6 cm LEFT ATRIUM              Index        RIGHT ATRIUM           Index LA diam:        4.00 cm  2.11 cm/m   RA Area:     23.70 cm LA Vol (A2C):   107.0 ml 56.54 ml/m  RA Volume:   77.20 ml  40.79 ml/m LA Vol (A4C):   35.7 ml  18.86 ml/m LA Biplane Vol: 68.7 ml  36.30 ml/m  AORTIC VALVE AV Area (Vmax):    2.47 cm AV Area (Vmean):    2.35 cm AV Area (VTI):     2.66 cm AV Vmax:           113.00 cm/s AV Vmean:          77.300 cm/s AV VTI:            0.253 m AV Peak Grad:      5.1 mmHg AV Mean Grad:      3.0 mmHg LVOT Vmax:         88.80 cm/s LVOT Vmean:        57.900 cm/s LVOT VTI:          0.214 m LVOT/AV VTI ratio: 0.85  AORTA Ao Root diam: 3.00 cm MITRAL VALVE MV Area (PHT): 3.17 cm     SHUNTS MV Area VTI:   1.67 cm     Systemic VTI:  0.21 m MV Peak grad:  10.9 mmHg    Systemic Diam: 2.00 cm MV Mean grad:  4.0 mmHg MV Vmax:       1.65 m/s MV Vmean:      97.7 cm/s MV Decel Time: 239 msec MV E velocity: 113.00 cm/s MV A velocity: 142.00 cm/s MV E/A ratio:  0.80 Harrell Gave End MD Electronically signed by Nelva Bush MD Signature Date/Time: 12/25/2021/1:41:15 PM    Final       Time coordinating discharge: over 30 minutes  SIGNED:  Emeterio Reeve DO [facility]

## 2021-12-27 NOTE — Care Management Important Message (Signed)
Important Message  Patient Details  Name: Kara [name] MRN: [id number] Date of Birth: October 09, 1945   Medicare Important Message Given:  Yes  Patient is in an isolation room so I reviewed the Important Message from Medicare by phone [phone]) and she is aware of her rights. I wished her well and thanked her for her time.    [name] 12/27/2021, 11:25 AM

## 2021-12-27 NOTE — Plan of Care (Signed)
  Problem: Education: Goal: Ability to describe self-care measures that may prevent or decrease complications (Diabetes Survival Skills Education) will improve Outcome: Progressing Goal: Individualized Educational Video(s) Outcome: Progressing   Problem: Coping: Goal: Ability to adjust to condition or change in health will improve Outcome: Progressing   Problem: Fluid Volume: Goal: Ability to maintain a balanced intake and output will improve Outcome: Progressing   Problem: Health Behavior/Discharge Planning: Goal: Ability to identify and utilize available resources and services will improve Outcome: Progressing Goal: Ability to manage health-related needs will improve Outcome: Progressing   Problem: Metabolic: Goal: Ability to maintain appropriate glucose levels will improve Outcome: Progressing   Problem: Nutritional: Goal: Maintenance of adequate nutrition will improve Outcome: Progressing Goal: Progress toward achieving an optimal weight will improve Outcome: Progressing   Problem: Skin Integrity: Goal: Risk for impaired skin integrity will decrease Outcome: Progressing   Problem: Tissue Perfusion: Goal: Adequacy of tissue perfusion will improve Outcome: Progressing   Problem: Education: Goal: Knowledge of risk factors and measures for prevention of condition will improve Outcome: Progressing   Problem: Coping: Goal: Psychosocial and spiritual needs will be supported Outcome: Progressing   Problem: Respiratory: Goal: Will maintain a patent airway Outcome: Progressing Goal: Complications related to the disease process, condition or treatment will be avoided or minimized Outcome: Progressing   Problem: Education: Goal: Knowledge of General Education information will improve Description: Including pain rating scale, medication(s)/side effects and non-pharmacologic comfort measures Outcome: Progressing   Problem: Health Behavior/Discharge Planning: Goal:  Ability to manage health-related needs will improve Outcome: Progressing   Problem: Clinical Measurements: Goal: Ability to maintain clinical measurements within normal limits will improve Outcome: Progressing Goal: Will remain free from infection Outcome: Progressing Goal: Diagnostic test results will improve Outcome: Progressing Goal: Respiratory complications will improve Outcome: Progressing Goal: Cardiovascular complication will be avoided Outcome: Progressing   Problem: Activity: Goal: Risk for activity intolerance will decrease Outcome: Progressing   Problem: Nutrition: Goal: Adequate nutrition will be maintained Outcome: Progressing   Problem: Coping: Goal: Level of anxiety will decrease Outcome: Progressing   Problem: Elimination: Goal: Will not experience complications related to bowel motility Outcome: Progressing Goal: Will not experience complications related to urinary retention Outcome: Progressing   Problem: Pain Managment: Goal: General experience of comfort will improve Outcome: Progressing   Problem: Safety: Goal: Ability to remain free from injury will improve Outcome: Progressing   Problem: Skin Integrity: Goal: Risk for impaired skin integrity will decrease Outcome: Progressing   

## 2021-12-27 NOTE — Progress Notes (Signed)
The patient lives at home with her spouse that has dementia, the son lives next door, She will need a rolling walker and a 3 in 1 as well as Oxygen, Suanne Marker is aware with [employer] and will deliver to the room [facility] will accept herfor [facility] services

## 2021-12-28 LAB — URINE CULTURE: Culture: NO GROWTH

## 2021-12-31 ENCOUNTER — Telehealth (INDEPENDENT_AMBULATORY_CARE_PROVIDER_SITE_OTHER): Payer: Medicare HMO | Admitting: Nurse Practitioner

## 2021-12-31 ENCOUNTER — Encounter: Payer: Self-pay | Admitting: Nurse Practitioner

## 2021-12-31 VITALS — HR 88 | Resp 16 | Ht 64.0 in

## 2021-12-31 DIAGNOSIS — Z09 Encounter for follow-up examination after completed treatment for conditions other than malignant neoplasm: Secondary | ICD-10-CM

## 2021-12-31 DIAGNOSIS — U071 COVID-19: Secondary | ICD-10-CM | POA: Diagnosis not present

## 2021-12-31 DIAGNOSIS — Z760 Encounter for issue of repeat prescription: Secondary | ICD-10-CM

## 2021-12-31 DIAGNOSIS — N184 Chronic kidney disease, stage 4 (severe): Secondary | ICD-10-CM | POA: Diagnosis not present

## 2021-12-31 DIAGNOSIS — E1165 Type 2 diabetes mellitus with hyperglycemia: Secondary | ICD-10-CM

## 2021-12-31 DIAGNOSIS — J1282 Pneumonia due to coronavirus disease 2019: Secondary | ICD-10-CM

## 2021-12-31 DIAGNOSIS — E1142 Type 2 diabetes mellitus with diabetic polyneuropathy: Secondary | ICD-10-CM

## 2021-12-31 DIAGNOSIS — R531 Weakness: Secondary | ICD-10-CM

## 2021-12-31 NOTE — Progress Notes (Signed)
[facility] [place], [place] [address]  Internal MEDICINE  Telephone Visit  Patient Name: Kara [name]  [id number]  [id number]  Date of Service: 12/31/2021  I connected with the patient at 1545 by telephone and verified the patients identity using two identifiers.   I discussed the limitations, risks, security and privacy concerns of performing an evaluation and management service by telephone and the availability of in person appointments. I also discussed with the patient that there may be a patient responsible charge related to the service.  The patient expressed understanding and agrees to proceed.    Chief Complaint  Patient presents with   Telephone Assessment    Hosp f/u    Telephone Screen    [id number]   Diabetes    Pt glucose 66 in morning and she start LEVEMIR 30 units and novolog  sliding scale    Fatigue    HPI Kara [name] presents for a telehealth virtual visit for hospital F/U after admission for COVID pneumonia and severe sepsis.   -- hypoglycemic episode - 66 on 30 units of levemir, wants to restart trulicity -- hold verapamil for now --has not restarted taking lyrica for her neuropathy yet --on supplemental oxygen, and still needs it but wants to come off the oxygen as soon as she can.   Hospital Course: 76 year old female with past medical history of diabetes mellitus type 2, stage IV chronic kidney disease, breast cancer and obesity who presented to the emergency room on 9/1 with shortness of breath and cough for several weeks  09/01: patient found to be hypoxic requiring 2 L nasal cannula and found to have severe sepsis from COVID-pneumonia.  Patient started on IV steroids, antibiotics and IV Remdisivir.   After several days, diarrhea is a little improved, however patient still continues to complain of dizziness, dyspnea on exertion.  Seen by PT noting limited ambulation due to hypoxia and dyspnea. Patient's breathing and diarrhea have since  improved.  She is still quite deconditioned and has increased dyspnea and heart rate with activity.  Severe sepsis resolved as of 09/04 09/06: still mild hypoxic with increased O2 requirement to 4 L up from 2 L, needing TOC arrange HH, possible home O2.   Discharge Diagnoses: Active Problems:   Acute respiratory failure with hypoxia (HCC)   Pneumonia due to COVID-19 virus   Acute kidney injury superimposed on stage IV chronic kidney disease (HCC)   Essential hypertension   Generalized weakness   Diverticulosis large intestine w/o perforation or abscess w/o bleeding   Anemia   DM II (diabetes mellitus, type II), controlled (HCC)   GERD (gastroesophageal reflux disease)   Obesity (BMI 30-39.9)  Current Medication: Outpatient Encounter Medications as of 12/31/2021  Medication Sig Note   Aflibercept (EYLEA) 2 MG/0.05ML SOLN  12/22/2021: Every 3 months   aspirin EC 81 MG tablet Take 81 mg by mouth daily.     chlorpheniramine-HYDROcodone (TUSSIONEX) 10-8 MG/5ML Take 5 mLs by mouth every 12 (twelve) hours as needed for cough.    cholecalciferol (VITAMIN D) 1000 UNITS tablet Take 2,000 Units by mouth daily.     clobetasol ointment (TEMOVATE) 0.05 % Apply to affected areas twice daily. Avoid applying to face, groin, and axilla. Use as directed. Risk of skin atrophy with long-term use reviewed.    Dulaglutide (TRULICITY) 1.5 VQ/0.0VL SOPN Inject 1.5 mg into the skin once a week.    Ferrous Gluconate 324 (37.5 Fe) MG TABS Take 324 mg by mouth daily.  fluocinolone (SYNALAR) 0.01 % external solution Apply to affected areas ears and scalp 1-2 times a day as needed for itch.    furosemide (LASIX) 20 MG tablet Take 20 mg by mouth 2 (two) times daily.    hydrOXYzine (ATARAX/VISTARIL) 10 MG tablet TAKE 1-3 TABLETS BY MOUTH EVERY NIGHT AS NEEDED FOR ITCH.    insulin detemir (LEVEMIR FLEXPEN) 100 UNIT/ML FlexPen INJECT UP TO 75 UNITS UNDER THE SKIN DAILY, TITRATE AS NEEDED. 12/22/2021: 55 units    ketoconazole (NIZORAL) 2 % shampoo Massage into scalp, let sit several minutes before rinsing. Use 2-3x/wk.    Lactobacillus (PROBIOTIC ACIDOPHILUS) CAPS Take 1 capsule by mouth daily.    lisinopril (ZESTRIL) 10 MG tablet Take 10 mg by mouth daily.    Multiple Vitamin (MULTIVITAMIN) tablet Take 1 tablet by mouth daily at 12 noon.    Omega-3 Fatty Acids (FISH OIL OMEGA-3 PO) Take 25 mg by mouth daily.    pantoprazole (PROTONIX) 40 MG tablet TAKE 1 TABLET BY MOUTH EVERY DAY    [EXPIRED] predniSONE (DELTASONE) 10 MG tablet Take 4 tablets (40 mg total) by mouth daily with breakfast for 1 day, THEN 3 tablets (30 mg total) daily with breakfast for 1 day, THEN 2 tablets (20 mg total) daily with breakfast for 1 day, THEN 1 tablet (10 mg total) daily with breakfast for 1 day.    pregabalin (LYRICA) 100 MG capsule TAKE 1 CAPSULE BY MOUTH TWICE A DAY    rosuvastatin (CRESTOR) 20 MG tablet Take 1 tablet (20 mg total) by mouth daily.    SHINGRIX injection     tacrolimus (PROTOPIC) 0.1 % ointment Apply topically 2 (two) times daily. Apply to body folds, face, groin as needed.    tamoxifen (NOLVADEX) 20 MG tablet Take 1 tablet (20 mg total) by mouth daily.    traMADol-acetaminophen (ULTRACET) 37.5-325 MG tablet Take 1 tablet by mouth 2 (two) times daily as needed for moderate pain.    triamcinolone (KENALOG) 0.025 % cream Apply 1 application. topically 2 (two) times daily.    zinc sulfate 220 (50 Zn) MG capsule Take 1 capsule (220 mg total) by mouth daily.    [DISCONTINUED] NOVOLOG FLEXPEN 100 UNIT/ML FlexPen INJECT [place] TWICE DAILY AS DIRECTED PER SLIDING SCALE. MAX DAILY DOSE IS 30 UNITS. DX E11.65.    [DISCONTINUED] verapamil (CALAN) 40 MG tablet Take 1 tablet (40 mg total) by mouth 2 (two) times daily.    No facility-administered encounter medications on file as of 12/31/2021.    Surgical History: Past Surgical History:  Procedure Laterality Date   BILATERAL SALPINGECTOMY     BREAST BIOPSY Right 2014    neg- core   BREAST BIOPSY Right 12/21/2018   [place] bx, venus clip,  DUCTAL CARCINOMA IN SITU   BREAST BIOPSY Right 12/21/2018   [place] bx, ribbon clip,  FIBROEPITHELIAL PROLIFERATION WITH SCLEROSIS   BREAST BIOPSY Right 01/01/2019   Affirm bx "X" clip-path pending   BREAST DUCTAL SYSTEM EXCISION     BREAST EXCISIONAL BIOPSY Right 2014   neg   BREAST LUMPECTOMY Right 01/22/2019   path pending   CATARACT EXTRACTION     CHOLECYSTECTOMY     CYSTOSCOPY WITH STENT PLACEMENT Left 04/14/2015   Procedure: CYSTOSCOPY WITH STENT PLACEMENT;  Surgeon: Hollice Espy, MD;  Location: [facility] OR[facility];  Service: Urology;  Laterality: Left;   EXTRACORPOREAL SHOCK WAVE LITHOTRIPSY Left 09/29/2014   Procedure: EXTRACORPOREAL SHOCK WAVE LITHOTRIPSY (ESWL);  Surgeon: Hollice Espy, MD;  Location: [facility] OR[facility];  Service:  Urology;  Laterality: Left;   EYE SURGERY     bilateral cataract   IRRIGATION AND DEBRIDEMENT ABSCESS Right 03/24/2019   Procedure: IRRIGATION AND DEBRIDEMENT ABSCESS RIGHT BREAST;  Surgeon: Herbert Pun, MD;  Location: [facility] OR[facility];  Service: General;  Laterality: Right;   LAPAROSCOPIC OOPHERECTOMY Left    PARTIAL MASTECTOMY WITH NEEDLE LOCALIZATION Right 01/22/2019   Procedure: PARTIAL MASTECTOMY WITH NEEDLE LOCALIZATION;  Surgeon: Herbert Pun, MD;  Location: [facility];  Service: General;  Laterality: Right;   TONSILLECTOMY     TUBAL LIGATION     URETEROSCOPY WITH HOLMIUM LASER LITHOTRIPSY Left 04/14/2015   Procedure: URETEROSCOPY WITH HOLMIUM LASER LITHOTRIPSY;  Surgeon: Hollice Espy, MD;  Location: [facility];  Service: Urology;  Laterality: Left;    Medical History: Past Medical History:  Diagnosis Date   Anemia    Arthropathy    Benign neoplasm of breast    Breast cancer ([place])    Chest pain    Chronic kidney disease    30% kidney function   Diabetes (HCC)    Diabetic retinopathy ([place])    Dyspnea    Edema    History of kidney stones    Hyperlipemia    Hypersomnia with sleep  apnea    Hypertension    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HCC)    Lumbago    Osteoarthrosis    Ovarian failure    Personal history of radiation therapy    SOB (shortness of breath)     Family History: Family History  Problem Relation Age of Onset   Breast cancer Maternal Grandmother 88   Colon cancer Mother    Lung cancer Father     Social History   Socioeconomic History   Marital status: Married    Spouse name: Not on file   Number of children: Not on file   Years of education: Not on file   Highest education level: Not on file  Occupational History   Not on file  Tobacco Use   Smoking status: Former    Packs/day: 1.00    Years: 30.00    Total pack years: 30.00    Types: Cigarettes    Quit date: 04/11/1995    Years since quitting: 26.8   Smokeless tobacco: Never  Vaping Use   Vaping Use: Never used  Substance and Sexual Activity   Alcohol use: No    Alcohol/week: 0.0 standard drinks of alcohol   Drug use: No   Sexual activity: Not on file  Other Topics Concern   Not on file  Social History Narrative   Not on file   Social Determinants of Health   Financial Resource Strain: Low Risk  (10/03/2020)   Overall Financial Resource Strain (CARDIA)    Difficulty of Paying Living Expenses: Not very hard  Food Insecurity: Not on file  Transportation Needs: Not on file  Physical Activity: Not on file  Stress: Not on file  Social Connections: Not on file  Intimate Partner Violence: Not on file      Review of Systems  Constitutional:  Positive for activity change, appetite change and fatigue.  Respiratory:  Positive for cough and shortness of breath.   Cardiovascular: Negative.  Negative for chest pain and palpitations.  Gastrointestinal:  Positive for nausea.  Musculoskeletal:  Positive for gait problem (uses walker).  Neurological:  Positive for weakness.    Vital Signs: Pulse 88   Resp 16   Ht 5' 4"  (1.626 m)   SpO2 94%   BMI  31.76 kg/m     Observation/Objective: She is alert and oriented and engages in conversation appropriately. She does not sound as though she is in any acute distress over telephone call. Not able to do video call.    Assessment/Plan: 1. Hospital discharge follow-up Follow up, still very weak and fatigued but regaining her strength every day, still on supplemental oxygen after hospital discharge, will continue to monitor.  - CBC with Differential/Platelet - CMP14+EGFR  2. Pneumonia due to COVID-19 virus Treated in hospital, still on supplemental oxygen, was not able to eat for several days while in the hospital.   3. Controlled type 2 diabetes mellitus with hyperglycemia, without long-term current use of insulin ([place]) Need to repeat a1c when possible. May restart trulicity this week - Hgb A1C w/o eAG  4. Diabetic polyneuropathy associated with type 2 diabetes mellitus ([place]) Has not restarted her lyrica yet.  5. CKD (chronic kidney disease), stage IV ([place]) Repeat labs to monitor kidney function and potassium level.  - CMP14+EGFR   General Counseling: Aeron verbalizes understanding of the findings of today's phone visit and agrees with plan of treatment. I have discussed any further diagnostic evaluation that may be needed or ordered today. We also reviewed her medications today. she has been encouraged to call the office with any questions or concerns that should arise related to todays visit.  Return in about 4 weeks (around 01/28/2022) for F/U, Tashanti Dalporto PCP.   Orders Placed This Encounter  Procedures   CBC with Differential/Platelet   Hgb A1C w/o eAG   CMP14+EGFR    No orders of the defined types were placed in this encounter.   Time spent:20 Minutes Time spent with patient included reviewing progress notes, labs, imaging studies, and discussing plan for follow up.  [place] Controlled Substance Database was reviewed by me for overdose risk score (ORS) if appropriate.  This patient was seen  by Jonetta Osgood, FNP-C in collaboration with Dr. Clayborn Bigness as a part of collaborative care agreement.  Amro Winebarger R. Valetta Fuller, MSN, FNP-C Internal medicine

## 2022-01-01 ENCOUNTER — Telehealth: Payer: Self-pay

## 2022-01-01 NOTE — Telephone Encounter (Signed)
Kerry Hough [phone]) from [facility] called requesting verbal orders for  Nursing 1 x week for 4 weeks              1 x week for 7 weeks              1 PRN visit Also she requested a verbal order for PT Eval also  I gave verbal approval for the orders requested.

## 2022-01-08 ENCOUNTER — Telehealth: Payer: Self-pay

## 2022-01-08 NOTE — Telephone Encounter (Signed)
PHU [phone]) PT from [facility] called requesting verbal orders for PT   2 x a week for 4 weeks 1 x a week for 4 weeks  I gave [facility] verbal approval to do the PT

## 2022-01-09 ENCOUNTER — Encounter: Payer: Self-pay | Admitting: Nurse Practitioner

## 2022-01-09 ENCOUNTER — Telehealth (INDEPENDENT_AMBULATORY_CARE_PROVIDER_SITE_OTHER): Payer: Medicare HMO | Admitting: Nurse Practitioner

## 2022-01-09 VITALS — BP 115/53 | Resp 16 | Ht 63.0 in | Wt 183.0 lb

## 2022-01-09 DIAGNOSIS — L120 Bullous pemphigoid: Secondary | ICD-10-CM | POA: Insufficient documentation

## 2022-01-09 DIAGNOSIS — E1165 Type 2 diabetes mellitus with hyperglycemia: Secondary | ICD-10-CM | POA: Diagnosis not present

## 2022-01-09 DIAGNOSIS — I10 Essential (primary) hypertension: Secondary | ICD-10-CM

## 2022-01-09 NOTE — Progress Notes (Signed)
[facility] [place], [place] [address]  Internal MEDICINE  Telephone Visit  Patient Name: Kara [name]  [id number]  [id number]  Date of Service: 01/09/2022  I connected with the patient at 1215 by telephone and verified the patients identity using two identifiers.   I discussed the limitations, risks, security and privacy concerns of performing an evaluation and management service by telephone and the availability of in person appointments. I also discussed with the patient that there may be a patient responsible charge related to the service.  The patient expressed understanding and agrees to proceed.    Chief Complaint  Patient presents with   Telephone Assessment    [phone]   Telephone Screen   Follow-up    HPI Kara [name] presents for a telehealth virtual visit for follow up about medications. Lower extremity edema -- worse, taking furosemide 20 mg twice daily Bullous pemphigoid -- wants to know if she can restart her doxy Diabetes -- am fasting glucose is sometimes elevated around 199, wants to know can she take a morning dose of novolog too instead of just twice daily with meals.      Current Medication: Outpatient Encounter Medications as of 01/09/2022  Medication Sig Note   Aflibercept (EYLEA) 2 MG/0.05ML SOLN  12/22/2021: Every 3 months   aspirin EC 81 MG tablet Take 81 mg by mouth daily.     chlorpheniramine-HYDROcodone (TUSSIONEX) 10-8 MG/5ML Take 5 mLs by mouth every 12 (twelve) hours as needed for cough.    cholecalciferol (VITAMIN D) 1000 UNITS tablet Take 2,000 Units by mouth daily.     clobetasol ointment (TEMOVATE) 0.05 % Apply to affected areas twice daily. Avoid applying to face, groin, and axilla. Use as directed. Risk of skin atrophy with long-term use reviewed.    Dulaglutide (TRULICITY) 1.5 XB/2.8UX SOPN Inject 1.5 mg into the skin once a week.    Ferrous Gluconate 324 (37.5 Fe) MG TABS Take 324 mg by mouth daily.    fluocinolone  (SYNALAR) 0.01 % external solution Apply to affected areas ears and scalp 1-2 times a day as needed for itch.    hydrOXYzine (ATARAX/VISTARIL) 10 MG tablet TAKE 1-3 TABLETS BY MOUTH EVERY NIGHT AS NEEDED FOR ITCH.    insulin detemir (LEVEMIR FLEXPEN) 100 UNIT/ML FlexPen INJECT UP TO 75 UNITS UNDER THE SKIN DAILY, TITRATE AS NEEDED. 12/22/2021: 55 units   ketoconazole (NIZORAL) 2 % shampoo Massage into scalp, let sit several minutes before rinsing. Use 2-3x/wk.    Lactobacillus (PROBIOTIC ACIDOPHILUS) CAPS Take 1 capsule by mouth daily.    lisinopril (ZESTRIL) 10 MG tablet Take 10 mg by mouth daily.    Multiple Vitamin (MULTIVITAMIN) tablet Take 1 tablet by mouth daily at 12 noon.    Omega-3 Fatty Acids (FISH OIL OMEGA-3 PO) Take 25 mg by mouth daily.    pantoprazole (PROTONIX) 40 MG tablet TAKE 1 TABLET BY MOUTH EVERY DAY    pregabalin (LYRICA) 100 MG capsule TAKE 1 CAPSULE BY MOUTH TWICE A DAY    rosuvastatin (CRESTOR) 20 MG tablet Take 1 tablet (20 mg total) by mouth daily.    SHINGRIX injection     tacrolimus (PROTOPIC) 0.1 % ointment Apply topically 2 (two) times daily. Apply to body folds, face, groin as needed.    tamoxifen (NOLVADEX) 20 MG tablet Take 1 tablet (20 mg total) by mouth daily.    traMADol-acetaminophen (ULTRACET) 37.5-325 MG tablet Take 1 tablet by mouth 2 (two) times daily as needed for moderate pain.  triamcinolone (KENALOG) 0.025 % cream Apply 1 application. topically 2 (two) times daily.    zinc sulfate 220 (50 Zn) MG capsule Take 1 capsule (220 mg total) by mouth daily.    [DISCONTINUED] NOVOLOG FLEXPEN 100 UNIT/ML FlexPen INJECT [place] TWICE DAILY AS DIRECTED PER SLIDING SCALE. MAX DAILY DOSE IS 30 UNITS. DX E11.65.    [DISCONTINUED] verapamil (CALAN) 40 MG tablet Take 1 tablet (40 mg total) by mouth 2 (two) times daily.    furosemide (LASIX) 20 MG tablet Take 2 tablets by mouth in the AM and take 1 tablet by mouth in the PM. May add 1 tablet to afternoon dose if swelling not  improved.    insulin aspart (NOVOLOG FLEXPEN) 100 UNIT/ML FlexPen INJECT [place] 3 times daily with meals AS DIRECTED PER SLIDING SCALE. MAX DAILY DOSE IS 30 UNITS. DX E11.65.    [DISCONTINUED] furosemide (LASIX) 20 MG tablet Take 20 mg by mouth 2 (two) times daily.    No facility-administered encounter medications on file as of 01/09/2022.    Surgical History: Past Surgical History:  Procedure Laterality Date   BILATERAL SALPINGECTOMY     BREAST BIOPSY Right 2014   neg- core   BREAST BIOPSY Right 12/21/2018   [place] bx, venus clip,  DUCTAL CARCINOMA IN SITU   BREAST BIOPSY Right 12/21/2018   [place] bx, ribbon clip,  FIBROEPITHELIAL PROLIFERATION WITH SCLEROSIS   BREAST BIOPSY Right 01/01/2019   Affirm bx "X" clip-path pending   BREAST DUCTAL SYSTEM EXCISION     BREAST EXCISIONAL BIOPSY Right 2014   neg   BREAST LUMPECTOMY Right 01/22/2019   path pending   CATARACT EXTRACTION     CHOLECYSTECTOMY     CYSTOSCOPY WITH STENT PLACEMENT Left 04/14/2015   Procedure: CYSTOSCOPY WITH STENT PLACEMENT;  Surgeon: Hollice Espy, MD;  Location: [facility];  Service: Urology;  Laterality: Left;   EXTRACORPOREAL SHOCK WAVE LITHOTRIPSY Left 09/29/2014   Procedure: EXTRACORPOREAL SHOCK WAVE LITHOTRIPSY (ESWL);  Surgeon: Hollice Espy, MD;  Location: [facility];  Service: Urology;  Laterality: Left;   EYE SURGERY     bilateral cataract   IRRIGATION AND DEBRIDEMENT ABSCESS Right 03/24/2019   Procedure: IRRIGATION AND DEBRIDEMENT ABSCESS RIGHT BREAST;  Surgeon: Herbert Pun, MD;  Location: [facility];  Service: General;  Laterality: Right;   LAPAROSCOPIC OOPHERECTOMY Left    PARTIAL MASTECTOMY WITH NEEDLE LOCALIZATION Right 01/22/2019   Procedure: PARTIAL MASTECTOMY WITH NEEDLE LOCALIZATION;  Surgeon: Herbert Pun, MD;  Location: [facility];  Service: General;  Laterality: Right;   TONSILLECTOMY     TUBAL LIGATION     URETEROSCOPY WITH HOLMIUM LASER LITHOTRIPSY Left 04/14/2015   Procedure: URETEROSCOPY  WITH HOLMIUM LASER LITHOTRIPSY;  Surgeon: Hollice Espy, MD;  Location: [facility];  Service: Urology;  Laterality: Left;    Medical History: Past Medical History:  Diagnosis Date   Anemia    Arthropathy    Benign neoplasm of breast    Breast cancer ([place])    Chest pain    Chronic kidney disease    30% kidney function   Diabetes ([place])    Diabetic retinopathy ([place])    Dyspnea    Edema    History of kidney stones    Hyperlipemia    Hypersomnia with sleep apnea    Hypertension    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HCC)    Lumbago    Osteoarthrosis    Ovarian failure    Personal history of radiation therapy    SOB (shortness of breath)  Family History: Family History  Problem Relation Age of Onset   Breast cancer Maternal Grandmother 25   Colon cancer Mother    Lung cancer Father     Social History   Socioeconomic History   Marital status: Married    Spouse name: Not on file   Number of children: Not on file   Years of education: Not on file   Highest education level: Not on file  Occupational History   Not on file  Tobacco Use   Smoking status: Former    Packs/day: 1.00    Years: 30.00    Total pack years: 30.00    Types: Cigarettes    Quit date: 04/11/1995    Years since quitting: 26.8   Smokeless tobacco: Never  Vaping Use   Vaping Use: Never used  Substance and Sexual Activity   Alcohol use: No    Alcohol/week: 0.0 standard drinks of alcohol   Drug use: No   Sexual activity: Not on file  Other Topics Concern   Not on file  Social History Narrative   Not on file   Social Determinants of Health   Financial Resource Strain: Low Risk  (10/03/2020)   Overall Financial Resource Strain (CARDIA)    Difficulty of Paying Living Expenses: Not very hard  Food Insecurity: Not on file  Transportation Needs: Not on file  Physical Activity: Not on file  Stress: Not on file  Social Connections: Not on file  Intimate Partner Violence: Not on file       Review of Systems  Constitutional:  Positive for activity change, appetite change and fatigue.  Respiratory:  Positive for cough and shortness of breath.   Cardiovascular: Negative.  Negative for chest pain and palpitations.  Gastrointestinal:  Positive for nausea.  Musculoskeletal:  Positive for gait problem (uses walker).  Neurological:  Positive for weakness.    Vital Signs: BP (!) 115/53   Resp 16   Ht '5\' 3"'$  (1.6 m)   Wt 183 lb (83 kg)   BMI 32.42 kg/m    Observation/Objective: She is alert and oriented and engages in conversation appropriately. She does not sound as though she is in any acute distress over telephone call. Not able to do video call.    Assessment/Plan: 1. Controlled type 2 diabetes mellitus with hyperglycemia, without long-term current use of insulin (HCC) Novolog insulin refills ordered, need inperson visit soon to check a1c  2. Essential hypertension Lasix refills ordered, need in person visit soon to reassess BP   3. Bullous pemphigoid May restart prescribed medications for this problem   General Counseling: Kara [name] verbalizes understanding of the findings of today's phone visit and agrees with plan of treatment. I have discussed any further diagnostic evaluation that may be needed or ordered today. We also reviewed her medications today. she has been encouraged to call the office with any questions or concerns that should arise related to todays visit.  Return in about 3 weeks (around 01/30/2022) for F/U, Mitsuo Budnick PCP, in person, Recheck A1C.   No orders of the defined types were placed in this encounter.   Meds ordered this encounter  Medications   furosemide (LASIX) 20 MG tablet    Sig: Take 2 tablets by mouth in the AM and take 1 tablet by mouth in the PM. May add 1 tablet to afternoon dose if swelling not improved.    Dispense:  120 tablet    Refill:  2   insulin aspart (NOVOLOG FLEXPEN) 100 UNIT/ML  FlexPen    Sig: INJECT [place] 3 times  daily with meals AS DIRECTED PER SLIDING SCALE. MAX DAILY DOSE IS 30 UNITS. DX E11.65.    Dispense:  15 mL    Refill:  3    Time spent:20 Minutes Time spent with patient included reviewing progress notes, labs, imaging studies, and discussing plan for follow up.  [place] Controlled Substance Database was reviewed by me for overdose risk score (ORS) if appropriate.  This patient was seen by Jonetta Osgood, FNP-C in collaboration with Dr. Clayborn Bigness as a part of collaborative care agreement.  Stacyann Mcconaughy R. Valetta Fuller, MSN, FNP-C Internal medicine

## 2022-01-10 ENCOUNTER — Other Ambulatory Visit: Payer: Self-pay | Admitting: Internal Medicine

## 2022-01-10 ENCOUNTER — Ambulatory Visit: Payer: Medicare HMO | Admitting: Nurse Practitioner

## 2022-01-10 NOTE — Telephone Encounter (Signed)
Pt needs 3 month diabetic follow up

## 2022-01-14 ENCOUNTER — Telehealth: Payer: Self-pay | Admitting: Nurse Practitioner

## 2022-01-14 NOTE — Telephone Encounter (Signed)
Received order from Marcus. Gave to Alyssa for signature-Toni

## 2022-01-21 ENCOUNTER — Encounter: Payer: Self-pay | Admitting: Nurse Practitioner

## 2022-01-21 ENCOUNTER — Telehealth (INDEPENDENT_AMBULATORY_CARE_PROVIDER_SITE_OTHER): Payer: Medicare HMO | Admitting: Nurse Practitioner

## 2022-01-21 VITALS — BP 129/51 | Ht 64.0 in | Wt 173.0 lb

## 2022-01-21 DIAGNOSIS — I10 Essential (primary) hypertension: Secondary | ICD-10-CM

## 2022-01-21 DIAGNOSIS — R110 Nausea: Secondary | ICD-10-CM | POA: Diagnosis not present

## 2022-01-21 DIAGNOSIS — E1165 Type 2 diabetes mellitus with hyperglycemia: Secondary | ICD-10-CM | POA: Diagnosis not present

## 2022-01-21 MED ORDER — FUROSEMIDE 20 MG PO TABS
ORAL_TABLET | ORAL | 2 refills | Status: DC
Start: 2022-01-21 — End: 2022-11-26

## 2022-01-21 MED ORDER — NOVOLOG FLEXPEN 100 UNIT/ML ~~LOC~~ SOPN: PEN_INJECTOR | SUBCUTANEOUS | 3 refills | Status: DC

## 2022-01-21 MED ORDER — ONDANSETRON 4 MG PO TBDP: 4.0000 mg | ORAL_TABLET | Freq: Three times a day (TID) | ORAL | 3 refills | Status: DC | PRN

## 2022-01-21 NOTE — Progress Notes (Signed)
[facility] [place], [place] [address]  Internal MEDICINE  Telephone Visit  Patient Name: Kara [name]  [id number]  [id number]  Date of Service: 01/21/2022  I connected with the patient at 0950 by telephone and verified the patients identity using two identifiers.   I discussed the limitations, risks, security and privacy concerns of performing an evaluation and management service by telephone and the availability of in person appointments. I also discussed with the patient that there may be a patient responsible charge related to the service.  The patient expressed understanding and agrees to proceed.    Chief Complaint  Patient presents with   Telephone Assessment    [phone] telephone    Telephone Screen   Hyperlipidemia   Hypertension   Diabetes   Nausea    HPI Kara [name] presents for a telehealth virtual visit for hypertension, diabetes, nausea and medication review. Still not able to leave the house to come to the clinic, due for A1C check but will postpone for now.  --having nausea all the time, stomach feels off after being in the hospital and not eating for a week. Also having diarrhea sometimes Current BG 150, asking to do nurse visit for a1c next week --mammogram scheduled for next week --walking without walker most of the time now --still wearing oxygen some but wants to come off completely.   Current Medication: Outpatient Encounter Medications as of 01/21/2022  Medication Sig Note   Aflibercept (EYLEA) 2 MG/0.05ML SOLN  12/22/2021: Every 3 months   aspirin EC 81 MG tablet Take 81 mg by mouth daily.     chlorpheniramine-HYDROcodone (TUSSIONEX) 10-8 MG/5ML Take 5 mLs by mouth every 12 (twelve) hours as needed for cough.    cholecalciferol (VITAMIN D) 1000 UNITS tablet Take 2,000 Units by mouth daily.     clobetasol ointment (TEMOVATE) 0.05 % Apply to affected areas twice daily. Avoid applying to face, groin, and axilla. Use as directed. Risk of  skin atrophy with long-term use reviewed.    Dulaglutide (TRULICITY) 1.5 BH/4.1PF SOPN Inject 1.5 mg into the skin once a week.    Ferrous Gluconate 324 (37.5 Fe) MG TABS Take 324 mg by mouth daily.    fluocinolone (SYNALAR) 0.01 % external solution Apply to affected areas ears and scalp 1-2 times a day as needed for itch.    hydrOXYzine (ATARAX/VISTARIL) 10 MG tablet TAKE 1-3 TABLETS BY MOUTH EVERY NIGHT AS NEEDED FOR ITCH.    insulin aspart (NOVOLOG FLEXPEN) 100 UNIT/ML FlexPen INJECT UNDER THE SKIN TWICE DAILY AS DIRECTED PER SLIDING SCALE. MAX DAILY DOSE IS 30 UNITS.    insulin detemir (LEVEMIR FLEXPEN) 100 UNIT/ML FlexPen INJECT UP TO 75 UNITS UNDER THE SKIN DAILY, TITRATE AS NEEDED. 12/22/2021: 55 units   ketoconazole (NIZORAL) 2 % shampoo Massage into scalp, let sit several minutes before rinsing. Use 2-3x/wk.    Lactobacillus (PROBIOTIC ACIDOPHILUS) CAPS Take 1 capsule by mouth daily.    lisinopril (ZESTRIL) 10 MG tablet Take 10 mg by mouth daily.    Multiple Vitamin (MULTIVITAMIN) tablet Take 1 tablet by mouth daily at 12 noon.    Omega-3 Fatty Acids (FISH OIL OMEGA-3 PO) Take 25 mg by mouth daily.    ondansetron (ZOFRAN-ODT) 4 MG disintegrating tablet Take 1 tablet (4 mg total) by mouth every 8 (eight) hours as needed for nausea or vomiting. Let tab dissolve in mouth, do not swallow    pantoprazole (PROTONIX) 40 MG tablet TAKE 1 TABLET BY MOUTH EVERY DAY  pregabalin (LYRICA) 100 MG capsule TAKE 1 CAPSULE BY MOUTH TWICE A DAY    rosuvastatin (CRESTOR) 20 MG tablet Take 1 tablet (20 mg total) by mouth daily.    SHINGRIX injection     tacrolimus (PROTOPIC) 0.1 % ointment Apply topically 2 (two) times daily. Apply to body folds, face, groin as needed.    tamoxifen (NOLVADEX) 20 MG tablet Take 1 tablet (20 mg total) by mouth daily.    traMADol-acetaminophen (ULTRACET) 37.5-325 MG tablet Take 1 tablet by mouth 2 (two) times daily as needed for moderate pain.    triamcinolone (KENALOG) 0.025 %  cream Apply 1 application. topically 2 (two) times daily.    zinc sulfate 220 (50 Zn) MG capsule Take 1 capsule (220 mg total) by mouth daily.    [DISCONTINUED] furosemide (LASIX) 20 MG tablet Take 20 mg by mouth 2 (two) times daily.    No facility-administered encounter medications on file as of 01/21/2022.    Surgical History: Past Surgical History:  Procedure Laterality Date   BILATERAL SALPINGECTOMY     BREAST BIOPSY Right 2014   neg- core   BREAST BIOPSY Right 12/21/2018   [place] bx, venus clip,  DUCTAL CARCINOMA IN SITU   BREAST BIOPSY Right 12/21/2018   [place] bx, ribbon clip,  FIBROEPITHELIAL PROLIFERATION WITH SCLEROSIS   BREAST BIOPSY Right 01/01/2019   Affirm bx "X" clip-path pending   BREAST DUCTAL SYSTEM EXCISION     BREAST EXCISIONAL BIOPSY Right 2014   neg   BREAST LUMPECTOMY Right 01/22/2019   path pending   CATARACT EXTRACTION     CHOLECYSTECTOMY     CYSTOSCOPY WITH STENT PLACEMENT Left 04/14/2015   Procedure: CYSTOSCOPY WITH STENT PLACEMENT;  Surgeon: Hollice Espy, MD;  Location: [facility];  Service: Urology;  Laterality: Left;   EXTRACORPOREAL SHOCK WAVE LITHOTRIPSY Left 09/29/2014   Procedure: EXTRACORPOREAL SHOCK WAVE LITHOTRIPSY (ESWL);  Surgeon: Hollice Espy, MD;  Location: [facility];  Service: Urology;  Laterality: Left;   EYE SURGERY     bilateral cataract   IRRIGATION AND DEBRIDEMENT ABSCESS Right 03/24/2019   Procedure: IRRIGATION AND DEBRIDEMENT ABSCESS RIGHT BREAST;  Surgeon: Herbert Pun, MD;  Location: [facility];  Service: General;  Laterality: Right;   LAPAROSCOPIC OOPHERECTOMY Left    PARTIAL MASTECTOMY WITH NEEDLE LOCALIZATION Right 01/22/2019   Procedure: PARTIAL MASTECTOMY WITH NEEDLE LOCALIZATION;  Surgeon: Herbert Pun, MD;  Location: [facility];  Service: General;  Laterality: Right;   TONSILLECTOMY     TUBAL LIGATION     URETEROSCOPY WITH HOLMIUM LASER LITHOTRIPSY Left 04/14/2015   Procedure: URETEROSCOPY WITH HOLMIUM LASER  LITHOTRIPSY;  Surgeon: Hollice Espy, MD;  Location: [facility];  Service: Urology;  Laterality: Left;    Medical History: Past Medical History:  Diagnosis Date   Anemia    Arthropathy    Benign neoplasm of breast    Breast cancer ([place])    Chest pain    Chronic kidney disease    30% kidney function   Diabetes ([place])    Diabetic retinopathy ([place])    Dyspnea    Edema    History of kidney stones    Hyperlipemia    Hypersomnia with sleep apnea    Hypertension    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HCC)    Lumbago    Osteoarthrosis    Ovarian failure    Personal history of radiation therapy    SOB (shortness of breath)     Family History: Family History  Problem Relation Age of Onset  Breast cancer Maternal Grandmother 51   Colon cancer Mother    Lung cancer Father     Social History   Socioeconomic History   Marital status: Married    Spouse name: Not on file   Number of children: Not on file   Years of education: Not on file   Highest education level: Not on file  Occupational History   Not on file  Tobacco Use   Smoking status: Former    Packs/day: 1.00    Years: 30.00    Total pack years: 30.00    Types: Cigarettes    Quit date: 04/11/1995    Years since quitting: 26.8   Smokeless tobacco: Never  Vaping Use   Vaping Use: Never used  Substance and Sexual Activity   Alcohol use: No    Alcohol/week: 0.0 standard drinks of alcohol   Drug use: No   Sexual activity: Not on file  Other Topics Concern   Not on file  Social History Narrative   Not on file   Social Determinants of Health   Financial Resource Strain: Low Risk  (10/03/2020)   Overall Financial Resource Strain (CARDIA)    Difficulty of Paying Living Expenses: Not very hard  Food Insecurity: Not on file  Transportation Needs: Not on file  Physical Activity: Not on file  Stress: Not on file  Social Connections: Not on file  Intimate Partner Violence: Not on file      Review of Systems   Constitutional:  Positive for activity change, appetite change and fatigue.  Respiratory:  Positive for cough and shortness of breath.   Cardiovascular: Negative.  Negative for chest pain and palpitations.  Gastrointestinal: Negative.   Musculoskeletal:  Positive for gait problem (uses walker).  Neurological:  Positive for weakness.    Vital Signs: BP (!) 129/51   Ht '5\' 4"'$  (1.626 m)   Wt 173 lb (78.5 kg)   BMI 29.70 kg/m    Observation/Objective: She is alert and oriented and engages in conversation appropriately. She does not sound as though she is in any acute distress over telephone call. Not able to do video call.    Assessment/Plan: 1. Nausea without vomiting Zofran ODT prescribed prn - ondansetron (ZOFRAN-ODT) 4 MG disintegrating tablet; Take 1 tablet (4 mg total) by mouth every 8 (eight) hours as needed for nausea or vomiting. Let tab dissolve in mouth, do not swallow  Dispense: 20 tablet; Refill: 3  2. Controlled type 2 diabetes mellitus with hyperglycemia, without long-term current use of insulin ([place]) Due for a1c check, pt wants to do nurse visit for a1c on 10/12 when she is out getting her mammogram as well.  3. Essential hypertension Need to have appt in person soon to evaluate BP again    General Counseling: [name] understanding of the findings of today's phone visit and agrees with plan of treatment. I have discussed any further diagnostic evaluation that may be needed or ordered today. We also reviewed her medications today. she has been encouraged to call the office with any questions or concerns that should arise related to todays visit.  Return for nurse visit for a1c on 10/12 but will need in person visit soon for 6 minute walk.   No orders of the defined types were placed in this encounter.   Meds ordered this encounter  Medications   ondansetron (ZOFRAN-ODT) 4 MG disintegrating tablet    Sig: Take 1 tablet (4 mg total) by mouth every 8  (eight) hours  as needed for nausea or vomiting. Let tab dissolve in mouth, do not swallow    Dispense:  20 tablet    Refill:  3    Please fill today and deliver if possible    Time spent:20 Minutes Time spent with patient included reviewing progress notes, labs, imaging studies, and discussing plan for follow up.  [place] Controlled Substance Database was reviewed by me for overdose risk score (ORS) if appropriate.  This patient was seen by Jonetta Osgood, FNP-C in collaboration with Dr. Clayborn Bigness as a part of collaborative care agreement.  Demarques Pilz R. Valetta Fuller, MSN, FNP-C Internal medicine

## 2022-01-31 ENCOUNTER — Telehealth: Payer: Self-pay | Admitting: Nurse Practitioner

## 2022-01-31 ENCOUNTER — Ambulatory Visit
Admission: RE | Admit: 2022-01-31 | Discharge: 2022-01-31 | Disposition: A | Discharge status: Home / Self Care | Payer: Medicare HMO | Source: Ambulatory Visit | Attending: Oncology | Admitting: Oncology

## 2022-01-31 ENCOUNTER — Encounter: Payer: Self-pay | Admitting: Nurse Practitioner

## 2022-01-31 ENCOUNTER — Ambulatory Visit (INDEPENDENT_AMBULATORY_CARE_PROVIDER_SITE_OTHER): Payer: Medicare HMO | Admitting: Physician Assistant

## 2022-01-31 VITALS — BP 122/50 | HR 88 | Temp 96.5°F | Resp 16 | Ht 64.0 in | Wt 174.8 lb

## 2022-01-31 DIAGNOSIS — I10 Essential (primary) hypertension: Secondary | ICD-10-CM | POA: Diagnosis not present

## 2022-01-31 DIAGNOSIS — E1122 Type 2 diabetes mellitus with diabetic chronic kidney disease: Secondary | ICD-10-CM | POA: Diagnosis not present

## 2022-01-31 DIAGNOSIS — E1165 Type 2 diabetes mellitus with hyperglycemia: Secondary | ICD-10-CM

## 2022-01-31 DIAGNOSIS — D0511 Intraductal carcinoma in situ of right breast: Secondary | ICD-10-CM | POA: Diagnosis not present

## 2022-01-31 DIAGNOSIS — R0602 Shortness of breath: Secondary | ICD-10-CM

## 2022-01-31 DIAGNOSIS — Z794 Long term (current) use of insulin: Secondary | ICD-10-CM | POA: Diagnosis not present

## 2022-01-31 DIAGNOSIS — Z1231 Encounter for screening mammogram for malignant neoplasm of breast: Secondary | ICD-10-CM | POA: Insufficient documentation

## 2022-01-31 LAB — POCT GLYCOSYLATED HEMOGLOBIN (HGB A1C): Hemoglobin A1C: 6.7 % — AB (ref 4.0–5.6)

## 2022-01-31 NOTE — Progress Notes (Signed)
[facility] [place], [place] [address]  Internal MEDICINE  Office Visit Note  Patient Name: Kara [name]  [id number]  [id number]  Date of Service: 02/06/2022  Chief Complaint  Patient presents with   Follow-up   Hypertension   Hyperlipidemia   Diabetes    HPI Pt is here for routine follow up -She was hospitalized with covid-PNA and sepsis in Sept and has been recovering since. Has been off oxygen for the last 9 days and oxygen levels have been stable 96-98% at home. Will need to set up a 28mn walk test as patient would like to get rid of oxygen concentrator at home. She is not on oxygen in the office. -BP at home usually 128/50s, was taken off of the lisinopril and verapamil and has been stable. Still takes the lasix and wears compression for fluid. -Still doing PT -She is taking Levemir 14 units, novalog 10 units or less total a day. Taking trulicity weekly --BG in Am avg 130s -She is feeling much better today and is not SOB or having any breathing difficulty. Will go ahead and do 6 minute walk test to determine if continued oxygen need  Current Medication: Outpatient Encounter Medications as of 01/31/2022  Medication Sig Note   Aflibercept (EYLEA) 2 MG/0.05ML SOLN  12/22/2021: Every 3 months   aspirin EC 81 MG tablet Take 81 mg by mouth daily.     chlorpheniramine-HYDROcodone (TUSSIONEX) 10-8 MG/5ML Take 5 mLs by mouth every 12 (twelve) hours as needed for cough.    cholecalciferol (VITAMIN D) 1000 UNITS tablet Take 2,000 Units by mouth daily.     clobetasol ointment (TEMOVATE) 0.05 % Apply to affected areas twice daily. Avoid applying to face, groin, and axilla. Use as directed. Risk of skin atrophy with long-term use reviewed.    Dulaglutide (TRULICITY) 1.5 MJT/7.0VXSOPN Inject 1.5 mg into the skin once a week.    Ferrous Gluconate 324 (37.5 Fe) MG TABS Take 324 mg by mouth daily.    fluocinolone (SYNALAR) 0.01 % external solution Apply to affected  areas ears and scalp 1-2 times a day as needed for itch.    furosemide (LASIX) 20 MG tablet Take 2 tablets by mouth in the AM and take 1 tablet by mouth in the PM. May add 1 tablet to afternoon dose if swelling not improved.    hydrOXYzine (ATARAX/VISTARIL) 10 MG tablet TAKE 1-3 TABLETS BY MOUTH EVERY NIGHT AS NEEDED FOR ITCH.    insulin aspart (NOVOLOG FLEXPEN) 100 UNIT/ML FlexPen INJECT [place] 3 times daily with meals AS DIRECTED PER SLIDING SCALE. MAX DAILY DOSE IS 30 UNITS. DX E11.65.    insulin aspart (NOVOLOG FLEXPEN) 100 UNIT/ML FlexPen INJECT UNDER THE SKIN TWICE DAILY AS DIRECTED PER SLIDING SCALE. MAX DAILY DOSE IS 30 UNITS.    insulin detemir (LEVEMIR FLEXPEN) 100 UNIT/ML FlexPen INJECT UP TO 75 UNITS UNDER THE SKIN DAILY, TITRATE AS NEEDED. 12/22/2021: 55 units   ketoconazole (NIZORAL) 2 % shampoo Massage into scalp, let sit several minutes before rinsing. Use 2-3x/wk.    Lactobacillus (PROBIOTIC ACIDOPHILUS) CAPS Take 1 capsule by mouth daily.    Multiple Vitamin (MULTIVITAMIN) tablet Take 1 tablet by mouth daily at 12 noon.    Omega-3 Fatty Acids (FISH OIL OMEGA-3 PO) Take 25 mg by mouth daily.    ondansetron (ZOFRAN-ODT) 4 MG disintegrating tablet Take 1 tablet (4 mg total) by mouth every 8 (eight) hours as needed for nausea or vomiting. Let tab dissolve in  mouth, do not swallow    pantoprazole (PROTONIX) 40 MG tablet TAKE 1 TABLET BY MOUTH EVERY DAY    pregabalin (LYRICA) 100 MG capsule TAKE 1 CAPSULE BY MOUTH TWICE A DAY    rosuvastatin (CRESTOR) 20 MG tablet Take 1 tablet (20 mg total) by mouth daily.    SHINGRIX injection     tacrolimus (PROTOPIC) 0.1 % ointment Apply topically 2 (two) times daily. Apply to body folds, face, groin as needed.    tamoxifen (NOLVADEX) 20 MG tablet Take 1 tablet (20 mg total) by mouth daily.    traMADol-acetaminophen (ULTRACET) 37.5-325 MG tablet Take 1 tablet by mouth 2 (two) times daily as needed for moderate pain.    triamcinolone (KENALOG) 0.025 %  cream Apply 1 application. topically 2 (two) times daily.    zinc sulfate 220 (50 Zn) MG capsule Take 1 capsule (220 mg total) by mouth daily.    lisinopril (ZESTRIL) 10 MG tablet Take 10 mg by mouth daily.    No facility-administered encounter medications on file as of 01/31/2022.    Surgical History: Past Surgical History:  Procedure Laterality Date   BILATERAL SALPINGECTOMY     BREAST BIOPSY Right 2014   neg- core   BREAST BIOPSY Right 12/21/2018   [place] bx, venus clip,  DUCTAL CARCINOMA IN SITU   BREAST BIOPSY Right 12/21/2018   [place] bx, ribbon clip,  FIBROEPITHELIAL PROLIFERATION WITH SCLEROSIS   BREAST BIOPSY Right 01/01/2019   Affirm bx "X" clip-path pending   BREAST DUCTAL SYSTEM EXCISION     BREAST EXCISIONAL BIOPSY Right 2014   neg   BREAST LUMPECTOMY Right 01/22/2019   path pending   CATARACT EXTRACTION     CHOLECYSTECTOMY     CYSTOSCOPY WITH STENT PLACEMENT Left 04/14/2015   Procedure: CYSTOSCOPY WITH STENT PLACEMENT;  Surgeon: Hollice Espy, MD;  Location: [facility];  Service: Urology;  Laterality: Left;   EXTRACORPOREAL SHOCK WAVE LITHOTRIPSY Left 09/29/2014   Procedure: EXTRACORPOREAL SHOCK WAVE LITHOTRIPSY (ESWL);  Surgeon: Hollice Espy, MD;  Location: [facility];  Service: Urology;  Laterality: Left;   EYE SURGERY     bilateral cataract   IRRIGATION AND DEBRIDEMENT ABSCESS Right 03/24/2019   Procedure: IRRIGATION AND DEBRIDEMENT ABSCESS RIGHT BREAST;  Surgeon: Herbert Pun, MD;  Location: [facility];  Service: General;  Laterality: Right;   LAPAROSCOPIC OOPHERECTOMY Left    PARTIAL MASTECTOMY WITH NEEDLE LOCALIZATION Right 01/22/2019   Procedure: PARTIAL MASTECTOMY WITH NEEDLE LOCALIZATION;  Surgeon: Herbert Pun, MD;  Location: [facility];  Service: General;  Laterality: Right;   TONSILLECTOMY     TUBAL LIGATION     URETEROSCOPY WITH HOLMIUM LASER LITHOTRIPSY Left 04/14/2015   Procedure: URETEROSCOPY WITH HOLMIUM LASER LITHOTRIPSY;  Surgeon: Hollice Espy, MD;  Location: [facility];  Service: Urology;  Laterality: Left;    Medical History: Past Medical History:  Diagnosis Date   Anemia    Arthropathy    Benign neoplasm of breast    Breast cancer ([place])    Chest pain    Chronic kidney disease    30% kidney function   Diabetes ([place])    Diabetic retinopathy ([place])    Dyspnea    Edema    History of kidney stones    Hyperlipemia    Hypersomnia with sleep apnea    Hypertension    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HCC)    Lumbago    Osteoarthrosis    Ovarian failure    Personal history of radiation therapy  SOB (shortness of breath)     Family History: Family History  Problem Relation Age of Onset   Breast cancer Maternal Grandmother 48   Colon cancer Mother    Lung cancer Father     Social History   Socioeconomic History   Marital status: Married    Spouse name: Not on file   Number of children: Not on file   Years of education: Not on file   Highest education level: Not on file  Occupational History   Not on file  Tobacco Use   Smoking status: Former    Packs/day: 1.00    Years: 30.00    Total pack years: 30.00    Types: Cigarettes    Quit date: 04/11/1995    Years since quitting: 26.8   Smokeless tobacco: Never  Vaping Use   Vaping Use: Never used  Substance and Sexual Activity   Alcohol use: No    Alcohol/week: 0.0 standard drinks of alcohol   Drug use: No   Sexual activity: Not on file  Other Topics Concern   Not on file  Social History Narrative   Not on file   Social Determinants of Health   Financial Resource Strain: Low Risk  (10/03/2020)   Overall Financial Resource Strain (CARDIA)    Difficulty of Paying Living Expenses: Not very hard  Food Insecurity: Not on file  Transportation Needs: Not on file  Physical Activity: Not on file  Stress: Not on file  Social Connections: Not on file  Intimate Partner Violence: Not on file      Review of Systems  Constitutional:  Negative for  chills, fatigue and unexpected weight change.  HENT:  Negative for congestion, postnasal drip, rhinorrhea, sneezing and sore throat.   Eyes:  Negative for redness.  Respiratory:  Negative for cough, chest tightness and shortness of breath.   Cardiovascular:  Negative for chest pain and palpitations.  Gastrointestinal:  Negative for abdominal pain, constipation, diarrhea, nausea and vomiting.  Genitourinary:  Negative for dysuria and frequency.  Musculoskeletal:  Negative for arthralgias, back pain, joint swelling and neck pain.  Skin:  Negative for rash.  Neurological: Negative.  Negative for tremors and numbness.  Hematological:  Negative for adenopathy. Does not bruise/bleed easily.  Psychiatric/Behavioral:  Negative for behavioral problems (Depression), sleep disturbance and suicidal ideas. The patient is not nervous/anxious.     Vital Signs: BP (!) 122/50 Comment: 141/48  Pulse 88   Temp (!) 96.5 F (35.8 C)   Resp 16   Ht '5\' 4"'$  (1.626 m)   Wt 174 lb 12.8 oz (79.3 kg)   SpO2 96%   BMI 30.00 kg/m    Physical Exam Vitals and nursing note reviewed.  Constitutional:      General: She is not in acute distress.    Appearance: Normal appearance. She is obese. She is not ill-appearing.  HENT:     Head: Normocephalic and atraumatic.  Eyes:     Pupils: Pupils are equal, round, and reactive to light.  Cardiovascular:     Rate and Rhythm: Normal rate and regular rhythm.  Pulmonary:     Effort: Pulmonary effort is normal. No respiratory distress.  Skin:    General: Skin is warm and dry.  Neurological:     Mental Status: She is alert and oriented to person, place, and time.  Psychiatric:        Mood and Affect: Mood normal.        Behavior: Behavior normal.  Assessment/Plan: 1. Type 2 diabetes mellitus with chronic kidney disease, with long-term current use of insulin, unspecified CKD stage (HCC) - POCT glycosylated hemoglobin (Hb A1C) is 6.7 which is only slightly  elevated from 6.5 last check. Continue current medications and working on diet and exercise  2. Essential hypertension Stable, continue lasix as before  3. SOB (shortness of breath) - 6 minute walk; Future -Unfortunately patient did desat during walk indicating continued need for oxygen at this time. Patient advised to continue to monitor oxygen at home and may try retesting in future.   General Counseling: Kara [name] understanding of the findings of todays visit and agrees with plan of treatment. I have discussed any further diagnostic evaluation that may be needed or ordered today. We also reviewed her medications today. she has been encouraged to call the office with any questions or concerns that should arise related to todays visit.    Orders Placed This Encounter  Procedures   POCT glycosylated hemoglobin (Hb A1C)   6 minute walk    No orders of the defined types were placed in this encounter.   This patient was seen by Drema Dallas, PA-C in collaboration with Dr. Clayborn Bigness as a part of collaborative care agreement.   Total time spent:30 Minutes Time spent includes review of chart, medications, test results, and follow up plan with the patient.      Dr Lavera Guise Internal medicine

## 2022-01-31 NOTE — Telephone Encounter (Signed)
01/03/22 order faxed back to [facility]; [phone]. Scanned-Toni

## 2022-02-07 ENCOUNTER — Encounter: Payer: Self-pay | Admitting: Nurse Practitioner

## 2022-02-07 ENCOUNTER — Ambulatory Visit: Payer: Medicare HMO | Admitting: Nurse Practitioner

## 2022-02-07 ENCOUNTER — Inpatient Hospital Stay: Payer: Medicare HMO | Attending: Nurse Practitioner | Admitting: Nurse Practitioner

## 2022-02-07 DIAGNOSIS — D0511 Intraductal carcinoma in situ of right breast: Secondary | ICD-10-CM

## 2022-02-07 DIAGNOSIS — Z79810 Long term (current) use of selective estrogen receptor modulators (SERMs): Secondary | ICD-10-CM

## 2022-02-07 DIAGNOSIS — Z5181 Encounter for therapeutic drug level monitoring: Secondary | ICD-10-CM

## 2022-02-07 NOTE — Progress Notes (Signed)
Patient called/ pre- screened for telephone appoinment today. Concerns of SOB(recent covid hospitalization), dizziness and low appetite

## 2022-02-07 NOTE — Progress Notes (Signed)
[place]  Telephone:(336) [phone] Fax:(336) [phone]  ID: Kara [name] OB: 10/02/1945  MR#: [id number]  AOZ#:[id number]  Patient Care Team: Jonetta Osgood, NP as PCP - General (Nurse Practitioner) Lloyd Huger, MD as Consulting Physician (Oncology) Herbert Pun, MD as Consulting Physician (General Surgery) Noreene Filbert, MD as Referring Physician (Radiation Oncology) Alena Bills, [facility] as Pharmacist (Pharmacist)  I connected with Kara [name] on 02/07/22 at  3:00 PM EDT by telephone visit and verified that I am speaking with the correct person using two identifiers.   I discussed the limitations, risks, security and privacy concerns of performing an evaluation and management service by telemedicine and the availability of in-person appointments. I also discussed with the patient that there may be a patient responsible charge related to this service. The patient expressed understanding and agreed to proceed.   Other persons participating in the visit and their role in the encounter: none  Patient's location: Home. Provider's location: Clinic.   CHIEF COMPLAINT: [name]-grade DCIS of the right breast.  INTERVAL HISTORY: Patient agreed to telephone visit for routine 39-monthevaluation.    She continues to be under treatment for her bullous pemphigoid, but symptoms have improved. Has been hospitalized with covid infection recently and continues to recover. She is tolerating tamoxifen without significant side effects. No breast lumps or concerns. She has no neurologic complaints.  She denies any recent fevers or illnesses.  She has a good appetite and denies weight loss.  She has no chest pain, shortness of breath, cough, or hemoptysis.  She denies any nausea, vomiting, constipation, or diarrhea.  She has no urinary complaints.  Patient offers no further specific complaints today.  REVIEW OF SYSTEMS:   Review of Systems  Constitutional:  Negative.  Negative for fever, malaise/fatigue and weight loss.  Respiratory: Negative.  Negative for cough, hemoptysis and shortness of breath.   Cardiovascular: Negative.  Negative for chest pain and leg swelling.  Gastrointestinal: Negative.  Negative for abdominal pain.  Genitourinary: Negative.  Negative for dysuria.  Musculoskeletal: Negative.  Negative for back pain and joint pain.  Skin:  Positive for itching and rash.  Neurological: Negative.  Negative for dizziness, focal weakness, weakness and headaches.  Psychiatric/Behavioral: Negative.  The patient is not nervous/anxious.   As per HPI. Otherwise, a complete review of systems is negative.  PAST MEDICAL HISTORY: Past Medical History:  Diagnosis Date   Anemia    Arthropathy    Benign neoplasm of breast    [place]    Chest pain    Chronic kidney disease    30% kidney function   [place]    [place]    Dyspnea    Edema    History of kidney stones    Hyperlipemia    Hypersomnia with sleep apnea    Hypertension    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    Lumbago    Osteoarthrosis    Ovarian failure    Personal history of radiation therapy    Pneumonia 12/21/2021   covid with pneumonia( hospitalization)   SOB (shortness of breath)     PAST SURGICAL HISTORY: Past Surgical History:  Procedure Laterality Date   BILATERAL SALPINGECTOMY     BREAST BIOPSY Right 2014   neg- core   BREAST BIOPSY Right 12/21/2018   [place], venus clip,  DUCTAL CARCINOMA IN SITU   BREAST BIOPSY Right 12/21/2018   [place], ribbon clip,  FIBROEPITHELIAL PROLIFERATION WITH SCLEROSIS  BREAST BIOPSY Right 01/01/2019   Affirm bx "X" clip-path pending   BREAST DUCTAL SYSTEM EXCISION     BREAST EXCISIONAL BIOPSY Right 2014   neg   BREAST LUMPECTOMY Right 01/22/2019   path pending   CATARACT EXTRACTION     CHOLECYSTECTOMY     CYSTOSCOPY WITH STENT PLACEMENT Left 04/14/2015   Procedure: CYSTOSCOPY WITH  STENT PLACEMENT;  Surgeon: Hollice Espy, MD;  Location: [facility];  Service: Urology;  Laterality: Left;   EXTRACORPOREAL SHOCK WAVE LITHOTRIPSY Left 09/29/2014   Procedure: EXTRACORPOREAL SHOCK WAVE LITHOTRIPSY (ESWL);  Surgeon: Hollice Espy, MD;  Location: [facility];  Service: Urology;  Laterality: Left;   EYE SURGERY     bilateral cataract   IRRIGATION AND DEBRIDEMENT ABSCESS Right 03/24/2019   Procedure: IRRIGATION AND DEBRIDEMENT ABSCESS RIGHT BREAST;  Surgeon: Herbert Pun, MD;  Location: [facility];  Service: General;  Laterality: Right;   LAPAROSCOPIC OOPHERECTOMY Left    PARTIAL MASTECTOMY WITH NEEDLE LOCALIZATION Right 01/22/2019   Procedure: PARTIAL MASTECTOMY WITH NEEDLE LOCALIZATION;  Surgeon: Herbert Pun, MD;  Location: [facility];  Service: General;  Laterality: Right;   TONSILLECTOMY     TUBAL LIGATION     URETEROSCOPY WITH HOLMIUM LASER LITHOTRIPSY Left 04/14/2015   Procedure: URETEROSCOPY WITH HOLMIUM LASER LITHOTRIPSY;  Surgeon: Hollice Espy, MD;  Location: [facility];  Service: Urology;  Laterality: Left;    FAMILY HISTORY: Family History  Problem Relation Age of Onset   Breast cancer Maternal Grandmother 54   Colon cancer Mother    Lung cancer Father     ADVANCED DIRECTIVES (Y/N):  N  HEALTH MAINTENANCE: Social History   Tobacco Use   Smoking status: Former    Packs/day: 1.00    Years: 30.00    Total pack years: 30.00    Types: Cigarettes    Quit date: 04/11/1995    Years since quitting: 26.8   Smokeless tobacco: Never  Vaping Use   Vaping Use: Never used  Substance Use Topics   Alcohol use: No    Alcohol/week: 0.0 standard drinks of alcohol   Drug use: No     Colonoscopy:  PAP:  Bone density:  Lipid panel:  Allergies  Allergen Reactions   Amoxicillin Nausea And Vomiting    Current Outpatient Medications  Medication Sig Dispense Refill   Aflibercept (EYLEA) 2 MG/0.05ML SOLN      aspirin EC 81 MG tablet Take 81 mg by mouth  daily.      chlorpheniramine-HYDROcodone (TUSSIONEX) 10-8 MG/5ML Take 5 mLs by mouth every 12 (twelve) hours as needed for cough. 115 mL 0   clobetasol ointment (TEMOVATE) 0.05 % Apply to affected areas twice daily. Avoid applying to face, groin, and axilla. Use as directed. Risk of skin atrophy with long-term use reviewed. 60 g 2   Dulaglutide (TRULICITY) 1.5 NA/3.5TD SOPN Inject 1.5 mg into the skin once a week. 6 mL 4   Ferrous Gluconate 324 (37.5 Fe) MG TABS Take 324 mg by mouth daily.     fluocinolone (SYNALAR) 0.01 % external solution Apply to affected areas ears and scalp 1-2 times a day as needed for itch. 60 mL 1   furosemide (LASIX) 20 MG tablet Take 2 tablets by mouth in the AM and take 1 tablet by mouth in the PM. May add 1 tablet to afternoon dose if swelling not improved. 120 tablet 2   hydrOXYzine (ATARAX/VISTARIL) 10 MG tablet TAKE 1-3 TABLETS BY MOUTH EVERY NIGHT AS NEEDED FOR ITCH. 90 tablet 1  insulin aspart (NOVOLOG FLEXPEN) 100 UNIT/ML FlexPen INJECT [place] 3 times daily with meals AS DIRECTED PER SLIDING SCALE. MAX DAILY DOSE IS 30 UNITS. DX E11.65. 15 mL 3   insulin aspart (NOVOLOG FLEXPEN) 100 UNIT/ML FlexPen INJECT UNDER THE SKIN TWICE DAILY AS DIRECTED PER SLIDING SCALE. MAX DAILY DOSE IS 30 UNITS. 9 mL 2   insulin detemir (LEVEMIR FLEXPEN) 100 UNIT/ML FlexPen INJECT UP TO 75 UNITS UNDER THE SKIN DAILY, TITRATE AS NEEDED. 15 mL 2   ketoconazole (NIZORAL) 2 % shampoo Massage into scalp, let sit several minutes before rinsing. Use 2-3x/wk. 120 mL 3   Lactobacillus (PROBIOTIC ACIDOPHILUS) CAPS Take 1 capsule by mouth daily.     Omega-3 Fatty Acids (FISH OIL OMEGA-3 PO) Take 25 mg by mouth daily.     ondansetron (ZOFRAN-ODT) 4 MG disintegrating tablet Take 1 tablet (4 mg total) by mouth every 8 (eight) hours as needed for nausea or vomiting. Let tab dissolve in mouth, do not swallow 20 tablet 3   pantoprazole (PROTONIX) 40 MG tablet TAKE 1 TABLET BY MOUTH EVERY DAY 90 tablet 1    pregabalin (LYRICA) 100 MG capsule TAKE 1 CAPSULE BY MOUTH TWICE A DAY 60 capsule 2   rosuvastatin (CRESTOR) 20 MG tablet Take 1 tablet (20 mg total) by mouth daily. 90 tablet 3   SHINGRIX injection      tacrolimus (PROTOPIC) 0.1 % ointment Apply topically 2 (two) times daily. Apply to body folds, face, groin as needed. 100 g 2   tamoxifen (NOLVADEX) 20 MG tablet Take 1 tablet (20 mg total) by mouth daily. 90 tablet 3   traMADol-acetaminophen (ULTRACET) 37.5-325 MG tablet Take 1 tablet by mouth 2 (two) times daily as needed for moderate pain. 60 tablet 1   triamcinolone (KENALOG) 0.025 % cream Apply 1 application. topically 2 (two) times daily. 80 g 2   zinc sulfate 220 (50 Zn) MG capsule Take 1 capsule (220 mg total) by mouth daily. 30 capsule 0   cholecalciferol (VITAMIN D) 1000 UNITS tablet Take 2,000 Units by mouth daily.  (Patient not taking: Reported on 02/07/2022)     lisinopril (ZESTRIL) 10 MG tablet Take 10 mg by mouth daily. (Patient not taking: Reported on 02/07/2022)     Multiple Vitamin (MULTIVITAMIN) tablet Take 1 tablet by mouth daily at 12 noon. (Patient not taking: Reported on 02/07/2022)     No current facility-administered medications for this visit.    OBJECTIVE: There were no vitals filed for this visit.   There is no height or weight on file to calculate BMI.     ECOG FS:1 - Symptomatic but completely ambulatory  General: fatigued sounding.  HEENT: Normocephalic. Neuro: Alert, answering all questions appropriately.  Psych: Normal affect.   LAB RESULTS:  Lab Results  Component Value Date   NA 143 12/27/2021   K 3.4 (L) 12/27/2021   CL 106 12/27/2021   CO2 26 12/27/2021   GLUCOSE 161 (H) 12/27/2021   BUN 32 (H) 12/27/2021   CREATININE 1.53 (H) 12/27/2021   CALCIUM 8.8 (L) 12/27/2021   PROT 5.9 (L) 12/27/2021   ALBUMIN 2.6 (L) 12/27/2021   AST 34 12/27/2021   ALT 37 12/27/2021   ALKPHOS 59 12/27/2021   BILITOT 0.5 12/27/2021   GFRNONAA 35 (L) 12/27/2021    GFRAA 39 (L) 08/16/2019    Lab Results  Component Value Date   WBC 6.4 12/27/2021   NEUTROABS 4.8 12/27/2021   HGB 9.7 (L) 12/27/2021   HCT 30.6 (L)  12/27/2021   MCV 86.2 12/27/2021   PLT 198 12/27/2021     STUDIES: MM 3D SCREEN BREAST BILATERAL  Result Date: 01/31/2022 CLINICAL DATA:  Screening. EXAM: DIGITAL SCREENING BILATERAL MAMMOGRAM WITH TOMOSYNTHESIS AND CAD TECHNIQUE: Bilateral screening digital craniocaudal and mediolateral oblique mammograms were obtained. Bilateral screening digital breast tomosynthesis was performed. The images were evaluated with computer-aided detection. COMPARISON:  Previous exam(s). ACR Breast Density Category b: There are scattered areas of fibroglandular density. FINDINGS: There are no findings suspicious for malignancy. IMPRESSION: No mammographic evidence of malignancy. A result letter of this screening mammogram will be mailed directly to the patient. RECOMMENDATION: Screening mammogram in one year. (Code:SM-B-01Y) BI-RADS CATEGORY  1: Negative. Electronically Signed   By: Ileana Roup M.D.   On: 01/31/2022 17:29    ASSESSMENT: [name]-grade DCIS of the right breast.  PLAN:    1. [name]-grade DCIS of the right breast: Patient declined enrollment in COMET trial.  She underwent lumpectomy on January 22, 2019.  Final pathology noted close, but clear margins.  Patient initiated XRT, but then discontinued after approximately 3 weeks. She was extremely upset how she was treated by the radiation oncology provider and refused to return or continue XRT.  We previously discussed the possibility of transferring care to another facility which she also declined.  She acknowledged that although she has [name]-grade DCIS, this increases her risk of recurrence.  No further interventions are needed.  Continue tamoxifen for total of 5 years completing treatment in December 2025.  Her most recent mammogram on January 31, 2022 was reported as BI-RADS 1: negative.  Repeat in October  2024.  Continue follow-up every 6 months until patient completes tamoxifen.  Return to clinic in 6 months with video assisted telemedicine visit.   2.  Breast abscess: Resolved.   3.   Bullous pemphigoid: Improved.  Continue follow-up with dermatology as indicated.  I provided 15 minutes of non face-to-face telephone visit time during this encounter which included chart review, counseling, and coordination of care as documented above.  Patient expressed understanding and was in agreement with this plan. She also understands that She can call clinic at any time with any questions, concerns, or complaints.    Cancer Staging  Ductal carcinoma in situ (DCIS) of right breast Staging form: Breast, AJCC 8th Edition - Clinical stage from 01/01/2019: Stage 0 (cTis (DCIS), cN0, cM0, G1, ER+, PR: Not Assessed, HER2: Not Assessed) - Signed by Lloyd Huger, MD on 01/01/2019 Stage prefix: Initial diagnosis Histologic grading system: 3 grade system  Verlon Au, NP   02/07/2022

## 2022-02-11 DIAGNOSIS — H34832 Tributary (branch) retinal vein occlusion, left eye, with macular edema: Secondary | ICD-10-CM | POA: Diagnosis not present

## 2022-02-22 ENCOUNTER — Other Ambulatory Visit: Payer: Self-pay | Admitting: Nurse Practitioner

## 2022-02-22 ENCOUNTER — Other Ambulatory Visit: Payer: Self-pay | Admitting: Dermatology

## 2022-02-22 DIAGNOSIS — M159 Polyosteoarthritis, unspecified: Secondary | ICD-10-CM

## 2022-02-22 DIAGNOSIS — L120 Bullous pemphigoid: Secondary | ICD-10-CM

## 2022-02-22 NOTE — Telephone Encounter (Signed)
Med sent to pharmacy.

## 2022-02-22 NOTE — Telephone Encounter (Signed)
Last 01/21/22 and next 1/24

## 2022-03-04 DIAGNOSIS — M4316 Spondylolisthesis, lumbar region: Secondary | ICD-10-CM | POA: Diagnosis not present

## 2022-03-04 DIAGNOSIS — M545 Low back pain, unspecified: Secondary | ICD-10-CM | POA: Diagnosis not present

## 2022-03-04 DIAGNOSIS — M5416 Radiculopathy, lumbar region: Secondary | ICD-10-CM | POA: Diagnosis not present

## 2022-03-08 ENCOUNTER — Telehealth: Payer: Self-pay

## 2022-03-08 NOTE — Telephone Encounter (Signed)
[employer] renewal was faxed on 03-08-22.

## 2022-03-12 DIAGNOSIS — M5416 Radiculopathy, lumbar region: Secondary | ICD-10-CM | POA: Diagnosis not present

## 2022-03-19 ENCOUNTER — Ambulatory Visit (INDEPENDENT_AMBULATORY_CARE_PROVIDER_SITE_OTHER): Payer: Medicare HMO | Admitting: Nurse Practitioner

## 2022-03-19 ENCOUNTER — Encounter: Payer: Self-pay | Admitting: Nurse Practitioner

## 2022-03-19 VITALS — BP 157/82 | HR 82 | Temp 97.2°F | Resp 16 | Ht 64.0 in | Wt 171.0 lb

## 2022-03-19 DIAGNOSIS — M4800 Spinal stenosis, site unspecified: Secondary | ICD-10-CM | POA: Diagnosis not present

## 2022-03-19 DIAGNOSIS — M4316 Spondylolisthesis, lumbar region: Secondary | ICD-10-CM | POA: Diagnosis not present

## 2022-03-19 DIAGNOSIS — G834 Cauda equina syndrome: Secondary | ICD-10-CM

## 2022-03-19 MED ORDER — TRAMADOL-ACETAMINOPHEN 37.5-325 MG PO TABS: 1.0000 | ORAL_TABLET | Freq: Four times a day (QID) | ORAL | 2 refills | Status: DC | PRN

## 2022-03-19 NOTE — Progress Notes (Signed)
[facility] [place], [place] [address]  Internal MEDICINE  Office Visit Note  Patient Name: Kara [name]  [id number]  [id number]  Date of Service: 03/19/2022  Chief Complaint  Patient presents with   Follow-up    Back pain radiating down her legs.     HPI [name] presents for a follow up visit for back pain with sciatica radiating down legs bilaterally. Wants referral to specialist.  -- Had MRI done via [facility] but reports that no one reviewed the results with her.   -- MRI showed significant degenerative disc disease with compression of cauda equina significant central spinal stenosis mild levoscoliosis, apophyseal anterolisthesis, compression of vertebrae and numerous osteophyte formations.  -- Patient unsure if she needs referral to neurosurgery or pain management or both.  -- Provider at [facility] told patient that she could take tramadol every 6 hours as needed for pain, needs medication refills because the current order does not match the recommended dosing frequency.   Current Medication: Outpatient Encounter Medications as of 03/19/2022  Medication Sig Note   Aflibercept (EYLEA) 2 MG/0.05ML SOLN  12/22/2021: Every 3 months   aspirin EC 81 MG tablet Take 81 mg by mouth daily.     chlorpheniramine-HYDROcodone (TUSSIONEX) 10-8 MG/5ML Take 5 mLs by mouth every 12 (twelve) hours as needed for cough.    cholecalciferol (VITAMIN D) 1000 UNITS tablet Take 2,000 Units by mouth daily.    clobetasol ointment (TEMOVATE) 0.05 % APPLY TO AFFECTED AREAS TWICE DAILY. AVOID APPLYING TO FACE, GROIN, AND AXILLA. USE AS DIRECTED. RISK OF SKIN ATROPHY WITH LONG-TERM USE REVIEWED.    Dulaglutide (TRULICITY) 1.5 WU/1.3KG SOPN Inject 1.5 mg into the skin once a week.    Ferrous Gluconate 324 (37.5 Fe) MG TABS Take 324 mg by mouth daily.    fluocinolone (SYNALAR) 0.01 % external solution Apply to affected areas ears and scalp 1-2 times a day as needed for itch.     furosemide (LASIX) 20 MG tablet Take 2 tablets by mouth in the AM and take 1 tablet by mouth in the PM. May add 1 tablet to afternoon dose if swelling not improved.    hydrOXYzine (ATARAX/VISTARIL) 10 MG tablet TAKE 1-3 TABLETS BY MOUTH EVERY NIGHT AS NEEDED FOR ITCH.    insulin aspart (NOVOLOG FLEXPEN) 100 UNIT/ML FlexPen INJECT [place] 3 times daily with meals AS DIRECTED PER SLIDING SCALE. MAX DAILY DOSE IS 30 UNITS. DX E11.65.    insulin aspart (NOVOLOG FLEXPEN) 100 UNIT/ML FlexPen INJECT UNDER THE SKIN TWICE DAILY AS DIRECTED PER SLIDING SCALE. MAX DAILY DOSE IS 30 UNITS.    insulin detemir (LEVEMIR FLEXPEN) 100 UNIT/ML FlexPen INJECT UP TO 75 UNITS UNDER THE SKIN DAILY, TITRATE AS NEEDED. 12/22/2021: 55 units   ketoconazole (NIZORAL) 2 % shampoo Massage into scalp, let sit several minutes before rinsing. Use 2-3x/wk.    Lactobacillus (PROBIOTIC ACIDOPHILUS) CAPS Take 1 capsule by mouth daily.    lisinopril (ZESTRIL) 10 MG tablet Take 10 mg by mouth daily.    Multiple Vitamin (MULTIVITAMIN) tablet Take 1 tablet by mouth daily at 12 noon.    Omega-3 Fatty Acids (FISH OIL OMEGA-3 PO) Take 25 mg by mouth daily.    ondansetron (ZOFRAN-ODT) 4 MG disintegrating tablet Take 1 tablet (4 mg total) by mouth every 8 (eight) hours as needed for nausea or vomiting. Let tab dissolve in mouth, do not swallow    pantoprazole (PROTONIX) 40 MG tablet TAKE 1 TABLET BY MOUTH EVERY DAY  pregabalin (LYRICA) 100 MG capsule TAKE 1 CAPSULE BY MOUTH TWICE A DAY    rosuvastatin (CRESTOR) 20 MG tablet Take 1 tablet (20 mg total) by mouth daily.    SHINGRIX injection     tacrolimus (PROTOPIC) 0.1 % ointment Apply topically 2 (two) times daily. Apply to body folds, face, groin as needed.    tamoxifen (NOLVADEX) 20 MG tablet Take 1 tablet (20 mg total) by mouth daily.    triamcinolone (KENALOG) 0.025 % cream Apply 1 application. topically 2 (two) times daily.    zinc sulfate 220 (50 Zn) MG capsule Take 1 capsule (220 mg total)  by mouth daily.    [DISCONTINUED] traMADol-acetaminophen (ULTRACET) 37.5-325 MG tablet TAKE 1 TABLET BY MOUTH TWICE A DAY AS NEEDED FOR MODERATE PAIN 04/06    traMADol-acetaminophen (ULTRACET) 37.5-325 MG tablet Take 1 tablet by mouth every 6 (six) hours as needed for moderate pain or severe pain.    No facility-administered encounter medications on file as of 03/19/2022.    Surgical History: Past Surgical History:  Procedure Laterality Date   BILATERAL SALPINGECTOMY     BREAST BIOPSY Right 2014   neg- core   BREAST BIOPSY Right 12/21/2018   [place] bx, venus clip,  DUCTAL CARCINOMA IN SITU   BREAST BIOPSY Right 12/21/2018   [place] bx, ribbon clip,  FIBROEPITHELIAL PROLIFERATION WITH SCLEROSIS   BREAST BIOPSY Right 01/01/2019   Affirm bx "X" clip-path pending   BREAST DUCTAL SYSTEM EXCISION     BREAST EXCISIONAL BIOPSY Right 2014   neg   BREAST LUMPECTOMY Right 01/22/2019   path pending   CATARACT EXTRACTION     CHOLECYSTECTOMY     CYSTOSCOPY WITH STENT PLACEMENT Left 04/14/2015   Procedure: CYSTOSCOPY WITH STENT PLACEMENT;  Surgeon: Hollice Espy, MD;  Location: [facility];  Service: Urology;  Laterality: Left;   EXTRACORPOREAL SHOCK WAVE LITHOTRIPSY Left 09/29/2014   Procedure: EXTRACORPOREAL SHOCK WAVE LITHOTRIPSY (ESWL);  Surgeon: Hollice Espy, MD;  Location: [facility];  Service: Urology;  Laterality: Left;   EYE SURGERY     bilateral cataract   IRRIGATION AND DEBRIDEMENT ABSCESS Right 03/24/2019   Procedure: IRRIGATION AND DEBRIDEMENT ABSCESS RIGHT BREAST;  Surgeon: Herbert Pun, MD;  Location: [facility];  Service: General;  Laterality: Right;   LAPAROSCOPIC OOPHERECTOMY Left    PARTIAL MASTECTOMY WITH NEEDLE LOCALIZATION Right 01/22/2019   Procedure: PARTIAL MASTECTOMY WITH NEEDLE LOCALIZATION;  Surgeon: Herbert Pun, MD;  Location: [facility];  Service: General;  Laterality: Right;   TONSILLECTOMY     TUBAL LIGATION     URETEROSCOPY WITH HOLMIUM LASER LITHOTRIPSY Left  04/14/2015   Procedure: URETEROSCOPY WITH HOLMIUM LASER LITHOTRIPSY;  Surgeon: Hollice Espy, MD;  Location: [facility];  Service: Urology;  Laterality: Left;    Medical History: Past Medical History:  Diagnosis Date   Anemia    Arthropathy    Benign neoplasm of breast    Breast cancer ([place])    Chest pain    Chronic kidney disease    30% kidney function   Diabetes ([place])    Diabetic retinopathy ([place])    Dyspnea    Edema    History of kidney stones    Hyperlipemia    Hypersomnia with sleep apnea    Hypertension    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[place])    Lumbago    Osteoarthrosis    Ovarian failure    Personal history of radiation therapy    Pneumonia 12/21/2021   covid with pneumonia( hospitalization)  SOB (shortness of breath)     Family History: Family History  Problem Relation Age of Onset   Breast cancer Maternal Grandmother 63   Colon cancer Mother    Lung cancer Father     Social History   Socioeconomic History   Marital status: Married    Spouse name: Not on file   Number of children: Not on file   Years of education: Not on file   Highest education level: Not on file  Occupational History   Not on file  Tobacco Use   Smoking status: Former    Packs/day: 1.00    Years: 30.00    Total pack years: 30.00    Types: Cigarettes    Quit date: 04/11/1995    Years since quitting: 26.9   Smokeless tobacco: Never  Vaping Use   Vaping Use: Never used  Substance and Sexual Activity   Alcohol use: No    Alcohol/week: 0.0 standard drinks of alcohol   Drug use: No   Sexual activity: Not on file  Other Topics Concern   Not on file  Social History Narrative   Not on file   Social Determinants of Health   Financial Resource Strain: Low Risk  (10/03/2020)   Overall Financial Resource Strain (CARDIA)    Difficulty of Paying Living Expenses: Not very hard  Food Insecurity: Not on file  Transportation Needs: Not on file  Physical Activity: Not on file   Stress: Not on file  Social Connections: Not on file  Intimate Partner Violence: Not on file      Review of Systems  Constitutional:  Positive for fatigue. Negative for chills and unexpected weight change.  HENT:  Negative for congestion, rhinorrhea, sneezing and sore throat.   Eyes:  Negative for redness.  Respiratory: Negative.  Negative for cough, chest tightness, shortness of breath and wheezing.   Cardiovascular: Negative.  Negative for chest pain and palpitations.  Gastrointestinal:  Negative for abdominal pain, constipation, diarrhea, nausea and vomiting.  Genitourinary:  Negative for dysuria and frequency.  Musculoskeletal:  Positive for arthralgias, back pain and gait problem (In wheelchair, difficulty standing and walking, extreme pain.). Negative for joint swelling and neck pain.  Skin:  Negative for rash.  Neurological:  Negative for tremors and numbness.  Hematological:  Negative for adenopathy. Does not bruise/bleed easily.  Psychiatric/Behavioral:  Negative for behavioral problems (Depression), sleep disturbance and suicidal ideas. The patient is not nervous/anxious.     Vital Signs: BP (!) 157/82   Pulse 82   Temp (!) 97.2 F (36.2 C)   Resp 16   Ht '5\' 4"'$  (1.626 m)   Wt 171 lb (77.6 kg)   SpO2 98%   BMI 29.35 kg/m    Physical Exam Vitals reviewed.  Constitutional:      General: She is not in acute distress.    Appearance: Normal appearance. She is obese. She is ill-appearing.  HENT:     Head: Normocephalic and atraumatic.  Eyes:     Pupils: Pupils are equal, round, and reactive to light.  Cardiovascular:     Rate and Rhythm: Normal rate and regular rhythm.  Pulmonary:     Effort: Pulmonary effort is normal. No respiratory distress.  Neurological:     Mental Status: She is alert and oriented to person, place, and time.  Psychiatric:        Mood and Affect: Mood normal.        Behavior: Behavior normal.  Assessment/Plan: 1. Cauda  equina syndrome (HCC) Refer to neurosurgery and pain management ordered.  Ultracet prescription refilled - Ambulatory referral to Neurosurgery - Ambulatory referral to Pain Clinic - traMADol-acetaminophen (ULTRACET) 37.5-325 MG tablet; Take 1 tablet by mouth every 6 (six) hours as needed for moderate pain or severe pain.  Dispense: 120 tablet; Refill: 2  2. Central stenosis of spinal canal See problem #1 - Ambulatory referral to Neurosurgery - Ambulatory referral to Pain Clinic - traMADol-acetaminophen (ULTRACET) 37.5-325 MG tablet; Take 1 tablet by mouth every 6 (six) hours as needed for moderate pain or severe pain.  Dispense: 120 tablet; Refill: 2  3. Anterolisthesis of lumbar spine See problem #1 - Ambulatory referral to Neurosurgery - Ambulatory referral to Pain Clinic - traMADol-acetaminophen (ULTRACET) 37.5-325 MG tablet; Take 1 tablet by mouth every 6 (six) hours as needed for moderate pain or severe pain.  Dispense: 120 tablet; Refill: 2   General Counseling: Olene verbalizes understanding of the findings of todays visit and agrees with plan of treatment. I have discussed any further diagnostic evaluation that may be needed or ordered today. We also reviewed her medications today. she has been encouraged to call the office with any questions or concerns that should arise related to todays visit.    Orders Placed This Encounter  Procedures   Ambulatory referral to Neurosurgery   Ambulatory referral to Pain Clinic    Meds ordered this encounter  Medications   traMADol-acetaminophen (ULTRACET) 37.5-325 MG tablet    Sig: Take 1 tablet by mouth every 6 (six) hours as needed for moderate pain or severe pain.    Dispense:  120 tablet    Refill:  2    Refill with dose adjustment, note increased number of tablets.    Return for F/U, Shalina Norfolk PCP, previously scheduled in january.   Total time spent:30 Minutes Time spent includes review of chart, medications, test results, and  follow up plan with the patient.    Controlled Substance Database was reviewed by me.  This patient was seen by Jonetta Osgood, FNP-C in collaboration with Dr. Clayborn Bigness as a part of collaborative care agreement.   Wayburn Shaler R. Valetta Fuller, MSN, FNP-C Internal medicine

## 2022-03-20 ENCOUNTER — Telehealth: Payer: Medicare HMO

## 2022-03-21 ENCOUNTER — Other Ambulatory Visit: Payer: Self-pay

## 2022-03-21 ENCOUNTER — Encounter: Payer: Self-pay | Admitting: Orthopedic Surgery

## 2022-03-21 ENCOUNTER — Ambulatory Visit
Admission: RE | Admit: 2022-03-21 | Discharge: 2022-03-21 | Disposition: A | Discharge status: Home / Self Care | Payer: Self-pay | Source: Ambulatory Visit | Attending: Orthopedic Surgery | Admitting: Orthopedic Surgery

## 2022-03-21 ENCOUNTER — Ambulatory Visit (INDEPENDENT_AMBULATORY_CARE_PROVIDER_SITE_OTHER): Payer: Medicare HMO | Admitting: Orthopedic Surgery

## 2022-03-21 VITALS — BP 142/82 | Ht 64.0 in | Wt 170.0 lb

## 2022-03-21 DIAGNOSIS — M4316 Spondylolisthesis, lumbar region: Secondary | ICD-10-CM | POA: Diagnosis not present

## 2022-03-21 DIAGNOSIS — M48061 Spinal stenosis, lumbar region without neurogenic claudication: Secondary | ICD-10-CM | POA: Diagnosis not present

## 2022-03-21 DIAGNOSIS — M5416 Radiculopathy, lumbar region: Secondary | ICD-10-CM

## 2022-03-21 DIAGNOSIS — Z049 Encounter for examination and observation for unspecified reason: Secondary | ICD-10-CM

## 2022-03-21 NOTE — Patient Instructions (Signed)
It was so nice to see you today, I am sorry that you are hurting so much.   You have arthritis in your lower back along with pressure on your spinal cord (spinal stenosis). I think this is causing your back and leg pain.   I sent a referral to pain management to discuss injections on your back. Let me know if you don't hear from them in next week.   I will see you back in 6-8 weeks. Please do not hesitate to call if you have any questions or concerns. You can also message me in [place].   If you have not heard back about any of the tests/procedures in the next week, please call the office so we can help you get these things scheduled.   Geronimo Boot PA-C [phone]

## 2022-03-21 NOTE — Progress Notes (Signed)
Referring Physician:  Jonetta Osgood, NP [place],  [place] [id number]  Primary Physician:  Jonetta Osgood, NP  History of Present Illness: 03/21/2022 [name] has a history of breast CA, CKD, DM, HTN.   She is here today with a chief complaint of intermittent LBP with posterior bilateral leg pain to her feet that has become constant in last 2 weeks. She has known DM neuropathy and has intermittent numbness and tingling. She notes some weakness in her left leg. Grocery cart helps her walk better. Pain is worse with standing and walking. Some improvement with sitting on an ice pack.   No perineal numbness. No bowel or bladder issues. She has some pressure in her vaginal region.   She is taking lyrica for her neuropathy. She is on ultram prn for her back pain.    Conservative measures:  Physical therapy: did HHPT starting in September for 2-3 months Multimodal medical therapy including regular antiinflammatories: ultracet, lyrica Injections: No epidural steroid injections  Past Surgery: none  Kara [name] has no symptoms of cervical myelopathy.  The symptoms are causing a significant impact on the patient's life.   Review of Systems:  A 10 point review of systems is negative, except for the pertinent positives and negatives detailed in the HPI.  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[place])    Lumbago    Osteoarthrosis    Ovarian failure    Personal history of radiation therapy    Pneumonia 12/21/2021   covid with pneumonia( hospitalization)   SOB (shortness of breath)     Past  Surgical History: Past Surgical History:  Procedure Laterality Date   BILATERAL SALPINGECTOMY     BREAST BIOPSY Right 2014   neg- core   BREAST BIOPSY Right 12/21/2018   [place] bx, venus clip,  DUCTAL CARCINOMA IN SITU   BREAST BIOPSY Right 12/21/2018   [place] bx, ribbon clip,  FIBROEPITHELIAL PROLIFERATION WITH SCLEROSIS   BREAST BIOPSY Right 01/01/2019   Affirm bx "X" clip-path pending   BREAST DUCTAL SYSTEM EXCISION     BREAST EXCISIONAL BIOPSY Right 2014   neg   BREAST LUMPECTOMY Right 01/22/2019   path pending   CATARACT EXTRACTION     CHOLECYSTECTOMY     CYSTOSCOPY WITH STENT PLACEMENT Left 04/14/2015   Procedure: CYSTOSCOPY WITH STENT PLACEMENT;  Surgeon: Hollice Espy, MD;  Location: [facility];  Service: Urology;  Laterality: Left;   EXTRACORPOREAL SHOCK WAVE LITHOTRIPSY Left 09/29/2014   Procedure: EXTRACORPOREAL SHOCK WAVE LITHOTRIPSY (ESWL);  Surgeon: Hollice Espy, MD;  Location: [facility];  Service: Urology;  Laterality: Left;   EYE SURGERY     bilateral cataract   IRRIGATION AND DEBRIDEMENT ABSCESS Right 03/24/2019   Procedure: IRRIGATION AND DEBRIDEMENT ABSCESS RIGHT BREAST;  Surgeon: Herbert Pun, MD;  Location: [facility];  Service: General;  Laterality: Right;   LAPAROSCOPIC OOPHERECTOMY Left    PARTIAL MASTECTOMY WITH NEEDLE LOCALIZATION Right 01/22/2019   Procedure: PARTIAL MASTECTOMY WITH NEEDLE LOCALIZATION;  Surgeon: Herbert Pun, MD;  Location: [facility];  Service: General;  Laterality: Right;   TONSILLECTOMY     TUBAL LIGATION     URETEROSCOPY WITH HOLMIUM LASER LITHOTRIPSY Left 04/14/2015   Procedure: URETEROSCOPY WITH HOLMIUM LASER LITHOTRIPSY;  Surgeon: Hollice Espy, MD;  Location: [facility];  Service: Urology;  Laterality: Left;    Allergies: Allergies as of 03/21/2022 - Review Complete 03/21/2022  Allergen Reaction Noted   Amoxicillin Nausea And Vomiting 12/21/2021    Medications: Outpatient Encounter Medications as of 03/21/2022   Medication Sig   Aflibercept (EYLEA) 2 MG/0.05ML SOLN    aspirin EC 81 MG tablet Take 81 mg by mouth daily.    cholecalciferol (VITAMIN D) 1000 UNITS tablet Take 2,000 Units by mouth daily.   Dulaglutide (TRULICITY) 1.5 PJ/0.9TO SOPN Inject 1.5 mg into the skin once a week.   Ferrous Gluconate 324 (37.5 Fe) MG TABS Take 324 mg by mouth daily.   furosemide (LASIX) 20 MG tablet Take 2 tablets by mouth in the AM and take 1 tablet by mouth in the PM. May add 1 tablet to afternoon dose if swelling not improved.   insulin aspart (NOVOLOG FLEXPEN) 100 UNIT/ML FlexPen INJECT [place] 3 times daily with meals AS DIRECTED PER SLIDING SCALE. MAX DAILY DOSE IS 30 UNITS. DX E11.65.   insulin aspart (NOVOLOG FLEXPEN) 100 UNIT/ML FlexPen INJECT UNDER THE SKIN TWICE DAILY AS DIRECTED PER SLIDING SCALE. MAX DAILY DOSE IS 30 UNITS.   insulin detemir (LEVEMIR FLEXPEN) 100 UNIT/ML FlexPen INJECT UP TO 75 UNITS UNDER THE SKIN DAILY, TITRATE AS NEEDED.   Lactobacillus (PROBIOTIC ACIDOPHILUS) CAPS Take 1 capsule by mouth daily.   Multiple Vitamin (MULTIVITAMIN) tablet Take 1 tablet by mouth daily at 12 noon.   pantoprazole (PROTONIX) 40 MG tablet TAKE 1 TABLET BY MOUTH EVERY DAY   pregabalin (LYRICA) 100 MG capsule TAKE 1 CAPSULE BY MOUTH TWICE A DAY   rosuvastatin (CRESTOR) 20 MG tablet Take 1 tablet (20 mg total) by mouth daily.   tamoxifen (NOLVADEX) 20 MG tablet Take 1 tablet (20 mg total) by mouth daily.   traMADol-acetaminophen (ULTRACET) 37.5-325 MG tablet Take 1 tablet by mouth every 6 (six) hours as needed for moderate pain or severe pain.   [DISCONTINUED] SHINGRIX injection    [DISCONTINUED] chlorpheniramine-HYDROcodone (TUSSIONEX) 10-8 MG/5ML Take 5 mLs by mouth every 12 (twelve) hours as needed for cough. (Patient not taking: Reported on 03/21/2022)   [DISCONTINUED] clobetasol ointment (TEMOVATE) 0.05 % APPLY TO AFFECTED AREAS TWICE DAILY. AVOID APPLYING TO FACE, GROIN, AND AXILLA. USE AS DIRECTED. RISK OF SKIN  ATROPHY WITH LONG-TERM USE REVIEWED. (Patient not taking: Reported on 03/21/2022)   [DISCONTINUED] fluocinolone (SYNALAR) 0.01 % external solution Apply to affected areas ears and scalp 1-2 times a day as needed for itch. (Patient not taking: Reported on 03/21/2022)   [DISCONTINUED] hydrOXYzine (ATARAX/VISTARIL) 10 MG tablet TAKE 1-3 TABLETS BY MOUTH EVERY NIGHT AS NEEDED FOR ITCH. (Patient not taking: Reported on 03/21/2022)   [DISCONTINUED] ketoconazole (NIZORAL) 2 % shampoo Massage into scalp, let sit several minutes before rinsing. Use 2-3x/wk. (Patient not taking: Reported on 03/21/2022)   [DISCONTINUED] lisinopril (ZESTRIL) 10 MG tablet Take 10 mg by mouth daily. (Patient not taking: Reported on 03/21/2022)   [DISCONTINUED] Omega-3 Fatty Acids (FISH OIL OMEGA-3 PO) Take 25 mg by mouth daily. (Patient not taking: Reported on 03/21/2022)   [DISCONTINUED] ondansetron (ZOFRAN-ODT) 4 MG disintegrating tablet Take 1 tablet (4 mg total) by mouth every 8 (eight) hours as needed  for nausea or vomiting. Let tab dissolve in mouth, do not swallow (Patient not taking: Reported on 03/21/2022)   [DISCONTINUED] tacrolimus (PROTOPIC) 0.1 % ointment Apply topically 2 (two) times daily. Apply to body folds, face, groin as needed. (Patient not taking: Reported on 03/21/2022)   [DISCONTINUED] triamcinolone (KENALOG) 0.025 % cream Apply 1 application. topically 2 (two) times daily. (Patient not taking: Reported on 03/21/2022)   [DISCONTINUED] zinc sulfate 220 (50 Zn) MG capsule Take 1 capsule (220 mg total) by mouth daily. (Patient not taking: Reported on 03/21/2022)   No facility-administered encounter medications on file as of 03/21/2022.    Social History: Social History   Tobacco Use   Smoking status: Former    Packs/day: 1.00    Years: 30.00    Total pack years: 30.00    Types: Cigarettes    Quit date: 04/11/1995    Years since quitting: 26.9   Smokeless tobacco: Never  Vaping Use   Vaping Use: Never  used  Substance Use Topics   Alcohol use: No    Alcohol/week: 0.0 standard drinks of alcohol   Drug use: No    Family Medical History: Family History  Problem Relation Age of Onset   Breast cancer Maternal Grandmother 74   Colon cancer Mother    Lung cancer Father     Physical Examination: Vitals:   03/21/22 1334  BP: (!) 142/82    General: Patient is well developed, well nourished, calm, collected, and in no apparent distress. Attention to examination is appropriate.  Respiratory: Patient is breathing without any difficulty.   NEUROLOGICAL:     Awake, alert, oriented to person, place, and time.  Speech is clear and fluent. Fund of knowledge is appropriate.   Cranial Nerves: Pupils equal round and reactive to light.  Facial tone is symmetric.  Facial sensation is symmetric.  No lower lumbar tenderness.   No abnormal lesions on exposed skin.   She has pain with IR/ER of left > right hip.   Strength: Side Biceps Triceps Deltoid Interossei Grip Wrist Ext. Wrist Flex.  R '5 5 5 5 5 5 5  '$ L '5 5 5 5 5 5 5   '$ Side Iliopsoas Quads Hamstring PF DF EHL  R '5 5 5 5 5 5  '$ L +4 +4 +'4 5 5 5   '$ She has pain with above strength testing in both upper and lower extremities.  Reflexes are 2+ and symmetric at the biceps, triceps, brachioradialis, patella and achilles.   Hoffman's is absent.  Clonus is not present.   Bilateral upper and lower extremity sensation is intact to light touch, except she has diminished sensation from her ankles down into her feet.   She ambulates slowly with a cane.   Medical Decision Making  Imaging: Lumbar xrays dated 03/04/22:  Severe spondylosis with severe DDD L3-L4 and L4-L5. Slip at L4-L5 and L5-S1. Slip at L5-S1 is mobile on flexion/extension. Mild lumbar scoliosis.   MRI of lumbar spine dated 03/12/22:  Severe central spinal canal stenosis with compression of the cauda equina roots at L4-L5 due to 66m apophyseal anterolisthesis, diffuse pseudo  bulge of listhesis, ligamentum flavum thickening and facet arthrosis. Superimposed broad disc extrusion type herniation with superior migration. Severe discogenic narrowing of the neural foramina with impingement of the exiting L4 nerve roots.  Moderate central spinal canal stenosis at L3-L4 due to advanced intervertebral osteochrondrosis , total disc collapse and disc osteophyte complex formation that compresses the descending L4 nerve roots. Moderate discogenic  narrowing of the neural foramina with focal compression of the exiting L3 nerve roots.  Severe bilateral L5-S1 facet arthrosis with trace right joint effusion. Nominal apophyseal anterolisthesis. Asymmetric psuedo bulge of listhesis. Mild narrowing of the neural foramina abutting the exiting L5 nerve roots.   I have personally reviewed the images and agree with the above interpretation for the MRI (report scanned under media). Xray report is not available.   Assessment and Plan: Ms. Cilento is a pleasant 76 y.o. female with intermittent LBP with posterior bilateral leg pain to her feet that has become constant in last 2 weeks. She notes some weakness in her left leg. Grocery cart helps her walk better.   No bowel or bladder issues. No perineal numbness.   She has known slip at L4-L5 and dynamic slip at L5-S1 along with severe DDD L3-L5. She has moderate central stenosis L3-L4 and severe central stenosis L4-L5. She has some mild weakness in left leg some of which may be pain mediated.   She has pain in left > right hip with IR/ER.   Treatment options discussed with patient and following plan made:   - We reviewed her lumbar xrays and MRI. Discussed spinal stenosis at length.  - She did HHPT for 2-3 months in September. Pain started to get worse at the end. Will hold on further formal PT.  - PCP did referral to pain management. I also sent referral to pain management for lumbar injections. Will send message to Dr. Holley Raring to see if he can  get her in sooner rather than later.  - She would like to avoid surgery if possible, but if no improvement with above then we may need to consider.  - May need updated bone density done prior to any surgical discussion (would discuss with Dr. Izora Ribas first).  - Follow up with me in 6 weeks for recheck. Call with any concerns.   I spent a total of 30 minutes in face-to-face and non-face-to-face activities related to this patient's care toda including review of outside records, review of imaging, review of symptoms, physical exam, discussion of differential diagnosis, discussion of treatment options, and documentation.   Thank you for involving me in the care of this patient.   Geronimo Boot PA-C Dept. of Neurosurgery

## 2022-03-26 NOTE — Progress Notes (Unsigned)
Patient: Kara [name]  Service Category: E/M  Provider: Gaspar Cola, MD  DOB: [birth year]  DOS: 03/27/2022  Referring Provider: Jonetta Osgood, NP  MRN: [id number]  Setting: Ambulatory outpatient  PCP: Jonetta Osgood, NP  Type: New Patient  Specialty: Interventional Pain Management    Location: Office  Delivery: Face-to-face     Primary Reason(s) for Visit: Encounter for initial evaluation of one or more chronic problems (new to examiner) potentially causing chronic pain, and posing a threat to normal musculoskeletal function. (Level of risk: High) CC: No chief complaint on file.  HPI  Kara [name] is a 76 y.o. year old, female patient, who comes for the first time to our practice referred by Jonetta Osgood, NP for our initial evaluation of her chronic pain. She has Irritable bowel syndrome with diarrhea; Essential hypertension; Mixed hyperlipidemia; Uncontrolled type 2 diabetes mellitus with hyperglycemia ([place]); Inflammatory polyarthropathy ([place]); Benign essential tremor; Diabetic polyneuropathy associated with type 2 diabetes mellitus ([place]); Type 2 diabetes mellitus with diabetic neuropathy ([place]); Screening for breast cancer; Dyspnea on exertion; Dysuria; Ductal carcinoma in situ (DCIS) of right breast; Type 2 diabetes mellitus with hyperglycemia ([place]); GERD (gastroesophageal reflux disease); Rash; Acute kidney injury superimposed on stage IV chronic kidney disease ([place]); Fever; Cellulitis of right breast; CKD (chronic kidney disease), stage IV ([place]); Aortic atherosclerosis ([place]); Chest pain; Alopecia; Encounter for general adult medical examination with abnormal findings; Bilateral lower extremity edema; Atopic dermatitis; Synovial cyst of right popliteal space; DM II (diabetes mellitus, type II), controlled ([place]); Hyperparathyroidism due to renal insufficiency ([place]); Localized edema; Generalized weakness; Pneumonia due to COVID-19 virus; Acute respiratory failure with hypoxia ([place]);  Anemia; Obesity (BMI 30-39.9); Diverticulosis large intestine w/o perforation or abscess w/o bleeding; and Bullous pemphigoid on their problem list. Today she comes in for evaluation of her No chief complaint on file.  Pain Assessment: Location:     Radiating:   Onset:   Duration:   Quality:   Severity:  /10 (subjective, self-reported pain score)  Effect on ADL:   Timing:   Modifying factors:   BP:    HR:    Onset and Duration: {Hx; Onset and Duration:210120511} Cause of pain: {Hx; Cause:210120521} Severity: {Pain Severity:210120502} Timing: {Symptoms; Timing:210120501} Aggravating Factors: {Causes; Aggravating pain factors:210120507} Alleviating Factors: {Causes; Alleviating Factors:210120500} Associated Problems: {Hx; Associated problems:210120515} Quality of Pain: {Hx; Symptom quality or Descriptor:210120531} Previous Examinations or Tests: {Hx; Previous examinations or test:210120529} Previous Treatments: {Hx; Previous Treatment:210120503}  ***  Kara [name] was informed that I continue to offer evaluations and recommendations for medication management but I no longer take patients to write for their medications. I informed her that this visit is an evaluation only and that on the follow up appointment I will go over the my review of the case, the results of available tests, and assuming that there are no contraindications, we will provide her with information about possible interventional pain management options. At that time she will have the opportunity to decide whether or not to proceed with those therapies. In the event that Kara [name] decides not to go with those options, or prefers to stay away from interventional therapies, this will conclude our involvement in the case.   Historic Controlled Substance Pharmacotherapy Review  PMP and historical list of controlled substances: Tramadol/APAP 37.5/325 (# 120) 1 tab p.o. 4 times daily (last filled on 03/21/2022); pregabalin  100 mg (# 60) 1 cap p.o. twice daily (last filled on 02/15/2022) Most recently prescribed opioid analgesics:   Tramadol/APAP 37.5/325 (#  120) 1 tab p.o. 4 times daily MME/day: 30 mg/day  Historical Monitoring: The patient  reports no history of drug use. List of prior UDS Testing: No results found for: "MDMA", "COCAINSCRNUR", "PCPSCRNUR", "PCPQUANT", "CANNABQUANT", "THCU", "ETH", "CBDTHCR", "D8THCCBX", "D9THCCBX" Historical Background Evaluation: [place] PMP: PDMP reviewed during this encounter. Review of the past 64-month conducted.             PMP NARX Score Report:  Narcotic: 431 Sedative: 400 Stimulant: 000 [place] Department of public safety, offender search: (Editor, commissioningInformation) Non-contributory Risk Assessment Profile: Aberrant behavior: None observed or detected today Risk factors for fatal opioid overdose: None identified today PMP NARX Overdose Risk Score: 310 Fatal overdose hazard ratio (HR): Calculation deferred Non-fatal overdose hazard ratio (HR): Calculation deferred Risk of opioid abuse or dependence: 0.7-3.0% with doses ? 36 MME/day and 6.1-26% with doses ? 120 MME/day. Substance use disorder (SUD) risk level: See below Personal History of Substance Abuse (SUD-Substance use disorder):  Alcohol:    Illegal Drugs:    Rx Drugs:    ORT Risk Level calculation:    ORT Scoring interpretation table:  Score <3 = Low Risk for SUD  Score between 4-7 = Moderate Risk for SUD  Score >8 = High Risk for Opioid Abuse   PHQ-2 Depression Scale:  Total score:    PHQ-2 Scoring interpretation table: (Score and probability of major depressive disorder)  Score 0 = No depression  Score 1 = 15.4% Probability  Score 2 = 21.1% Probability  Score 3 = 38.4% Probability  Score 4 = 45.5% Probability  Score 5 = 56.4% Probability  Score 6 = 78.6% Probability   PHQ-9 Depression Scale:  Total score:    PHQ-9 Scoring interpretation table:  Score 0-4 = No depression  Score 5-9 = Mild depression   Score 10-14 = Moderate depression  Score 15-19 = Moderately severe depression  Score 20-27 = Severe depression (2.4 times higher risk of SUD and 2.89 times higher risk of overuse)   Pharmacologic Plan: As per protocol, I have not taken over any controlled substance management, pending the results of ordered tests and/or consults.            Initial impression: Pending review of available data and ordered tests.  Meds   Current Outpatient Medications:    Aflibercept (EYLEA) 2 MG/0.05ML SOLN, , Disp: , Rfl:    aspirin EC 81 MG tablet, Take 81 mg by mouth daily. , Disp: , Rfl:    cholecalciferol (VITAMIN D) 1000 UNITS tablet, Take 2,000 Units by mouth daily., Disp: , Rfl:    Dulaglutide (TRULICITY) 1.5 MDJ/4.9FWSOPN, Inject 1.5 mg into the skin once a week., Disp: 6 mL, Rfl: 4   Ferrous Gluconate 324 (37.5 Fe) MG TABS, Take 324 mg by mouth daily., Disp: , Rfl:    furosemide (LASIX) 20 MG tablet, Take 2 tablets by mouth in the AM and take 1 tablet by mouth in the PM. May add 1 tablet to afternoon dose if swelling not improved., Disp: 120 tablet, Rfl: 2   insulin aspart (NOVOLOG FLEXPEN) 100 UNIT/ML FlexPen, INJECT [place] 3 times daily with meals AS DIRECTED PER SLIDING SCALE. MAX DAILY DOSE IS 30 UNITS. DX E11.65., Disp: 15 mL, Rfl: 3   insulin aspart (NOVOLOG FLEXPEN) 100 UNIT/ML FlexPen, INJECT UNDER THE SKIN TWICE DAILY AS DIRECTED PER SLIDING SCALE. MAX DAILY DOSE IS 30 UNITS., Disp: 9 mL, Rfl: 2   insulin detemir (LEVEMIR FLEXPEN) 100 UNIT/ML FlexPen, INJECT UP TO 75 UNITS  UNDER THE SKIN DAILY, TITRATE AS NEEDED., Disp: 15 mL, Rfl: 2   Lactobacillus (PROBIOTIC ACIDOPHILUS) CAPS, Take 1 capsule by mouth daily., Disp: , Rfl:    Multiple Vitamin (MULTIVITAMIN) tablet, Take 1 tablet by mouth daily at 12 noon., Disp: , Rfl:    pantoprazole (PROTONIX) 40 MG tablet, TAKE 1 TABLET BY MOUTH EVERY DAY, Disp: 90 tablet, Rfl: 1   pregabalin (LYRICA) 100 MG capsule, TAKE 1 CAPSULE BY MOUTH TWICE A DAY, Disp:  60 capsule, Rfl: 2   rosuvastatin (CRESTOR) 20 MG tablet, Take 1 tablet (20 mg total) by mouth daily., Disp: 90 tablet, Rfl: 3   tamoxifen (NOLVADEX) 20 MG tablet, Take 1 tablet (20 mg total) by mouth daily., Disp: 90 tablet, Rfl: 3   traMADol-acetaminophen (ULTRACET) 37.5-325 MG tablet, Take 1 tablet by mouth every 6 (six) hours as needed for moderate pain or severe pain., Disp: 120 tablet, Rfl: 2  Imaging Review   Complexity Note: No results found under the [facility] record.                         ROS  Cardiovascular: {Hx; Cardiovascular History:210120525} Pulmonary or Respiratory: {Hx; Pumonary and/or Respiratory History:210120523} Neurological: {Hx; Neurological:210120504} Psychological-Psychiatric: {Hx; Psychological-Psychiatric History:210120512} Gastrointestinal: {Hx; Gastrointestinal:210120527} Genitourinary: {Hx; Genitourinary:210120506} Hematological: {Hx; Hematological:210120510} Endocrine: {Hx; Endocrine history:210120509} Rheumatologic: {Hx; Rheumatological:210120530} Musculoskeletal: {Hx; Musculoskeletal:210120528} Work History: {Hx; Work history:210120514}  Allergies  Kara [name] is allergic to amoxicillin.  Laboratory Chemistry Profile   Renal Lab Results  Component Value Date   BUN 32 (H) 12/27/2021   CREATININE 1.53 (H) 12/27/2021   BCR 20 11/06/2021   GFRAA 39 (L) 08/16/2019   GFRNONAA 35 (L) 12/27/2021   SPECGRAV 1.010 10/11/2021   PHUR 5.0 10/11/2021   PROTEINUR NEGATIVE 12/27/2021     Electrolytes Lab Results  Component Value Date   NA 143 12/27/2021   K 3.4 (L) 12/27/2021   CL 106 12/27/2021   CALCIUM 8.8 (L) 12/27/2021   MG 2.0 12/27/2021   PHOS 3.3 12/27/2021     Hepatic Lab Results  Component Value Date   AST 34 12/27/2021   ALT 37 12/27/2021   ALBUMIN 2.6 (L) 12/27/2021   ALKPHOS 59 12/27/2021     ID Lab Results  Component Value Date   SARSCOV2NAA POSITIVE (A) 12/27/2021     Bone Lab Results   Component Value Date   VD25OH 41.7 11/06/2021     Endocrine Lab Results  Component Value Date   GLUCOSE 161 (H) 12/27/2021   GLUCOSEU NEGATIVE 12/27/2021   HGBA1C 6.7 (A) 01/31/2022   TSH 1.780 10/16/2020   FREET4 1.25 10/16/2020   CRTSLPL 14.7 12/22/2021     Neuropathy Lab Results  Component Value Date   VITAMINB12 739 11/06/2021   FOLATE >20.0 11/06/2021   HGBA1C 6.7 (A) 01/31/2022     CNS No results found for: "COLORCSF", "APPEARCSF", "RBCCOUNTCSF", "WBCCSF", "POLYSCSF", "LYMPHSCSF", "EOSCSF", "PROTEINCSF", "GLUCCSF", "JCVIRUS", "CSFOLI", "IGGCSF", "LABACHR", "ACETBL"   Inflammation (CRP: Acute  ESR: Chronic) Lab Results  Component Value Date   CRP 0.8 12/27/2021   LATICACIDVEN 0.9 12/23/2021     Rheumatology Lab Results  Component Value Date   LABURIC 4.9 07/05/2015     Coagulation Lab Results  Component Value Date   INR 1.2 03/21/2019   LABPROT 14.6 03/21/2019   APTT 42 (H) 03/21/2019   PLT 198 12/27/2021   DDIMER 1.53 (H) 12/27/2021     Cardiovascular Lab Results  Component Value  Date   BNP 146.0 (H) 12/22/2021   HGB 9.7 (L) 12/27/2021   HCT 30.6 (L) 12/27/2021     Screening Lab Results  Component Value Date   SARSCOV2NAA POSITIVE (A) 12/27/2021     Cancer No results found for: "CEA", "CA125", "LABCA2"   Allergens No results found for: "ALMOND", "APPLE", "ASPARAGUS", "AVOCADO", "BANANA", "BARLEY", "BASIL", "BAYLEAF", "GREENBEAN", "LIMABEAN", "WHITEBEAN", "BEEFIGE", "REDBEET", "BLUEBERRY", "BROCCOLI", "CABBAGE", "MELON", "CARROT", "CASEIN", "CASHEWNUT", "CAULIFLOWER", "CELERY"     Note: Lab results reviewed.  [place]  Drug: Kara [name]  reports no history of drug use. Alcohol:  reports no history of alcohol use. Tobacco:  reports that she quit smoking about 26 years ago. Her smoking use included cigarettes. She has a 30.00 pack-year smoking history. She has never used smokeless tobacco. Medical:  has a past medical history of Anemia,  Arthropathy, Benign neoplasm of breast, Breast cancer ([place]), Chest pain, Chronic kidney disease, Diabetes ([place]), Diabetic retinopathy ([place]), Dyspnea, Edema, History of kidney stones, Hyperlipemia, Hypersomnia with sleep apnea, Hypertension, 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[place]), Lumbago, Osteoarthrosis, Ovarian failure, Personal history of radiation therapy, Pneumonia (12/21/2021), and SOB (shortness of breath). Family: family history includes Breast cancer (age of onset: 55) in her maternal grandmother; Colon cancer in her mother; Lung cancer in her father.  Past Surgical History:  Procedure Laterality Date   BILATERAL SALPINGECTOMY     BREAST BIOPSY Right 2014   neg- core   BREAST BIOPSY Right 12/21/2018   [place] bx, venus clip,  DUCTAL CARCINOMA IN SITU   BREAST BIOPSY Right 12/21/2018   [place] bx, ribbon clip,  FIBROEPITHELIAL PROLIFERATION WITH SCLEROSIS   BREAST BIOPSY Right 01/01/2019   Affirm bx "X" clip-path pending   BREAST DUCTAL SYSTEM EXCISION     BREAST EXCISIONAL BIOPSY Right 2014   neg   BREAST LUMPECTOMY Right 01/22/2019   path pending   CATARACT EXTRACTION     CHOLECYSTECTOMY     CYSTOSCOPY WITH STENT PLACEMENT Left 04/14/2015   Procedure: CYSTOSCOPY WITH STENT PLACEMENT;  Surgeon: Hollice Espy, MD;  Location: [facility];  Service: Urology;  Laterality: Left;   EXTRACORPOREAL SHOCK WAVE LITHOTRIPSY Left 09/29/2014   Procedure: EXTRACORPOREAL SHOCK WAVE LITHOTRIPSY (ESWL);  Surgeon: Hollice Espy, MD;  Location: [facility];  Service: Urology;  Laterality: Left;   EYE SURGERY     bilateral cataract   IRRIGATION AND DEBRIDEMENT ABSCESS Right 03/24/2019   Procedure: IRRIGATION AND DEBRIDEMENT ABSCESS RIGHT BREAST;  Surgeon: Herbert Pun, MD;  Location: [facility];  Service: General;  Laterality: Right;   LAPAROSCOPIC OOPHERECTOMY Left    PARTIAL MASTECTOMY WITH NEEDLE LOCALIZATION Right 01/22/2019   Procedure: PARTIAL MASTECTOMY WITH NEEDLE LOCALIZATION;  Surgeon:  Herbert Pun, MD;  Location: [facility];  Service: General;  Laterality: Right;   TONSILLECTOMY     TUBAL LIGATION     URETEROSCOPY WITH HOLMIUM LASER LITHOTRIPSY Left 04/14/2015   Procedure: URETEROSCOPY WITH HOLMIUM LASER LITHOTRIPSY;  Surgeon: Hollice Espy, MD;  Location: [facility];  Service: Urology;  Laterality: Left;   Active Ambulatory Problems    Diagnosis Date Noted   Irritable bowel syndrome with diarrhea 12/07/2017   Essential hypertension 12/07/2017   Mixed hyperlipidemia 12/07/2017   Uncontrolled type 2 diabetes mellitus with hyperglycemia ([place]) 03/30/2018   Inflammatory polyarthropathy ([place]) 03/30/2018   Benign essential tremor 03/30/2018   Diabetic polyneuropathy associated with type 2 diabetes mellitus ([place]) 06/29/2018   Type 2 diabetes mellitus with diabetic neuropathy ([place]) 06/29/2018   Screening for breast cancer 10/03/2018   Dyspnea  on exertion 10/03/2018   Dysuria 10/03/2018   Ductal carcinoma in situ (DCIS) of right breast 01/01/2019   Type 2 diabetes mellitus with hyperglycemia ([place]) 01/07/2019   GERD (gastroesophageal reflux disease) 03/21/2019   Rash 03/21/2019   Acute kidney injury superimposed on stage IV chronic kidney disease ([place]) 03/21/2019   Fever 03/22/2019   Cellulitis of right breast 03/22/2019   CKD (chronic kidney disease), stage IV ([place]) 03/22/2019   Aortic atherosclerosis ([place]) 03/22/2019   Chest pain 08/16/2019   Alopecia 08/16/2019   Encounter for general adult medical examination with abnormal findings 10/17/2019   Bilateral lower extremity edema 10/17/2019   Atopic dermatitis 10/17/2019   Synovial cyst of right popliteal space 12/18/2019   DM II (diabetes mellitus, type II), controlled ([place]) 10/17/2020   Hyperparathyroidism due to renal insufficiency ([place]) 09/30/2019   Localized edema 09/30/2019   Generalized weakness 12/22/2021   Pneumonia due to COVID-19 virus 12/22/2021   Acute respiratory failure with hypoxia ([place])  12/22/2021   Anemia 12/22/2021   Obesity (BMI 30-39.9) 12/22/2021   Diverticulosis large intestine w/o perforation or abscess w/o bleeding 12/23/2021   Bullous pemphigoid 01/09/2022   Resolved Ambulatory Problems    Diagnosis Date Noted   Diabetes mellitus without complication ([place]) 62/70/3500   Severe sepsis ([place]) 03/21/2019   Diarrhea due to COVID-19 12/22/2021   Acute on chronic diastolic CHF (congestive heart failure) ([place]) 12/25/2021   Past Medical History:  Diagnosis Date   Arthropathy    Benign neoplasm of breast    Breast cancer ([place])    Chronic kidney disease    Diabetes ([place])    Diabetic retinopathy ([place])    Dyspnea    Edema    History of kidney stones    Hyperlipemia    Hypersomnia with sleep apnea    Hypertension    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[facility])    Lumbago    Osteoarthrosis    Ovarian failure    Personal history of radiation therapy    Pneumonia 12/21/2021   SOB (shortness of breath)    Constitutional Exam  General appearance: Well nourished, well developed, and well hydrated. In no apparent acute distress There were no vitals filed for this visit. BMI Assessment: Estimated body mass index is 29.18 kg/m as calculated from the following:   Height as of 03/21/22: _0  (1.626 m).   Weight as of 03/21/22: 170 lb (77.1 kg).  BMI interpretation table: BMI level Category Range association with higher incidence of chronic pain  <18 kg/m2 Underweight   18.5-24.9 kg/m2 Ideal body weight   25-29.9 kg/m2 Overweight Increased incidence by 20%  30-34.9 kg/m2 Obese (Class I) Increased incidence by 68%  35-39.9 kg/m2 Severe obesity (Class II) Increased incidence by 136%  >40 kg/m2 Extreme obesity (Class III) Increased incidence by 254%   Patient's current BMI Ideal Body weight  There is no height or weight on file to calculate BMI. Ideal body weight: 54.7 kg (120 lb 9.5 oz) Adjusted ideal body weight: 63.7 kg (140 lb 5.7 oz)   BMI Readings from Last 4  Encounters:  03/21/22 29.18 kg/m  03/19/22 29.35 kg/m  01/31/22 30.00 kg/m  01/21/22 29.70 kg/m   Wt Readings from Last 4 Encounters:  03/21/22 170 lb (77.1 kg)  03/19/22 171 lb (77.6 kg)  01/31/22 174 lb 12.8 oz (79.3 kg)  01/21/22 173 lb (78.5 kg)    Psych/Mental status: Alert, oriented x 3 (person, place, & time)       Eyes: PERLA  Respiratory: No evidence of acute respiratory distress  Assessment  Primary Diagnosis & Pertinent Problem List: The primary encounter diagnosis was Chronic pain syndrome. Diagnoses of Pharmacologic therapy, Disorder of skeletal system, Problems influencing health status, and Diabetic polyneuropathy associated with type 2 diabetes mellitus ([place]) were also pertinent to this visit.  Visit Diagnosis (New problems to examiner): 1. Chronic pain syndrome   2. Pharmacologic therapy   3. Disorder of skeletal system   4. Problems influencing health status   5. Diabetic polyneuropathy associated with type 2 diabetes mellitus ([place])    Plan of Care (Initial workup plan)  Note: Kara [name] was reminded that as per protocol, today's visit has been an evaluation only. We have not taken over the patient's controlled substance management.  Problem-specific plan: No problem-specific Assessment & Plan notes found for this encounter.  Lab Orders  No laboratory test(s) ordered today   Imaging Orders  No imaging studies ordered today   Referral Orders  No referral(s) requested today   Procedure Orders    No procedure(s) ordered today   Pharmacotherapy (current): Medications ordered:  No orders of the defined types were placed in this encounter.  Medications administered during this visit: Kara [name] had no medications administered during this visit.   Analgesic Pharmacotherapy:  Opioid Analgesics: For patients currently taking or requesting to take opioid analgesics, in accordance with [place], we will assess their  risks and indications for the use of these substances. After completing our evaluation, we may offer recommendations, but we no longer take patients for medication management. The prescribing physician will ultimately decide, based on his/her training and level of comfort whether to adopt any of the recommendations, including whether or not to prescribe such medicines.  Membrane stabilizer: To be determined at a later time  Muscle relaxant: To be determined at a later time  NSAID: To be determined at a later time  Other analgesic(s): To be determined at a later time   Interventional management options: Kara [name] was informed that there is no guarantee that she would be a candidate for interventional therapies. The decision will be based on the results of diagnostic studies, as well as Kara [name]'s risk profile.  Procedure(s) under consideration:  Pending results of ordered studies      Interventional Therapies  Risk Factors  Complex Considerations:  N/A   Planned  Pending:   See above for possible orders   Under consideration:   Pending completion of evaluation   Completed:   None at this time   Completed by other providers:   None at this time   Therapeutic  Palliative (PRN) options:   None established      Provider-requested follow-up: No follow-ups on file.  Future Appointments  Date Time Provider [place]  03/27/2022  1:00 PM Milinda Pointer, MD [facility] None  05/03/2022  9:20 AM Jonetta Osgood, NP [facility] None  05/16/2022  1:30 PM Geronimo Boot, PA-C [facility] None  06/11/2022  9:45 AM [name], MD [facility] None  08/13/2022  3:30 PM Lloyd Huger, MD [facility] None  10/17/2022 11:00 AM Jonetta Osgood, NP [facility] None    Note by: Gaspar Cola, MD Date: 03/27/2022; Time: 4:23 PM

## 2022-03-27 ENCOUNTER — Ambulatory Visit (HOSPITAL_BASED_OUTPATIENT_CLINIC_OR_DEPARTMENT_OTHER): Payer: Medicare HMO | Admitting: Pain Medicine

## 2022-03-27 ENCOUNTER — Ambulatory Visit
Admission: RE | Admit: 2022-03-27 | Discharge: 2022-03-27 | Disposition: A | Discharge status: Home / Self Care | Payer: Medicare HMO | Source: Ambulatory Visit | Attending: Pain Medicine | Admitting: Pain Medicine

## 2022-03-27 ENCOUNTER — Encounter: Payer: Self-pay | Admitting: Pain Medicine

## 2022-03-27 VITALS — BP 189/71 | HR 93 | Temp 97.3°F | Resp 16 | Ht 64.0 in | Wt 169.0 lb

## 2022-03-27 DIAGNOSIS — M5116 Intervertebral disc disorders with radiculopathy, lumbar region: Secondary | ICD-10-CM

## 2022-03-27 DIAGNOSIS — R102 Pelvic and perineal pain: Secondary | ICD-10-CM | POA: Insufficient documentation

## 2022-03-27 DIAGNOSIS — M5416 Radiculopathy, lumbar region: Secondary | ICD-10-CM | POA: Insufficient documentation

## 2022-03-27 DIAGNOSIS — M5136 Other intervertebral disc degeneration, lumbar region: Secondary | ICD-10-CM | POA: Diagnosis not present

## 2022-03-27 DIAGNOSIS — M899 Disorder of bone, unspecified: Secondary | ICD-10-CM

## 2022-03-27 DIAGNOSIS — Z79899 Other long term (current) drug therapy: Secondary | ICD-10-CM | POA: Insufficient documentation

## 2022-03-27 DIAGNOSIS — M5410 Radiculopathy, site unspecified: Secondary | ICD-10-CM

## 2022-03-27 DIAGNOSIS — R937 Abnormal findings on diagnostic imaging of other parts of musculoskeletal system: Secondary | ICD-10-CM

## 2022-03-27 DIAGNOSIS — M25552 Pain in left hip: Secondary | ICD-10-CM

## 2022-03-27 DIAGNOSIS — M25559 Pain in unspecified hip: Secondary | ICD-10-CM | POA: Insufficient documentation

## 2022-03-27 DIAGNOSIS — R1032 Left lower quadrant pain: Secondary | ICD-10-CM | POA: Insufficient documentation

## 2022-03-27 DIAGNOSIS — G8929 Other chronic pain: Secondary | ICD-10-CM | POA: Insufficient documentation

## 2022-03-27 DIAGNOSIS — M4316 Spondylolisthesis, lumbar region: Secondary | ICD-10-CM | POA: Insufficient documentation

## 2022-03-27 DIAGNOSIS — M5442 Lumbago with sciatica, left side: Secondary | ICD-10-CM | POA: Insufficient documentation

## 2022-03-27 DIAGNOSIS — E1142 Type 2 diabetes mellitus with diabetic polyneuropathy: Secondary | ICD-10-CM | POA: Insufficient documentation

## 2022-03-27 DIAGNOSIS — M4317 Spondylolisthesis, lumbosacral region: Secondary | ICD-10-CM | POA: Insufficient documentation

## 2022-03-27 DIAGNOSIS — M4186 Other forms of scoliosis, lumbar region: Secondary | ICD-10-CM | POA: Diagnosis not present

## 2022-03-27 DIAGNOSIS — G894 Chronic pain syndrome: Secondary | ICD-10-CM

## 2022-03-27 DIAGNOSIS — Z789 Other specified health status: Secondary | ICD-10-CM | POA: Insufficient documentation

## 2022-03-27 NOTE — Patient Instructions (Addendum)
Epidural Steroid Injection  An epidural steroid injection is a shot of steroid medicine, also called cortisone, and a numbing medicine that is given into the epidural space. This space is between the spinal cord and the bones of the back. This shot helps relieve pain caused by an irritated or swollen nerve root. The amount of pain relief you get from the injection depends on what is causing the nerve to be swollen and irritated, and how long your pain lasts. You may have a period of slightly more pain after your injection, before the steroid medicine takes effect. This medicine usually starts working within 1-3 days. In some cases, you might need 7-10 days to feel the full effect. Tell your health care provider about: Any allergies you have. All medicines you are taking, including vitamins, herbs, eye drops, creams, and over-the-counter medicines. Any problems you or family members have had with anesthetic medicines. Any bleeding problems you have. Any surgeries you have had. Any medical conditions you have. Whether you are pregnant or may be pregnant. What are the risks? Your health care provider will talk with you about risks. These may include: Headache. Bleeding. Infection. Allergic reaction to medicines or dyes. Nerve damage. Not being able to move (paralysis). This is rare. What happens before the procedure? Medicines You may be given medicines to lower anxiety. Ask your health care provider about: Changing or stopping your regular medicines. These include any diabetes medicines or blood thinners you take. Taking medicines such as aspirin and ibuprofen. These medicines can thin your blood. Do not take them unless your health care provider tells you to. Taking over-the-counter medicines, vitamins, herbs, and supplements. General instructions Follow instructions from your health care provider about what you may eat and drink. Ask your health care provider what steps will be taken to  help prevent infection. If you will be going home right after the procedure, plan to have a responsible adult: Take you home from the hospital or clinic. You will not be allowed to drive. Care for you for the time you are told. What happens during the procedure?  An IV will be inserted into one of your veins. You may be given a sedative. This helps you relax. You will be asked to lie on your side or sit. The injection site will be cleaned. An X-ray machine will be used to guide the needle as close as possible to the nerve causing pain. A needle will be put through your skin into the epidural space. This may cause you some discomfort. Contrast dye may be injected at the site to make sure that the steroid medicine will be sent to the exact place it needs to go. The steroid medicine and a numbing medicine (local anesthesia) will be injected into the epidural space for pain relief. The needle and IV will be removed. A bandage (dressing) will be put over the injection site. The procedure may vary among health care providers and hospitals. What happens after the procedure? Your blood pressure, heart rate, breathing rate, and blood oxygen level will be monitored until you leave the hospital or clinic. Your arm or leg may feel weak or numb for a few hours. Summary An epidural steroid injection is a shot of steroid medicine and a numbing medicine that is given into the epidural space. The shot helps relieve pain caused by an irritated or swollen nerve root. The steroid medicine usually starts working within 1-3 days. In some cases, you might need 7-10 days to feel  the full effect. This information is not intended to replace advice given to you by your health care provider. Make sure you discuss any questions you have with your health care provider. Document Revised: 07/31/2021 Document Reviewed: 07/31/2021 Elsevier Patient Education  [place].  ______________________________________________________________________  Preparing for your procedure  During your procedure appointment there will be: No Prescription Refills. No disability issues to discussed. No medication changes or discussions.  Instructions: Food intake: Avoid eating anything solid for at least 8 hours prior to your procedure. Clear liquid intake: You may take clear liquids such as water up to 2 hours prior to your procedure. (No carbonated drinks. No soda.) Transportation: Unless otherwise stated by your physician, bring a driver. Morning Medicines: Except for blood thinners, take all of your other morning medications with a sip of water. Make sure to take your heart and blood pressure medicines. If your blood pressure's lower number is above 100, the case will be rescheduled. Blood thinners: If you take a blood thinner, but were not instructed to stop it, call our office [phone] and ask to talk to a nurse. Not stopping a blood thinner prior to certain procedures could lead to serious complications. Diabetics on insulin: Notify the staff so that you can be scheduled 1st case in the morning. If your diabetes requires high dose insulin, take only  of your normal insulin dose the morning of the procedure and notify the staff that you have done so. Preventing infections: Shower with an antibacterial soap the morning of your procedure.  Build-up your immune system: Take 1000 mg of Vitamin C with every meal (3 times a day) the day prior to your procedure. Antibiotics: Inform the nursing staff if you are taking any antibiotics or if you have any conditions that may require antibiotics prior to procedures. (Example: recent joint implants)   Pregnancy: If you are pregnant make sure to notify the nursing staff. Not doing so may result in injury to the fetus, including death.  Sickness: If you have a cold, fever, or any active infections, call and cancel or reschedule  your procedure. Receiving steroids while having an infection may result in complications. Arrival: You must be in the facility at least 30 minutes prior to your scheduled procedure. Tardiness: Your scheduled time is also the cutoff time. If you do not arrive at least 15 minutes prior to your procedure, you will be rescheduled.  Children: Do not bring any children with you. Make arrangements to keep them home. Dress appropriately: There is always a possibility that your clothing may get soiled. Avoid long dresses. Valuables: Do not bring any jewelry or valuables.  Reasons to call and reschedule or cancel your procedure: (Following these recommendations will minimize the risk of a serious complication.) Surgeries: Avoid having procedures within 2 weeks of any surgery. (Avoid for 2 weeks before or after any surgery). Flu Shots: Avoid having procedures within 2 weeks of a flu shots or . (Avoid for 2 weeks before or after immunizations). Barium: Avoid having a procedure within 7-10 days after having had a radiological study involving the use of radiological contrast. (Myelograms, Barium swallow or enema study). Heart attacks: Avoid any elective procedures or surgeries for the initial 6 months after a "Myocardial Infarction" (Heart Attack). Blood thinners: It is imperative that you stop these medications before procedures. Let us know if you if you take any blood thinner.  Infection: Avoid procedures during or within two weeks of an infection (including chest colds  or gastrointestinal problems). Symptoms associated with infections include: Localized redness, fever, chills, night sweats or profuse sweating, burning sensation when voiding, cough, congestion, stuffiness, runny nose, sore throat, diarrhea, nausea, vomiting, cold or Flu symptoms, recent or current infections. It is specially important if the infection is over the area that we intend to treat. Heart and lung problems: Symptoms that may suggest an  active cardiopulmonary problem include: cough, chest pain, breathing difficulties or shortness of breath, dizziness, ankle swelling, uncontrolled high or unusually low blood pressure, and/or palpitations. If you are experiencing any of these symptoms, cancel your procedure and contact your primary care physician for an evaluation.  Remember:  Regular Business hours are:  Monday to Thursday 8:00 AM to 4:00 PM  Provider's Schedule: Milinda Pointer, MD:  Procedure days: Tuesday and Thursday 7:30 AM to 4:00 PM  Gillis Santa, MD:  Procedure days: Monday and Wednesday 7:30 AM to 4:00 PM  ______________________________________________________________________    ____________________________________________________________________________________________  General Risks and Possible Complications  Patient Responsibilities: It is important that you read this as it is part of your informed consent. It is our duty to inform you of the risks and possible complications associated with treatments offered to you. It is your responsibility as a patient to read this and to ask questions about anything that is not clear or that you believe was not covered in this document.  Patient's Rights: You have the right to refuse treatment. You also have the right to change your mind, even after initially having agreed to have the treatment done. However, under this last option, if you wait until the last second to change your mind, you may be charged for the materials used up to that point.  Introduction: Medicine is not an Chief Strategy Officer. Everything in Medicine, including the lack of treatment(s), carries the potential for danger, harm, or loss (which is by definition: Risk). In Medicine, a complication is a secondary problem, condition, or disease that can aggravate an already existing one. All treatments carry the risk of possible complications. The fact that a side effects or complications occurs, does not imply  that the treatment was conducted incorrectly. It must be clearly understood that these can happen even when everything is done following the highest safety standards.  No treatment: You can choose not to proceed with the proposed treatment alternative. The "PRO(s)" would include: avoiding the risk of complications associated with the therapy. The "CON(s)" would include: not getting any of the treatment benefits. These benefits fall under one of three categories: diagnostic; therapeutic; and/or palliative. Diagnostic benefits include: getting information which can ultimately lead to improvement of the disease or symptom(s). Therapeutic benefits are those associated with the successful treatment of the disease. Finally, palliative benefits are those related to the decrease of the primary symptoms, without necessarily curing the condition (example: decreasing the pain from a flare-up of a chronic condition, such as incurable terminal cancer).  General Risks and Complications: These are associated to most interventional treatments. They can occur alone, or in combination. They fall under one of the following six (6) categories: no benefit or worsening of symptoms; bleeding; infection; nerve damage; allergic reactions; and/or death. No benefits or worsening of symptoms: In Medicine there are no guarantees, only probabilities. No healthcare provider can ever guarantee that a medical treatment will work, they can only state the probability that it may. Furthermore, there is always the possibility that the condition may worsen, either directly, or indirectly, as a consequence of the treatment. Bleeding: This  is more common if the patient is taking a blood thinner, either prescription or over the counter (example: Goody Powders, Fish oil, Aspirin, Garlic, etc.), or if suffering a condition associated with impaired coagulation (example: Hemophilia, cirrhosis of the liver, low platelet counts, etc.). However, even if you  do not have one on these, it can still happen. If you have any of these conditions, or take one of these drugs, make sure to notify your treating physician. Infection: This is more common in patients with a compromised immune system, either due to disease (example: diabetes, cancer, human immunodeficiency virus [HIV], etc.), or due to medications or treatments (example: therapies used to treat cancer and rheumatological diseases). However, even if you do not have one on these, it can still happen. If you have any of these conditions, or take one of these drugs, make sure to notify your treating physician. Nerve Damage: This is more common when the treatment is an invasive one, but it can also happen with the use of medications, such as those used in the treatment of cancer. The damage can occur to small secondary nerves, or to large primary ones, such as those in the spinal cord and brain. This damage may be temporary or permanent and it may lead to impairments that can range from temporary numbness to permanent paralysis and/or brain death. Allergic Reactions: Any time a substance or material comes in contact with our body, there is the possibility of an allergic reaction. These can range from a mild skin rash (contact dermatitis) to a severe systemic reaction (anaphylactic reaction), which can result in death. Death: In general, any medical intervention can result in death, most of the time due to an unforeseen complication. ____________________________________________________________________________________________    ____________________________________________________________________________________________  New Patients  Welcome to [facility] [place].   Initial Visit The first or initial visit consists of an evaluation only.   Interventional pain management.  We offer therapies other than opioid controlled substances to manage chronic  pain. These include, but are not limited to, diagnostic, therapeutic, and palliative specialized injection therapies (i.e.: Epidural Steroids, Facet Blocks, etc.). We specialize in a variety of nerve blocks as well as radiofrequency treatments. We offer pain implant evaluations and trials, as well as follow up management. In addition we also provide a variety joint injections, including Viscosupplementation (AKA: Gel Therapy).  Prescription Pain Medication We provide evaluations for/of pharmacologic therapies. Recommendations will follow CDC Guidelines.  We no longer take patients for long-term medication management. We will not be taking over your pain medications.  ____________________________________________________________________________________________    ____________________________________________________________________________________________  Patient Information update  To: All of our patients.  Re: Name change.  It has been made official that our current name, "[facility]"   will soon be changed to "[facility]".   The purpose of this change is to eliminate any confusion created by the concept of our practice being a "Medication Management Pain Clinic". In the past this has led to the misconception that we treat pain primarily by the use of prescription medications.  Nothing can be farther from the truth.   Understanding PAIN MANAGEMENT: To further understand what our practice does, you first have to understand that "Pain Management" is a subspecialty that requires additional training once a physician has completed their specialty training, which can be in either Anesthesia, Neurology, Psychiatry, or Physical Medicine and Rehabilitation (PMR). Each one of these  contributes to the final approach taken by each physician to the management of their patient's pain. To be a "Pain  Management Specialist" you must have first completed one of the specialty trainings below.  Anesthesiologists - trained in clinical pharmacology and interventional techniques such as nerve blockade and regional as well as central neuroanatomy. They are trained to block pain before, during, and after surgical interventions.  Neurologists - trained in the diagnosis and pharmacological treatment of complex neurological conditions, such as Multiple Sclerosis, Parkinson's, spinal cord injuries, and other systemic conditions that may be associated with symptoms that may include but are not limited to pain. They tend to rely primarily on the treatment of chronic pain using prescription medications.  Psychiatrist - trained in conditions affecting the psychosocial wellbeing of patients including but not limited to depression, anxiety, schizophrenia, personality disorders, addiction, and other substance use disorders that may be associated with chronic pain. They tend to rely primarily on the treatment of chronic pain using prescription medications.   Physical Medicine and Rehabilitation (PMR) physicians, also known as physiatrists - trained to treat a wide variety of medical conditions affecting the brain, spinal cord, nerves, bones, joints, ligaments, muscles, and tendons. Their training is primarily aimed at treating patients that have suffered injuries that have caused severe physical impairment. Their training is primarily aimed at the physical therapy and rehabilitation of those patients. They may also work alongside orthopedic surgeons or neurosurgeons using their expertise in assisting surgical patients to recover after their surgeries.  INTERVENTIONAL PAIN MANAGEMENT is sub-subspecialty of Pain Management.  Our physicians are Board-certified in Anesthesia, Pain Management, and Interventional Pain Management.  This meaning that not only have they been trained and Board-certified in their specialty of  Anesthesia, and subspecialty of Pain Management, but they have also received further training in the sub-subspecialty of Interventional Pain Management, in order to become Board-certified as INTERVENTIONAL PAIN MANAGEMENT SPECIALIST.    Mission: Our goal is to use our skills in  [place] as alternatives to the chronic use of prescription opioid medications for the treatment of pain. To make this more clear, we have changed our name to reflect what we do and offer. We will continue to offer medication management assessment and recommendations, but we will not be taking over any patient's medication management.  ____________________________________________________________________________________________

## 2022-03-27 NOTE — Progress Notes (Signed)
Safety precautions to be maintained throughout the outpatient stay will include: orient to surroundings, keep bed in low position, maintain call bell within reach at all times, provide assistance with transfer out of bed and ambulation.  

## 2022-04-01 ENCOUNTER — Telehealth: Payer: Self-pay | Admitting: Pain Medicine

## 2022-04-01 ENCOUNTER — Ambulatory Visit
Admission: RE | Admit: 2022-04-01 | Discharge: 2022-04-01 | Disposition: A | Discharge status: Home / Self Care | Payer: Medicare HMO | Source: Ambulatory Visit | Attending: Pain Medicine | Admitting: Pain Medicine

## 2022-04-01 ENCOUNTER — Ambulatory Visit
Admission: RE | Admit: 2022-04-01 | Discharge: 2022-04-01 | Disposition: A | Discharge status: Home / Self Care | Payer: Medicare HMO | Attending: Pain Medicine | Admitting: Pain Medicine

## 2022-04-01 DIAGNOSIS — M5126 Other intervertebral disc displacement, lumbar region: Secondary | ICD-10-CM | POA: Insufficient documentation

## 2022-04-01 DIAGNOSIS — M4317 Spondylolisthesis, lumbosacral region: Secondary | ICD-10-CM | POA: Insufficient documentation

## 2022-04-01 DIAGNOSIS — M47816 Spondylosis without myelopathy or radiculopathy, lumbar region: Secondary | ICD-10-CM | POA: Diagnosis present

## 2022-04-01 DIAGNOSIS — M5416 Radiculopathy, lumbar region: Secondary | ICD-10-CM | POA: Diagnosis not present

## 2022-04-01 DIAGNOSIS — R1032 Left lower quadrant pain: Secondary | ICD-10-CM | POA: Insufficient documentation

## 2022-04-01 DIAGNOSIS — M2540 Effusion, unspecified joint: Secondary | ICD-10-CM | POA: Diagnosis present

## 2022-04-01 DIAGNOSIS — R102 Pelvic and perineal pain: Secondary | ICD-10-CM | POA: Insufficient documentation

## 2022-04-01 DIAGNOSIS — G834 Cauda equina syndrome: Secondary | ICD-10-CM | POA: Insufficient documentation

## 2022-04-01 DIAGNOSIS — M5442 Lumbago with sciatica, left side: Secondary | ICD-10-CM | POA: Insufficient documentation

## 2022-04-01 DIAGNOSIS — M48062 Spinal stenosis, lumbar region with neurogenic claudication: Secondary | ICD-10-CM | POA: Diagnosis present

## 2022-04-01 DIAGNOSIS — M47819 Spondylosis without myelopathy or radiculopathy, site unspecified: Secondary | ICD-10-CM | POA: Diagnosis present

## 2022-04-01 DIAGNOSIS — G8929 Other chronic pain: Secondary | ICD-10-CM | POA: Diagnosis present

## 2022-04-01 DIAGNOSIS — M533 Sacrococcygeal disorders, not elsewhere classified: Secondary | ICD-10-CM | POA: Diagnosis not present

## 2022-04-01 DIAGNOSIS — M5137 Other intervertebral disc degeneration, lumbosacral region: Secondary | ICD-10-CM | POA: Diagnosis present

## 2022-04-01 DIAGNOSIS — M4186 Other forms of scoliosis, lumbar region: Secondary | ICD-10-CM | POA: Insufficient documentation

## 2022-04-01 DIAGNOSIS — M25552 Pain in left hip: Secondary | ICD-10-CM

## 2022-04-01 DIAGNOSIS — M4316 Spondylolisthesis, lumbar region: Secondary | ICD-10-CM | POA: Diagnosis not present

## 2022-04-01 DIAGNOSIS — R937 Abnormal findings on diagnostic imaging of other parts of musculoskeletal system: Secondary | ICD-10-CM | POA: Insufficient documentation

## 2022-04-01 DIAGNOSIS — M5410 Radiculopathy, site unspecified: Secondary | ICD-10-CM | POA: Diagnosis not present

## 2022-04-01 DIAGNOSIS — M5106 Intervertebral disc disorders with myelopathy, lumbar region: Secondary | ICD-10-CM | POA: Diagnosis present

## 2022-04-01 DIAGNOSIS — M4807 Spinal stenosis, lumbosacral region: Secondary | ICD-10-CM | POA: Diagnosis present

## 2022-04-01 DIAGNOSIS — M5116 Intervertebral disc disorders with radiculopathy, lumbar region: Secondary | ICD-10-CM | POA: Insufficient documentation

## 2022-04-01 NOTE — Telephone Encounter (Signed)
PT will be having an procedure done on tomorrow. PT wants to know if she needs to stop taking Tramadol. Please give patient a call. Thanks

## 2022-04-01 NOTE — Telephone Encounter (Signed)
Called patient back and there was no answer and no way to leave voicemail.

## 2022-04-02 ENCOUNTER — Ambulatory Visit
Admission: RE | Admit: 2022-04-02 | Discharge: 2022-04-02 | Disposition: A | Discharge status: Home / Self Care | Payer: Medicare HMO | Source: Ambulatory Visit | Attending: Pain Medicine | Admitting: Pain Medicine

## 2022-04-02 ENCOUNTER — Ambulatory Visit (HOSPITAL_BASED_OUTPATIENT_CLINIC_OR_DEPARTMENT_OTHER): Payer: Medicare HMO | Admitting: Pain Medicine

## 2022-04-02 ENCOUNTER — Encounter: Payer: Self-pay | Admitting: Pain Medicine

## 2022-04-02 VITALS — BP 151/81 | HR 87 | Temp 97.2°F | Resp 18 | Ht 64.0 in | Wt 168.0 lb

## 2022-04-02 DIAGNOSIS — M5106 Intervertebral disc disorders with myelopathy, lumbar region: Secondary | ICD-10-CM

## 2022-04-02 DIAGNOSIS — M5442 Lumbago with sciatica, left side: Secondary | ICD-10-CM | POA: Diagnosis not present

## 2022-04-02 DIAGNOSIS — M5137 Other intervertebral disc degeneration, lumbosacral region: Secondary | ICD-10-CM

## 2022-04-02 DIAGNOSIS — M5116 Intervertebral disc disorders with radiculopathy, lumbar region: Secondary | ICD-10-CM | POA: Diagnosis not present

## 2022-04-02 DIAGNOSIS — M4316 Spondylolisthesis, lumbar region: Secondary | ICD-10-CM | POA: Diagnosis not present

## 2022-04-02 DIAGNOSIS — M25552 Pain in left hip: Secondary | ICD-10-CM | POA: Diagnosis not present

## 2022-04-02 DIAGNOSIS — R102 Pelvic and perineal pain: Secondary | ICD-10-CM | POA: Diagnosis not present

## 2022-04-02 DIAGNOSIS — M48062 Spinal stenosis, lumbar region with neurogenic claudication: Secondary | ICD-10-CM

## 2022-04-02 DIAGNOSIS — M47816 Spondylosis without myelopathy or radiculopathy, lumbar region: Secondary | ICD-10-CM

## 2022-04-02 DIAGNOSIS — M4807 Spinal stenosis, lumbosacral region: Secondary | ICD-10-CM

## 2022-04-02 DIAGNOSIS — G8929 Other chronic pain: Secondary | ICD-10-CM

## 2022-04-02 DIAGNOSIS — M5126 Other intervertebral disc displacement, lumbar region: Secondary | ICD-10-CM

## 2022-04-02 DIAGNOSIS — M4800 Spinal stenosis, site unspecified: Secondary | ICD-10-CM | POA: Insufficient documentation

## 2022-04-02 DIAGNOSIS — M4186 Other forms of scoliosis, lumbar region: Secondary | ICD-10-CM

## 2022-04-02 DIAGNOSIS — R1032 Left lower quadrant pain: Secondary | ICD-10-CM | POA: Diagnosis not present

## 2022-04-02 DIAGNOSIS — M2540 Effusion, unspecified joint: Secondary | ICD-10-CM

## 2022-04-02 DIAGNOSIS — M51379 Other intervertebral disc degeneration, lumbosacral region without mention of lumbar back pain or lower extremity pain: Secondary | ICD-10-CM

## 2022-04-02 DIAGNOSIS — M5416 Radiculopathy, lumbar region: Secondary | ICD-10-CM | POA: Diagnosis not present

## 2022-04-02 DIAGNOSIS — M5410 Radiculopathy, site unspecified: Secondary | ICD-10-CM

## 2022-04-02 DIAGNOSIS — M4317 Spondylolisthesis, lumbosacral region: Secondary | ICD-10-CM

## 2022-04-02 DIAGNOSIS — R937 Abnormal findings on diagnostic imaging of other parts of musculoskeletal system: Secondary | ICD-10-CM

## 2022-04-02 MED ORDER — ROPIVACAINE HCL 2 MG/ML IJ SOLN
2.0000 mL | Freq: Once | INTRAMUSCULAR | Status: AC
  Administered 2022-04-02: 2 mL via EPIDURAL
  Filled 2022-04-02: qty 20

## 2022-04-02 MED ORDER — SODIUM CHLORIDE 0.9% FLUSH
2.0000 mL | Freq: Once | INTRAVENOUS | Status: AC
  Administered 2022-04-02: 2 mL

## 2022-04-02 MED ORDER — MIDAZOLAM HCL 2 MG/2ML IJ SOLN
0.5000 mg | Freq: Once | INTRAMUSCULAR | Status: AC
  Administered 2022-04-02: 1.5 mg via INTRAVENOUS
  Filled 2022-04-02: qty 2

## 2022-04-02 MED ORDER — IOHEXOL 180 MG/ML  SOLN
10.0000 mL | Freq: Once | INTRAMUSCULAR | Status: AC
  Administered 2022-04-02: 10 mL via EPIDURAL
  Filled 2022-04-02: qty 20

## 2022-04-02 MED ORDER — PENTAFLUOROPROP-TETRAFLUOROETH EX AERO
INHALATION_SPRAY | Freq: Once | CUTANEOUS | Status: AC
  Administered 2022-04-02: 30 via TOPICAL

## 2022-04-02 MED ORDER — LACTATED RINGERS IV SOLN: Freq: Once | INTRAVENOUS | Status: AC

## 2022-04-02 MED ORDER — LIDOCAINE HCL 2 % IJ SOLN
20.0000 mL | Freq: Once | INTRAMUSCULAR | Status: AC
  Administered 2022-04-02: 400 mg
  Filled 2022-04-02: qty 40

## 2022-04-02 MED ORDER — TRIAMCINOLONE ACETONIDE 40 MG/ML IJ SUSP
40.0000 mg | Freq: Once | INTRAMUSCULAR | Status: AC
  Administered 2022-04-02: 40 mg
  Filled 2022-04-02: qty 1

## 2022-04-02 NOTE — Progress Notes (Signed)
PROVIDER NOTE: Interpretation of information contained herein should be left to medically-trained personnel. Specific patient instructions are provided elsewhere under "Patient Instructions" section of medical record. This document was created in part using STT-dictation technology, any transcriptional errors that may result from this process are unintentional.  Patient: Kara [name] Type: Established DOB: [birth year] MRN: [id number] PCP: Jonetta Osgood, NP  Service: Procedure DOS: 04/02/2022 Setting: Ambulatory Location: Ambulatory outpatient facility Delivery: Face-to-face Provider: Gaspar Cola, MD Specialty: Interventional Pain Management Specialty designation: 09 Location: Outpatient facility Ref. Prov.: Milinda Pointer, MD    Primary Reason for Visit: Interventional Pain Management Treatment. CC: Back Pain   Procedure:           Type: Lumbar epidural steroid injection (LESI) (interlaminar) #1    Laterality: Left   Level:  L3-4 Level.  Imaging: Fluoroscopic guidance         Anesthesia: Local anesthesia (1-2% Lidocaine) Anxiolysis: IV Versed 1.5 mg Sedation: No Sedation                       DOS: 04/02/2022  Performed by: Gaspar Cola, MD  Purpose: Diagnostic/Therapeutic Indications: Lumbar radicular pain of intraspinal etiology of more than 4 weeks that has failed to respond to conservative therapy and is severe enough to impact quality of life or function. 1. Left lumbar radiculopathy   2. Herniation of lumbar intervertebral disc with radiculopathy   3. Grade 2 Anterolisthesis of lumbar spine (L4/L5)   4. Anterolisthesis of lumbosacral spine (L5/S1)   5. Chronic low back pain (Left)  w/ sciatica (Left)   6. Hip pain, acute (1ry area of Pain) (Left)   7. Radicular pain of lower extremity (Left)   8. Abnormal MRI, lumbar spine (03/12/2022) ([facility])    NAS-11 Pain score:   Pre-procedure: 10-Worst pain ever/10   Post-procedure: 7 /10       Position / Prep / Materials:  Position: Prone w/ head of the table raised (slight reverse trendelenburg) to facilitate breathing.  Prep solution: DuraPrep (Iodine Povacrylex [0.7% available iodine] and Isopropyl Alcohol, 74% w/w) Prep Area: Entire Posterior Lumbar Region from lower scapular tip down to mid buttocks area and from flank to flank. Materials:  Tray: Epidural tray Needle(s):  Type: Epidural needle (Tuohy) Gauge (G):  17 Length: Regular (3.5-in) Qty: 1  Pre-op H&P Assessment:  Kara [name] is a 76 y.o. (year old), female patient, seen today for interventional treatment. She  has a past surgical history that includes Extracorporeal shock wave lithotripsy (Left, 09/29/2014); Laparoscopic oopherectomy (Left); Tonsillectomy; Cholecystectomy; Cataract extraction; Breast ductal system excision; Ureteroscopy with holmium laser lithotripsy (Left, 04/14/2015); Cystoscopy with stent placement (Left, 04/14/2015); Eye surgery; Tubal ligation; Bilateral salpingectomy; Breast biopsy (Right, 2014); Breast excisional biopsy (Right, 2014); Breast biopsy (Right, 12/21/2018); Breast biopsy (Right, 12/21/2018); Breast biopsy (Right, 01/01/2019); Breast lumpectomy (Right, 01/22/2019); Partial mastectomy with needle localization (Right, 01/22/2019); and Irrigation and debridement abscess (Right, 03/24/2019). Kara [name] has a current medication list which includes the following prescription(s): eylea, aspirin ec, cholecalciferol, doxycycline, trulicity, ferrous gluconate, furosemide, novolog flexpen, novolog flexpen, levemir flexpen, probiotic acidophilus, multivitamin, pantoprazole, pregabalin, rosuvastatin, tamoxifen, and tramadol-acetaminophen, and the following Facility-Administered Medications: lactated ringers. Her primarily concern today is the Back Pain  Initial Vital Signs:  Pulse/HCG Rate: 87ECG Heart Rate: 97 Temp: (!) 97.2 F (36.2 C) Resp: 18 BP: (!) 154/98 SpO2: 95 %  BMI: Estimated body  mass index is 28.84 kg/m as calculated from the following:   Height as of  this encounter: '5\' 4"'$  (1.626 m).   Weight as of this encounter: 168 lb (76.2 kg).  Risk Assessment: Allergies: Reviewed. She is allergic to amoxicillin.  Allergy Precautions: None required Coagulopathies: Reviewed. None identified.  Blood-thinner therapy: None at this time Active Infection(s): Reviewed. None identified. Kara [name] is afebrile  Site Confirmation: Kara [name] was asked to confirm the procedure and laterality before marking the site Procedure checklist: Completed Consent: Before the procedure and under the influence of no sedative(s), amnesic(s), or anxiolytics, the patient was informed of the treatment options, risks and possible complications. To fulfill our ethical and legal obligations, as recommended by the American Medical Association's Code of Ethics, I have informed the patient of my clinical impression; the nature and purpose of the treatment or procedure; the risks, benefits, and possible complications of the intervention; the alternatives, including doing nothing; the risk(s) and benefit(s) of the alternative treatment(s) or procedure(s); and the risk(s) and benefit(s) of doing nothing. The patient was provided information about the general risks and possible complications associated with the procedure. These may include, but are not limited to: failure to achieve desired goals, infection, bleeding, organ or nerve damage, allergic reactions, paralysis, and death. In addition, the patient was informed of those risks and complications associated to Spine-related procedures, such as failure to decrease pain; infection (i.e.: Meningitis, epidural or intraspinal abscess); bleeding (i.e.: epidural hematoma, subarachnoid hemorrhage, or any other type of intraspinal or peri-dural bleeding); organ or nerve damage (i.e.: Any type of peripheral nerve, nerve root, or spinal cord injury) with subsequent damage to  sensory, motor, and/or autonomic systems, resulting in permanent pain, numbness, and/or weakness of one or several areas of the body; allergic reactions; (i.e.: anaphylactic reaction); and/or death. Furthermore, the patient was informed of those risks and complications associated with the medications. These include, but are not limited to: allergic reactions (i.e.: anaphylactic or anaphylactoid reaction(s)); adrenal axis suppression; blood sugar elevation that in diabetics may result in ketoacidosis or comma; water retention that in patients with history of congestive heart failure may result in shortness of breath, pulmonary edema, and decompensation with resultant heart failure; weight gain; swelling or edema; medication-induced neural toxicity; particulate matter embolism and blood vessel occlusion with resultant organ, and/or nervous system infarction; and/or aseptic necrosis of one or more joints. Finally, the patient was informed that Medicine is not an exact science; therefore, there is also the possibility of unforeseen or unpredictable risks and/or possible complications that may result in a catastrophic outcome. The patient indicated having understood very clearly. We have given the patient no guarantees and we have made no promises. Enough time was given to the patient to ask questions, all of which were answered to the patient's satisfaction. Ms. Spickard has indicated that she wanted to continue with the procedure. Attestation: I, the ordering provider, attest that I have discussed with the patient the benefits, risks, side-effects, alternatives, likelihood of achieving goals, and potential problems during recovery for the procedure that I have provided informed consent. Date  Time: 04/02/2022 11:01 AM  Pre-Procedure Preparation:  Monitoring: As per clinic protocol. Respiration, ETCO2, SpO2, BP, heart rate and rhythm monitor placed and checked for adequate function Safety Precautions: Patient  was assessed for positional comfort and pressure points before starting the procedure. Time-out: I initiated and conducted the "Time-out" before starting the procedure, as per protocol. The patient was asked to participate by confirming the accuracy of the "Time Out" information. Verification of the correct person, site, and procedure were performed and  confirmed by me, the nursing staff, and the patient. "Time-out" conducted as per Joint Commission's Universal Protocol (UP.01.01.01). Time: 1127  Description/Narrative of Procedure:          Target: Epidural space via interlaminar opening, initially targeting the lower laminar border of the superior vertebral body. Region: Lumbar Approach: Percutaneous paravertebral  Rationale (medical necessity): procedure needed and proper for the diagnosis and/or treatment of the patient's medical symptoms and needs. Procedural Technique Safety Precautions: Aspiration looking for blood return was conducted prior to all injections. At no point did we inject any substances, as a needle was being advanced. No attempts were made at seeking any paresthesias. Safe injection practices and needle disposal techniques used. Medications properly checked for expiration dates. SDV (single dose vial) medications used. Description of the Procedure: Protocol guidelines were followed. The procedure needle was introduced through the skin, ipsilateral to the reported pain, and advanced to the target area. Bone was contacted and the needle walked caudad, until the lamina was cleared. The epidural space was identified using "loss-of-resistance technique" with 2-3 ml of PF-NaCl (0.9% NSS), in a 5cc LOR glass syringe.  Vitals:   04/02/22 1100 04/02/22 1125 04/02/22 1131 04/02/22 1141  BP: (!) 154/98 105/63 125/72 (!) 151/81  Pulse: 87     Resp: '18 18 16 18  '$ Temp: (!) 97.2 F (36.2 C)     TempSrc: Temporal     SpO2: 95% 96% 99% 98%  Weight: 168 lb (76.2 kg)     Height: '5\' 4"'$  (1.626  m)       Start Time: 1127 hrs. End Time: 1131 hrs.  Imaging Guidance (Spinal):          Type of Imaging Technique: Fluoroscopy Guidance (Spinal) Indication(s): Assistance in needle guidance and placement for procedures requiring needle placement in or near specific anatomical locations not easily accessible without such assistance. Exposure Time: Please see nurses notes. Contrast: Before injecting any contrast, we confirmed that the patient did not have an allergy to iodine, shellfish, or radiological contrast. Once satisfactory needle placement was completed at the desired level, radiological contrast was injected. Contrast injected under live fluoroscopy. No contrast complications. See chart for type and volume of contrast used. Fluoroscopic Guidance: I was personally present during the use of fluoroscopy. "Tunnel Vision Technique" used to obtain the best possible view of the target area. Parallax error corrected before commencing the procedure. "Direction-depth-direction" technique used to introduce the needle under continuous pulsed fluoroscopy. Once target was reached, antero-posterior, oblique, and lateral fluoroscopic projection used confirm needle placement in all planes. Images permanently stored in EMR. Interpretation: I personally interpreted the imaging intraoperatively. Adequate needle placement confirmed in multiple planes. Appropriate spread of contrast into desired area was observed. No evidence of afferent or efferent intravascular uptake. No intrathecal or subarachnoid spread observed. Permanent images saved into the patient's record.  Antibiotic Prophylaxis:   Anti-infectives (From admission, onward)    None      Indication(s): None identified  Post-operative Assessment:  Post-procedure Vital Signs:  Pulse/HCG Rate: 8783 Temp: (!) 97.2 F (36.2 C) Resp: 18 BP: (!) 151/81 SpO2: 98 %  EBL: None  Complications: No immediate post-treatment complications observed by  team, or reported by patient.  Note: The patient tolerated the entire procedure well. A repeat set of vitals were taken after the procedure and the patient was kept under observation following institutional policy, for this type of procedure. Post-procedural neurological assessment was performed, showing return to baseline, prior to discharge. The patient was  provided with post-procedure discharge instructions, including a section on how to identify potential problems. Should any problems arise concerning this procedure, the patient was given instructions to immediately contact us, at any time, without hesitation. In any case, we plan to contact the patient by telephone for a follow-up status report regarding this interventional procedure.  Comments:  No additional relevant information.  Plan of Care  Orders:  Orders Placed This Encounter  Procedures   Lumbar Epidural Injection    Scheduling Instructions:     Procedure: Interlaminar LESI L3-4     Laterality: Left     Sedation: Patient's choice     Timeframe: Today    Order Specific Question:   Where will this procedure be performed?    Answer:   [facility]   Lumbar Epidural Injection    Standing Status:   Future    Standing Expiration Date:   07/02/2022    Scheduling Instructions:     Procedure: Interlaminar Lumbar Epidural Steroid injection (LESI)  TBD     Laterality: Midline     Sedation: Patient's choice.     Timeframe: 2 weeks from now    Order Specific Question:   Where will this procedure be performed?    Answer:   [facility]   DG PAIN CLIN[facility] C-ARM 1-60 MIN NO REPORT    Intraoperative interpretation by procedural physician at [place].    Standing Status:   Standing    Number of Occurrences:   1    Order Specific Question:   Reason for exam:    Answer:   Assistance in needle guidance and placement for procedures requiring needle placement in or near specific anatomical locations not easily  accessible without such assistance.   Informed Consent Details: Physician/Practitioner Attestation; Transcribe to consent form and obtain patient signature    Note: Always confirm laterality of pain with Ms. Blok, before procedure. Transcribe to consent form and obtain patient signature.    Order Specific Question:   Physician/Practitioner attestation of informed consent for procedure/surgical case    Answer:   I, the physician/practitioner, attest that I have discussed with the patient the benefits, risks, side effects, alternatives, likelihood of achieving goals and potential problems during recovery for the procedure that I have provided informed consent.    Order Specific Question:   Procedure    Answer:   Lumbar epidural steroid injection under fluoroscopic guidance    Order Specific Question:   Physician/Practitioner performing the procedure    Answer:   Burnis Kaser A. Dossie Arbour, MD    Order Specific Question:   Indication/Reason    Answer:   Low back and/or lower extremity pain secondary to lumbar radiculitis   Provide equipment / supplies at bedside    Procedural tray: Epidural Tray (Disposable  single use) Skin infiltration needle: Regular 1.5-in, 25-G, (x1) Block needle size: Regular standard Catheter: No catheter required    Standing Status:   Standing    Number of Occurrences:   1    Order Specific Question:   Specify    Answer:   Epidural Tray   Chronic Opioid Analgesic:  Tramadol/APAP 37.5/325 (# 120) 1 tab p.o. 4 times daily MME/day: 30 mg/day   Medications ordered for procedure: Meds ordered this encounter  Medications   iohexol (OMNIPAQUE) 180 MG/ML injection 10 mL    Must be Myelogram-compatible. If not available, you may substitute with a water-soluble, non-ionic, hypoallergenic, myelogram-compatible radiological contrast medium.   lidocaine (XYLOCAINE) 2 % (with  pres) injection 400 mg   pentafluoroprop-tetrafluoroeth (GEBAUERS) aerosol   lactated ringers infusion    midazolam (VERSED) injection 0.5-2 mg    Make sure Flumazenil is available in the pyxis when using this medication. If oversedation occurs, administer 0.2 mg IV over 15 sec. If after 45 sec no response, administer 0.2 mg again over 1 min; may repeat at 1 min intervals; not to exceed 4 doses (1 mg)   sodium chloride flush (NS) 0.9 % injection 2 mL   ropivacaine (PF) 2 mg/mL (0.2%) (NAROPIN) injection 2 mL   triamcinolone acetonide (KENALOG-40) injection 40 mg   Medications administered: We administered iohexol, lidocaine, pentafluoroprop-tetrafluoroeth, lactated ringers, midazolam, sodium chloride flush, ropivacaine (PF) 2 mg/mL (0.2%), and triamcinolone acetonide.  See the medical record for exact dosing, route, and time of administration.  Follow-up plan:   Return in about 2 weeks (around 04/16/2022) for [facility]): (L) (TBD) LESI #2 & PPE.       Interventional Therapies  Risk Factors  Complex Considerations:  IDDM Stage 3 CKD   Planned  Pending:   Diagnostic/therapeutic left L3-4 LESI #1 (04/02/2022)    Under consideration:   Therapeutic (TBD) LESI #2    Completed:   None at this time   Completed by other providers:   None at this time   Therapeutic  Palliative (PRN) options:   None established     Recent Visits Date Type Provider Dept  03/27/22 Office Visit Milinda Pointer, MD [facility]  Showing recent visits within past 90 days and meeting all other requirements Today's Visits Date Type Provider Dept  04/02/22 Procedure visit Milinda Pointer, MD [facility]  Showing today's visits and meeting all other requirements Future Appointments Date Type Provider Dept  04/23/22 Appointment Milinda Pointer, MD [facility]  Showing future appointments within next 90 days and meeting all other requirements  Disposition: Discharge home  Discharge (Date  Time): 04/02/2022; 1144 hrs.   Primary Care Physician: Jonetta Osgood,  NP Location: [facility] Note by: Gaspar Cola, MD Date: 04/02/2022; Time: 12:24 PM  Disclaimer:  Medicine is not an Chief Strategy Officer. The only guarantee in medicine is that nothing is guaranteed. It is important to note that the decision to proceed with this intervention was based on the information collected from the patient. The Data and conclusions were drawn from the patient's questionnaire, the interview, and the physical examination. Because the information was provided in large part by the patient, it cannot be guaranteed that it has not been purposely or unconsciously manipulated. Every effort has been made to obtain as much relevant data as possible for this evaluation. It is important to note that the conclusions that lead to this procedure are derived in large part from the available data. Always take into account that the treatment will also be dependent on availability of resources and existing treatment guidelines, considered by other Pain Management Practitioners as being common knowledge and practice, at the time of the intervention. For Medico-Legal purposes, it is also important to point out that variation in procedural techniques and pharmacological choices are the acceptable norm. The indications, contraindications, technique, and results of the above procedure should only be interpreted and judged by a Board-Certified Interventional Pain Specialist with extensive familiarity and expertise in the same exact procedure and technique.

## 2022-04-02 NOTE — Patient Instructions (Addendum)
____________________________________________________________________________________________  Post-Procedure Discharge Instructions  Instructions: Apply ice:  Purpose: This will minimize any swelling and discomfort after procedure.  When: Day of procedure, as soon as you get home. How: Fill a plastic sandwich bag with crushed ice. Cover it with a small towel and apply to injection site. How long: (15 min on, 15 min off) Apply for 15 minutes then remove x 15 minutes.  Repeat sequence on day of procedure, until you go to bed. Apply heat:  Purpose: To treat any soreness and discomfort from the procedure. When: Starting the next day after the procedure. How: Apply heat to procedure site starting the day following the procedure. How long: May continue to repeat daily, until discomfort goes away. Food intake: Start with clear liquids (like water) and advance to regular food, as tolerated.  Physical activities: Keep activities to a minimum for the first 8 hours after the procedure. After that, then as tolerated. Driving: If you have received any sedation, be responsible and do not drive. You are not allowed to drive for 24 hours after having sedation. Blood thinner: (Applies only to those taking blood thinners) You may restart your blood thinner 6 hours after your procedure. Insulin: (Applies only to Diabetic patients taking insulin) As soon as you can eat, you may resume your normal dosing schedule. Infection prevention: Keep procedure site clean and dry. Shower daily and clean area with soap and water. Post-procedure Pain Diary: Extremely important that this be done correctly and accurately. Recorded information will be used to determine the next step in treatment. For the purpose of accuracy, follow these rules: Evaluate only the area treated. Do not report or include pain from an untreated area. For the purpose of this evaluation, ignore all other areas of pain, except for the treated  area. After your procedure, avoid taking a long nap and attempting to complete the pain diary after you wake up. Instead, set your alarm clock to go off every hour, on the hour, for the initial 8 hours after the procedure. Document the duration of the numbing medicine, and the relief you are getting from it. Do not go to sleep and attempt to complete it later. It will not be accurate. If you received sedation, it is likely that you were given a medication that may cause amnesia. Because of this, completing the diary at a later time may cause the information to be inaccurate. This information is needed to plan your care. Follow-up appointment: Keep your post-procedure follow-up evaluation appointment after the procedure (usually 2 weeks for most procedures, 6 weeks for radiofrequencies). DO NOT FORGET to bring you pain diary with you.   Expect: (What should I expect to see with my procedure?) From numbing medicine (AKA: Local Anesthetics): Numbness or decrease in pain. You may also experience some weakness, which if present, could last for the duration of the local anesthetic. Onset: Full effect within 15 minutes of injected. Duration: It will depend on the type of local anesthetic used. On the average, 1 to 8 hours.  From steroids (Applies only if steroids were used): Decrease in swelling or inflammation. Once inflammation is improved, relief of the pain will follow. Onset of benefits: Depends on the amount of swelling present. The more swelling, the longer it will take for the benefits to be seen. In some cases, up to 10 days. Duration: Steroids will stay in the system x 2 weeks. Duration of benefits will depend on multiple posibilities including persistent irritating factors. Side-effects: If present, they   may typically last 2 weeks (the duration of the steroids). Frequent: Cramps (if they occur, drink Gatorade and take over-the-counter Magnesium 450-500 mg once to twice a day); water retention with  temporary weight gain; increases in blood sugar; decreased immune system response; increased appetite. Occasional: Facial flushing (red, warm cheeks); mood swings; menstrual changes. Uncommon: Long-term decrease or suppression of natural hormones; bone thinning. (These are more common with higher doses or more frequent use. This is why we prefer that our patients avoid having any injection therapies in other practices.)  Very Rare: Severe mood changes; psychosis; aseptic necrosis. From procedure: Some discomfort is to be expected once the numbing medicine wears off. This should be minimal if ice and heat are applied as instructed.  Call if: (When should I call?) You experience numbness and weakness that gets worse with time, as opposed to wearing off. New onset bowel or bladder incontinence. (Applies only to procedures done in the spine)  Emergency Numbers: Durning business hours (Monday - Thursday, 8:00 AM - 4:00 PM) (Friday, 9:00 AM - 12:00 Noon): [phone] After hours: [phone] NOTE: If you are having a problem and are unable connect with, or to talk to a provider, then go to your nearest urgent care or emergency department. If the problem is serious and urgent, please call 911. ____________________________________________________________________________________________    ______________________________________________________________________  Preparing for your procedure  During your procedure appointment there will be: No Prescription Refills. No disability issues to discussed. No medication changes or discussions.  Instructions: Food intake: Avoid eating anything solid for at least 8 hours prior to your procedure. Clear liquid intake: You may take clear liquids such as water up to 2 hours prior to your procedure. (No carbonated drinks. No soda.) Transportation: Unless otherwise stated by your physician, bring a driver. Morning Medicines: Except for blood thinners, take  all of your other morning medications with a sip of water. Make sure to take your heart and blood pressure medicines. If your blood pressure's lower number is above 100, the case will be rescheduled. Blood thinners: If you take a blood thinner, but were not instructed to stop it, call our office [phone] and ask to talk to a nurse. Not stopping a blood thinner prior to certain procedures could lead to serious complications. Diabetics on insulin: Notify the staff so that you can be scheduled 1st case in the morning. If your diabetes requires high dose insulin, take only  of your normal insulin dose the morning of the procedure and notify the staff that you have done so. Preventing infections: Shower with an antibacterial soap the morning of your procedure.  Build-up your immune system: Take 1000 mg of Vitamin C with every meal (3 times a day) the day prior to your procedure. Antibiotics: Inform the nursing staff if you are taking any antibiotics or if you have any conditions that may require antibiotics prior to procedures. (Example: recent joint implants)   Pregnancy: If you are pregnant make sure to notify the nursing staff. Not doing so may result in injury to the fetus, including death.  Sickness: If you have a cold, fever, or any active infections, call and cancel or reschedule your procedure. Receiving steroids while having an infection may result in complications. Arrival: You must be in the facility at least 30 minutes prior to your scheduled procedure. Tardiness: Your scheduled time is also the cutoff time. If you do not arrive at least 15 minutes prior to your procedure, you will be rescheduled.  Children: Do not bring any children with you. Make arrangements to keep them home. Dress appropriately: There is always a possibility that your clothing may get soiled. Avoid long dresses. Valuables: Do not bring any jewelry or valuables.  Reasons to call and reschedule or cancel your  procedure: (Following these recommendations will minimize the risk of a serious complication.) Surgeries: Avoid having procedures within 2 weeks of any surgery. (Avoid for 2 weeks before or after any surgery). Flu Shots: Avoid having procedures within 2 weeks of a flu shots or . (Avoid for 2 weeks before or after immunizations). Barium: Avoid having a procedure within 7-10 days after having had a radiological study involving the use of radiological contrast. (Myelograms, Barium swallow or enema study). Heart attacks: Avoid any elective procedures or surgeries for the initial 6 months after a "Myocardial Infarction" (Heart Attack). Blood thinners: It is imperative that you stop these medications before procedures. Let us know if you if you take any blood thinner.  Infection: Avoid procedures during or within two weeks of an infection (including chest colds or gastrointestinal problems). Symptoms associated with infections include: Localized redness, fever, chills, night sweats or profuse sweating, burning sensation when voiding, cough, congestion, stuffiness, runny nose, sore throat, diarrhea, nausea, vomiting, cold or Flu symptoms, recent or current infections. It is specially important if the infection is over the area that we intend to treat. Heart and lung problems: Symptoms that may suggest an active cardiopulmonary problem include: cough, chest pain, breathing difficulties or shortness of breath, dizziness, ankle swelling, uncontrolled high or unusually low blood pressure, and/or palpitations. If you are experiencing any of these symptoms, cancel your procedure and contact your primary care physician for an evaluation.  Remember:  Regular Business hours are:  Monday to Thursday 8:00 AM to 4:00 PM  Provider's Schedule: Milinda Pointer, MD:  Procedure days: Tuesday and Thursday 7:30 AM to 4:00 PM  Gillis Santa, MD:  Procedure days: Monday and Wednesday 7:30 AM to 4:00  PM  ______________________________________________________________________    ____________________________________________________________________________________________  General Risks and Possible Complications  Patient Responsibilities: It is important that you read this as it is part of your informed consent. It is our duty to inform you of the risks and possible complications associated with treatments offered to you. It is your responsibility as a patient to read this and to ask questions about anything that is not clear or that you believe was not covered in this document.  Patient's Rights: You have the right to refuse treatment. You also have the right to change your mind, even after initially having agreed to have the treatment done. However, under this last option, if you wait until the last second to change your mind, you may be charged for the materials used up to that point.  Introduction: Medicine is not an Chief Strategy Officer. Everything in Medicine, including the lack of treatment(s), carries the potential for danger, harm, or loss (which is by definition: Risk). In Medicine, a complication is a secondary problem, condition, or disease that can aggravate an already existing one. All treatments carry the risk of possible complications. The fact that a side effects or complications occurs, does not imply that the treatment was conducted incorrectly. It must be clearly understood that these can happen even when everything is done following the highest safety standards.  No treatment: You can choose not to proceed with the proposed treatment alternative. The "PRO(s)" would include: avoiding the risk of complications associated with the therapy. The "CON(s)" would  include: not getting any of the treatment benefits. These benefits fall under one of three categories: diagnostic; therapeutic; and/or palliative. Diagnostic benefits include: getting information which can ultimately lead to  improvement of the disease or symptom(s). Therapeutic benefits are those associated with the successful treatment of the disease. Finally, palliative benefits are those related to the decrease of the primary symptoms, without necessarily curing the condition (example: decreasing the pain from a flare-up of a chronic condition, such as incurable terminal cancer).  General Risks and Complications: These are associated to most interventional treatments. They can occur alone, or in combination. They fall under one of the following six (6) categories: no benefit or worsening of symptoms; bleeding; infection; nerve damage; allergic reactions; and/or death. No benefits or worsening of symptoms: In Medicine there are no guarantees, only probabilities. No healthcare provider can ever guarantee that a medical treatment will work, they can only state the probability that it may. Furthermore, there is always the possibility that the condition may worsen, either directly, or indirectly, as a consequence of the treatment. Bleeding: This is more common if the patient is taking a blood thinner, either prescription or over the counter (example: Goody Powders, Fish oil, Aspirin, Garlic, etc.), or if suffering a condition associated with impaired coagulation (example: Hemophilia, cirrhosis of the liver, low platelet counts, etc.). However, even if you do not have one on these, it can still happen. If you have any of these conditions, or take one of these drugs, make sure to notify your treating physician. Infection: This is more common in patients with a compromised immune system, either due to disease (example: diabetes, cancer, human immunodeficiency virus [HIV], etc.), or due to medications or treatments (example: therapies used to treat cancer and rheumatological diseases). However, even if you do not have one on these, it can still happen. If you have any of these conditions, or take one of these drugs, make sure to notify  your treating physician. Nerve Damage: This is more common when the treatment is an invasive one, but it can also happen with the use of medications, such as those used in the treatment of cancer. The damage can occur to small secondary nerves, or to large primary ones, such as those in the spinal cord and brain. This damage may be temporary or permanent and it may lead to impairments that can range from temporary numbness to permanent paralysis and/or brain death. Allergic Reactions: Any time a substance or material comes in contact with our body, there is the possibility of an allergic reaction. These can range from a mild skin rash (contact dermatitis) to a severe systemic reaction (anaphylactic reaction), which can result in death. Death: In general, any medical intervention can result in death, most of the time due to an unforeseen complication. ____________________________________________________________________________________________

## 2022-04-03 ENCOUNTER — Telehealth: Payer: Self-pay | Admitting: *Deleted

## 2022-04-03 NOTE — Telephone Encounter (Signed)
Called for post procedure check. Denies any post procedure issues.

## 2022-04-07 ENCOUNTER — Other Ambulatory Visit: Payer: Self-pay | Admitting: Nurse Practitioner

## 2022-04-07 DIAGNOSIS — E1142 Type 2 diabetes mellitus with diabetic polyneuropathy: Secondary | ICD-10-CM

## 2022-04-11 ENCOUNTER — Other Ambulatory Visit: Payer: Self-pay | Admitting: Nurse Practitioner

## 2022-04-11 DIAGNOSIS — E1142 Type 2 diabetes mellitus with diabetic polyneuropathy: Secondary | ICD-10-CM

## 2022-04-16 ENCOUNTER — Other Ambulatory Visit: Payer: Self-pay | Admitting: Nurse Practitioner

## 2022-04-16 DIAGNOSIS — E1142 Type 2 diabetes mellitus with diabetic polyneuropathy: Secondary | ICD-10-CM

## 2022-04-16 NOTE — Telephone Encounter (Signed)
Please send

## 2022-04-22 NOTE — Progress Notes (Unsigned)
PROVIDER NOTE: Information contained herein reflects review and annotations entered in association with encounter. Interpretation of such information and data should be left to medically-trained personnel. Information provided to patient can be located elsewhere in the medical record under "Patient Instructions". Document created using STT-dictation technology, any transcriptional errors that may result from process are unintentional.    Patient: Kara [name]  Service Category: E/M  Provider: Gaspar Cola, MD  DOB: Jul 08, 1945  DOS: 04/23/2022  Referring Provider: Jonetta Osgood, NP  MRN: [id number]  Specialty: Interventional Pain Management  PCP: Kara Osgood, NP  Type: Established Patient  Setting: Ambulatory outpatient    Location: Office  Delivery: Face-to-face     HPI  [name], a 77 y.o. year old female, is here today because of her Left lumbar radiculopathy [M54.16]. [name] primary complain today is No chief complaint on file. Last encounter: My last encounter with her was on 04/02/2022. Pertinent problems: [name] has Inflammatory polyarthropathy ([place]); Diabetic polyneuropathy associated with type 2 diabetes mellitus ([place]); Ductal carcinoma in situ (DCIS) of right breast; Lower extremity edema (Bilateral); Synovial cyst of right popliteal space; Abnormal MRI, lumbar spine (03/12/2022) ([facility]); Hip pain, acute (1ry area of Pain) (Left); Radicular pain of lower extremity (Left); Groin pain (Left); Anterolisthesis (Grade 2) of lumbar spine (L4/L5); Anterolisthesis of lumbosacral spine (L5/S1); Chronic [name] back pain (Left)  w/ sciatica (Left); Lumbar radiculopathy (Left); Herniation of lumbar intervertebral disc w/ radiculopathy; Pain in joint, pelvic region and thigh; Pelvic and perineal pain; DDD (degenerative disc disease), lumbosacral; Rotatory Levoscoliosis of lumbar spine (L3-L5); IVDD w/ compression of cauda equina (HCC) (L3-4, L4-5); Lumbar central  spinal stenosis w/ neurogenic claudication (L2-L5); Lumbosacral foraminal stenosis; Lumbar facet arthropathy (Multilevel) (Bilateral); Arthropathy of spinal facet joint concurrent with and due to effusion; and Compression of lumbar nerve root (L3-4, L4-5) on their pertinent problem list. Pain Assessment: Severity of   is reported as a  /10. Location:    / . Onset:  . Quality:  . Timing:  . Modifying factor(s):  Marland Kitchen Vitals:  vitals were not taken for this visit.  BMI: Estimated body mass index is 28.84 kg/m as calculated from the following:   Height as of 04/02/22: _0  (1.626 m).   Weight as of 04/02/22: 168 lb (76.2 kg).  Reason for encounter: post-procedure evaluation and assessment. ***  Post-procedure evaluation   Type: Lumbar epidural steroid injection (LESI) (interlaminar) #1    Laterality: Left   Level:  L3-4 Level.  Imaging: Fluoroscopic guidance         Anesthesia: Local anesthesia (1-2% Lidocaine) Anxiolysis: IV Versed 1.5 mg Sedation: No Sedation                       DOS: 04/02/2022  Performed by: Kara Cola, MD  Purpose: Diagnostic/Therapeutic Indications: Lumbar radicular pain of intraspinal etiology of more than 4 weeks that has failed to respond to conservative therapy and is severe enough to impact quality of life or function. 1. Left lumbar radiculopathy   2. Herniation of lumbar intervertebral disc with radiculopathy   3. Grade 2 Anterolisthesis of lumbar spine (L4/L5)   4. Anterolisthesis of lumbosacral spine (L5/S1)   5. Chronic [name] back pain (Left)  w/ sciatica (Left)   6. Hip pain, acute (1ry area of Pain) (Left)   7. Radicular pain of lower extremity (Left)   8. Abnormal MRI, lumbar spine (03/12/2022) ([facility])    NAS-11 Pain score:  Pre-procedure: 10-Worst pain ever/10   Post-procedure: 7 /10      Effectiveness:  Initial hour after procedure:   ***. Subsequent 4-6 hours post-procedure:   ***. Analgesia past initial 6 hours:   ***. Ongoing  improvement:  Analgesic:  *** Function:    ***    ROM:    ***     Pharmacotherapy Assessment  Analgesic: No chronic opioid analgesics therapy prescribed by our practice. Tramadol/APAP 37.5/325 (# 120) 1 tab p.o. 4 times daily MME/day: 30 mg/day   Monitoring: [place] PMP: PDMP reviewed during this encounter.       Pharmacotherapy: No side-effects or adverse reactions reported. Compliance: No problems identified. Effectiveness: Clinically acceptable.  No notes on file  No results found for: "CBDTHCR" No results found for: "D8THCCBX" No results found for: "D9THCCBX"  UDS:  No results found for: "SUMMARY"    ROS  Constitutional: Denies any fever or chills Gastrointestinal: No reported hemesis, hematochezia, vomiting, or acute GI distress Musculoskeletal: Denies any acute onset joint swelling, redness, loss of ROM, or weakness Neurological: No reported episodes of acute onset apraxia, aphasia, dysarthria, agnosia, amnesia, paralysis, loss of coordination, or loss of consciousness  Medication Review  Aflibercept, Dulaglutide, Ferrous Gluconate, Probiotic Acidophilus, aspirin EC, cholecalciferol, doxycycline, furosemide, insulin aspart, insulin detemir, multivitamin, pantoprazole, pregabalin, rosuvastatin, tamoxifen, and traMADol-acetaminophen  History Review  Allergy: [name] is allergic to amoxicillin. Drug: [name]  reports no history of drug use. Alcohol:  reports no history of alcohol use. Tobacco:  reports that she quit smoking about 27 years ago. Her smoking use included cigarettes. She has a 30.00 pack-year smoking history. She has never used smokeless tobacco. Social: [name]  reports that she quit smoking about 27 years ago. Her smoking use included cigarettes. She has a 30.00 pack-year smoking history. She has never used smokeless tobacco. She reports that she does not drink alcohol and does not use drugs. Medical:  has a past medical history of Anemia,  Arthropathy, Benign neoplasm of breast, Breast cancer ([place]), Chest pain, Chronic kidney disease, Diabetes ([place]), Diabetic retinopathy ([place]), Dyspnea, Edema, History of kidney stones, Hyperlipemia, Hypersomnia with sleep apnea, Hypertension, 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[place]), Lumbago, Osteoarthrosis, Ovarian failure, Personal history of radiation therapy, Pneumonia (12/21/2021), and SOB (shortness of breath). Surgical: Ms. Hoyos  has a past surgical history that includes Extracorporeal shock wave lithotripsy (Left, 09/29/2014); Laparoscopic oopherectomy (Left); Tonsillectomy; Cholecystectomy; Cataract extraction; Breast ductal system excision; Ureteroscopy with holmium laser lithotripsy (Left, 04/14/2015); Cystoscopy with stent placement (Left, 04/14/2015); Eye surgery; Tubal ligation; Bilateral salpingectomy; Breast biopsy (Right, 2014); Breast excisional biopsy (Right, 2014); Breast biopsy (Right, 12/21/2018); Breast biopsy (Right, 12/21/2018); Breast biopsy (Right, 01/01/2019); Breast lumpectomy (Right, 01/22/2019); Partial mastectomy with needle localization (Right, 01/22/2019); and Irrigation and debridement abscess (Right, 03/24/2019). Family: family history includes Breast cancer (age of onset: 34) in her maternal grandmother; Colon cancer in her mother; Lung cancer in her father.  Laboratory Chemistry Profile   Renal Lab Results  Component Value Date   BUN 32 (H) 12/27/2021   CREATININE 1.53 (H) 12/27/2021   BCR 20 11/06/2021   GFRAA 39 (L) 08/16/2019   GFRNONAA 35 (L) 12/27/2021    Hepatic Lab Results  Component Value Date   AST 34 12/27/2021   ALT 37 12/27/2021   ALBUMIN 2.6 (L) 12/27/2021   ALKPHOS 59 12/27/2021    Electrolytes Lab Results  Component Value Date   NA 143 12/27/2021   K 3.4 (L) 12/27/2021   CL 106 12/27/2021  CALCIUM 8.8 (L) 12/27/2021   MG 2.0 12/27/2021   PHOS 3.3 12/27/2021    Bone Lab Results  Component Value Date   VD25OH 41.7 11/06/2021     Inflammation (CRP: Acute Phase) (ESR: Chronic Phase) Lab Results  Component Value Date   CRP 0.8 12/27/2021   LATICACIDVEN 0.9 12/23/2021         Note: Above Lab results reviewed.  Recent Imaging Review  DG PAIN CLINIC C-ARM 1-60 MIN NO REPORT Fluoro was used, but no Radiologist interpretation will be provided.  Please refer to "NOTES" tab for provider progress note. Note: Reviewed        Physical Exam  General appearance: Well nourished, well developed, and well hydrated. In no apparent acute distress Mental status: Alert, oriented x 3 (person, place, & time)       Respiratory: No evidence of acute respiratory distress Eyes: PERLA Vitals: There were no vitals taken for this visit. BMI: Estimated body mass index is 28.84 kg/m as calculated from the following:   Height as of 04/02/22: _0  (1.626 m).   Weight as of 04/02/22: 168 lb (76.2 kg). Ideal: Patient weight not recorded  Assessment   Diagnosis Status  1. Left lumbar radiculopathy   2. Radicular pain of lower extremity (Left)   3. Hip pain, acute, left   4. Chronic [name] back pain (Left)  w/ sciatica (Left)    Controlled Controlled Controlled   Updated Problems: No problems updated.  Plan of Care  Problem-specific:  No problem-specific Assessment & Plan notes found for this encounter.  [name] has a current medication list which includes the following long-term medication(s): ferrous gluconate, furosemide, novolog flexpen, novolog flexpen, levemir flexpen, pantoprazole, pregabalin, and rosuvastatin.  Pharmacotherapy (Medications Ordered): No orders of the defined types were placed in this encounter.  Orders:  No orders of the defined types were placed in this encounter.  Follow-up plan:   No follow-ups on file.     Interventional Therapies  Risk Factors  Considerations:  IDDM-Type II Stage 3 CKD   Planned  Pending:   Therapeutic left L3-4 LESI #2     Under consideration:    Therapeutic (TBD) LESI #2    Completed:   Diagnostic/therapeutic left L3-4 LESI x1 (04/02/2022)    Completed by other providers:   None at this time   Therapeutic  Palliative (PRN) options:   None established      Recent Visits Date Type Provider Dept  04/02/22 Procedure visit Milinda Pointer, MD [facility]  03/27/22 Office Visit Milinda Pointer, MD [facility]  Showing recent visits within past 90 days and meeting all other requirements Future Appointments Date Type Provider Dept  04/23/22 Appointment Milinda Pointer, MD [facility]  Showing future appointments within next 90 days and meeting all other requirements  I discussed the assessment and treatment plan with the patient. The patient was provided an opportunity to ask questions and all were answered. The patient agreed with the plan and demonstrated an understanding of the instructions.  Patient advised to call back or seek an in-person evaluation if the symptoms or condition worsens.  Duration of encounter: *** minutes.  Total time on encounter, as per AMA guidelines included both the face-to-face and non-face-to-face time personally spent by the physician and/or other qualified health care professional(s) on the day of the encounter (includes time in activities that require the physician or other qualified health care professional and does not include time in activities  normally performed by clinical staff). Physician's time may include the following activities when performed: Preparing to see the patient (e.g., pre-charting review of records, searching for previously ordered imaging, lab work, and nerve conduction tests) Review of prior analgesic pharmacotherapies. Reviewing PMP Interpreting ordered tests (e.g., lab work, imaging, nerve conduction tests) Performing post-procedure evaluations, including interpretation of diagnostic procedures Obtaining and/or reviewing separately  obtained history Performing a medically appropriate examination and/or evaluation Counseling and educating the patient/family/caregiver Ordering medications, tests, or procedures Referring and communicating with other health care professionals (when not separately reported) Documenting clinical information in the electronic or other health record Independently interpreting results (not separately reported) and communicating results to the patient/ family/caregiver Care coordination (not separately reported)  Note by: Kara Cola, MD Date: 04/23/2022; Time: 10:44 AM

## 2022-04-23 ENCOUNTER — Ambulatory Visit
Admission: RE | Admit: 2022-04-23 | Discharge: 2022-04-23 | Disposition: A | Discharge status: Home / Self Care | Payer: Medicare HMO | Source: Ambulatory Visit | Attending: Pain Medicine | Admitting: Pain Medicine

## 2022-04-23 ENCOUNTER — Encounter: Payer: Self-pay | Admitting: Pain Medicine

## 2022-04-23 ENCOUNTER — Ambulatory Visit: Payer: Medicare HMO | Attending: Pain Medicine | Admitting: Pain Medicine

## 2022-04-23 VITALS — BP 175/66 | HR 76 | Temp 97.1°F | Resp 16 | Ht 64.0 in | Wt 169.0 lb

## 2022-04-23 DIAGNOSIS — R1032 Left lower quadrant pain: Secondary | ICD-10-CM | POA: Diagnosis not present

## 2022-04-23 DIAGNOSIS — M1612 Unilateral primary osteoarthritis, left hip: Secondary | ICD-10-CM | POA: Diagnosis not present

## 2022-04-23 DIAGNOSIS — M25652 Stiffness of left hip, not elsewhere classified: Secondary | ICD-10-CM | POA: Diagnosis not present

## 2022-04-23 DIAGNOSIS — M5410 Radiculopathy, site unspecified: Secondary | ICD-10-CM

## 2022-04-23 DIAGNOSIS — M25552 Pain in left hip: Secondary | ICD-10-CM

## 2022-04-23 DIAGNOSIS — M5416 Radiculopathy, lumbar region: Secondary | ICD-10-CM

## 2022-04-23 DIAGNOSIS — G8929 Other chronic pain: Secondary | ICD-10-CM | POA: Insufficient documentation

## 2022-04-23 DIAGNOSIS — M5442 Lumbago with sciatica, left side: Secondary | ICD-10-CM | POA: Diagnosis not present

## 2022-04-23 MED ORDER — METHYLPREDNISOLONE ACETATE 80 MG/ML IJ SUSP
INTRAMUSCULAR | Status: AC
  Filled 2022-04-23: qty 1

## 2022-04-23 MED ORDER — IOHEXOL 180 MG/ML  SOLN
10.0000 mL | Freq: Once | INTRAMUSCULAR | Status: AC
  Administered 2022-04-23: 10 mL via INTRA_ARTICULAR

## 2022-04-23 MED ORDER — PENTAFLUOROPROP-TETRAFLUOROETH EX AERO
INHALATION_SPRAY | Freq: Once | CUTANEOUS | Status: AC
  Administered 2022-04-23: 60 via TOPICAL
  Filled 2022-04-23: qty 116

## 2022-04-23 MED ORDER — LIDOCAINE HCL 2 % IJ SOLN
20.0000 mL | Freq: Once | INTRAMUSCULAR | Status: AC
  Administered 2022-04-23: 100 mg

## 2022-04-23 MED ORDER — LIDOCAINE HCL (PF) 2 % IJ SOLN
INTRAMUSCULAR | Status: AC
  Filled 2022-04-23: qty 5

## 2022-04-23 MED ORDER — ROPIVACAINE HCL 2 MG/ML IJ SOLN
INTRAMUSCULAR | Status: AC
  Filled 2022-04-23: qty 20

## 2022-04-23 MED ORDER — METHYLPREDNISOLONE ACETATE 80 MG/ML IJ SUSP
80.0000 mg | Freq: Once | INTRAMUSCULAR | Status: AC
  Administered 2022-04-23: 80 mg via INTRA_ARTICULAR

## 2022-04-23 MED ORDER — IOHEXOL 180 MG/ML  SOLN
INTRAMUSCULAR | Status: AC
  Filled 2022-04-23: qty 20

## 2022-04-23 MED ORDER — ROPIVACAINE HCL 2 MG/ML IJ SOLN
9.0000 mL | Freq: Once | INTRAMUSCULAR | Status: AC
  Administered 2022-04-23: 9 mL via INTRA_ARTICULAR

## 2022-04-23 NOTE — Progress Notes (Signed)
Safety precautions to be maintained throughout the outpatient stay will include: orient to surroundings, keep bed in low position, maintain call bell within reach at all times, provide assistance with transfer out of bed and ambulation.  

## 2022-04-23 NOTE — Patient Instructions (Signed)

## 2022-04-23 NOTE — Progress Notes (Signed)
PROVIDER NOTE: Interpretation of information contained herein should be left to medically-trained personnel. Specific patient instructions are provided elsewhere under "Patient Instructions" section of medical record. This document was created in part using STT-dictation technology, any transcriptional errors that may result from this process are unintentional.  Patient: Kara [name] Type: Established DOB: 12/12/1945 MRN: [id number] PCP: Jonetta Osgood, NP  Service: Procedure DOS: 04/23/2022 Setting: Ambulatory Location: Ambulatory outpatient facility Delivery: Face-to-face Provider: Gaspar Cola, MD Specialty: Interventional Pain Management Specialty designation: 09 Location: Outpatient facility Ref. Prov.: Jonetta Osgood, NP    Primary Reason for Visit: Interventional Pain Management Treatment. CC: Hip Pain (left)  Procedure:               Type:  Intra-articular hip & peri-articular bursae injection #1  Laterality: Left (-LT)  Level: Lower pelvic and hip joint level.  Imaging: Fluoroscopy-guided         Anesthesia: Local anesthesia (1-2% Lidocaine) Anxiolysis: None                 Sedation: No Sedation                       DOS: 04/23/2022  Performed by: Gaspar Cola, MD  Purpose: Diagnostic/Therapeutic Indications: Hip pain severe enough to impact quality of life or function. Rationale (medical necessity): procedure needed and proper for the diagnosis and/or treatment of Kara [name]'s medical symptoms and needs. 1. Hip pain, acute, left   2. Chronic hip pain (Left)   3. Primary osteoarthritis of hip (Left)   4. Groin pain (Left)   5. Impaired range of motion of hip (Left)    NAS-11 Pain score:   Pre-procedure: 7 /10   Post-procedure: 4 /10      Intra-articular Target: Superior aspect of the hip joint cavity, going thru the superior portion of the capsular ligament.  Bursae Target: Gluteus Medius Bursa Location: Intra-articular &  peri-articular Region: Hip joint, upper (proximal) femoral region Approach: Percutaneous posterolateral approach. Type of procedure: Percutaneous joint injection   Position / Prep / Materials:  Position: Lateral Decubitus with affected side up  Prep solution: DuraPrep (Iodine Povacrylex [0.7% available iodine] and Isopropyl Alcohol, 74% w/w) Prep Area:  Entire Posterolateral hip area. Materials:  Tray: Block tray Needle(s):  Type: Spinal  Gauge (G): 22  Length: 7-in  Qty: 1  Pre-op H&P Assessment:  Kara [name] is a 77 y.o. (year old), female patient, seen today for interventional treatment. She  has a past surgical history that includes Extracorporeal shock wave lithotripsy (Left, 09/29/2014); Laparoscopic oopherectomy (Left); Tonsillectomy; Cholecystectomy; Cataract extraction; Breast ductal system excision; Ureteroscopy with holmium laser lithotripsy (Left, 04/14/2015); Cystoscopy with stent placement (Left, 04/14/2015); Eye surgery; Tubal ligation; Bilateral salpingectomy; Breast biopsy (Right, 2014); Breast excisional biopsy (Right, 2014); Breast biopsy (Right, 12/21/2018); Breast biopsy (Right, 12/21/2018); Breast biopsy (Right, 01/01/2019); Breast lumpectomy (Right, 01/22/2019); Partial mastectomy with needle localization (Right, 01/22/2019); and Irrigation and debridement abscess (Right, 03/24/2019). Kara [name] has a current medication list which includes the following prescription(s): eylea, aspirin ec, cholecalciferol, doxycycline, trulicity, ferrous gluconate, furosemide, novolog flexpen, novolog flexpen, levemir flexpen, probiotic acidophilus, multivitamin, pantoprazole, pregabalin, rosuvastatin, tamoxifen, and tramadol-acetaminophen. Her primarily concern today is the Hip Pain (left)  Initial Vital Signs:  Pulse/HCG Rate: 76ECG Heart Rate: 84 Temp: (!) 97.1 F (36.2 C) Resp: 18 BP: (!) 176/69 SpO2: 99 %  BMI: Estimated body mass index is 29.01 kg/m as calculated from the  following:   Height as of  this encounter: '5\' 4"'$  (1.626 m).   Weight as of this encounter: 169 lb (76.7 kg).  Risk Assessment: Allergies: Reviewed. She is allergic to amoxicillin.  Allergy Precautions: None required Coagulopathies: Reviewed. None identified.  Blood-thinner therapy: None at this time Active Infection(s): Reviewed. None identified. Kara [name] is afebrile  Site Confirmation: Kara [name] was asked to confirm the procedure and laterality before marking the site Procedure checklist: Completed Consent: Before the procedure and under the influence of no sedative(s), amnesic(s), or anxiolytics, the patient was informed of the treatment options, risks and possible complications. To fulfill our ethical and legal obligations, as recommended by the American Medical Association's Code of Ethics, I have informed the patient of my clinical impression; the nature and purpose of the treatment or procedure; the risks, benefits, and possible complications of the intervention; the alternatives, including doing nothing; the risk(s) and benefit(s) of the alternative treatment(s) or procedure(s); and the risk(s) and benefit(s) of doing nothing. The patient was provided information about the general risks and possible complications associated with the procedure. These may include, but are not limited to: failure to achieve desired goals, infection, bleeding, organ or nerve damage, allergic reactions, paralysis, and death. In addition, the patient was informed of those risks and complications associated to the procedure, such as failure to decrease pain; infection; bleeding; organ or nerve damage with subsequent damage to sensory, motor, and/or autonomic systems, resulting in permanent pain, numbness, and/or weakness of one or several areas of the body; allergic reactions; (i.e.: anaphylactic reaction); and/or death. Furthermore, the patient was informed of those risks and complications associated with the  medications. These include, but are not limited to: allergic reactions (i.e.: anaphylactic or anaphylactoid reaction(s)); adrenal axis suppression; blood sugar elevation that in diabetics may result in ketoacidosis or comma; water retention that in patients with history of congestive heart failure may result in shortness of breath, pulmonary edema, and decompensation with resultant heart failure; weight gain; swelling or edema; medication-induced neural toxicity; particulate matter embolism and blood vessel occlusion with resultant organ, and/or nervous system infarction; and/or aseptic necrosis of one or more joints. Finally, the patient was informed that Medicine is not an exact science; therefore, there is also the possibility of unforeseen or unpredictable risks and/or possible complications that may result in a catastrophic outcome. The patient indicated having understood very clearly. We have given the patient no guarantees and we have made no promises. Enough time was given to the patient to ask questions, all of which were answered to the patient's satisfaction. Ms. Dewing has indicated that she wanted to continue with the procedure. Attestation: I, the ordering provider, attest that I have discussed with the patient the benefits, risks, side-effects, alternatives, likelihood of achieving goals, and potential problems during recovery for the procedure that I have provided informed consent. Date  Time: 04/23/2022 11:08 AM  Pre-Procedure Preparation:  Monitoring: As per clinic protocol. Respiration, ETCO2, SpO2, BP, heart rate and rhythm monitor placed and checked for adequate function Safety Precautions: Patient was assessed for positional comfort and pressure points before starting the procedure. Time-out: I initiated and conducted the "Time-out" before starting the procedure, as per protocol. The patient was asked to participate by confirming the accuracy of the "Time Out" information. Verification  of the correct person, site, and procedure were performed and confirmed by me, the nursing staff, and the patient. "Time-out" conducted as per Joint Commission's Universal Protocol (UP.01.01.01). Time: 5643  Description/Narrative of Procedure:  Rationale (medical necessity): procedure needed and proper for the diagnosis and/or treatment of the patient's medical symptoms and needs. Procedural Technique Safety Precautions: Aspiration looking for blood return was conducted prior to all injections. At no point did we inject any substances, as a needle was being advanced. No attempts were made at seeking any paresthesias. Safe injection practices and needle disposal techniques used. Medications properly checked for expiration dates. SDV (single dose vial) medications used. Description of the Procedure: Protocol guidelines were followed. The patient was assisted into a comfortable position. The target area was identified and the area prepped in the usual manner. Skin & deeper tissues infiltrated with local anesthetic. Appropriate amount of time allowed to pass for local anesthetics to take effect. The procedure needles were then advanced to the target area. Proper needle placement secured. Negative aspiration confirmed. Solution injected in intermittent fashion, asking for systemic symptoms every 0.5cc of injectate. The needles were then removed and the area cleansed, making sure to leave some of the prepping solution back to take advantage of its long term bactericidal properties.  Technical description of procedure:  Skin & deeper tissues infiltrated with local anesthetic. Appropriate amount of time allowed to pass for local anesthetics to take effect. The procedure needles were then advanced to the target area. Proper needle placement secured. Negative aspiration confirmed. Solution injected in intermittent fashion, asking for systemic symptoms every 0.5cc of injectate. The needles were then removed  and the area cleansed, making sure to leave some of the prepping solution back to take advantage of its long term bactericidal properties.             Vitals:   04/23/22 1106 04/23/22 1140 04/23/22 1148  BP: (!) 176/69 (!) 189/89 (!) 175/66  Pulse: 76    Resp:  18 16  Temp: (!) 97.1 F (36.2 C)    SpO2: 99% 99% 99%  Weight: 169 lb (76.7 kg)    Height: '5\' 4"'$  (1.626 m)       Start Time: 1144 hrs. End Time: 1147 hrs.  Imaging Guidance (Non-Spinal):          Type of Imaging Technique: Fluoroscopy Guidance (Non-Spinal) Indication(s): Assistance in needle guidance and placement for procedures requiring needle placement in or near specific anatomical locations not easily accessible without such assistance. Exposure Time: Please see nurses notes. Contrast: Before injecting any contrast, we confirmed that the patient did not have an allergy to iodine, shellfish, or radiological contrast. Once satisfactory needle placement was completed at the desired level, radiological contrast was injected. Contrast injected under live fluoroscopy. No contrast complications. See chart for type and volume of contrast used. Fluoroscopic Guidance: I was personally present during the use of fluoroscopy. "Tunnel Vision Technique" used to obtain the best possible view of the target area. Parallax error corrected before commencing the procedure. "Direction-depth-direction" technique used to introduce the needle under continuous pulsed fluoroscopy. Once target was reached, antero-posterior, oblique, and lateral fluoroscopic projection used confirm needle placement in all planes. Images permanently stored in EMR. Interpretation: I personally interpreted the imaging intraoperatively. Adequate needle placement confirmed in multiple planes. Appropriate spread of contrast into desired area was observed. No evidence of afferent or efferent intravascular uptake. Permanent images saved into the patient's  record.  Post-operative Assessment:  Post-procedure Vital Signs:  Pulse/HCG Rate: 7686 Temp: (!) 97.1 F (36.2 C) Resp: 16 BP: (!) 175/66 SpO2: 99 %  EBL: None  Complications: No immediate post-treatment complications observed by team, or reported by patient.  Note:  The patient tolerated the entire procedure well. A repeat set of vitals were taken after the procedure and the patient was kept under observation following institutional policy, for this type of procedure. Post-procedural neurological assessment was performed, showing return to baseline, prior to discharge. The patient was provided with post-procedure discharge instructions, including a section on how to identify potential problems. Should any problems arise concerning this procedure, the patient was given instructions to immediately contact us, at any time, without hesitation. In any case, we plan to contact the patient by telephone for a follow-up status report regarding this interventional procedure.  Comments:  No additional relevant information.  Plan of Care  Orders:  Orders Placed This Encounter  Procedures   HIP INJECTION    Scheduling Instructions:     Side: Left-sided     Sedation: No Sedation.     Timeframe: Today   DG PAIN [facility] C-ARM 1-60 MIN NO REPORT    Intraoperative interpretation by procedural physician at [place].    Standing Status:   Standing    Number of Occurrences:   1    Order Specific Question:   Reason for exam:    Answer:   Assistance in needle guidance and placement for procedures requiring needle placement in or near specific anatomical locations not easily accessible without such assistance.   Informed Consent Details: Physician/Practitioner Attestation; Transcribe to consent form and obtain patient signature    Nursing Order: Transcribe to consent form and obtain patient signature. Note: Always confirm laterality of pain with Ms. Worland, before procedure.    Order  Specific Question:   Physician/Practitioner attestation of informed consent for procedure/surgical case    Answer:   I, the physician/practitioner, attest that I have discussed with the patient the benefits, risks, side effects, alternatives, likelihood of achieving goals and potential problems during recovery for the procedure that I have provided informed consent.    Order Specific Question:   Procedure    Answer:   Hip injection    Order Specific Question:   Physician/Practitioner performing the procedure    Answer:   Eloise Mula A. Dossie Arbour, MD    Order Specific Question:   Indication/Reason    Answer:   Hip Joint Pain (Arthralgia)   Provide equipment / supplies at bedside    Procedure tray: "Block Tray" (Disposable  single use) Skin infiltration needle: Regular 1.5-in, 25-G, (x1) Block Needle type: Spinal Amount/quantity: 1 Size: Long (7-inch) Gauge: 22G    Standing Status:   Standing    Number of Occurrences:   1    Order Specific Question:   Specify    Answer:   Block Tray   Chronic Opioid Analgesic:  Tramadol/APAP 37.5/325 (# 120) 1 tab p.o. 4 times daily MME/day: 30 mg/day   Medications ordered for procedure: Meds ordered this encounter  Medications   lidocaine (XYLOCAINE) 2 % (with pres) injection 400 mg   pentafluoroprop-tetrafluoroeth (GEBAUERS) aerosol   ropivacaine (PF) 2 mg/mL (0.2%) (NAROPIN) injection 9 mL   methylPREDNISolone acetate (DEPO-MEDROL) injection 80 mg   iohexol (OMNIPAQUE) 180 MG/ML injection 10 mL    Must be Myelogram-compatible. If not available, you may substitute with a water-soluble, non-ionic, hypoallergenic, myelogram-compatible radiological contrast medium.   Medications administered: We administered lidocaine, pentafluoroprop-tetrafluoroeth, ropivacaine (PF) 2 mg/mL (0.2%), methylPREDNISolone acetate, and iohexol.  See the medical record for exact dosing, route, and time of administration.  Follow-up plan:   Return in about 2 weeks (around  05/07/2022) for Proc-day (T,Th), (F2F), (PPE).  Interventional Therapies  Risk Factors  Complex Considerations:  IDDM Stage 3 CKD   Planned  Pending:      Under consideration:   Therapeutic (TBD) LESI #2    Completed:   Diagnostic/therapeutic left L3-4 LESI x1 (04/02/2022) (100/100/40/40)  Diagnostic/therapeutic left IA hip joint + gluteus medius bursa inj. x1 (04/23/2022)    Completed by other providers:   None at this time   Therapeutic  Palliative (PRN) options:   None established     Recent Visits Date Type Provider Dept  04/02/22 Procedure visit Milinda Pointer, MD [facility]  03/27/22 Office Visit Milinda Pointer, MD [facility]  Showing recent visits within past 90 days and meeting all other requirements Today's Visits Date Type Provider Dept  04/23/22 Office Visit Milinda Pointer, MD [facility]  Showing today's visits and meeting all other requirements Future Appointments Date Type Provider Dept  05/07/22 Appointment Milinda Pointer, MD [facility]  Showing future appointments within next 90 days and meeting all other requirements  Disposition: Discharge home  Discharge (Date  Time): 04/23/2022; 1200 hrs.   Primary Care Physician: Jonetta Osgood, NP Location: [facility] Out[facility] Management Facility Note by: Gaspar Cola, MD Date: 04/23/2022; Time: 12:29 PM  Disclaimer:  Medicine is not an Chief Strategy Officer. The only guarantee in medicine is that nothing is guaranteed. It is important to note that the decision to proceed with this intervention was based on the information collected from the patient. The Data and conclusions were drawn from the patient's questionnaire, the interview, and the physical examination. Because the information was provided in large part by the patient, it cannot be guaranteed that it has not been purposely or unconsciously manipulated. Every effort has been made to obtain  as much relevant data as possible for this evaluation. It is important to note that the conclusions that lead to this procedure are derived in large part from the available data. Always take into account that the treatment will also be dependent on availability of resources and existing treatment guidelines, considered by other Pain Management Practitioners as being common knowledge and practice, at the time of the intervention. For Medico-Legal purposes, it is also important to point out that variation in procedural techniques and pharmacological choices are the acceptable norm. The indications, contraindications, technique, and results of the above procedure should only be interpreted and judged by a Board-Certified Interventional Pain Specialist with extensive familiarity and expertise in the same exact procedure and technique.

## 2022-04-27 ENCOUNTER — Other Ambulatory Visit: Payer: Self-pay | Admitting: Internal Medicine

## 2022-04-27 DIAGNOSIS — I700 Atherosclerosis of aorta: Secondary | ICD-10-CM

## 2022-04-29 DIAGNOSIS — H34832 Tributary (branch) retinal vein occlusion, left eye, with macular edema: Secondary | ICD-10-CM | POA: Diagnosis not present

## 2022-04-30 DIAGNOSIS — H34832 Tributary (branch) retinal vein occlusion, left eye, with macular edema: Secondary | ICD-10-CM | POA: Diagnosis not present

## 2022-05-02 ENCOUNTER — Other Ambulatory Visit: Payer: Self-pay | Admitting: Nurse Practitioner

## 2022-05-02 DIAGNOSIS — Z760 Encounter for issue of repeat prescription: Secondary | ICD-10-CM

## 2022-05-03 ENCOUNTER — Ambulatory Visit (INDEPENDENT_AMBULATORY_CARE_PROVIDER_SITE_OTHER): Payer: Medicare HMO | Admitting: Nurse Practitioner

## 2022-05-03 ENCOUNTER — Encounter: Payer: Self-pay | Admitting: Nurse Practitioner

## 2022-05-03 VITALS — BP 140/70 | HR 82 | Temp 97.6°F | Resp 16 | Ht 64.0 in | Wt 172.2 lb

## 2022-05-03 DIAGNOSIS — I10 Essential (primary) hypertension: Secondary | ICD-10-CM | POA: Diagnosis not present

## 2022-05-03 DIAGNOSIS — M4800 Spinal stenosis, site unspecified: Secondary | ICD-10-CM

## 2022-05-03 DIAGNOSIS — Z794 Long term (current) use of insulin: Secondary | ICD-10-CM

## 2022-05-03 DIAGNOSIS — E1122 Type 2 diabetes mellitus with diabetic chronic kidney disease: Secondary | ICD-10-CM

## 2022-05-03 DIAGNOSIS — M4316 Spondylolisthesis, lumbar region: Secondary | ICD-10-CM | POA: Diagnosis not present

## 2022-05-03 LAB — POCT GLYCOSYLATED HEMOGLOBIN (HGB A1C): Hemoglobin A1C: 7.2 % — AB (ref 4.0–5.6)

## 2022-05-03 NOTE — Progress Notes (Signed)
[facility] [place], [place] [address]  Internal MEDICINE  Office Visit Note  Patient Name: Kara [name]  [id number]  [id number]  Date of Service: 05/03/2022  Chief Complaint  Patient presents with   Follow-up   Hyperlipidemia   Hypertension   Diabetes    HPI Tomoko presents for a follow-up visit for dibetes, back and hip, elevated BP Diabetes -- 25 units levemir daily -- has enough for 5-6 months. Levemir will not be covered anymore by insurance and will need to switch to lantus, toujeo or tresiba.  Back and hip pain -- sees PA on 25th of January Elevated BP, rechecked. Improved when rechecked    Current Medication: Outpatient Encounter Medications as of 05/03/2022  Medication Sig Note   Aflibercept (EYLEA) 2 MG/0.05ML SOLN  12/22/2021: Every 3 months   aspirin EC 81 MG tablet Take 81 mg by mouth daily.     cholecalciferol (VITAMIN D) 1000 UNITS tablet Take 2,000 Units by mouth daily.    doxycycline (MONODOX) 100 MG capsule Take 100 mg by mouth 2 (two) times daily.    Dulaglutide (TRULICITY) 1.5 VQ/2.5ZD SOPN Inject 1.5 mg into the skin once a week.    Ferrous Gluconate 324 (37.5 Fe) MG TABS Take 324 mg by mouth daily.    furosemide (LASIX) 20 MG tablet Take 2 tablets by mouth in the AM and take 1 tablet by mouth in the PM. May add 1 tablet to afternoon dose if swelling not improved.    insulin aspart (NOVOLOG FLEXPEN) 100 UNIT/ML FlexPen INJECT [place] 3 times daily with meals AS DIRECTED PER SLIDING SCALE. MAX DAILY DOSE IS 30 UNITS. DX E11.65.    insulin aspart (NOVOLOG FLEXPEN) 100 UNIT/ML FlexPen INJECT UNDER THE SKIN TWICE DAILY AS DIRECTED PER SLIDING SCALE. MAX DAILY DOSE IS 30 UNITS.    insulin detemir (LEVEMIR FLEXPEN) 100 UNIT/ML FlexPen INJECT UP TO 75 UNITS UNDER THE SKIN DAILY, TITRATE AS NEEDED. 03/27/2022: 20 units   Lactobacillus (PROBIOTIC ACIDOPHILUS) CAPS Take 1 capsule by mouth daily.    Multiple Vitamin (MULTIVITAMIN) tablet Take 1 tablet  by mouth daily at 12 noon.    pantoprazole (PROTONIX) 40 MG tablet TAKE 1 TABLET BY MOUTH EVERY DAY    pregabalin (LYRICA) 100 MG capsule TAKE 1 CAPSULE BY MOUTH TWICE A DAY    rosuvastatin (CRESTOR) 20 MG tablet TAKE 1 TABLET BY MOUTH EVERYDAY AT BEDTIME    tamoxifen (NOLVADEX) 20 MG tablet Take 1 tablet (20 mg total) by mouth daily.    traMADol-acetaminophen (ULTRACET) 37.5-325 MG tablet Take 1 tablet by mouth every 6 (six) hours as needed for moderate pain or severe pain.    No facility-administered encounter medications on file as of 05/03/2022.    Surgical History: Past Surgical History:  Procedure Laterality Date   BILATERAL SALPINGECTOMY     BREAST BIOPSY Right 2014   neg- core   BREAST BIOPSY Right 12/21/2018   [place] bx, venus clip,  DUCTAL CARCINOMA IN SITU   BREAST BIOPSY Right 12/21/2018   [place] bx, ribbon clip,  FIBROEPITHELIAL PROLIFERATION WITH SCLEROSIS   BREAST BIOPSY Right 01/01/2019   Affirm bx "X" clip-path pending   BREAST DUCTAL SYSTEM EXCISION     BREAST EXCISIONAL BIOPSY Right 2014   neg   BREAST LUMPECTOMY Right 01/22/2019   path pending   CATARACT EXTRACTION     CHOLECYSTECTOMY     CYSTOSCOPY WITH STENT PLACEMENT Left 04/14/2015   Procedure: CYSTOSCOPY WITH STENT PLACEMENT;  Surgeon: Hollice Espy, MD;  Location: [facility];  Service: Urology;  Laterality: Left;   EXTRACORPOREAL SHOCK WAVE LITHOTRIPSY Left 09/29/2014   Procedure: EXTRACORPOREAL SHOCK WAVE LITHOTRIPSY (ESWL);  Surgeon: Hollice Espy, MD;  Location: [facility];  Service: Urology;  Laterality: Left;   EYE SURGERY     bilateral cataract   IRRIGATION AND DEBRIDEMENT ABSCESS Right 03/24/2019   Procedure: IRRIGATION AND DEBRIDEMENT ABSCESS RIGHT BREAST;  Surgeon: Herbert Pun, MD;  Location: [facility];  Service: General;  Laterality: Right;   LAPAROSCOPIC OOPHERECTOMY Left    PARTIAL MASTECTOMY WITH NEEDLE LOCALIZATION Right 01/22/2019   Procedure: PARTIAL MASTECTOMY WITH NEEDLE LOCALIZATION;   Surgeon: Herbert Pun, MD;  Location: [facility];  Service: General;  Laterality: Right;   TONSILLECTOMY     TUBAL LIGATION     URETEROSCOPY WITH HOLMIUM LASER LITHOTRIPSY Left 04/14/2015   Procedure: URETEROSCOPY WITH HOLMIUM LASER LITHOTRIPSY;  Surgeon: Hollice Espy, MD;  Location: [facility];  Service: Urology;  Laterality: Left;    Medical History: Past Medical History:  Diagnosis Date   Anemia    Arthropathy    Benign neoplasm of breast    Breast cancer ([place])    Chest pain    Chronic kidney disease    30% kidney function   Diabetes (HCC)    Diabetic retinopathy ([place])    Dyspnea    Edema    History of kidney stones    Hyperlipemia    Hypersomnia with sleep apnea    Hypertension    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HCC)    Lumbago    Osteoarthrosis    Ovarian failure    Personal history of radiation therapy    Pneumonia 12/21/2021   covid with pneumonia( hospitalization)   SOB (shortness of breath)     Family History: Family History  Problem Relation Age of Onset   Breast cancer Maternal Grandmother 88   Colon cancer Mother    Lung cancer Father     Social History   Socioeconomic History   Marital status: Married    Spouse name: Not on file   Number of children: Not on file   Years of education: Not on file   Highest education level: Not on file  Occupational History   Not on file  Tobacco Use   Smoking status: Former    Packs/day: 1.00    Years: 30.00    Total pack years: 30.00    Types: Cigarettes    Quit date: 04/11/1995    Years since quitting: 27.0   Smokeless tobacco: Never  Vaping Use   Vaping Use: Never used  Substance and Sexual Activity   Alcohol use: No    Alcohol/week: 0.0 standard drinks of alcohol   Drug use: No   Sexual activity: Not on file  Other Topics Concern   Not on file  Social History Narrative   Not on file   Social Determinants of Health   Financial Resource Strain: Low Risk  (10/03/2020)   Overall  Financial Resource Strain (CARDIA)    Difficulty of Paying Living Expenses: Not very hard  Food Insecurity: Not on file  Transportation Needs: Not on file  Physical Activity: Not on file  Stress: Not on file  Social Connections: Not on file  Intimate Partner Violence: Not on file      Review of Systems  Constitutional:  Positive for fatigue. Negative for chills and unexpected weight change.  HENT:  Negative for congestion, rhinorrhea, sneezing and sore throat.  Eyes:  Negative for redness.  Respiratory: Negative.  Negative for cough, chest tightness, shortness of breath and wheezing.   Cardiovascular: Negative.  Negative for chest pain and palpitations.  Gastrointestinal:  Negative for abdominal pain, constipation, diarrhea, nausea and vomiting.  Genitourinary:  Negative for dysuria and frequency.  Musculoskeletal:  Positive for arthralgias, back pain and gait problem (using cane). Negative for joint swelling and neck pain.  Skin:  Negative for rash.  Neurological:  Negative for tremors and numbness.  Hematological:  Negative for adenopathy. Does not bruise/bleed easily.  Psychiatric/Behavioral:  Negative for behavioral problems (Depression), sleep disturbance and suicidal ideas. The patient is not nervous/anxious.     Vital Signs: BP (!) 140/70 Comment: 154/72  Pulse 82   Temp 97.6 F (36.4 C)   Resp 16   Ht '5\' 4"'$  (1.626 m)   Wt 172 lb 3.2 oz (78.1 kg)   BMI 29.56 kg/m    Physical Exam Vitals reviewed.  Constitutional:      General: She is not in acute distress.    Appearance: Normal appearance. She is obese. She is ill-appearing.  HENT:     Head: Normocephalic and atraumatic.  Eyes:     Pupils: Pupils are equal, round, and reactive to light.  Cardiovascular:     Rate and Rhythm: Normal rate and regular rhythm.  Pulmonary:     Effort: Pulmonary effort is normal. No respiratory distress.  Neurological:     Mental Status: She is alert and oriented to person,  place, and time.  Psychiatric:        Mood and Affect: Mood normal.        Behavior: Behavior normal.        Assessment/Plan: 1. Type 2 diabetes mellitus with chronic kidney disease, with long-term current use of insulin, unspecified CKD stage (HCC) A1c  increased to 7.2, taking 25 units of levemir daily. Glucose readings are improving. Continue as discussed. Follow up in 3 months for repeat A1c.  - POCT glycosylated hemoglobin (Hb A1C)  2. Essential hypertension Stable, continue medication as prescribed.   3. Central stenosis of spinal canal Improved some, no longer in wheelchair, using cane, still in a lot of pain, will most likely need surgery per patient, has appt on 05/16/21 with surgeon.       General Counseling: [name] understanding of the findings of todays visit and agrees with plan of treatment. I have discussed any further diagnostic evaluation that may be needed or ordered today. We also reviewed her medications today. she has been encouraged to call the office with any questions or concerns that should arise related to todays visit.    Orders Placed This Encounter  Procedures   POCT glycosylated hemoglobin (Hb A1C)    No orders of the defined types were placed in this encounter.   Return in about 3 months (around 08/06/2022) for F/U, Recheck A1C, Jeryl Wilbourn PCP.   Total time spent:30 Minutes Time spent includes review of chart, medications, test results, and follow up plan with the patient.   [place] Controlled Substance Database was reviewed by me.  This patient was seen by Jonetta Osgood, FNP-C in collaboration with Dr. Clayborn Bigness as a part of collaborative care agreement.   Saisha Hogue R. Valetta Fuller, MSN, FNP-C Internal medicine

## 2022-05-06 NOTE — Patient Instructions (Signed)
  ____________________________________________________________________________________________  Patient Information update  To: All of our patients.  Re: Name change.  It has been made official that our current name, "[facility]"   will soon be changed to "[facility]".   The purpose of this change is to eliminate any confusion created by the concept of our practice being a "Medication Management Pain Clinic". In the past this has led to the misconception that we treat pain primarily by the use of prescription medications.  Nothing can be farther from the truth.   Understanding PAIN MANAGEMENT: To further understand what our practice does, you first have to understand that "Pain Management" is a subspecialty that requires additional training once a physician has completed their specialty training, which can be in either Anesthesia, Neurology, Psychiatry, or Physical Medicine and Rehabilitation (PMR). Each one of these contributes to the final approach taken by each physician to the management of their patient's pain. To be a "Pain Management Specialist" you must have first completed one of the specialty trainings below.  Anesthesiologists - trained in clinical pharmacology and interventional techniques such as nerve blockade and regional as well as central neuroanatomy. They are trained to block pain before, during, and after surgical interventions.  Neurologists - trained in the diagnosis and pharmacological treatment of complex neurological conditions, such as Multiple Sclerosis, Parkinson's, spinal cord injuries, and other systemic conditions that may be associated with symptoms that may include but are not limited to pain. They tend to rely primarily on the treatment of chronic pain using prescription medications.  Psychiatrist - trained in conditions affecting the psychosocial  wellbeing of patients including but not limited to depression, anxiety, schizophrenia, personality disorders, addiction, and other substance use disorders that may be associated with chronic pain. They tend to rely primarily on the treatment of chronic pain using prescription medications.   Physical Medicine and Rehabilitation (PMR) physicians, also known as physiatrists - trained to treat a wide variety of medical conditions affecting the brain, spinal cord, nerves, bones, joints, ligaments, muscles, and tendons. Their training is primarily aimed at treating patients that have suffered injuries that have caused severe physical impairment. Their training is primarily aimed at the physical therapy and rehabilitation of those patients. They may also work alongside orthopedic surgeons or neurosurgeons using their expertise in assisting surgical patients to recover after their surgeries.  INTERVENTIONAL PAIN MANAGEMENT is sub-subspecialty of Pain Management.  Our physicians are Board-certified in Anesthesia, Pain Management, and Interventional Pain Management.  This meaning that not only have they been trained and Board-certified in their specialty of Anesthesia, and subspecialty of Pain Management, but they have also received further training in the sub-subspecialty of Interventional Pain Management, in order to become Board-certified as INTERVENTIONAL PAIN MANAGEMENT SPECIALIST.    Mission: Our goal is to use our skills in  INTERVENTIONAL PAIN MANAGEMENT as alternatives to the chronic use of prescription opioid medications for the treatment of pain. To make this more clear, we have changed our name to reflect what we do and offer. We will continue to offer medication management assessment and recommendations, but we will not be taking over any patient's medication management.  ____________________________________________________________________________________________     

## 2022-05-07 ENCOUNTER — Ambulatory Visit: Payer: Medicare HMO | Attending: Pain Medicine | Admitting: Pain Medicine

## 2022-05-07 ENCOUNTER — Encounter: Payer: Self-pay | Admitting: Pain Medicine

## 2022-05-07 VITALS — BP 175/81 | HR 59 | Temp 97.4°F | Resp 14 | Ht 64.0 in | Wt 170.0 lb

## 2022-05-07 DIAGNOSIS — M5106 Intervertebral disc disorders with myelopathy, lumbar region: Secondary | ICD-10-CM

## 2022-05-07 DIAGNOSIS — M25552 Pain in left hip: Secondary | ICD-10-CM

## 2022-05-07 DIAGNOSIS — G8929 Other chronic pain: Secondary | ICD-10-CM

## 2022-05-07 DIAGNOSIS — M48062 Spinal stenosis, lumbar region with neurogenic claudication: Secondary | ICD-10-CM

## 2022-05-07 DIAGNOSIS — M5137 Other intervertebral disc degeneration, lumbosacral region: Secondary | ICD-10-CM

## 2022-05-07 DIAGNOSIS — M25652 Stiffness of left hip, not elsewhere classified: Secondary | ICD-10-CM | POA: Diagnosis not present

## 2022-05-07 DIAGNOSIS — M5416 Radiculopathy, lumbar region: Secondary | ICD-10-CM

## 2022-05-07 DIAGNOSIS — M51379 Other intervertebral disc degeneration, lumbosacral region without mention of lumbar back pain or lower extremity pain: Secondary | ICD-10-CM

## 2022-05-07 DIAGNOSIS — M5442 Lumbago with sciatica, left side: Secondary | ICD-10-CM | POA: Diagnosis not present

## 2022-05-07 DIAGNOSIS — M5410 Radiculopathy, site unspecified: Secondary | ICD-10-CM

## 2022-05-07 DIAGNOSIS — G834 Cauda equina syndrome: Secondary | ICD-10-CM

## 2022-05-07 DIAGNOSIS — M4317 Spondylolisthesis, lumbosacral region: Secondary | ICD-10-CM

## 2022-05-07 DIAGNOSIS — M4316 Spondylolisthesis, lumbar region: Secondary | ICD-10-CM | POA: Diagnosis not present

## 2022-05-07 DIAGNOSIS — R1032 Left lower quadrant pain: Secondary | ICD-10-CM | POA: Diagnosis not present

## 2022-05-07 DIAGNOSIS — R937 Abnormal findings on diagnostic imaging of other parts of musculoskeletal system: Secondary | ICD-10-CM | POA: Diagnosis present

## 2022-05-07 NOTE — Progress Notes (Signed)
PROVIDER NOTE: Information contained herein reflects review and annotations entered in association with encounter. Interpretation of such information and data should be left to medically-trained personnel. Information provided to patient can be located elsewhere in the medical record under "Patient Instructions". Document created using STT-dictation technology, any transcriptional errors that may result from process are unintentional.    Patient: Kara [name]  Service Category: E/M  Provider: Gaspar Cola, MD  DOB: [birth year]  DOS: 05/07/2022  Referring Provider: Jonetta Osgood, NP  MRN: [id number]  Specialty: Interventional Pain Management  PCP: Jonetta Osgood, NP  Type: Established Patient  Setting: Ambulatory outpatient    Location: Office  Delivery: Face-to-face     HPI  [name], a 77 y.o. year old female, is here today because of her Hip pain, acute, left [M25.552]. Ms. Tapper primary complain today is Back Pain (Bilateral buttocks, worse on left ) Last encounter: My last encounter with her was on 04/23/2022. Pertinent problems: Ms. Humm has Inflammatory polyarthropathy ([place]); Diabetic polyneuropathy associated with type 2 diabetes mellitus ([place]); Ductal carcinoma in situ (DCIS) of right breast; Lower extremity edema (Bilateral); Synovial cyst of right popliteal space; Abnormal MRI, lumbar spine (03/12/2022) ([facility]); Hip pain, acute (1ry area of Pain) (Left); Radicular pain of lower extremity (Left); Groin pain (Left); Anterolisthesis (Grade 2) of lumbar spine (L4/L5); Anterolisthesis of lumbosacral spine (L5/S1); Chronic [name] back pain (Left)  w/ sciatica (Left); Lumbar radiculopathy (Left); Herniation of lumbar intervertebral disc w/ radiculopathy; Pain in joint, pelvic region and thigh; Pelvic and perineal pain; DDD (degenerative disc disease), lumbosacral; Rotatory Levoscoliosis of lumbar spine (L3-L5); IVDD w/ compression of cauda equina (HCC) (L3-4, L4-5);  Lumbar central spinal stenosis w/ neurogenic claudication (L2-L5); Lumbosacral foraminal stenosis; Lumbar facet arthropathy (Multilevel) (Bilateral); Arthropathy of spinal facet joint concurrent with and due to effusion; Compression of lumbar nerve root (L3-4, L4-5); Herniation of intervertebral disc between L4 and L5; Impaired range of motion of hip (Left); Primary osteoarthritis of hip (Left); and Chronic hip pain (Left) on their pertinent problem list. Pain Assessment: Severity of Chronic pain is reported as a 8 /10. Location: Back Lower/left leg to the toes. Onset: More than a month ago. Quality: Throbbing. Timing: Constant. Modifying factor(s): ice, heat. Vitals:  height is '5\' 4"'$  (1.626 m) and weight is 170 lb (77.1 kg). Her temporal temperature is 97.4 F (36.3 C) (abnormal). Her blood pressure is 175/81 (abnormal) and her pulse is 59 (abnormal). Her respiration is 14 and oxygen saturation is 92%.  BMI: Estimated body mass index is 29.18 kg/m as calculated from the following:   Height as of this encounter: '5\' 4"'$  (1.626 m).   Weight as of this encounter: 170 lb (77.1 kg).  Reason for encounter: post-procedure evaluation and assessment.  While the patient did get 100% relief of the pain for the duration of the local anesthetic at the time of the L3-4 LESI, she only attained 20% relief with the left hip injection which proves that at the very least 80% of the patient that she is experiencing in the area of the hips is secondary to the severe spinal stenosis at the L4-5 level.  Has seen on the report updates 03/12/2022 MRI done at [facility], the patient has severe spinal stenosis at the L4-5 level, secondary to a grade 2 anterolisthesis of L4 over L5.  Today the patient reports that the pain that she is experiencing in the hip area has now changed and it seems to be a lot more in the  perineal area.  She has noticed that when she has a bowel movement she will experience pain in the perineum, rectal area,  and going down both lower extremities.  She denies currently having any bowel or bladder incontinence but she is experiencing pain in those areas.  According to the MRI below, she has compression of the cauda equina at the L4-5 level.  This has been described as severe and this is the main reason why we avoided going directly to the L4-5 level and instead we went to the L3-4 level.  The epidural at that level did provide her with 100% relief of the pain for the duration of the local anesthetic but then it went down to only a 40% improvement.  Although she is currently not as bad as she was when she came in for the first time, she still very uncomfortable and continues to ambulate in a wheelchair.  She indicates having seen Dr. Cari Caraway and having an appointment on 05/16/2022 for reevaluation.  At this point I believe that the best option for this patient is to have her severe spinal stenosis and cauda equina compression surgically treated.  I will be more than happy to see the patient in the future, should she continue to have other problems with the lower back, which I would expect.  However, her current radiculopathy did not seem to respond well to the epidural steroid injection and therefore I believe that the best hold for this patient is to have the surgical decompression.  (03/12/2022) LUMBAR MRI FINDINGS: Alignment: 5 mm L4-5 apophyseal anterolisthesis.  Focal spondylotic levoscoliosis L3-L5. Vertebral bodies: Mild-moderate multilevel degenerative vertebral height loss with marginal endplate osteophyte spurring. Marrow signal: L3-4 and L4-5 advanced intervertebral osteochondrosis with adjacent vertebral endplate stress osteoedema (type I Modic changes). Intervertebral discs: Disc desiccation and height loss at multiple levels. Cauda equina: Compression at L4-5. Paraspinal soft tissues: Bilateral multifocal tiny cortical simple renal cyst.  Sigmoid diverticulosis.  Moderate sarcopenia of the lower  paraspinal/erector spinae muscle. DISC LEVELS:  T12-L1: Normal L1-2: Normal L2-3: Mild bilateral facet arthrosis and ligamentum flavum thickening.  Diffuse disc bulge.  Mild spinal canal stenosis and mild narrowing of the neural foramina. L3-4: Mild bilateral facet arthrosis and ligamentum flavum thickening.  Total disc collapse with disc osteophyte complex formation.  Moderate central canal stenosis with compression of the descending nerve roots.  Moderate discogenic narrowing of the neuroforamina with focal compression of the exiting L3 nerve roots. L4-5: Severe bilateral facet arthrosis with trace effusions.  Ligamentum flavum thickening.  5 mm apophyseal anterolisthesis.  Intervertebral disc unroofing with diffuse pseudobulge of listhesis and superimposed broad disc extrusion type herniation with superior migration.  Severe central spinal canal stenosis with compression of the cauda equina roots.  Severe discogenic narrowing of the neuroforamina with impingement of the exiting nerve roots. L5-S1: Severe bilateral facet arthrosis.  Trace right joint effusion.  Nominal apophyseal anterolisthesis.  Intervertebral disc unroofing and subligamentous asymmetric pseudo bulge or listhesis.  Mild narrowing of the neural foramina abutting the exiting nerve roots.  Conclusion: 1.  Severe central spinal canal stenosis with compression of the cauda equina roots at L4-5 due to a 5 mm hypophyseal anterolisthesis, diffuse pseudobulge of listhesis, ligamentum flavum thickening and facet arthrosis.  Superimposed broad disc extrusion type herniation with superior migration.  Severe discogenic narrowing of the neural foramina with impingement of the exiting L4 nerve roots. 2.  Moderate central spinal canal stenosis at the L3-4 due to advanced intervertebral osteochondrosis, total disc  collapse and disc osteophyte complex formation that compresses the descending L4 nerve roots.  Moderate discogenic narrowing of the  neuroforamina with focal compression of the exiting L3 nerve roots. 3.  Severe bilateral L5-S1 facet arthrosis with trace right joint effusion.  Nominal apophyseal anterolisthesis.  Asymmetric psuedobulge of listhesis.  Mild narrowing of the neuroforamina abutting the exiting L5 nerve roots.   Post-procedure evaluation   Type:  Intra-articular hip & peri-articular bursae injection #1  Laterality: Left (-LT)  Level: Lower pelvic and hip joint level.  Imaging: Fluoroscopy-guided         Anesthesia: Local anesthesia (1-2% Lidocaine) Anxiolysis: None                 Sedation: No Sedation                       DOS: 04/23/2022  Performed by: Gaspar Cola, MD  Purpose: Diagnostic/Therapeutic Indications: Hip pain severe enough to impact quality of life or function. Rationale (medical necessity): procedure needed and proper for the diagnosis and/or treatment of Ms. Delmar's medical symptoms and needs. 1. Hip pain, acute, left   2. Chronic hip pain (Left)   3. Primary osteoarthritis of hip (Left)   4. Groin pain (Left)   5. Impaired range of motion of hip (Left)    NAS-11 Pain score:   Pre-procedure: 7 /10   Post-procedure: 4 /10       Effectiveness:  Initial hour after procedure: 20 %. Subsequent 4-6 hours post-procedure: 20 %. Analgesia past initial 6 hours: 0 %. Ongoing improvement:  Analgesic: No benefit Function: No benefit ROM: No benefit  Pharmacotherapy Assessment  Analgesic: Tramadol/APAP 37.5/325 (# 120) 1 tab p.o. 4 times daily MME/day: 30 mg/day   Monitoring: [place] PMP: PDMP reviewed during this encounter.       Pharmacotherapy: No side-effects or adverse reactions reported. Compliance: No problems identified. Effectiveness: Clinically acceptable.  No notes on file  No results found for: "CBDTHCR" No results found for: "D8THCCBX" No results found for: "D9THCCBX"  UDS:  No results found for: "SUMMARY"    ROS  Constitutional: Denies any fever or  chills Gastrointestinal: No reported hemesis, hematochezia, vomiting, or acute GI distress Musculoskeletal: Denies any acute onset joint swelling, redness, loss of ROM, or weakness Neurological: No reported episodes of acute onset apraxia, aphasia, dysarthria, agnosia, amnesia, paralysis, loss of coordination, or loss of consciousness  Medication Review  Aflibercept, Dulaglutide, Ferrous Gluconate, Probiotic Acidophilus, aspirin EC, cholecalciferol, doxycycline, furosemide, insulin aspart, insulin detemir, multivitamin, pantoprazole, pregabalin, rosuvastatin, tamoxifen, and traMADol-acetaminophen  History Review  Allergy: Ms. Montecalvo is allergic to amoxicillin. Drug: Ms. Jentz  reports no history of drug use. Alcohol:  reports no history of alcohol use. Tobacco:  reports that she quit smoking about 27 years ago. Her smoking use included cigarettes. She has a 30.00 pack-year smoking history. She has never used smokeless tobacco. Social: Ms. Wenberg  reports that she quit smoking about 27 years ago. Her smoking use included cigarettes. She has a 30.00 pack-year smoking history. She has never used smokeless tobacco. She reports that she does not drink alcohol and does not use drugs. Medical:  has a past medical history of Anemia, Arthropathy, Benign neoplasm of breast, Breast cancer ([place]), Chest pain, Chronic kidney disease, Diabetes ([place]), Diabetic retinopathy ([place]), Dyspnea, Edema, History of kidney stones, Hyperlipemia, Hypersomnia with sleep apnea, Hypertension, 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[place]), Lumbago, Osteoarthrosis, Ovarian failure, Personal history of radiation therapy, Pneumonia (12/21/2021), and SOB (  shortness of breath). Surgical: Ms. Rummell  has a past surgical history that includes Extracorporeal shock wave lithotripsy (Left, 09/29/2014); Laparoscopic oopherectomy (Left); Tonsillectomy; Cholecystectomy; Cataract extraction; Breast ductal system excision; Ureteroscopy with  holmium laser lithotripsy (Left, 04/14/2015); Cystoscopy with stent placement (Left, 04/14/2015); Eye surgery; Tubal ligation; Bilateral salpingectomy; Breast biopsy (Right, 2014); Breast excisional biopsy (Right, 2014); Breast biopsy (Right, 12/21/2018); Breast biopsy (Right, 12/21/2018); Breast biopsy (Right, 01/01/2019); Breast lumpectomy (Right, 01/22/2019); Partial mastectomy with needle localization (Right, 01/22/2019); and Irrigation and debridement abscess (Right, 03/24/2019). Family: family history includes Breast cancer (age of onset: 53) in her maternal grandmother; Colon cancer in her mother; Lung cancer in her father.  Laboratory Chemistry Profile   Renal Lab Results  Component Value Date   BUN 32 (H) 12/27/2021   CREATININE 1.53 (H) 12/27/2021   BCR 20 11/06/2021   GFRAA 39 (L) 08/16/2019   GFRNONAA 35 (L) 12/27/2021    Hepatic Lab Results  Component Value Date   AST 34 12/27/2021   ALT 37 12/27/2021   ALBUMIN 2.6 (L) 12/27/2021   ALKPHOS 59 12/27/2021    Electrolytes Lab Results  Component Value Date   NA 143 12/27/2021   K 3.4 (L) 12/27/2021   CL 106 12/27/2021   CALCIUM 8.8 (L) 12/27/2021   MG 2.0 12/27/2021   PHOS 3.3 12/27/2021    Bone Lab Results  Component Value Date   VD25OH 41.7 11/06/2021    Inflammation (CRP: Acute Phase) (ESR: Chronic Phase) Lab Results  Component Value Date   CRP 0.8 12/27/2021   LATICACIDVEN 0.9 12/23/2021         Note: Above Lab results reviewed.  Recent Imaging Review  DG PAIN CLINIC C-ARM 1-60 MIN NO REPORT Fluoro was used, but no Radiologist interpretation will be provided.  Please refer to "NOTES" tab for provider progress note. Note: Reviewed        Physical Exam  General appearance: Well nourished, well developed, and well hydrated. In no apparent acute distress Mental status: Alert, oriented x 3 (person, place, & time)       Respiratory: No evidence of acute respiratory distress Eyes: PERLA Vitals: BP (!)  175/81   Pulse (!) 59   Temp (!) 97.4 F (36.3 C) (Temporal)   Resp 14   Ht '5\' 4"'$  (1.626 m)   Wt 170 lb (77.1 kg)   SpO2 92%   BMI 29.18 kg/m  BMI: Estimated body mass index is 29.18 kg/m as calculated from the following:   Height as of this encounter: '5\' 4"'$  (1.626 m).   Weight as of this encounter: 170 lb (77.1 kg). Ideal: Ideal body weight: 54.7 kg (120 lb 9.5 oz) Adjusted ideal body weight: 63.7 kg (140 lb 5.7 oz)  Assessment   Diagnosis Status  1. Hip pain, acute, left   2. Chronic hip pain (Left)   3. Groin pain (Left)   4. Chronic [name] back pain (Left)  w/ sciatica (Left)   5. Impaired range of motion of hip (Left)   6. Grade 2 Anterolisthesis of lumbar spine (L4/L5)   7. Anterolisthesis of lumbosacral spine (L5/S1)   8. Compression of lumbar nerve root (L3-4, L4-5)   9. DDD (degenerative disc disease), lumbosacral   10. IVDD w/ compression of cauda equina (HCC) (L3-4, L4-5)   11. Lumbar central spinal stenosis w/ neurogenic claudication (L2-L5)   12. Radicular pain of lower extremity (Left)   13. Abnormal MRI, lumbar spine (03/12/2022) ([facility])    Controlled Controlled Controlled  Updated Problems: No problems updated.  Plan of Care  Problem-specific:  No problem-specific Assessment & Plan notes found for this encounter.  [name] has a current medication list which includes the following long-term medication(s): ferrous gluconate, furosemide, novolog flexpen, novolog flexpen, levemir flexpen, pantoprazole, pregabalin, and rosuvastatin.  Pharmacotherapy (Medications Ordered): No orders of the defined types were placed in this encounter.  Orders:  No orders of the defined types were placed in this encounter.  Follow-up plan:   Return if symptoms worsen or fail to improve.     Interventional Therapies  Risk Factors  Complex Considerations:  IDDM Stage 3 CKD   Planned  Pending:      Under consideration:   Therapeutic (TBD) LESI #2     Completed:   Diagnostic/therapeutic left L3-4 LESI x1 (04/02/2022) (100/100/40/40)  Diagnostic/therapeutic left IA hip joint + gluteus medius bursa inj. x1 (04/23/2022) (20/20/0/0)    Completed by other providers:   None at this time   Therapeutic  Palliative (PRN) options:   None established      Recent Visits Date Type Provider Dept  04/23/22 Office Visit Milinda Pointer, MD [facility]  04/02/22 Procedure visit Milinda Pointer, MD [facility]  03/27/22 Office Visit Milinda Pointer, MD [facility]  Showing recent visits within past 90 days and meeting all other requirements Today's Visits Date Type Provider Dept  05/07/22 Office Visit Milinda Pointer, MD [facility]  Showing today's visits and meeting all other requirements Future Appointments No visits were found meeting these conditions. Showing future appointments within next 90 days and meeting all other requirements  I discussed the assessment and treatment plan with the patient. The patient was provided an opportunity to ask questions and all were answered. The patient agreed with the plan and demonstrated an understanding of the instructions.  Patient advised to call back or seek an in-person evaluation if the symptoms or condition worsens.  Duration of encounter: 35 minutes.  Total time on encounter, as per AMA guidelines included both the face-to-face and non-face-to-face time personally spent by the physician and/or other qualified health care professional(s) on the day of the encounter (includes time in activities that require the physician or other qualified health care professional and does not include time in activities normally performed by clinical staff). Physician's time may include the following activities when performed: Preparing to see the patient (e.g., pre-charting review of records, searching for previously ordered imaging, lab work, and nerve  conduction tests) Review of prior analgesic pharmacotherapies. Reviewing PMP Interpreting ordered tests (e.g., lab work, imaging, nerve conduction tests) Performing post-procedure evaluations, including interpretation of diagnostic procedures Obtaining and/or reviewing separately obtained history Performing a medically appropriate examination and/or evaluation Counseling and educating the patient/family/caregiver Ordering medications, tests, or procedures Referring and communicating with other health care professionals (when not separately reported) Documenting clinical information in the electronic or other health record Independently interpreting results (not separately reported) and communicating results to the patient/ family/caregiver Care coordination (not separately reported)  Note by: Gaspar Cola, MD Date: 05/07/2022; Time: 1:08 PM

## 2022-05-08 NOTE — Progress Notes (Unsigned)
Referring Physician:  Jonetta Osgood, NP [place],  [place] [address]  Primary Physician:  Jonetta Osgood, NP  History of Present Illness: 05/16/2022 [name] has a history of breast CA, CKD, DM, HTN.   Last seen by me on 03/21/22 for constant LBP with posterior bilateral leg pain to her feet.   She has known slip at L4-L5 and dynamic slip at L5-S1 along with severe DDD L3-L5. She has moderate central stenosis L3-L4 and severe central stenosis L4-L5. She had some mild weakness in left leg some of which may be pain mediated.   She was sent to pain management to discuss injections. She had both left L3-L4 IL on 04/02/22 and left hip injection on 04/23/22.   Dr. Dossie Arbour recommended she follow up with [place] to discuss surgery options.   She continues with constant LBP with posterior bilateral leg pain, left > right, to her feet that has become constant. Pain is worse with sitting or standing. LBP > leg pain. She has known DM neuropathy and has intermittent numbness and tingling. She notes some weakness in her left leg. Grocery cart helps her walk better. Some improvement with sitting on an ice pack.   No perineal numbness. No bowel or bladder incontinence. She has some intermittent pressure in her vaginal region.   She is taking lyrica for her neuropathy. She is on ultram prn for her back pain.    Conservative measures:  Physical therapy: did HHPT starting in September for 2-3 months Multimodal medical therapy including regular antiinflammatories: ultracet, lyrica Injections:  Left L3-L4 IL ESI on 04/02/22 (helped 40%) Left intra-articular hip and peri-articular bursae injection 04/23/22 (helped 20%)  Past Surgery: none  Kara [name] has no symptoms of cervical myelopathy.  The symptoms are causing a significant impact on the patient's life.   Review of Systems:  A 10 point review of systems is negative, except for the pertinent positives and negatives  detailed in the HPI.  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[place])    Lumbago    Osteoarthrosis    Ovarian failure    Personal history of radiation therapy    Pneumonia 12/21/2021   covid with pneumonia( hospitalization)   SOB (shortness of breath)     Past Surgical History: Past Surgical History:  Procedure Laterality Date   BILATERAL SALPINGECTOMY     BREAST BIOPSY Right 2014   neg- core   BREAST BIOPSY Right 12/21/2018   [place] bx, venus clip,  DUCTAL CARCINOMA IN SITU   BREAST BIOPSY Right 12/21/2018   [place] bx, ribbon clip,  FIBROEPITHELIAL PROLIFERATION WITH SCLEROSIS   BREAST BIOPSY Right 01/01/2019   Affirm bx "X" clip-path pending   BREAST DUCTAL SYSTEM EXCISION     BREAST EXCISIONAL BIOPSY Right 2014   neg   BREAST LUMPECTOMY Right 01/22/2019   path pending   CATARACT EXTRACTION     CHOLECYSTECTOMY     CYSTOSCOPY WITH STENT PLACEMENT Left 04/14/2015   Procedure:  CYSTOSCOPY WITH STENT PLACEMENT;  Surgeon: Hollice Espy, MD;  Location: [facility];  Service: Urology;  Laterality: Left;   EXTRACORPOREAL SHOCK WAVE LITHOTRIPSY Left 09/29/2014   Procedure: EXTRACORPOREAL SHOCK WAVE LITHOTRIPSY (ESWL);  Surgeon: Hollice Espy, MD;  Location: [facility];  Service: Urology;  Laterality: Left;   EYE SURGERY     bilateral cataract   IRRIGATION AND DEBRIDEMENT ABSCESS Right 03/24/2019   Procedure: IRRIGATION AND DEBRIDEMENT ABSCESS RIGHT BREAST;  Surgeon: Herbert Pun, MD;  Location: [facility];  Service: General;  Laterality: Right;   LAPAROSCOPIC OOPHERECTOMY Left    PARTIAL MASTECTOMY WITH NEEDLE LOCALIZATION  Right 01/22/2019   Procedure: PARTIAL MASTECTOMY WITH NEEDLE LOCALIZATION;  Surgeon: Herbert Pun, MD;  Location: [facility];  Service: General;  Laterality: Right;   TONSILLECTOMY     TUBAL LIGATION     URETEROSCOPY WITH HOLMIUM LASER LITHOTRIPSY Left 04/14/2015   Procedure: URETEROSCOPY WITH HOLMIUM LASER LITHOTRIPSY;  Surgeon: Hollice Espy, MD;  Location: [facility];  Service: Urology;  Laterality: Left;    Allergies: Allergies as of 05/16/2022 - Review Complete 05/16/2022  Allergen Reaction Noted   Amoxicillin Nausea And Vomiting 12/21/2021    Medications: Outpatient Encounter Medications as of 05/16/2022  Medication Sig   Aflibercept (EYLEA) 2 MG/0.05ML SOLN    aspirin EC 81 MG tablet Take 81 mg by mouth daily.    cholecalciferol (VITAMIN D) 1000 UNITS tablet Take 2,000 Units by mouth daily.   doxycycline (MONODOX) 100 MG capsule Take 100 mg by mouth 2 (two) times daily.   Dulaglutide (TRULICITY) 1.5 WP/8.0DX SOPN Inject 1.5 mg into the skin once a week.   Ferrous Gluconate 324 (37.5 Fe) MG TABS Take 324 mg by mouth daily.   furosemide (LASIX) 20 MG tablet Take 2 tablets by mouth in the AM and take 1 tablet by mouth in the PM. May add 1 tablet to afternoon dose if swelling not improved.   insulin aspart (NOVOLOG FLEXPEN) 100 UNIT/ML FlexPen INJECT [place] 3 times daily with meals AS DIRECTED PER SLIDING SCALE. MAX DAILY DOSE IS 30 UNITS. DX E11.65.   insulin aspart (NOVOLOG FLEXPEN) 100 UNIT/ML FlexPen INJECT UNDER THE SKIN TWICE DAILY AS DIRECTED PER SLIDING SCALE. MAX DAILY DOSE IS 30 UNITS.   insulin detemir (LEVEMIR FLEXPEN) 100 UNIT/ML FlexPen INJECT UP TO 75 UNITS UNDER THE SKIN DAILY, TITRATE AS NEEDED. (Patient taking differently: 25 Units. INJECT UP TO 75 UNITS UNDER THE SKIN DAILY, TITRATE AS NEEDED.)   Lactobacillus (PROBIOTIC ACIDOPHILUS) CAPS Take 1 capsule by mouth daily.   Multiple Vitamin (MULTIVITAMIN) tablet Take 1 tablet by mouth daily at 12 noon.   pantoprazole  (PROTONIX) 40 MG tablet TAKE 1 TABLET BY MOUTH EVERY DAY   pregabalin (LYRICA) 100 MG capsule TAKE 1 CAPSULE BY MOUTH TWICE A DAY   rosuvastatin (CRESTOR) 20 MG tablet TAKE 1 TABLET BY MOUTH EVERYDAY AT BEDTIME   tamoxifen (NOLVADEX) 20 MG tablet Take 1 tablet (20 mg total) by mouth daily.   traMADol-acetaminophen (ULTRACET) 37.5-325 MG tablet Take 1 tablet by mouth every 6 (six) hours as needed for moderate pain or severe pain.   No facility-administered encounter medications on file as of 05/16/2022.    Social History: Social History   Tobacco Use   Smoking status: Former    Packs/day: 1.00    Years: 30.00    Total pack years: 30.00    Types: Cigarettes    Quit date: 04/11/1995    Years since quitting: 27.1   Smokeless tobacco: Never  Vaping Use   Vaping Use: Never used  Substance Use Topics   Alcohol use: No    Alcohol/week: 0.0 standard drinks of alcohol   Drug use: No    Family Medical History: Family History  Problem Relation Age of Onset   Breast cancer Maternal Grandmother 58   Colon cancer Mother    Lung cancer Father     Physical Examination: Vitals:   05/16/22 1357  BP: (!) 158/80      Awake, alert, oriented to person, place, and time.  Speech is clear and fluent. Fund of knowledge is appropriate.   She is teary due to pain.   Cranial Nerves: Pupils equal round and reactive to light.  Facial tone is symmetric.    No lower lumbar tenderness.   No abnormal lesions on exposed skin.   Strength: Side Biceps Triceps Deltoid Interossei Grip Wrist Ext. Wrist Flex.  R '5 5 5 5 5 5 5  '$ L '5 5 5 5 5 5 5   '$ Side Iliopsoas Quads Hamstring PF DF EHL  R '5 5 5 5 5 5  '$ L 4+ 4+ 4+ '5 5 5   '$ She has pain with above strength testing in both upper and lower extremities.  Reflexes are 2+ and symmetric at the biceps, triceps, brachioradialis, patella and achilles.   Hoffman's is absent.  Clonus is not present.   Bilateral upper and lower extremity sensation is intact to  light touch, except she has diminished sensation from her ankles down into her feet.   She ambulates slowly with a cane.   Medical Decision Making  Imaging: No updated lumbar imaging.    Assessment and Plan: Ms. Hurlbutt is a pleasant 77 y.o. female with constant LBP with posterior bilateral leg pain, left > right, to her feet that has become constant. LBP > leg pain.   She has known slip at L4-L5 and dynamic slip at L5-S1 along with severe DDD L3-L5. She has moderate central stenosis L3-L4 and severe central stenosis L4-L5. She continues with some mild weakness in left leg some of which may be pain mediated.   Treatment options discussed with patient and following plan made:   - No improvement with time, HHPT, injections, or medications.  - She is ready to discuss possible surgery options.  - Recommend bone density exam prior to surgical discussion. This was ordered to be done at [facility].  - Of note her last HgbA1c on 05/03/22 was 7.2.  - Follow up with Dr. Izora Ribas in 4 weeks to discuss surgery options. If bone density is done soon, she will call to see if this appointment can be moved up.   BP was slightly elevated, it was 158/80. She checks BP at home and it generally runs 130/70-80. Will recheck at home and call PCP if any issues.   I spent a total of 20 minutes in face-to-face and non-face-to-face activities related to this patient's care toda including review of outside records, review of imaging, review of symptoms, physical exam, discussion of differential diagnosis, discussion of treatment options, and documentation.   Geronimo Boot PA-C Dept. of Neurosurgery

## 2022-05-16 ENCOUNTER — Encounter: Payer: Self-pay | Admitting: Orthopedic Surgery

## 2022-05-16 ENCOUNTER — Ambulatory Visit: Payer: Medicare HMO | Admitting: Orthopedic Surgery

## 2022-05-16 VITALS — BP 158/80 | Ht 64.0 in | Wt 170.0 lb

## 2022-05-16 DIAGNOSIS — M5416 Radiculopathy, lumbar region: Secondary | ICD-10-CM | POA: Diagnosis not present

## 2022-05-16 DIAGNOSIS — M48061 Spinal stenosis, lumbar region without neurogenic claudication: Secondary | ICD-10-CM | POA: Diagnosis not present

## 2022-05-16 DIAGNOSIS — M4316 Spondylolisthesis, lumbar region: Secondary | ICD-10-CM | POA: Diagnosis not present

## 2022-05-16 NOTE — Patient Instructions (Signed)
It was so nice to see you today. Thank you so much for coming in.    I am sorry you are hurting so much.   I want you to get a bone density scan at [facility].   I want you to follow up with Dr. Izora Ribas to discuss surgery options.   Please do not hesitate to call if you have any questions or concerns. You can also message me in [place].   If you have not heard back about any of the above things in the next week, please call the office so we can help you get them scheduled.   Geronimo Boot PA-C [phone]

## 2022-05-23 DIAGNOSIS — M810 Age-related osteoporosis without current pathological fracture: Secondary | ICD-10-CM | POA: Diagnosis not present

## 2022-05-29 ENCOUNTER — Telehealth: Payer: Self-pay

## 2022-05-29 NOTE — Progress Notes (Unsigned)
Telephone Visit- Progress Note: Referring Physician:  No referring provider defined for this encounter.  Primary Physician:  Jonetta Osgood, NP  This visit was performed via telephone.  Patient location: home Provider location: office  I spent a total of 10 minutes non-face-to-face activities for this visit on the date of this encounter including review of current clinical condition and response to treatment.    Patient has given verbal consent to this telephone visits and we reviewed the limitations of a telephone visit. Patient wishes to proceed.    Chief Complaint:  review bone density results  History of Present Illness: Kara [name] is a 77 y.o. female has a history of a history of breast CA, CKD, DM, HTN.   She has known slip at L4-L5 and dynamic slip at L5-S1 along with severe DDD L3-L5. She has moderate central stenosis L3-L4 and severe central stenosis L4-L5. She had some mild weakness in left leg some of which may be pain mediated.   She has follow up with Dr. Izora Ribas to discuss possible surgery options. We just received her HHPT notes.   Phone visit set up to review her dexa bone density results.   No change in her symptoms. She has constant LBP with posterior bilateral leg pain, left > right, to her feet that has become constant. LBP > leg pain.      Exam: No exam done as this was a telephone encounter.     Imaging: Bone density DEXA scan dated 05/23/22:  SITE DATE BMD g/cm   T-SCORE Z-SCORE g/cm  CHANGE % CHANGE  STATISTICALLY               SIGNIFICANT?  LUMBAR  SPINE L1- L4 05-23-22 0.937 -1.0                HIP L.FEM. NECK                    L.TOTAL HIP                   FOREARM  L/R 33% 05-23-22 0.531 -2.7                 INTERPRETATION:  Osteoporosis. Could not get patient to stay on table to  get hips.     FRACTURE RISK ASSESSMENT (FRAX)  _____  10 year risk of hip fracture.      _____  10 year risk of any major  fracture.    TREATMENT:   The National Osteoporosis Foundation (2014) Treatment Guidelines for:  Consider FDA-approved medical therapies in postmenopausal women and men  aged 7 years and older, based on the following:  A hip or vertebral (clinical or morphometric) fracture  T-score = -2.5 at the femoral neck or spine after appropriate evaluation  to exclude secondary causes  Low bone mass (T-score between -1.0 and -2.5 at the femoral neck or spine)  and a 10-year probability of a hip fracture = 3% or a 10-year probability  of a major osteoporosis-related fracture = 20% based on the UA-adapted WHO  algorithm  Clinicians judgment and/or patient preferences may indicate treatment for  people with 10-year fracture probabilities above or below these levels  BMD should be monitored two years after initiating therapy and at two-year  intervals thereafter.   _______________________________________  Kara [name], M.D.            Assessment and Plan: [name] is a pleasant 77 y.o. female with constant LBP  with posterior bilateral leg pain, left > right, to her feet that has become constant. LBP > leg pain.    She has known slip at L4-L5 and dynamic slip at L5-S1 along with severe DDD L3-L5. She has moderate central stenosis L3-L4 and severe central stenosis L4-L5. She continues with some mild weakness in left leg some of which may be pain mediated.   Treatment options discussed with patient and following plan made:   - Reviewed dexa results. She has osteoporosis with T score of -2.7.  - Message to PCP to start her on treatment for osteoporosis. May consider referral to endocrine if needed.  - She will keep her follow up with Dr. Izora Ribas in 2 weeks.   Kara Boot PA-C Neurosurgery

## 2022-05-29 NOTE — Telephone Encounter (Signed)
If you can get more information from her, I'm happy to help.   If not, please set up phone visit with me.   Chong Sicilian is still working on getting her HHPT notes.

## 2022-05-29 NOTE — Telephone Encounter (Signed)
-----   Message from Peggyann Shoals sent at 05/29/2022  8:15 AM EST ----- Regarding: questions Contact: [phone] Patient called asking to speak directly with Marzetta Board about her up coming appt with Dr.Yarbrough on 2/22. I tried getting more details but she did not want to and just asked to speak to [place] again.

## 2022-05-29 NOTE — Telephone Encounter (Signed)
Do you want [place] to set up a telephone visit for her?

## 2022-05-29 NOTE — Telephone Encounter (Signed)
Please schedule a telephone visit.

## 2022-05-29 NOTE — Telephone Encounter (Signed)
I requested the notes from [facility] again.  She wants to make sure that we did receive her bone scan results. I told that we did and Dr.Yarbrough would review them with her at her appt. She said that if she could get a call with the results before her appt because she is very anxious for the results. She is scheduled with you tomorrow for a phone visit at 2:30apm.

## 2022-05-30 ENCOUNTER — Encounter: Payer: Self-pay | Admitting: Orthopedic Surgery

## 2022-05-30 ENCOUNTER — Ambulatory Visit (INDEPENDENT_AMBULATORY_CARE_PROVIDER_SITE_OTHER): Payer: Medicare HMO | Admitting: Orthopedic Surgery

## 2022-05-30 DIAGNOSIS — M48061 Spinal stenosis, lumbar region without neurogenic claudication: Secondary | ICD-10-CM | POA: Diagnosis not present

## 2022-05-30 DIAGNOSIS — M4316 Spondylolisthesis, lumbar region: Secondary | ICD-10-CM

## 2022-05-30 DIAGNOSIS — M810 Age-related osteoporosis without current pathological fracture: Secondary | ICD-10-CM | POA: Diagnosis not present

## 2022-05-30 DIAGNOSIS — M5416 Radiculopathy, lumbar region: Secondary | ICD-10-CM

## 2022-06-05 ENCOUNTER — Telehealth: Payer: Medicare HMO | Admitting: Nurse Practitioner

## 2022-06-05 ENCOUNTER — Encounter: Payer: Self-pay | Admitting: Nurse Practitioner

## 2022-06-05 VITALS — Resp 16 | Ht 64.0 in | Wt 172.0 lb

## 2022-06-05 DIAGNOSIS — M4316 Spondylolisthesis, lumbar region: Secondary | ICD-10-CM

## 2022-06-05 DIAGNOSIS — M4800 Spinal stenosis, site unspecified: Secondary | ICD-10-CM | POA: Diagnosis not present

## 2022-06-05 DIAGNOSIS — M810 Age-related osteoporosis without current pathological fracture: Secondary | ICD-10-CM

## 2022-06-05 DIAGNOSIS — G834 Cauda equina syndrome: Secondary | ICD-10-CM | POA: Diagnosis not present

## 2022-06-05 MED ORDER — ALENDRONATE SODIUM 70 MG PO TABS: 70.0000 mg | ORAL_TABLET | ORAL | 3 refills | Status: DC

## 2022-06-05 MED ORDER — METHOCARBAMOL 750 MG PO TABS: 750.0000 mg | ORAL_TABLET | Freq: Four times a day (QID) | ORAL | 2 refills | Status: DC | PRN

## 2022-06-05 MED ORDER — OXYCODONE-ACETAMINOPHEN 5-325 MG PO TABS: 1.0000 | ORAL_TABLET | Freq: Three times a day (TID) | ORAL | 0 refills | Status: DC | PRN

## 2022-06-05 NOTE — Progress Notes (Signed)
[facility] [address] [place], [place] [address]  Internal MEDICINE  Telephone Visit  Patient Name: Kara [name]  [id number]  [id number]  Date of Service: 06/05/2022  I connected with the patient at 0855 by telephone and verified the patients identity using two identifiers.   I discussed the limitations, risks, security and privacy concerns of performing an evaluation and management service by telephone and the availability of in person appointments. I also discussed with the patient that there may be a patient responsible charge related to the service.  The patient expressed understanding and agrees to proceed.    Chief Complaint  Patient presents with   Telephone Screen    Pain, hardly walk.  [phone]   Telephone Assessment    HPI Kara [name] presents for a telehealth virtual visit for pain and inability to walk.  Having a lot of pain from pinched nerve in spine  Bone density -- t score is -2.7 in left forearm, wants to start medication.  Cauda-equina syndrome -- waiting to see neurosurgery, Dr. Cari Caraway. Not sure if he will be able to do surgery.  Need to start calcium/vitamin D supplement. Wants to get started on biphosphonates.  Ran out of her muscle relaxer.    Current Medication: Outpatient Encounter Medications as of 06/05/2022  Medication Sig Note   Aflibercept (EYLEA) 2 MG/0.05ML SOLN  12/22/2021: Every 3 months   alendronate (FOSAMAX) 70 MG tablet Take 1 tablet (70 mg total) by mouth once a week. Take with a full glass of water on an empty stomach.    aspirin EC 81 MG tablet Take 81 mg by mouth daily.     cholecalciferol (VITAMIN D) 1000 UNITS tablet Take 2,000 Units by mouth daily.    doxycycline (MONODOX) 100 MG capsule Take 100 mg by mouth 2 (two) times daily.    Dulaglutide (TRULICITY) 1.5 0000000 SOPN Inject 1.5 mg into the skin once a week.    Ferrous Gluconate 324 (37.5 Fe) MG TABS Take 324 mg by mouth daily.    furosemide (LASIX) 20 MG tablet  Take 2 tablets by mouth in the AM and take 1 tablet by mouth in the PM. May add 1 tablet to afternoon dose if swelling not improved.    insulin aspart (NOVOLOG FLEXPEN) 100 UNIT/ML FlexPen INJECT [place] 3 times daily with meals AS DIRECTED PER SLIDING SCALE. MAX DAILY DOSE IS 30 UNITS. DX E11.65.    insulin aspart (NOVOLOG FLEXPEN) 100 UNIT/ML FlexPen INJECT UNDER THE SKIN TWICE DAILY AS DIRECTED PER SLIDING SCALE. MAX DAILY DOSE IS 30 UNITS.    insulin detemir (LEVEMIR FLEXPEN) 100 UNIT/ML FlexPen INJECT UP TO 75 UNITS UNDER THE SKIN DAILY, TITRATE AS NEEDED. (Patient taking differently: 25 Units. INJECT UP TO 75 UNITS UNDER THE SKIN DAILY, TITRATE AS NEEDED.) 03/27/2022: 20 units   Lactobacillus (PROBIOTIC ACIDOPHILUS) CAPS Take 1 capsule by mouth daily.    methocarbamol (ROBAXIN) 750 MG tablet Take 1 tablet (750 mg total) by mouth every 6 (six) hours as needed for muscle spasms.    Multiple Vitamin (MULTIVITAMIN) tablet Take 1 tablet by mouth daily at 12 noon.    oxyCODONE-acetaminophen (PERCOCET/ROXICET) 5-325 MG tablet Take 1 tablet by mouth every 8 (eight) hours as needed for severe pain.    pantoprazole (PROTONIX) 40 MG tablet TAKE 1 TABLET BY MOUTH EVERY DAY    pregabalin (LYRICA) 100 MG capsule TAKE 1 CAPSULE BY MOUTH TWICE A DAY    rosuvastatin (CRESTOR) 20 MG tablet TAKE 1 TABLET  BY MOUTH EVERYDAY AT BEDTIME    tamoxifen (NOLVADEX) 20 MG tablet Take 1 tablet (20 mg total) by mouth daily.    [DISCONTINUED] traMADol-acetaminophen (ULTRACET) 37.5-325 MG tablet Take 1 tablet by mouth every 6 (six) hours as needed for moderate pain or severe pain.    No facility-administered encounter medications on file as of 06/05/2022.    Surgical History: Past Surgical History:  Procedure Laterality Date   BILATERAL SALPINGECTOMY     BREAST BIOPSY Right 2014   neg- core   BREAST BIOPSY Right 12/21/2018   [place] bx, venus clip,  DUCTAL CARCINOMA IN SITU   BREAST BIOPSY Right 12/21/2018   [place] bx, ribbon clip,   FIBROEPITHELIAL PROLIFERATION WITH SCLEROSIS   BREAST BIOPSY Right 01/01/2019   Affirm bx "X" clip-path pending   BREAST DUCTAL SYSTEM EXCISION     BREAST EXCISIONAL BIOPSY Right 2014   neg   BREAST LUMPECTOMY Right 01/22/2019   path pending   CATARACT EXTRACTION     CHOLECYSTECTOMY     CYSTOSCOPY WITH STENT PLACEMENT Left 04/14/2015   Procedure: CYSTOSCOPY WITH STENT PLACEMENT;  Surgeon: Hollice Espy, MD;  Location: [facility];  Service: Urology;  Laterality: Left;   EXTRACORPOREAL SHOCK WAVE LITHOTRIPSY Left 09/29/2014   Procedure: EXTRACORPOREAL SHOCK WAVE LITHOTRIPSY (ESWL);  Surgeon: Hollice Espy, MD;  Location: [facility];  Service: Urology;  Laterality: Left;   EYE SURGERY     bilateral cataract   IRRIGATION AND DEBRIDEMENT ABSCESS Right 03/24/2019   Procedure: IRRIGATION AND DEBRIDEMENT ABSCESS RIGHT BREAST;  Surgeon: Herbert Pun, MD;  Location: [facility];  Service: General;  Laterality: Right;   LAPAROSCOPIC OOPHERECTOMY Left    PARTIAL MASTECTOMY WITH NEEDLE LOCALIZATION Right 01/22/2019   Procedure: PARTIAL MASTECTOMY WITH NEEDLE LOCALIZATION;  Surgeon: Herbert Pun, MD;  Location: [facility];  Service: General;  Laterality: Right;   TONSILLECTOMY     TUBAL LIGATION     URETEROSCOPY WITH HOLMIUM LASER LITHOTRIPSY Left 04/14/2015   Procedure: URETEROSCOPY WITH HOLMIUM LASER LITHOTRIPSY;  Surgeon: Hollice Espy, MD;  Location: [facility];  Service: Urology;  Laterality: Left;    Medical History: Past Medical History:  Diagnosis Date   Anemia    Arthropathy    Benign neoplasm of breast    Breast cancer ([place])    Chest pain    Chronic kidney disease    30% kidney function   Diabetes ([place])    Diabetic retinopathy ([place])    Dyspnea    Edema    History of kidney stones    Hyperlipemia    Hypersomnia with sleep apnea    Hypertension    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HCC)    Lumbago    Osteoarthrosis    Ovarian failure    Personal history of radiation  therapy    Pneumonia 12/21/2021   covid with pneumonia( hospitalization)   SOB (shortness of breath)     Family History: Family History  Problem Relation Age of Onset   Breast cancer Maternal Grandmother 88   Colon cancer Mother    Lung cancer Father     Social History   Socioeconomic History   Marital status: Married    Spouse name: Not on file   Number of children: Not on file   Years of education: Not on file   Highest education level: Not on file  Occupational History   Not on file  Tobacco Use   Smoking status: Former    Packs/day: 1.00    Years: 30.00  Total pack years: 30.00    Types: Cigarettes    Quit date: 04/11/1995    Years since quitting: 27.1   Smokeless tobacco: Never  Vaping Use   Vaping Use: Never used  Substance and Sexual Activity   Alcohol use: No    Alcohol/week: 0.0 standard drinks of alcohol   Drug use: No   Sexual activity: Not on file  Other Topics Concern   Not on file  Social History Narrative   Not on file   Social Determinants of Health   Financial Resource Strain: Low Risk  (10/03/2020)   Overall Financial Resource Strain (CARDIA)    Difficulty of Paying Living Expenses: Not very hard  Food Insecurity: Not on file  Transportation Needs: Not on file  Physical Activity: Not on file  Stress: Not on file  Social Connections: Not on file  Intimate Partner Violence: Not on file      Review of Systems  Constitutional:  Positive for fatigue. Negative for appetite change, chills and fever.  HENT: Negative.    Respiratory: Negative.  Negative for cough, chest tightness, shortness of breath and wheezing.   Cardiovascular: Negative.  Negative for chest pain and palpitations.  Gastrointestinal: Negative.   Genitourinary: Negative.   Musculoskeletal:  Positive for arthralgias, back pain, gait problem and myalgias.    Vital Signs: Resp 16   Ht 5' 4"$  (1.626 m)   Wt 172 lb (78 kg)   BMI 29.52 kg/m     Observation/Objective: She is alert and oriented and engages in conversation appropriately. No acute distress noted.     Assessment/Plan: 1. Central stenosis of spinal canal Pain medication and muscle relaxer prescribed at an increased dose.  - methocarbamol (ROBAXIN) 750 MG tablet; Take 1 tablet (750 mg total) by mouth every 6 (six) hours as needed for muscle spasms.  Dispense: 120 tablet; Refill: 2 - oxyCODONE-acetaminophen (PERCOCET/ROXICET) 5-325 MG tablet; Take 1 tablet by mouth every 8 (eight) hours as needed for severe pain.  Dispense: 15 tablet; Refill: 0  2. Anterolisthesis of lumbar spine Pain medication and increased dose of muscle relaxer prescribed.  - methocarbamol (ROBAXIN) 750 MG tablet; Take 1 tablet (750 mg total) by mouth every 6 (six) hours as needed for muscle spasms.  Dispense: 120 tablet; Refill: 2 - oxyCODONE-acetaminophen (PERCOCET/ROXICET) 5-325 MG tablet; Take 1 tablet by mouth every 8 (eight) hours as needed for severe pain.  Dispense: 15 tablet; Refill: 0  3. Cauda equina syndrome (HCC) Pain medication changed and muscle relaxer prescribed . - methocarbamol (ROBAXIN) 750 MG tablet; Take 1 tablet (750 mg total) by mouth every 6 (six) hours as needed for muscle spasms.  Dispense: 120 tablet; Refill: 2 - oxyCODONE-acetaminophen (PERCOCET/ROXICET) 5-325 MG tablet; Take 1 tablet by mouth every 8 (eight) hours as needed for severe pain.  Dispense: 15 tablet; Refill: 0  4. Age-related osteoporosis without current pathological fracture Start weekly alendronate.  - alendronate (FOSAMAX) 70 MG tablet; Take 1 tablet (70 mg total) by mouth once a week. Take with a full glass of water on an empty stomach.  Dispense: 12 tablet; Refill: 3   General Counseling: Arley verbalizes understanding of the findings of today's phone visit and agrees with plan of treatment. I have discussed any further diagnostic evaluation that may be needed or ordered today. We also reviewed her  medications today. she has been encouraged to call the office with any questions or concerns that should arise related to todays visit.  Return if symptoms worsen or  fail to improve.   No orders of the defined types were placed in this encounter.   Meds ordered this encounter  Medications   alendronate (FOSAMAX) 70 MG tablet    Sig: Take 1 tablet (70 mg total) by mouth once a week. Take with a full glass of water on an empty stomach.    Dispense:  12 tablet    Refill:  3    Please fill ASAP   methocarbamol (ROBAXIN) 750 MG tablet    Sig: Take 1 tablet (750 mg total) by mouth every 6 (six) hours as needed for muscle spasms.    Dispense:  120 tablet    Refill:  2    Fill new script today, ASAP   oxyCODONE-acetaminophen (PERCOCET/ROXICET) 5-325 MG tablet    Sig: Take 1 tablet by mouth every 8 (eight) hours as needed for severe pain.    Dispense:  15 tablet    Refill:  0    Initial prescription, please fill ASAP    Time spent:10 Minutes Time spent with patient included reviewing progress notes, labs, imaging studies, and discussing plan for follow up.  [place] Controlled Substance Database was reviewed by me for overdose risk score (ORS) if appropriate.  This patient was seen by Jonetta Osgood, FNP-C in collaboration with Dr. Clayborn Bigness as a part of collaborative care agreement.  Roxy Mastandrea R. Valetta Fuller, MSN, FNP-C Internal medicine

## 2022-06-10 ENCOUNTER — Telehealth: Payer: Self-pay

## 2022-06-10 DIAGNOSIS — M4800 Spinal stenosis, site unspecified: Secondary | ICD-10-CM

## 2022-06-10 DIAGNOSIS — G834 Cauda equina syndrome: Secondary | ICD-10-CM

## 2022-06-10 DIAGNOSIS — M4316 Spondylolisthesis, lumbar region: Secondary | ICD-10-CM

## 2022-06-11 ENCOUNTER — Ambulatory Visit: Payer: Medicare HMO | Admitting: Dermatology

## 2022-06-11 MED ORDER — OXYCODONE-ACETAMINOPHEN 5-325 MG PO TABS: 1.0000 | ORAL_TABLET | Freq: Two times a day (BID) | ORAL | 0 refills | Status: DC | PRN

## 2022-06-11 NOTE — Telephone Encounter (Signed)
done 

## 2022-06-12 NOTE — Progress Notes (Unsigned)
Referring Physician:  Jonetta Osgood, NP [place],  Kara [name] [id number]  Primary Physician:  Kara Osgood, NP  History of Present Illness: 06/13/2022 [name] is here today with a chief complaint of low back pain with left worse than right leg pain.  Her pain goes down her buttock and posterior leg to her big toe and fifth toe.  She sometimes gets right posterior leg pain in her posterior thigh.  She sometimes gets throbbing pain in her vaginal area.  She gets excruciating pain in her rectum right before she has a bowel movement.  She has been having worsening symptoms over the past 4 months.  Walking, sitting, standing, and laying down make it worse.  She can only walk approximately 20 feet before she has severe pain. Bowel/Bladder Dysfunction: none  Conservative measures:  Physical therapy:  has participated in 11 visits of home health PT with [facility] and was discharged.  Multimodal medical therapy including regular antiinflammatories:  tramadol, lyrica, oxycodone, robaxin Injections:  has received epidural steroid injections 04/02/22: Left L3-4 IL ESI by Dr Kara [name] - 50% relief for 3-4 days 04/23/22: left hip injection by Dr Kara [name] - no relief  Past Surgery:  denies  Kara [name] has no symptoms of cervical myelopathy.  The symptoms are causing a significant impact on the patient's life.   I have utilized the care everywhere function in epic to review the outside records available from external health systems.  Progress Note from Kara [name], [place] on 05/30/22:  History of Present Illness: Kara [name] is a 77 y.o. female has a history of a history of breast CA, CKD, DM, HTN.   She has known slip at L4-L5 and dynamic slip at L5-S1 along with severe DDD L3-L5. She has moderate central stenosis L3-L4 and severe central stenosis L4-L5. She had some mild weakness in left leg some of which may be pain mediated.    She has follow up with Dr. Izora [name] to  discuss possible surgery options. We just received her HHPT notes.    Phone visit set up to review her dexa bone density results.    No change in her symptoms. She has constant LBP with posterior bilateral leg pain, left > right, to her feet that has become constant. LBP > leg pain.   Review of Systems:  A 10 point review of systems is negative, except for the pertinent positives and negatives detailed in the HPI.  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[place])    Lumbago    Osteoarthrosis    Ovarian failure    Personal history of radiation therapy    Pneumonia 12/21/2021   covid with pneumonia( hospitalization)   SOB (shortness of breath)     Past Surgical History: Past Surgical History:  Procedure Laterality Date   BILATERAL SALPINGECTOMY     BREAST BIOPSY Right 2014   neg- core   BREAST BIOPSY Right 12/21/2018   [place] bx, venus clip,  DUCTAL  CARCINOMA IN SITU   BREAST BIOPSY Right 12/21/2018   [place] bx, ribbon clip,  FIBROEPITHELIAL PROLIFERATION WITH SCLEROSIS   BREAST BIOPSY Right 01/01/2019   Affirm bx "X" clip-path pending   BREAST DUCTAL SYSTEM EXCISION     BREAST EXCISIONAL BIOPSY Right 2014   neg   BREAST LUMPECTOMY Right 01/22/2019   path pending   CATARACT EXTRACTION     CHOLECYSTECTOMY     CYSTOSCOPY WITH STENT PLACEMENT Left 04/14/2015   Procedure: CYSTOSCOPY WITH STENT PLACEMENT;  Surgeon: Kara Espy, MD;  Location: [facility];  Service: Urology;  Laterality: Left;   EXTRACORPOREAL SHOCK WAVE LITHOTRIPSY Left 09/29/2014   Procedure: EXTRACORPOREAL SHOCK WAVE LITHOTRIPSY (ESWL);  Surgeon: Kara Espy, MD;  Location: [facility];  Service: Urology;  Laterality: Left;   EYE SURGERY     bilateral cataract   IRRIGATION AND DEBRIDEMENT ABSCESS Right 03/24/2019   Procedure: IRRIGATION AND DEBRIDEMENT ABSCESS RIGHT BREAST;  Surgeon: Kara Pun, MD;  Location: [facility];  Service: General;  Laterality: Right;   LAPAROSCOPIC OOPHERECTOMY Left    PARTIAL MASTECTOMY WITH NEEDLE LOCALIZATION Right 01/22/2019   Procedure: PARTIAL MASTECTOMY WITH NEEDLE LOCALIZATION;  Surgeon: Kara Pun, MD;  Location: [facility];  Service: General;  Laterality: Right;   TONSILLECTOMY     TUBAL LIGATION     URETEROSCOPY WITH HOLMIUM LASER LITHOTRIPSY Left 04/14/2015   Procedure: URETEROSCOPY WITH HOLMIUM LASER LITHOTRIPSY;  Surgeon: Kara Espy, MD;  Location: [facility];  Service: Urology;  Laterality: Left;    Allergies: Allergies as of 06/13/2022 - Review Complete 06/13/2022  Allergen Reaction Noted   Amoxicillin Nausea And Vomiting 12/21/2021    Medications: Current Meds  Medication Sig   docusate sodium (COLACE) 100 MG capsule Take 200 mg by mouth daily.    Social History: Social History   Tobacco Use   Smoking status: Former    Packs/day: 1.00    Years: 30.00    Total pack years: 30.00    Types: Cigarettes    Quit date: 04/11/1995    Years since quitting: 27.1   Smokeless tobacco: Never  Vaping Use   Vaping Use: Never used  Substance Use Topics   Alcohol use: No    Alcohol/week: 0.0 standard drinks of alcohol   Drug use: No    Family Medical History: Family History  Problem Relation Age of Onset   Breast cancer Maternal Grandmother 73   Colon cancer Mother    Lung cancer Father     Physical Examination: There were no vitals filed for this visit.  General: Patient is well developed, well nourished, calm, collected, and in no apparent distress. Attention to examination is appropriate.  Neck:   Supple.  Full range of motion.  Respiratory: Patient is  breathing without any difficulty.   NEUROLOGICAL:     Awake, alert, oriented to person, place, and time.  Speech is clear and fluent.   Cranial Nerves: Pupils equal round and reactive to light.  Facial tone is symmetric.  Facial sensation is symmetric. Shoulder shrug is symmetric. Tongue protrusion is midline.  There is no pronator drift.  ROM of spine: full.    Strength: Side Biceps Triceps Deltoid Interossei Grip Wrist Ext. Wrist Flex.  R 5 5 5 5 5 5 5  $ L 5 5 5 5 5 5 5   $ Side Iliopsoas Quads Hamstring PF DF EHL  R 5 5 5 5 5 5  $ L 5 5 5 5 5 5   $ Reflexes are 1+ and  symmetric at the biceps, triceps, brachioradialis, patella and achilles.   Hoffman's is absent.   Bilateral upper and lower extremity sensation is intact to light touch except mid calf and below which is diminished.    No evidence of dysmetria noted.  Gait is stooped and very slow.  She has obvious pain..     Medical Decision Making  Imaging: MRI L spine 03/21/2022 MRI of lumbar spine dated 03/12/22:  Severe central spinal canal stenosis with compression of the cauda equina roots at L4-L5 due to 72m apophyseal anterolisthesis, diffuse pseudo bulge of listhesis, ligamentum flavum thickening and facet arthrosis. Superimposed broad disc extrusion type herniation with superior migration. Severe discogenic narrowing of the neural foramina with impingement of the exiting L4 nerve roots.  Moderate central spinal canal stenosis at L3-L4 due to advanced intervertebral osteochrondrosis , total disc collapse and disc osteophyte complex formation that compresses the descending L4 nerve roots. Moderate discogenic narrowing of the neural foramina with focal compression of the exiting L3 nerve roots.  Severe bilateral L5-S1 facet arthrosis with trace right joint effusion. Nominal apophyseal anterolisthesis. Asymmetric psuedo bulge of listhesis. Mild narrowing of the neural foramina abutting the exiting L5 nerve roots.   I have  personally reviewed the images and agree with the above interpretation.  Assessment and Plan: Ms. WLimbaughis a pleasant 77y.o. female with spondylolisthesis at L4-5 with substantial compression at L3-4 and L4-5.  She has severe discogenic disease on her CT scan from last year.  She has substantial facet arthrosis at L3-4 and L4-5.  On her standing x-rays, her anterolisthesis extends to 13 mm while it is 5 mm on supine MRI scan.  As such, she has implied lumbar spinal instability as well.  She has tried and failed conservative management and has substantial impact on her day-to-day life.  She has trouble walking more than 20 feet at this time.  No further conservative management is indicated.  Given the substantial compression of her nerve roots as well as her implied lumbar spinal instability and anterolisthesis, I recommended surgical intervention with L3-5 lateral lumbar interbody fusion with posterior fixation and fusion.  I discussed the planned procedure at length with the patient, including the risks, benefits, alternatives, and indications. The risks discussed include but are not limited to bleeding, infection, need for reoperation, spinal fluid leak, stroke, vision loss, anesthetic complication, coma, paralysis, and even death. I also described the possibility of psoas weakness and paresthesias. I described in detail that improvement was not guaranteed.  The patient expressed understanding of these risks, and asked that we proceed with surgery. I described the surgery in layman's terms, and gave ample opportunity for questions, which were answered to the best of my ability.   Thank you for involving me in the care of this patient.      Ryaan Vanwagoner K. YIzora RibasMD, MEndoscopy Group LLCNeurosurgery

## 2022-06-12 NOTE — H&P (View-Only) (Signed)
Referring Physician:  Jonetta Osgood, NP [place],  [place] [address]  Primary Physician:  Jonetta Osgood, NP  History of Present Illness: 06/13/2022 Kara [name] is here today with a chief complaint of low back pain with left worse than right leg pain.  Her pain goes down her buttock and posterior leg to her big toe and fifth toe.  She sometimes gets right posterior leg pain in her posterior thigh.  She sometimes gets throbbing pain in her vaginal area.  She gets excruciating pain in her rectum right before she has a bowel movement.  She has been having worsening symptoms over the past 4 months.  Walking, sitting, standing, and laying down make it worse.  She can only walk approximately 20 feet before she has severe pain. Bowel/Bladder Dysfunction: none  Conservative measures:  Physical therapy:  has participated in 11 visits of home health PT with [facility] and was discharged.  Multimodal medical therapy including regular antiinflammatories:  tramadol, lyrica, oxycodone, robaxin Injections:  has received epidural steroid injections 04/02/22: Left L3-4 IL ESI by Dr Dossie Arbour - 50% relief for 3-4 days 04/23/22: left hip injection by Dr Dossie Arbour - no relief  Past Surgery:  denies  Kara [name] has no symptoms of cervical myelopathy.  The symptoms are causing a significant impact on the patient's life.   I have utilized the care everywhere function in epic to review the outside records available from external health systems.  Progress Note from Geronimo Boot, [place] on 05/30/22:  History of Present Illness: Kara [name] is a 77 y.o. female has a history of a history of breast CA, CKD, DM, HTN.   She has known slip at L4-L5 and dynamic slip at L5-S1 along with severe DDD L3-L5. She has moderate central stenosis L3-L4 and severe central stenosis L4-L5. She had some mild weakness in left leg some of which may be pain mediated.    She has follow up with Dr. Izora Ribas to  discuss possible surgery options. We just received her HHPT notes.    Phone visit set up to review her dexa bone density results.    No change in her symptoms. She has constant LBP with posterior bilateral leg pain, left > right, to her feet that has become constant. LBP > leg pain.   Review of Systems:  A 10 point review of systems is negative, except for the pertinent positives and negatives detailed in the HPI.  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[place])    Lumbago    Osteoarthrosis    Ovarian failure    Personal history of radiation therapy    Pneumonia 12/21/2021   covid with pneumonia( hospitalization)   SOB (shortness of breath)     Past Surgical History: Past Surgical History:  Procedure Laterality Date   BILATERAL SALPINGECTOMY     BREAST BIOPSY Right 2014   neg- core   BREAST BIOPSY Right 12/21/2018   [place] bx, venus clip,  DUCTAL  CARCINOMA IN SITU   BREAST BIOPSY Right 12/21/2018   [place] bx, ribbon clip,  FIBROEPITHELIAL PROLIFERATION WITH SCLEROSIS   BREAST BIOPSY Right 01/01/2019   Affirm bx "X" clip-path pending   BREAST DUCTAL SYSTEM EXCISION     BREAST EXCISIONAL BIOPSY Right 2014   neg   BREAST LUMPECTOMY Right 01/22/2019   path pending   CATARACT EXTRACTION     CHOLECYSTECTOMY     CYSTOSCOPY WITH STENT PLACEMENT Left 04/14/2015   Procedure: CYSTOSCOPY WITH STENT PLACEMENT;  Surgeon: Hollice Espy, MD;  Location: [facility] OR[facility];  Service: Urology;  Laterality: Left;   EXTRACORPOREAL SHOCK WAVE LITHOTRIPSY Left 09/29/2014   Procedure: EXTRACORPOREAL SHOCK WAVE LITHOTRIPSY (ESWL);  Surgeon: Hollice Espy, MD;  Location: [facility];  Service: Urology;  Laterality: Left;   EYE SURGERY     bilateral cataract   IRRIGATION AND DEBRIDEMENT ABSCESS Right 03/24/2019   Procedure: IRRIGATION AND DEBRIDEMENT ABSCESS RIGHT BREAST;  Surgeon: Herbert Pun, MD;  Location: [facility];  Service: General;  Laterality: Right;   LAPAROSCOPIC OOPHERECTOMY Left    PARTIAL MASTECTOMY WITH NEEDLE LOCALIZATION Right 01/22/2019   Procedure: PARTIAL MASTECTOMY WITH NEEDLE LOCALIZATION;  Surgeon: Herbert Pun, MD;  Location: [facility];  Service: General;  Laterality: Right;   TONSILLECTOMY     TUBAL LIGATION     URETEROSCOPY WITH HOLMIUM LASER LITHOTRIPSY Left 04/14/2015   Procedure: URETEROSCOPY WITH HOLMIUM LASER LITHOTRIPSY;  Surgeon: Hollice Espy, MD;  Location: [facility];  Service: Urology;  Laterality: Left;    Allergies: Allergies as of 06/13/2022 - Review Complete 06/13/2022  Allergen Reaction Noted   Amoxicillin Nausea And Vomiting 12/21/2021    Medications: Current Meds  Medication Sig   docusate sodium (COLACE) 100 MG capsule Take 200 mg by mouth daily.    Social History: Social History   Tobacco Use   Smoking status: Former    Packs/day: 1.00    Years: 30.00    Total pack years: 30.00    Types: Cigarettes    Quit date: 04/11/1995    Years since quitting: 27.1   Smokeless tobacco: Never  Vaping Use   Vaping Use: Never used  Substance Use Topics   Alcohol use: No    Alcohol/week: 0.0 standard drinks of alcohol   Drug use: No    Family Medical History: Family History  Problem Relation Age of Onset   Breast cancer Maternal Grandmother 73   Colon cancer Mother    Lung cancer Father     Physical Examination: There were no vitals filed for this visit.  General: Patient is well developed, well nourished, calm, collected, and in no apparent distress. Attention to examination is appropriate.  Neck:   Supple.  Full range of motion.  Respiratory: Patient is  breathing without any difficulty.   NEUROLOGICAL:     Awake, alert, oriented to person, place, and time.  Speech is clear and fluent.   Cranial Nerves: Pupils equal round and reactive to light.  Facial tone is symmetric.  Facial sensation is symmetric. Shoulder shrug is symmetric. Tongue protrusion is midline.  There is no pronator drift.  ROM of spine: full.    Strength: Side Biceps Triceps Deltoid Interossei Grip Wrist Ext. Wrist Flex.  R 5 5 5 5 5 5 5  $ L 5 5 5 5 5 5 5   $ Side Iliopsoas Quads Hamstring PF DF EHL  R 5 5 5 5 5 5  $ L 5 5 5 5 5 5   $ Reflexes are 1+ and  symmetric at the biceps, triceps, brachioradialis, patella and achilles.   Hoffman's is absent.   Bilateral upper and lower extremity sensation is intact to light touch except mid calf and below which is diminished.    No evidence of dysmetria noted.  Gait is stooped and very slow.  She has obvious pain..     Medical Decision Making  Imaging: MRI L spine 03/21/2022 MRI of lumbar spine dated 03/12/22:  Severe central spinal canal stenosis with compression of the cauda equina roots at L4-L5 due to 72m apophyseal anterolisthesis, diffuse pseudo bulge of listhesis, ligamentum flavum thickening and facet arthrosis. Superimposed broad disc extrusion type herniation with superior migration. Severe discogenic narrowing of the neural foramina with impingement of the exiting L4 nerve roots.  Moderate central spinal canal stenosis at L3-L4 due to advanced intervertebral osteochrondrosis , total disc collapse and disc osteophyte complex formation that compresses the descending L4 nerve roots. Moderate discogenic narrowing of the neural foramina with focal compression of the exiting L3 nerve roots.  Severe bilateral L5-S1 facet arthrosis with trace right joint effusion. Nominal apophyseal anterolisthesis. Asymmetric psuedo bulge of listhesis. Mild narrowing of the neural foramina abutting the exiting L5 nerve roots.   I have  personally reviewed the images and agree with the above interpretation.  Assessment and Plan: Ms. WLimbaughis a pleasant 77y.o. female with spondylolisthesis at L4-5 with substantial compression at L3-4 and L4-5.  She has severe discogenic disease on her CT scan from last year.  She has substantial facet arthrosis at L3-4 and L4-5.  On her standing x-rays, her anterolisthesis extends to 13 mm while it is 5 mm on supine MRI scan.  As such, she has implied lumbar spinal instability as well.  She has tried and failed conservative management and has substantial impact on her day-to-day life.  She has trouble walking more than 20 feet at this time.  No further conservative management is indicated.  Given the substantial compression of her nerve roots as well as her implied lumbar spinal instability and anterolisthesis, I recommended surgical intervention with L3-5 lateral lumbar interbody fusion with posterior fixation and fusion.  I discussed the planned procedure at length with the patient, including the risks, benefits, alternatives, and indications. The risks discussed include but are not limited to bleeding, infection, need for reoperation, spinal fluid leak, stroke, vision loss, anesthetic complication, coma, paralysis, and even death. I also described the possibility of psoas weakness and paresthesias. I described in detail that improvement was not guaranteed.  The patient expressed understanding of these risks, and asked that we proceed with surgery. I described the surgery in layman's terms, and gave ample opportunity for questions, which were answered to the best of my ability.   Thank you for involving me in the care of this patient.      Gabrella Stroh K. YIzora RibasMD, MEnd[facility] LLCNeurosurgery

## 2022-06-13 ENCOUNTER — Ambulatory Visit: Payer: Medicare HMO | Admitting: Neurosurgery

## 2022-06-13 ENCOUNTER — Encounter: Payer: Self-pay | Admitting: Neurosurgery

## 2022-06-13 VITALS — Ht 60.0 in | Wt 181.4 lb

## 2022-06-13 DIAGNOSIS — M5442 Lumbago with sciatica, left side: Secondary | ICD-10-CM | POA: Diagnosis not present

## 2022-06-13 DIAGNOSIS — G8929 Other chronic pain: Secondary | ICD-10-CM | POA: Diagnosis not present

## 2022-06-13 DIAGNOSIS — M4316 Spondylolisthesis, lumbar region: Secondary | ICD-10-CM

## 2022-06-13 DIAGNOSIS — M48062 Spinal stenosis, lumbar region with neurogenic claudication: Secondary | ICD-10-CM

## 2022-06-13 DIAGNOSIS — M532X6 Spinal instabilities, lumbar region: Secondary | ICD-10-CM

## 2022-06-13 NOTE — Patient Instructions (Signed)
Please see below for information in regards to your upcoming surgery:  **we are planning to move our office location mid-March. Please check with [place] to see if we have moved to our new location prior to coming to your post op appointments after surgery. Our phone number will remain the same [phone]). Our new address will be: [facility] [address] [place], [place] [address]  Planned surgery: L3-5 lateral lumbar interbody fusion and posterior spinal fusion   Surgery date: 07/03/22 - you will find out your arrival time the business day before your surgery.   Pre-op appointment at [facility]: we will call you with a date/time for this. Pre-admit testing is located on the first floor of the [facility], [place], [address]. Please bring all prescriptions in the original prescription bottles to your appointment, even if you have reviewed medications by phone with a pharmacy representative. Pre-op labs may be done at your pre-op appointment. You are not required to fast for these labs. Should you need to change your pre-op appointment, please call Pre-admit testing at [phone].    Surgical clearance: we will send a clearance form to Jonetta Osgood, NP     Blood thinners:  Aspirin:  stop aspirin 7 days prior, resume aspirin 14 days after       Dulaglutide (Trulicity) - should be held for 7 days before surgery    NSAIDS (Non-steroidal anti-inflammatory drugs): because you are having a fusion, no NSAIDS (such as ibuprofen, aleve, naproxen, meloxicam, diclofenac) for 3 months after surgery. Celebrex is an exception. Tylenol is ok because it is not an NSAID.    Home health physical therapy: [facility] will contact you regarding home health physical therapy for after surgery.Their number is [phone].    Because you are having a fusion: for appointments after your 2  week follow-up: please arrive at the [facility] outpatient imaging center ([place], [place]) or [facility] one hour prior to your appointment for x-rays. This applies to every appointment after your 2 week follow-up. Failure to do so may result in your appointment being rescheduled.     We can be reached by phone or mychart 8am-4pm, Monday-Friday. If you have any questions/concerns before or after surgery, you can reach [place] at [phone], or you can send a mychart message. If you have a concern after hours that cannot wait until normal business hours, you can call [phone] and ask to page the neurosurgeon on call for [place].     Appointments/FMLA & disability paperwork: [place]  Nurse: Ophelia Shoulder  Medical assistants: Lum Keas Physician Assistant's: [place] Surgeon: Meade Maw, MD

## 2022-06-17 ENCOUNTER — Other Ambulatory Visit: Payer: Self-pay

## 2022-06-17 ENCOUNTER — Other Ambulatory Visit: Payer: Self-pay | Admitting: Dermatology

## 2022-06-17 ENCOUNTER — Other Ambulatory Visit: Payer: Self-pay | Admitting: Oncology

## 2022-06-17 DIAGNOSIS — Z01818 Encounter for other preprocedural examination: Secondary | ICD-10-CM

## 2022-06-17 DIAGNOSIS — D0511 Intraductal carcinoma in situ of right breast: Secondary | ICD-10-CM

## 2022-06-17 DIAGNOSIS — L120 Bullous pemphigoid: Secondary | ICD-10-CM

## 2022-06-24 ENCOUNTER — Other Ambulatory Visit: Payer: Medicare HMO

## 2022-06-24 DIAGNOSIS — H34832 Tributary (branch) retinal vein occlusion, left eye, with macular edema: Secondary | ICD-10-CM | POA: Diagnosis not present

## 2022-06-25 ENCOUNTER — Encounter
Admission: RE | Admit: 2022-06-25 | Discharge: 2022-06-25 | Disposition: A | Discharge status: Home / Self Care | Payer: Medicare HMO | Source: Ambulatory Visit | Attending: Neurosurgery | Admitting: Neurosurgery

## 2022-06-25 ENCOUNTER — Other Ambulatory Visit: Payer: Self-pay

## 2022-06-25 VITALS — BP 159/74 | HR 89 | Resp 18 | Ht 60.0 in | Wt 184.0 lb

## 2022-06-25 DIAGNOSIS — Z01818 Encounter for other preprocedural examination: Secondary | ICD-10-CM

## 2022-06-25 DIAGNOSIS — E669 Obesity, unspecified: Secondary | ICD-10-CM | POA: Diagnosis not present

## 2022-06-25 DIAGNOSIS — Z6830 Body mass index (BMI) 30.0-30.9, adult: Secondary | ICD-10-CM | POA: Insufficient documentation

## 2022-06-25 DIAGNOSIS — D649 Anemia, unspecified: Secondary | ICD-10-CM | POA: Diagnosis not present

## 2022-06-25 DIAGNOSIS — Z794 Long term (current) use of insulin: Secondary | ICD-10-CM

## 2022-06-25 DIAGNOSIS — E1165 Type 2 diabetes mellitus with hyperglycemia: Secondary | ICD-10-CM | POA: Diagnosis not present

## 2022-06-25 HISTORY — DX: Chronic kidney disease, stage 4 (severe): N18.4

## 2022-06-25 HISTORY — DX: Polyneuropathy, unspecified: G62.9

## 2022-06-25 LAB — URINALYSIS, ROUTINE W REFLEX MICROSCOPIC
Bilirubin Urine: NEGATIVE
Glucose, UA: NEGATIVE mg/dL
Hgb urine dipstick: NEGATIVE
Ketones, ur: NEGATIVE mg/dL
Leukocytes,Ua: NEGATIVE
Nitrite: NEGATIVE
Protein, ur: NEGATIVE mg/dL
Specific Gravity, Urine: 1.014 (ref 1.005–1.030)
pH: 5 (ref 5.0–8.0)

## 2022-06-25 LAB — CBC
HCT: 34.4 % — ABNORMAL LOW (ref 36.0–46.0)
Hemoglobin: 10.8 g/dL — ABNORMAL LOW (ref 12.0–15.0)
MCH: 27.6 pg (ref 26.0–34.0)
MCHC: 31.4 g/dL (ref 30.0–36.0)
MCV: 88 fL (ref 80.0–100.0)
Platelets: 167 10*3/uL (ref 150–400)
RBC: 3.91 MIL/uL (ref 3.87–5.11)
RDW: 15.7 % — ABNORMAL HIGH (ref 11.5–15.5)
WBC: 6.4 10*3/uL (ref 4.0–10.5)
nRBC: 0 % (ref 0.0–0.2)

## 2022-06-25 LAB — TYPE AND SCREEN
ABO/RH(D): A POS
Antibody Screen: NEGATIVE

## 2022-06-25 LAB — BASIC METABOLIC PANEL
Anion gap: 8 (ref 5–15)
BUN: 28 mg/dL — ABNORMAL HIGH (ref 8–23)
CO2: 25 mmol/L (ref 22–32)
Calcium: 8.6 mg/dL — ABNORMAL LOW (ref 8.9–10.3)
Chloride: 107 mmol/L (ref 98–111)
Creatinine, Ser: 1.26 mg/dL — ABNORMAL HIGH (ref 0.44–1.00)
GFR, Estimated: 45 mL/min — ABNORMAL LOW (ref >60)
Glucose, Bld: 134 mg/dL — ABNORMAL HIGH (ref 70–99)
Potassium: 3.7 mmol/L (ref 3.5–5.1)
Sodium: 140 mmol/L (ref 135–145)

## 2022-06-25 LAB — SURGICAL PCR SCREEN
MRSA, PCR: NEGATIVE
Staphylococcus aureus: NEGATIVE

## 2022-06-25 NOTE — Patient Instructions (Signed)
Your procedure is scheduled on: Wednesday 07/03/22 To find out your arrival time, please call [phone] between [place] on:   Tuesday 07/02/22 Report to the Registration Desk on the 1st floor of the [facility]. Valet parking is available.  If your arrival time is 6:00 am, do not arrive before that time as the [facility] entrance doors do not open until 6:00 am.  REMEMBER: Instructions that are not followed completely may result in serious medical risk, up to and including death; or upon the discretion of your surgeon and anesthesiologist your surgery may need to be rescheduled.  Do not eat food after midnight the night before surgery.  No gum chewing or hard candies.  You may however, drink CLEAR liquids up to 2 hours before you are scheduled to arrive for your surgery. Do not drink anything within 2 hours of your scheduled arrival time.  Clear liquids include: - water   Do NOT drink anything that is not on this list.  Type 1 and Type 2 diabetics should only drink water.  One week prior to surgery: Stop Anti-inflammatories (NSAIDS) such as Advil, Aleve, Ibuprofen, Motrin, Naproxen, Naprosyn and Aspirin based products such as Excedrin, Goody's Powder, BC Powder. You may however, continue to take Tylenol if needed for pain up until the day of surgery.  Stop ANY OVER THE COUNTER supplements until after surgery.  Continue taking all prescribed medications with the exception of the following: Trulicity, Aspirin  **Follow guidelines for insulin and diabetes medications** Decrease your Levemir to 12 or 13 units the night before your surgery. Do not take any insulin the morning of your surgery.  Follow recommendations from Cardiologist or PCP regarding stopping blood thinners.  TAKE ONLY THESE MEDICATIONS THE MORNING OF SURGERY WITH A SIP OF WATER:  doxycycline (MONODOX) 100 MG capsule  pantoprazole (PROTONIX) 40 MG tablet Antacid (take one the night before and one on the  morning of surgery - helps to prevent nausea after surgery.) pregabalin (LYRICA) 100 MG capsule  tamoxifen (NOLVADEX) 20 MG tablet  oxyCODONE-acetaminophen (PERCOCET/ROXICET) 5-325 MG tablet if needed methocarbamol (ROBAXIN) 750 MG tablet if needed  No Alcohol for 24 hours before or after surgery.  No Smoking including e-cigarettes for 24 hours before surgery.  No chewable tobacco products for at least 6 hours before surgery.  No nicotine patches on the day of surgery.  Do not use any "recreational" drugs for at least a week (preferably 2 weeks) before your surgery.  Please be advised that the combination of cocaine and anesthesia may have negative outcomes, up to and including death. If you test positive for cocaine, your surgery will be cancelled.  On the morning of surgery brush your teeth with toothpaste and water, you may rinse your mouth with mouthwash if you wish. Do not swallow any toothpaste or mouthwash.  Use CHG Soap or wipes as directed on instruction sheet.  Do not wear lotions, powders, or perfumes. No deodorant  Do not shave body hair from the neck down 48 hours before surgery.  Wear comfortable clothing (specific to your surgery type) to the hospital.  Do not wear jewelry, make-up, hairpins, clips or nail polish.  Contact lenses, hearing aids and dentures may not be worn into surgery.  Do not bring valuables to the hospital. [facility] is not responsible for any missing/lost belongings or valuables.   Notify your doctor if there is any change in your medical condition (cold, fever, infection).  If you are being discharged  the day of surgery, you will not be allowed to drive home. You will need a responsible individual to drive you home and stay with you for 24 hours after surgery.   If you are taking public transportation, you will need to have a responsible individual with you.  If you are being admitted to the hospital overnight, leave your suitcase in the  car. After surgery it may be brought to your room.  In case of increased patient census, it may be necessary for you, the patient, to continue your postoperative care in the Same Day Surgery department.  After surgery, you can help prevent lung complications by doing breathing exercises.  Take deep breaths and cough every 1-2 hours. Your doctor may order a device called an Incentive Spirometer to help you take deep breaths. When coughing or sneezing, hold a pillow firmly against your incision with both hands. This is called "splinting." Doing this helps protect your incision. It also decreases belly discomfort.  Surgery Visitation Policy:  Patients undergoing a surgery or procedure may have two family members or support persons with them as long as the person is not COVID-19 positive or experiencing its symptoms.   Inpatient Visitation:    Visiting hours are 7 a.m. to 8 p.m. Up to four visitors are allowed at one time in a patient room. The visitors may rotate out with other people during the day. One designated support person (adult) may remain overnight.  Due to an increase in RSV and influenza rates and associated hospitalizations, children ages 106 and under will not be able to visit patients in [facility]. Masks continue to be strongly recommended.  Please call the [facility] Dept. at [phone] if you have any questions about these instructions.  Preparing the Skin Before Surgery     To help prevent the risk of infection at your surgical site, we are now providing you with rinse-free Sage 2% Chlorhexidine Gluconate (CHG) disposable wipes.  Chlorhexidine Gluconate (CHG) Soap  o An antiseptic cleaner that kills germs and bonds with the skin to continue killing germs even after washing  o Used for showering the night before surgery and morning of surgery  The night before surgery: Shower or bathe with warm water. Do not apply perfume, lotions,  powders. Wait one hour after shower. Skin should be dry and cool. Open Sage wipe package - use 6 disposable cloths. Wipe body using one cloth for the right arm, one cloth for the left arm, one cloth for the right leg, one cloth for the left leg, one cloth for the chest/abdomen area, and one cloth for the back. Do not use on open wounds or sores. Do not use on face or genitals (private parts). If you are breast feeding, do not use on breasts. 5. Do not rinse, allow to dry. 6. Skin may feel "tacky" for several minutes. 7. Dress in clean clothes. 8. Place clean sheets on your bed and do not sleep with pets.  REPEAT ABOVE ON THE MORNING OF SURGERY BEFORE ARRIVING TO [place].

## 2022-06-26 NOTE — Progress Notes (Signed)
  Perioperative Services Pre-Admission/Anesthesia Testing    Date: 06/26/22  Name: Kara [name] MRN:   [id number]  Re: GLP-1 clearance and provider recommendations   Planned Surgical Procedure(s):    Case: [id number] Date/Time: 07/03/22 0700   Procedures:      L3-5 LATERAL LUMBAR INTERBODY FUSION WITH POSTERIOR SPINAL FUSION     APPLICATION OF INTRAOPERATIVE CT SCAN   Anesthesia type: General   Pre-op diagnosis:      M43.16 Spondylolisthesis of lumbar region     M48.062 Neurogenic claudication due to lumbar spinal stenosis     M54.42 G89.29 Chronic bilateral low back pain with left-sided sciatica     M53.2X6 Spinal instability, lumbar   Location: [facility] OR ROOM 03 / [facility]   Surgeons: Meade Maw, MD      Clinical Notes:  Patient is scheduled for the above procedure with the indicated provider/surgeon. In review of her medication reconciliation it was noted that patient is on a prescribed GLP-1 medication. Per guidelines issued by the American Society of Anesthesiologists (ASA), it is recommended that these medications be held for 7 days prior to the patient undergoing any type of elective surgical procedure. The patient is taking the following GLP-1 medication:  '[]'$  SEMAGLUTIDE   '[]'$  EXENATIDE  '[]'$  LIRAGLUTIDE   '[]'$  LIXISENATIDE  '[x]'$  DULAGLUTIDE     '[]'$  OTHER GLP-1 medication: _______________  Reached out to prescribing provider to make them aware of the guidelines from anesthesia. Given that this patient takes the prescribed GLP-1 medication for her  diabetes diagnosis, rather than for weight loss, recommendations from the prescribing provider were solicited. Prescribing provider made aware of the following so that informed decision/POC can be developed for this patient that may be taking medications belonging to these drug classes:  Oral GLP-1 medications will be held 1 day prior to surgery.  Injectable GLP-1 medications will be held 7 days prior to  surgery.  Metformin is routinely held 48 hours prior to surgery due to renal concerns, potential need for contrasted imaging perioperatively, and the potential for tissue hypoxia leading to drug induced lactic acidosis.  All SGLT2i medications are held 72 hours prior to surgery as they can be associated with the increased potential for developing euglycemic diabetic ketoacidosis (EDKA).   Impression and Plan:  Kara [name] is on a prescribed GLP-1 medication, which induces the known side effect of decreased gastric emptying. Efforts are bring made to mitigate the risk of perioperative hyperglycemic events, as elevated blood glucose levels have been found to contribute to intra/postoperative complications. Additionally, hyperglycemic extremes can potentially necessitate the postponing of a patient's elective case in order to better optimize perioperative glycemic control, again with the aforementioned guidelines in place. With this in mind, recommendations have been sought from the prescribing provider, who has cleared patient to proceed with holding the prescribed GLP-1 as per the guidelines from the ASA.   Provider recommending: no further recommendations received from the prescribing provider.  Copy of signed clearance and recommendations placed on patient's chart for inclusion in their medical record and for review by the surgical/anesthetic team on the day of her procedure.   Honor Loh, MSN, APRN, FNP-C, CEN [facility]  Peri-operative Services Nurse Practitioner Phone: [phone] 06/26/22 9:32 AM  NOTE: This note has been prepared using Dragon dictation software. Despite my best ability to proofread, there is always the potential that unintentional transcriptional errors may still occur from this process.

## 2022-07-02 MED ORDER — CHLORHEXIDINE GLUCONATE 0.12 % MT SOLN: 15.0000 mL | Freq: Once | OROMUCOSAL | Status: AC

## 2022-07-02 MED ORDER — SODIUM CHLORIDE 0.9 % IV SOLN: INTRAVENOUS | Status: DC

## 2022-07-02 MED ORDER — CEFAZOLIN SODIUM-DEXTROSE 2-4 GM/100ML-% IV SOLN: 2.0000 g | INTRAVENOUS | Status: DC

## 2022-07-02 MED ORDER — VANCOMYCIN HCL IN DEXTROSE 1-5 GM/200ML-% IV SOLN
1000.0000 mg | Freq: Once | INTRAVENOUS | Status: AC
  Administered 2022-07-03: 1000 mg via INTRAVENOUS

## 2022-07-02 MED ORDER — CEFAZOLIN IN SODIUM CHLORIDE 2-0.9 GM/100ML-% IV SOLN
2.0000 g | Freq: Once | INTRAVENOUS | Status: DC
  Filled 2022-07-02: qty 100

## 2022-07-02 MED ORDER — ORAL CARE MOUTH RINSE: 15.0000 mL | Freq: Once | OROMUCOSAL | Status: AC

## 2022-07-03 ENCOUNTER — Encounter: Payer: Self-pay | Admitting: Neurosurgery

## 2022-07-03 ENCOUNTER — Inpatient Hospital Stay: Payer: Medicare HMO | Admitting: Urgent Care

## 2022-07-03 ENCOUNTER — Other Ambulatory Visit: Payer: Self-pay

## 2022-07-03 ENCOUNTER — Inpatient Hospital Stay
Admission: RE | Admit: 2022-07-03 | Discharge: 2022-07-07 | DRG: 455 | Disposition: A | Discharge status: Home Health | Payer: Medicare HMO | Source: Ambulatory Visit | Attending: Neurosurgery | Admitting: Neurosurgery

## 2022-07-03 ENCOUNTER — Encounter
Admission: RE | Disposition: A | Discharge status: Home Health | Payer: Self-pay | Source: Ambulatory Visit | Attending: Neurosurgery

## 2022-07-03 ENCOUNTER — Inpatient Hospital Stay: Payer: Medicare HMO

## 2022-07-03 DIAGNOSIS — M532X6 Spinal instabilities, lumbar region: Secondary | ICD-10-CM | POA: Diagnosis not present

## 2022-07-03 DIAGNOSIS — M1990 Unspecified osteoarthritis, unspecified site: Secondary | ICD-10-CM | POA: Diagnosis present

## 2022-07-03 DIAGNOSIS — N189 Chronic kidney disease, unspecified: Secondary | ICD-10-CM | POA: Diagnosis present

## 2022-07-03 DIAGNOSIS — I129 Hypertensive chronic kidney disease with stage 1 through stage 4 chronic kidney disease, or unspecified chronic kidney disease: Secondary | ICD-10-CM | POA: Diagnosis present

## 2022-07-03 DIAGNOSIS — M4316 Spondylolisthesis, lumbar region: Secondary | ICD-10-CM

## 2022-07-03 DIAGNOSIS — Z880 Allergy status to penicillin: Secondary | ICD-10-CM

## 2022-07-03 DIAGNOSIS — Z803 Family history of malignant neoplasm of breast: Secondary | ICD-10-CM

## 2022-07-03 DIAGNOSIS — E785 Hyperlipidemia, unspecified: Secondary | ICD-10-CM | POA: Diagnosis not present

## 2022-07-03 DIAGNOSIS — E1122 Type 2 diabetes mellitus with diabetic chronic kidney disease: Secondary | ICD-10-CM | POA: Diagnosis not present

## 2022-07-03 DIAGNOSIS — G8929 Other chronic pain: Secondary | ICD-10-CM | POA: Diagnosis not present

## 2022-07-03 DIAGNOSIS — Z801 Family history of malignant neoplasm of trachea, bronchus and lung: Secondary | ICD-10-CM | POA: Diagnosis not present

## 2022-07-03 DIAGNOSIS — Z800 Family history of malignant neoplasm of digestive organs: Secondary | ICD-10-CM | POA: Diagnosis not present

## 2022-07-03 DIAGNOSIS — D649 Anemia, unspecified: Secondary | ICD-10-CM

## 2022-07-03 DIAGNOSIS — Z794 Long term (current) use of insulin: Secondary | ICD-10-CM

## 2022-07-03 DIAGNOSIS — E1165 Type 2 diabetes mellitus with hyperglycemia: Secondary | ICD-10-CM | POA: Diagnosis not present

## 2022-07-03 DIAGNOSIS — Z923 Personal history of irradiation: Secondary | ICD-10-CM | POA: Diagnosis not present

## 2022-07-03 DIAGNOSIS — Z87891 Personal history of nicotine dependence: Secondary | ICD-10-CM | POA: Diagnosis not present

## 2022-07-03 DIAGNOSIS — M5136 Other intervertebral disc degeneration, lumbar region: Secondary | ICD-10-CM | POA: Diagnosis present

## 2022-07-03 DIAGNOSIS — M5442 Lumbago with sciatica, left side: Secondary | ICD-10-CM | POA: Diagnosis not present

## 2022-07-03 DIAGNOSIS — Z01818 Encounter for other preprocedural examination: Secondary | ICD-10-CM

## 2022-07-03 DIAGNOSIS — E669 Obesity, unspecified: Secondary | ICD-10-CM

## 2022-07-03 DIAGNOSIS — M48062 Spinal stenosis, lumbar region with neurogenic claudication: Secondary | ICD-10-CM | POA: Diagnosis not present

## 2022-07-03 DIAGNOSIS — Z8616 Personal history of COVID-19: Secondary | ICD-10-CM | POA: Diagnosis not present

## 2022-07-03 DIAGNOSIS — Z981 Arthrodesis status: Secondary | ICD-10-CM | POA: Diagnosis not present

## 2022-07-03 DIAGNOSIS — E118 Type 2 diabetes mellitus with unspecified complications: Secondary | ICD-10-CM

## 2022-07-03 DIAGNOSIS — E119 Type 2 diabetes mellitus without complications: Secondary | ICD-10-CM | POA: Diagnosis not present

## 2022-07-03 DIAGNOSIS — M545 Low back pain, unspecified: Secondary | ICD-10-CM | POA: Diagnosis present

## 2022-07-03 HISTORY — PX: ANTERIOR LATERAL LUMBAR FUSION WITH PERCUTANEOUS SCREW 2 LEVEL: SHX5554

## 2022-07-03 HISTORY — PX: APPLICATION OF INTRAOPERATIVE CT SCAN: SHX6668

## 2022-07-03 LAB — GLUCOSE, CAPILLARY
Glucose-Capillary: 98 mg/dL (ref 70–99)
Glucose-Capillary: 162 mg/dL — ABNORMAL HIGH (ref 70–99)
Glucose-Capillary: 278 mg/dL — ABNORMAL HIGH (ref 70–99)
Glucose-Capillary: 282 mg/dL — ABNORMAL HIGH (ref 70–99)

## 2022-07-03 SURGERY — ANTERIOR LATERAL LUMBAR FUSION WITH PERCUTANEOUS SCREW 2 LEVEL
Anesthesia: General | Site: Spine Lumbar

## 2022-07-03 MED ORDER — SODIUM CHLORIDE (PF) 0.9 % IJ SOLN
INTRAMUSCULAR | Status: DC | PRN
  Administered 2022-07-03: 60 mL via INTRAMUSCULAR

## 2022-07-03 MED ORDER — OXYCODONE HCL 5 MG PO TABS
5.0000 mg | ORAL_TABLET | ORAL | Status: DC | PRN
  Administered 2022-07-03 – 2022-07-07 (×8): 5 mg via ORAL
  Filled 2022-07-03 (×8): qty 1

## 2022-07-03 MED ORDER — ONDANSETRON HCL 4 MG PO TABS: 4.0000 mg | ORAL_TABLET | Freq: Four times a day (QID) | ORAL | Status: DC | PRN

## 2022-07-03 MED ORDER — TAMOXIFEN CITRATE 10 MG PO TABS
20.0000 mg | ORAL_TABLET | Freq: Every day | ORAL | Status: DC
  Administered 2022-07-03 – 2022-07-07 (×5): 20 mg via ORAL
  Filled 2022-07-03 (×5): qty 2

## 2022-07-03 MED ORDER — BISACODYL 10 MG RE SUPP: 10.0000 mg | Freq: Every day | RECTAL | Status: DC | PRN

## 2022-07-03 MED ORDER — MAGNESIUM CITRATE PO SOLN: 1.0000 | Freq: Once | ORAL | Status: DC | PRN

## 2022-07-03 MED ORDER — SODIUM CHLORIDE FLUSH 0.9 % IV SOLN
INTRAVENOUS | Status: AC
  Filled 2022-07-03: qty 20

## 2022-07-03 MED ORDER — DEXAMETHASONE SODIUM PHOSPHATE 10 MG/ML IJ SOLN
INTRAMUSCULAR | Status: AC
  Filled 2022-07-03: qty 1

## 2022-07-03 MED ORDER — INSULIN ASPART 100 UNIT/ML IJ SOLN
0.0000 [IU] | Freq: Three times a day (TID) | INTRAMUSCULAR | Status: DC
  Administered 2022-07-03: 11 [IU] via SUBCUTANEOUS
  Administered 2022-07-04: 7 [IU] via SUBCUTANEOUS
  Administered 2022-07-04 – 2022-07-07 (×9): 4 [IU] via SUBCUTANEOUS
  Filled 2022-07-03 (×11): qty 1

## 2022-07-03 MED ORDER — ONDANSETRON HCL 4 MG/2ML IJ SOLN: 4.0000 mg | Freq: Four times a day (QID) | INTRAMUSCULAR | Status: DC | PRN

## 2022-07-03 MED ORDER — BUPIVACAINE LIPOSOME 1.3 % IJ SUSP
INTRAMUSCULAR | Status: AC
  Filled 2022-07-03: qty 20

## 2022-07-03 MED ORDER — HYDROMORPHONE HCL 1 MG/ML IJ SOLN
INTRAMUSCULAR | Status: AC
  Administered 2022-07-03: 0.5 mg via INTRAVENOUS
  Filled 2022-07-03: qty 1

## 2022-07-03 MED ORDER — ONDANSETRON HCL 4 MG/2ML IJ SOLN
INTRAMUSCULAR | Status: DC | PRN
  Administered 2022-07-03: 4 mg via INTRAVENOUS

## 2022-07-03 MED ORDER — CHLORHEXIDINE GLUCONATE 0.12 % MT SOLN
OROMUCOSAL | Status: AC
  Administered 2022-07-03: 15 mL via OROMUCOSAL
  Filled 2022-07-03: qty 15

## 2022-07-03 MED ORDER — SODIUM CHLORIDE 0.9 % IV SOLN
INTRAVENOUS | Status: DC | PRN
  Administered 2022-07-03: .07 ug/kg/min via INTRAVENOUS

## 2022-07-03 MED ORDER — OXYCODONE HCL 5 MG PO TABS
10.0000 mg | ORAL_TABLET | ORAL | Status: DC | PRN
  Administered 2022-07-03 – 2022-07-06 (×4): 10 mg via ORAL
  Filled 2022-07-03 (×4): qty 2

## 2022-07-03 MED ORDER — PROPOFOL 10 MG/ML IV BOLUS
INTRAVENOUS | Status: AC
  Filled 2022-07-03: qty 40

## 2022-07-03 MED ORDER — IRRISEPT - 450ML BOTTLE WITH 0.05% CHG IN STERILE WATER, USP 99.95% OPTIME
TOPICAL | Status: DC | PRN
  Administered 2022-07-03: 450 mL

## 2022-07-03 MED ORDER — ROSUVASTATIN CALCIUM 10 MG PO TABS
20.0000 mg | ORAL_TABLET | Freq: Every day | ORAL | Status: DC
  Administered 2022-07-03 – 2022-07-07 (×5): 20 mg via ORAL
  Filled 2022-07-03 (×5): qty 2

## 2022-07-03 MED ORDER — SODIUM CHLORIDE 0.9% FLUSH: 3.0000 mL | INTRAVENOUS | Status: DC | PRN

## 2022-07-03 MED ORDER — EPINEPHRINE PF 1 MG/ML IJ SOLN
INTRAMUSCULAR | Status: AC
  Filled 2022-07-03: qty 1

## 2022-07-03 MED ORDER — PANTOPRAZOLE SODIUM 40 MG PO TBEC
40.0000 mg | DELAYED_RELEASE_TABLET | Freq: Every day | ORAL | Status: DC
  Administered 2022-07-03 – 2022-07-07 (×5): 40 mg via ORAL
  Filled 2022-07-03 (×5): qty 1

## 2022-07-03 MED ORDER — METHOCARBAMOL 500 MG PO TABS
500.0000 mg | ORAL_TABLET | Freq: Four times a day (QID) | ORAL | Status: DC | PRN
  Administered 2022-07-03 – 2022-07-06 (×2): 500 mg via ORAL
  Filled 2022-07-03 (×2): qty 1

## 2022-07-03 MED ORDER — SURGIFLO WITH THROMBIN (HEMOSTATIC MATRIX KIT) OPTIME
TOPICAL | Status: DC | PRN
  Administered 2022-07-03: 1 via TOPICAL

## 2022-07-03 MED ORDER — DOXYCYCLINE HYCLATE 100 MG PO TABS
100.0000 mg | ORAL_TABLET | Freq: Two times a day (BID) | ORAL | Status: DC
  Administered 2022-07-03 – 2022-07-07 (×8): 100 mg via ORAL
  Filled 2022-07-03 (×8): qty 1

## 2022-07-03 MED ORDER — PHENYLEPHRINE HCL-NACL 20-0.9 MG/250ML-% IV SOLN
INTRAVENOUS | Status: DC | PRN
  Administered 2022-07-03: 50 ug/min via INTRAVENOUS

## 2022-07-03 MED ORDER — LIDOCAINE HCL (CARDIAC) PF 100 MG/5ML IV SOSY
PREFILLED_SYRINGE | INTRAVENOUS | Status: DC | PRN
  Administered 2022-07-03: 100 mg via INTRAVENOUS

## 2022-07-03 MED ORDER — FENTANYL CITRATE (PF) 100 MCG/2ML IJ SOLN
INTRAMUSCULAR | Status: DC | PRN
  Administered 2022-07-03: 50 ug via INTRAVENOUS

## 2022-07-03 MED ORDER — MIDAZOLAM HCL 2 MG/2ML IJ SOLN
INTRAMUSCULAR | Status: DC | PRN
  Administered 2022-07-03: 1 mg via INTRAVENOUS

## 2022-07-03 MED ORDER — FENTANYL CITRATE (PF) 100 MCG/2ML IJ SOLN
INTRAMUSCULAR | Status: AC
  Filled 2022-07-03: qty 2

## 2022-07-03 MED ORDER — FENTANYL CITRATE (PF) 100 MCG/2ML IJ SOLN
25.0000 ug | INTRAMUSCULAR | Status: DC | PRN
  Administered 2022-07-03 (×2): 50 ug via INTRAVENOUS

## 2022-07-03 MED ORDER — DEXAMETHASONE SODIUM PHOSPHATE 10 MG/ML IJ SOLN
INTRAMUSCULAR | Status: DC | PRN
  Administered 2022-07-03: 10 mg via INTRAVENOUS

## 2022-07-03 MED ORDER — PREGABALIN 50 MG PO CAPS
100.0000 mg | ORAL_CAPSULE | Freq: Two times a day (BID) | ORAL | Status: DC
  Administered 2022-07-03 – 2022-07-07 (×8): 100 mg via ORAL
  Filled 2022-07-03 (×8): qty 2

## 2022-07-03 MED ORDER — SUCCINYLCHOLINE CHLORIDE 200 MG/10ML IV SOSY
PREFILLED_SYRINGE | INTRAVENOUS | Status: DC | PRN
  Administered 2022-07-03: 100 mg via INTRAVENOUS

## 2022-07-03 MED ORDER — 0.9 % SODIUM CHLORIDE (POUR BTL) OPTIME
TOPICAL | Status: DC | PRN
  Administered 2022-07-03: 500 mL

## 2022-07-03 MED ORDER — MORPHINE SULFATE (PF) 2 MG/ML IV SOLN
2.0000 mg | INTRAVENOUS | Status: DC | PRN
  Administered 2022-07-03: 2 mg via INTRAVENOUS
  Filled 2022-07-03: qty 1

## 2022-07-03 MED ORDER — SODIUM CHLORIDE 0.9 % IV SOLN: INTRAVENOUS | Status: DC

## 2022-07-03 MED ORDER — BUPIVACAINE-EPINEPHRINE 0.5% -1:200000 IJ SOLN
INTRAMUSCULAR | Status: DC | PRN
  Administered 2022-07-03: 3 mL
  Administered 2022-07-03: 7 mL

## 2022-07-03 MED ORDER — SODIUM CHLORIDE 0.9% FLUSH
3.0000 mL | Freq: Two times a day (BID) | INTRAVENOUS | Status: DC
  Administered 2022-07-03 – 2022-07-04 (×2): 3 mL via INTRAVENOUS

## 2022-07-03 MED ORDER — FUROSEMIDE 20 MG PO TABS
20.0000 mg | ORAL_TABLET | Freq: Two times a day (BID) | ORAL | Status: DC
  Administered 2022-07-03 – 2022-07-07 (×8): 20 mg via ORAL
  Filled 2022-07-03 (×8): qty 1

## 2022-07-03 MED ORDER — METHOCARBAMOL 1000 MG/10ML IJ SOLN
500.0000 mg | Freq: Four times a day (QID) | INTRAVENOUS | Status: DC | PRN
  Administered 2022-07-03: 500 mg via INTRAVENOUS
  Filled 2022-07-03: qty 500

## 2022-07-03 MED ORDER — HYDROMORPHONE HCL 1 MG/ML IJ SOLN
0.2500 mg | INTRAMUSCULAR | Status: DC | PRN
  Administered 2022-07-03: 0.5 mg via INTRAVENOUS

## 2022-07-03 MED ORDER — DOCUSATE SODIUM 100 MG PO CAPS
100.0000 mg | ORAL_CAPSULE | Freq: Two times a day (BID) | ORAL | Status: DC
  Administered 2022-07-03 – 2022-07-07 (×8): 100 mg via ORAL
  Filled 2022-07-03 (×8): qty 1

## 2022-07-03 MED ORDER — MIDAZOLAM HCL 2 MG/2ML IJ SOLN
INTRAMUSCULAR | Status: AC
  Filled 2022-07-03: qty 2

## 2022-07-03 MED ORDER — SODIUM CHLORIDE 0.9 % IV SOLN: 250.0000 mL | INTRAVENOUS | Status: DC

## 2022-07-03 MED ORDER — ENOXAPARIN SODIUM 40 MG/0.4ML IJ SOSY
40.0000 mg | PREFILLED_SYRINGE | INTRAMUSCULAR | Status: DC
  Administered 2022-07-04 – 2022-07-07 (×4): 40 mg via SUBCUTANEOUS
  Filled 2022-07-03 (×4): qty 0.4

## 2022-07-03 MED ORDER — REMIFENTANIL HCL 1 MG IV SOLR
INTRAVENOUS | Status: AC
  Filled 2022-07-03: qty 1000

## 2022-07-03 MED ORDER — SUCCINYLCHOLINE CHLORIDE 200 MG/10ML IV SOSY
PREFILLED_SYRINGE | INTRAVENOUS | Status: AC
  Filled 2022-07-03: qty 10

## 2022-07-03 MED ORDER — PROPOFOL 10 MG/ML IV BOLUS
INTRAVENOUS | Status: DC | PRN
  Administered 2022-07-03: 140 mg via INTRAVENOUS

## 2022-07-03 MED ORDER — FENTANYL CITRATE (PF) 100 MCG/2ML IJ SOLN
50.0000 ug | Freq: Once | INTRAMUSCULAR | Status: AC
  Administered 2022-07-03: 50 ug via INTRAVENOUS

## 2022-07-03 MED ORDER — ONDANSETRON HCL 4 MG/2ML IJ SOLN
INTRAMUSCULAR | Status: AC
  Filled 2022-07-03: qty 2

## 2022-07-03 MED ORDER — EPHEDRINE SULFATE (PRESSORS) 50 MG/ML IJ SOLN
INTRAMUSCULAR | Status: DC | PRN
  Administered 2022-07-03: 5 mg via INTRAVENOUS

## 2022-07-03 MED ORDER — MENTHOL 3 MG MT LOZG: 1.0000 | LOZENGE | OROMUCOSAL | Status: DC | PRN

## 2022-07-03 MED ORDER — POLYETHYLENE GLYCOL 3350 17 G PO PACK: 17.0000 g | PACK | Freq: Every day | ORAL | Status: DC | PRN

## 2022-07-03 MED ORDER — ACETAMINOPHEN 500 MG PO TABS
1000.0000 mg | ORAL_TABLET | Freq: Four times a day (QID) | ORAL | Status: DC
  Administered 2022-07-03 – 2022-07-07 (×16): 1000 mg via ORAL
  Filled 2022-07-03 (×16): qty 2

## 2022-07-03 MED ORDER — FENTANYL CITRATE (PF) 100 MCG/2ML IJ SOLN
INTRAMUSCULAR | Status: AC
  Administered 2022-07-03: 50 ug via INTRAVENOUS
  Filled 2022-07-03: qty 2

## 2022-07-03 MED ORDER — INSULIN ASPART 100 UNIT/ML IJ SOLN
0.0000 [IU] | Freq: Every day | INTRAMUSCULAR | Status: DC
  Administered 2022-07-03: 3 [IU] via SUBCUTANEOUS
  Filled 2022-07-03: qty 1

## 2022-07-03 MED ORDER — VANCOMYCIN HCL IN DEXTROSE 1-5 GM/200ML-% IV SOLN
INTRAVENOUS | Status: AC
  Administered 2022-07-03: 1000 mg
  Filled 2022-07-03: qty 200

## 2022-07-03 MED ORDER — SENNA 8.6 MG PO TABS
1.0000 | ORAL_TABLET | Freq: Two times a day (BID) | ORAL | Status: DC
  Administered 2022-07-03 – 2022-07-07 (×4): 8.6 mg via ORAL
  Filled 2022-07-03 (×7): qty 1

## 2022-07-03 MED ORDER — PHENOL 1.4 % MT LIQD: 1.0000 | OROMUCOSAL | Status: DC | PRN

## 2022-07-03 MED ORDER — ONDANSETRON HCL 4 MG/2ML IJ SOLN: 4.0000 mg | Freq: Once | INTRAMUSCULAR | Status: DC | PRN

## 2022-07-03 MED ORDER — CEFAZOLIN SODIUM-DEXTROSE 2-4 GM/100ML-% IV SOLN
INTRAVENOUS | Status: AC
  Filled 2022-07-03: qty 100

## 2022-07-03 MED ORDER — LIDOCAINE HCL (PF) 2 % IJ SOLN
INTRAMUSCULAR | Status: AC
  Filled 2022-07-03: qty 5

## 2022-07-03 MED ORDER — PROPOFOL 1000 MG/100ML IV EMUL
INTRAVENOUS | Status: AC
  Filled 2022-07-03: qty 100

## 2022-07-03 MED ORDER — BUPIVACAINE HCL (PF) 0.5 % IJ SOLN
INTRAMUSCULAR | Status: AC
  Filled 2022-07-03: qty 60

## 2022-07-03 SURGICAL SUPPLY — 93 items
ADH SKN CLS APL DERMABOND .7 (GAUZE/BANDAGES/DRESSINGS) ×3
AGENT HMST KT MTR STRL THRMB (HEMOSTASIS) ×1
ALLOGRAFT BONE FIBER KORE 5 (Bone Implant) IMPLANT
APL PRP STRL LF DISP 70% ISPRP (MISCELLANEOUS) ×2
BASIN KIT SINGLE STR (MISCELLANEOUS) ×1 IMPLANT
BUR NEURO DRILL SOFT 3.0X3.8M (BURR) IMPLANT
CHLORAPREP W/TINT 26 (MISCELLANEOUS) ×2 IMPLANT
CORD BIP STRL DISP 12FT (MISCELLANEOUS) ×1 IMPLANT
CORD LIGHT LATERIAL X LIFT (MISCELLANEOUS) IMPLANT
COUNTER NEEDLE 20/40 LG (NEEDLE) ×2 IMPLANT
COVER BACK TABLE REUSABLE LG (DRAPES) ×1 IMPLANT
COVERAGE SUPP BRAINLAB NG SPNE (MISCELLANEOUS) IMPLANT
COVERAGE SUPPORT SPINE BRAINLB (MISCELLANEOUS)
CUP MEDICINE 2OZ PLAST GRAD ST (MISCELLANEOUS) ×1 IMPLANT
DERMABOND ADVANCED .7 DNX12 (GAUZE/BANDAGES/DRESSINGS) ×3 IMPLANT
DRAPE 3D C-ARM OEC (DRAPES) IMPLANT
DRAPE C ARM PK CFD 31 SPINE (DRAPES) ×1 IMPLANT
DRAPE C-ARMOR (DRAPES) IMPLANT
DRAPE INCISE IOBAN 66X45 STRL (DRAPES) ×1 IMPLANT
DRAPE LAPAROTOMY 100X77 ABD (DRAPES) ×2 IMPLANT
DRAPE MICROSCOPE SPINE 48X150 (DRAPES) IMPLANT
DRAPE SCAN PATIENT (DRAPES) ×1 IMPLANT
DRSG OPSITE POSTOP 3X4 (GAUZE/BANDAGES/DRESSINGS) IMPLANT
DRSG OPSITE POSTOP 4X6 (GAUZE/BANDAGES/DRESSINGS) IMPLANT
DRSG TEGADERM 2-3/8X2-3/4 SM (GAUZE/BANDAGES/DRESSINGS) IMPLANT
DRSG TEGADERM 4X4.75 (GAUZE/BANDAGES/DRESSINGS) IMPLANT
DRSG TEGADERM 6X8 (GAUZE/BANDAGES/DRESSINGS) IMPLANT
ELECT CAUTERY BLADE TIP 2.5 (TIP) ×1
ELECT EZSTD 165MM 6.5IN (MISCELLANEOUS)
ELECT REM PT RETURN 9FT ADLT (ELECTROSURGICAL) ×2
ELECTRODE CAUTERY BLDE TIP 2.5 (TIP) ×1 IMPLANT
ELECTRODE EZSTD 165MM 6.5IN (MISCELLANEOUS) ×1 IMPLANT
ELECTRODE REM PT RTRN 9FT ADLT (ELECTROSURGICAL) ×2 IMPLANT
EX-PIN ORTHOLOCK NAV 4X150 (PIN) IMPLANT
FEE CVG SUPP BRAINLAB NG SPNE (MISCELLANEOUS) IMPLANT
FEE INTRAOP CADWELL SUPPLY NCS (MISCELLANEOUS) ×1 IMPLANT
FEE INTRAOP MONITOR IMPULS NCS (MISCELLANEOUS) ×1 IMPLANT
FORCEPS BPLR BAYO 10IN 1.0TIP (ORTHOPEDIC DISPOSABLE SUPPLIES) IMPLANT
GAUZE 4X4 16PLY ~~LOC~~+RFID DBL (SPONGE) IMPLANT
GAUZE SPONGE 2X2 STRL 8-PLY (GAUZE/BANDAGES/DRESSINGS) IMPLANT
GLOVE SURG SYN 6.5 ES PF (GLOVE) ×5 IMPLANT
GLOVE SURG SYN 6.5 PF PI (GLOVE) ×5 IMPLANT
GLOVE SURG SYN 8.5  E (GLOVE) ×6
GLOVE SURG SYN 8.5 E (GLOVE) ×6 IMPLANT
GLOVE SURG SYN 8.5 PF PI (GLOVE) ×6 IMPLANT
GLOVE SURG UNDER POLY LF SZ6.5 (GLOVE) ×3 IMPLANT
GOWN SRG LRG LVL 4 IMPRV REINF (GOWNS) ×2 IMPLANT
GOWN SRG XL LVL 3 NONREINFORCE (GOWNS) ×2 IMPLANT
GOWN STRL NON-REIN TWL XL LVL3 (GOWNS) ×2
GOWN STRL REIN LRG LVL4 (GOWNS) ×2
HOLDER FOLEY CATH W/STRAP (MISCELLANEOUS) ×1 IMPLANT
INTRAOP CADWELL SUPPLY FEE NCS (MISCELLANEOUS) ×1
INTRAOP DISP SUPPLY FEE NCS (MISCELLANEOUS) ×1
INTRAOP MONITOR FEE IMPULS NCS (MISCELLANEOUS) ×1
INTRAOP MONITOR FEE IMPULSE (MISCELLANEOUS) ×1
JET LAVAGE IRRISEPT WOUND (IRRIGATION / IRRIGATOR) ×1
K-WIRE 1.6 NITINOL SHARP TIP (WIRE) ×6
KIT DILATOR XLIF 5 (KITS) IMPLANT
KIT DISP MARS 3V (KITS) IMPLANT
KIT SPINAL PRONEVIEW (KITS) ×1 IMPLANT
KIT TURNOVER KIT A (KITS) ×1 IMPLANT
KNIFE BAYONET SHORT DISCETOMY (MISCELLANEOUS) IMPLANT
KWIRE 1.6 NITINOL SHARP TIP (WIRE) IMPLANT
LAVAGE JET IRRISEPT WOUND (IRRIGATION / IRRIGATOR) IMPLANT
MANIFOLD NEPTUNE II (INSTRUMENTS) ×2 IMPLANT
MARKER SKIN DUAL TIP RULER LAB (MISCELLANEOUS) ×2 IMPLANT
MARKER SPHERE PSV REFLC 13MM (MARKER) ×7 IMPLANT
MODULE NVM5 NEXT GEN EMG (NEEDLE) IMPLANT
NDL SAFETY ECLIP 18X1.5 (MISCELLANEOUS) ×1 IMPLANT
PACK LAMINECTOMY NEURO (CUSTOM PROCEDURE TRAY) ×1 IMPLANT
PAD ARMBOARD 7.5X6 YLW CONV (MISCELLANEOUS) ×1 IMPLANT
ROD RELINE MAS 5.5X55MM LORD (Rod) IMPLANT
ROD RELINE MAS LORDOTIC 5.5X60 (Rod) IMPLANT
SCREW LOCK RELINE 5.5 TULIP (Screw) IMPLANT
SCREW MAS RED RELINE 6.5X55 (Screw) IMPLANT
SCREW RELINE RED 6.5X50MM POLY (Screw) IMPLANT
SOLUTION IRRIG SURGIPHOR (IV SOLUTION) ×1 IMPLANT
SPACER HEDRON 10D 18X45X11 (Spacer) IMPLANT
STAPLER SKIN PROX 35W (STAPLE) IMPLANT
SURGIFLO W/THROMBIN 8M KIT (HEMOSTASIS) ×1 IMPLANT
SUT DVC VLOC 3-0 CL 6 P-12 (SUTURE) ×1 IMPLANT
SUT ETHILON 3-0 FS-10 30 BLK (SUTURE)
SUT VIC AB 0 CT1 27 (SUTURE) ×1
SUT VIC AB 0 CT1 27XCR 8 STRN (SUTURE) ×1 IMPLANT
SUT VIC AB 2-0 CT1 18 (SUTURE) ×1 IMPLANT
SUTURE EHLN 3-0 FS-10 30 BLK (SUTURE) IMPLANT
SYR 30ML LL (SYRINGE) ×2 IMPLANT
TOWEL OR 17X26 4PK STRL BLUE (TOWEL DISPOSABLE) ×2 IMPLANT
TRAP FLUID SMOKE EVACUATOR (MISCELLANEOUS) ×1 IMPLANT
TRAY FOLEY MTR SLVR 16FR STAT (SET/KITS/TRAYS/PACK) IMPLANT
TROCAR INSERT W/PEDICLE NDL (TROCAR) IMPLANT
WATER STERILE IRR 1000ML POUR (IV SOLUTION) ×2 IMPLANT
WATER STERILE IRR 500ML POUR (IV SOLUTION) IMPLANT

## 2022-07-03 NOTE — Anesthesia Preprocedure Evaluation (Signed)
Anesthesia Evaluation  Patient identified by MRN, date of birth, ID band Patient awake    Reviewed: Allergy & Precautions, H&P , NPO status , Patient's Chart, lab work & pertinent test results  History of Anesthesia Complications Negative for: history of anesthetic complications  Airway Mallampati: III  TM Distance: <3 FB Neck ROM: limited    Dental  (+) Chipped, Dental Advidsory Given   Pulmonary neg shortness of breath, sleep apnea , pneumonia, resolved, neg COPD, neg recent URI, former smoker          Cardiovascular Exercise Tolerance: Good hypertension, (-) angina (-) Past MI, (-) Cardiac Stents and (-) DOE (-) dysrhythmias (-) Valvular Problems/Murmurs     Neuro/Psych  Neuromuscular disease  negative psych ROS   GI/Hepatic negative GI ROS, Neg liver ROS,neg GERD  ,,  Endo/Other  diabetes, Type 2, Insulin Dependent    Renal/GU CRFRenal disease     Musculoskeletal   Abdominal   Peds  Hematology negative hematology ROS (+)   Anesthesia Other Findings Past Medical History: No date: Anemia No date: Arthropathy No date: Benign neoplasm of breast No date: Chest pain No date: Chronic kidney disease     Comment:  30% kidney function No date: Diabetes ([place]) No date: Diabetic retinopathy ([facility]) No date: Dyspnea No date: Edema No date: History of kidney stones No date: Hyperlipemia No date: Hypersomnia with sleep apnea No date: Hypertension No date: 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 EXCISION 2014: BREAST EXCISIONAL BIOPSY; Right     Comment:  neg 01/22/2019: BREAST LUMPECTOMY; Right     Comment:  path pending No date: CATARACT EXTRACTION No date: CHOLECYSTECTOMY 04/14/2015: CYSTOSCOPY WITH STENT PLACEMENT; Left     Comment:  Procedure: CYSTOSCOPY WITH STENT PLACEMENT;  Surgeon:               Hollice Espy, MD;  Location: [facility];  Service:               Urology;  Laterality: Left; 09/29/2014: EXTRACORPOREAL SHOCK WAVE LITHOTRIPSY; Left     Comment:  Procedure: EXTRACORPOREAL SHOCK WAVE LITHOTRIPSY (ESWL);              Surgeon: Hollice Espy, MD;  Location: [facility];                Service: Urology;  Laterality: Left; No date: EYE SURGERY     Comment:  bilateral cataract No date: LAPAROSCOPIC OOPHERECTOMY; Left No date: TONSILLECTOMY No date: TUBAL LIGATION 04/14/2015: URETEROSCOPY WITH HOLMIUM LASER LITHOTRIPSY; Left     Comment:  Procedure: URETEROSCOPY WITH HOLMIUM LASER LITHOTRIPSY;               Surgeon: Hollice Espy, MD;  Location: [facility];                Service: Urology;  Laterality: Left;  BMI    Body  Mass Index: 39.92 kg/m      Reproductive/Obstetrics negative OB ROS                             Anesthesia Physical Anesthesia Plan  ASA: 3  Anesthesia Plan: General   Post-op Pain Management:    Induction: Intravenous  PONV Risk Score and Plan: Ondansetron, Dexamethasone, Midazolam and Treatment may vary due to age or medical condition  Airway Management Planned: Oral ETT and Video Laryngoscope Planned  Additional Equipment:   Intra-op Plan:   Post-operative Plan: Extubation in OR  Informed Consent: I have reviewed the patients History and Physical, chart, labs and discussed the procedure including the risks, benefits and alternatives for the proposed anesthesia with the patient or  authorized representative who has indicated his/her understanding and acceptance.     Dental Advisory Given  Plan Discussed with: Anesthesiologist, CRNA and Surgeon  Anesthesia Plan Comments: (Patient consented for risks of anesthesia including but not limited to:  - adverse reactions to medications - damage to teeth, lips or other oral mucosa - sore throat or hoarseness - Damage to heart, brain, lungs or loss of life  Patient voiced understanding.)        Anesthesia Quick Evaluation

## 2022-07-03 NOTE — Interval H&P Note (Signed)
History and Physical Interval Note:  07/03/2022 7:03 AM  Kara [name]  has presented today for surgery, with the diagnosis of M43.16 Spondylolisthesis of lumbar region M48.062 Neurogenic claudication due to lumbar spinal stenosis M54.42 G89.29 Chronic bilateral [name] back pain with left-sided sciatica M53.2X6 Spinal instability, lumbar.  The various methods of treatment have been discussed with the patient and family. After consideration of risks, benefits and other options for treatment, the patient has consented to  Procedure(s): L3-5 LATERAL LUMBAR INTERBODY FUSION WITH POSTERIOR SPINAL FUSION (N/A) APPLICATION OF INTRAOPERATIVE CT SCAN (N/A) as a surgical intervention.  The patient's history has been reviewed, patient examined, no change in status, stable for surgery.  I have reviewed the patient's chart and labs.  Questions were answered to the patient's satisfaction.    Heart sounds normal no MRG. Chest Clear to Auscultation Bilaterally.   Maili Shutters

## 2022-07-03 NOTE — Anesthesia Postprocedure Evaluation (Signed)
Anesthesia Post Note  Patient: Kara [name]  Procedure(s) Performed: L3-5 LATERAL LUMBAR INTERBODY FUSION WITH POSTERIOR SPINAL FUSION (Spine Lumbar) APPLICATION OF INTRAOPERATIVE CT SCAN (Spine Lumbar)  Patient location during evaluation: PACU Anesthesia Type: General Level of consciousness: awake and alert Pain management: pain level controlled Vital Signs Assessment: post-procedure vital signs reviewed and stable Respiratory status: spontaneous breathing, nonlabored ventilation, respiratory function stable and patient connected to nasal cannula oxygen Cardiovascular status: blood pressure returned to baseline and stable Postop Assessment: no apparent nausea or vomiting Anesthetic complications: no   No notable events documented.   Last Vitals:  Vitals:   07/03/22 1215 07/03/22 1247  BP: 128/79 134/66  Pulse: 76 80  Resp: 12 16  Temp:  (!) 35.8 C  SpO2: 100% 100%    Last Pain:  Vitals:   07/03/22 1215  TempSrc:   PainSc: 4                  Martha Clan

## 2022-07-03 NOTE — Anesthesia Procedure Notes (Signed)
Procedure Name: Intubation Date/Time: 07/03/2022 7:33 AM  Performed by: Fredderick Phenix, CRNAPre-anesthesia Checklist: Patient identified, Emergency Drugs available, Suction available and Patient being monitored Patient Re-evaluated:Patient Re-evaluated prior to induction Oxygen Delivery Method: Circle system utilized Preoxygenation: Pre-oxygenation with 100% oxygen Induction Type: IV induction Ventilation: Mask ventilation without difficulty Laryngoscope Size: McGraph and 4 Grade View: Grade I Tube type: Oral Tube size: 7.0 mm Number of attempts: 1 Airway Equipment and Method: Stylet and Oral airway Placement Confirmation: ETT inserted through vocal cords under direct vision, positive ETCO2 and breath sounds checked- equal and bilateral Secured at: 21 cm Tube secured with: Tape Dental Injury: Teeth and Oropharynx as per pre-operative assessment

## 2022-07-03 NOTE — Transfer of Care (Signed)
Immediate Anesthesia Transfer of Care Note  Patient: Kara [name]  Procedure(s) Performed: L3-5 LATERAL LUMBAR INTERBODY FUSION WITH POSTERIOR SPINAL FUSION (Spine Lumbar) APPLICATION OF INTRAOPERATIVE CT SCAN (Spine Lumbar)  Patient Location: PACU  Anesthesia Type:General  Level of Consciousness: awake and alert   Airway & Oxygen Therapy: Patient Spontanous Breathing and Patient connected to face mask oxygen  Post-op Assessment: Report given to RN and Post -op Vital signs reviewed and stable  Post vital signs: Reviewed and stable  Last Vitals:  Vitals Value Taken Time  BP 155/74 07/03/22 1046  Temp    Pulse 95 07/03/22 1049  Resp 16 07/03/22 1049  SpO2 100 % 07/03/22 1049  Vitals shown include unvalidated device data.  Last Pain:  Vitals:   07/03/22 0631  TempSrc: Temporal  PainSc: 8          Complications: No notable events documented.

## 2022-07-03 NOTE — Plan of Care (Signed)
  Problem: Education: Goal: Ability to describe self-care measures that may prevent or decrease complications (Diabetes Survival Skills Education) will improve Outcome: Progressing Goal: Individualized Educational Video(s) Outcome: Progressing   Problem: Coping: Goal: Ability to adjust to condition or change in health will improve Outcome: Progressing   Problem: Fluid Volume: Goal: Ability to maintain a balanced intake and output will improve Outcome: Progressing   Problem: Health Behavior/Discharge Planning: Goal: Ability to identify and utilize available resources and services will improve Outcome: Progressing Goal: Ability to manage health-related needs will improve Outcome: Progressing   Problem: Metabolic: Goal: Ability to maintain appropriate glucose levels will improve Outcome: Progressing   Problem: Nutritional: Goal: Maintenance of adequate nutrition will improve Outcome: Progressing Goal: Progress toward achieving an optimal weight will improve Outcome: Progressing   Problem: Skin Integrity: Goal: Risk for impaired skin integrity will decrease Outcome: Progressing   Problem: Tissue Perfusion: Goal: Adequacy of tissue perfusion will improve Outcome: Progressing   Problem: Education: Goal: Ability to verbalize activity precautions or restrictions will improve Outcome: Progressing Goal: Knowledge of the prescribed therapeutic regimen will improve Outcome: Progressing Goal: Understanding of discharge needs will improve Outcome: Progressing   Problem: Activity: Goal: Ability to avoid complications of mobility impairment will improve Outcome: Progressing Goal: Ability to tolerate increased activity will improve Outcome: Progressing Goal: Will remain free from falls Outcome: Progressing   Problem: Bowel/Gastric: Goal: Gastrointestinal status for postoperative course will improve Outcome: Progressing   Problem: Clinical Measurements: Goal: Ability to  maintain clinical measurements within normal limits will improve Outcome: Progressing Goal: Postoperative complications will be avoided or minimized Outcome: Progressing Goal: Diagnostic test results will improve Outcome: Progressing   Problem: Pain Management: Goal: Pain level will decrease Outcome: Progressing   Problem: Skin Integrity: Goal: Will show signs of wound healing Outcome: Progressing   Problem: Health Behavior/Discharge Planning: Goal: Identification of resources available to assist in meeting health care needs will improve Outcome: Progressing   Problem: Bladder/Genitourinary: Goal: Urinary functional status for postoperative course will improve Outcome: Progressing   Problem: Education: Goal: Knowledge of General Education information will improve Description: Including pain rating scale, medication(s)/side effects and non-pharmacologic comfort measures Outcome: Progressing   Problem: Health Behavior/Discharge Planning: Goal: Ability to manage health-related needs will improve Outcome: Progressing   Problem: Clinical Measurements: Goal: Ability to maintain clinical measurements within normal limits will improve Outcome: Progressing Goal: Will remain free from infection Outcome: Progressing Goal: Diagnostic test results will improve Outcome: Progressing Goal: Respiratory complications will improve Outcome: Progressing Goal: Cardiovascular complication will be avoided Outcome: Progressing   Problem: Activity: Goal: Risk for activity intolerance will decrease Outcome: Progressing   Problem: Nutrition: Goal: Adequate nutrition will be maintained Outcome: Progressing   Problem: Coping: Goal: Level of anxiety will decrease Outcome: Progressing   Problem: Elimination: Goal: Will not experience complications related to bowel motility Outcome: Progressing Goal: Will not experience complications related to urinary retention Outcome: Progressing    Problem: Pain Managment: Goal: General experience of comfort will improve Outcome: Progressing   Problem: Safety: Goal: Ability to remain free from injury will improve Outcome: Progressing   Problem: Skin Integrity: Goal: Risk for impaired skin integrity will decrease Outcome: Progressing   

## 2022-07-04 ENCOUNTER — Encounter: Payer: Self-pay | Admitting: Neurosurgery

## 2022-07-04 DIAGNOSIS — M532X6 Spinal instabilities, lumbar region: Secondary | ICD-10-CM | POA: Diagnosis present

## 2022-07-04 DIAGNOSIS — M48062 Spinal stenosis, lumbar region with neurogenic claudication: Secondary | ICD-10-CM | POA: Diagnosis present

## 2022-07-04 DIAGNOSIS — M4316 Spondylolisthesis, lumbar region: Secondary | ICD-10-CM

## 2022-07-04 DIAGNOSIS — G8929 Other chronic pain: Secondary | ICD-10-CM | POA: Diagnosis present

## 2022-07-04 LAB — GLUCOSE, CAPILLARY
Glucose-Capillary: 217 mg/dL — ABNORMAL HIGH (ref 70–99)
Glucose-Capillary: 189 mg/dL — ABNORMAL HIGH (ref 70–99)
Glucose-Capillary: 171 mg/dL — ABNORMAL HIGH (ref 70–99)
Glucose-Capillary: 195 mg/dL — ABNORMAL HIGH (ref 70–99)

## 2022-07-04 MED ORDER — SODIUM CHLORIDE 0.9% FLUSH
10.0000 mL | Freq: Two times a day (BID) | INTRAVENOUS | Status: DC
  Administered 2022-07-04 – 2022-07-07 (×6): 10 mL

## 2022-07-04 MED ORDER — SODIUM CHLORIDE 0.9% FLUSH: 10.0000 mL | INTRAVENOUS | Status: DC | PRN

## 2022-07-04 NOTE — Op Note (Signed)
Indications: Ms. Rebeck presented with: M43.16 Spondylolisthesis of lumbar region, M48.062 Neurogenic claudication due to lumbar spinal stenosis, M54.42 G89.29 Chronic bilateral low back pain with left-sided sciatica, M53.2X6 Spinal instability, lumbar   She failed conservative management prompting surgical intervention.  Findings: correction of anterolisthesis  Preoperative Diagnosis: M43.16 Spondylolisthesis of lumbar region, M48.062 Neurogenic claudication due to lumbar spinal stenosis, M54.42 G89.29 Chronic bilateral low back pain with left-sided sciatica, M53.2X6 Spinal instability, lumbar  Postoperative Diagnosis: same   EBL: 50 ml IVF: see AR ml Drains: none Disposition: Extubated and Stable to PACU Complications: none  A foley catheter was placed.   Preoperative Note:   Risks of surgery discussed include: infection, bleeding, stroke, coma, death, paralysis, CSF leak, nerve/spinal cord injury, numbness, tingling, weakness, complex regional pain syndrome, recurrent stenosis and/or disc herniation, vascular injury, development of instability, neck/back pain, need for further surgery, persistent symptoms, development of deformity, and the risks of anesthesia. The patient understood these risks and agreed to proceed.  NAME OF ANTERIOR PROCEDURE:               1. Anterior lumbar interbody fusion via a left lateral retroperitoneal approach at L3/4 and L4/5 2. Placement of a Lordotic Globus Hedron interbody cage, filled with Demineralized Bone Matrix  NAME OF POSTERIOR PROCEDURE 1. Posterior instrumentation using Nuvasive Reline Creo Instrumentation 2. Posterolateral fusion, L3-5 utilizing demineralized bone matrix 3. Use of Stereotaxis   PROCEDURE:  Patient was brought to the operating room, intubated, turned to the lateral position.  All pressure points were checked and double-checked.  The patient was prepped and draped in the standard fashion. Prior to prepping, fluoroscopy  was brought in and the patient was positioned with a large bump under the contralateral side between the iliac crest and rib cage, allowing the area between the iliac crest and the lateral aspect of the rib cage to open and increase the ability to reach inferiorly, to facilitate entry into the disc space.  The incision was marked upon the skin both the location of the disc space as well as the superior most aspect of the iliac crest.  Based on the identification of the disc space an incision was prepared, marked upon the skin and eventually was used for our lateral incision.  The fluoroscopy was turned into a cross table A/P image in order to confirm that the patient's spine remained in a perpendicular trajectory to the floor without rotation.  Once confirming that all the pressure points were checked and double-checked and the patient remained in sturdy position strapped down in this slightly jack-knifed lateral position, the patient was prepped and draped in standard fashion.  The skin was injected with local anesthetic, then incised until the abdominal wall fascia was noted.  I bluntly dissected posteriorly until we were able to identify the posterior musculature near petit's triangle.  At this point, using primarily blunt dissection with our finger aided with a metzenbaum scissor, were able to enter the retroperitoneal cavity.  The retroperitoneal potential space was opened further until palpating out the psoas muscle, the medial aspect of the iliac crest, the medial aspect of the last rib and continued to define the retroperitoneal space with blunt dissection in order to facilitate safe placement of our dilators.    While protecting by dissecting directly onto a finger in the retroperitoneum, the retroperitoneal space was entered safely from the lateral incision and the initial dilator placed onto the muscle belly of the psoas.  While directly stimulating the dilator and  after radiographically confirming  our location relative to the disc space, I placed the dilator through the psoas.  The dilators were stimulated to ensure remaining safely away from any of the lumbar plexus nerves; the dilators were repositioned until no pathologic stimulation was appreciated.  Once I had confirmed the location of our initial dilator radiographically, a K-wire secured the dilator into the L4/5 disc space and confirmed position under A/P and lateral fluoroscopy.  At this point, I dilated up with direct stimulation to confirm lack of pathologic stimulation.  Once all the dilators were in position, I placed in the retractor and secured it onto the table, locked into position and confirmed under A/P and lateral fluoroscopy to confirm our approach angle to the disc space as well as location relative to the disc space.  I then placed the muscle stimulator in through the working channel down to the vertebral body, stimulating the entire lateral surface of the vertebral body and any of the visualized psoas muscle that was adjacent to the retractor, confirming again the safe passage to the psoas before we began performing the discectomy.  At this point, we began our discectomy at L4/5.  The disc was incised laterally throughout the extent of our exposure. Using a combination of pituitary rongeurs, Kerrison rongeurs, rasps, curettes of various sorts, we were able to begin to clean out the disc space.  Once we had cleaned out the majority of the disc space, we then cut the lateral annulus with a cob, breaking the lateral annual attachments on the contralateral side by subtly working the cob through the annulus while using flouroscopy.  Care was taken not to extend further than required after cutting the annular attachments.  After this had been performed, we prepared the endplates for placement of our graft, sized a graft to the disc space by serially dilating up in trial sizes until we confirmed that our graft would be well positioned,  allowing distraction while maintaining good grip.  This was confirmed under A/P and lateral fluoroscopy in order to ensure its placement as an eventual trial for placement of our final graft.  We irrigated with bacteriostatic saline.  Once confirmed placement, the Hedron implant filled with allograft was impacted into position at L4/5.   Through a combination of intradiscal distraction and anterior releasing, we were able to correct the anterior deformity during disc preparation and placement of the graft.  After performing the lateral lumbar interbody procedure at L4/5, attention was moved to the L3/4 level.   While protecting by dissecting directly onto a finger in the retroperitoneum, the retroperitoneal space was entered safely from the lateral incision and the initial dilator placed onto the muscle belly of the psoas.  While directly stimulating the dilator and after radiographically confirming our location relative to the disc space, I placed the dilator through the psoas.  The dilators were stimulated to ensure remaining safely away from any of the lumbar plexus nerves; the dilators were repositioned until no pathologic stimulation was appreciated.  Once I had confirmed the location of our initial dilator radiographically, a K-wire secured the dilator into the L3/4 disc space and confirmed position under A/P and lateral fluoroscopy.  At this point, I dilated up with direct stimulation to confirm lack of pathologic stimulation.  Once all the dilators were in position, I placed in the retractor and secured it to the table, locked into position and confirmed under lateral fluoroscopy.  I then placed the muscle stimulator in through the working  channel down to the vertebral body, stimulating the entire lateral surface of the vertebral body and any of the visualized psoas muscle that was adjacent to the retractor, confirming again the safe passage to the psoas before we began performing the discectomy.   At this point, we began our discectomy at L3/4.  The disc was incised laterally throughout the extent of our exposure. Using a combination of pituitary rongeurs, Kerrison rongeurs, rasps, curettes of various sorts, we were able to begin to clean out the disc space.  Once we had cleaned out the majority of the disc space, we then cut the lateral annulus with a cob, breaking the lateral annual attachments on the contralateral side by subtly working the cob through the annulus while using flouroscopy.  Care was taken not to extend further than required after cutting the annular attachments.  After this had been performed, we prepared the endplates for placement of our graft, sized a graft to the disc space by serially dilating up in trial sizes until we confirmed that our graft would be well positioned, allowing distraction while maintaining good grip.  This was confirmed under A/P and lateral fluoroscopy in order to ensure its placement as an eventual trial for placement of our final graft.  We irrigated with bacteriostatic saline.  Once confirmed placement, the Hedron graft was impacted into position at L3/4.     At this point, final radiographs were performed, and we began closure.  The wound was closed using 0 Vicryl interrupted suture in the fascia and 2-0 Vicryl inverted suture were placed in the subcutaneous tissue and dermis. 3-0 monocryl was used for final closure. Dermabond was used to close the skin.    After closing the anterior part in layers, the patient was repositioned into prone position.  All pressure points were checked and double-checked.  The posterior operative site was prepped and draped in standard fashion.  The stereotactic array was placed.  Stereotactic images were acquired using intraoperative CT scanning.  This was registered to the patient.  Using stereotaxis, screw trajectories were planned and incisions made.  The pedicles from L3 to L5 were cannulated bilaterally and K wires  used to secure the tracks.  We then utilized a stereotactic screwdriver to place pedicle screws from L3 to L5.  At L3 and L4, 6.5 x 50 mm screws were placed.  At L5, 6.5 x 55 mm screws were placed.  NuVasive reline pedicle screws were used.  Once the screws were placed, the screw extensions were then linked, a path was formed for the rod and a rod was utilized to connect the screws.  We then compressed, torqued / counter-torqued and removed the screw assembly. Once performed on each side, the C-arm was brought back and to take confirmatory CT scan showing appropriate placement of all instrumentation and anatomic alignment.    Posterolateral arthrodesis was performed at L3-L5 utilizing demineralized bone matrix.  Again we confirmed radiographically and began our closure.  The wound was closed using 0 Vicryl interrupted suture in the fascia, 2-0 Vicryl inverted suture were placed in the subcutaneous tissue and dermis. 3-0 monocryl was used for final closure. Dermabond was used to close the skin.    Needle, lap and all counts were correct at the end of the case.    There was no pathologic change in the neuromonitoring during the procedure.   Cooper Render PA assisted in the entire procedure. An assistant was required for this procedure due to the complexity.  The  assistant provided assistance in tissue manipulation and suction, and was required for the successful and safe performance of the procedure. I performed the critical portions of the procedure.   Meade Maw MD Neurosurgery

## 2022-07-04 NOTE — Progress Notes (Signed)
Progress Note  History: Kara [name] is a 77 y.o female presenting with symptomatic lumbar spondylolisthesis with stenosis and neurogenic claudication status post L3-5 XLIF and PSF  POD1: significant back pain this morning. Pt expresses this is different from her pre-op pain and feels like surgical pain.  Physical Exam: Vitals:   07/04/22 0419 07/04/22 0752  BP: (!) 146/59 (!) 143/56  Pulse: 89 85  Resp: 15 14  Temp: 97.9 F (36.6 C) 98.2 F (36.8 C)  SpO2: 100% 98%    AA Ox3. Getting up with therapy CNI  Strength: MAEW Incisions c/d/I with post-op bandages in place Data:  Other tests/results: none  Assessment/Plan:  Kara [name] is a 77 y.o with symptomatic spondylolisthesis status post L3-5 XLIF and posterior spinal fusion  - mobilize - pain control - DVT prophylaxis - PTOT; set up with [facility] pre-op  Cooper Render PA-C Department of Neurosurgery

## 2022-07-04 NOTE — Progress Notes (Signed)

## 2022-07-04 NOTE — Evaluation (Signed)
Occupational Therapy Evaluation Patient Details Name: Kara [name] MRN: [id number] DOB: 1945-12-03 Today's Date: 07/04/2022   History of Present Illness Kara [name] is a 77 y/o F here for spinal fusion (L3-5). Has PMH of DM2, IBS, DOE, CKD, DDD. She is retired.   Clinical Impression   Patient received for OT evaluation. See flowsheet below for details of function. Generally, patient requiring MIN A for bed mobility, CGA with RW for sidesteps, not able to perform functional mobility, and MIN-MAX A for ADLs. Patient will benefit from continued OT while in acute care. Pt is anticipating to d/c home, but given today's weakness and difficulty with ADLs and mobility, may require rehab.      Recommendations for follow up therapy are one component of a multi-disciplinary discharge planning process, led by the attending physician.  Recommendations may be updated based on patient status, additional functional criteria and insurance authorization.   Follow Up Recommendations  Skilled nursing-short term rehab (<3 hours/day)     Assistance Recommended at Discharge Frequent or constant Supervision/Assistance  Patient can return home with the following A lot of help with walking and/or transfers;A lot of help with bathing/dressing/bathroom;Assistance with cooking/housework;Direct supervision/assist for medications management;Assist for transportation;Help with stairs or ramp for entrance    Functional Status Assessment  Patient has had a recent decline in their functional status and demonstrates the ability to make significant improvements in function in a reasonable and predictable amount of time.  Equipment Recommendations  Other (comment) (to be determined)    Recommendations for Other Services       Precautions / Restrictions Precautions Precautions: Back Restrictions Weight Bearing Restrictions: No      Mobility Bed Mobility Overal bed mobility: Needs Assistance Bed Mobility:  Rolling, Sidelying to Sit Rolling: Min guard Sidelying to sit: Mod assist       General bed mobility comments: cues for log roll technique; extra time 2/2 pain and anxiety.    Transfers Overall transfer level: Needs assistance Equipment used: Rolling walker (2 wheels) Transfers: Sit to/from Stand Sit to Stand: Min assist           General transfer comment: lots of increased time and anxiety with sit to stand t/f      Balance Overall balance assessment: Needs assistance Sitting-balance support: Feet supported Sitting balance-Leahy Scale: Good     Standing balance support: Bilateral upper extremity supported, Reliant on assistive device for balance Standing balance-Leahy Scale: Poor                             ADL either performed or assessed with clinical judgement   ADL Overall ADL's : Needs assistance/impaired   Eating/Feeding Details (indicate cue type and reason): anticipate set up from seated only Grooming: Set up;Sitting;Oral care;Brushing hair Grooming Details (indicate cue type and reason): Pt having to complete with set up from seated; not safe to walk to sink and stand at sink for task today.   Upper Body Bathing Details (indicate cue type and reason): anticipate MOD (I) seated for sponge bath   Lower Body Bathing Details (indicate cue type and reason): anticipate MOD A   Upper Body Dressing Details (indicate cue type and reason): anticipate set up from seated   Lower Body Dressing Details (indicate cue type and reason): anticipate MOD-MAX A seated/stand   Toilet Transfer Details (indicate cue type and reason): anticipate SPT to [facility] only today; unable to ambulate; lots of cues needed for stand  to sit while at [place] Details (indicate cue type and reason): anticipate MOD A   Tub/Shower Transfer Details (indicate cue type and reason): unsafe at this time Functional mobility during ADLs: Min guard;Rolling walker  (2 wheels) (pt only able to sidestep today during session; unable to advance forward) General ADL Comments: Pt with significant limitations due to LE weakness, pain, and anxiety     Vision         Perception     Praxis      Pertinent Vitals/Pain Pain Assessment Pain Assessment: 0-10 Pain Score: 5  Pain Location: back; surgical pain; pt states that her pain is different now- not nerve pain that pt used to have pre-surgery. Pain Descriptors / Indicators: Aching Pain Intervention(s): Limited activity within patient's tolerance, Monitored during session     Hand Dominance     Extremity/Trunk Assessment Upper Extremity Assessment Upper Extremity Assessment: Generalized weakness;Overall [facility] for tasks assessed   Lower Extremity Assessment Lower Extremity Assessment: Defer to PT evaluation;Generalized weakness       Communication Communication Communication: No difficulties   Cognition Arousal/Alertness: Awake/alert Behavior During Therapy: WFL for tasks assessed/performed, Anxious Overall Cognitive Status: Within Functional Limits for tasks assessed                                 General Comments: Pt is pleasant and alert and oriented; appears anxious during session about mobility and pain (although reports that pain is improved from baseline). Needing blocking of knees due to anxiety about falling even when standing at EOB.     General Comments       Exercises     Shoulder Instructions      Home Living Family/patient expects to be discharged to:: Private residence Living Arrangements: Spouse/significant other Available Help at Discharge: Family;Available 24 hours/day Type of Home: House Home Access: Stairs to enter [employer] of Steps: 1 Entrance Stairs-Rails: None Home Layout: One level     Bathroom Shower/Tub: Teacher, early years/pre: Standard Bathroom Accessibility: Yes How Accessible: Accessible via walker Home  Equipment: Cane - single point;Grab bars - tub/shower;BSC/3in1;Rolling Walker (2 wheels)   Additional Comments: son nearby available PRN, husband at home 24/7 but has early dementia      Prior Functioning/Environment Prior Level of Function : Needs assist       Physical Assist : ADLs (physical);Mobility (physical)   ADLs (physical): Dressing;IADLs Mobility Comments: Using RW; pt denies falls; has been using RW since Sept hospital admission when she received RW. At baseline only able to walk about 20 feet and then had to rest/sit due to pain and fatigue. ADLs Comments: husband assists with LB dressing at baseline 2/2 pt being unable to reach feet. Pt sponge bathes every day and only gets into tub shower once per week 2/2 mobility deficits. Pt and husband are retired. Pt enjoys cooking, but is unable to do much of that due to dementia; husband is assisting with most housework and driving. Patient is nervous about husband being home by himself or navigating in the community alone. Pt sleeps in a recliner 2/2 back pain.        OT Problem List: Decreased strength;Decreased activity tolerance;Decreased range of motion;Impaired balance (sitting and/or standing)      OT Treatment/Interventions: Self-care/ADL training;Therapeutic exercise;Therapeutic activities    OT Goals(Current goals can be found in the care plan section) Acute Rehab OT  Goals Patient Stated Goal: go home OT Goal Formulation: With patient Time For Goal Achievement: 07/18/22 Potential to Achieve Goals: Good ADL Goals Pt Will Perform Grooming: with modified independence;standing Pt Will Perform Lower Body Bathing: with modified independence;sit to/from stand Pt Will Perform Lower Body Dressing: with modified independence;sit to/from stand Pt Will Transfer to Toilet: with modified independence;bedside commode Pt Will Perform Toileting - Clothing Manipulation and hygiene: with modified independence;sit to/from stand  OT  Frequency: Min 2X/week    Co-evaluation              AM-PAC OT "6 Clicks" Daily Activity     Outcome Measure Help from another person eating meals?: None Help from another person taking care of personal grooming?: A Little Help from another person toileting, which includes using toliet, bedpan, or urinal?: A Lot Help from another person bathing (including washing, rinsing, drying)?: A Lot Help from another person to put on and taking off regular upper body clothing?: A Little Help from another person to put on and taking off regular lower body clothing?: A Lot 6 Click Score: 16   End of Session Equipment Utilized During Treatment: Rolling walker (2 wheels) Nurse Communication: Mobility status  Activity Tolerance: Patient limited by pain;Patient tolerated treatment well;Other (comment) (limited by anxiety) Patient left: Other (comment) (seated EOB with PT in room)  OT Visit Diagnosis: Unsteadiness on feet (R26.81)                Time: [id number] OT Time Calculation (min): 30 min Charges:  OT General Charges $OT Visit: 1 Visit OT Evaluation $OT Eval Moderate Complexity: 1 Mod OT Treatments $Self Care/Home Management : 8-22 mins  Waymon Amato, MS, OTR/L  Vania Rea 07/04/2022, 10:09 AM

## 2022-07-04 NOTE — Evaluation (Signed)
Physical Therapy Evaluation Patient Details Name: Kara [name] MRN: [id number] DOB: 01/20/1946 Today's Date: 07/04/2022  History of Present Illness  Kara [name] is a 77 y/o F here for spinal fusion (L3-5). Has PMH of DM2, IBS, DOE, CKD, DDD. She is retired.  Clinical Impression  Pt is a pleasant 77 year old female who was admitted for spinal fusion of L3-L5. Pt is POD 1 at time of evaluation. Pt performs transfers with mod assist and ambulation with min assist and RW. Limited by pain and anxiety with mobility. Also report nausea with exertion, dissipates with rest. Pt demonstrates deficits with endurance/pain/mobility. Is far from baseline at this time. Would benefit from skilled PT to address above deficits and promote optimal return to PLOF; recommend transition to STR upon discharge from acute hospitalization.      Recommendations for follow up therapy are one component of a multi-disciplinary discharge planning process, led by the attending physician.  Recommendations may be updated based on patient status, additional functional criteria and insurance authorization.  Follow Up Recommendations Skilled nursing-short term rehab (<3 hours/day) Can patient physically be transported by private vehicle: No    Assistance Recommended at Discharge Frequent or constant Supervision/Assistance  Patient can return home with the following  A lot of help with walking and/or transfers;A lot of help with bathing/dressing/bathroom;Help with stairs or ramp for entrance    Equipment Recommendations None recommended by PT  Recommendations for Other Services       Functional Status Assessment Patient has had a recent decline in their functional status and demonstrates the ability to make significant improvements in function in a reasonable and predictable amount of time.     Precautions / Restrictions Precautions Precautions: Back;Fall Precaution Booklet Issued: No Restrictions Weight Bearing  Restrictions: No      Mobility  Bed Mobility               General bed mobility comments: received in sitting at EOB    Transfers Overall transfer level: Needs assistance Equipment used: Rolling walker (2 wheels) Transfers: Sit to/from Stand Sit to Stand: Mod assist           General transfer comment: heavy cues, increased time and anxious with all mobility. Once standing, heavy WBing on B UEs.    Ambulation/Gait Ambulation/Gait assistance: Min assist Gait Distance (Feet): 6 Feet Assistive device: Rolling walker (2 wheels) Gait Pattern/deviations: Step-to pattern       General Gait Details: very slow and cautious steps away from bed. Pt becomes very anxious and feels her legs giving out. Assisted pt in taking steps back to bed. Pt unable to tolerate further distances at this time.  Stairs            Wheelchair Mobility    Modified Rankin (Stroke Patients Only)       Balance Overall balance assessment: Needs assistance Sitting-balance support: Feet supported Sitting balance-Leahy Scale: Good     Standing balance support: Bilateral upper extremity supported, Reliant on assistive device for balance Standing balance-Leahy Scale: Poor                               Pertinent Vitals/Pain Pain Assessment Pain Assessment: 0-10 Pain Score: 5  Pain Location: back; surgical pain; pt states that her pain is different now- not nerve pain that pt used to have pre-surgery. Pain Descriptors / Indicators: Aching Pain Intervention(s): Limited activity within patient's tolerance, Premedicated before session, Relaxation  Home Living Family/patient expects to be discharged to:: Private residence Living Arrangements: Spouse/significant other Available Help at Discharge: Family;Available 24 hours/day Type of Home: House Home Access: Stairs to enter Entrance Stairs-Rails: None Entrance Stairs-Number of Steps: 1   Home Layout: One level Home  Equipment: Cane - single point;Grab bars - tub/shower;BSC/3in1;Rolling Walker (2 wheels) Additional Comments: son nearby available PRN, husband at home 24/7 but has early dementia    Prior Function Prior Level of Function : Needs assist       Physical Assist : ADLs (physical);Mobility (physical)   ADLs (physical): Dressing;IADLs Mobility Comments: Using RW; pt denies falls; has been using RW since Sept hospital admission when she received RW. At baseline only able to walk about 20 feet and then had to rest/sit due to pain and fatigue. ADLs Comments: husband assists with LB dressing at baseline 2/2 pt being unable to reach feet. Pt sponge bathes every day and only gets into tub shower once per week 2/2 mobility deficits. Pt and husband are retired. Pt enjoys cooking, but is unable to do much of that due to dementia; husband is assisting with most housework and driving. Patient is nervous about husband being home by himself or navigating in the community alone. Pt sleeps in a recliner 2/2 back pain.     Hand Dominance        Extremity/Trunk Assessment   Upper Extremity Assessment Upper Extremity Assessment: Generalized weakness (B UE grossly 3+/5)    Lower Extremity Assessment Lower Extremity Assessment: Generalized weakness (B LE grossly 3/5)       Communication   Communication: No difficulties  Cognition Arousal/Alertness: Awake/alert Behavior During Therapy: Anxious Overall Cognitive Status: Within Functional Limits for tasks assessed                                 General Comments: very anxious with all mobility. Hesitantly agreeable to participate        General Comments      Exercises Other Exercises Other Exercises: reviewed back precautions, performed LAQ and attempted alt marching. Unable to perform >5 reps. Other Exercises: encouraged to sit at EOB for med pass   Assessment/Plan    PT Assessment Patient needs continued PT services  PT Problem  List Decreased strength;Decreased activity tolerance;Decreased balance;Decreased mobility;Pain       PT Treatment Interventions DME instruction;Gait training;Stair training;Therapeutic exercise;Balance training    PT Goals (Current goals can be found in the Care Plan section)  Acute Rehab PT Goals Patient Stated Goal: to go home PT Goal Formulation: With patient Time For Goal Achievement: 07/18/22 Potential to Achieve Goals: Good    Frequency 7X/week     Co-evaluation               AM-PAC PT "6 Clicks" Mobility  Outcome Measure Help needed turning from your back to your side while in a flat bed without using bedrails?: A Lot Help needed moving from lying on your back to sitting on the side of a flat bed without using bedrails?: A Lot Help needed moving to and from a bed to a chair (including a wheelchair)?: A Lot Help needed standing up from a chair using your arms (e.g., wheelchair or bedside chair)?: A Little Help needed to walk in hospital room?: A Lot Help needed climbing 3-5 steps with a railing? : Total 6 Click Score: 12    End of Session   Activity Tolerance: Patient  limited by pain Patient left: in bed;with bed alarm set Nurse Communication: Mobility status PT Visit Diagnosis: Muscle weakness (generalized) (M62.81);Unsteadiness on feet (R26.81);Pain;Difficulty in walking, not elsewhere classified (R26.2) Pain - Right/Left:  (back) Pain - part of body:  (back)    Time: [id number] PT Time Calculation (min) (ACUTE ONLY): 18 min   Charges:   PT Evaluation $PT Eval Moderate Complexity: 1 Mod PT Treatments $Gait Training: 8-22 mins        Greggory Stallion, PT, DPT, GCS [phone]   Kara [name] 07/04/2022, 10:28 AM

## 2022-07-04 NOTE — TOC Progression Note (Signed)
Transition of Care [facility]) - Progression Note    Patient Details  Name: Kara [name] MRN: [id number] Date of Birth: 07/11/1945  Transition of Care [facility]) CM/SW [place], RN Phone Number: 07/04/2022, 1:18 PM  Clinical Narrative:    Spoke with the patient about DC planning and needs She stated she does not want to go to rehab but wants to go home with her husband, she reports that she has a rolling walker and a cane at home and stated that she does not need additional DME She is set up with [facility] for [facility]  Expected Discharge Plan: [place] Barriers to Discharge: Continued Medical Work up  Expected Discharge Plan and Services   Discharge Planning Services: CM Consult   Living arrangements for the past 2 months: Single Family Home                 DME Arranged: N/A DME Agency: NA       HH Arranged: PT [place] Agency: [place] Date [facility] Agency Contacted: 07/04/22 Time [place]: [phone] Representative spoke with at [place]: Meg   Social Determinants of Health (SDOH) Interventions SDOH Screenings   Food Insecurity: No Food Insecurity (07/03/2022)  Housing: Low Risk  (07/03/2022)  Transportation Needs: No Transportation Needs (07/03/2022)  Utilities: Not At Risk (07/03/2022)  Alcohol Screen: Low Risk  (01/31/2022)  Depression (PHQ2-9): Low Risk  (05/07/2022)  Financial Resource Strain: Low Risk  (10/03/2020)  Tobacco Use: Medium Risk (07/04/2022)    Readmission Risk Interventions     No data to display

## 2022-07-04 NOTE — Plan of Care (Signed)
  Problem: Nutritional: Goal: Maintenance of adequate nutrition will improve Outcome: Progressing   Problem: Education: Goal: Ability to verbalize activity precautions or restrictions will improve Outcome: Progressing   Problem: Activity: Goal: Will remain free from falls Outcome: Progressing   Problem: Skin Integrity: Goal: Will show signs of wound healing Outcome: Progressing   Problem: Clinical Measurements: Goal: Cardiovascular complication will be avoided Outcome: Progressing

## 2022-07-05 LAB — GLUCOSE, CAPILLARY
Glucose-Capillary: 192 mg/dL — ABNORMAL HIGH (ref 70–99)
Glucose-Capillary: 181 mg/dL — ABNORMAL HIGH (ref 70–99)
Glucose-Capillary: 187 mg/dL — ABNORMAL HIGH (ref 70–99)
Glucose-Capillary: 155 mg/dL — ABNORMAL HIGH (ref 70–99)

## 2022-07-05 MED ORDER — KETOROLAC TROMETHAMINE 15 MG/ML IJ SOLN
7.5000 mg | Freq: Once | INTRAMUSCULAR | Status: AC
  Administered 2022-07-05: 7.5 mg via INTRAVENOUS
  Filled 2022-07-05: qty 1

## 2022-07-05 NOTE — Progress Notes (Signed)
Physical Therapy Treatment Patient Details Name: Kara [name] MRN: [id number] DOB: 26-Mar-1946 Today's Date: 07/05/2022   History of Present Illness Trae Yurchak is a 77 y/o F here for spinal fusion (L3-5). Has PMH of DM2, IBS, DOE, CKD, DDD. She is retired.    PT Comments    Pt was pleasant and motivated to participate during the session and put forth good effort throughout. Pt making good progress towards goals.  Required cuing and only min A during log roll training/review.  Pt required no physical assistance with transfers or gait but did require min cuing for proper sequencing with each.  Pt reported RLE/hip pain/weakness during the session with MD aware per patient.  Pt was mildly antalgic on the RLE during gait but no noted instability or buckling.  Pt remains anxious regarding possible upgrade to discharge home vs SNF but acknowledges improvement in functional mobility with pt educated that will reassess during tomorrow's session.  Pt will benefit from PT services in a SNF setting upon discharge to safely address deficits listed in patient problem list for decreased caregiver assistance and eventual return to PLOF.     Recommendations for follow up therapy are one component of a multi-disciplinary discharge planning process, led by the attending physician.  Recommendations may be updated based on patient status, additional functional criteria and insurance authorization.  Follow Up Recommendations  Skilled nursing-short term rehab (<3 hours/day) Can patient physically be transported by private vehicle: No   Assistance Recommended at Discharge Frequent or constant Supervision/Assistance  Patient can return home with the following Help with stairs or ramp for entrance;A little help with walking and/or transfers;A little help with bathing/dressing/bathroom;Assistance with cooking/housework;Assist for transportation   Equipment Recommendations  None recommended by PT    Recommendations  for Other Services       Precautions / Restrictions Precautions Precautions: Back;Fall Precaution Booklet Issued: No Required Braces or Orthoses:  (none) Restrictions Weight Bearing Restrictions: No     Mobility  Bed Mobility Overal bed mobility: Needs Assistance Bed Mobility: Rolling, Sidelying to Sit Rolling: Min guard Sidelying to sit: Min assist       General bed mobility comments: Min A for BLE and trunk management during log roll training    Transfers Overall transfer level: Needs assistance Equipment used: Rolling walker (2 wheels) Transfers: Sit to/from Stand Sit to Stand: Min guard           General transfer comment: Mod verbal cues for sequencing    Ambulation/Gait Ambulation/Gait assistance: Min guard Gait Distance (Feet): 20 Feet Assistive device: Rolling walker (2 wheels) Gait Pattern/deviations: Step-through pattern, Decreased step length - right, Decreased step length - left Gait velocity: decreased     General Gait Details: Slow cadence with short B step length but steady with no overt LOB or buckling and with good sequencing with the RW   Stairs             Wheelchair Mobility    Modified Rankin (Stroke Patients Only)       Balance Overall balance assessment: Needs assistance Sitting-balance support: Feet supported Sitting balance-Leahy Scale: Good     Standing balance support: Bilateral upper extremity supported, Reliant on assistive device for balance, During functional activity Standing balance-Leahy Scale: Good                              Cognition Arousal/Alertness: Awake/alert Behavior During Therapy: WFL for tasks assessed/performed Overall Cognitive Status:  Within Functional Limits for tasks assessed                                          Exercises Total Joint Exercises Ankle Circles/Pumps: AROM, Strengthening, Both, 10 reps Quad Sets: Strengthening, Both, 10 reps Gluteal Sets:  Strengthening, Both, 10 reps Long Arc Quad: Strengthening, Both, 10 reps Marching in Standing: Strengthening, Both, 5 reps, Standing Other Exercises Other Exercises: Back precautions review Other Exercises: Log roll training/review    General Comments General comments (skin integrity, edema, etc.): Overall improved today; still concerns about d/c home (vs SNF), but pt adamant about d/c to home; if goes home, will require HHOT support and strong husband/son support for IADLs.      Pertinent Vitals/Pain Pain Assessment Pain Assessment: 0-10 Pain Score: 2  Pain Location: back and R hip/groin Pain Descriptors / Indicators: Aching, Sore Pain Intervention(s): Repositioned, Premedicated before session, Monitored during session    Home Living                          Prior Function            PT Goals (current goals can now be found in the care plan section) Progress towards PT goals: Progressing toward goals    Frequency    7X/week      PT Plan Current plan remains appropriate    Co-evaluation              AM-PAC PT "6 Clicks" Mobility   Outcome Measure  Help needed turning from your back to your side while in a flat bed without using bedrails?: A Little Help needed moving from lying on your back to sitting on the side of a flat bed without using bedrails?: A Little Help needed moving to and from a bed to a chair (including a wheelchair)?: A Little Help needed standing up from a chair using your arms (e.g., wheelchair or bedside chair)?: A Little Help needed to walk in hospital room?: A Little Help needed climbing 3-5 steps with a railing? : A Lot 6 Click Score: 17    End of Session Equipment Utilized During Treatment: Gait belt Activity Tolerance: Patient tolerated treatment well Patient left: in chair;with call bell/phone within reach;with chair alarm set;with nursing/sitter in room;with SCD's reapplied Nurse Communication: Mobility status PT Visit  Diagnosis: Muscle weakness (generalized) (M62.81);Unsteadiness on feet (R26.81);Pain;Difficulty in walking, not elsewhere classified (R26.2) Pain - Right/Left: Right Pain - part of body: Hip (R hip and back)     Time: [id number] PT Time Calculation (min) (ACUTE ONLY): 23 min  Charges:  $Gait Training: 8-22 mins $Therapeutic Exercise: 8-22 mins                    D. Scott Kacee Koren PT, DPT 07/05/22, 12:10 PM

## 2022-07-05 NOTE — Care Management Important Message (Signed)
Important Message  Patient Details  Name: CLARISSA OLLE MRN: [id number] Date of Birth: Dec 07, 1945   Medicare Important Message Given:  N/A - LOS <3 / Initial given by admissions     Dannette Barbara 07/05/2022, 1:42 PM

## 2022-07-05 NOTE — Progress Notes (Signed)
Occupational Therapy Treatment Patient Details Name: Kara [name] MRN: [id number] DOB: 1945-05-09 Today's Date: 07/05/2022   History of present illness Kara [name] is a 77 y/o F here for spinal fusion (L3-5). Has PMH of DM2, IBS, DOE, CKD, DDD. She is retired.   OT comments  Pt received semi-reclined in bed. Appearing in pain, but willing to work with OT on standing ADLs and BSC transfer. See flowsheet below for further details of session. Improved activity tolerance today despite presence of pain. Able to walk 8 feet to [facility] against the wall; standing grooming with CGA. Left semi-reclined in bed with all needs in reach.     Recommendations for follow up therapy are one component of a multi-disciplinary discharge planning process, led by the attending physician.  Recommendations may be updated based on patient status, additional functional criteria and insurance authorization.    Follow Up Recommendations  Skilled nursing-short term rehab (<3 hours/day)     Assistance Recommended at Discharge Frequent or constant Supervision/Assistance  Patient can return home with the following  A little help with walking and/or transfers;A lot of help with bathing/dressing/bathroom;Assistance with cooking/housework;Direct supervision/assist for medications management;Direct supervision/assist for financial management;Assist for transportation;Help with stairs or ramp for entrance   Equipment Recommendations  None recommended by OT (pt has a RW and BSC)    Recommendations for Other Services      Precautions / Restrictions Precautions Precautions: Back;Fall Precaution Booklet Issued: No Required Braces or Orthoses:  (none) Restrictions Weight Bearing Restrictions: No       Mobility Bed Mobility Overal bed mobility: Needs Assistance Bed Mobility: Rolling, Sidelying to Sit, Sit to Sidelying Rolling: Min guard Sidelying to sit: Min assist     Sit to sidelying: Mod assist General bed  mobility comments: Needs cues for bed mobility and log roll technique; decreased awareness of where her feet are (pt trying to do sidelying to sit before BIL feet are off the EOB).    Transfers Overall transfer level: Needs assistance Equipment used: Rolling walker (2 wheels) Transfers: Sit to/from Stand Sit to Stand: Min guard (with extra effort)           General transfer comment: Cues and encouragement; increased time and effort and pain     Balance Overall balance assessment: Needs assistance Sitting-balance support: Feet supported Sitting balance-Leahy Scale: Good     Standing balance support: Bilateral upper extremity supported, Single extremity supported, Reliant on assistive device for balance Standing balance-Leahy Scale: Fair Standing balance comment: Able to stand at the sink today and brush teeth; moments of BIL hand use on task and pt able to hold balance briefly.                           ADL either performed or assessed with clinical judgement   ADL Overall ADL's : Needs assistance/impaired Eating/Feeding: Set up;Sitting Eating/Feeding Details (indicate cue type and reason): ate some crackers at the EOB and took medications from nursing Grooming: Min guard;Standing;Oral care;Brushing hair Grooming Details (indicate cue type and reason): At sink today; continues to c/o pain, but able to complete task. Slight sway; improved with at least one hand on countertop for balance   Upper Body Bathing Details (indicate cue type and reason): anticipate MOD (I) seated for sponge bath   Lower Body Bathing Details (indicate cue type and reason): anticipate MOD A   Upper Body Dressing Details (indicate cue type and reason): anticipate set up from seated  Lower Body Dressing Details (indicate cue type and reason): anticipate MOD-MAX A seated/stand; pt stated her husband can assist Toilet Transfer: Min guard;BSC/3in1;Rolling walker (2 wheels);Cueing for  safety Toilet Transfer Details (indicate cue type and reason): Pt able to walk approx 8 feet from sink to [facility] and t/f with cues; reached back for arm rests prior to sitting. Sit to stand CGA and lots of extra effort and increase in pain, but no physical assist needed. Pt completed two toilet transfers today because the first time she couldn't go; then once back in bed stated she needed to urinate; able to SPT to [facility] [facility] next to bed and was able to urinate after a few minutes of sitting. Toileting- Water quality scientist and Hygiene: Min guard;Sit to/from stand Toileting - Clothing Manipulation Details (indicate cue type and reason): once pt stood with balance of BIL hands on RW, able to wipe vaginal area with toilet paper and one hand on RW for balance.   Tub/Shower Transfer Details (indicate cue type and reason): recommend sponge bathing only at this time   General ADL Comments: Improved activity tolerance today, although continues to c/o significant pain; MD aware and at bedside to examine as OT left.    Extremity/Trunk Assessment Upper Extremity Assessment Upper Extremity Assessment: Generalized weakness;Overall [facility] for tasks assessed   Lower Extremity Assessment Lower Extremity Assessment: Generalized weakness;Defer to PT evaluation        Vision       Perception     Praxis      Cognition Arousal/Alertness: Awake/alert Behavior During Therapy: [facility] for tasks assessed/performed Overall Cognitive Status: Within Functional Limits for tasks assessed                                 General Comments: Less anxious today; continues to have pain, but mobility improved today.        Exercises      Shoulder Instructions       General Comments Overall improved today; still concerns about d/c home (vs SNF), but pt adamant about d/c to home; if goes home, will require HHOT support and strong husband/son support for IADLs.    Pertinent Vitals/ Pain       Pain  Assessment Pain Assessment: 0-10 Pain Score: 8  Pain Location: back; surgical pain; pt states that her pain is different now- not nerve pain that pt used to have pre-surgery. Pain Descriptors / Indicators: Aching Pain Intervention(s): Limited activity within patient's tolerance, Premedicated before session  Home Living                                          Prior Functioning/Environment              Frequency  Min 2X/week        Progress Toward Goals  OT Goals(current goals can now be found in the care plan section)  Progress towards OT goals: Progressing toward goals  Acute Rehab OT Goals Patient Stated Goal: go home OT Goal Formulation: With patient Time For Goal Achievement: 07/18/22 Potential to Achieve Goals: Good ADL Goals Pt Will Perform Grooming: with modified independence;standing Pt Will Perform Lower Body Bathing: with modified independence;sit to/from stand Pt Will Perform Lower Body Dressing: with modified independence;sit to/from stand Pt Will Transfer to Toilet: with modified independence;bedside commode Pt Will Perform Toileting -  Clothing Manipulation and hygiene: with modified independence;sit to/from stand  Plan Discharge plan remains appropriate    Co-evaluation                 AM-PAC OT "6 Clicks" Daily Activity     Outcome Measure   Help from another person eating meals?: None Help from another person taking care of personal grooming?: A Little Help from another person toileting, which includes using toliet, bedpan, or urinal?: A Little Help from another person bathing (including washing, rinsing, drying)?: A Little Help from another person to put on and taking off regular upper body clothing?: None Help from another person to put on and taking off regular lower body clothing?: A Lot 6 Click Score: 19    End of Session Equipment Utilized During Treatment: Rolling walker (2 wheels)  OT Visit Diagnosis:  Unsteadiness on feet (R26.81)   Activity Tolerance Patient limited by pain;Patient tolerated treatment well   Patient Left in bed;with bed alarm set;with call bell/phone within reach;Other (comment);with family/visitor present (husband and MD in room)   Nurse Communication Mobility status        Time: [phone] OT Time Calculation (min): 62 min  Charges: OT General Charges $OT Visit: 1 Visit OT Treatments $Self Care/Home Management : 53-67 mins  Waymon Amato, MS, OTR/L  Vania Rea 07/05/2022, 9:50 AM

## 2022-07-05 NOTE — Progress Notes (Addendum)
Progress Note  History: Kara [name] is a 77 y.o female presenting with symptomatic lumbar spondylolisthesis with stenosis and neurogenic claudication status post L3-5 XLIF and PSF  POD2: Continued back pain pt admits to significant improvement in her pre-op leg pain POD1: significant back pain this morning. Pt expresses this is different from her pre-op pain and feels like surgical pain.  Physical Exam: Vitals:   07/04/22 1526 07/04/22 2334  BP: (!) 134/52 (!) 124/46  Pulse: 87 96  Resp: 13 18  Temp: 98 F (36.7 C) 98.2 F (36.8 C)  SpO2: 97% 94%    AA Ox3. Getting up with therapy CNI  Strength: 5/5 throughout BLE Incisions c/d/I with post-op bandages in place  Data:  Other tests/results: none  Assessment/Plan:  Kara [name] is a 77 y.o with symptomatic spondylolisthesis status post L3-5 XLIF and posterior spinal fusion  - mobilize - pain control - DVT prophylaxis - PTOT; set up with [facility] pre-op  Cooper Render PA-C Department of Neurosurgery

## 2022-07-05 NOTE — TOC Progression Note (Signed)
Transition of Care [facility]) - Progression Note    Patient Details  Name: Kara [name] MRN: [id number] Date of Birth: [birth year]  Transition of Care [facility]) CM/SW [place], RN Phone Number: 07/05/2022, 9:29 AM  Clinical Narrative:    The patient remains in the hospital and in pain, she is still not wanting to go to rehab and plans to go home with [facility]   Expected Discharge Plan: [place] Barriers to Discharge: Continued Medical Work up  Expected Discharge Plan and Services   Discharge Planning Services: CM Consult   Living arrangements for the past 2 months: Single Family Home                 DME Arranged: N/A DME Agency: NA       HH Arranged: PT HH Agency: [place] Date HH Agency Contacted: 07/04/22 Time [place]: 1317 Representative spoke with at [place]: Meg   Social Determinants of Health ([place]) Interventions SDOH Screenings   Food Insecurity: No Food Insecurity (07/03/2022)  Housing: Low Risk  (07/03/2022)  Transportation Needs: No Transportation Needs (07/03/2022)  Utilities: Not At Risk (07/03/2022)  Alcohol Screen: Low Risk  (01/31/2022)  Depression (PHQ2-9): Low Risk  (05/07/2022)  Financial Resource Strain: Low Risk  (10/03/2020)  Tobacco Use: Medium Risk (07/04/2022)    Readmission Risk Interventions     No data to display

## 2022-07-06 LAB — GLUCOSE, CAPILLARY
Glucose-Capillary: 153 mg/dL — ABNORMAL HIGH (ref 70–99)
Glucose-Capillary: 173 mg/dL — ABNORMAL HIGH (ref 70–99)
Glucose-Capillary: 169 mg/dL — ABNORMAL HIGH (ref 70–99)
Glucose-Capillary: 196 mg/dL — ABNORMAL HIGH (ref 70–99)

## 2022-07-06 NOTE — Progress Notes (Signed)
Physical Therapy Treatment Patient Details Name: Kara [name] MRN: [id number] DOB: 1945-08-28 Today's Date: 07/06/2022   History of Present Illness Kara [name] is a 77 y/o F here for spinal fusion (L3-5). Has PMH of DM2, IBS, DOE, CKD, DDD. She is retired.    PT Comments    Pain much improved this date with increased ability to tolerate functional mobility. Pt able to stand from recliner with Supervision, gait training completed with RW, 180ft, with supervision and slow steady cadence. Pt ModI for use of bathroom and generally feels she will be able to tolerate returning home tomorrow if pain remains well controled. Discussed proper car transfer technique and benefits of using her cold pack at home for 20 minute intervals. Continue PT per POC in acute setting. Pt has a RW and BSC at home.   Recommendations for follow up therapy are one component of a multi-disciplinary discharge planning process, led by the attending physician.  Recommendations may be updated based on patient status, additional functional criteria and insurance authorization.  Follow Up Recommendations  Skilled nursing-short term rehab (<3 hours/day) Can patient physically be transported by private vehicle: No   Assistance Recommended at Discharge Intermittent Supervision/Assistance  Patient can return home with the following A little help with walking and/or transfers;Help with stairs or ramp for entrance;Assist for transportation   Equipment Recommendations  None recommended by PT    Recommendations for Other Services       Precautions / Restrictions Precautions Precautions: Back;Fall Restrictions Weight Bearing Restrictions: No     Mobility  Bed Mobility               General bed mobility comments:  (in chair pre/post)    Transfers Overall transfer level: Needs assistance Equipment used: Rolling walker (2 wheels) Transfers: Sit to/from Stand Sit to Stand: Supervision           General  transfer comment: Pt able to stand from toilet in bathroom with ModI    Ambulation/Gait Ambulation/Gait assistance: Supervision Gait Distance (Feet): 100 Feet Assistive device: Rolling walker (2 wheels) Gait Pattern/deviations: Step-through pattern, Decreased step length - right, Decreased step length - left Gait velocity: decreased     General Gait Details: Slow cadence with short B step length but steady with no overt LOB or buckling and with good sequencing with the RW   Stairs Stairs: Yes (Pt has a single small threshold step to enter home. She has been negotiating this with her RW for months and feels comfortable not practicing here now that function has improved)           Wheelchair Mobility    Modified Rankin (Stroke Patients Only)       Balance Overall balance assessment: Needs assistance Sitting-balance support: Feet supported Sitting balance-Leahy Scale: Normal     Standing balance support: Bilateral upper extremity supported, Reliant on assistive device for balance, During functional activity Standing balance-Leahy Scale: Good Standing balance comment:  (Able to stand at sink to wash hands without support of RW)                            Cognition Arousal/Alertness: Awake/alert Behavior During Therapy: WFL for tasks assessed/performed Overall Cognitive Status: Within Functional Limits for tasks assessed                                 General Comments: Pain level  improved, no anxiety noted        Exercises Total Joint Exercises Ankle Circles/Pumps: AROM, Strengthening, Both, 10 reps Long Arc Quad: Strengthening, Both, 10 reps    General Comments General comments (skin integrity, edema, etc.): Surgery dressing intact, education provided regarding review of single step negotiation and car transfers with height of passenger seat raised      Pertinent Vitals/Pain Pain Assessment Pain Assessment: 0-10 Pain Score: 4  Pain  Location: back and R hip Pain Descriptors / Indicators: Aching, Sore (5 hours since last pain med) Pain Intervention(s): Limited activity within patient's tolerance    Home Living                          Prior Function            PT Goals (current goals can now be found in the care plan section) Acute Rehab PT Goals Patient Stated Goal: to go home Progress towards PT goals: Progressing toward goals    Frequency    7X/week      PT Plan Current plan remains appropriate    Co-evaluation              AM-PAC PT "6 Clicks" Mobility   Outcome Measure  Help needed turning from your back to your side while in a flat bed without using bedrails?: A Little Help needed moving from lying on your back to sitting on the side of a flat bed without using bedrails?: A Little Help needed moving to and from a bed to a chair (including a wheelchair)?: A Little Help needed standing up from a chair using your arms (e.g., wheelchair or bedside chair)?: A Little Help needed to walk in hospital room?: A Little Help needed climbing 3-5 steps with a railing? : A Little 6 Click Score: 18    End of Session Equipment Utilized During Treatment: Gait belt Activity Tolerance: Patient tolerated treatment well Patient left: in chair;with call bell/phone within reach;with chair alarm set;with family/visitor present Nurse Communication: Mobility status PT Visit Diagnosis: Muscle weakness (generalized) (M62.81);Unsteadiness on feet (R26.81);Pain;Difficulty in walking, not elsewhere classified (R26.2) Pain - Right/Left: Right Pain - part of body: Hip (and Back)     Time: [id number] PT Time Calculation (min) (ACUTE ONLY): 32 min  Charges:  $Gait Training: 8-22 mins $Therapeutic Exercise: 8-22 mins                    Mikel Cella, PTA  Josie Dixon 07/06/2022, 2:59 PM

## 2022-07-06 NOTE — Progress Notes (Signed)
Progress Note  History: ESCARLETT STEGE is a 77 y.o female presenting with symptomatic lumbar spondylolisthesis with stenosis and neurogenic claudication status post L3-5 XLIF and PSF  POD3: Patient with no leg pain today but severe back pain. She has tolerated a diet.  Worked with PT  POD2: Continued back pain pt admits to significant improvement in her pre-op leg pain POD1: significant back pain this morning. Pt expresses this is different from her pre-op pain and feels like surgical pain.  Physical Exam: Vitals:   07/06/22 0027 07/06/22 0811  BP: (!) 140/55 (!) 149/85  Pulse: 87 92  Resp: 19 16  Temp: 98.2 F (36.8 C) 98.3 F (36.8 C)  SpO2: 97% 98%    AA Ox3. Getting up with therapy CNI  Strength: 5/5 throughout BLE Incisions c/d/I with post-op bandages in place  Data:  Other tests/results: none  Assessment/Plan:  EUNIQUA KALISTA is a 77 y.o with symptomatic spondylolisthesis status post L3-5 XLIF and posterior spinal fusion  - mobilize - pain control - DVT prophylaxis - PTOT; set up with [facility] pre-op  Deetta Perla, MD Department of Neurosurgery

## 2022-07-07 LAB — GLUCOSE, CAPILLARY: Glucose-Capillary: 151 mg/dL — ABNORMAL HIGH (ref 70–99)

## 2022-07-07 MED ORDER — SENNA 8.6 MG PO TABS: 1.0000 | ORAL_TABLET | Freq: Two times a day (BID) | ORAL | 0 refills | Status: DC

## 2022-07-07 MED ORDER — OXYCODONE HCL 5 MG PO TABS: 5.0000 mg | ORAL_TABLET | Freq: Four times a day (QID) | ORAL | 0 refills | Status: DC | PRN

## 2022-07-07 NOTE — Discharge Instructions (Addendum)
NEUROSURGERY DISCHARGE INSTRUCTIONS  Admission diagnosis: S/P spinal fusion [Z98.1]  Operative procedure: L3-5 Fusion  What to do after you leave the hospital:  Recommended diet: regular diet. Increase protein intake to promote wound healing.  Recommended activity: no lifting, driving, or strenuous exercise for 6 weeks . You should walk multiple times per day  Special Instructions  No straining, no heavy lifting > 10lbs x 6 weeks.  Keep incision areas clean and dry. May shower tomorrow. No baths or pools for 6 weeks. Please remove dressing at home, no need to apply a bandage afterwards  You have no sutures to remove, the skin is closed with adhesive  Please take pain medications as directed. Take a stool softener if on pain medications   Please Report any of the following: Nausea or Vomiting, Temperature is greater than 101.20F (38.1C) degrees, Dizziness, Abdominal Pain, Difficulty Breathing or Shortness of Breath, Inability to Eat, drink Fluids, or Take medications, Bleeding, swelling, or drainage from surgical incision sites, New numbness or weakness, and Bowel or bladder dysfunction to the neurosurgeon on call at [phone]  Additional Follow up appointments Please follow up with Dr Izora Ribas as scheduled    Please see below for scheduled appointments:  Future Appointments  Date Time Provider [place]  07/18/2022 10:30 AM Geronimo Boot, PA-C [facility] None  08/05/2022  9:40 AM Jonetta Osgood, NP [facility] None  08/13/2022  3:30 PM Lloyd Huger, MD [facility] None  08/15/2022 10:30 AM Meade Maw, MD [facility] None  09/24/2022 10:30 AM Geronimo Boot, PA-C [facility] None  10/17/2022 11:00 AM Jonetta Osgood, NP [facility] None

## 2022-07-07 NOTE — TOC Transition Note (Signed)
Transition of Care [facility]) - CM/SW Discharge Note   Patient Details  Name: Kara [name] MRN: [id number] Date of Birth: 1945/11/10  Transition of [facility]) CM/SW Contact:  Rebekah Chesterfield, LCSW Phone Number: 07/07/2022, 1:20 PM   Clinical Narrative:    CSW informed [facility] of pt discharge   Final next level of care: [place] Barriers to Discharge: Continued Medical Work up   Patient Goals and CMS Choice      Discharge Placement                         Discharge Plan and Services Additional resources added to the After Visit Summary for     Discharge Planning Services: CM Consult            DME Arranged: N/A DME Agency: NA       HH Arranged: PT HH Agency: [place] Date [place]: 07/04/22 Time [place]: [id number] Representative spoke with at [place]: [place] Determinants of Health ([place]) Interventions SDOH Screenings   Food Insecurity: No Food Insecurity (07/03/2022)  Housing: [place]  (07/03/2022)  Transportation Needs: No Transportation Needs (07/03/2022)  Utilities: Not At Risk (07/03/2022)  Alcohol Screen: Low Risk  (01/31/2022)  Depression (PHQ2-9): Low Risk  (05/07/2022)  Financial Resource Strain: Low Risk  (10/03/2020)  Tobacco Use: Medium Risk (07/04/2022)     Readmission Risk Interventions     No data to display

## 2022-07-07 NOTE — Discharge Summary (Signed)
Physician Discharge Summary  Patient ID: Kara [name] MRN: [id number] DOB/AGE: 76-Apr-1947 76 y.o.  Admit date: 07/03/2022 Discharge date: 07/07/2022  Admission Diagnoses:  Discharge Diagnoses:  Principal Problem:   S/P spinal fusion Active Problems:   Spinal instability, lumbar   Chronic bilateral low back pain with left-sided sciatica   Neurogenic claudication due to lumbar spinal stenosis   Spondylolisthesis of lumbar region   Discharged Condition: good  Hospital Course: Kara [name] was admitted for the lumbar fusion which was performed on 3/13.  There were no complications and she was taken to the floor for postoperative care.  There she did work with physical therapy and was ambulating safe enough for discharge home.  She did have some back pain postoperatively but her leg pain was improved.  On postop day 1-3, she continued to improve in terms of diet intake and pain control.  On 3/17, she was seen to be at her neurologic baseline with good control of her pain.  She was comfortable with discharge home at that time.   Discharge Exam: Blood pressure (!) 152/81, pulse 87, temperature 98.1 F (36.7 C), resp. rate 16, height 5' (1.524 m), weight 82.6 kg, SpO2 100 %. Neurologic exam 5-5 strength in bilateral lower extremities Sensation intact to light touch Dressings clean and dry  Disposition: Discharge disposition: 01-Home or Self Care     Home  Discharge Instructions     Diet - low sodium heart healthy   Complete by: As directed    Incentive spirometry RT   Complete by: As directed    Increase activity slowly   Complete by: As directed       Allergies as of 07/07/2022       Reactions   Amoxicillin Nausea And Vomiting        Medication List     STOP taking these medications    aspirin EC 81 MG tablet   oxyCODONE-acetaminophen 5-325 MG tablet Commonly known as: PERCOCET/ROXICET       TAKE these medications    alendronate 70 MG tablet Commonly  known as: FOSAMAX Take 1 tablet (70 mg total) by mouth once a week. Take with a full glass of water on an empty stomach.   CALCIUM 1200+D3 PO Take 1 tablet by mouth daily.   docusate sodium 100 MG capsule Commonly known as: COLACE Take 200 mg by mouth daily as needed.   doxycycline 100 MG capsule Commonly known as: MONODOX TAKE 1 CAPSULE (100 MG TOTAL) BY MOUTH 2 (TWO) TIMES DAILY. TAKE WITH FOOD.   Eylea 2 MG/0.05ML Soln Generic drug: Aflibercept   Ferrous Gluconate 324 (37.5 Fe) MG Tabs Take 324 mg by mouth daily.   furosemide 20 MG tablet Commonly known as: LASIX Take 2 tablets by mouth in the AM and take 1 tablet by mouth in the PM. May add 1 tablet to afternoon dose if swelling not improved.   Krill Oil 500 MG Caps Take 1 capsule by mouth daily.   Levemir FlexPen 100 UNIT/ML FlexPen Generic drug: insulin detemir INJECT UP TO 75 UNITS UNDER THE SKIN DAILY, TITRATE AS NEEDED. What changed: how much to take   methocarbamol 750 MG tablet Commonly known as: ROBAXIN Take 1 tablet (750 mg total) by mouth every 6 (six) hours as needed for muscle spasms.   Move Free Ultra Joint Health 40-5-3.3 MG Tabs Generic drug: Collagen-Boron-Hyaluronic Acid Take 1 tablet by mouth daily.   multivitamin tablet Take 1 tablet by mouth daily at 12 noon.  NovoLOG FlexPen 100 UNIT/ML FlexPen Generic drug: insulin aspart INJECT UNDER THE SKIN TWICE DAILY AS DIRECTED PER SLIDING SCALE. MAX DAILY DOSE IS 30 UNITS. What changed: Another medication with the same name was changed. Make sure you understand how and when to take each.   NovoLOG FlexPen 100 UNIT/ML FlexPen Generic drug: insulin aspart INJECT [place] 3 times daily with meals AS DIRECTED PER SLIDING SCALE. MAX DAILY DOSE IS 30 UNITS. DX E11.65. What changed:  how much to take when to take this   oxyCODONE 5 MG immediate release tablet Commonly known as: Oxy IR/ROXICODONE Take 1-2 tablets (5-10 mg total) by mouth every 6 (six) hours  as needed for moderate pain (take 5 mg for pain 4-7/10, 10mg  for 8-10/10).   pantoprazole 40 MG tablet Commonly known as: PROTONIX TAKE 1 TABLET BY MOUTH EVERY DAY   pregabalin 100 MG capsule Commonly known as: LYRICA TAKE 1 CAPSULE BY MOUTH TWICE A DAY   Probiotic Acidophilus Caps Take 1 capsule by mouth daily.   rosuvastatin 20 MG tablet Commonly known as: CRESTOR TAKE 1 TABLET BY MOUTH EVERYDAY AT BEDTIME   senna 8.6 MG Tabs tablet Commonly known as: SENOKOT Take 1 tablet (8.6 mg total) by mouth 2 (two) times daily.   tamoxifen 20 MG tablet Commonly known as: NOLVADEX TAKE 1 TABLET BY MOUTH EVERY DAY   Trulicity 1.5 0000000 Sopn Generic drug: Dulaglutide Inject 1.5 mg into the skin once a week. What changed: additional instructions         Signed: Deetta Perla 07/07/2022, 11:39 AM

## 2022-07-08 ENCOUNTER — Telehealth: Payer: Self-pay | Admitting: Nurse Practitioner

## 2022-07-08 DIAGNOSIS — E1140 Type 2 diabetes mellitus with diabetic neuropathy, unspecified: Secondary | ICD-10-CM | POA: Diagnosis not present

## 2022-07-08 DIAGNOSIS — Z4789 Encounter for other orthopedic aftercare: Secondary | ICD-10-CM | POA: Diagnosis not present

## 2022-07-08 DIAGNOSIS — Z794 Long term (current) use of insulin: Secondary | ICD-10-CM | POA: Diagnosis not present

## 2022-07-08 DIAGNOSIS — Z7985 Long-term (current) use of injectable non-insulin antidiabetic drugs: Secondary | ICD-10-CM | POA: Diagnosis not present

## 2022-07-08 DIAGNOSIS — Z981 Arthrodesis status: Secondary | ICD-10-CM | POA: Diagnosis not present

## 2022-07-08 DIAGNOSIS — E1122 Type 2 diabetes mellitus with diabetic chronic kidney disease: Secondary | ICD-10-CM | POA: Diagnosis not present

## 2022-07-08 DIAGNOSIS — E113599 Type 2 diabetes mellitus with proliferative diabetic retinopathy without macular edema, unspecified eye: Secondary | ICD-10-CM | POA: Diagnosis not present

## 2022-07-08 DIAGNOSIS — I129 Hypertensive chronic kidney disease with stage 1 through stage 4 chronic kidney disease, or unspecified chronic kidney disease: Secondary | ICD-10-CM | POA: Diagnosis not present

## 2022-07-08 DIAGNOSIS — N189 Chronic kidney disease, unspecified: Secondary | ICD-10-CM | POA: Diagnosis not present

## 2022-07-08 NOTE — Telephone Encounter (Signed)
Called patient to schedule HF. She stated she has follow up appointment with surgeon-Kara [name]

## 2022-07-10 DIAGNOSIS — Z794 Long term (current) use of insulin: Secondary | ICD-10-CM | POA: Diagnosis not present

## 2022-07-10 DIAGNOSIS — N189 Chronic kidney disease, unspecified: Secondary | ICD-10-CM | POA: Diagnosis not present

## 2022-07-10 DIAGNOSIS — Z4789 Encounter for other orthopedic aftercare: Secondary | ICD-10-CM | POA: Diagnosis not present

## 2022-07-10 DIAGNOSIS — I129 Hypertensive chronic kidney disease with stage 1 through stage 4 chronic kidney disease, or unspecified chronic kidney disease: Secondary | ICD-10-CM | POA: Diagnosis not present

## 2022-07-10 DIAGNOSIS — E113599 Type 2 diabetes mellitus with proliferative diabetic retinopathy without macular edema, unspecified eye: Secondary | ICD-10-CM | POA: Diagnosis not present

## 2022-07-10 DIAGNOSIS — Z981 Arthrodesis status: Secondary | ICD-10-CM | POA: Diagnosis not present

## 2022-07-10 DIAGNOSIS — Z7985 Long-term (current) use of injectable non-insulin antidiabetic drugs: Secondary | ICD-10-CM | POA: Diagnosis not present

## 2022-07-10 DIAGNOSIS — E1122 Type 2 diabetes mellitus with diabetic chronic kidney disease: Secondary | ICD-10-CM | POA: Diagnosis not present

## 2022-07-10 DIAGNOSIS — E1140 Type 2 diabetes mellitus with diabetic neuropathy, unspecified: Secondary | ICD-10-CM | POA: Diagnosis not present

## 2022-07-11 DIAGNOSIS — Z794 Long term (current) use of insulin: Secondary | ICD-10-CM | POA: Diagnosis not present

## 2022-07-11 DIAGNOSIS — Z981 Arthrodesis status: Secondary | ICD-10-CM | POA: Diagnosis not present

## 2022-07-11 DIAGNOSIS — Z7985 Long-term (current) use of injectable non-insulin antidiabetic drugs: Secondary | ICD-10-CM | POA: Diagnosis not present

## 2022-07-11 DIAGNOSIS — N189 Chronic kidney disease, unspecified: Secondary | ICD-10-CM | POA: Diagnosis not present

## 2022-07-11 DIAGNOSIS — Z4789 Encounter for other orthopedic aftercare: Secondary | ICD-10-CM | POA: Diagnosis not present

## 2022-07-11 DIAGNOSIS — E113599 Type 2 diabetes mellitus with proliferative diabetic retinopathy without macular edema, unspecified eye: Secondary | ICD-10-CM | POA: Diagnosis not present

## 2022-07-11 DIAGNOSIS — I129 Hypertensive chronic kidney disease with stage 1 through stage 4 chronic kidney disease, or unspecified chronic kidney disease: Secondary | ICD-10-CM | POA: Diagnosis not present

## 2022-07-11 DIAGNOSIS — E1140 Type 2 diabetes mellitus with diabetic neuropathy, unspecified: Secondary | ICD-10-CM | POA: Diagnosis not present

## 2022-07-11 DIAGNOSIS — E1122 Type 2 diabetes mellitus with diabetic chronic kidney disease: Secondary | ICD-10-CM | POA: Diagnosis not present

## 2022-07-12 DIAGNOSIS — E1140 Type 2 diabetes mellitus with diabetic neuropathy, unspecified: Secondary | ICD-10-CM | POA: Diagnosis not present

## 2022-07-12 DIAGNOSIS — N189 Chronic kidney disease, unspecified: Secondary | ICD-10-CM | POA: Diagnosis not present

## 2022-07-12 DIAGNOSIS — E113599 Type 2 diabetes mellitus with proliferative diabetic retinopathy without macular edema, unspecified eye: Secondary | ICD-10-CM | POA: Diagnosis not present

## 2022-07-12 DIAGNOSIS — E1122 Type 2 diabetes mellitus with diabetic chronic kidney disease: Secondary | ICD-10-CM | POA: Diagnosis not present

## 2022-07-12 DIAGNOSIS — Z4789 Encounter for other orthopedic aftercare: Secondary | ICD-10-CM | POA: Diagnosis not present

## 2022-07-12 DIAGNOSIS — I129 Hypertensive chronic kidney disease with stage 1 through stage 4 chronic kidney disease, or unspecified chronic kidney disease: Secondary | ICD-10-CM | POA: Diagnosis not present

## 2022-07-12 DIAGNOSIS — Z981 Arthrodesis status: Secondary | ICD-10-CM | POA: Diagnosis not present

## 2022-07-12 DIAGNOSIS — Z794 Long term (current) use of insulin: Secondary | ICD-10-CM | POA: Diagnosis not present

## 2022-07-12 DIAGNOSIS — Z7985 Long-term (current) use of injectable non-insulin antidiabetic drugs: Secondary | ICD-10-CM | POA: Diagnosis not present

## 2022-07-15 ENCOUNTER — Telehealth: Payer: Self-pay | Admitting: Neurosurgery

## 2022-07-15 DIAGNOSIS — Z794 Long term (current) use of insulin: Secondary | ICD-10-CM | POA: Diagnosis not present

## 2022-07-15 DIAGNOSIS — E1122 Type 2 diabetes mellitus with diabetic chronic kidney disease: Secondary | ICD-10-CM | POA: Diagnosis not present

## 2022-07-15 DIAGNOSIS — E1140 Type 2 diabetes mellitus with diabetic neuropathy, unspecified: Secondary | ICD-10-CM | POA: Diagnosis not present

## 2022-07-15 DIAGNOSIS — E113599 Type 2 diabetes mellitus with proliferative diabetic retinopathy without macular edema, unspecified eye: Secondary | ICD-10-CM | POA: Diagnosis not present

## 2022-07-15 DIAGNOSIS — N189 Chronic kidney disease, unspecified: Secondary | ICD-10-CM | POA: Diagnosis not present

## 2022-07-15 DIAGNOSIS — Z981 Arthrodesis status: Secondary | ICD-10-CM | POA: Diagnosis not present

## 2022-07-15 DIAGNOSIS — I129 Hypertensive chronic kidney disease with stage 1 through stage 4 chronic kidney disease, or unspecified chronic kidney disease: Secondary | ICD-10-CM | POA: Diagnosis not present

## 2022-07-15 DIAGNOSIS — Z4789 Encounter for other orthopedic aftercare: Secondary | ICD-10-CM | POA: Diagnosis not present

## 2022-07-15 DIAGNOSIS — Z7985 Long-term (current) use of injectable non-insulin antidiabetic drugs: Secondary | ICD-10-CM | POA: Diagnosis not present

## 2022-07-15 NOTE — Telephone Encounter (Signed)
Raquel Sarna from [facility] called following a visit she had with pt today Pain 10/10 when she first arrived but pt had just taken her muscle relaxer and pain medication.  During the session pt said the pain was a little better 7/10. Says pain felt deep in her back. Raquel Sarna reports that the Incision looks good and no falls. Pt has enough meds to make it to visit on thursday but will probably be out after that.

## 2022-07-15 NOTE — Telephone Encounter (Signed)
Noted. Marzetta Board made aware. We will see her as scheduled on Thursday.

## 2022-07-17 DIAGNOSIS — Z981 Arthrodesis status: Secondary | ICD-10-CM | POA: Diagnosis not present

## 2022-07-17 DIAGNOSIS — N189 Chronic kidney disease, unspecified: Secondary | ICD-10-CM | POA: Diagnosis not present

## 2022-07-17 DIAGNOSIS — I129 Hypertensive chronic kidney disease with stage 1 through stage 4 chronic kidney disease, or unspecified chronic kidney disease: Secondary | ICD-10-CM | POA: Diagnosis not present

## 2022-07-17 DIAGNOSIS — E1140 Type 2 diabetes mellitus with diabetic neuropathy, unspecified: Secondary | ICD-10-CM | POA: Diagnosis not present

## 2022-07-17 DIAGNOSIS — E1122 Type 2 diabetes mellitus with diabetic chronic kidney disease: Secondary | ICD-10-CM | POA: Diagnosis not present

## 2022-07-17 DIAGNOSIS — Z4789 Encounter for other orthopedic aftercare: Secondary | ICD-10-CM | POA: Diagnosis not present

## 2022-07-17 DIAGNOSIS — Z7985 Long-term (current) use of injectable non-insulin antidiabetic drugs: Secondary | ICD-10-CM | POA: Diagnosis not present

## 2022-07-17 DIAGNOSIS — Z794 Long term (current) use of insulin: Secondary | ICD-10-CM | POA: Diagnosis not present

## 2022-07-17 DIAGNOSIS — E113599 Type 2 diabetes mellitus with proliferative diabetic retinopathy without macular edema, unspecified eye: Secondary | ICD-10-CM | POA: Diagnosis not present

## 2022-07-17 NOTE — Progress Notes (Unsigned)
   REFERRING PHYSICIAN:  Levert Feinstein [address] [place],  [place] [address]  DOS: 07/03/22  L3-L5 XLIF and PSF   HISTORY OF PRESENT ILLNESS: Kara [name] is approximately 2 weeks status post L3-L5 XLIF and PSF. Was given robaxin and oxycodone on discharge from the hospital.   Her preop leg pain is gone! She notes some mild LBP and intermittent right groin pain. She is working with [facility].   She is taking prn oxycodone and is almost out. She is taking prn robaxin as well (prescribed by PCP).    PHYSICAL EXAMINATION:  General: Patient is well developed, well nourished, calm, collected, and in no apparent distress.   NEUROLOGICAL:  General: In no acute distress.   Awake, alert, oriented to person, place, and time.  Pupils equal round and reactive to light.  Facial tone is symmetric.     Strength:            Side Iliopsoas Quads Hamstring PF DF EHL  R 5 5 5 5 5 5   L 5 5 5 5 5 5    Incisions c/d/I  She ambulates with a walker.    ROS (Neurologic):  Negative except as noted above  IMAGING: Nothing new to review.   ASSESSMENT/PLAN:  Kara [name] is doing well s/p above surgery. Treatment options reviewed with patient and following plan made:   - I have advised the patient to lift up to 10 pounds until 6 weeks after surgery (follow up with Dr. Izora Ribas).  - Reviewed wound care.  - No bending, twisting, or lifting.  - Continue on current medications including prn oxycodone. Reviewed dosing and side effects. Refill sent to pharmacy. PMP reviewed and is appropriate.  - Continue prn robaxin. She has refills available from PCP.  - Follow up as scheduled in 4 weeks and prn.   Advised to contact the office if any questions or concerns arise.  Geronimo Boot PA-C Department of neurosurgery

## 2022-07-18 ENCOUNTER — Encounter: Payer: Self-pay | Admitting: Orthopedic Surgery

## 2022-07-18 ENCOUNTER — Ambulatory Visit (INDEPENDENT_AMBULATORY_CARE_PROVIDER_SITE_OTHER): Payer: Medicare HMO | Admitting: Orthopedic Surgery

## 2022-07-18 VITALS — BP 130/72 | Temp 99.3°F | Ht 60.0 in | Wt 182.0 lb

## 2022-07-18 DIAGNOSIS — M4316 Spondylolisthesis, lumbar region: Secondary | ICD-10-CM

## 2022-07-18 DIAGNOSIS — Z09 Encounter for follow-up examination after completed treatment for conditions other than malignant neoplasm: Secondary | ICD-10-CM

## 2022-07-18 DIAGNOSIS — Z981 Arthrodesis status: Secondary | ICD-10-CM

## 2022-07-18 DIAGNOSIS — M48062 Spinal stenosis, lumbar region with neurogenic claudication: Secondary | ICD-10-CM

## 2022-07-18 MED ORDER — OXYCODONE HCL 5 MG PO TABS: 5.0000 mg | ORAL_TABLET | Freq: Four times a day (QID) | ORAL | 0 refills | Status: DC | PRN

## 2022-07-19 ENCOUNTER — Telehealth: Payer: Self-pay | Admitting: Neurosurgery

## 2022-07-19 DIAGNOSIS — Z7985 Long-term (current) use of injectable non-insulin antidiabetic drugs: Secondary | ICD-10-CM | POA: Diagnosis not present

## 2022-07-19 DIAGNOSIS — N189 Chronic kidney disease, unspecified: Secondary | ICD-10-CM | POA: Diagnosis not present

## 2022-07-19 DIAGNOSIS — E113599 Type 2 diabetes mellitus with proliferative diabetic retinopathy without macular edema, unspecified eye: Secondary | ICD-10-CM | POA: Diagnosis not present

## 2022-07-19 DIAGNOSIS — Z981 Arthrodesis status: Secondary | ICD-10-CM | POA: Diagnosis not present

## 2022-07-19 DIAGNOSIS — Z794 Long term (current) use of insulin: Secondary | ICD-10-CM | POA: Diagnosis not present

## 2022-07-19 DIAGNOSIS — E1140 Type 2 diabetes mellitus with diabetic neuropathy, unspecified: Secondary | ICD-10-CM | POA: Diagnosis not present

## 2022-07-19 DIAGNOSIS — I129 Hypertensive chronic kidney disease with stage 1 through stage 4 chronic kidney disease, or unspecified chronic kidney disease: Secondary | ICD-10-CM | POA: Diagnosis not present

## 2022-07-19 DIAGNOSIS — E1122 Type 2 diabetes mellitus with diabetic chronic kidney disease: Secondary | ICD-10-CM | POA: Diagnosis not present

## 2022-07-19 DIAGNOSIS — Z4789 Encounter for other orthopedic aftercare: Secondary | ICD-10-CM | POA: Diagnosis not present

## 2022-07-19 NOTE — Telephone Encounter (Signed)
L3-5 XLIF/PSF on 07/03/22 Cherly Anderson from [facility] to request verbal extension for PT 2 x 2 1 x 1  Ok per Berdine Addison RN, [place] voiced understanding.

## 2022-07-22 ENCOUNTER — Other Ambulatory Visit: Payer: Self-pay | Admitting: Nurse Practitioner

## 2022-07-22 DIAGNOSIS — E113599 Type 2 diabetes mellitus with proliferative diabetic retinopathy without macular edema, unspecified eye: Secondary | ICD-10-CM | POA: Diagnosis not present

## 2022-07-22 DIAGNOSIS — N189 Chronic kidney disease, unspecified: Secondary | ICD-10-CM | POA: Diagnosis not present

## 2022-07-22 DIAGNOSIS — Z794 Long term (current) use of insulin: Secondary | ICD-10-CM | POA: Diagnosis not present

## 2022-07-22 DIAGNOSIS — Z981 Arthrodesis status: Secondary | ICD-10-CM | POA: Diagnosis not present

## 2022-07-22 DIAGNOSIS — E1122 Type 2 diabetes mellitus with diabetic chronic kidney disease: Secondary | ICD-10-CM | POA: Diagnosis not present

## 2022-07-22 DIAGNOSIS — E1142 Type 2 diabetes mellitus with diabetic polyneuropathy: Secondary | ICD-10-CM

## 2022-07-22 DIAGNOSIS — Z4789 Encounter for other orthopedic aftercare: Secondary | ICD-10-CM | POA: Diagnosis not present

## 2022-07-22 DIAGNOSIS — E1140 Type 2 diabetes mellitus with diabetic neuropathy, unspecified: Secondary | ICD-10-CM | POA: Diagnosis not present

## 2022-07-22 DIAGNOSIS — Z7985 Long-term (current) use of injectable non-insulin antidiabetic drugs: Secondary | ICD-10-CM | POA: Diagnosis not present

## 2022-07-22 DIAGNOSIS — I129 Hypertensive chronic kidney disease with stage 1 through stage 4 chronic kidney disease, or unspecified chronic kidney disease: Secondary | ICD-10-CM | POA: Diagnosis not present

## 2022-07-22 NOTE — Telephone Encounter (Signed)
Will send additional refills at her next office visit

## 2022-07-25 ENCOUNTER — Telehealth: Payer: Self-pay

## 2022-07-25 ENCOUNTER — Encounter: Payer: Self-pay | Admitting: Physician Assistant

## 2022-07-25 ENCOUNTER — Ambulatory Visit (INDEPENDENT_AMBULATORY_CARE_PROVIDER_SITE_OTHER): Payer: Medicare HMO | Admitting: Physician Assistant

## 2022-07-25 VITALS — BP 107/77 | HR 91 | Temp 98.3°F | Resp 16 | Ht 60.0 in | Wt 195.6 lb

## 2022-07-25 DIAGNOSIS — I129 Hypertensive chronic kidney disease with stage 1 through stage 4 chronic kidney disease, or unspecified chronic kidney disease: Secondary | ICD-10-CM | POA: Diagnosis not present

## 2022-07-25 DIAGNOSIS — E1140 Type 2 diabetes mellitus with diabetic neuropathy, unspecified: Secondary | ICD-10-CM | POA: Diagnosis not present

## 2022-07-25 DIAGNOSIS — Z981 Arthrodesis status: Secondary | ICD-10-CM | POA: Diagnosis not present

## 2022-07-25 DIAGNOSIS — Z7985 Long-term (current) use of injectable non-insulin antidiabetic drugs: Secondary | ICD-10-CM | POA: Diagnosis not present

## 2022-07-25 DIAGNOSIS — R600 Localized edema: Secondary | ICD-10-CM

## 2022-07-25 DIAGNOSIS — Z794 Long term (current) use of insulin: Secondary | ICD-10-CM | POA: Diagnosis not present

## 2022-07-25 DIAGNOSIS — E113599 Type 2 diabetes mellitus with proliferative diabetic retinopathy without macular edema, unspecified eye: Secondary | ICD-10-CM | POA: Diagnosis not present

## 2022-07-25 DIAGNOSIS — E1122 Type 2 diabetes mellitus with diabetic chronic kidney disease: Secondary | ICD-10-CM | POA: Diagnosis not present

## 2022-07-25 DIAGNOSIS — Z4789 Encounter for other orthopedic aftercare: Secondary | ICD-10-CM | POA: Diagnosis not present

## 2022-07-25 DIAGNOSIS — L120 Bullous pemphigoid: Secondary | ICD-10-CM | POA: Diagnosis not present

## 2022-07-25 DIAGNOSIS — N189 Chronic kidney disease, unspecified: Secondary | ICD-10-CM | POA: Diagnosis not present

## 2022-07-25 NOTE — Progress Notes (Signed)
[facility] [place], [place] [address]  Internal MEDICINE  Office Visit Note  Patient Name: Kara [name]  [id number]  [id number]  Date of Service: 07/25/2022  Chief Complaint  Patient presents with   Acute Visit   Leg Swelling    Bilateral swelling and blisters. Patient has gained 12 lbs in the last week.     HPI Pt is here for a sick visit. -Bullous pemphoid on legs, taking doxycycline for it and has ointment as well. This is known condition for her and has been on doxycycline for this for years. She will often get a flare q20months. Doesn't normally have swelling with this and thinks it is separate. -had surgery 3 weeks ago on her back and lost a little weight in the hosoital, but then last week both legs started swelling and has gained about 12 lbs in a few days. -has PT coming out and they suggested she may need more lasix -Taking lasix 1 tablet by mouth twice per day currently. Discussed increasing to 2 tabs in AM and 1 tab PM for swelling x3 days then reassess if continued swelling. Will update labs Monday due to increased dose. -No SOB other than if exerting herself with PT. Doesn't lay flat so unsure if any problem with SOB laying down. -Discussed if new or worsening symptoms to call or go to ED.  -Advised to look out for any redness or increased warmth as possible signs of infection that may require additional ABX  Current Medication:  Outpatient Encounter Medications as of 07/25/2022  Medication Sig Note   Aflibercept (EYLEA) 2 MG/0.05ML SOLN  12/22/2021: Every 3 months   alendronate (FOSAMAX) 70 MG tablet Take 1 tablet (70 mg total) by mouth once a week. Take with a full glass of water on an empty stomach.    Calcium-Magnesium-Vitamin D (CALCIUM 1200+D3 PO) Take 1 tablet by mouth daily.    Collagen-Boron-Hyaluronic Acid (MOVE FREE ULTRA JOINT HEALTH) 40-5-3.3 MG TABS Take 1 tablet by mouth daily.    docusate sodium (COLACE) 100 MG capsule Take 200  mg by mouth daily as needed.    doxycycline (MONODOX) 100 MG capsule TAKE 1 CAPSULE (100 MG TOTAL) BY MOUTH 2 (TWO) TIMES DAILY. TAKE WITH FOOD.    Dulaglutide (TRULICITY) 1.5 0000000 SOPN Inject 1.5 mg into the skin once a week. (Patient taking differently: Inject 1.5 mg into the skin once a week. Monday's)    Ferrous Gluconate 324 (37.5 Fe) MG TABS Take 324 mg by mouth daily.    furosemide (LASIX) 20 MG tablet Take 2 tablets by mouth in the AM and take 1 tablet by mouth in the PM. May add 1 tablet to afternoon dose if swelling not improved.    insulin aspart (NOVOLOG FLEXPEN) 100 UNIT/ML FlexPen INJECT [place] 3 times daily with meals AS DIRECTED PER SLIDING SCALE. MAX DAILY DOSE IS 30 UNITS. DX E11.65. (Patient taking differently: 4-14 Units 3 (three) times daily with meals. INJECT [place] 3 times daily with meals AS DIRECTED PER SLIDING SCALE. MAX DAILY DOSE IS 30 UNITS. DX E11.65.)    insulin aspart (NOVOLOG FLEXPEN) 100 UNIT/ML FlexPen INJECT UNDER THE SKIN TWICE DAILY AS DIRECTED PER SLIDING SCALE. MAX DAILY DOSE IS 30 UNITS.    insulin detemir (LEVEMIR FLEXPEN) 100 UNIT/ML FlexPen INJECT UP TO 75 UNITS UNDER THE SKIN DAILY, TITRATE AS NEEDED. (Patient taking differently: 25 Units. INJECT UP TO 75 UNITS UNDER THE SKIN DAILY, TITRATE AS NEEDED.)  Krill Oil 500 MG CAPS Take 1 capsule by mouth daily.    Lactobacillus (PROBIOTIC ACIDOPHILUS) CAPS Take 1 capsule by mouth daily.    methocarbamol (ROBAXIN) 750 MG tablet Take 1 tablet (750 mg total) by mouth every 6 (six) hours as needed for muscle spasms.    Multiple Vitamin (MULTIVITAMIN) tablet Take 1 tablet by mouth daily at 12 noon.    oxyCODONE (OXY IR/ROXICODONE) 5 MG immediate release tablet Take 1 tablet (5 mg total) by mouth every 6 (six) hours as needed for severe pain.    pantoprazole (PROTONIX) 40 MG tablet TAKE 1 TABLET BY MOUTH EVERY DAY    pregabalin (LYRICA) 100 MG capsule TAKE 1 CAPSULE BY MOUTH TWICE A DAY    rosuvastatin (CRESTOR) 20 MG  tablet TAKE 1 TABLET BY MOUTH EVERYDAY AT BEDTIME    senna (SENOKOT) 8.6 MG TABS tablet Take 1 tablet (8.6 mg total) by mouth 2 (two) times daily.    tamoxifen (NOLVADEX) 20 MG tablet TAKE 1 TABLET BY MOUTH EVERY DAY    No facility-administered encounter medications on file as of 07/25/2022.      Medical History: Past Medical History:  Diagnosis Date   Anemia    Arthropathy    Benign neoplasm of breast    Breast cancer    DCIS   Chest pain    CKD (chronic kidney disease), stage IV    Diabetes    Diabetic retinopathy    Dyspnea    Edema    History of kidney stones    Hyperlipemia    Hypersomnia with sleep apnea    Hypertension    Inflammatory and toxic neuropathy    Lumbago    Neuropathy    Osteoarthrosis    Osteoporosis    Ovarian failure    Personal history of radiation therapy    Pneumonia due to COVID-19 virus 12/21/2021   a.) required inpatient admission   SOB (shortness of breath)      Vital Signs: BP 107/77   Pulse 91   Temp 98.3 F (36.8 C)   Resp 16   Ht 5' (1.524 m)   Wt 195 lb 9.6 oz (88.7 kg)   SpO2 96%   BMI 38.20 kg/m    Review of Systems  Constitutional:  Positive for fatigue. Negative for fever.  HENT:  Negative for congestion, mouth sores and postnasal drip.   Respiratory:  Negative for cough and shortness of breath.   Cardiovascular:  Positive for leg swelling. Negative for chest pain and palpitations.  Genitourinary:  Negative for flank pain.  Musculoskeletal:  Positive for arthralgias, back pain and gait problem.  Skin:  Positive for rash.  Psychiatric/Behavioral: Negative.      Physical Exam Vitals and nursing note reviewed.  Constitutional:      Appearance: She is obese.  HENT:     Head: Normocephalic and atraumatic.  Cardiovascular:     Rate and Rhythm: Normal rate and regular rhythm.  Pulmonary:     Effort: Pulmonary effort is normal.     Breath sounds: Normal breath sounds.  Musculoskeletal:     Right lower leg: Edema  present.     Left lower leg: Edema present.     Comments: 2+ pitting edema bilateral lower extremities  Skin:    Findings: Rash present.     Comments: Bullous pemphigoid along several areas of legs. No increased warmth or erythema along lower extremities outside of small lesions.  Neurological:     Mental Status: She  is alert.       Assessment/Plan: 1. Bilateral lower extremity edema Will increase lasix to 2 tabs in AM and 1 tab in evening x3 days for swelling then may go back to 1 tab BID if improved. Will check labs for monitoring due to increased dose. Advised to elevated legs and monitor for any signs of cellulitis and to call if changes. Also advised to go to ED if SOB or acute worsening. Will monitor weight as well. - Comprehensive metabolic panel - CBC w/Diff/Platelet  2. Bullous pemphigoid Continue doxycycline as prescribed   General Counseling: Akeya verbalizes understanding of the findings of todays visit and agrees with plan of treatment. I have discussed any further diagnostic evaluation that may be needed or ordered today. We also reviewed her medications today. she has been encouraged to call the office with any questions or concerns that should arise related to todays visit.    Counseling:    Orders Placed This Encounter  Procedures   Comprehensive metabolic panel   CBC w/Diff/Platelet    No orders of the defined types were placed in this encounter.   Time spent:30 Minutes

## 2022-07-25 NOTE — Telephone Encounter (Signed)
[facility] Kara [name]:[id number] called that pt both leg swollen,warm to touch,redness and blister and burning sensation make pt appt today at 11:40 with lauren

## 2022-07-29 ENCOUNTER — Encounter: Payer: Self-pay | Admitting: Neurosurgery

## 2022-07-29 DIAGNOSIS — E113599 Type 2 diabetes mellitus with proliferative diabetic retinopathy without macular edema, unspecified eye: Secondary | ICD-10-CM | POA: Diagnosis not present

## 2022-07-29 DIAGNOSIS — Z794 Long term (current) use of insulin: Secondary | ICD-10-CM | POA: Diagnosis not present

## 2022-07-29 DIAGNOSIS — Z7985 Long-term (current) use of injectable non-insulin antidiabetic drugs: Secondary | ICD-10-CM | POA: Diagnosis not present

## 2022-07-29 DIAGNOSIS — E1140 Type 2 diabetes mellitus with diabetic neuropathy, unspecified: Secondary | ICD-10-CM | POA: Diagnosis not present

## 2022-07-29 DIAGNOSIS — I129 Hypertensive chronic kidney disease with stage 1 through stage 4 chronic kidney disease, or unspecified chronic kidney disease: Secondary | ICD-10-CM | POA: Diagnosis not present

## 2022-07-29 DIAGNOSIS — Z4789 Encounter for other orthopedic aftercare: Secondary | ICD-10-CM | POA: Diagnosis not present

## 2022-07-29 DIAGNOSIS — E1122 Type 2 diabetes mellitus with diabetic chronic kidney disease: Secondary | ICD-10-CM | POA: Diagnosis not present

## 2022-07-29 DIAGNOSIS — N189 Chronic kidney disease, unspecified: Secondary | ICD-10-CM | POA: Diagnosis not present

## 2022-07-29 DIAGNOSIS — Z981 Arthrodesis status: Secondary | ICD-10-CM | POA: Diagnosis not present

## 2022-07-29 DIAGNOSIS — R600 Localized edema: Secondary | ICD-10-CM | POA: Diagnosis not present

## 2022-07-30 ENCOUNTER — Telehealth: Payer: Self-pay

## 2022-07-30 LAB — COMPREHENSIVE METABOLIC PANEL
ALT: 9 IU/L (ref 0–32)
AST: 9 IU/L (ref 0–40)
Albumin/Globulin Ratio: 1.2 (ref 1.2–2.2)
Albumin: 3.3 g/dL — ABNORMAL LOW (ref 3.8–4.8)
Alkaline Phosphatase: 120 IU/L (ref 44–121)
BUN/Creatinine Ratio: 16 (ref 12–28)
BUN: 20 mg/dL (ref 8–27)
Bilirubin Total: 0.2 mg/dL (ref 0.0–1.2)
CO2: 27 mmol/L (ref 20–29)
Calcium: 8.7 mg/dL (ref 8.7–10.3)
Chloride: 101 mmol/L (ref 96–106)
Creatinine, Ser: 1.23 mg/dL — ABNORMAL HIGH (ref 0.57–1.00)
Globulin, Total: 2.7 g/dL (ref 1.5–4.5)
Glucose: 114 mg/dL — ABNORMAL HIGH (ref 70–99)
Potassium: 4 mmol/L (ref 3.5–5.2)
Sodium: 140 mmol/L (ref 134–144)
Total Protein: 6 g/dL (ref 6.0–8.5)
eGFR: 46 mL/min/{1.73_m2} — ABNORMAL LOW (ref >59)

## 2022-07-30 LAB — CBC WITH DIFFERENTIAL/PLATELET
Basophils Absolute: 0 10*3/uL (ref 0.0–0.2)
Basos: 1 %
EOS (ABSOLUTE): 0.3 10*3/uL (ref 0.0–0.4)
Eos: 6 %
Hematocrit: 31.4 % — ABNORMAL LOW (ref 34.0–46.6)
Hemoglobin: 9.5 g/dL — ABNORMAL LOW (ref 11.1–15.9)
Immature Grans (Abs): 0 10*3/uL (ref 0.0–0.1)
Immature Granulocytes: 0 %
Lymphocytes Absolute: 1.3 10*3/uL (ref 0.7–3.1)
Lymphs: 28 %
MCH: 27 pg (ref 26.6–33.0)
MCHC: 30.3 g/dL — ABNORMAL LOW (ref 31.5–35.7)
MCV: 89 fL (ref 79–97)
Monocytes Absolute: 0.6 10*3/uL (ref 0.1–0.9)
Monocytes: 13 %
Neutrophils Absolute: 2.4 10*3/uL (ref 1.4–7.0)
Neutrophils: 52 %
Platelets: 180 10*3/uL (ref 150–450)
RBC: 3.52 x10E6/uL — ABNORMAL LOW (ref 3.77–5.28)
RDW: 14.1 % (ref 11.7–15.4)
WBC: 4.6 10*3/uL (ref 3.4–10.8)

## 2022-07-30 NOTE — Telephone Encounter (Signed)
Spoke with patient regarding lab results. 

## 2022-07-30 NOTE — Telephone Encounter (Signed)
-----   Message from Carlean Jews, PA-C sent at 07/30/2022 12:27 PM EDT ----- Please let her know that labs look ok, renal function stable. Hemoglobin did drop some but ist overall consistent with prior readings back in sept. Will continue to monitor and she has appt next week with alyssa for follow up.

## 2022-07-31 DIAGNOSIS — Z981 Arthrodesis status: Secondary | ICD-10-CM | POA: Diagnosis not present

## 2022-07-31 DIAGNOSIS — Z7985 Long-term (current) use of injectable non-insulin antidiabetic drugs: Secondary | ICD-10-CM | POA: Diagnosis not present

## 2022-07-31 DIAGNOSIS — N189 Chronic kidney disease, unspecified: Secondary | ICD-10-CM | POA: Diagnosis not present

## 2022-07-31 DIAGNOSIS — Z4789 Encounter for other orthopedic aftercare: Secondary | ICD-10-CM | POA: Diagnosis not present

## 2022-07-31 DIAGNOSIS — E1140 Type 2 diabetes mellitus with diabetic neuropathy, unspecified: Secondary | ICD-10-CM | POA: Diagnosis not present

## 2022-07-31 DIAGNOSIS — E1122 Type 2 diabetes mellitus with diabetic chronic kidney disease: Secondary | ICD-10-CM | POA: Diagnosis not present

## 2022-07-31 DIAGNOSIS — I129 Hypertensive chronic kidney disease with stage 1 through stage 4 chronic kidney disease, or unspecified chronic kidney disease: Secondary | ICD-10-CM | POA: Diagnosis not present

## 2022-07-31 DIAGNOSIS — E113599 Type 2 diabetes mellitus with proliferative diabetic retinopathy without macular edema, unspecified eye: Secondary | ICD-10-CM | POA: Diagnosis not present

## 2022-07-31 DIAGNOSIS — Z794 Long term (current) use of insulin: Secondary | ICD-10-CM | POA: Diagnosis not present

## 2022-08-05 ENCOUNTER — Encounter: Payer: Self-pay | Admitting: Nurse Practitioner

## 2022-08-05 ENCOUNTER — Ambulatory Visit (INDEPENDENT_AMBULATORY_CARE_PROVIDER_SITE_OTHER): Payer: Medicare HMO | Admitting: Nurse Practitioner

## 2022-08-05 VITALS — BP 140/65 | HR 76 | Temp 97.2°F | Resp 16 | Ht 60.0 in | Wt 187.2 lb

## 2022-08-05 DIAGNOSIS — Z794 Long term (current) use of insulin: Secondary | ICD-10-CM

## 2022-08-05 DIAGNOSIS — L120 Bullous pemphigoid: Secondary | ICD-10-CM | POA: Diagnosis not present

## 2022-08-05 DIAGNOSIS — I10 Essential (primary) hypertension: Secondary | ICD-10-CM | POA: Diagnosis not present

## 2022-08-05 DIAGNOSIS — E1122 Type 2 diabetes mellitus with diabetic chronic kidney disease: Secondary | ICD-10-CM

## 2022-08-05 DIAGNOSIS — R600 Localized edema: Secondary | ICD-10-CM | POA: Diagnosis not present

## 2022-08-05 LAB — POCT GLYCOSYLATED HEMOGLOBIN (HGB A1C): Hemoglobin A1C: 6.5 % — AB (ref 4.0–5.6)

## 2022-08-05 NOTE — Progress Notes (Signed)
[facility] [address] [place], [place] [address]  Internal MEDICINE  Office Visit Note  Patient Name: Kara [name]  [id number]  [id number]  Date of Service: 08/05/2022  Chief Complaint  Patient presents with   Diabetes   Hyperlipidemia   Hypertension    HPI Kara [name] presents for a follow-up visit for diabetes, hypertension, and lower extremity edema.  Diabetes -- A1c significantly improved to 6.5. and have lost 8 lbs since last visit .  Lower extremity edema -- improved, still taking furosemide 2 tabs in the morning and 1 tab in the afternoon. Labs were ok.  Hypertension -- blood pressure is elevated, rechecked.  On doxycycline chronically for bullous pemphygoid.      Current Medication: Outpatient Encounter Medications as of 08/05/2022  Medication Sig Note   Aflibercept (EYLEA) 2 MG/0.05ML SOLN  12/22/2021: Every 3 months   alendronate (FOSAMAX) 70 MG tablet Take 1 tablet (70 mg total) by mouth once a week. Take with a full glass of water on an empty stomach.    Calcium-Magnesium-Vitamin D (CALCIUM 1200+D3 PO) Take 1 tablet by mouth daily.    Collagen-Boron-Hyaluronic Acid (MOVE FREE ULTRA JOINT HEALTH) 40-5-3.3 MG TABS Take 1 tablet by mouth daily.    docusate sodium (COLACE) 100 MG capsule Take 200 mg by mouth daily as needed.    doxycycline (MONODOX) 100 MG capsule TAKE 1 CAPSULE (100 MG TOTAL) BY MOUTH 2 (TWO) TIMES DAILY. TAKE WITH FOOD.    Dulaglutide (TRULICITY) 1.5 MG/0.5ML SOPN Inject 1.5 mg into the skin once a week. (Patient taking differently: Inject 1.5 mg into the skin once a week. Monday's)    Ferrous Gluconate 324 (37.5 Fe) MG TABS Take 324 mg by mouth daily.    furosemide (LASIX) 20 MG tablet Take 2 tablets by mouth in the AM and take 1 tablet by mouth in the PM. May add 1 tablet to afternoon dose if swelling not improved.    insulin aspart (NOVOLOG FLEXPEN) 100 UNIT/ML FlexPen INJECT [place] 3 times daily with meals AS DIRECTED PER SLIDING SCALE. MAX  DAILY DOSE IS 30 UNITS. DX E11.65. (Patient taking differently: 4-14 Units 3 (three) times daily with meals. INJECT [place] 3 times daily with meals AS DIRECTED PER SLIDING SCALE. MAX DAILY DOSE IS 30 UNITS. DX E11.65.)    insulin aspart (NOVOLOG FLEXPEN) 100 UNIT/ML FlexPen INJECT UNDER THE SKIN TWICE DAILY AS DIRECTED PER SLIDING SCALE. MAX DAILY DOSE IS 30 UNITS.    insulin detemir (LEVEMIR FLEXPEN) 100 UNIT/ML FlexPen INJECT UP TO 75 UNITS UNDER THE SKIN DAILY, TITRATE AS NEEDED. (Patient taking differently: 25 Units. INJECT UP TO 75 UNITS UNDER THE SKIN DAILY, TITRATE AS NEEDED.)    Krill Oil 500 MG CAPS Take 1 capsule by mouth daily.    Lactobacillus (PROBIOTIC ACIDOPHILUS) CAPS Take 1 capsule by mouth daily.    methocarbamol (ROBAXIN) 750 MG tablet Take 1 tablet (750 mg total) by mouth every 6 (six) hours as needed for muscle spasms.    Multiple Vitamin (MULTIVITAMIN) tablet Take 1 tablet by mouth daily at 12 noon.    oxyCODONE (OXY IR/ROXICODONE) 5 MG immediate release tablet Take 1 tablet (5 mg total) by mouth every 6 (six) hours as needed for severe pain.    pantoprazole (PROTONIX) 40 MG tablet TAKE 1 TABLET BY MOUTH EVERY DAY    pregabalin (LYRICA) 100 MG capsule TAKE 1 CAPSULE BY MOUTH TWICE A DAY    rosuvastatin (CRESTOR) 20 MG tablet TAKE 1 TABLET BY  MOUTH EVERYDAY AT BEDTIME    senna (SENOKOT) 8.6 MG TABS tablet Take 1 tablet (8.6 mg total) by mouth 2 (two) times daily.    tamoxifen (NOLVADEX) 20 MG tablet TAKE 1 TABLET BY MOUTH EVERY DAY    No facility-administered encounter medications on file as of 08/05/2022.    Surgical History: Past Surgical History:  Procedure Laterality Date   ANTERIOR LATERAL LUMBAR FUSION WITH PERCUTANEOUS SCREW 2 LEVEL N/A 07/03/2022   Procedure: L3-5 LATERAL LUMBAR INTERBODY FUSION WITH POSTERIOR SPINAL FUSION;  Surgeon: Venetia Night, MD;  Location: [facility];  Service: Neurosurgery;  Laterality: N/A;   APPLICATION OF INTRAOPERATIVE CT SCAN N/A  07/03/2022   Procedure: APPLICATION OF INTRAOPERATIVE CT SCAN;  Surgeon: Venetia Night, MD;  Location: [facility];  Service: Neurosurgery;  Laterality: N/A;   BILATERAL SALPINGECTOMY     BREAST BIOPSY Right 2014   neg- core   BREAST BIOPSY Right 12/21/2018   [place] bx, venus clip,  DUCTAL CARCINOMA IN SITU   BREAST BIOPSY Right 12/21/2018   [place] bx, ribbon clip,  FIBROEPITHELIAL PROLIFERATION WITH SCLEROSIS   BREAST BIOPSY Right 01/01/2019   Affirm bx "X" clip-path pending   BREAST DUCTAL SYSTEM EXCISION     BREAST EXCISIONAL BIOPSY Right 2014   neg   BREAST LUMPECTOMY Right 01/22/2019   path pending   CATARACT EXTRACTION     CHOLECYSTECTOMY     CYSTOSCOPY WITH STENT PLACEMENT Left 04/14/2015   Procedure: CYSTOSCOPY WITH STENT PLACEMENT;  Surgeon: Vanna Scotland, MD;  Location: [facility];  Service: Urology;  Laterality: Left;   EXTRACORPOREAL SHOCK WAVE LITHOTRIPSY Left 09/29/2014   Procedure: EXTRACORPOREAL SHOCK WAVE LITHOTRIPSY (ESWL);  Surgeon: Vanna Scotland, MD;  Location: [facility];  Service: Urology;  Laterality: Left;   EYE SURGERY     bilateral cataract   IRRIGATION AND DEBRIDEMENT ABSCESS Right 03/24/2019   Procedure: IRRIGATION AND DEBRIDEMENT ABSCESS RIGHT BREAST;  Surgeon: [name], MD;  Location: [facility];  Service: General;  Laterality: Right;   LAPAROSCOPIC OOPHERECTOMY Left    PARTIAL MASTECTOMY WITH NEEDLE LOCALIZATION Right 01/22/2019   Procedure: PARTIAL MASTECTOMY WITH NEEDLE LOCALIZATION;  Surgeon: [name], MD;  Location: [facility];  Service: General;  Laterality: Right;   TONSILLECTOMY     TUBAL LIGATION     URETEROSCOPY WITH HOLMIUM LASER LITHOTRIPSY Left 04/14/2015   Procedure: URETEROSCOPY WITH HOLMIUM LASER LITHOTRIPSY;  Surgeon: Vanna Scotland, MD;  Location: [facility];  Service: Urology;  Laterality: Left;    Medical History: Past Medical History:  Diagnosis Date   Anemia    Arthropathy    Benign neoplasm of breast    Breast cancer     DCIS   Chest pain    CKD (chronic kidney disease), stage IV    Diabetes    Diabetic retinopathy    Dyspnea    Edema    History of kidney stones    Hyperlipemia    Hypersomnia with sleep apnea    Hypertension    Inflammatory and toxic neuropathy    Lumbago    Neuropathy    Osteoarthrosis    Osteoporosis    Ovarian failure    Personal history of radiation therapy    Pneumonia due to COVID-19 virus 12/21/2021   a.) required inpatient admission   SOB (shortness of breath)     Family History: Family History  Problem Relation Age of Onset   Breast cancer Maternal Grandmother 75   Colon cancer Mother    Lung cancer Father  Social History   Socioeconomic History   Marital status: Married    Spouse name: Not on file   Number of children: Not on file   Years of education: Not on file   Highest education level: Not on file  Occupational History   Not on file  Tobacco Use   Smoking status: Former    Packs/day: 1.00    Years: 30.00    Additional pack years: 0.00    Total pack years: 30.00    Types: Cigarettes    Quit date: 04/11/1995    Years since quitting: 27.3   Smokeless tobacco: Never  Vaping Use   Vaping Use: Never used  Substance and Sexual Activity   Alcohol use: No    Alcohol/week: 0.0 standard drinks of alcohol   Drug use: No   Sexual activity: Not on file  Other Topics Concern   Not on file  Social History Narrative   Not on file   Social Determinants of Health   Financial Resource Strain: Low Risk  (10/03/2020)   Overall Financial Resource Strain (CARDIA)    Difficulty of Paying Living Expenses: Not very hard  Food Insecurity: No Food Insecurity (07/03/2022)   Hunger Vital Sign    Worried About Running Out of Food in the Last Year: Never true    Ran Out of Food in the Last Year: Never true  Transportation Needs: No Transportation Needs (07/03/2022)   PRAPARE - Administrator, Civil Service (Medical): No    Lack of Transportation  (Non-Medical): No  Physical Activity: Not on file  Stress: Not on file  Social Connections: Not on file  Intimate Partner Violence: Not At Risk (07/03/2022)   Humiliation, Afraid, Rape, and Kick questionnaire    Fear of Current or Ex-Partner: No    Emotionally Abused: No    Physically Abused: No    Sexually Abused: No      Review of Systems  Constitutional:  Positive for fatigue. Negative for chills and unexpected weight change.  HENT:  Negative for congestion, rhinorrhea, sneezing and sore throat.   Eyes:  Negative for redness.  Respiratory: Negative.  Negative for cough, chest tightness, shortness of breath and wheezing.   Cardiovascular: Negative.  Negative for chest pain and palpitations.  Gastrointestinal:  Negative for abdominal pain, constipation, diarrhea, nausea and vomiting.  Genitourinary:  Negative for dysuria and frequency.  Musculoskeletal:  Positive for arthralgias and gait problem (using cane). Negative for back pain, joint swelling and neck pain.  Skin:  Negative for rash.  Neurological:  Negative for tremors and numbness.  Hematological:  Negative for adenopathy. Does not bruise/bleed easily.  Psychiatric/Behavioral:  Negative for behavioral problems (Depression), sleep disturbance and suicidal ideas. The patient is not nervous/anxious.     Vital Signs: BP (!) 140/65 Comment: 151/61  Pulse 76   Temp (!) 97.2 F (36.2 C)   Resp 16   Ht 5' (1.524 m)   Wt 187 lb 3.2 oz (84.9 kg)   SpO2 95%   BMI 36.56 kg/m    Physical Exam Vitals reviewed.  Constitutional:      General: She is not in acute distress.    Appearance: Normal appearance. She is obese. She is ill-appearing.  HENT:     Head: Normocephalic and atraumatic.  Eyes:     Pupils: Pupils are equal, round, and reactive to light.  Cardiovascular:     Rate and Rhythm: Normal rate and regular rhythm.  Pulmonary:  Effort: Pulmonary effort is normal. No respiratory distress.  Neurological:      Mental Status: She is alert and oriented to person, place, and time.  Psychiatric:        Mood and Affect: Mood normal.        Behavior: Behavior normal.        Assessment/Plan: 1. Type 2 diabetes mellitus with chronic kidney disease, with long-term current use of insulin, unspecified CKD stage Continue current medications as prescribed, follow up in June as scheduled. Repeat metabolic panel in 1 month - POCT glycosylated hemoglobin (Hb A1C) - BMP8+1AC  2. Essential hypertension BP stable, continue medications as prescribed  3. Bilateral lower extremity edema Continue furosemide as prescribed. 2 tab in AM and 1 tab in PM. Repeat metabolic panel in 1 month - BMP8+1AC  4. Bullous pemphigoid Continue doxycycline as prescribed.  - XKG8+1EH   General Counseling: Cecille Amsterdam understanding of the findings of todays visit and agrees with plan of treatment. I have discussed any further diagnostic evaluation that may be needed or ordered today. We also reviewed her medications today. she has been encouraged to call the office with any questions or concerns that should arise related to todays visit.    Orders Placed This Encounter  Procedures   BMP8+1AC   POCT glycosylated hemoglobin (Hb A1C)    No orders of the defined types were placed in this encounter.   Return for lab in 1 month and , previously scheduled, Ismael Treptow PCP in late june .   Total time spent:30 Minutes Time spent includes review of chart, medications, test results, and follow up plan with the patient.   [place] Controlled Substance Database was reviewed by me.  This patient was seen by [name], FNP-C in collaboration with Dr. Beverely Risen as a part of collaborative care agreement.   Collie Kittel R. Tedd Sias, MSN, FNP-C Internal medicine

## 2022-08-05 NOTE — Patient Instructions (Signed)
A1C improved to 6.5  Lost 8 lbs since last visit on 4/4  BP ok, vitals ok  Continue furosemide 2 tabs in am and 1 tab in pm, check kidney function and potassium level at [facility] in 1 month

## 2022-08-07 DIAGNOSIS — Z981 Arthrodesis status: Secondary | ICD-10-CM | POA: Diagnosis not present

## 2022-08-07 DIAGNOSIS — Z794 Long term (current) use of insulin: Secondary | ICD-10-CM | POA: Diagnosis not present

## 2022-08-07 DIAGNOSIS — N189 Chronic kidney disease, unspecified: Secondary | ICD-10-CM | POA: Diagnosis not present

## 2022-08-07 DIAGNOSIS — Z4789 Encounter for other orthopedic aftercare: Secondary | ICD-10-CM | POA: Diagnosis not present

## 2022-08-07 DIAGNOSIS — E113599 Type 2 diabetes mellitus with proliferative diabetic retinopathy without macular edema, unspecified eye: Secondary | ICD-10-CM | POA: Diagnosis not present

## 2022-08-07 DIAGNOSIS — E1122 Type 2 diabetes mellitus with diabetic chronic kidney disease: Secondary | ICD-10-CM | POA: Diagnosis not present

## 2022-08-07 DIAGNOSIS — Z7985 Long-term (current) use of injectable non-insulin antidiabetic drugs: Secondary | ICD-10-CM | POA: Diagnosis not present

## 2022-08-07 DIAGNOSIS — I129 Hypertensive chronic kidney disease with stage 1 through stage 4 chronic kidney disease, or unspecified chronic kidney disease: Secondary | ICD-10-CM | POA: Diagnosis not present

## 2022-08-07 DIAGNOSIS — E1140 Type 2 diabetes mellitus with diabetic neuropathy, unspecified: Secondary | ICD-10-CM | POA: Diagnosis not present

## 2022-08-08 ENCOUNTER — Encounter: Payer: Self-pay | Admitting: Neurosurgery

## 2022-08-13 ENCOUNTER — Encounter: Payer: Self-pay | Admitting: Oncology

## 2022-08-13 ENCOUNTER — Inpatient Hospital Stay: Payer: Medicare HMO | Attending: Oncology | Admitting: Oncology

## 2022-08-13 DIAGNOSIS — D0511 Intraductal carcinoma in situ of right breast: Secondary | ICD-10-CM | POA: Diagnosis not present

## 2022-08-13 MED ORDER — TAMOXIFEN CITRATE 20 MG PO TABS: 20.0000 mg | ORAL_TABLET | Freq: Every day | ORAL | 3 refills | Status: DC

## 2022-08-13 NOTE — Progress Notes (Signed)
[name]  [phone]) [phone] [phone]  ID: Kara [name] OB: March 29, 1946  MR#: [id number]  EXB#:[id number]  Patient Care Team: [name], NP as PCP - General (Nurse Practitioner) [name], MD as Consulting Physician (Oncology) [name], MD as Consulting Physician (General Surgery) [name], MD as Referring Physician (Radiation Oncology) [name], [facility] (Inactive) as Pharmacist (Pharmacist)  I connected with Kara [name] on 08/13/22 at  3:30 PM EDT by video enabled telemedicine visit and verified that I am speaking with the correct person using two identifiers.   I discussed the limitations, risks, security and privacy concerns of performing an evaluation and management service by telemedicine and the availability of in-person appointments. I also discussed with the patient that there may be a patient responsible charge related to this service. The patient expressed understanding and agreed to proceed.   Other persons participating in the visit and their role in the encounter: Patient, MD.  Patient's location: Home. Provider's location: Clinic.  CHIEF COMPLAINT: Low-grade DCIS of the right breast.  INTERVAL HISTORY: Patient agreed to video assisted telemedicine visit for routine 35-month evaluation.  She recently underwent back surgery approximately 1 month ago, but is recovering and nearly back to her baseline.  She also gets intermittent treatment for her bullous pemphigoid.  She continues to tolerate tamoxifen without significant side effects. She has no neurologic complaints.  She denies any recent fevers or illnesses.  She has a good appetite and denies weight loss.  She has no chest pain, shortness of breath, cough, or hemoptysis.  She denies any nausea, vomiting, constipation, or diarrhea.  She has no urinary complaints.  Patient offers no further specific complaints today.  REVIEW OF SYSTEMS:    Review of Systems  Constitutional: Negative.  Negative for fever, malaise/fatigue and weight loss.  Respiratory: Negative.  Negative for cough, hemoptysis and shortness of breath.   Cardiovascular: Negative.  Negative for chest pain and leg swelling.  Gastrointestinal: Negative.  Negative for abdominal pain.  Genitourinary: Negative.  Negative for dysuria.  Musculoskeletal:  Positive for back pain. Negative for joint pain.  Skin:  Negative for itching and rash.  Neurological: Negative.  Negative for dizziness, focal weakness, weakness and headaches.  Psychiatric/Behavioral: Negative.  The patient is not nervous/anxious.     As per HPI. Otherwise, a complete review of systems is negative.  PAST MEDICAL HISTORY: Past Medical History:  Diagnosis Date   Anemia    Arthropathy    Benign neoplasm of breast    Breast cancer    DCIS   Chest pain    CKD (chronic kidney disease), stage IV    Diabetes    Diabetic retinopathy    Dyspnea    Edema    History of kidney stones    Hyperlipemia    Hypersomnia with sleep apnea    Hypertension    Inflammatory and toxic neuropathy    Lumbago    Neuropathy    Osteoarthrosis    Osteoporosis    Ovarian failure    Personal history of radiation therapy    Pneumonia due to COVID-19 virus 12/21/2021   a.) required inpatient admission   SOB (shortness of breath)     PAST SURGICAL HISTORY: Past Surgical History:  Procedure Laterality Date   ANTERIOR LATERAL LUMBAR FUSION WITH PERCUTANEOUS SCREW 2 LEVEL N/A 07/03/2022   Procedure: L3-5 LATERAL LUMBAR INTERBODY FUSION WITH POSTERIOR SPINAL FUSION;  Surgeon: Venetia Night, MD;  Location: [facility];  Service: Neurosurgery;  Laterality: N/A;   APPLICATION OF INTRAOPERATIVE CT SCAN N/A 07/03/2022   Procedure: APPLICATION OF INTRAOPERATIVE CT SCAN;  Surgeon: Venetia Night, MD;  Location: [facility];  Service: Neurosurgery;  Laterality: N/A;   BILATERAL SALPINGECTOMY     BREAST BIOPSY Right 2014    neg- core   BREAST BIOPSY Right 12/21/2018   [place] bx, venus clip,  DUCTAL CARCINOMA IN SITU   BREAST BIOPSY Right 12/21/2018   [place] bx, ribbon clip,  FIBROEPITHELIAL PROLIFERATION WITH SCLEROSIS   BREAST BIOPSY Right 01/01/2019   Affirm bx "X" clip-path pending   BREAST DUCTAL SYSTEM EXCISION     BREAST EXCISIONAL BIOPSY Right 2014   neg   BREAST LUMPECTOMY Right 01/22/2019   path pending   CATARACT EXTRACTION     CHOLECYSTECTOMY     CYSTOSCOPY WITH STENT PLACEMENT Left 04/14/2015   Procedure: CYSTOSCOPY WITH STENT PLACEMENT;  Surgeon: Vanna Scotland, MD;  Location: [facility];  Service: Urology;  Laterality: Left;   EXTRACORPOREAL SHOCK WAVE LITHOTRIPSY Left 09/29/2014   Procedure: EXTRACORPOREAL SHOCK WAVE LITHOTRIPSY (ESWL);  Surgeon: Vanna Scotland, MD;  Location: [facility];  Service: Urology;  Laterality: Left;   EYE SURGERY     bilateral cataract   IRRIGATION AND DEBRIDEMENT ABSCESS Right 03/24/2019   Procedure: IRRIGATION AND DEBRIDEMENT ABSCESS RIGHT BREAST;  Surgeon: [name], MD;  Location: [facility];  Service: General;  Laterality: Right;   LAPAROSCOPIC OOPHERECTOMY Left    PARTIAL MASTECTOMY WITH NEEDLE LOCALIZATION Right 01/22/2019   Procedure: PARTIAL MASTECTOMY WITH NEEDLE LOCALIZATION;  Surgeon: [name], MD;  Location: [facility];  Service: General;  Laterality: Right;   TONSILLECTOMY     TUBAL LIGATION     URETEROSCOPY WITH HOLMIUM LASER LITHOTRIPSY Left 04/14/2015   Procedure: URETEROSCOPY WITH HOLMIUM LASER LITHOTRIPSY;  Surgeon: Vanna Scotland, MD;  Location: [facility];  Service: Urology;  Laterality: Left;    FAMILY HISTORY: Family History  Problem Relation Age of Onset   Breast cancer Maternal Grandmother 105   Colon cancer Mother    Lung cancer Father     ADVANCED DIRECTIVES (Y/N):  N  HEALTH MAINTENANCE: Social History   Tobacco Use   Smoking status: Former    Packs/day: 1.00    Years: 30.00    Additional pack years: 0.00    Total  pack years: 30.00    Types: Cigarettes    Quit date: 04/11/1995    Years since quitting: 27.3   Smokeless tobacco: Never  Vaping Use   Vaping Use: Never used  Substance Use Topics   Alcohol use: No    Alcohol/week: 0.0 standard drinks of alcohol   Drug use: No     Colonoscopy:  PAP:  Bone density:  Lipid panel:  Allergies  Allergen Reactions   Amoxicillin Nausea And Vomiting    Current Outpatient Medications  Medication Sig Dispense Refill   Aflibercept (EYLEA) 2 MG/0.05ML SOLN      alendronate (FOSAMAX) 70 MG tablet Take 1 tablet (70 mg total) by mouth once a week. Take with a full glass of water on an empty stomach. 12 tablet 3   Calcium-Magnesium-Vitamin D (CALCIUM 1200+D3 PO) Take 1 tablet by mouth daily.     Collagen-Boron-Hyaluronic Acid (MOVE FREE ULTRA JOINT HEALTH) 40-5-3.3 MG TABS Take 1 tablet by mouth daily.     docusate sodium (COLACE) 100 MG capsule Take 200 mg by mouth daily as needed.     doxycycline (MONODOX) 100 MG capsule TAKE 1 CAPSULE (100 MG TOTAL) BY MOUTH  2 (TWO) TIMES DAILY. TAKE WITH FOOD. 60 capsule 2   Dulaglutide (TRULICITY) 1.5 MG/0.5ML SOPN Inject 1.5 mg into the skin once a week. (Patient taking differently: Inject 1.5 mg into the skin once a week. Monday's) 6 mL 4   Ferrous Gluconate 324 (37.5 Fe) MG TABS Take 324 mg by mouth daily.     furosemide (LASIX) 20 MG tablet Take 2 tablets by mouth in the AM and take 1 tablet by mouth in the PM. May add 1 tablet to afternoon dose if swelling not improved. 120 tablet 2   insulin aspart (NOVOLOG FLEXPEN) 100 UNIT/ML FlexPen INJECT [place] 3 times daily with meals AS DIRECTED PER SLIDING SCALE. MAX DAILY DOSE IS 30 UNITS. DX E11.65. (Patient taking differently: 4-14 Units 3 (three) times daily with meals. INJECT [place] 3 times daily with meals AS DIRECTED PER SLIDING SCALE. MAX DAILY DOSE IS 30 UNITS. DX E11.65.) 15 mL 3   insulin aspart (NOVOLOG FLEXPEN) 100 UNIT/ML FlexPen INJECT UNDER THE SKIN TWICE DAILY AS  DIRECTED PER SLIDING SCALE. MAX DAILY DOSE IS 30 UNITS. 9 mL 2   insulin detemir (LEVEMIR FLEXPEN) 100 UNIT/ML FlexPen INJECT UP TO 75 UNITS UNDER THE SKIN DAILY, TITRATE AS NEEDED. (Patient taking differently: 25 Units. INJECT UP TO 75 UNITS UNDER THE SKIN DAILY, TITRATE AS NEEDED.) 15 mL 2   Krill Oil 500 MG CAPS Take 1 capsule by mouth daily.     Lactobacillus (PROBIOTIC ACIDOPHILUS) CAPS Take 1 capsule by mouth daily.     methocarbamol (ROBAXIN) 750 MG tablet Take 1 tablet (750 mg total) by mouth every 6 (six) hours as needed for muscle spasms. 120 tablet 2   Multiple Vitamin (MULTIVITAMIN) tablet Take 1 tablet by mouth daily at 12 noon.     pantoprazole (PROTONIX) 40 MG tablet TAKE 1 TABLET BY MOUTH EVERY DAY 90 tablet 1   pregabalin (LYRICA) 100 MG capsule TAKE 1 CAPSULE BY MOUTH TWICE A DAY 60 capsule 0   rosuvastatin (CRESTOR) 20 MG tablet TAKE 1 TABLET BY MOUTH EVERYDAY AT BEDTIME 90 tablet 3   senna (SENOKOT) 8.6 MG TABS tablet Take 1 tablet (8.6 mg total) by mouth 2 (two) times daily. 120 tablet 0   oxyCODONE (OXY IR/ROXICODONE) 5 MG immediate release tablet Take 1 tablet (5 mg total) by mouth every 6 (six) hours as needed for severe pain. (Patient not taking: Reported on 08/13/2022) 28 tablet 0   tamoxifen (NOLVADEX) 20 MG tablet Take 1 tablet (20 mg total) by mouth daily. 90 tablet 3   No current facility-administered medications for this visit.    OBJECTIVE: There were no vitals filed for this visit.   There is no height or weight on file to calculate BMI.    ECOG FS:0 - Asymptomatic  General: Well-developed, well-nourished, no acute distress. HEENT: Normocephalic. Neuro: Alert, answering all questions appropriately. Cranial nerves grossly intact. Psych: Normal affect.   LAB RESULTS:  Lab Results  Component Value Date   NA 140 07/29/2022   K 4.0 07/29/2022   CL 101 07/29/2022   CO2 27 07/29/2022   GLUCOSE 114 (H) 07/29/2022   BUN 20 07/29/2022   CREATININE 1.23 (H)  07/29/2022   CALCIUM 8.7 07/29/2022   PROT 6.0 07/29/2022   ALBUMIN 3.3 (L) 07/29/2022   AST 9 07/29/2022   ALT 9 07/29/2022   ALKPHOS 120 07/29/2022   BILITOT 0.2 07/29/2022   GFRNONAA 45 (L) 06/25/2022   GFRAA 39 (L) 08/16/2019    Lab Results  Component Value Date   WBC 4.6 07/29/2022   NEUTROABS 2.4 07/29/2022   HGB 9.5 (L) 07/29/2022   HCT 31.4 (L) 07/29/2022   MCV 89 07/29/2022   PLT 180 07/29/2022     STUDIES: No results found.  ASSESSMENT: Low-grade DCIS of the right breast.  PLAN:    Low-grade DCIS of the right breast: Patient declined enrollment in COMET trial.  She underwent lumpectomy on January 22, 2019.  Final pathology noted close, but clear margins.  Patient initiated XRT, but then discontinued after approximately 3 weeks. She was extremely upset how she was treated by the radiation oncology provider and refused to return or continue XRT.  We previously discussed the possibility of transferring care to another facility which she also declined.  She acknowledged that although she has low-grade DCIS, this increases her risk of recurrence.  No further interventions are needed.  Continue tamoxifen for total of 5 years completing treatment in December 2025.  Her most recent mammogram on January 31, 2022 was reported as BI-RADS 2.  Repeat in October 2024.  Return to clinic in 6 months with video-assisted telemedicine visit.  Bullous pemphigoid: Improved.  Continue follow-up with dermatology as indicated. Osteoporosis: Managed by primary care.  Continue Fosamax, calcium, and vitamin D supplementation. Back pain/surgery: Continue follow-up with neurosurgery as indicated.  I provided 20 minutes of face-to-face video visit time during this encounter which included chart review, counseling, and coordination of care as documented above.     Patient expressed understanding and was in agreement with this plan. She also understands that She can call clinic at any time with any  questions, concerns, or complaints.    Cancer Staging  Ductal carcinoma in situ (DCIS) of right breast Staging form: Breast, AJCC 8th Edition - Clinical stage from 01/01/2019: Stage 0 (cTis (DCIS), cN0, cM0, G1, ER+, PR: Not Assessed, HER2: Not Assessed) - Signed by [name], MD on 01/01/2019 Stage prefix: Initial diagnosis Histologic grading system: 3 grade system   [name], MD   08/13/2022 3:59 PM

## 2022-08-14 ENCOUNTER — Other Ambulatory Visit: Payer: Self-pay

## 2022-08-14 DIAGNOSIS — M4316 Spondylolisthesis, lumbar region: Secondary | ICD-10-CM

## 2022-08-15 ENCOUNTER — Ambulatory Visit
Admission: RE | Admit: 2022-08-15 | Discharge: 2022-08-15 | Disposition: A | Discharge status: Home / Self Care | Payer: Medicare HMO | Source: Ambulatory Visit | Attending: Neurosurgery | Admitting: Neurosurgery

## 2022-08-15 ENCOUNTER — Ambulatory Visit (INDEPENDENT_AMBULATORY_CARE_PROVIDER_SITE_OTHER): Payer: Medicare HMO | Admitting: Neurosurgery

## 2022-08-15 ENCOUNTER — Encounter: Payer: Self-pay | Admitting: Neurosurgery

## 2022-08-15 VITALS — BP 127/72 | Temp 98.1°F | Ht 60.0 in | Wt 187.0 lb

## 2022-08-15 DIAGNOSIS — M48062 Spinal stenosis, lumbar region with neurogenic claudication: Secondary | ICD-10-CM

## 2022-08-15 DIAGNOSIS — Z09 Encounter for follow-up examination after completed treatment for conditions other than malignant neoplasm: Secondary | ICD-10-CM

## 2022-08-15 DIAGNOSIS — M4316 Spondylolisthesis, lumbar region: Secondary | ICD-10-CM | POA: Insufficient documentation

## 2022-08-15 NOTE — Progress Notes (Signed)
   REFERRING PHYSICIAN:  Burtis Junes [address] [place],  [place] [address]  DOS: 07/03/22  L3-L5 XLIF and PSF   HISTORY OF PRESENT ILLNESS: Kara [name] is status post L3-L5 XLIF and PSF.  She is doing well.  She has not taken medications in 2 weeks.  She is now walking without a cane most of the time.  Her leg pain is gone.      PHYSICAL EXAMINATION:  General: Patient is well developed, well nourished, calm, collected, and in no apparent distress.   NEUROLOGICAL:  General: In no acute distress.   Awake, alert, oriented to person, place, and time.  Pupils equal round and reactive to light.  Facial tone is symmetric.     Strength:            Side Iliopsoas Quads Hamstring PF DF EHL  R L Incisions c/d/I  She ambulates with a cane at times.    ROS (Neurologic):  Negative except as noted above  IMAGING: No complications noted.  Alignment much improved.  ASSESSMENT/PLAN:  Kara [name] is doing well s/p above surgery.  I am very pleased with her response to surgery.  We discussed her activity limitations.  She will begin increasing her activity with time.     Venetia Night Department of neurosurgery

## 2022-08-19 ENCOUNTER — Other Ambulatory Visit: Payer: Self-pay | Admitting: Nurse Practitioner

## 2022-08-19 DIAGNOSIS — H34832 Tributary (branch) retinal vein occlusion, left eye, with macular edema: Secondary | ICD-10-CM | POA: Diagnosis not present

## 2022-08-19 DIAGNOSIS — E1142 Type 2 diabetes mellitus with diabetic polyneuropathy: Secondary | ICD-10-CM

## 2022-09-03 ENCOUNTER — Ambulatory Visit: Payer: Medicare HMO | Admitting: Podiatry

## 2022-09-03 DIAGNOSIS — B351 Tinea unguium: Secondary | ICD-10-CM

## 2022-09-03 DIAGNOSIS — L120 Bullous pemphigoid: Secondary | ICD-10-CM | POA: Diagnosis not present

## 2022-09-03 DIAGNOSIS — M79675 Pain in left toe(s): Secondary | ICD-10-CM

## 2022-09-03 DIAGNOSIS — E1122 Type 2 diabetes mellitus with diabetic chronic kidney disease: Secondary | ICD-10-CM | POA: Diagnosis not present

## 2022-09-03 DIAGNOSIS — M79674 Pain in right toe(s): Secondary | ICD-10-CM

## 2022-09-03 DIAGNOSIS — Z794 Long term (current) use of insulin: Secondary | ICD-10-CM | POA: Diagnosis not present

## 2022-09-03 DIAGNOSIS — R600 Localized edema: Secondary | ICD-10-CM | POA: Diagnosis not present

## 2022-09-03 NOTE — Progress Notes (Signed)
  Subjective:  Patient ID: Kara [name], female    DOB: 1946-01-24,  MRN: [id number]  Chief Complaint  Patient presents with   Nail Problem    Nail trim    77 y.o. female returns for the above complaint.  Patient presents with thickened elongated dystrophic toenails x 10 mild pain on palpation hurts with ambulation or shoe pressure she would like for me to be done she is not able to resolve  Objective:  There were no vitals filed for this visit. Podiatric Exam: Vascular: dorsalis pedis and posterior tibial pulses are palpable bilateral. Capillary return is immediate. Temperature gradient is WNL. Skin turgor WNL  Sensorium: Normal Semmes Weinstein monofilament test. Normal tactile sensation bilaterally. Nail Exam: Pt has thick disfigured discolored nails with subungual debris noted bilateral entire nail hallux through fifth toenails.  Pain on palpation to the nails. Ulcer Exam: There is no evidence of ulcer or pre-ulcerative changes or infection. Orthopedic Exam: Muscle tone and strength are WNL. No limitations in general ROM. No crepitus or effusions noted.  Skin: No Porokeratosis. No infection or ulcers    Assessment & Plan:  No diagnosis found.  Patient was evaluated and treated and all questions answered.  Onychomycosis with pain  -Nails palliatively debrided as below. -Educated on self-care  Procedure: Nail Debridement Rationale: pain  Type of Debridement: manual, sharp debridement. Instrumentation: Nail nipper, rotary burr. Number of Nails: 10  Procedures and Treatment: Consent by patient was obtained for treatment procedures. The patient understood the discussion of treatment and procedures well. All questions were answered thoroughly reviewed. Debridement of mycotic and hypertrophic toenails, 1 through 5 bilateral and clearing of subungual debris. No ulceration, no infection noted.  Return Visit-Office Procedure: Patient instructed to return to the office for a follow up  visit 3 months for continued evaluation and treatment.  Nicholes Rough, DPM    No follow-ups on file.

## 2022-09-04 LAB — BMP8+1AC
BUN/Creatinine Ratio: 24 (ref 12–28)
BUN: 33 mg/dL — ABNORMAL HIGH (ref 8–27)
CO2: 26 mmol/L (ref 20–29)
Calcium: 9.1 mg/dL (ref 8.7–10.3)
Chloride: 100 mmol/L (ref 96–106)
Creatinine, Ser: 1.35 mg/dL — ABNORMAL HIGH (ref 0.57–1.00)
Glucose: 125 mg/dL — ABNORMAL HIGH (ref 70–99)
Potassium: 4.4 mmol/L (ref 3.5–5.2)
Sodium: 140 mmol/L (ref 134–144)
Uric Acid: 7.5 mg/dL (ref 3.1–7.9)
eGFR: 41 mL/min/{1.73_m2} — ABNORMAL LOW (ref >59)

## 2022-09-05 ENCOUNTER — Telehealth: Payer: Self-pay

## 2022-09-05 NOTE — Telephone Encounter (Signed)
-----   Message from [name], NP sent at 09/05/2022  9:09 AM EDT ----- No significant change in kidney function labs, electrolytes are normal and uric acid level is ok

## 2022-09-05 NOTE — Telephone Encounter (Signed)
Patient notified

## 2022-09-20 ENCOUNTER — Other Ambulatory Visit: Payer: Self-pay | Admitting: Orthopedic Surgery

## 2022-09-20 DIAGNOSIS — Z981 Arthrodesis status: Secondary | ICD-10-CM

## 2022-09-20 DIAGNOSIS — M4316 Spondylolisthesis, lumbar region: Secondary | ICD-10-CM

## 2022-09-20 NOTE — Progress Notes (Unsigned)
   REFERRING PHYSICIAN:  Burtis Junes [address] [place],  [place] [address]  DOS: 07/03/22  L3-L5 XLIF and PSF   HISTORY OF PRESENT ILLNESS: Last seen by Dr. Myer Haff on 08/15/22 and she ws doing well. Her preop leg pain was gone!         PHYSICAL EXAMINATION:  General: Patient is well developed, well nourished, calm, collected, and in no apparent distress.   NEUROLOGICAL:  General: In no acute distress.   Awake, alert, oriented to person, place, and time.  Pupils equal round and reactive to light.  Facial tone is symmetric.     Strength:            Side Iliopsoas Quads Hamstring PF DF EHL  R 5 5 5 5 5 5   L 5 5 5 5 5 5    Incisions well healed. ***  She ambulates with a cane at times. ***   ROS (Neurologic):  Negative except as noted above  IMAGING: Lumbar xrays dated ***:  No complications noted. ***   ASSESSMENT/PLAN:  Kara [name] is doing well s/p above surgery.  Treatment options reviewed with patient and following plan made:       We discussed her activity limitations.  She will begin increasing her activity with time.     Venetia Night Department of neurosurgery

## 2022-09-21 ENCOUNTER — Other Ambulatory Visit: Payer: Self-pay | Admitting: Dermatology

## 2022-09-21 ENCOUNTER — Other Ambulatory Visit: Payer: Self-pay | Admitting: Nurse Practitioner

## 2022-09-21 DIAGNOSIS — L120 Bullous pemphigoid: Secondary | ICD-10-CM

## 2022-09-21 DIAGNOSIS — E1142 Type 2 diabetes mellitus with diabetic polyneuropathy: Secondary | ICD-10-CM

## 2022-09-21 DIAGNOSIS — M4316 Spondylolisthesis, lumbar region: Secondary | ICD-10-CM

## 2022-09-21 DIAGNOSIS — G834 Cauda equina syndrome: Secondary | ICD-10-CM

## 2022-09-21 DIAGNOSIS — M4800 Spinal stenosis, site unspecified: Secondary | ICD-10-CM

## 2022-09-23 NOTE — Telephone Encounter (Signed)
Next 6/24

## 2022-09-24 ENCOUNTER — Ambulatory Visit
Admission: RE | Admit: 2022-09-24 | Discharge: 2022-09-24 | Disposition: A | Discharge status: Home / Self Care | Payer: Medicare HMO | Attending: Orthopedic Surgery | Admitting: Orthopedic Surgery

## 2022-09-24 ENCOUNTER — Encounter: Payer: Self-pay | Admitting: Orthopedic Surgery

## 2022-09-24 ENCOUNTER — Ambulatory Visit
Admission: RE | Admit: 2022-09-24 | Discharge: 2022-09-24 | Disposition: A | Discharge status: Home / Self Care | Payer: Medicare HMO | Source: Ambulatory Visit | Attending: Orthopedic Surgery | Admitting: Orthopedic Surgery

## 2022-09-24 ENCOUNTER — Ambulatory Visit (INDEPENDENT_AMBULATORY_CARE_PROVIDER_SITE_OTHER): Payer: Medicare HMO | Admitting: Orthopedic Surgery

## 2022-09-24 VITALS — BP 130/72 | Temp 98.7°F | Ht 60.0 in | Wt 187.0 lb

## 2022-09-24 DIAGNOSIS — Z981 Arthrodesis status: Secondary | ICD-10-CM

## 2022-09-24 DIAGNOSIS — M4316 Spondylolisthesis, lumbar region: Secondary | ICD-10-CM

## 2022-09-24 DIAGNOSIS — M48062 Spinal stenosis, lumbar region with neurogenic claudication: Secondary | ICD-10-CM

## 2022-09-30 DIAGNOSIS — H34832 Tributary (branch) retinal vein occlusion, left eye, with macular edema: Secondary | ICD-10-CM | POA: Diagnosis not present

## 2022-10-17 ENCOUNTER — Encounter: Payer: Self-pay | Admitting: Nurse Practitioner

## 2022-10-17 ENCOUNTER — Ambulatory Visit (INDEPENDENT_AMBULATORY_CARE_PROVIDER_SITE_OTHER): Payer: Medicare HMO | Admitting: Nurse Practitioner

## 2022-10-17 VITALS — BP 121/75 | Temp 98.2°F | Resp 16 | Ht 60.0 in | Wt 187.0 lb

## 2022-10-17 DIAGNOSIS — E1142 Type 2 diabetes mellitus with diabetic polyneuropathy: Secondary | ICD-10-CM

## 2022-10-17 DIAGNOSIS — M159 Polyosteoarthritis, unspecified: Secondary | ICD-10-CM

## 2022-10-17 DIAGNOSIS — E1122 Type 2 diabetes mellitus with diabetic chronic kidney disease: Secondary | ICD-10-CM

## 2022-10-17 DIAGNOSIS — Z794 Long term (current) use of insulin: Secondary | ICD-10-CM | POA: Diagnosis not present

## 2022-10-17 DIAGNOSIS — R300 Dysuria: Secondary | ICD-10-CM | POA: Diagnosis not present

## 2022-10-17 DIAGNOSIS — Z0001 Encounter for general adult medical examination with abnormal findings: Secondary | ICD-10-CM | POA: Diagnosis not present

## 2022-10-17 DIAGNOSIS — I10 Essential (primary) hypertension: Secondary | ICD-10-CM

## 2022-10-17 NOTE — Progress Notes (Signed)
[facility] [address] [place], [place] [address]  Internal MEDICINE  Office Visit Note  Patient Name: Kara [name]  [id number]  [id number]  Date of Service: 10/17/2022  Chief Complaint  Patient presents with   Diabetes   Hypertension   Hyperlipidemia   Medicare Wellness    HPI Kara [name] presents for an annual well visit and physical exam.  Well-appearing 78 y.o. female with hypertension, aortic atherosclerosis, GERD, IBS, diabetes, neuropathy, hyperparathyroidism, chronic low back pain, osteoarthritis of multiple joints, CKD stage 4, diverticulosis, anemia, high cholesterol, and history of breast cancer Routine CRC screening: cologuard due in December  Routine mammogram: due in October  DEXA scan: due as well.  Eye exam: seen by eye doctor 3-4 weeks ago, getting eye injections,  foot exam: done today Labs: will do labs in August  New or worsening pain: none Trouble sleeping -- going to sleep and does not stay asleep very long, has not tried any  Switching to insulin glargine soon per insurance -- lantus, toujeo or tresiba.  Will watching kidney function closely and reach out to nephrology when needed.        10/17/2022   10:43 AM 10/11/2021    9:49 AM 10/09/2020   10:45 AM  MMSE - Mini Mental State Exam  Orientation to time 5 5 5   Orientation to Place 5 5 5   Registration 3 3 3   Attention/ Calculation 5 5 5   Recall 3 3 3   Language- name 2 objects 2 2 2   Language- repeat 1 1 1   Language- follow 3 step command 3 3 3   Language- read & follow direction 1 1 1   Write a sentence 1 1 1   Copy design 1 1 1   Total score 30 30 30     Functional Status Survey: Is the patient deaf or have difficulty hearing?: No Does the patient have difficulty seeing, even when wearing glasses/contacts?: Yes Does the patient have difficulty concentrating, remembering, or making decisions?: No Does the patient have difficulty walking or climbing stairs?: No Does the patient have  difficulty dressing or bathing?: No Does the patient have difficulty doing errands alone such as visiting a doctor's office or shopping?: No     04/02/2022   11:06 AM 04/23/2022   11:13 AM 05/03/2022    9:24 AM 05/07/2022   12:29 PM 10/17/2022   10:42 AM  Fall Risk  Falls in the past year? 0 0 0 0 0  Was there an injury with Fall? 0  0  0  Fall Risk Category Calculator 0  0  0  Fall Risk Category (Retired) Low  Low    (RETIRED) Patient Fall Risk Level Low fall risk  Low fall risk    Patient at Risk for Falls Due to   No Fall Risks  No Fall Risks  Fall risk Follow up   Falls evaluation completed  Falls evaluation completed       10/17/2022   10:42 AM  Depression screen PHQ 2/9  Decreased Interest 0  Down, Depressed, Hopeless 0  PHQ - 2 Score 0       Current Medication: Outpatient Encounter Medications as of 10/17/2022  Medication Sig Note   Aflibercept (EYLEA) 2 MG/0.05ML SOLN  12/22/2021: Every 3 months   alendronate (FOSAMAX) 70 MG tablet Take 1 tablet (70 mg total) by mouth once a week. Take with a full glass of water on an empty stomach.    Calcium-Magnesium-Vitamin D (CALCIUM 1200+D3 PO) Take 1 tablet  by mouth daily.    Collagen-Boron-Hyaluronic Acid (MOVE FREE ULTRA JOINT HEALTH) 40-5-3.3 MG TABS Take 1 tablet by mouth daily.    docusate sodium (COLACE) 100 MG capsule Take 200 mg by mouth daily as needed.    doxycycline (MONODOX) 100 MG capsule TAKE 1 CAPSULE (100 MG TOTAL) BY MOUTH 2 (TWO) TIMES DAILY. TAKE WITH FOOD.    Dulaglutide (TRULICITY) 1.5 MG/0.5ML SOPN Inject 1.5 mg into the skin once a week. (Patient taking differently: Inject 1.5 mg into the skin once a week. Monday's)    Ferrous Gluconate 324 (37.5 Fe) MG TABS Take 324 mg by mouth daily.    furosemide (LASIX) 20 MG tablet Take 2 tablets by mouth in the AM and take 1 tablet by mouth in the PM. May add 1 tablet to afternoon dose if swelling not improved.    insulin aspart (NOVOLOG FLEXPEN) 100 UNIT/ML FlexPen  INJECT [place] 3 times daily with meals AS DIRECTED PER SLIDING SCALE. MAX DAILY DOSE IS 30 UNITS. DX E11.65. (Patient taking differently: 4-14 Units 3 (three) times daily with meals. INJECT [place] 3 times daily with meals AS DIRECTED PER SLIDING SCALE. MAX DAILY DOSE IS 30 UNITS. DX E11.65.)    insulin aspart (NOVOLOG FLEXPEN) 100 UNIT/ML FlexPen INJECT UNDER THE SKIN TWICE DAILY AS DIRECTED PER SLIDING SCALE. MAX DAILY DOSE IS 30 UNITS.    insulin detemir (LEVEMIR FLEXPEN) 100 UNIT/ML FlexPen INJECT UP TO 75 UNITS UNDER THE SKIN DAILY, TITRATE AS NEEDED. (Patient taking differently: 25 Units. INJECT UP TO 75 UNITS UNDER THE SKIN DAILY, TITRATE AS NEEDED.)    Krill Oil 500 MG CAPS Take 1 capsule by mouth daily.    Lactobacillus (PROBIOTIC ACIDOPHILUS) CAPS Take 1 capsule by mouth daily.    methocarbamol (ROBAXIN) 750 MG tablet Take 1 tablet (750 mg total) by mouth every 6 (six) hours as needed for muscle spasms.    Multiple Vitamin (MULTIVITAMIN) tablet Take 1 tablet by mouth daily at 12 noon.    oxyCODONE (OXY IR/ROXICODONE) 5 MG immediate release tablet Take 1 tablet (5 mg total) by mouth every 6 (six) hours as needed for severe pain.    pantoprazole (PROTONIX) 40 MG tablet TAKE 1 TABLET BY MOUTH EVERY DAY    pregabalin (LYRICA) 100 MG capsule TAKE 1 CAPSULE BY MOUTH TWICE A DAY    rosuvastatin (CRESTOR) 20 MG tablet TAKE 1 TABLET BY MOUTH EVERYDAY AT BEDTIME    senna (SENOKOT) 8.6 MG TABS tablet Take 1 tablet (8.6 mg total) by mouth 2 (two) times daily.    tamoxifen (NOLVADEX) 20 MG tablet Take 1 tablet (20 mg total) by mouth daily.    traMADol-acetaminophen (ULTRACET) 37.5-325 MG tablet Take 1 tablet by mouth every 6 (six) hours as needed for moderate pain or severe pain. May request refill after 5 days    No facility-administered encounter medications on file as of 10/17/2022.    Surgical History: Past Surgical History:  Procedure Laterality Date   ANTERIOR LATERAL LUMBAR FUSION WITH PERCUTANEOUS  SCREW 2 LEVEL N/A 07/03/2022   Procedure: L3-5 LATERAL LUMBAR INTERBODY FUSION WITH POSTERIOR SPINAL FUSION;  Surgeon: Venetia Night, MD;  Location: [facility];  Service: Neurosurgery;  Laterality: N/A;   APPLICATION OF INTRAOPERATIVE CT SCAN N/A 07/03/2022   Procedure: APPLICATION OF INTRAOPERATIVE CT SCAN;  Surgeon: Venetia Night, MD;  Location: [facility];  Service: Neurosurgery;  Laterality: N/A;   BILATERAL SALPINGECTOMY     BREAST BIOPSY Right 2014   neg- core   BREAST  BIOPSY Right 12/21/2018   [place] bx, venus clip,  DUCTAL CARCINOMA IN SITU   BREAST BIOPSY Right 12/21/2018   [place] bx, ribbon clip,  FIBROEPITHELIAL PROLIFERATION WITH SCLEROSIS   BREAST BIOPSY Right 01/01/2019   Affirm bx "X" clip-path pending   BREAST DUCTAL SYSTEM EXCISION     BREAST EXCISIONAL BIOPSY Right 2014   neg   BREAST LUMPECTOMY Right 01/22/2019   path pending   CATARACT EXTRACTION     CHOLECYSTECTOMY     CYSTOSCOPY WITH STENT PLACEMENT Left 04/14/2015   Procedure: CYSTOSCOPY WITH STENT PLACEMENT;  Surgeon: Vanna Scotland, MD;  Location: [facility];  Service: Urology;  Laterality: Left;   EXTRACORPOREAL SHOCK WAVE LITHOTRIPSY Left 09/29/2014   Procedure: EXTRACORPOREAL SHOCK WAVE LITHOTRIPSY (ESWL);  Surgeon: Vanna Scotland, MD;  Location: [facility];  Service: Urology;  Laterality: Left;   EYE SURGERY     bilateral cataract   IRRIGATION AND DEBRIDEMENT ABSCESS Right 03/24/2019   Procedure: IRRIGATION AND DEBRIDEMENT ABSCESS RIGHT BREAST;  Surgeon: [name], MD;  Location: [facility];  Service: General;  Laterality: Right;   LAPAROSCOPIC OOPHERECTOMY Left    PARTIAL MASTECTOMY WITH NEEDLE LOCALIZATION Right 01/22/2019   Procedure: PARTIAL MASTECTOMY WITH NEEDLE LOCALIZATION;  Surgeon: [name], MD;  Location: [facility];  Service: General;  Laterality: Right;   TONSILLECTOMY     TUBAL LIGATION     URETEROSCOPY WITH HOLMIUM LASER LITHOTRIPSY Left 04/14/2015   Procedure: URETEROSCOPY WITH  HOLMIUM LASER LITHOTRIPSY;  Surgeon: Vanna Scotland, MD;  Location: [facility];  Service: Urology;  Laterality: Left;    Medical History: Past Medical History:  Diagnosis Date   Anemia    Arthropathy    Benign neoplasm of breast    Breast cancer (HCC)    DCIS   Chest pain    CKD (chronic kidney disease), stage IV (HCC)    Diabetes (HCC)    Diabetic retinopathy (HCC)    Dyspnea    Edema    History of kidney stones    Hyperlipemia    Hypersomnia with sleep apnea    Hypertension    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HCC)    Lumbago    Neuropathy    Osteoarthrosis    Osteoporosis    Ovarian failure    Personal history of radiation therapy    Pneumonia due to COVID-19 virus 12/21/2021   a.) required inpatient admission   SOB (shortness of breath)     Family History: Family History  Problem Relation Age of Onset   Breast cancer Maternal Grandmother 60   Colon cancer Mother    Lung cancer Father     Social History   Socioeconomic History   Marital status: Married    Spouse name: Not on file   Number of children: Not on file   Years of education: Not on file   Highest education level: Not on file  Occupational History   Not on file  Tobacco Use   Smoking status: Former    Packs/day: 1.00    Years: 30.00    Additional pack years: 0.00    Total pack years: 30.00    Types: Cigarettes    Quit date: 04/11/1995    Years since quitting: 27.5   Smokeless tobacco: Never  Vaping Use   Vaping Use: Never used  Substance and Sexual Activity   Alcohol use: No    Alcohol/week: 0.0 standard drinks of alcohol   Drug use: No   Sexual activity: Not on file  Other  Topics Concern   Not on file  Social History Narrative   Not on file   Social Determinants of Health   Financial Resource Strain: Low Risk  (10/03/2020)   Overall Financial Resource Strain (CARDIA)    Difficulty of Paying Living Expenses: Not very hard  Food Insecurity: No Food Insecurity (07/03/2022)   Hunger  Vital Sign    Worried About Running Out of Food in the Last Year: Never true    Ran Out of Food in the Last Year: Never true  Transportation Needs: No Transportation Needs (07/03/2022)   PRAPARE - Administrator, Civil Service (Medical): No    Lack of Transportation (Non-Medical): No  Physical Activity: Not on file  Stress: Not on file  Social Connections: Not on file  Intimate Partner Violence: Not At Risk (07/03/2022)   Humiliation, Afraid, Rape, and Kick questionnaire    Fear of Current or Ex-Partner: No    Emotionally Abused: No    Physically Abused: No    Sexually Abused: No      Review of Systems  Constitutional:  Negative for activity change, appetite change, chills, fatigue, fever and unexpected weight change.  HENT: Negative.  Negative for congestion, ear pain, rhinorrhea, sore throat and trouble swallowing.   Eyes: Negative.   Respiratory: Negative.  Negative for cough, chest tightness, shortness of breath and wheezing.   Cardiovascular: Negative.  Negative for chest pain.  Gastrointestinal: Negative.  Negative for abdominal pain, blood in stool, constipation, diarrhea, nausea and vomiting.  Endocrine: Negative.   Genitourinary: Negative.  Negative for difficulty urinating, dysuria, frequency, hematuria and urgency.  Musculoskeletal: Negative.  Negative for arthralgias, back pain, joint swelling, myalgias and neck pain.  Skin: Negative.  Negative for rash and wound.  Allergic/Immunologic: Negative.  Negative for immunocompromised state.  Neurological: Negative.  Negative for dizziness, seizures, numbness and headaches.  Hematological: Negative.   Psychiatric/Behavioral: Negative.  Negative for behavioral problems, self-injury and suicidal ideas. The patient is not nervous/anxious.     Vital Signs: BP 121/75   Temp 98.2 F (36.8 C)   Resp 16   Ht 5' (1.524 m)   Wt 187 lb (84.8 kg)   BMI 36.52 kg/m    Physical Exam Vitals reviewed.  Constitutional:       General: She is awake. She is not in acute distress.    Appearance: Normal appearance. She is well-developed and well-groomed. She is obese. She is not ill-appearing or diaphoretic.  HENT:     Head: Normocephalic and atraumatic.     Right Ear: Tympanic membrane, ear canal and external ear normal.     Left Ear: Tympanic membrane, ear canal and external ear normal.     Nose: Nose normal. No congestion or rhinorrhea.     Mouth/Throat:     Lips: Pink.     Mouth: Mucous membranes are moist.     Pharynx: Oropharynx is clear. Uvula midline. No oropharyngeal exudate or posterior oropharyngeal erythema.  Eyes:     General: Lids are normal. Vision grossly intact. Gaze aligned appropriately. No scleral icterus.       Right eye: No discharge.        Left eye: No discharge.     Extraocular Movements: Extraocular movements intact.     Conjunctiva/sclera: Conjunctivae normal.     Pupils: Pupils are equal, round, and reactive to light.     Funduscopic exam:    Right eye: Red reflex present.  Left eye: Red reflex present. Neck:     Thyroid: No thyromegaly.     Vascular: No JVD.     Trachea: Trachea and phonation normal. No tracheal deviation.  Cardiovascular:     Rate and Rhythm: Normal rate and regular rhythm.     Pulses: Normal pulses.          Dorsalis pedis pulses are 2+ on the right side and 2+ on the left side.       Posterior tibial pulses are 2+ on the right side and 2+ on the left side.     Heart sounds: Normal heart sounds, S1 normal and S2 normal. No murmur heard.    No friction rub. No gallop.  Pulmonary:     Effort: Pulmonary effort is normal. No accessory muscle usage or respiratory distress.     Breath sounds: Normal breath sounds and air entry. No stridor. No wheezing or rales.  Chest:     Chest wall: No tenderness.     Comments: Declined clinical breast exam Abdominal:     General: Bowel sounds are normal. There is no distension.     Palpations: Abdomen is soft.  There is no shifting dullness, fluid wave, mass or pulsatile mass.     Tenderness: There is no abdominal tenderness. There is no guarding or rebound.  Musculoskeletal:        General: No tenderness or deformity. Normal range of motion.     Cervical back: Normal range of motion and neck supple.     Right lower leg: No edema.     Left lower leg: No edema.     Right foot: Normal range of motion. No deformity, bunion, Charcot foot, foot drop or prominent metatarsal heads.     Left foot: Normal range of motion. No deformity, bunion, Charcot foot, foot drop or prominent metatarsal heads.  Feet:     Right foot:     Protective Sensation: 6 sites tested.  6 sites sensed.     Skin integrity: Callus and dry skin present. No ulcer, blister, skin breakdown, erythema, warmth or fissure.     Toenail Condition: Right toenails are abnormally thick.     Left foot:     Protective Sensation: 6 sites tested.  6 sites sensed.     Skin integrity: Callus and dry skin present. No ulcer, blister, skin breakdown, erythema, warmth or fissure.     Toenail Condition: Left toenails are abnormally thick.  Lymphadenopathy:     Cervical: No cervical adenopathy.  Skin:    General: Skin is warm and dry.     Capillary Refill: Capillary refill takes less than 2 seconds.     Coloration: Skin is not pale.     Findings: No erythema or rash.  Neurological:     Mental Status: She is alert and oriented to person, place, and time.     Cranial Nerves: No cranial nerve deficit.     Motor: No abnormal muscle tone.     Coordination: Coordination normal.     Deep Tendon Reflexes: Reflexes are normal and symmetric.  Psychiatric:        Mood and Affect: Mood normal.        Behavior: Behavior normal. Behavior is cooperative.        Thought Content: Thought content normal.        Judgment: Judgment normal.        Assessment/Plan: 1. Encounter for routine adult health examination with abnormal findings Age-appropriate  preventive screenings  and vaccinations discussed, annual physical exam completed. Routine labs for health maintenance deferred until August. PHM updated.   2. Type 2 diabetes mellitus with chronic kidney disease, with long-term current use of insulin, unspecified CKD stage (HCC) Continue medications as prescribed, urine sent for microalbumin.  - Urine Microalbumin w/creat. ratio  3. Essential hypertension Continue furosemide as prescribed.   4. Diabetic polyneuropathy associated with type 2 diabetes mellitus (HCC) Continue pregabalin as prescribed  5. Generalized osteoarthritis of multiple sites Continue prn ultracet and methocarbamol as prescribed.   6. Dysuria Routine urinalysis done  - UA/M w/rflx Culture, Routine     General Counseling: Kara [name] verbalizes understanding of the findings of todays visit and agrees with plan of treatment. I have discussed any further diagnostic evaluation that may be needed or ordered today. We also reviewed her medications today. she has been encouraged to call the office with any questions or concerns that should arise related to todays visit.    Orders Placed This Encounter  Procedures   Urine Microalbumin w/creat. ratio   UA/M w/rflx Culture, Routine    No orders of the defined types were placed in this encounter.   Return in about 2 months (around 12/17/2022) for F/U, Kara [name] PCP.   Total time spent:30 Minutes Time spent includes review of chart, medications, test results, and follow up plan with the patient.   [place] Controlled Substance Database was reviewed by me.  This patient was seen by [name], FNP-C in collaboration with Dr. Beverely Risen as a part of collaborative care agreement.  Sakoya Win R. Tedd Sias, MSN, FNP-C Internal medicine

## 2022-10-18 LAB — UA/M W/RFLX CULTURE, ROUTINE
Bilirubin, UA: NEGATIVE
Glucose, UA: NEGATIVE
Ketones, UA: NEGATIVE
Leukocytes,UA: NEGATIVE
Nitrite, UA: NEGATIVE
Protein,UA: NEGATIVE
RBC, UA: NEGATIVE
Specific Gravity, UA: 1.007 (ref 1.005–1.030)
Urobilinogen, Ur: 0.2 mg/dL (ref 0.2–1.0)
pH, UA: 7.5 (ref 5.0–7.5)

## 2022-10-18 LAB — MICROSCOPIC EXAMINATION
Bacteria, UA: NONE SEEN
Casts: NONE SEEN /lpf
Epithelial Cells (non renal): NONE SEEN /hpf (ref 0–10)
RBC, Urine: NONE SEEN /hpf (ref 0–2)
WBC, UA: NONE SEEN /hpf (ref 0–5)

## 2022-10-18 LAB — MICROALBUMIN / CREATININE URINE RATIO
Creatinine, Urine: 10.9 mg/dL
Microalb/Creat Ratio: <28 mg/g creat (ref 0–29)
Microalbumin, Urine: <3 ug/mL

## 2022-10-21 ENCOUNTER — Other Ambulatory Visit: Payer: Self-pay | Admitting: Nurse Practitioner

## 2022-10-21 ENCOUNTER — Ambulatory Visit: Payer: Medicare HMO | Admitting: Dermatology

## 2022-10-21 DIAGNOSIS — E1142 Type 2 diabetes mellitus with diabetic polyneuropathy: Secondary | ICD-10-CM

## 2022-10-21 NOTE — Telephone Encounter (Signed)
Please review and send 

## 2022-11-06 ENCOUNTER — Ambulatory Visit: Payer: Medicare HMO | Admitting: Dermatology

## 2022-11-06 VITALS — BP 122/66 | HR 82

## 2022-11-06 DIAGNOSIS — W908XXA Exposure to other nonionizing radiation, initial encounter: Secondary | ICD-10-CM | POA: Diagnosis not present

## 2022-11-06 DIAGNOSIS — L814 Other melanin hyperpigmentation: Secondary | ICD-10-CM | POA: Diagnosis not present

## 2022-11-06 DIAGNOSIS — L03116 Cellulitis of left lower limb: Secondary | ICD-10-CM

## 2022-11-06 DIAGNOSIS — Z1283 Encounter for screening for malignant neoplasm of skin: Secondary | ICD-10-CM | POA: Diagnosis not present

## 2022-11-06 DIAGNOSIS — L578 Other skin changes due to chronic exposure to nonionizing radiation: Secondary | ICD-10-CM | POA: Diagnosis not present

## 2022-11-06 DIAGNOSIS — D1721 Benign lipomatous neoplasm of skin and subcutaneous tissue of right arm: Secondary | ICD-10-CM

## 2022-11-06 DIAGNOSIS — L729 Follicular cyst of the skin and subcutaneous tissue, unspecified: Secondary | ICD-10-CM

## 2022-11-06 DIAGNOSIS — L720 Epidermal cyst: Secondary | ICD-10-CM | POA: Diagnosis not present

## 2022-11-06 DIAGNOSIS — D229 Melanocytic nevi, unspecified: Secondary | ICD-10-CM

## 2022-11-06 DIAGNOSIS — D1801 Hemangioma of skin and subcutaneous tissue: Secondary | ICD-10-CM

## 2022-11-06 DIAGNOSIS — D692 Other nonthrombocytopenic purpura: Secondary | ICD-10-CM

## 2022-11-06 DIAGNOSIS — I781 Nevus, non-neoplastic: Secondary | ICD-10-CM

## 2022-11-06 DIAGNOSIS — L821 Other seborrheic keratosis: Secondary | ICD-10-CM | POA: Diagnosis not present

## 2022-11-06 DIAGNOSIS — L120 Bullous pemphigoid: Secondary | ICD-10-CM | POA: Diagnosis not present

## 2022-11-06 DIAGNOSIS — Z79899 Other long term (current) drug therapy: Secondary | ICD-10-CM

## 2022-11-06 MED ORDER — DOXYCYCLINE MONOHYDRATE 100 MG PO CAPS: 100.0000 mg | ORAL_CAPSULE | Freq: Two times a day (BID) | ORAL | 5 refills | Status: DC

## 2022-11-06 MED ORDER — KETOCONAZOLE 2 % EX CREA: TOPICAL_CREAM | CUTANEOUS | 0 refills | Status: AC

## 2022-11-06 MED ORDER — HYDROCORTISONE 2.5 % EX CREA: TOPICAL_CREAM | CUTANEOUS | 0 refills | Status: DC

## 2022-11-06 NOTE — Patient Instructions (Addendum)
Mix equal amounts of hydrocortisone 2.5% cream with ketaconazole 2% cream and apply to affected areas buttocks twice a day.  If improved, decrease to hydrocortisone and ketaconazole mixed once a day. If still clear, decrease to ketaconazole cream only, once daily to help prevent flares.  Do not use the hydrocortisone cream for maintenance, since long term use of a topical steroid can thin the skin.  May repeat regimen as needed for flares.     Pre-Operative Instructions  You are scheduled for a surgical procedure at [facility]. We recommend you read the following instructions. If you have any questions or concerns, please call the office at [phone].  Shower and wash the entire body with soap and water the day of your surgery paying special attention to cleansing at and around the planned surgery site.  Avoid aspirin or aspirin containing products at least fourteen (14) days prior to your surgical procedure and for at least one week (7 Days) after your surgical procedure. If you take aspirin on a regular basis for heart disease or history of stroke or for any other reason, we may recommend you continue taking aspirin but please notify us if you take this on a regular basis. Aspirin can cause more bleeding to occur during surgery as well as prolonged bleeding and bruising after surgery.   Avoid other nonsteroidal pain medications at least one week prior to surgery and at least one week prior to your surgery. These include medications such as Ibuprofen (Motrin, Advil and Nuprin), Naprosyn, Voltaren, Relafen, etc. If medications are used for therapeutic reasons, please inform us as they can cause increased bleeding or prolonged bleeding during and bruising after surgical procedures.   Please advise [place] if you are taking any "blood thinner" medications such as Coumadin or Dipyridamole or Plavix or similar medications. These cause increased bleeding and prolonged bleeding during procedures and  bruising after surgical procedures. We may have to consider discontinuing these medications briefly prior to and shortly after your surgery if safe to do so.   Please inform us of all medications you are currently taking. All medications that are taken regularly should be taken the day of surgery as you always do. Nevertheless, we need to be informed of what medications you are taking prior to surgery to know whether they will affect the procedure or cause any complications.   Please inform us of any medication allergies. Also inform us of whether you have allergies to Latex or rubber products or whether you have had any adverse reaction to Lidocaine or Epinephrine.  Please inform us of any prosthetic or artificial body parts such as artificial heart valve, joint replacements, etc., or similar condition that might require preoperative antibiotics.   We recommend avoidance of alcohol at least two weeks prior to surgery and continued avoidance for at least two weeks after surgery.   We recommend discontinuation of tobacco smoking at least two weeks prior to surgery and continued abstinence for at least two weeks after surgery.  Do not plan strenuous exercise, strenuous work or strenuous lifting for approximately four weeks after your surgery.   We request if you are unable to make your scheduled surgical appointment, please call us at least a week in advance or as soon as you are aware of a problem so that we can cancel or reschedule the appointment.   You MAY TAKE TYLENOL (acetaminophen) for pain as it is not a blood thinner.   PLEASE PLAN TO BE IN TOWN FOR TWO  WEEKS FOLLOWING SURGERY, THIS IS IMPORTANT SO YOU CAN BE CHECKED FOR DRESSING CHANGES, SUTURE REMOVAL AND TO MONITOR FOR POSSIBLE COMPLICATIONS.    Due to recent changes in healthcare laws, you may see results of your pathology and/or laboratory studies on MyChart before the doctors have had a chance to review them. We understand that in  some cases there may be results that are confusing or concerning to you. Please understand that not all results are received at the same time and often the doctors may need to interpret multiple results in order to provide you with the best plan of care or course of treatment. Therefore, we ask that you please give [place] 2 business days to thoroughly review all your results before contacting the office for clarification. Should we see a critical lab result, you will be contacted sooner.   If You Need Anything After Your Visit  If you have any questions or concerns for your doctor, please call our main line at [phone] and press option 4 to reach your doctor's medical assistant. If no one answers, please leave a voicemail as directed and we will return your call as soon as possible. Messages left after 4 pm will be answered the following business day.   You may also send [place] a message via MyChart. We typically respond to MyChart messages within 1-2 business days.  For prescription refills, please ask your pharmacy to contact our office. Our fax number is [phone].  If you have an urgent issue when the clinic is closed that cannot wait until the next business day, you can page your doctor at the number below.    Please note that while we do our best to be available for urgent issues outside of office hours, we are not available 24/7.   If you have an urgent issue and are unable to reach [place], you may choose to seek medical care at your doctor's office, retail clinic, urgent care center, or emergency room.  If you have a medical emergency, please immediately call 911 or go to the emergency department.  Pager Numbers  - Dr. Gwen Pounds: [phone]  - Dr. Neale Burly: [phone]  - Dr. Roseanne Reno: [phone]  In the event of inclement weather, please call our main line at [phone] for an update on the status of any delays or closures.  Dermatology Medication Tips: Please keep the boxes that  topical medications come in in order to help keep track of the instructions about where and how to use these. Pharmacies typically print the medication instructions only on the boxes and not directly on the medication tubes.   If your medication is too expensive, please contact our office at [phone] option 4 or send [place] a message through MyChart.   We are unable to tell what your co-pay for medications will be in advance as this is different depending on your insurance coverage. However, we may be able to find a substitute medication at lower cost or fill out paperwork to get insurance to cover a needed medication.   If a prior authorization is required to get your medication covered by your insurance company, please allow [place] 1-2 business days to complete this process.  Drug prices often vary depending on where the prescription is filled and some pharmacies may offer cheaper prices.  The website www.goodrx.com contains coupons for medications through different pharmacies. The prices here do not account for what the cost may be with help from insurance (it may be cheaper with your insurance), but  the website can give you the price if you did not use any insurance.  - You can print the associated coupon and take it with your prescription to the pharmacy.  - You may also stop by our office during regular business hours and pick up a GoodRx coupon card.  - If you need your prescription sent electronically to a different pharmacy, notify our office through [facility] or by phone at [phone] option 4.     Si Usted Necesita Algo Despus de Su Visita  Tambin puede enviarnos un mensaje a travs de Clinical cytogeneticist. Por lo general respondemos a los mensajes de MyChart en el transcurso de 1 a 2 das hbiles.  Para renovar recetas, por favor pida a su farmacia que se ponga en contacto con nuestra oficina. Annie Sable de fax es [place] [phone].  Si tiene un asunto urgente cuando la clnica  est cerrada y que no puede esperar hasta el siguiente da hbil, puede llamar/localizar a su doctor(a) al nmero que aparece a continuacin.   Por favor, tenga en cuenta que aunque hacemos todo lo posible para estar disponibles para asuntos urgentes fuera del horario de [place], no estamos disponibles las 24 horas del da, los 7 [address]  [address] de la [place].   Si tiene un problema urgente y no puede comunicarse con nosotros, puede optar por buscar atencin mdica  en el consultorio de su doctor(a), en una clnica privada, en un centro de atencin urgente o en una sala de emergencias.  Si tiene Engineer, drilling, por favor llame inmediatamente al 911 o vaya a la sala de emergencias.  Nmeros de bper  - Dr. Gwen Pounds: [phone]  - Dra. Moye: [phone]  - Dra. Roseanne Reno: [phone]  En caso de inclemencias del [place], por favor llame a Lacy Duverney principal al [phone] para una actualizacin sobre el [place] de cualquier retraso o cierre.  Consejos para la medicacin en dermatologa: Por favor, guarde las cajas en las que vienen los medicamentos de uso tpico para ayudarle a seguir las instrucciones sobre dnde y cmo usarlos. Las farmacias generalmente imprimen las instrucciones del medicamento slo en las cajas y no directamente en los tubos del [place].   Si su medicamento es muy caro, por favor, pngase en contacto con Rolm [place]ala llamando al [phone] y presione la opcin 4 o envenos un mensaje a travs de Clinical cytogeneticist.   No podemos decirle cul ser su copago por los medicamentos por adelantado ya que esto es diferente dependiendo de la cobertura de su seguro. Sin embargo, es posible que podamos encontrar un medicamento sustituto a Audiological scientist un formulario para que el seguro cubra el medicamento que se considera necesario.   Si se requiere una autorizacin previa para que su compaa de seguros [place] su medicamento, por favor permtanos de 1 a 2 das hbiles para  completar [address]  Los precios de los medicamentos varan con frecuencia dependiendo del Environmental consultant de dnde se surte la receta y alguna farmacias pueden ofrecer precios ms baratos.  El sitio web www.goodrx.com tiene cupones para medicamentos de Health and safety inspector. Los precios aqu no tienen en cuenta lo que podra costar con la ayuda del seguro (puede ser ms barato con su seguro), pero el sitio web puede darle el precio si no utiliz Tourist information centre manager.  - Puede imprimir el cupn correspondiente y llevarlo con su receta a la farmacia.  - Tambin puede pasar por nuestra oficina durante el horario de atencin regular y Education officer, museum una tarjeta de cupones de  GoodRx.  - Si necesita que su receta se enve electrnicamente a una farmacia diferente, informe a nuestra oficina a travs de MyChart de [place] o por telfono llamando al [phone] y presione la opcin 4.

## 2022-11-06 NOTE — Progress Notes (Signed)
Follow-Up Visit   Subjective  Kara [name] is a 77 y.o. female who presents for the following: Skin Cancer Screening and Full Body Skin Exam  The patient presents for Total-Body Skin Exam (TBSE) for skin cancer screening and mole check. The patient has spots, moles and lesions to be evaluated, some may be new or changing. Patient has a few spots on the buttocks to check- spot was swollen, sore and has drained. She has soreness and redness of the left knee since Saturday. No history of fall. She denies any fevers/chills.   The following portions of the chart were reviewed this encounter and updated as appropriate: medications, allergies, medical history  Review of Systems:  No other skin or systemic complaints except as noted in HPI or Assessment and Plan.  Objective  Well appearing patient in no apparent distress; mood and affect are within normal limits.  A full examination was performed including scalp, head, eyes, ears, nose, lips, neck, chest, axillae, abdomen, back, buttocks, bilateral upper extremities, bilateral lower extremities, hands, feet, fingers, toes, fingernails, and toenails. All findings within normal limits unless otherwise noted below.   Relevant physical exam findings are noted in the Assessment and Plan.  Left knee, L lower leg. L med lower thigh Tender to touch, edematous erythema of the Left knee, L med lower thigh.   arms, legs Clear today.    Assessment & Plan   SKIN CANCER SCREENING PERFORMED TODAY.  ACTINIC DAMAGE - Chronic condition, secondary to cumulative UV/sun exposure - diffuse scaly erythematous macules with underlying dyspigmentation - Recommend daily broad spectrum sunscreen SPF 30+ to sun-exposed areas, reapply every 2 hours as needed.  - Staying in the shade or wearing long sleeves, sun glasses (UVA+UVB protection) and wide brim hats (4-inch brim around the entire circumference of the hat) are also recommended for sun protection.  - Call  for new or changing lesions.  LENTIGINES, SEBORRHEIC KERATOSES, HEMANGIOMAS - Benign normal skin lesions - Benign-appearing - Call for any changes  MELANOCYTIC NEVI - Tan-brown and/or pink-flesh-colored symmetric macules and papules - Benign appearing on exam today - Observation - Call clinic for new or changing moles - Recommend daily use of broad spectrum spf 30+ sunscreen to sun-exposed areas.   Purpura - Chronic; persistent and recurrent.  Treatable, but not curable. - Violaceous macules and patches - Benign - Related to trauma, age, sun damage and/or use of blood thinners, chronic use of topical and/or oral steroids - Observe - Can use OTC arnica containing moisturizer such as Dermend Bruise Formula if desired - Call for worsening or other concerns  TELANGIECTASIA Exam: dilated blood vessel(s) chest  Treatment Plan: Benign appearing on exam Call for changes  LIPOMA Exam: Soft rubbery nodule Location: right antecubitum  Benign-appearing. Exam most consistent with a lipoma. Discussed that a lipoma is a benign fatty growth that can grow over time and sometimes get irritated. Recommend observation if it is not bothersome or changing. Discussed option of ILK injections or surgical excision to remove it if it is growing, symptomatic, or other changes noted. Please call for new or changing lesions so they can be evaluated.  INFLAMED EPIDERMAL INCLUSION CYST Exam: Firm subcutaneous nodule with surrounding erythema at right medial gluteal cleft  Mix equal amounts of hydrocortisone 2.5% cream with ketaconazole 2% cream and apply to affected areas twice a day.  If improved, decrease to hydrocortisone and ketaconazole mixed once a day. If still clear, decrease to ketaconazole cream only, once daily to help  prevent flares.  Do not use the hydrocortisone cream for maintenance, since long term use of a topical steroid can thin the skin.  May repeat regimen as needed for flares.  Continue  doxycycline 100 mg PO bid Warm compresses prn Recommend excision once inflammation has calmed down  Benign-appearing. Exam most consistent with an epidermal inclusion cyst. Discussed that a cyst is a benign growth that can grow over time and sometimes get irritated or inflamed. Recommend observation if it is not bothersome. Discussed option of surgical excision to remove it if it is growing, symptomatic, or other changes noted. Please call for new or changing lesions so they can be evaluated.    Cellulitis of left lower extremity Left knee, L lower leg. L med lower thigh  Possible Cellulitis vs Acute Arthritic Joint.  Recommend patient be seen by PCP ASAP. Called to schedule appointment for patient and they had 2 openings for tomorrow. Patient declined both appointments and will call back for appt if leg doesn't improve. Recommended patient go to Urgent Care with any worsening.   Bullous pemphigoid arms, legs  Chronic condition with duration or expected duration over one year. Currently well-controlled.  Continue doxycycine 100 MG take 1 po BID with food. Doxycycline should be taken with food to prevent nausea. Do not lay down for 30 minutes after taking. Be cautious with sun exposure and use good sun protection while on this medication. Pregnant women should not take this medication.   Continue clobetasol ointment to affected areas twice daily until clear prn flares. Avoid applying to face, groin, and axilla. Use as directed. Long-term use can cause thinning of the skin.   doxycycline (MONODOX) 100 MG capsule - arms, legs Take 1 capsule (100 mg total) by mouth 2 (two) times daily. Take with food.  Long term medication management.  Patient is using long term (months to years) prescription medication  to control their dermatologic condition.  These medications require periodic monitoring to evaluate for efficacy and side effects and may require periodic laboratory monitoring.   Return  for surgery for cyst gluteal cleft end of September.  ICherlyn Labella, CMA, am acting as scribe for Willeen Niece, MD .   Documentation: I have reviewed the above documentation for accuracy and completeness, and I agree with the above.  Willeen Niece, MD

## 2022-11-07 ENCOUNTER — Ambulatory Visit: Payer: Medicare HMO | Admitting: Physician Assistant

## 2022-11-11 ENCOUNTER — Other Ambulatory Visit: Payer: Self-pay | Admitting: Nurse Practitioner

## 2022-11-11 DIAGNOSIS — G834 Cauda equina syndrome: Secondary | ICD-10-CM

## 2022-11-11 DIAGNOSIS — M4800 Spinal stenosis, site unspecified: Secondary | ICD-10-CM

## 2022-11-11 DIAGNOSIS — Z760 Encounter for issue of repeat prescription: Secondary | ICD-10-CM

## 2022-11-11 DIAGNOSIS — H34832 Tributary (branch) retinal vein occlusion, left eye, with macular edema: Secondary | ICD-10-CM | POA: Diagnosis not present

## 2022-11-11 DIAGNOSIS — M4316 Spondylolisthesis, lumbar region: Secondary | ICD-10-CM

## 2022-11-11 LAB — HM DIABETES EYE EXAM

## 2022-11-15 ENCOUNTER — Other Ambulatory Visit: Payer: Self-pay

## 2022-11-15 DIAGNOSIS — Z760 Encounter for issue of repeat prescription: Secondary | ICD-10-CM

## 2022-11-15 MED ORDER — PANTOPRAZOLE SODIUM 40 MG PO TBEC: DELAYED_RELEASE_TABLET | ORAL | 1 refills | Status: DC

## 2022-11-18 NOTE — Telephone Encounter (Signed)
Please review and send 

## 2022-11-25 ENCOUNTER — Telehealth: Payer: Self-pay

## 2022-11-26 MED ORDER — FUROSEMIDE 20 MG PO TABS: ORAL_TABLET | ORAL | 5 refills | Status: DC

## 2022-11-26 NOTE — Telephone Encounter (Signed)
refill 

## 2022-12-06 ENCOUNTER — Ambulatory Visit: Payer: Medicare HMO | Admitting: Podiatry

## 2022-12-09 ENCOUNTER — Ambulatory Visit: Payer: Medicare HMO | Admitting: Podiatry

## 2022-12-09 DIAGNOSIS — B351 Tinea unguium: Secondary | ICD-10-CM

## 2022-12-09 DIAGNOSIS — M79675 Pain in left toe(s): Secondary | ICD-10-CM | POA: Diagnosis not present

## 2022-12-09 DIAGNOSIS — L97511 Non-pressure chronic ulcer of other part of right foot limited to breakdown of skin: Secondary | ICD-10-CM

## 2022-12-09 DIAGNOSIS — M79674 Pain in right toe(s): Secondary | ICD-10-CM

## 2022-12-09 DIAGNOSIS — L97522 Non-pressure chronic ulcer of other part of left foot with fat layer exposed: Secondary | ICD-10-CM | POA: Diagnosis not present

## 2022-12-09 DIAGNOSIS — Z794 Long term (current) use of insulin: Secondary | ICD-10-CM

## 2022-12-09 DIAGNOSIS — E1140 Type 2 diabetes mellitus with diabetic neuropathy, unspecified: Secondary | ICD-10-CM

## 2022-12-09 MED ORDER — GENTAMICIN SULFATE 0.1 % EX CREA: TOPICAL_CREAM | CUTANEOUS | 1 refills | Status: AC

## 2022-12-09 NOTE — Progress Notes (Unsigned)
Subjective:  Patient ID: Kara [name], female    DOB: 1945-06-05,  MRN: [id number]  Kara [name] presents to clinic today for: at risk foot care with history of diabetic neuropathy and painful elongated mycotic toenails 1-5 bilaterally which are tender when wearing enclosed shoe gear. Pain is relieved with periodic professional debridement.  Chief Complaint  Patient presents with   Nail Problem    DFC,A1C:6.5,Referring Provider [name], NP,lov:1 week ago,BS:138      Patient concerned about right 2nd digit lesion. States she has h/o wound on this digit. She also states she hit her left foot on the bath scale and injured her toe. She has not treated the digits. Right 2nd digit is red at the distal tip.  PCP is [name], NP.           Allergies  Allergen Reactions   Amoxicillin Nausea And Vomiting    Review of Systems: Negative except as noted in the HPI.  Objective: No changes noted in today's physical examination. There were no vitals filed for this visit.  Kara [name] is a pleasant 77 y.o. female in NAD. AAO x 3.  Vascular Examination: Capillary refill time <3 seconds b/l LE. Palpable pedal pulses b/l LE. Digital hair present b/l. Trace pedal edema b/l. Skin temperature gradient WNL b/l. No varicosities b/l. Marland Kitchen  Dermatological Examination: Pedal skin with normal turgor, texture and tone b/l. No open wounds. No interdigital macerations b/l. Toenails 1-5 b/l thickened, discolored, dystrophic with subungual debris. There is pain on palpation to dorsal aspect of nailplates. .     Wound Location: R 2nd toe There is a moderate amount of devitalized tissue present in the wound. Predebridement Wound Measurement:  Significant amount of hyperkeratotic debris  Postdebridement Wound Measurement: 0.5 x 0.3 x 0.1 cm Wound Base: Granular/Healthy Peri-wound: Reddened Exudate: Scant/small amount Bloody exudate Blood Loss during debridement:  <1/2   cc('s). Material in wound which inhibits healing/promotes adjacent tissue breakdown:  exuberant hyperkeratosis. Description of tissue removed from ulceration today:  exuberant hyperkeratosis. Sign(s) of clinical bacterial infection: erythema   Wound Location: distal tip of left 3rd toe There is a moderate amount of devitalized tissue present in the wound. Predebridement Wound Measurement:  0.1  x 0.2 x 0.1cm. Postdebridement Wound Measurement: 0.2 x 0.3 x 0.2 cm. Wound Base: Granular/Healthy Peri-wound: Calloused Exudate: None: wound tissue dry Blood Loss during debridement: 0 cc('s). Material in wound which inhibits healing/promotes adjacent tissue breakdown:  nonviable hyperkeratosis. Description of tissue removed from ulceration today:  nonviable hyperkeratosis. Sign(s) of clinical bacterial infection: no clinical signs of infection noted on examination today.  Wound Location: {jgPodToeLocator:23637} There is a {minimal, moderate, considerable} amount of devitalized tissue present in the wound. Predebridement Wound Measurement:  ***  x *** x ***cm. Postdebridement Wound Measurement: *** x *** x ***cm. Wound Base: {findings; base ulcer:11056} Peri-wound: {Peri-wound:10864} Exudate: {:10862} Blood Loss during debridement: {NUMBERS; 0-10:5044} cc('s). Material in wound which inhibits healing/promotes adjacent tissue breakdown:  {jgtissue:24134}. Description of tissue removed from ulceration today:  {jgtissue:24134}. Sign(s) of clinical bacterial infection: {JGSignsofInfection:24077}   Neurological Examination: Protective sensation intact with 10 gram monofilament b/l LE. Vibratory sensation intact b/l LE.   Musculoskeletal Examination: {jgmsk:23600}     Latest Ref Rng & Units 08/05/2022    9:36 AM 05/03/2022    9:29 AM 01/31/2022   11:47 AM  Hemoglobin A1C  Hemoglobin-A1c 4.0 - 5.6 % 6.5  7.2  6.7    Assessment/Plan: 1. Pain due to onychomycosis  of toenails of both feet    2. Type 2 diabetes mellitus with diabetic neuropathy, with long-term current use of insulin (HCC)    -Continue foot and shoe inspections daily. Monitor blood glucose per PCP/Endocrinologist's recommendations. -Patient to continue soft, supportive shoe gear daily. -Toenails 1-5 b/l were debrided in length and girth with sterile nail nippers and dremel without iatrogenic bleeding.  -Patient/POA to call should there be question/concern in the interim.   Return in about 3 months (around 03/11/2023).  Freddie Breech, DPM

## 2022-12-09 NOTE — Patient Instructions (Signed)
DRESSING CHANGES RIGHT 2ND TOE AND LEFT 3RD TOE:   PHARMACY SHOPPING LIST: Saline or Wound Cleanser for cleaning wound 2 x 2 inch sterile gauze for cleaning wound GENTAMICIN CREAM  A. IF DISPENSED, WEAR SURGICAL SHOE OR WALKING BOOT AT ALL TIMES.  B. IF PRESCRIBED ORAL ANTIBIOTICS, TAKE ALL MEDICATION AS PRESCRIBED UNTIL ALL ARE GONE.  C. IF DOCTOR HAS DESIGNATED NONWEIGHTBEARING STATUS, PLEASE ADHERE TO INSTRUCTIONS.  CLEANSE ULCERS WITH SALINE, DIAL ANTIBACTERIAL SOAP OR WOUND CLEANSER.  DAB DRY WITH GAUZE SPONGE.  APPLY A LIGHT AMOUNT OF GENTAMICIN TO BASE OF ULCER.  APPLY OUTER DRESSING AS INSTRUCTED.  WEAR SURGICAL SHOE/BOOT DAILY AT ALL TIMES. IF SUPPLIED, WEAR HEEL PROTECTORS AT ALL TIMES WHEN IN BED.  DO NOT WALK BAREFOOT!!!  IF YOU EXPERIENCE ANY FEVER, CHILLS, NIGHTSWEATS, NAUSEA OR VOMITING, ELEVATED OR LOW BLOOD SUGARS, REPORT TO EMERGENCY ROOM.  IF YOU EXPERIENCE INCREASED REDNESS, PAIN, SWELLING, DISCOLORATION, ODOR, PUS, DRAINAGE OR WARMTH OF YOUR FOOT, REPORT TO EMERGENCY ROOM.

## 2022-12-11 ENCOUNTER — Encounter: Payer: Self-pay | Admitting: Podiatry

## 2022-12-16 DIAGNOSIS — H34832 Tributary (branch) retinal vein occlusion, left eye, with macular edema: Secondary | ICD-10-CM | POA: Diagnosis not present

## 2022-12-17 ENCOUNTER — Ambulatory Visit: Payer: Medicare HMO | Admitting: Podiatry

## 2022-12-17 DIAGNOSIS — L97511 Non-pressure chronic ulcer of other part of right foot limited to breakdown of skin: Secondary | ICD-10-CM | POA: Diagnosis not present

## 2022-12-17 DIAGNOSIS — M205X1 Other deformities of toe(s) (acquired), right foot: Secondary | ICD-10-CM | POA: Diagnosis not present

## 2022-12-17 NOTE — Progress Notes (Signed)
Subjective:  Patient ID: Kara [name], female    DOB: [birth year],  MRN: [id number]  Chief Complaint  Patient presents with   Foot Ulcer    77 y.o. female presents with the above complaint.  Patient presents for follow-up of multiple toe ulceration with primarily worst 1 at the second toe of the right foot.  She is known to Dr. Donzetta Matters.  She is here for follow-up and possible consideration of flexor tenotomy.  She would like to discuss treatment options   Review of Systems: Negative except as noted in the HPI. Denies N/V/F/Ch.  Past Medical History:  Diagnosis Date   Anemia    Arthropathy    Benign neoplasm of breast    Breast cancer (HCC)    DCIS   Chest pain    CKD (chronic kidney disease), stage IV (HCC)    Diabetes (HCC)    Diabetic retinopathy (HCC)    Dyspnea    Edema    History of kidney stones    Hyperlipemia    Hypersomnia with sleep apnea    Hypertension    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HCC)    Lumbago    Neuropathy    Osteoarthrosis    Osteoporosis    Ovarian failure    Personal history of radiation therapy    Pneumonia due to COVID-19 virus 12/21/2021   a.) required inpatient admission   SOB (shortness of breath)     Current Outpatient Medications:    Aflibercept (EYLEA) 2 MG/0.05ML SOLN, , Disp: , Rfl:    alendronate (FOSAMAX) 70 MG tablet, Take 1 tablet (70 mg total) by mouth once a week. Take with a full glass of water on an empty stomach., Disp: 12 tablet, Rfl: 3   Calcium-Magnesium-Vitamin D (CALCIUM 1200+D3 PO), Take 1 tablet by mouth daily., Disp: , Rfl:    Collagen-Boron-Hyaluronic Acid (MOVE FREE ULTRA JOINT HEALTH) 40-5-3.3 MG TABS, Take 1 tablet by mouth daily., Disp: , Rfl:    docusate sodium (COLACE) 100 MG capsule, Take 200 mg by mouth daily as needed. (Patient not taking: Reported on 12/09/2022), Disp: , Rfl:    doxycycline (MONODOX) 100 MG capsule, Take 1 capsule (100 mg total) by mouth 2 (two) times daily. Take with food., Disp: 60  capsule, Rfl: 5   Dulaglutide (TRULICITY) 1.5 MG/0.5ML SOPN, Inject 1.5 mg into the skin once a week. (Patient taking differently: Inject 1.5 mg into the skin once a week. Monday's), Disp: 6 mL, Rfl: 4   Ferrous Gluconate 324 (37.5 Fe) MG TABS, Take 324 mg by mouth daily., Disp: , Rfl:    furosemide (LASIX) 20 MG tablet, Take 2 tablets by mouth in the AM and take 1 tablet by mouth in the PM. May add 1 tablet to afternoon dose if swelling not improved., Disp: 120 tablet, Rfl: 5   gentamicin cream (GARAMYCIN) 0.1 %, Apply to ulcers once daily., Disp: 30 g, Rfl: 1   hydrocortisone 2.5 % cream, Apply to affected areas buttocks once to twice daily until improved., Disp: 30 g, Rfl: 0   insulin aspart (NOVOLOG FLEXPEN) 100 UNIT/ML FlexPen, INJECT [place] 3 times daily with meals AS DIRECTED PER SLIDING SCALE. MAX DAILY DOSE IS 30 UNITS. DX E11.65. (Patient taking differently: 4-14 Units 3 (three) times daily with meals. INJECT [place] 3 times daily with meals AS DIRECTED PER SLIDING SCALE. MAX DAILY DOSE IS 30 UNITS. DX E11.65.), Disp: 15 mL, Rfl: 3   insulin aspart (NOVOLOG FLEXPEN) 100 UNIT/ML FlexPen, INJECT UNDER THE  SKIN TWICE DAILY AS DIRECTED PER SLIDING SCALE. MAX DAILY DOSE IS 30 UNITS., Disp: 9 mL, Rfl: 2   insulin detemir (LEVEMIR FLEXPEN) 100 UNIT/ML FlexPen, INJECT UP TO 75 UNITS UNDER THE SKIN DAILY, TITRATE AS NEEDED. (Patient taking differently: 25 Units. INJECT UP TO 75 UNITS UNDER THE SKIN DAILY, TITRATE AS NEEDED.), Disp: 15 mL, Rfl: 2   ketoconazole (NIZORAL) 2 % cream, Apply to affected area buttocks twice daily until improved., Disp: 30 g, Rfl: 0   Krill Oil 500 MG CAPS, Take 1 capsule by mouth daily. (Patient not taking: Reported on 12/09/2022), Disp: , Rfl:    Lactobacillus (PROBIOTIC ACIDOPHILUS) CAPS, Take 1 capsule by mouth daily., Disp: , Rfl:    methocarbamol (ROBAXIN) 750 MG tablet, Take 1 tablet (750 mg total) by mouth every 6 (six) hours as needed for muscle spasms., Disp: 120 tablet, Rfl:  2   Multiple Vitamin (MULTIVITAMIN) tablet, Take 1 tablet by mouth daily at 12 noon., Disp: , Rfl:    oxyCODONE (OXY IR/ROXICODONE) 5 MG immediate release tablet, Take 1 tablet (5 mg total) by mouth every 6 (six) hours as needed for severe pain. (Patient not taking: Reported on 12/09/2022), Disp: 28 tablet, Rfl: 0   pantoprazole (PROTONIX) 40 MG tablet, TAKE 1 TABLET BY MOUTH EVERY DAY, Disp: 90 tablet, Rfl: 1   pregabalin (LYRICA) 100 MG capsule, TAKE 1 CAPSULE BY MOUTH TWICE A DAY, Disp: 60 capsule, Rfl: 2   rosuvastatin (CRESTOR) 20 MG tablet, TAKE 1 TABLET BY MOUTH EVERYDAY AT BEDTIME, Disp: 90 tablet, Rfl: 3   senna (SENOKOT) 8.6 MG TABS tablet, Take 1 tablet (8.6 mg total) by mouth 2 (two) times daily. (Patient not taking: Reported on 12/09/2022), Disp: 120 tablet, Rfl: 0   tamoxifen (NOLVADEX) 20 MG tablet, Take 1 tablet (20 mg total) by mouth daily., Disp: 90 tablet, Rfl: 3   traMADol-acetaminophen (ULTRACET) 37.5-325 MG tablet, Take 1 tablet by mouth every 6 (six) hours as needed for moderate pain or severe pain. This is a refill, Disp: 60 tablet, Rfl: 0  Social History   Tobacco Use  Smoking Status Former   Current packs/day: 0.00   Average packs/day: 1 pack/day for 30.0 years (30.0 ttl pk-yrs)   Types: Cigarettes   Start date: 04/10/1965   Quit date: 04/11/1995   Years since quitting: 27.7  Smokeless Tobacco Never    Allergies  Allergen Reactions   Amoxicillin Nausea And Vomiting   Objective:  There were no vitals filed for this visit. There is no height or weight on file to calculate BMI. Constitutional Well developed. Well nourished.  Vascular Dorsalis pedis pulses palpable bilaterally. Posterior tibial pulses palpable bilaterally. Capillary refill normal to all digits.  No cyanosis or clubbing noted. Pedal hair growth normal.  Neurologic Normal speech. Oriented to person, place, and time. Epicritic sensation to light touch grossly present bilaterally.  Dermatologic  Right second digit and left third digit toe ulceration completely epithelialized.  No further signs of ulceration noted.  Some of lexical hammertoe contractures noted of 2 through 5 bilaterally  Orthopedic: Normal joint ROM without pain or crepitus bilaterally. No visible deformities. No bony tenderness.   Radiographs: None Assessment:   1. Contracture of toe of right foot   2. Skin ulcer of second toe of right foot, limited to breakdown of skin (HCC)    Plan:  Patient was evaluated and treated and all questions answered.  Right second and left third digit ulceration healed -All questions and concerns were  discussed with the patient in extensive detail -I discussed with the patient that this may be temporarily healed however it can recur and patient will benefit from flexor tenotomy especially of the right second and left third digit.  She states she will think about the procedure and will get back to me when she is ready. -She will follow-up with Dr. Donzetta Matters for diabetic footcare  No follow-ups on file.

## 2022-12-19 ENCOUNTER — Ambulatory Visit (INDEPENDENT_AMBULATORY_CARE_PROVIDER_SITE_OTHER): Payer: Medicare HMO | Admitting: Nurse Practitioner

## 2022-12-19 ENCOUNTER — Encounter: Payer: Self-pay | Admitting: Nurse Practitioner

## 2022-12-19 VITALS — BP 135/70 | Temp 98.2°F | Resp 16 | Ht 60.0 in | Wt 186.6 lb

## 2022-12-19 DIAGNOSIS — G834 Cauda equina syndrome: Secondary | ICD-10-CM | POA: Diagnosis not present

## 2022-12-19 DIAGNOSIS — M4316 Spondylolisthesis, lumbar region: Secondary | ICD-10-CM | POA: Diagnosis not present

## 2022-12-19 DIAGNOSIS — Z794 Long term (current) use of insulin: Secondary | ICD-10-CM | POA: Diagnosis not present

## 2022-12-19 DIAGNOSIS — M4800 Spinal stenosis, site unspecified: Secondary | ICD-10-CM | POA: Diagnosis not present

## 2022-12-19 DIAGNOSIS — E1142 Type 2 diabetes mellitus with diabetic polyneuropathy: Secondary | ICD-10-CM | POA: Diagnosis not present

## 2022-12-19 DIAGNOSIS — L209 Atopic dermatitis, unspecified: Secondary | ICD-10-CM

## 2022-12-19 DIAGNOSIS — E1122 Type 2 diabetes mellitus with diabetic chronic kidney disease: Secondary | ICD-10-CM | POA: Diagnosis not present

## 2022-12-19 LAB — POCT GLYCOSYLATED HEMOGLOBIN (HGB A1C): Hemoglobin A1C: 8.3 % — AB (ref 4.0–5.6)

## 2022-12-19 MED ORDER — PREGABALIN 100 MG PO CAPS: 100.0000 mg | ORAL_CAPSULE | Freq: Two times a day (BID) | ORAL | 2 refills | Status: DC

## 2022-12-19 MED ORDER — PREGABALIN 100 MG PO CAPS: 100.0000 mg | ORAL_CAPSULE | Freq: Two times a day (BID) | ORAL | 0 refills | Status: DC

## 2022-12-19 MED ORDER — GLIPIZIDE ER 2.5 MG PO TB24: 2.5000 mg | ORAL_TABLET | Freq: Every day | ORAL | 1 refills | Status: DC

## 2022-12-19 MED ORDER — TRIAMCINOLONE ACETONIDE 0.025 % EX CREA: 1.0000 | TOPICAL_CREAM | Freq: Two times a day (BID) | CUTANEOUS | 5 refills | Status: DC | PRN

## 2022-12-19 MED ORDER — TRAMADOL-ACETAMINOPHEN 37.5-325 MG PO TABS: 1.0000 | ORAL_TABLET | Freq: Four times a day (QID) | ORAL | 0 refills | Status: DC | PRN

## 2022-12-19 NOTE — Progress Notes (Signed)
[facility] [address] [place], [place] [address]  Internal MEDICINE  Office Visit Note  Patient Name: Kara [name]  [id number]  [id number]  Date of Service: 12/19/2022  Chief Complaint  Patient presents with   Diabetes   Hypertension   Hyperlipidemia   Follow-up    HPI Kara [name] presents for a follow-up visit for diabetes, chronic pain and neuropathy.  Diabetes -- A1c is elevated due to steroid injections, A1c is 8.3 today. Taking trulicity injections.  Steroid eye injections -- getting injections every 3 to 5 weeks for diabetic retinopathy issues.  Neuropathy -- takes lyrica and tramadol Eczema -- requesting topical steroid    Current Medication: Outpatient Encounter Medications as of 12/19/2022  Medication Sig Note   Aflibercept (EYLEA) 2 MG/0.05ML SOLN  12/22/2021: Every 3 months   alendronate (FOSAMAX) 70 MG tablet Take 1 tablet (70 mg total) by mouth once a week. Take with a full glass of water on an empty stomach.    Calcium-Magnesium-Vitamin D (CALCIUM 1200+D3 PO) Take 1 tablet by mouth daily.    Collagen-Boron-Hyaluronic Acid (MOVE FREE ULTRA JOINT HEALTH) 40-5-3.3 MG TABS Take 1 tablet by mouth daily.    docusate sodium (COLACE) 100 MG capsule Take 200 mg by mouth daily as needed.    doxycycline (MONODOX) 100 MG capsule Take 1 capsule (100 mg total) by mouth 2 (two) times daily. Take with food.    Dulaglutide (TRULICITY) 1.5 MG/0.5ML SOPN Inject 1.5 mg into the skin once a week. (Patient taking differently: Inject 1.5 mg into the skin once a week. Monday's)    Ferrous Gluconate 324 (37.5 Fe) MG TABS Take 324 mg by mouth daily.    furosemide (LASIX) 20 MG tablet Take 2 tablets by mouth in the AM and take 1 tablet by mouth in the PM. May add 1 tablet to afternoon dose if swelling not improved.    gentamicin cream (GARAMYCIN) 0.1 % Apply to ulcers once daily.    glipiZIDE (GLUCOTROL XL) 2.5 MG 24 hr tablet Take 1 tablet (2.5 mg total) by mouth daily with  breakfast.    hydrocortisone 2.5 % cream Apply to affected areas buttocks once to twice daily until improved.    insulin aspart (NOVOLOG FLEXPEN) 100 UNIT/ML FlexPen INJECT [place] 3 times daily with meals AS DIRECTED PER SLIDING SCALE. MAX DAILY DOSE IS 30 UNITS. DX E11.65. (Patient taking differently: 4-14 Units 3 (three) times daily with meals. INJECT  3 times daily with meals AS DIRECTED PER SLIDING SCALE. MAX DAILY DOSE IS 30 UNITS. DX E11.65.)    insulin aspart (NOVOLOG FLEXPEN) 100 UNIT/ML FlexPen INJECT UNDER THE SKIN TWICE DAILY AS DIRECTED PER SLIDING SCALE. MAX DAILY DOSE IS 30 UNITS.    insulin detemir (LEVEMIR FLEXPEN) 100 UNIT/ML FlexPen INJECT UP TO 75 UNITS UNDER THE SKIN DAILY, TITRATE AS NEEDED. (Patient taking differently: 25 Units. INJECT UP TO 75 UNITS UNDER THE SKIN DAILY, TITRATE AS NEEDED.)    ketoconazole (NIZORAL) 2 % cream Apply to affected area buttocks twice daily until improved.    Krill Oil 500 MG CAPS Take 1 capsule by mouth daily.    Lactobacillus (PROBIOTIC ACIDOPHILUS) CAPS Take 1 capsule by mouth daily.    methocarbamol (ROBAXIN) 750 MG tablet Take 1 tablet (750 mg total) by mouth every 6 (six) hours as needed for muscle spasms.    Multiple Vitamin (MULTIVITAMIN) tablet Take 1 tablet by mouth daily at 12 noon.    pantoprazole (PROTONIX) 40 MG tablet TAKE 1 TABLET  BY MOUTH EVERY DAY    rosuvastatin (CRESTOR) 20 MG tablet TAKE 1 TABLET BY MOUTH EVERYDAY AT BEDTIME    senna (SENOKOT) 8.6 MG TABS tablet Take 1 tablet (8.6 mg total) by mouth 2 (two) times daily.    tamoxifen (NOLVADEX) 20 MG tablet Take 1 tablet (20 mg total) by mouth daily.    triamcinolone (KENALOG) 0.025 % cream Apply 1 Application topically 2 (two) times daily as needed (itching). To groin area    [DISCONTINUED] oxyCODONE (OXY IR/ROXICODONE) 5 MG immediate release tablet Take 1 tablet (5 mg total) by mouth every 6 (six) hours as needed for severe pain.    [DISCONTINUED] pregabalin (LYRICA) 100 MG  capsule TAKE 1 CAPSULE BY MOUTH TWICE A DAY    [DISCONTINUED] traMADol-acetaminophen (ULTRACET) 37.5-325 MG tablet Take 1 tablet by mouth every 6 (six) hours as needed for moderate pain or severe pain. This is a refill    pregabalin (LYRICA) 100 MG capsule Take 1 capsule (100 mg total) by mouth 2 (two) times daily.    traMADol-acetaminophen (ULTRACET) 37.5-325 MG tablet Take 1 tablet by mouth every 6 (six) hours as needed for moderate pain or severe pain. This is a refill    No facility-administered encounter medications on file as of 12/19/2022.    Surgical History: Past Surgical History:  Procedure Laterality Date   ANTERIOR LATERAL LUMBAR FUSION WITH PERCUTANEOUS SCREW 2 LEVEL N/A 07/03/2022   Procedure: L3-5 LATERAL LUMBAR INTERBODY FUSION WITH POSTERIOR SPINAL FUSION;  Surgeon: Venetia Night, MD;  Location: [facility] ORS;  Service: Neurosurgery;  Laterality: N/A;   APPLICATION OF INTRAOPERATIVE CT SCAN N/A 07/03/2022   Procedure: APPLICATION OF INTRAOPERATIVE CT SCAN;  Surgeon: Venetia Night, MD;  Location: [facility] ORS;  Service: Neurosurgery;  Laterality: N/A;   BILATERAL SALPINGECTOMY     BREAST BIOPSY Right 2014   neg- core   BREAST BIOPSY Right 12/21/2018   [place] bx, venus clip,  DUCTAL CARCINOMA IN SITU   BREAST BIOPSY Right 12/21/2018   [place] bx, ribbon clip,  FIBROEPITHELIAL PROLIFERATION WITH SCLEROSIS   BREAST BIOPSY Right 01/01/2019   Affirm bx "X" clip-path pending   BREAST DUCTAL SYSTEM EXCISION     BREAST EXCISIONAL BIOPSY Right 2014   neg   BREAST LUMPECTOMY Right 01/22/2019   path pending   CATARACT EXTRACTION     CHOLECYSTECTOMY     CYSTOSCOPY WITH STENT PLACEMENT Left 04/14/2015   Procedure: CYSTOSCOPY WITH STENT PLACEMENT;  Surgeon: Vanna Scotland, MD;  Location: [facility] OR[facility];  Service: Urology;  Laterality: Left;   EXTRACORPOREAL SHOCK WAVE LITHOTRIPSY Left 09/29/2014   Procedure: EXTRACORPOREAL SHOCK WAVE LITHOTRIPSY (ESWL);  Surgeon: Vanna Scotland, MD;  Location: [facility]  ORS;  Service: Urology;  Laterality: Left;   EYE SURGERY     bilateral cataract   IRRIGATION AND DEBRIDEMENT ABSCESS Right 03/24/2019   Procedure: IRRIGATION AND DEBRIDEMENT ABSCESS RIGHT BREAST;  Surgeon: [name], MD;  Location: [facility] OR[facility];  Service: General;  Laterality: Right;   LAPAROSCOPIC OOPHERECTOMY Left    PARTIAL MASTECTOMY WITH NEEDLE LOCALIZATION Right 01/22/2019   Procedure: PARTIAL MASTECTOMY WITH NEEDLE LOCALIZATION;  Surgeon: [name], MD;  Location: [facility];  Service: General;  Laterality: Right;   TONSILLECTOMY     TUBAL LIGATION     URETEROSCOPY WITH HOLMIUM LASER LITHOTRIPSY Left 04/14/2015   Procedure: URETEROSCOPY WITH HOLMIUM LASER LITHOTRIPSY;  Surgeon: Vanna Scotland, MD;  Location: [facility];  Service: Urology;  Laterality: Left;    Medical History: Past Medical History:  Diagnosis Date  Anemia    Arthropathy    Benign neoplasm of breast    Breast cancer (HCC)    DCIS   Chest pain    CKD (chronic kidney disease), stage IV (HCC)    Diabetes (HCC)    Diabetic retinopathy (HCC)    Dyspnea    Edema    History of kidney stones    Hyperlipemia    Hypersomnia with sleep apnea    Hypertension    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HCC)    Lumbago    Neuropathy    Osteoarthrosis    Osteoporosis    Ovarian failure    Personal history of radiation therapy    Pneumonia due to COVID-19 virus 12/21/2021   a.) required inpatient admission   SOB (shortness of breath)     Family History: Family History  Problem Relation Age of Onset   Breast cancer Maternal Grandmother 62   Colon cancer Mother    Lung cancer Father     Social History   Socioeconomic History   Marital status: Married    Spouse name: Not on file   Number of children: Not on file   Years of education: Not on file   Highest education level: Not on file  Occupational History   Not on file  Tobacco Use   Smoking status: Former    Current packs/day: 0.00     Average packs/day: 1 pack/day for 30.0 years (30.0 ttl pk-yrs)    Types: Cigarettes    Start date: 04/10/1965    Quit date: 04/11/1995    Years since quitting: 27.7   Smokeless tobacco: Never  Vaping Use   Vaping status: Never Used  Substance and Sexual Activity   Alcohol use: No    Alcohol/week: 0.0 standard drinks of alcohol   Drug use: No   Sexual activity: Not on file  Other Topics Concern   Not on file  Social History Narrative   Not on file   Social Determinants of Health   Financial Resource Strain: Low Risk  (10/03/2020)   Overall Financial Resource Strain (CARDIA)    Difficulty of Paying Living Expenses: Not very hard  Food Insecurity: No Food Insecurity (07/03/2022)   Hunger Vital Sign    Worried About Running Out of Food in the Last Year: Never true    Ran Out of Food in the Last Year: Never true  Transportation Needs: No Transportation Needs (07/03/2022)   PRAPARE - Administrator, Civil Service (Medical): No    Lack of Transportation (Non-Medical): No  Physical Activity: Not on file  Stress: Not on file  Social Connections: Not on file  Intimate Partner Violence: Not At Risk (07/03/2022)   Humiliation, Afraid, Rape, and Kick questionnaire    Fear of Current or Ex-Partner: No    Emotionally Abused: No    Physically Abused: No    Sexually Abused: No      Review of Systems  Constitutional:  Positive for fatigue. Negative for chills and unexpected weight change.  HENT:  Negative for congestion, rhinorrhea, sneezing and sore throat.   Eyes:  Negative for redness.  Respiratory: Negative.  Negative for cough, chest tightness, shortness of breath and wheezing.   Cardiovascular: Negative.  Negative for chest pain and palpitations.  Gastrointestinal: Negative.  Negative for abdominal pain, constipation, diarrhea, nausea and vomiting.  Genitourinary:  Negative for dysuria and frequency.  Musculoskeletal:  Positive for arthralgias and gait problem (using  cane). Negative for back pain, joint  swelling and neck pain.  Skin:  Negative for rash.  Neurological:  Negative for tremors and numbness.  Hematological:  Negative for adenopathy. Does not bruise/bleed easily.  Psychiatric/Behavioral:  Negative for behavioral problems (Depression), sleep disturbance and suicidal ideas. The patient is not nervous/anxious.     Vital Signs: BP 135/70 Comment: 152/68  Temp 98.2 F (36.8 C)   Resp 16   Ht 5' (1.524 m)   Wt 186 lb 9.6 oz (84.6 kg)   BMI 36.44 kg/m    Physical Exam Vitals reviewed.  Constitutional:      General: She is not in acute distress.    Appearance: Normal appearance. She is obese. She is not ill-appearing.  HENT:     Head: Normocephalic and atraumatic.  Eyes:     Pupils: Pupils are equal, round, and reactive to light.  Cardiovascular:     Rate and Rhythm: Normal rate and regular rhythm.  Pulmonary:     Effort: Pulmonary effort is normal. No respiratory distress.  Neurological:     Mental Status: She is alert and oriented to person, place, and time.  Psychiatric:        Mood and Affect: Mood normal.        Behavior: Behavior normal.        Assessment/Plan: 1. Type 2 diabetes mellitus with chronic kidney disease, with long-term current use of insulin, unspecified CKD stage (HCC) A1c is elevated due to steroid eye injections. Glipizide XL added to her treatment to help control her glucose level while still getting these steroid eye injections.  - POCT glycosylated hemoglobin (Hb A1C) - glipiZIDE (GLUCOTROL XL) 2.5 MG 24 hr tablet; Take 1 tablet (2.5 mg total) by mouth daily with breakfast.  Dispense: 90 tablet; Refill: 1  2. Diabetic polyneuropathy associated with type 2 diabetes mellitus (HCC) Continue lyrica as prescribed.  - pregabalin (LYRICA) 100 MG capsule; Take 1 capsule (100 mg total) by mouth 2 (two) times daily.  Dispense: 180 capsule; Refill: 0  3. Central stenosis of spinal canal Continue ultracet and  lyrica as prescribed  - traMADol-acetaminophen (ULTRACET) 37.5-325 MG tablet; Take 1 tablet by mouth every 6 (six) hours as needed for moderate pain or severe pain. This is a refill  Dispense: 60 tablet; Refill: 0 - pregabalin (LYRICA) 100 MG capsule; Take 1 capsule (100 mg total) by mouth 2 (two) times daily.  Dispense: 180 capsule; Refill: 0  4. Cauda equina syndrome (HCC) Continue ultracet and lyrica as prescribed  - traMADol-acetaminophen (ULTRACET) 37.5-325 MG tablet; Take 1 tablet by mouth every 6 (six) hours as needed for moderate pain or severe pain. This is a refill  Dispense: 60 tablet; Refill: 0 - pregabalin (LYRICA) 100 MG capsule; Take 1 capsule (100 mg total) by mouth 2 (two) times daily.  Dispense: 180 capsule; Refill: 0  5. Anterolisthesis of lumbar spine Continue ultracet as prescribed  - traMADol-acetaminophen (ULTRACET) 37.5-325 MG tablet; Take 1 tablet by mouth every 6 (six) hours as needed for moderate pain or severe pain. This is a refill  Dispense: 60 tablet; Refill: 0  6. Atopic dermatitis, unspecified type Triamcinolone prescribed. - triamcinolone (KENALOG) 0.025 % cream; Apply 1 Application topically 2 (two) times daily as needed (itching). To groin area  Dispense: 80 g; Refill: 5   General Counseling: Kara [name] verbalizes understanding of the findings of todays visit and agrees with plan of treatment. I have discussed any further diagnostic evaluation that may be needed or ordered today. We also reviewed her medications  today. she has been encouraged to call the office with any questions or concerns that should arise related to todays visit.    Orders Placed This Encounter  Procedures   POCT glycosylated hemoglobin (Hb A1C)    Meds ordered this encounter  Medications   traMADol-acetaminophen (ULTRACET) 37.5-325 MG tablet    Sig: Take 1 tablet by mouth every 6 (six) hours as needed for moderate pain or severe pain. This is a refill    Dispense:  60 tablet     Refill:  0    Not to exceed 5 additional fills before 03/23/2023   pregabalin (LYRICA) 100 MG capsule    Sig: Take 1 capsule (100 mg total) by mouth 2 (two) times daily.    Dispense:  60 capsule    Refill:  2    Not to exceed 5 additional fills before 03/23/2023   triamcinolone (KENALOG) 0.025 % cream    Sig: Apply 1 Application topically 2 (two) times daily as needed (itching). To groin area    Dispense:  80 g    Refill:  5   glipiZIDE (GLUCOTROL XL) 2.5 MG 24 hr tablet    Sig: Take 1 tablet (2.5 mg total) by mouth daily with breakfast.    Dispense:  90 tablet    Refill:  1    Fill new script today    Return in about 12 weeks (around 03/13/2023) for F/U, pain med refill, Kara [name] PCP.   Total time spent:30 Minutes Time spent includes review of chart, medications, test results, and follow up plan with the patient.   [place] Controlled Substance Database was reviewed by me.  This patient was seen by [name], FNP-C in collaboration with Dr. Beverely Risen as a part of collaborative care agreement.   Maimouna Rondeau R. Tedd Sias, MSN, FNP-C Internal medicine

## 2022-12-20 ENCOUNTER — Other Ambulatory Visit: Payer: Self-pay

## 2022-12-20 DIAGNOSIS — M4316 Spondylolisthesis, lumbar region: Secondary | ICD-10-CM

## 2022-12-24 ENCOUNTER — Ambulatory Visit: Payer: Medicare HMO | Admitting: Neurosurgery

## 2022-12-24 ENCOUNTER — Ambulatory Visit
Admission: RE | Admit: 2022-12-24 | Discharge: 2022-12-24 | Disposition: A | Discharge status: Home / Self Care | Payer: Medicare HMO | Source: Ambulatory Visit | Attending: Neurosurgery | Admitting: Neurosurgery

## 2022-12-24 ENCOUNTER — Encounter: Payer: Self-pay | Admitting: Neurosurgery

## 2022-12-24 ENCOUNTER — Ambulatory Visit
Admission: RE | Admit: 2022-12-24 | Discharge: 2022-12-24 | Disposition: A | Discharge status: Home / Self Care | Payer: Medicare HMO | Attending: Neurosurgery | Admitting: Neurosurgery

## 2022-12-24 VITALS — BP 130/72 | Ht 60.0 in | Wt 186.0 lb

## 2022-12-24 DIAGNOSIS — M4316 Spondylolisthesis, lumbar region: Secondary | ICD-10-CM

## 2022-12-24 DIAGNOSIS — M48062 Spinal stenosis, lumbar region with neurogenic claudication: Secondary | ICD-10-CM

## 2022-12-24 DIAGNOSIS — Z09 Encounter for follow-up examination after completed treatment for conditions other than malignant neoplasm: Secondary | ICD-10-CM

## 2022-12-24 DIAGNOSIS — Z981 Arthrodesis status: Secondary | ICD-10-CM | POA: Diagnosis not present

## 2022-12-24 NOTE — Progress Notes (Signed)
   REFERRING PHYSICIAN:  Burtis Junes [address] [place],  [place] [address]  DOS: 07/03/22  L3-L5 XLIF and PSF   HISTORY OF PRESENT ILLNESS: Kara [name] is doing very well.  She has a limited back and leg pain.  She is much improved compared to before surgery.    PHYSICAL EXAMINATION:  General: Patient is well developed, well nourished, calm, collected, and in no apparent distress.   NEUROLOGICAL:  General: In no acute distress.   Awake, alert, oriented to person, place, and time.  Pupils equal round and reactive to light.  Facial tone is symmetric.     Strength:            Side Iliopsoas Quads Hamstring PF DF EHL  R 5 5 5 5 5 5   L 5 5 5 5 5 5    Incisions well healed.   She has a normal gait.    ROS (Neurologic):  Negative except as noted above  IMAGING: Lumbar xrays dated 6/4/2:  No complications noted.   X-rays from December 24, 2022 showed no complications   ASSESSMENT/PLAN:  Kara [name] is doing well s/p above surgery.  I am very pleased with her improvements from surgery.  Will see her back in approximately 6 months with x-rays.    Venetia Night MD Department of neurosurgery

## 2023-01-18 ENCOUNTER — Other Ambulatory Visit: Payer: Self-pay | Admitting: Dermatology

## 2023-01-20 DIAGNOSIS — H34832 Tributary (branch) retinal vein occlusion, left eye, with macular edema: Secondary | ICD-10-CM | POA: Diagnosis not present

## 2023-01-27 ENCOUNTER — Encounter: Payer: Medicare HMO | Admitting: Dermatology

## 2023-02-03 ENCOUNTER — Ambulatory Visit
Admission: RE | Admit: 2023-02-03 | Discharge: 2023-02-03 | Disposition: A | Discharge status: Home / Self Care | Payer: Medicare HMO | Source: Ambulatory Visit | Attending: Oncology | Admitting: Oncology

## 2023-02-03 DIAGNOSIS — D0511 Intraductal carcinoma in situ of right breast: Secondary | ICD-10-CM | POA: Insufficient documentation

## 2023-02-03 DIAGNOSIS — Z1231 Encounter for screening mammogram for malignant neoplasm of breast: Secondary | ICD-10-CM | POA: Diagnosis not present

## 2023-02-06 ENCOUNTER — Inpatient Hospital Stay: Payer: Medicare HMO | Attending: Oncology | Admitting: Oncology

## 2023-02-06 DIAGNOSIS — D0511 Intraductal carcinoma in situ of right breast: Secondary | ICD-10-CM

## 2023-02-06 NOTE — Progress Notes (Signed)
[facility]  [phone]) [phone] [phone]) [phone]  ID: Kara [name] OB: 1945/05/04  MR#: [id number]  UXL#:[id number]  Patient Care Team: [name], NP as PCP - General (Nurse Practitioner) [name], MD as Consulting Physician (Oncology) [name], MD as Consulting Physician (General Surgery) [name], MD as Referring Physician (Radiation Oncology) [name], [facility] (Inactive) as Pharmacist (Pharmacist)  I connected with Kara [name] on 02/06/23 at  3:30 PM EDT by video enabled telemedicine visit and verified that I am speaking with the correct person using two identifiers.   I discussed the limitations, risks, security and privacy concerns of performing an evaluation and management service by telemedicine and the availability of in-person appointments. I also discussed with the patient that there may be a patient responsible charge related to this service. The patient expressed understanding and agreed to proceed.   Other persons participating in the visit and their role in the encounter: Patient, MD.  Patient's location: Home. Provider's location: Clinic.  CHIEF COMPLAINT: Low-grade DCIS of the right breast.  INTERVAL HISTORY: Patient agreed to video assisted telemedicine visit for her routine 19-month evaluation.  She currently feels well and is asymptomatic.  She is nearly fully recovered from her back surgery.  She continues to get intermittent treatment for her bullous pemphigoid, but reports that she has had none in 6 months.  She continues to tolerate tamoxifen without significant side effects. She has no neurologic complaints.  She denies any recent fevers or illnesses.  She has a good appetite and denies weight loss.  She has no chest pain, shortness of breath, cough, or hemoptysis.  She denies any nausea, vomiting, constipation, or diarrhea.  She has no urinary complaints.  Patient offers no specific complaints  today.  REVIEW OF SYSTEMS:   Review of Systems  Constitutional: Negative.  Negative for fever, malaise/fatigue and weight loss.  Respiratory: Negative.  Negative for cough, hemoptysis and shortness of breath.   Cardiovascular: Negative.  Negative for chest pain and leg swelling.  Gastrointestinal: Negative.  Negative for abdominal pain.  Genitourinary: Negative.  Negative for dysuria.  Musculoskeletal: Negative.  Negative for back pain and joint pain.  Skin:  Negative for itching and rash.  Neurological: Negative.  Negative for dizziness, focal weakness, weakness and headaches.  Psychiatric/Behavioral: Negative.  The patient is not nervous/anxious.     As per HPI. Otherwise, a complete review of systems is negative.  PAST MEDICAL HISTORY: Past Medical History:  Diagnosis Date   Anemia    Arthropathy    Benign neoplasm of breast    Breast cancer (HCC)    DCIS   Chest pain    CKD (chronic kidney disease), stage IV (HCC)    Diabetes (HCC)    Diabetic retinopathy (HCC)    Dyspnea    Edema    History of kidney stones    Hyperlipemia    Hypersomnia with sleep apnea    Hypertension    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HCC)    Lumbago    Neuropathy    Osteoarthrosis    Osteoporosis    Ovarian failure    Personal history of radiation therapy    Pneumonia due to COVID-19 virus 12/21/2021   a.) required inpatient admission   SOB (shortness of breath)     PAST SURGICAL HISTORY: Past Surgical History:  Procedure Laterality Date   ANTERIOR LATERAL LUMBAR FUSION WITH PERCUTANEOUS SCREW 2 LEVEL N/A 07/03/2022   Procedure: L3-5 LATERAL LUMBAR INTERBODY FUSION WITH  POSTERIOR SPINAL FUSION;  Surgeon: Venetia Night, MD;  Location: [facility] ORS;  Service: Neurosurgery;  Laterality: N/A;   APPLICATION OF INTRAOPERATIVE CT SCAN N/A 07/03/2022   Procedure: APPLICATION OF INTRAOPERATIVE CT SCAN;  Surgeon: Venetia Night, MD;  Location: [facility];  Service: Neurosurgery;  Laterality:  N/A;   BILATERAL SALPINGECTOMY     BREAST BIOPSY Right 2014   neg- core   BREAST BIOPSY Right 12/21/2018   [place] bx, venus clip,  DUCTAL CARCINOMA IN SITU   BREAST BIOPSY Right 12/21/2018   [place] bx, ribbon clip,  FIBROEPITHELIAL PROLIFERATION WITH SCLEROSIS   BREAST BIOPSY Right 01/01/2019   Affirm bx "X" clip-path pending   BREAST DUCTAL SYSTEM EXCISION     BREAST EXCISIONAL BIOPSY Right 2014   neg   BREAST LUMPECTOMY Right 01/22/2019   path pending   CATARACT EXTRACTION     CHOLECYSTECTOMY     CYSTOSCOPY WITH STENT PLACEMENT Left 04/14/2015   Procedure: CYSTOSCOPY WITH STENT PLACEMENT;  Surgeon: Vanna Scotland, MD;  Location: [facility];  Service: Urology;  Laterality: Left;   EXTRACORPOREAL SHOCK WAVE LITHOTRIPSY Left 09/29/2014   Procedure: EXTRACORPOREAL SHOCK WAVE LITHOTRIPSY (ESWL);  Surgeon: Vanna Scotland, MD;  Location: [facility];  Service: Urology;  Laterality: Left;   EYE SURGERY     bilateral cataract   IRRIGATION AND DEBRIDEMENT ABSCESS Right 03/24/2019   Procedure: IRRIGATION AND DEBRIDEMENT ABSCESS RIGHT BREAST;  Surgeon: [name], MD;  Location: [facility];  Service: General;  Laterality: Right;   LAPAROSCOPIC OOPHERECTOMY Left    PARTIAL MASTECTOMY WITH NEEDLE LOCALIZATION Right 01/22/2019   Procedure: PARTIAL MASTECTOMY WITH NEEDLE LOCALIZATION;  Surgeon: [name], MD;  Location: [facility];  Service: General;  Laterality: Right;   TONSILLECTOMY     TUBAL LIGATION     URETEROSCOPY WITH HOLMIUM LASER LITHOTRIPSY Left 04/14/2015   Procedure: URETEROSCOPY WITH HOLMIUM LASER LITHOTRIPSY;  Surgeon: Vanna Scotland, MD;  Location: [facility];  Service: Urology;  Laterality: Left;    FAMILY HISTORY: Family History  Problem Relation Age of Onset   Breast cancer Maternal Grandmother 47   Colon cancer Mother    Lung cancer Father     ADVANCED DIRECTIVES (Y/N):  N  HEALTH MAINTENANCE: Social History   Tobacco Use   Smoking status: Former    Current  packs/day: 0.00    Average packs/day: 1 pack/day for 30.0 years (30.0 ttl pk-yrs)    Types: Cigarettes    Start date: 04/10/1965    Quit date: 04/11/1995    Years since quitting: 27.8   Smokeless tobacco: Never  Vaping Use   Vaping status: Never Used  Substance Use Topics   Alcohol use: No    Alcohol/week: 0.0 standard drinks of alcohol   Drug use: No     Colonoscopy:  PAP:  Bone density:  Lipid panel:  Allergies  Allergen Reactions   Amoxicillin Nausea And Vomiting    Current Outpatient Medications  Medication Sig Dispense Refill   Aflibercept (EYLEA) 2 MG/0.05ML SOLN      Calcium-Magnesium-Vitamin D (CALCIUM 1200+D3 PO) Take 1 tablet by mouth daily.     Collagen-Boron-Hyaluronic Acid (MOVE FREE ULTRA JOINT HEALTH) 40-5-3.3 MG TABS Take 1 tablet by mouth daily.     docusate sodium (COLACE) 100 MG capsule Take 200 mg by mouth daily as needed.     doxycycline (MONODOX) 100 MG capsule Take 1 capsule (100 mg total) by mouth 2 (two) times daily. Take with food. 60 capsule 5   Dulaglutide (TRULICITY) 1.5  MG/0.5ML SOPN Inject 1.5 mg into the skin once a week. (Patient taking differently: Inject 1.5 mg into the skin once a week. Monday's) 6 mL 4   Ferrous Gluconate 324 (37.5 Fe) MG TABS Take 324 mg by mouth daily.     furosemide (LASIX) 20 MG tablet Take 2 tablets by mouth in the AM and take 1 tablet by mouth in the PM. May add 1 tablet to afternoon dose if swelling not improved. 120 tablet 5   gentamicin cream (GARAMYCIN) 0.1 % Apply to ulcers once daily. 30 g 1   glipiZIDE (GLUCOTROL XL) 2.5 MG 24 hr tablet Take 1 tablet (2.5 mg total) by mouth daily with breakfast. 90 tablet 1   hydrocortisone 2.5 % cream APPLY TO AFFECTED AREAS BUTTOCKS ONCE TO TWICE DAILY UNTIL IMPROVED. 28 g 0   insulin aspart (NOVOLOG FLEXPEN) 100 UNIT/ML FlexPen INJECT [place] 3 times daily with meals AS DIRECTED PER SLIDING SCALE. MAX DAILY DOSE IS 30 UNITS. DX E11.65. (Patient taking differently: 4-14 Units 3  (three) times daily with meals. INJECT  3 times daily with meals AS DIRECTED PER SLIDING SCALE. MAX DAILY DOSE IS 30 UNITS. DX E11.65.) 15 mL 3   insulin aspart (NOVOLOG FLEXPEN) 100 UNIT/ML FlexPen INJECT UNDER THE SKIN TWICE DAILY AS DIRECTED PER SLIDING SCALE. MAX DAILY DOSE IS 30 UNITS. 9 mL 2   insulin detemir (LEVEMIR FLEXPEN) 100 UNIT/ML FlexPen INJECT UP TO 75 UNITS UNDER THE SKIN DAILY, TITRATE AS NEEDED. (Patient taking differently: 25 Units. INJECT UP TO 75 UNITS UNDER THE SKIN DAILY, TITRATE AS NEEDED.) 15 mL 2   ketoconazole (NIZORAL) 2 % cream Apply to affected area buttocks twice daily until improved. 30 g 0   Krill Oil 500 MG CAPS Take 1 capsule by mouth daily.     Lactobacillus (PROBIOTIC ACIDOPHILUS) CAPS Take 1 capsule by mouth daily.     methocarbamol (ROBAXIN) 750 MG tablet Take 1 tablet (750 mg total) by mouth every 6 (six) hours as needed for muscle spasms. 120 tablet 2   Multiple Vitamin (MULTIVITAMIN) tablet Take 1 tablet by mouth daily at 12 noon.     pantoprazole (PROTONIX) 40 MG tablet TAKE 1 TABLET BY MOUTH EVERY DAY 90 tablet 1   pregabalin (LYRICA) 100 MG capsule Take 1 capsule (100 mg total) by mouth 2 (two) times daily. 180 capsule 0   rosuvastatin (CRESTOR) 20 MG tablet TAKE 1 TABLET BY MOUTH EVERYDAY AT BEDTIME 90 tablet 3   senna (SENOKOT) 8.6 MG TABS tablet Take 1 tablet (8.6 mg total) by mouth 2 (two) times daily. 120 tablet 0   tamoxifen (NOLVADEX) 20 MG tablet Take 1 tablet (20 mg total) by mouth daily. 90 tablet 3   traMADol-acetaminophen (ULTRACET) 37.5-325 MG tablet Take 1 tablet by mouth every 6 (six) hours as needed for moderate pain or severe pain. This is a refill 60 tablet 0   triamcinolone (KENALOG) 0.025 % cream Apply 1 Application topically 2 (two) times daily as needed (itching). To groin area 80 g 5   No current facility-administered medications for this visit.    OBJECTIVE: There were no vitals filed for this visit.   There is no height or  weight on file to calculate BMI.    ECOG FS:0 - Asymptomatic  General: Well-developed, well-nourished, no acute distress. HEENT: Normocephalic. Neuro: Alert, answering all questions appropriately. Cranial nerves grossly intact. Psych: Normal affect.  LAB RESULTS:  Lab Results  Component Value Date   NA 140 09/03/2022  K 4.4 09/03/2022   CL 100 09/03/2022   CO2 26 09/03/2022   GLUCOSE 125 (H) 09/03/2022   BUN 33 (H) 09/03/2022   CREATININE 1.35 (H) 09/03/2022   CALCIUM 9.1 09/03/2022   PROT 6.0 07/29/2022   ALBUMIN 3.3 (L) 07/29/2022   AST 9 07/29/2022   ALT 9 07/29/2022   ALKPHOS 120 07/29/2022   BILITOT 0.2 07/29/2022   GFRNONAA 45 (L) 06/25/2022   GFRAA 39 (L) 08/16/2019    Lab Results  Component Value Date   WBC 4.6 07/29/2022   NEUTROABS 2.4 07/29/2022   HGB 9.5 (L) 07/29/2022   HCT 31.4 (L) 07/29/2022   MCV 89 07/29/2022   PLT 180 07/29/2022     STUDIES: MM 3D SCREENING MAMMOGRAM BILATERAL BREAST  Result Date: 02/05/2023 CLINICAL DATA:  Screening. EXAM: DIGITAL SCREENING BILATERAL MAMMOGRAM WITH TOMOSYNTHESIS AND CAD TECHNIQUE: Bilateral screening digital craniocaudal and mediolateral oblique mammograms were obtained. Bilateral screening digital breast tomosynthesis was performed. The images were evaluated with computer-aided detection. COMPARISON:  Previous exam(s). ACR Breast Density Category b: There are scattered areas of fibroglandular density. FINDINGS: There are no findings suspicious for malignancy. IMPRESSION: No mammographic evidence of malignancy. A result letter of this screening mammogram will be mailed directly to the patient. RECOMMENDATION: Screening mammogram in one year. (Code:SM-B-01Y) BI-RADS CATEGORY  1: Negative. Electronically Signed   By: Frederico Hamman M.D.   On: 02/05/2023 14:24    ASSESSMENT: Low-grade DCIS of the right breast.  PLAN:    Low-grade DCIS of the right breast: Patient declined enrollment in COMET trial.  She underwent  lumpectomy on January 22, 2019.  Final pathology noted close, but clear margins.  Patient initiated XRT, but then discontinued after approximately 3 weeks. She was extremely upset how she was treated by the radiation oncology provider and refused to return or continue XRT.  We previously discussed the possibility of transferring care to another facility which she also declined.  She acknowledged that although she has low-grade DCIS, this increases her risk of recurrence.  No further interventions are needed.  Continue tamoxifen for total of 5 years completing treatment in December 2025.  Her most recent mammogram on February 03, 2023 was reported as BI-RADS 1.  Return to clinic in 6 months with video-assisted telemedicine visit.    Bullous pemphigoid: Patient continues to require intermittent treatment.  Continue follow-up with dermatology as indicated. Osteoporosis: Managed by primary care.  Patient discontinued Fosamax secondary to intolerance.   Back pain/surgery: Resolved.  Continue follow-up with neurosurgery as indicated.  I provided 20 minutes of face-to-face video visit time during this encounter which included chart review, counseling, and coordination of care as documented above.   Patient expressed understanding and was in agreement with this plan. She also understands that She can call clinic at any time with any questions, concerns, or complaints.    Cancer Staging  Ductal carcinoma in situ (DCIS) of right breast Staging form: Breast, AJCC 8th Edition - Clinical stage from 01/01/2019: Stage 0 (cTis (DCIS), cN0, cM0, G1, ER+, PR: Not Assessed, HER2: Not Assessed) - Signed by [name], MD on 01/01/2019 Stage prefix: Initial diagnosis Histologic grading system: 3 grade system   [name], MD   02/06/2023 3:56 PM

## 2023-02-10 ENCOUNTER — Encounter: Payer: Self-pay | Admitting: Podiatry

## 2023-02-10 ENCOUNTER — Ambulatory Visit (INDEPENDENT_AMBULATORY_CARE_PROVIDER_SITE_OTHER): Payer: Medicare HMO | Admitting: Podiatry

## 2023-02-10 DIAGNOSIS — Z794 Long term (current) use of insulin: Secondary | ICD-10-CM

## 2023-02-10 DIAGNOSIS — E1140 Type 2 diabetes mellitus with diabetic neuropathy, unspecified: Secondary | ICD-10-CM

## 2023-02-10 DIAGNOSIS — M79674 Pain in right toe(s): Secondary | ICD-10-CM

## 2023-02-10 DIAGNOSIS — B351 Tinea unguium: Secondary | ICD-10-CM

## 2023-02-10 DIAGNOSIS — L97511 Non-pressure chronic ulcer of other part of right foot limited to breakdown of skin: Secondary | ICD-10-CM

## 2023-02-10 DIAGNOSIS — M79675 Pain in left toe(s): Secondary | ICD-10-CM

## 2023-02-10 NOTE — Progress Notes (Signed)
Subjective:  Patient ID: Kara [name], female    DOB: 09-12-1945,  MRN: [id number]  Kara [name] presents to clinic today for at risk foot care with history of diabetic neuropathy and preulcerative lesion(s) of both feet. Pain prevent(s) comfortable ambulation. Aggravating factor is weightbearing with and without shoegear. Symptoms relieved with periodic professional debridement.   Her husband is present during today's visit. Patient has seen Dr. Allena Katz for follow up. She states she has seen blood on her socks. This started about 2 weeks ago. States she has not been putting a dressing on her toes because she cannot reach them. She continues to take Doxycycline 100 mg po bid for skin condition. She denies any fever, chills, night sweats, nausea or vomiting.  PCP is [name], NP.  Allergies  Allergen Reactions   Amoxicillin Nausea And Vomiting    Review of Systems: Negative except as noted in the HPI.  Objective: No changes noted in today's physical examination. There were no vitals filed for this visit. KYMBERLEIGH GAMBRELL is a pleasant 77 y.o. female WD, WN in NAD. AAO x 3.  Vascular Examination: Capillary refill time <3 seconds b/l LE. Palpable pedal pulses b/l LE. Digital hair present b/l. Trace pedal edema b/l. Skin temperature gradient WNL b/l. No varicosities b/l.  Dermatological Examination: Pedal skin with normal turgor, texture and tone b/l. No interdigital macerations b/l. Toenails 1-5 b/l thickened, discolored, dystrophic with subungual debris. There is pain on palpation to dorsal aspect of nailplates.    Wound Location: distal tip of right 2nd toe There is a moderate amount of devitalized tissue present in the wound. Predebridement Wound Measurement:  0.6  x 1.0 x 0.2 cm. Postdebridement Wound Measurement: 1.0 x 1.2 x 0.2 cm. Wound Base: granular Peri-wound: Calloused Exudate: None: wound tissue dry Blood Loss during debridement: 1 cc('s). Material in wound which  inhibits healing/promotes adjacent tissue breakdown:  necrotic tissue. Description of tissue removed from ulceration today:  necrotic tissue. Sign(s) of clinical bacterial infection: no clinical signs of infection noted on examination today.  Wound Location: distal tip of left 3rd toe There is a minimal amount of devitalized tissue present in the wound. Predebridement Wound Measurement:  0.3  x 0.2 x 0.1cm. Postdebridement Wound Measurement: 0.4 x 0.3 x 0.1cm. Wound Base: Granular/Healthy Peri-wound: Calloused Exudate: None: wound tissue dry Blood Loss during debridement: 0 cc('s). Material in wound which inhibits healing/promotes adjacent tissue breakdown:  nonviable hyperkeratosis. Description of tissue removed from ulceration today:  nonviable hyperkeratosis. Sign(s) of clinical bacterial infection: no clinical signs of infection noted on examination today.  Neurological Examination: Protective sensation intact with 10 gram monofilament b/l LE. Vibratory sensation intact b/l LE.   Musculoskeletal Examination: Muscle strength 5/5 to all lower extremity muscle groups bilaterally. Hammertoe deformity noted 2-5 b/l.  Assessment/Plan: 1. Pain due to onychomycosis of toenails of both feet   2. Skin ulcer of second toe of right foot, limited to breakdown of skin (HCC)   3. Corns   4. Type 2 diabetes mellitus with diabetic neuropathy, with long-term current use of insulin (HCC)     Plan: -Patient was evaluated and treated and all questions answered.  -Patient/POA/Family member educated on diagnosis and treatment plan of routine ulcer debridement/wound care.  -Ulceration debridement achieved utilizing sharp excisional debridement with sterile scalpel blade. Type/amount of devitalized tissue removed: necrotic tissue -Ulceration cleansed with wound cleanser. Triple antibiotic ointment applied to base of ulceration and secured with light dressing. -Wounds responded well to today's  debridement. Kara [name] given written instructions on daily wound care for  bilateral toe ulcerations, right 2nd and left 3rd toes. -Dispensed Darco shoe for right foot; patient signed ABN for DME. -Preulcerative lesion pared R 3rd toe utilizing sterile scalpel blade. Total number pared=1. -Patient scheduled with Dr. Allena Katz tomorrow for xray/evaluation. -Patient/POA to call should there be question/concern in the interim.   Return in about 3 months (around 05/13/2023).  Freddie Breech, DPM

## 2023-02-10 NOTE — Patient Instructions (Signed)
SEE DR. PATEL ON TOMORROW FOR XRAYS.  DRESSING CHANGES BILATERAL 2ND TOES:   PHARMACY SHOPPING LIST: Wound Cleanser for cleaning wound 2 x 2 inch sterile gauze for cleaning wound 1-INCH FABRIC BAND-AIDS Gentamicin Cream  A. IF DISPENSED, WEAR SURGICAL SHOE OR WALKING BOOT AT ALL TIMES.  B. IF PRESCRIBED ORAL ANTIBIOTICS, TAKE ALL MEDICATION AS PRESCRIBED UNTIL ALL ARE GONE.  C. IF DOCTOR HAS DESIGNATED NONWEIGHTBEARING STATUS, PLEASE ADHERE TO INSTRUCTIONS.  KEEP WOUND COVERED AT ALL TIMES.  CLEANSE ULCER WITH WOUND CLEANSER.  DAB DRY WITH GAUZE SPONGE.  APPLY A LIGHT AMOUNT OF Gentamicin Cream TO BASE OF ULCER.  APPLY OUTER DRESSING AS INSTRUCTED.  WEAR SURGICAL SHOE/BOOT DAILY AT ALL TIMES. IF SUPPLIED, WEAR HEEL PROTECTORS AT ALL TIMES WHEN IN BED.  DO NOT WALK BAREFOOT!!!  IF YOU EXPERIENCE ANY FEVER, CHILLS, NIGHTSWEATS, NAUSEA OR VOMITING, ELEVATED OR LOW BLOOD SUGARS, REPORT TO EMERGENCY ROOM.  IF YOU EXPERIENCE INCREASED REDNESS, PAIN, SWELLING, DISCOLORATION, ODOR, PUS, DRAINAGE OR WARMTH OF YOUR FOOT, REPORT TO EMERGENCY ROOM.

## 2023-02-11 ENCOUNTER — Ambulatory Visit: Payer: Medicare HMO | Admitting: Podiatry

## 2023-02-11 ENCOUNTER — Encounter: Payer: Self-pay | Admitting: Podiatry

## 2023-02-11 ENCOUNTER — Ambulatory Visit (INDEPENDENT_AMBULATORY_CARE_PROVIDER_SITE_OTHER): Payer: Medicare HMO

## 2023-02-11 VITALS — BP 194/85 | HR 80

## 2023-02-11 DIAGNOSIS — L97511 Non-pressure chronic ulcer of other part of right foot limited to breakdown of skin: Secondary | ICD-10-CM | POA: Diagnosis not present

## 2023-02-11 DIAGNOSIS — M205X1 Other deformities of toe(s) (acquired), right foot: Secondary | ICD-10-CM

## 2023-02-11 DIAGNOSIS — E1140 Type 2 diabetes mellitus with diabetic neuropathy, unspecified: Secondary | ICD-10-CM | POA: Diagnosis not present

## 2023-02-11 DIAGNOSIS — Z794 Long term (current) use of insulin: Secondary | ICD-10-CM

## 2023-02-11 NOTE — Progress Notes (Signed)
Subjective:  Patient ID: Kara [name], female    DOB: Sep 03, 1945,  MRN: [id number]  Chief Complaint  Patient presents with   Wound Check    "I think it's alright.  I have Neuropathy, so I can hardly feel it."    77 y.o. female presents with the above complaint.  Patient presents for right second digit toe contracture with superficial breakdown of the skin.  Patient said there is an ulceration present.  Patient was known to Dr. Donzetta Matters who referred the patient over to me.  The ulceration has been present for quite some time.  They have been keeping covered with triple antibiotic.  She would like to discuss treatment options.  She has neuropathy and cannot feel it.  Denies any other acute complaints.   Review of Systems: Negative except as noted in the HPI. Denies N/V/F/Ch.  Past Medical History:  Diagnosis Date   Anemia    Arthropathy    Benign neoplasm of breast    Breast cancer (HCC)    DCIS   Chest pain    CKD (chronic kidney disease), stage IV (HCC)    Diabetes (HCC)    Diabetic retinopathy (HCC)    Dyspnea    Edema    History of kidney stones    Hyperlipemia    Hypersomnia with sleep apnea    Hypertension    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HCC)    Lumbago    Neuropathy    Osteoarthrosis    Osteoporosis    Ovarian failure    Personal history of radiation therapy    Pneumonia due to COVID-19 virus 12/21/2021   a.) required inpatient admission   SOB (shortness of breath)     Current Outpatient Medications:    Aflibercept (EYLEA) 2 MG/0.05ML SOLN, , Disp: , Rfl:    Calcium-Magnesium-Vitamin D (CALCIUM 1200+D3 PO), Take 1 tablet by mouth daily., Disp: , Rfl:    Collagen-Boron-Hyaluronic Acid (MOVE FREE ULTRA JOINT HEALTH) 40-5-3.3 MG TABS, Take 1 tablet by mouth daily., Disp: , Rfl:    docusate sodium (COLACE) 100 MG capsule, Take 200 mg by mouth daily as needed., Disp: , Rfl:    doxycycline (MONODOX) 100 MG capsule, Take 1 capsule (100 mg total) by mouth 2 (two)  times daily. Take with food., Disp: 60 capsule, Rfl: 5   Dulaglutide (TRULICITY) 1.5 MG/0.5ML SOPN, Inject 1.5 mg into the skin once a week. (Patient taking differently: Inject 1.5 mg into the skin once a week. Monday's), Disp: 6 mL, Rfl: 4   Ferrous Gluconate 324 (37.5 Fe) MG TABS, Take 324 mg by mouth daily., Disp: , Rfl:    furosemide (LASIX) 20 MG tablet, Take 2 tablets by mouth in the AM and take 1 tablet by mouth in the PM. May add 1 tablet to afternoon dose if swelling not improved., Disp: 120 tablet, Rfl: 5   gentamicin cream (GARAMYCIN) 0.1 %, Apply to ulcers once daily., Disp: 30 g, Rfl: 1   glipiZIDE (GLUCOTROL XL) 2.5 MG 24 hr tablet, Take 1 tablet (2.5 mg total) by mouth daily with breakfast., Disp: 90 tablet, Rfl: 1   hydrocortisone 2.5 % cream, APPLY TO AFFECTED AREAS BUTTOCKS ONCE TO TWICE DAILY UNTIL IMPROVED., Disp: 28 g, Rfl: 0   insulin aspart (NOVOLOG FLEXPEN) 100 UNIT/ML FlexPen, INJECT [place] 3 times daily with meals AS DIRECTED PER SLIDING SCALE. MAX DAILY DOSE IS 30 UNITS. DX E11.65. (Patient taking differently: 4-14 Units 3 (three) times daily with meals. INJECT [place] 3 times  daily with meals AS DIRECTED PER SLIDING SCALE. MAX DAILY DOSE IS 30 UNITS. DX E11.65.), Disp: 15 mL, Rfl: 3   insulin aspart (NOVOLOG FLEXPEN) 100 UNIT/ML FlexPen, INJECT UNDER THE SKIN TWICE DAILY AS DIRECTED PER SLIDING SCALE. MAX DAILY DOSE IS 30 UNITS., Disp: 9 mL, Rfl: 2   insulin detemir (LEVEMIR FLEXPEN) 100 UNIT/ML FlexPen, INJECT UP TO 75 UNITS UNDER THE SKIN DAILY, TITRATE AS NEEDED. (Patient taking differently: 25 Units. INJECT UP TO 75 UNITS UNDER THE SKIN DAILY, TITRATE AS NEEDED.), Disp: 15 mL, Rfl: 2   ketoconazole (NIZORAL) 2 % cream, Apply to affected area buttocks twice daily until improved., Disp: 30 g, Rfl: 0   Krill Oil 500 MG CAPS, Take 1 capsule by mouth daily., Disp: , Rfl:    Lactobacillus (PROBIOTIC ACIDOPHILUS) CAPS, Take 1 capsule by mouth daily., Disp: , Rfl:    methocarbamol  (ROBAXIN) 750 MG tablet, Take 1 tablet (750 mg total) by mouth every 6 (six) hours as needed for muscle spasms., Disp: 120 tablet, Rfl: 2   Multiple Vitamin (MULTIVITAMIN) tablet, Take 1 tablet by mouth daily at 12 noon., Disp: , Rfl:    pantoprazole (PROTONIX) 40 MG tablet, TAKE 1 TABLET BY MOUTH EVERY DAY, Disp: 90 tablet, Rfl: 1   pregabalin (LYRICA) 100 MG capsule, Take 1 capsule (100 mg total) by mouth 2 (two) times daily., Disp: 180 capsule, Rfl: 0   rosuvastatin (CRESTOR) 20 MG tablet, TAKE 1 TABLET BY MOUTH EVERYDAY AT BEDTIME, Disp: 90 tablet, Rfl: 3   senna (SENOKOT) 8.6 MG TABS tablet, Take 1 tablet (8.6 mg total) by mouth 2 (two) times daily., Disp: 120 tablet, Rfl: 0   tamoxifen (NOLVADEX) 20 MG tablet, Take 1 tablet (20 mg total) by mouth daily., Disp: 90 tablet, Rfl: 3   traMADol-acetaminophen (ULTRACET) 37.5-325 MG tablet, Take 1 tablet by mouth every 6 (six) hours as needed for moderate pain or severe pain. This is a refill, Disp: 60 tablet, Rfl: 0   triamcinolone (KENALOG) 0.025 % cream, Apply 1 Application topically 2 (two) times daily as needed (itching). To groin area, Disp: 80 g, Rfl: 5  Social History   Tobacco Use  Smoking Status Former   Current packs/day: 0.00   Average packs/day: 1 pack/day for 30.0 years (30.0 ttl pk-yrs)   Types: Cigarettes   Start date: 04/10/1965   Quit date: 04/11/1995   Years since quitting: 27.8  Smokeless Tobacco Never    Allergies  Allergen Reactions   Amoxicillin Nausea And Vomiting   Objective:   Vitals:   02/11/23 1051  BP: (!) 194/85  Pulse: 80   There is no height or weight on file to calculate BMI. Constitutional Well developed. Well nourished.  Vascular Dorsalis pedis pulses palpable bilaterally. Posterior tibial pulses palpable bilaterally. Capillary refill normal to all digits.  No cyanosis or clubbing noted. Pedal hair growth normal.  Neurologic Normal speech. Oriented to person, place, and time. Epicritic  sensation to light touch grossly present bilaterally.  Dermatologic Right second digit hammertoe contracture/toe contracture with distal tip ulceration limited to breakdown of skin does not probe down to bone no malodor present no purulent drainage noted.  Orthopedic: Normal joint ROM without pain or crepitus bilaterally. No visible deformities. No bony tenderness.   Radiographs: None Assessment:   1. Contracture of toe of right foot   2. Skin ulcer of second toe of right foot, limited to breakdown of skin [facility])    Plan:  Patient was evaluated and treated  and all questions answered.  Right second digit ulceration with limited to the breakdown of the skin -All questions and concerns were discussed with the patient in extensive detail.  Given the amount of pain and discomfort she is having with toe patient will benefit from Betadine wet-to-dry dressing.  Also discussed offloading.  She is wearing surgical shoe.  Encouraged her to do Betadine dressing every day.If there is a regression patient will benefit from antibiotics and patient is a high risk of undergoing to amputation.  She states understanding.  No follow-ups on file.

## 2023-02-15 ENCOUNTER — Other Ambulatory Visit: Payer: Self-pay | Admitting: Nurse Practitioner

## 2023-02-15 ENCOUNTER — Other Ambulatory Visit: Payer: Self-pay | Admitting: Dermatology

## 2023-02-15 DIAGNOSIS — I700 Atherosclerosis of aorta: Secondary | ICD-10-CM

## 2023-02-23 ENCOUNTER — Emergency Department: Payer: Medicare HMO

## 2023-02-23 ENCOUNTER — Inpatient Hospital Stay
Admission: EM | Admit: 2023-02-23 | Discharge: 2023-02-28 | DRG: 854 | Disposition: A | Discharge status: Home Health | Payer: Medicare HMO | Attending: Student | Admitting: Student

## 2023-02-23 ENCOUNTER — Other Ambulatory Visit: Payer: Self-pay

## 2023-02-23 DIAGNOSIS — R000 Tachycardia, unspecified: Secondary | ICD-10-CM | POA: Diagnosis not present

## 2023-02-23 DIAGNOSIS — M85861 Other specified disorders of bone density and structure, right lower leg: Secondary | ICD-10-CM | POA: Diagnosis not present

## 2023-02-23 DIAGNOSIS — E1169 Type 2 diabetes mellitus with other specified complication: Secondary | ICD-10-CM | POA: Diagnosis present

## 2023-02-23 DIAGNOSIS — M8580 Other specified disorders of bone density and structure, unspecified site: Secondary | ICD-10-CM | POA: Diagnosis not present

## 2023-02-23 DIAGNOSIS — Z79899 Other long term (current) drug therapy: Secondary | ICD-10-CM

## 2023-02-23 DIAGNOSIS — W19XXXA Unspecified fall, initial encounter: Secondary | ICD-10-CM | POA: Diagnosis not present

## 2023-02-23 DIAGNOSIS — E11319 Type 2 diabetes mellitus with unspecified diabetic retinopathy without macular edema: Secondary | ICD-10-CM | POA: Diagnosis present

## 2023-02-23 DIAGNOSIS — Z89421 Acquired absence of other right toe(s): Secondary | ICD-10-CM | POA: Diagnosis not present

## 2023-02-23 DIAGNOSIS — Y92002 Bathroom of unspecified non-institutional (private) residence single-family (private) house as the place of occurrence of the external cause: Secondary | ICD-10-CM

## 2023-02-23 DIAGNOSIS — W1811XA Fall from or off toilet without subsequent striking against object, initial encounter: Secondary | ICD-10-CM | POA: Diagnosis present

## 2023-02-23 DIAGNOSIS — E785 Hyperlipidemia, unspecified: Secondary | ICD-10-CM | POA: Diagnosis present

## 2023-02-23 DIAGNOSIS — I10 Essential (primary) hypertension: Secondary | ICD-10-CM | POA: Diagnosis present

## 2023-02-23 DIAGNOSIS — N184 Chronic kidney disease, stage 4 (severe): Secondary | ICD-10-CM | POA: Diagnosis not present

## 2023-02-23 DIAGNOSIS — E1165 Type 2 diabetes mellitus with hyperglycemia: Secondary | ICD-10-CM | POA: Diagnosis present

## 2023-02-23 DIAGNOSIS — K529 Noninfective gastroenteritis and colitis, unspecified: Secondary | ICD-10-CM | POA: Diagnosis present

## 2023-02-23 DIAGNOSIS — R7881 Bacteremia: Secondary | ICD-10-CM | POA: Diagnosis not present

## 2023-02-23 DIAGNOSIS — E1122 Type 2 diabetes mellitus with diabetic chronic kidney disease: Secondary | ICD-10-CM | POA: Diagnosis present

## 2023-02-23 DIAGNOSIS — K760 Fatty (change of) liver, not elsewhere classified: Secondary | ICD-10-CM | POA: Diagnosis present

## 2023-02-23 DIAGNOSIS — R0902 Hypoxemia: Secondary | ICD-10-CM | POA: Diagnosis not present

## 2023-02-23 DIAGNOSIS — R6520 Severe sepsis without septic shock: Secondary | ICD-10-CM

## 2023-02-23 DIAGNOSIS — L97519 Non-pressure chronic ulcer of other part of right foot with unspecified severity: Secondary | ICD-10-CM | POA: Diagnosis not present

## 2023-02-23 DIAGNOSIS — R079 Chest pain, unspecified: Secondary | ICD-10-CM | POA: Diagnosis not present

## 2023-02-23 DIAGNOSIS — M2012 Hallux valgus (acquired), left foot: Secondary | ICD-10-CM | POA: Diagnosis not present

## 2023-02-23 DIAGNOSIS — Z7984 Long term (current) use of oral hypoglycemic drugs: Secondary | ICD-10-CM

## 2023-02-23 DIAGNOSIS — Z86000 Personal history of in-situ neoplasm of breast: Secondary | ICD-10-CM

## 2023-02-23 DIAGNOSIS — D509 Iron deficiency anemia, unspecified: Secondary | ICD-10-CM | POA: Diagnosis present

## 2023-02-23 DIAGNOSIS — I2510 Atherosclerotic heart disease of native coronary artery without angina pectoris: Secondary | ICD-10-CM | POA: Diagnosis present

## 2023-02-23 DIAGNOSIS — E66812 Obesity, class 2: Secondary | ICD-10-CM | POA: Diagnosis present

## 2023-02-23 DIAGNOSIS — K632 Fistula of intestine: Secondary | ICD-10-CM | POA: Diagnosis not present

## 2023-02-23 DIAGNOSIS — M86671 Other chronic osteomyelitis, right ankle and foot: Secondary | ICD-10-CM | POA: Diagnosis not present

## 2023-02-23 DIAGNOSIS — I700 Atherosclerosis of aorta: Secondary | ICD-10-CM | POA: Diagnosis not present

## 2023-02-23 DIAGNOSIS — G8929 Other chronic pain: Secondary | ICD-10-CM | POA: Diagnosis not present

## 2023-02-23 DIAGNOSIS — M869 Osteomyelitis, unspecified: Secondary | ICD-10-CM | POA: Diagnosis not present

## 2023-02-23 DIAGNOSIS — R600 Localized edema: Secondary | ICD-10-CM | POA: Diagnosis not present

## 2023-02-23 DIAGNOSIS — E1140 Type 2 diabetes mellitus with diabetic neuropathy, unspecified: Secondary | ICD-10-CM | POA: Diagnosis present

## 2023-02-23 DIAGNOSIS — A419 Sepsis, unspecified organism: Principal | ICD-10-CM | POA: Diagnosis present

## 2023-02-23 DIAGNOSIS — M810 Age-related osteoporosis without current pathological fracture: Secondary | ICD-10-CM | POA: Diagnosis present

## 2023-02-23 DIAGNOSIS — Z79810 Long term (current) use of selective estrogen receptor modulators (SERMs): Secondary | ICD-10-CM

## 2023-02-23 DIAGNOSIS — Z66 Do not resuscitate: Secondary | ICD-10-CM | POA: Diagnosis not present

## 2023-02-23 DIAGNOSIS — I5032 Chronic diastolic (congestive) heart failure: Secondary | ICD-10-CM | POA: Diagnosis present

## 2023-02-23 DIAGNOSIS — E876 Hypokalemia: Secondary | ICD-10-CM | POA: Diagnosis present

## 2023-02-23 DIAGNOSIS — M549 Dorsalgia, unspecified: Secondary | ICD-10-CM | POA: Diagnosis not present

## 2023-02-23 DIAGNOSIS — Z1152 Encounter for screening for COVID-19: Secondary | ICD-10-CM | POA: Diagnosis not present

## 2023-02-23 DIAGNOSIS — Y92009 Unspecified place in unspecified non-institutional (private) residence as the place of occurrence of the external cause: Secondary | ICD-10-CM

## 2023-02-23 DIAGNOSIS — D696 Thrombocytopenia, unspecified: Secondary | ICD-10-CM | POA: Diagnosis present

## 2023-02-23 DIAGNOSIS — M19071 Primary osteoarthritis, right ankle and foot: Secondary | ICD-10-CM | POA: Diagnosis not present

## 2023-02-23 DIAGNOSIS — Z043 Encounter for examination and observation following other accident: Secondary | ICD-10-CM | POA: Diagnosis not present

## 2023-02-23 DIAGNOSIS — Z8616 Personal history of COVID-19: Secondary | ICD-10-CM | POA: Diagnosis not present

## 2023-02-23 DIAGNOSIS — A401 Sepsis due to streptococcus, group B: Principal | ICD-10-CM | POA: Diagnosis present

## 2023-02-23 DIAGNOSIS — M86171 Other acute osteomyelitis, right ankle and foot: Secondary | ICD-10-CM

## 2023-02-23 DIAGNOSIS — K5732 Diverticulitis of large intestine without perforation or abscess without bleeding: Secondary | ICD-10-CM

## 2023-02-23 DIAGNOSIS — E11621 Type 2 diabetes mellitus with foot ulcer: Secondary | ICD-10-CM | POA: Diagnosis present

## 2023-02-23 DIAGNOSIS — M2011 Hallux valgus (acquired), right foot: Secondary | ICD-10-CM | POA: Diagnosis not present

## 2023-02-23 DIAGNOSIS — Z923 Personal history of irradiation: Secondary | ICD-10-CM

## 2023-02-23 DIAGNOSIS — G250 Essential tremor: Secondary | ICD-10-CM | POA: Diagnosis present

## 2023-02-23 DIAGNOSIS — B951 Streptococcus, group B, as the cause of diseases classified elsewhere: Secondary | ICD-10-CM | POA: Diagnosis not present

## 2023-02-23 DIAGNOSIS — E1129 Type 2 diabetes mellitus with other diabetic kidney complication: Secondary | ICD-10-CM | POA: Diagnosis present

## 2023-02-23 DIAGNOSIS — Z6837 Body mass index (BMI) 37.0-37.9, adult: Secondary | ICD-10-CM

## 2023-02-23 DIAGNOSIS — E11628 Type 2 diabetes mellitus with other skin complications: Secondary | ICD-10-CM | POA: Diagnosis not present

## 2023-02-23 DIAGNOSIS — Z801 Family history of malignant neoplasm of trachea, bronchus and lung: Secondary | ICD-10-CM

## 2023-02-23 DIAGNOSIS — M25551 Pain in right hip: Secondary | ICD-10-CM | POA: Diagnosis not present

## 2023-02-23 DIAGNOSIS — S80919A Unspecified superficial injury of unspecified knee, initial encounter: Secondary | ICD-10-CM | POA: Diagnosis not present

## 2023-02-23 DIAGNOSIS — M7989 Other specified soft tissue disorders: Secondary | ICD-10-CM | POA: Diagnosis not present

## 2023-02-23 DIAGNOSIS — Z7985 Long-term (current) use of injectable non-insulin antidiabetic drugs: Secondary | ICD-10-CM

## 2023-02-23 DIAGNOSIS — E860 Dehydration: Secondary | ICD-10-CM | POA: Diagnosis present

## 2023-02-23 DIAGNOSIS — I130 Hypertensive heart and chronic kidney disease with heart failure and stage 1 through stage 4 chronic kidney disease, or unspecified chronic kidney disease: Secondary | ICD-10-CM | POA: Diagnosis present

## 2023-02-23 DIAGNOSIS — Z87891 Personal history of nicotine dependence: Secondary | ICD-10-CM

## 2023-02-23 DIAGNOSIS — S90122A Contusion of left lesser toe(s) without damage to nail, initial encounter: Secondary | ICD-10-CM

## 2023-02-23 DIAGNOSIS — M5442 Lumbago with sciatica, left side: Secondary | ICD-10-CM | POA: Diagnosis present

## 2023-02-23 DIAGNOSIS — Z794 Long term (current) use of insulin: Secondary | ICD-10-CM | POA: Diagnosis not present

## 2023-02-23 DIAGNOSIS — M25561 Pain in right knee: Secondary | ICD-10-CM | POA: Diagnosis not present

## 2023-02-23 DIAGNOSIS — L089 Local infection of the skin and subcutaneous tissue, unspecified: Secondary | ICD-10-CM | POA: Diagnosis not present

## 2023-02-23 DIAGNOSIS — Z853 Personal history of malignant neoplasm of breast: Secondary | ICD-10-CM

## 2023-02-23 DIAGNOSIS — Z803 Family history of malignant neoplasm of breast: Secondary | ICD-10-CM

## 2023-02-23 DIAGNOSIS — Z800 Family history of malignant neoplasm of digestive organs: Secondary | ICD-10-CM

## 2023-02-23 DIAGNOSIS — M1711 Unilateral primary osteoarthritis, right knee: Secondary | ICD-10-CM | POA: Diagnosis not present

## 2023-02-23 DIAGNOSIS — K5792 Diverticulitis of intestine, part unspecified, without perforation or abscess without bleeding: Secondary | ICD-10-CM | POA: Diagnosis not present

## 2023-02-23 DIAGNOSIS — I959 Hypotension, unspecified: Secondary | ICD-10-CM | POA: Diagnosis not present

## 2023-02-23 DIAGNOSIS — A408 Other streptococcal sepsis: Secondary | ICD-10-CM | POA: Diagnosis not present

## 2023-02-23 LAB — CBC WITH DIFFERENTIAL/PLATELET
Abs Immature Granulocytes: 0.05 10*3/uL (ref 0.00–0.07)
Basophils Absolute: 0 10*3/uL (ref 0.0–0.1)
Basophils Relative: 0 %
Eosinophils Absolute: 0 10*3/uL (ref 0.0–0.5)
Eosinophils Relative: 0 %
HCT: 33.3 % — ABNORMAL LOW (ref 36.0–46.0)
Hemoglobin: 10.5 g/dL — ABNORMAL LOW (ref 12.0–15.0)
Immature Granulocytes: 1 %
Lymphocytes Relative: 4 %
Lymphs Abs: 0.4 10*3/uL — ABNORMAL LOW (ref 0.7–4.0)
MCH: 27.4 pg (ref 26.0–34.0)
MCHC: 31.5 g/dL (ref 30.0–36.0)
MCV: 86.9 fL (ref 80.0–100.0)
Monocytes Absolute: 0.3 10*3/uL (ref 0.1–1.0)
Monocytes Relative: 3 %
Neutro Abs: 9.8 10*3/uL — ABNORMAL HIGH (ref 1.7–7.7)
Neutrophils Relative %: 92 %
Platelets: 134 10*3/uL — ABNORMAL LOW (ref 150–400)
RBC: 3.83 MIL/uL — ABNORMAL LOW (ref 3.87–5.11)
RDW: 17.4 % — ABNORMAL HIGH (ref 11.5–15.5)
WBC: 10.6 10*3/uL — ABNORMAL HIGH (ref 4.0–10.5)
nRBC: 0 % (ref 0.0–0.2)

## 2023-02-23 LAB — URINALYSIS, ROUTINE W REFLEX MICROSCOPIC
Bilirubin Urine: NEGATIVE
Glucose, UA: NEGATIVE mg/dL
Hgb urine dipstick: NEGATIVE
Ketones, ur: NEGATIVE mg/dL
Leukocytes,Ua: NEGATIVE
Nitrite: NEGATIVE
Protein, ur: NEGATIVE mg/dL
Specific Gravity, Urine: 1.01 (ref 1.005–1.030)
pH: 6 (ref 5.0–8.0)

## 2023-02-23 LAB — COMPREHENSIVE METABOLIC PANEL
ALT: 19 U/L (ref 0–44)
AST: 26 U/L (ref 15–41)
Albumin: 3.2 g/dL — ABNORMAL LOW (ref 3.5–5.0)
Alkaline Phosphatase: 81 U/L (ref 38–126)
Anion gap: 12 (ref 5–15)
BUN: 37 mg/dL — ABNORMAL HIGH (ref 8–23)
CO2: 26 mmol/L (ref 22–32)
Calcium: 8.7 mg/dL — ABNORMAL LOW (ref 8.9–10.3)
Chloride: 96 mmol/L — ABNORMAL LOW (ref 98–111)
Creatinine, Ser: 1.91 mg/dL — ABNORMAL HIGH (ref 0.44–1.00)
GFR, Estimated: 27 mL/min — ABNORMAL LOW (ref >60)
Glucose, Bld: 240 mg/dL — ABNORMAL HIGH (ref 70–99)
Potassium: 3.9 mmol/L (ref 3.5–5.1)
Sodium: 134 mmol/L — ABNORMAL LOW (ref 135–145)
Total Bilirubin: 1.3 mg/dL — ABNORMAL HIGH (ref 0.3–1.2)
Total Protein: 7.8 g/dL (ref 6.5–8.1)

## 2023-02-23 LAB — PROCALCITONIN: Procalcitonin: 0.79 ng/mL

## 2023-02-23 LAB — SARS CORONAVIRUS 2 BY RT PCR: SARS Coronavirus 2 by RT PCR: NEGATIVE

## 2023-02-23 LAB — BRAIN NATRIURETIC PEPTIDE: B Natriuretic Peptide: 63 pg/mL (ref 0.0–100.0)

## 2023-02-23 LAB — PROTIME-INR
INR: 1.2 (ref 0.8–1.2)
Prothrombin Time: 15.6 s — ABNORMAL HIGH (ref 11.4–15.2)

## 2023-02-23 LAB — CBG MONITORING, ED: Glucose-Capillary: 176 mg/dL — ABNORMAL HIGH (ref 70–99)

## 2023-02-23 LAB — LACTIC ACID, PLASMA: Lactic Acid, Venous: 1.9 mmol/L (ref 0.5–1.9)

## 2023-02-23 LAB — APTT: aPTT: 45 s — ABNORMAL HIGH (ref 24–36)

## 2023-02-23 MED ORDER — ACETAMINOPHEN 10 MG/ML IV SOLN
1000.0000 mg | Freq: Once | INTRAVENOUS | Status: AC
  Administered 2023-02-23: 1000 mg via INTRAVENOUS
  Filled 2023-02-23 (×2): qty 100

## 2023-02-23 MED ORDER — SODIUM CHLORIDE 0.9 % IV BOLUS
500.0000 mL | Freq: Once | INTRAVENOUS | Status: AC
  Administered 2023-02-23: 500 mL via INTRAVENOUS

## 2023-02-23 MED ORDER — SODIUM CHLORIDE 0.9 % IV BOLUS (SEPSIS)
1000.0000 mL | Freq: Once | INTRAVENOUS | Status: AC
  Administered 2023-02-23: 1000 mL via INTRAVENOUS

## 2023-02-23 MED ORDER — SODIUM CHLORIDE 0.9 % IV SOLN: INTRAVENOUS | Status: DC

## 2023-02-23 MED ORDER — HYDRALAZINE HCL 20 MG/ML IJ SOLN: 5.0000 mg | INTRAMUSCULAR | Status: DC | PRN

## 2023-02-23 MED ORDER — ACETAMINOPHEN 500 MG PO TABS
1000.0000 mg | ORAL_TABLET | Freq: Once | ORAL | Status: DC
  Filled 2023-02-23: qty 2

## 2023-02-23 MED ORDER — HEPARIN SODIUM (PORCINE) 5000 UNIT/ML IJ SOLN
5000.0000 [IU] | Freq: Three times a day (TID) | INTRAMUSCULAR | Status: DC
  Administered 2023-02-23 – 2023-02-28 (×14): 5000 [IU] via SUBCUTANEOUS
  Filled 2023-02-23 (×14): qty 1

## 2023-02-23 MED ORDER — MORPHINE SULFATE (PF) 2 MG/ML IV SOLN
1.0000 mg | INTRAVENOUS | Status: DC | PRN
  Administered 2023-02-24 – 2023-02-26 (×3): 1 mg via INTRAVENOUS
  Filled 2023-02-23 (×3): qty 1

## 2023-02-23 MED ORDER — INSULIN ASPART 100 UNIT/ML IJ SOLN
0.0000 [IU] | Freq: Three times a day (TID) | INTRAMUSCULAR | Status: DC
  Administered 2023-02-24: 3 [IU] via SUBCUTANEOUS
  Administered 2023-02-24 (×2): 2 [IU] via SUBCUTANEOUS
  Administered 2023-02-25 – 2023-02-27 (×2): 1 [IU] via SUBCUTANEOUS
  Administered 2023-02-27: 2 [IU] via SUBCUTANEOUS
  Filled 2023-02-23 (×7): qty 1

## 2023-02-23 MED ORDER — TRAMADOL-ACETAMINOPHEN 37.5-325 MG PO TABS
1.0000 | ORAL_TABLET | Freq: Four times a day (QID) | ORAL | Status: DC | PRN
  Administered 2023-02-27: 1 via ORAL
  Filled 2023-02-23 (×2): qty 1

## 2023-02-23 MED ORDER — ONDANSETRON HCL 4 MG/2ML IJ SOLN: 4.0000 mg | Freq: Three times a day (TID) | INTRAMUSCULAR | Status: DC | PRN

## 2023-02-23 MED ORDER — ACETAMINOPHEN 325 MG PO TABS
650.0000 mg | ORAL_TABLET | Freq: Four times a day (QID) | ORAL | Status: DC | PRN
  Administered 2023-02-23 – 2023-02-24 (×3): 650 mg via ORAL
  Filled 2023-02-23 (×3): qty 2

## 2023-02-23 MED ORDER — INSULIN ASPART 100 UNIT/ML IJ SOLN: 0.0000 [IU] | Freq: Every day | INTRAMUSCULAR | Status: DC

## 2023-02-23 MED ORDER — METRONIDAZOLE 500 MG/100ML IV SOLN
500.0000 mg | Freq: Once | INTRAVENOUS | Status: AC
  Administered 2023-02-23: 500 mg via INTRAVENOUS
  Filled 2023-02-23: qty 100

## 2023-02-23 MED ORDER — SODIUM CHLORIDE 0.9 % IV SOLN
2.0000 g | INTRAVENOUS | Status: DC
  Administered 2023-02-23: 2 g via INTRAVENOUS
  Filled 2023-02-23: qty 20

## 2023-02-23 MED ORDER — SODIUM CHLORIDE 0.9 % IV BOLUS: 1000.0000 mL | Freq: Once | INTRAVENOUS | Status: DC

## 2023-02-23 MED ORDER — PIPERACILLIN-TAZOBACTAM 3.375 G IVPB
3.3750 g | Freq: Three times a day (TID) | INTRAVENOUS | Status: DC
  Administered 2023-02-23 – 2023-02-27 (×11): 3.375 g via INTRAVENOUS
  Filled 2023-02-23 (×11): qty 50

## 2023-02-23 NOTE — ED Notes (Signed)
Pt taken to room. Pt states she doesn't want to get out of the wheelchair and get into the bed. Pt states she prefers to stay in chair. MD and [facility] informed that pt is still in chair for her preference.

## 2023-02-23 NOTE — ED Triage Notes (Addendum)
Pt comes via EMs from home with c/o fall. Pt fell yesterday off the toilet. Pt today she slid out of chair. No loc or hitting head. Pt states back pain and right knee pain.  Pt denies any dizziness, sob or chest pain.

## 2023-02-23 NOTE — ED Provider Notes (Signed)
[facility] Provider Note    Event Date/Time   First MD Initiated Contact with Patient 02/23/23 1600     (approximate)   History   Chief Complaint: Fall   HPI  Kara [name] is a 77 y.o. female with a history of diabetes, hyperlipidemia, CKD, hypertension, CHF who was brought to the ED due to generalized weakness, worsening over the past 2 days.  Had 2 falls from low height, off the toilet and out of a chair.  Denies loss of consciousness or hitting her head.  She does complain of pain in the low back and right hip and right knee.  Denies chest pain shortness of breath cough or abdominal pain.  Has loss of appetite.  Per husband, patient is confused and very low energy.     Physical Exam   Triage Vital Signs: ED Triage Vitals [02/23/23 1455]  Encounter Vitals Group     BP (!) 162/91     Systolic BP Percentile      Diastolic BP Percentile      Pulse Rate 89     Resp 19     Temp 98.3 F (36.8 C)     Temp Source Oral     SpO2 95 %     Weight      Height      Head Circumference      Peak Flow      Pain Score 6     Pain Loc      Pain Education      Exclude from Growth Chart     Most recent vital signs: Vitals:   02/23/23 1900 02/23/23 1915  BP: 111/89   Pulse: 95   Resp: (!) 23   Temp:  100.2 F (37.9 C)  SpO2: 95%     General: Awake, no distress.  Ill-appearing CV:  Good peripheral perfusion.  Tachycardia heart rate 130 Resp:  Tachypnea, respiratory rate of 25.  Scattered bilateral crackles Abd:  No distention.  Mild right upper quadrant tenderness suprapubic tenderness. Other:  Dry oral mucosa.  No lower extremity edema.  There is tenderness at the right hip and right knee without deformity.  No wounds, no inflammatory soft tissue changes.   ED Results / Procedures / Treatments   Labs (all labs ordered are listed, but only abnormal results are displayed) Labs Reviewed  COMPREHENSIVE METABOLIC PANEL - Abnormal; Notable for the  following components:      Result Value   Sodium 134 (*)    Chloride 96 (*)    Glucose, Bld 240 (*)    BUN 37 (*)    Creatinine, Ser 1.91 (*)    Calcium 8.7 (*)    Albumin 3.2 (*)    Total Bilirubin 1.3 (*)    GFR, Estimated 27 (*)    All other components within normal limits  CBC WITH DIFFERENTIAL/PLATELET - Abnormal; Notable for the following components:   WBC 10.6 (*)    RBC 3.83 (*)    Hemoglobin 10.5 (*)    HCT 33.3 (*)    RDW 17.4 (*)    Platelets 134 (*)    Neutro Abs 9.8 (*)    Lymphs Abs 0.4 (*)    All other components within normal limits  URINALYSIS, ROUTINE W REFLEX MICROSCOPIC - Abnormal; Notable for the following components:   Color, Urine YELLOW (*)    APPearance CLEAR (*)    All other components within normal limits  SARS CORONAVIRUS 2  BY RT PCR  URINE CULTURE  CULTURE, BLOOD (ROUTINE X 2)  CULTURE, BLOOD (ROUTINE X 2)  LACTIC ACID, PLASMA  BRAIN NATRIURETIC PEPTIDE  PROCALCITONIN  PROTIME-INR  APTT     EKG Interpreted by me Sinus tachycardia rate 130.  Normal axis, normal intervals.  Poor R wave progression.  Normal ST segments and T waves.   RADIOLOGY Chest x-ray interpreted by me, unremarkable.  Radiology report reviewed  CT abdomen pelvis shows extensive sigmoid diverticulosis without frank diverticulitis.  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   PROCEDURES:  .Critical Care  Performed by: Sharman Cheek, MD Authorized by: Sharman Cheek, MD   Critical care provider statement:    Critical care time (minutes):  35   Critical care time was exclusive of:  Separately billable procedures and treating other patients   Critical care was necessary to treat or prevent imminent or life-threatening deterioration of the following conditions:  Sepsis and CNS failure or compromise   Critical care was time spent personally by me on the following activities:  Development of treatment plan with patient or surrogate, discussions with consultants, evaluation  of patient's response to treatment, examination of patient, obtaining history from patient or surrogate, ordering and performing treatments and interventions, ordering and review of laboratory studies, ordering and review of radiographic studies, pulse oximetry, re-evaluation of patient's condition and review of old charts   Care discussed with: admitting provider      MEDICATIONS ORDERED IN ED: Medications  acetaminophen (TYLENOL) tablet 1,000 mg (1,000 mg Oral Not Given 02/23/23 1624)  cefTRIAXone (ROCEPHIN) 2 g in sodium chloride 0.9 % 100 mL IVPB (0 g Intravenous Stopped 02/23/23 1700)  ondansetron (ZOFRAN) injection 4 mg (has no administration in time range)  hydrALAZINE (APRESOLINE) injection 5 mg (has no administration in time range)  acetaminophen (TYLENOL) tablet 650 mg (has no administration in time range)  0.9 %  sodium chloride infusion (has no administration in time range)  insulin aspart (novoLOG) injection 0-9 Units (has no administration in time range)  insulin aspart (novoLOG) injection 0-5 Units (has no administration in time range)  metroNIDAZOLE (FLAGYL) IVPB 500 mg (has no administration in time range)  acetaminophen (OFIRMEV) IV 1,000 mg (0 mg Intravenous Stopped 02/23/23 1645)  sodium chloride 0.9 % bolus 1,000 mL (0 mLs Intravenous Stopped 02/23/23 1953)     IMPRESSION / MDM / ASSESSMENT AND PLAN / ED COURSE  I reviewed the triage vital signs and the nursing notes.  DDx: Intracranial hemorrhage, pelvis fracture, hip fracture, knee fracture, pneumonia, UTI, AKI, electrolyte abnormality, anemia, sepsis, cholecystitis, pancreatitis  Patient's presentation is most consistent with acute presentation with potential threat to life or bodily function.  Patient presents with fever tachycardia generalized weakness, decreased level of consciousness.  Differential is broad.  Will obtain labs, x-rays and CT head for trauma evaluation, likely follow-up CT chest abdomen pelvis for  occult sources of infection given patient's limited ability to participate in history and physical exam.   ----------------------------------------- 8:26 PM on 02/23/2023 ----------------------------------------- Mental status improving, patient is alert, interactive.  Workup reveals AKI, along with overall reassuring labs and imaging.  I suspect with her ongoing lower abdominal tenderness that she is developing diverticulitis or possibly abscess of the colocolonic fistula reported on CT.  Case discussed with hospitalist for further evaluation.      FINAL CLINICAL IMPRESSION(S) / ED DIAGNOSES   Final diagnoses:  Sepsis without acute organ dysfunction, due to unspecified organism [facility])  Diverticulitis of large intestine without bleeding, unspecified  complication status     Rx / DC Orders   ED Discharge Orders     None        Note:  This document was prepared using Dragon voice recognition software and may include unintentional dictation errors.   Sharman Cheek, MD 02/23/23 2027

## 2023-02-23 NOTE — ED Notes (Signed)
Passed catheter into urethra, only 1 drop of urine. Doing bladder scan.

## 2023-02-23 NOTE — H&P (Incomplete)
History and Physical    AMAI CAPPIELLO OAC:[id number] DOB: 1945-09-11 DOA: 02/23/2023  Referring MD/NP/PA:   PCP: [name], NP   Patient coming from:  The patient is coming from home.     Chief Complaint: abdominal pain and fever, fall  HPI: Kara [name] is a 77 y.o. female with medical history significant of HTN, HDL, DM, diabetic neuropathy, dCHF, CKD-4, anemia, kidney stone, chronic back pain, right breast cancer-DCIS, essential tremor, chronic diabetic foot ulcer (right second toe) who presents with abdominal pain, fever, fall.  Patient states that she has a remote history of abdominal surgery due to abdominal abscess, but does not remember what caused her abdominal abscess.  She has chronic lower abdominal pain for several months, which has been intermittent, sometimes becomes severe recently.  Currently her lower abdominal pain is mild, aching, nonradiating.  Not associated with nausea, vomiting or diarrhea.  Patient has fever and chills.  Patient denies chest pain, cough, SOB.  No symptoms of UTI. Pt has a chronic right second toe ulcer and following up with Dr. Allena Katz of podiatry.  Patient has generalized weakness.  She states that she has had 2 falls from low height. Pt fell off the toilet last night and slid out of a chair today.  Denies loss of consciousness or hitting her head.  She has pain in the low back and right hip and right knee.   Data reviewed independently and ED Course: pt was found to have WBC 10.6, lactic acid 1.9, negative urinalysis, negative COVID PCR, worsening renal function with creatinine 1.91, BUN 37, GFR 27 (recent baseline creatinine 1.35), temperature 103.2, blood pressure 111/89, heart rate 138 --> 95, RR 36 --> 23, oxygen sat 95% on room air.  Chest x-ray negative.  CT head negative.  X-ray of right and lower back and right hip/pelvis negative for acute injury. Patient is admitted to telemetry bed as inpatient.  CT scan of chest/abdomen/pelvis: 1.  No acute intrathoracic or intra-abdominal pathology identified. 2. Mild coronary artery calcification. 3. Morphologic changes in keeping with pulmonary arterial hypertension. 4. Mild hepatomegaly and hepatic steatosis. 5. Severe sigmoid diverticulosis. 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 An infiltrative malignancy is less likely, but not completely excluded. Correlation with colonoscopy may be helpful for further evaluation.   Aortic Atherosclerosis (ICD10-I70.0).    EKG: I have personally reviewed.  Sinus rhythm, QTc 418, low voltage, poor R wave progression, borderline right axis deviation.   Review of Systems:   General: has fevers, chills, no body weight gain, has poor appetite, has fatigue HEENT: no blurry vision, hearing changes or sore throat Respiratory: no dyspnea, coughing, wheezing CV: no chest pain, no palpitations GI: no nausea, vomiting, has lower abdominal pain, no  diarrhea, constipation GU: no dysuria, burning on urination, increased urinary frequency, hematuria  Ext: has trace leg edema Neuro: no unilateral weakness, numbness, or tingling, no vision change or hearing loss Skin: has right second toe ulcer MSK: has pain in right knee and lower back Heme: No easy bruising.  Travel history: No recent long distant travel.   Allergy:  Allergies  Allergen Reactions   Amoxicillin Nausea And Vomiting    Past Medical History:  Diagnosis Date   Anemia    Arthropathy  Benign neoplasm of breast    Breast cancer (HCC)    DCIS   Chest pain    CKD (chronic kidney disease), stage IV (HCC)    Diabetes (HCC)    Diabetic retinopathy (HCC)    Dyspnea    Edema    History of  kidney stones    Hyperlipemia    Hypersomnia with sleep apnea    Hypertension    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HCC)    Lumbago    Neuropathy    Osteoarthrosis    Osteoporosis    Ovarian failure    Personal history of radiation therapy    Pneumonia due to COVID-19 virus 12/21/2021   a.) required inpatient admission   SOB (shortness of breath)     Past Surgical History:  Procedure Laterality Date   ANTERIOR LATERAL LUMBAR FUSION WITH PERCUTANEOUS SCREW 2 LEVEL N/A 07/03/2022   Procedure: L3-5 LATERAL LUMBAR INTERBODY FUSION WITH POSTERIOR SPINAL FUSION;  Surgeon: Venetia Night, MD;  Location: [facility];  Service: Neurosurgery;  Laterality: N/A;   APPLICATION OF INTRAOPERATIVE CT SCAN N/A 07/03/2022   Procedure: APPLICATION OF INTRAOPERATIVE CT SCAN;  Surgeon: Venetia Night, MD;  Location: [facility];  Service: Neurosurgery;  Laterality: N/A;   BILATERAL SALPINGECTOMY     BREAST BIOPSY Right 2014   neg- core   BREAST BIOPSY Right 12/21/2018   [place] bx, venus clip,  DUCTAL CARCINOMA IN SITU   BREAST BIOPSY Right 12/21/2018   [place] bx, ribbon clip,  FIBROEPITHELIAL PROLIFERATION WITH SCLEROSIS   BREAST BIOPSY Right 01/01/2019   Affirm bx "X" clip-path pending   BREAST DUCTAL SYSTEM EXCISION     BREAST EXCISIONAL BIOPSY Right 2014   neg   BREAST LUMPECTOMY Right 01/22/2019   path pending   CATARACT EXTRACTION     CHOLECYSTECTOMY     CYSTOSCOPY WITH STENT PLACEMENT Left 04/14/2015   Procedure: CYSTOSCOPY WITH STENT PLACEMENT;  Surgeon: Vanna Scotland, MD;  Location: [facility];  Service: Urology;  Laterality: Left;   EXTRACORPOREAL SHOCK WAVE LITHOTRIPSY Left 09/29/2014   Procedure: EXTRACORPOREAL SHOCK WAVE LITHOTRIPSY (ESWL);  Surgeon: Vanna Scotland, MD;  Location: [facility];  Service: Urology;  Laterality: Left;   EYE SURGERY     bilateral cataract   IRRIGATION AND DEBRIDEMENT ABSCESS Right 03/24/2019   Procedure: IRRIGATION AND DEBRIDEMENT ABSCESS RIGHT BREAST;  Surgeon:  [name], MD;  Location: [facility];  Service: General;  Laterality: Right;   LAPAROSCOPIC OOPHERECTOMY Left    PARTIAL MASTECTOMY WITH NEEDLE LOCALIZATION Right 01/22/2019   Procedure: PARTIAL MASTECTOMY WITH NEEDLE LOCALIZATION;  Surgeon: [name], MD;  Location: [facility];  Service: General;  Laterality: Right;   TONSILLECTOMY     TUBAL LIGATION     URETEROSCOPY WITH HOLMIUM LASER LITHOTRIPSY Left 04/14/2015   Procedure: URETEROSCOPY WITH HOLMIUM LASER LITHOTRIPSY;  Surgeon: Vanna Scotland, MD;  Location: [facility];  Service: Urology;  Laterality: Left;    Social History:  reports that she quit smoking about 27 years ago. Her smoking use included cigarettes. She started smoking about 57 years ago. She has a 30 pack-year smoking history. She has never used smokeless tobacco. She reports that she does not drink alcohol and does not use drugs.  Family History:  Family History  Problem Relation Age of Onset   Breast cancer Maternal Grandmother 15   Colon cancer Mother    Lung cancer Father      Prior to Admission medications   Medication Sig Start Date End Date Taking? Authorizing  Provider  Aflibercept Gretta Cool) 2 MG/0.05ML SOLN     [provider]  Calcium-Magnesium-Vitamin D (CALCIUM 1200+D3 PO) Take 1 tablet by mouth daily.    [provider]  Collagen-Boron-Hyaluronic Acid (MOVE FREE ULTRA JOINT HEALTH) 40-5-3.3 MG TABS Take 1 tablet by mouth daily.    [provider]  docusate sodium (COLACE) 100 MG capsule Take 200 mg by mouth daily as needed.    [provider]  doxycycline (MONODOX) 100 MG capsule Take 1 capsule (100 mg total) by mouth 2 (two) times daily. Take with food. 11/06/22   Willeen Niece, MD  Dulaglutide (TRULICITY) 1.5 MG/0.5ML SOPN Inject 1.5 mg into the skin once a week. Patient taking differently: Inject 1.5 mg into the skin once a week. Monday's 11/10/20   Lyndon Code, MD  Ferrous Gluconate 324 (37.5 Fe) MG TABS  Take 324 mg by mouth daily.    [provider]  furosemide (LASIX) 20 MG tablet Take 2 tablets by mouth in the AM and take 1 tablet by mouth in the PM. May add 1 tablet to afternoon dose if swelling not improved. 11/26/22   [name], NP  gentamicin cream (GARAMYCIN) 0.1 % Apply to ulcers once daily. 12/09/22   Freddie Breech, DPM  glipiZIDE (GLUCOTROL XL) 2.5 MG 24 hr tablet Take 1 tablet (2.5 mg total) by mouth daily with breakfast. 12/19/22   [name], NP  hydrocortisone 2.5 % cream APPLY TO AFFECTED AREAS BUTTOCKS ONCE TO TWICE DAILY UNTIL IMPROVED. 02/17/23   Willeen Niece, MD  insulin aspart (NOVOLOG FLEXPEN) 100 UNIT/ML FlexPen INJECT UNDER THE SKIN TWICE DAILY AS DIRECTED PER SLIDING SCALE. MAX DAILY DOSE IS 30 UNITS. 01/10/22   Lyndon Code, MD  insulin aspart (NOVOLOG FLEXPEN) 100 UNIT/ML FlexPen INJECT [place] 3 times daily with meals AS DIRECTED PER SLIDING SCALE. MAX DAILY DOSE IS 30 UNITS. DX E11.65. 02/18/23   Abernathy, Arlyss Repress, NP  insulin detemir (LEVEMIR FLEXPEN) 100 UNIT/ML FlexPen INJECT UP TO 75 UNITS UNDER THE SKIN DAILY, TITRATE AS NEEDED. Patient taking differently: 25 Units. INJECT UP TO 75 UNITS UNDER THE SKIN DAILY, TITRATE AS NEEDED. 10/15/21   [name], NP  ketoconazole (NIZORAL) 2 % cream Apply to affected area buttocks twice daily until improved. 11/06/22   Willeen Niece, MD  Boris Lown Oil 500 MG CAPS Take 1 capsule by mouth daily.    [provider]  Lactobacillus (PROBIOTIC ACIDOPHILUS) CAPS Take 1 capsule by mouth daily.    [provider]  methocarbamol (ROBAXIN) 750 MG tablet Take 1 tablet (750 mg total) by mouth every 6 (six) hours as needed for muscle spasms. 06/05/22   [name], NP  Multiple Vitamin (MULTIVITAMIN) tablet Take 1 tablet by mouth daily at 12 noon.    [provider]  pantoprazole (PROTONIX) 40 MG tablet TAKE 1 TABLET BY MOUTH EVERY DAY 11/15/22   [name], NP  pregabalin (LYRICA)  100 MG capsule Take 1 capsule (100 mg total) by mouth 2 (two) times daily. 12/19/22   [name], NP  rosuvastatin (CRESTOR) 20 MG tablet TAKE 1 TABLET BY MOUTH EVERYDAY AT BEDTIME 02/18/23   [name], NP  senna (SENOKOT) 8.6 MG TABS tablet Take 1 tablet (8.6 mg total) by mouth 2 (two) times daily. 07/07/22   Lucy Chris, MD  tamoxifen (NOLVADEX) 20 MG tablet Take 1 tablet (20 mg total) by mouth daily. 08/13/22   [name], MD  traMADol-acetaminophen (ULTRACET) 37.5-325 MG tablet Take 1 tablet by  mouth every 6 (six) hours as needed for moderate pain or severe pain. This is a refill 12/19/22   [name], NP  triamcinolone (KENALOG) 0.025 % cream Apply 1 Application topically 2 (two) times daily as needed (itching). To groin area 12/19/22   [name], NP    Physical Exam: Vitals:   02/23/23 2149 02/23/23 2300 02/24/23 0000 02/24/23 0100  BP:  (!) 97/51 (!) 110/39   Pulse:  95 91   Resp:  20 20   Temp: (!) 100.5 F (38.1 C)     TempSrc: Oral     SpO2:  98% 98% 100%   General: Not in acute distress HEENT:       Eyes: PERRL, EOMI, no jaundice       ENT: No discharge from the ears and nose, no pharynx injection, no tonsillar enlargement.        Neck: No JVD, no bruit, no mass felt. Heme: No neck lymph node enlargement. Cardiac: S1/S2, RRR, No murmurs, No gallops or rubs. Respiratory: No rales, wheezing, rhonchi or rubs. GI: Soft, nondistended, has mild tenderness in lower abdomen, no rebound pain, no organomegaly, BS present. GU: No hematuria Ext: Has trace leg edema bilaterally. 1+DP/PT pulse bilaterally. Musculoskeletal: Has tenderness in his right knee and lower back  skin: Has a diabetic ulcer in right second toe, no active drainage or surrounding erythema.     Neuro: Alert, oriented X3, cranial nerves II-XII grossly intact, moves all extremities  Psych: Patient is not psychotic, no suicidal or hemocidal ideation.  Labs on Admission: I have  personally reviewed following labs and imaging studies  CBC: Recent Labs  Lab 02/23/23 1602  WBC 10.6*  NEUTROABS 9.8*  HGB 10.5*  HCT 33.3*  MCV 86.9  PLT 134*   Basic Metabolic Panel: Recent Labs  Lab 02/23/23 1602  NA 134*  K 3.9  CL 96*  CO2 26  GLUCOSE 240*  BUN 37*  CREATININE 1.91*  CALCIUM 8.7*   GFR: CrCl cannot be calculated (Unknown ideal weight.). Liver Function Tests: Recent Labs  Lab 02/23/23 1602  AST 26  ALT 19  ALKPHOS 81  BILITOT 1.3*  PROT 7.8  ALBUMIN 3.2*   No results for input(s): "LIPASE", "AMYLASE" in the last 168 hours. No results for input(s): "AMMONIA" in the last 168 hours. Coagulation Profile: Recent Labs  Lab 02/23/23 1602  INR 1.2   Cardiac Enzymes: No results for input(s): "CKTOTAL", "CKMB", "CKMBINDEX", "TROPONINI" in the last 168 hours. BNP (last 3 results) No results for input(s): "PROBNP" in the last 8760 hours. HbA1C: No results for input(s): "HGBA1C" in the last 72 hours. CBG: Recent Labs  Lab 02/23/23 2139  GLUCAP 176*   Lipid Profile: No results for input(s): "CHOL", "HDL", "LDLCALC", "TRIG", "CHOLHDL", "LDLDIRECT" in the last 72 hours. Thyroid Function Tests: No results for input(s): "TSH", "T4TOTAL", "FREET4", "T3FREE", "THYROIDAB" in the last 72 hours. Anemia Panel: No results for input(s): "VITAMINB12", "FOLATE", "FERRITIN", "TIBC", "IRON", "RETICCTPCT" in the last 72 hours. Urine analysis:    Component Value Date/Time   COLORURINE YELLOW (A) 02/23/2023 1737   APPEARANCEUR CLEAR (A) 02/23/2023 1737   APPEARANCEUR Clear 10/17/2022 1554   LABSPEC 1.010 02/23/2023 1737   PHURINE 6.0 02/23/2023 1737   GLUCOSEU NEGATIVE 02/23/2023 1737   HGBUR NEGATIVE 02/23/2023 1737   BILIRUBINUR NEGATIVE 02/23/2023 1737   BILIRUBINUR Negative 10/17/2022 1554   KETONESUR NEGATIVE 02/23/2023 1737   PROTEINUR NEGATIVE 02/23/2023 1737   NITRITE NEGATIVE 02/23/2023 1737   LEUKOCYTESUR  NEGATIVE 02/23/2023 1737    Sepsis Labs: @LABRCNTIP (procalcitonin:4,lacticidven:4) ) Recent Results (from the past 240 hour(s))  SARS Coronavirus 2 by RT PCR (hospital order, performed in [facility] hospital lab) *cepheid single result test* Anterior Nasal Swab     Status: None   Collection Time: 02/23/23  5:56 PM   Specimen: Anterior Nasal Swab  Result Value Ref Range Status   SARS Coronavirus 2 by RT PCR NEGATIVE NEGATIVE Final    Comment: (NOTE) SARS-CoV-2 target nucleic acids are NOT DETECTED.  The SARS-CoV-2 RNA is generally detectable in upper and lower respiratory specimens during the acute phase of infection. The lowest concentration of SARS-CoV-2 viral copies this assay can detect is 250 copies / mL. A negative result does not preclude SARS-CoV-2 infection and should not be used as the sole basis for treatment or other patient management decisions.  A negative result may occur with improper specimen collection / handling, submission of specimen other than nasopharyngeal swab, presence of viral mutation(s) within the areas targeted by this assay, and inadequate number of viral copies (<250 copies / mL). A negative result must be combined with clinical observations, patient history, and epidemiological information.  Fact Sheet for Patients:   RoadLapTop.co.za  Fact Sheet for Healthcare Providers: http://kim-miller.com/  This test is not yet approved or  cleared by the [place] FDA and has been authorized for detection and/or diagnosis of SARS-CoV-2 by FDA under an Emergency Use Authorization (EUA).  This EUA will remain in effect (meaning this test can be used) for the duration of the COVID-19 declaration under Section 564(b)(1) of the Act, 21 U.S.C. section 360bbb-3(b)(1), unless the authorization is terminated or revoked sooner.  Performed at [facility], [address] [place], [place] [address]      Radiological Exams on  Admission: CT CHEST ABDOMEN PELVIS WO CONTRAST  Result Date: 02/23/2023 CLINICAL DATA:  Sepsis, fall, back pain EXAM: CT CHEST, ABDOMEN AND PELVIS WITHOUT CONTRAST TECHNIQUE: Multidetector CT imaging of the chest, abdomen and pelvis was performed following the standard protocol without IV contrast. RADIATION DOSE REDUCTION: This exam was performed according to the departmental dose-optimization program which includes automated exposure control, adjustment of the mA and/or kV according to patient size and/or use of iterative reconstruction technique. COMPARISON:  None Available. FINDINGS: CT CHEST FINDINGS Cardiovascular: Mild coronary artery calcification. Global cardiac size within normal limits. No pericardial effusion. Central pulmonary arteries are enlarged in keeping with changes of pulmonary arterial hypertension. Moderate atherosclerotic calcification within the thoracic aorta. No aortic aneurysm. Mediastinum/Nodes: No enlarged mediastinal, hilar, or axillary lymph nodes. Thyroid gland, trachea, and esophagus demonstrate no significant findings. Lungs/Pleura: Clustered nodules within the a right lower lobe at axial image # 95/4 measuring less than 4 mm in diameter are nonspecific, but likely post infectious or post inflammatory in nature. Lungs are otherwise clear. No pneumothorax or pleural effusion. Musculoskeletal: Osseous structures are age-appropriate. No acute bone abnormality. No lytic or blastic bone lesion. CT ABDOMEN PELVIS FINDINGS Hepatobiliary: Mild hepatomegaly and hepatic steatosis. No definite intrahepatic mass. No intra or extrahepatic biliary ductal dilation. Status post cholecystectomy. Pancreas: Unremarkable Spleen: Unremarkable Adrenals/Urinary Tract: Adrenal glands are unremarkable. Kidneys are normal, without renal calculi, focal lesion, or hydronephrosis. Bladder is unremarkable. Stomach/Bowel: There is severe diverticulosis involving a redundant sigmoid colon. There is focal mural  thickening and pericolonic infiltration along the mid sigmoid colon where an extraluminal contained collection of stool demonstrating a relatively thick rind demonstrates communication between several loops of a sigmoid colon in  keeping with a chronic colo-colonic fistula, likely the result of prior diverticulitis. This is best seen on coronal image # 49-63 and axial image # 93/2. An infiltrative malignancy is less likely, but not completely excluded. The stomach, small bowel, and large bowel are otherwise unremarkable. No evidence of obstruction. Appendix normal. No free intraperitoneal gas or fluid. Vascular/Lymphatic: Aortic atherosclerosis. No enlarged abdominal or pelvic lymph nodes. Reproductive: Uterus and bilateral adnexa are unremarkable. Other: Small multilobulated fat containing umbilical hernia. Musculoskeletal: Postsurgical changes of L3-L5 lumbar fusion with instrumentation are seen within the lumbar spine. No acute bone abnormality. No lytic or blastic bone lesion. IMPRESSION: 1. No acute intrathoracic or intra-abdominal pathology identified. 2. Mild coronary artery calcification. 3. Morphologic changes in keeping with pulmonary arterial hypertension. 4. Mild hepatomegaly and hepatic steatosis. 5. Severe sigmoid diverticulosis. 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 An infiltrative malignancy is less likely, but not completely excluded. Correlation with colonoscopy may be helpful for further evaluation. Aortic Atherosclerosis (ICD10-I70.0). Electronically Signed   By: Helyn Numbers M.D.   On: 02/23/2023 19:07   DG Knee Complete 4 Views Right  Result Date: 02/23/2023 CLINICAL DATA:  Pain after fall EXAM: RIGHT  KNEE - COMPLETE 4 VIEW COMPARISON:  None Available. FINDINGS: Osteopenia. Osteophyte formation seen along the medial compartment and patellofemoral joint. Grossly preserved joint spaces. No joint effusion on lateral view. IMPRESSION: Osteopenia.  Degenerative changes Electronically Signed   By: Karen Kays M.D.   On: 02/23/2023 17:23   DG Chest 1 View  Result Date: 02/23/2023 CLINICAL DATA:  Pain after fall.  Sepsis EXAM: CHEST  1 VIEW COMPARISON:  12/21/2021 and older. FINDINGS: The heart size and mediastinal contours are within normal limits. No consolidation, pneumothorax or effusion. No edema. Stable enlargement of the pulmonary artery centrally. The visualized skeletal structures are unremarkable. Overlapping cardiac leads. IMPRESSION: No acute cardiopulmonary disease. Electronically Signed   By: Karen Kays M.D.   On: 02/23/2023 17:22   DG Hip Unilat W or Wo Pelvis 2-3 Views Right  Result Date: 02/23/2023 CLINICAL DATA:  Pain.  Fall EXAM: DG HIP (WITH OR WITHOUT PELVIS) 3V RIGHT COMPARISON:  None Available. FINDINGS: Osteopenia. No fracture or dislocation. Preserved joint spaces of the hips. There is slight sclerosis along the sacroiliac joints. Fixation hardware seen along the lower lumbar spine at the edge of the imaging field. With this level of osteopenia subtle nondisplaced injury is difficult to completely exclude and if there is further concern additional workup with CT or MRI as clinically appropriate. IMPRESSION: Osteopenia.  No acute osseous abnormality. Electronically Signed   By: Karen Kays M.D.   On: 02/23/2023 17:21   CT Head Wo Contrast  Result Date: 02/23/2023 CLINICAL DATA:  Fall EXAM: CT HEAD WITHOUT CONTRAST TECHNIQUE: Contiguous axial images were obtained from the base of the skull through the vertex without intravenous contrast. RADIATION DOSE REDUCTION: This exam was performed according to the departmental dose-optimization program which includes automated exposure  control, adjustment of the mA and/or kV according to patient size and/or use of iterative reconstruction technique. COMPARISON:  None Available. FINDINGS: Brain: There is no acute intracranial hemorrhage, extra-axial fluid collection, or acute infarct. Parenchymal volume is within expected limits for age. The ventricles are normal in size. Gray-white differentiation is preserved.  The pituitary and suprasellar region are normal. There is no mass lesion. There is no mass effect or midline shift. Vascular: No hyperdense vessel or unexpected calcification. Skull: Normal. Negative for fracture or focal lesion. Sinuses/Orbits: The imaged paranasal sinuses are clear. Bilateral lens implants are in place. The globes and orbits are otherwise unremarkable. Other: The mastoid air cells and middle ear cavities are clear. IMPRESSION: No acute intracranial pathology. Electronically Signed   By: Lesia Hausen M.D.   On: 02/23/2023 17:11      Assessment/Plan Principal Problem:   Sepsis (HCC) Active Problems:   Fistula of sigmoid colon   Fall at home, initial encounter   Type II diabetes mellitus with renal manifestations (HCC)   HLD (hyperlipidemia)   Chronic diastolic CHF (congestive heart failure) (HCC)   CKD (chronic kidney disease), stage IV (HCC)   Iron deficiency anemia   Diabetic foot ulcer_ right second toe   Essential hypertension   Chronic bilateral low back pain with left-sided sciatica   Assessment and Plan:  Sepsis (HCC) due to possible fistula of sigmoid colon-related infection or diverticulitis.  CT scan showed severe sigmoid diverticulosis and focal mural thickening and pericolonic infiltration along the mid sigmoid colon and chronic colo-colonic fistula. Pt has sepsis with WBC 10.6, lactic acid of 1.9, heart rate 138, RR 36, temperature 103.2.  Blood pressure is soft.  -Admitted to telemetry bed as inpatient -IV Zosyn -Blood culture -IV fluid: Total 2.5 L normal saline, then 75  cc/h -Check procalcitonin level -As needed morphine, Ultracet, Tylenol for pain -As needed Zofran for nausea vomiting -Patient will need to be given referral to GI for colonoscopy, since malignancy cannot be completely ruled out.  Fall at home, initial encounter: CT head negative.  No acute injury on imaging of x-ray of right knee and right hip/pelvis -Fall precaution -PT/OT  Type II diabetes mellitus with renal manifestations [facility]): Recent A1c 8.3, poorly controlled.  Patient is taking Trulicity, glipizide, NovoLog, Levemir 40 units daily -SSI -Glargine insulin 20 units daily  HLD (hyperlipidemia) -Crestor  Chronic diastolic CHF (congestive heart failure) (HCC): 2D echo on 12/25/2021 showed EF 60-85% with grade 1 diastolic dysfunction.  Patient has trace leg edema, but no pulm edema chest x-ray, BNP normal 63.  CHF seem to be compensated. -Hold Lasix due to sepsis and worsening renal function  CKD (chronic kidney disease), stage IV [facility]): Renal function is worsening.  Baseline creatinine 1.35-1.6.  Her creatinine is 1.91, BUN 37, GFR 27.  Likely due to dehydration and continuation of Lasix. -Hold Lasix -IV fluid as above  Iron deficiency anemia: Hemoglobin stable, 10.5 (9.5 on 07/29/2022) -Follow-up by CBC -Continue iron supplement  Diabetic foot ulcer_ right second toe: No active drainage. Not sure if it is infected -Observe closely -Patient is following up with podiatry, Dr. Allena Katz -Wound care per nurse  Essential hypertension -IV hydralazine as needed -Hold Lasix  Chronic bilateral low back pain with left-sided sciatica -Continue home Ultracet as needed       DVT ppx: SQ Heparin    Code Status: Full code (patient had MOST form of DNR in 2020, but today I discussed with patient and explained the meaning of CODE STATUS. Patient wants to be Full Code)  Family Communication: not done, no family member is at bed side.    Disposition Plan:  Anticipate discharge back to  previous environment  Consults called:  none   Admission status and Level of care: Telemetry Medical:    as inpt  Dispo: The patient is from: Home              Anticipated d/c is to: Home              Anticipated d/c date is: 2 days              Patient currently is not medically stable to d/c.    Severity of Illness:  The appropriate patient status for this patient is INPATIENT. Inpatient status is judged to be reasonable and necessary in order to provide the required intensity of service to ensure the patient's safety. The patient's presenting symptoms, physical exam findings, and initial radiographic and laboratory data in the context of their chronic comorbidities is felt to place them at high risk for further clinical deterioration. Furthermore, it is not anticipated that the patient will be medically stable for discharge from the hospital within 2 midnights of admission.   * I certify that at the point of admission it is my clinical judgment that the patient will require inpatient hospital care spanning beyond 2 midnights from the point of admission due to high intensity of service, high risk for further deterioration and high frequency of surveillance required.*       Date of Service 02/24/2023    Lorretta Harp [facility]   If 7PM-7AM, please contact night-coverage [email or web address] 02/24/2023, 1:17 AM

## 2023-02-23 NOTE — Progress Notes (Signed)
Pharmacy Antibiotic Note  Kara [name] is a 77 y.o. female admitted on 02/23/2023 with sepsis, possibly related to diverticulitis.  Pharmacy has been consulted for Zosyn dosing.  Plan: Zosyn 3.375g IV q8h (4 hour infusion).     Temp (24hrs), Avg:101.1 F (38.4 C), Min:98.3 F (36.8 C), Max:103.2 F (39.6 C)  Recent Labs  Lab 02/23/23 1600 02/23/23 1602  WBC  --  10.6*  CREATININE  --  1.91*  LATICACIDVEN 1.9  --     CrCl cannot be calculated (Unknown ideal weight.).    Allergies  Allergen Reactions   Amoxicillin Nausea And Vomiting    Antimicrobials this admission: Zosyn 11/3  >>     Dose adjustments this admission:  Microbiology results:  Thank you for allowing pharmacy to be a part of this patient's care.  Clovia Cuff, PharmD, BCPS 02/23/2023 8:44 PM

## 2023-02-23 NOTE — ED Notes (Signed)
Hospitalist made aware of pt low trending BP. MD was at bedside and gave verbal order for NS.

## 2023-02-24 ENCOUNTER — Inpatient Hospital Stay: Payer: Medicare HMO

## 2023-02-24 DIAGNOSIS — A408 Other streptococcal sepsis: Secondary | ICD-10-CM

## 2023-02-24 DIAGNOSIS — R6520 Severe sepsis without septic shock: Secondary | ICD-10-CM | POA: Diagnosis not present

## 2023-02-24 LAB — BASIC METABOLIC PANEL
Anion gap: 10 (ref 5–15)
BUN: 31 mg/dL — ABNORMAL HIGH (ref 8–23)
CO2: 25 mmol/L (ref 22–32)
Calcium: 7.9 mg/dL — ABNORMAL LOW (ref 8.9–10.3)
Chloride: 103 mmol/L (ref 98–111)
Creatinine, Ser: 1.84 mg/dL — ABNORMAL HIGH (ref 0.44–1.00)
GFR, Estimated: 28 mL/min — ABNORMAL LOW (ref >60)
Glucose, Bld: 218 mg/dL — ABNORMAL HIGH (ref 70–99)
Potassium: 3.3 mmol/L — ABNORMAL LOW (ref 3.5–5.1)
Sodium: 138 mmol/L (ref 135–145)

## 2023-02-24 LAB — BLOOD CULTURE ID PANEL (REFLEXED) - BCID2

## 2023-02-24 LAB — RESPIRATORY PANEL BY PCR

## 2023-02-24 LAB — CBG MONITORING, ED
Glucose-Capillary: 213 mg/dL — ABNORMAL HIGH (ref 70–99)
Glucose-Capillary: 205 mg/dL — ABNORMAL HIGH (ref 70–99)
Glucose-Capillary: 172 mg/dL — ABNORMAL HIGH (ref 70–99)
Glucose-Capillary: 177 mg/dL — ABNORMAL HIGH (ref 70–99)

## 2023-02-24 LAB — CBC
HCT: 27.6 % — ABNORMAL LOW (ref 36.0–46.0)
Hemoglobin: 8.6 g/dL — ABNORMAL LOW (ref 12.0–15.0)
MCH: 27.7 pg (ref 26.0–34.0)
MCHC: 31.2 g/dL (ref 30.0–36.0)
MCV: 89 fL (ref 80.0–100.0)
Platelets: 107 10*3/uL — ABNORMAL LOW (ref 150–400)
RBC: 3.1 MIL/uL — ABNORMAL LOW (ref 3.87–5.11)
RDW: 17.7 % — ABNORMAL HIGH (ref 11.5–15.5)
WBC: 11.5 10*3/uL — ABNORMAL HIGH (ref 4.0–10.5)
nRBC: 0 % (ref 0.0–0.2)

## 2023-02-24 MED ORDER — PREGABALIN 50 MG PO CAPS
100.0000 mg | ORAL_CAPSULE | Freq: Two times a day (BID) | ORAL | Status: DC
  Administered 2023-02-24 – 2023-02-28 (×8): 100 mg via ORAL
  Filled 2023-02-24 (×8): qty 2

## 2023-02-24 MED ORDER — IBUPROFEN 400 MG PO TABS
600.0000 mg | ORAL_TABLET | Freq: Once | ORAL | Status: AC
  Administered 2023-02-24: 600 mg via ORAL
  Filled 2023-02-24: qty 2

## 2023-02-24 MED ORDER — INSULIN GLARGINE-YFGN 100 UNIT/ML ~~LOC~~ SOLN
20.0000 [IU] | Freq: Every day | SUBCUTANEOUS | Status: DC
  Administered 2023-02-24 – 2023-02-27 (×4): 20 [IU] via SUBCUTANEOUS
  Filled 2023-02-24 (×7): qty 0.2

## 2023-02-24 MED ORDER — FERROUS GLUCONATE 324 (38 FE) MG PO TABS
324.0000 mg | ORAL_TABLET | Freq: Every day | ORAL | Status: DC
Start: 2023-02-24 — End: 2023-02-28
  Administered 2023-02-24 – 2023-02-28 (×4): 324 mg via ORAL
  Filled 2023-02-24 (×4): qty 1

## 2023-02-24 MED ORDER — SODIUM CHLORIDE 0.9 % IV BOLUS
500.0000 mL | Freq: Once | INTRAVENOUS | Status: AC
  Administered 2023-02-24: 500 mL via INTRAVENOUS

## 2023-02-24 MED ORDER — ROSUVASTATIN CALCIUM 10 MG PO TABS
20.0000 mg | ORAL_TABLET | Freq: Every day | ORAL | Status: DC
  Administered 2023-02-24 – 2023-02-28 (×4): 20 mg via ORAL
  Filled 2023-02-24: qty 2
  Filled 2023-02-24: qty 1
  Filled 2023-02-24 (×2): qty 2

## 2023-02-24 MED ORDER — PANTOPRAZOLE SODIUM 40 MG PO TBEC
40.0000 mg | DELAYED_RELEASE_TABLET | Freq: Every day | ORAL | Status: DC
  Administered 2023-02-24 – 2023-02-28 (×4): 40 mg via ORAL
  Filled 2023-02-24 (×4): qty 1

## 2023-02-24 NOTE — ED Notes (Signed)
This RN to room, pt c/o "freezing" arm shaking noted, temperature obtained; 101.2

## 2023-02-24 NOTE — ED Notes (Signed)
Pt temp 101.2, Tylenol given at 12:11, repeat temp at 12:58, temperature 102.5 This RN notified attending Esquivias MD, awaiting orders

## 2023-02-24 NOTE — ED Notes (Signed)
X-ray at bedside

## 2023-02-24 NOTE — Progress Notes (Signed)
PT Cancellation Note  Patient Details Name: Kara [name] MRN: [id number] DOB: 1946-03-04   Cancelled Treatment:    Reason Eval/Treat Not Completed: Other (comment).  Chart reviewed and attempted to see pt.  Pt noted to be having shallow breathing and shivering.  Pt given warm blankets and educated on pursed lip breathing.  Pt's SpO2 returned >90% after education and proper performance.  Pt elected to defer therapy at this time.  Will re-attempt at later date/time as medically appropriate.     Nolon Bussing, PT, DPT Physical Therapist - [facility]  02/24/23, 12:44 PM

## 2023-02-24 NOTE — Progress Notes (Signed)
PROGRESS NOTE    Kara [name]  WUJ:[id number] DOB: 1945/12/14 DOA: 02/23/2023 PCP: [name], NP    Assessment & Plan:   Principal Problem:   Sepsis [facility]) Active Problems:   Fistula of sigmoid colon   Fall at home, initial encounter   Type II diabetes mellitus with renal manifestations (HCC)   HLD (hyperlipidemia)   Chronic diastolic CHF (congestive heart failure) (HCC)   CKD (chronic kidney disease), stage IV (HCC)   Iron deficiency anemia   Diabetic foot ulcer_ right second toe   Essential hypertension   Chronic bilateral low back pain with left-sided sciatica  Assessment and Plan: Sepsis: met criteria w/ leukocytosis, tachycardia, tachypnea, fever and bacteremia likely secondary to right 2nd toe osteomyelitis. Continue on IV zosyn.  Ultracet, tylenol prn for pain. Zofran prn nausea/vomiting. Continue on IVFs  Bacteremia: blood cxs growing streg agalactiae. Likely secondary to 2nd toe osteomyelitis. Has Continue on IV zosyn  Likely right 2nd toe osteomyelitis: as per XR. Continue on IV zosyn. Podiatry consulted. Likely etiology of bacteremia.    Fall at home, initial encounter: CT head negative.  No acute injury on imaging of x-ray of right knee and right hip/pelvis. PT/OT consulted    DM2: poorly controlled. Continue on glargine, SSI w/ accuhecks    HLD: continue on statin    Chronic diastolic CHF: echo on 12/25/2021 showd EF 60-85% with grade I diastolic dysfunction. Trace leg edema, but no pulm edema chest x-ray, BNP normal 63.  CHF seem to be compensated. Holding lasix. Monitor I/Os    CKDIV: baseline Cr 1.35-1.6. Cr is labile. Avoid nephrotoxic meds    IDA: continue on iron supplement    Chronic bilateral low back pain with left-sided sciatica: continue on home dose of ultracet prn    Thrombocytopenia: etiology unclear. Will continue to monitor      DVT prophylaxis: heparin SQ Code Status: full  Family Communication: discussed pt's care w/ pt's family  at bedside and answered their questions  Disposition Plan: depends on PT/OT recs   Level of care: Telemetry Medical Status is: Inpatient Remains inpatient appropriate because: severity of illness    Consultants:  Podiatry   Procedures:  Antimicrobials: zosyn    Subjective: Pt c/o chills   Objective: Vitals:   02/24/23 0730 02/24/23 0745 02/24/23 0759 02/24/23 0800  BP: (!) 106/48   (!) 108/53  Pulse: 95 86  85  Resp: (!) 24 (!) 22  (!) 23  Temp:   100.1 F (37.8 C)   TempSrc:   Oral   SpO2: 92% 95%  95%    Intake/Output Summary (Last 24 hours) at 02/24/2023 0900 Last data filed at 02/24/2023 0514 Gross per 24 hour  Intake 2599 ml  Output 625 ml  Net 1974 ml   There were no vitals filed for this visit.  Examination:  General exam: Appears uncomfortable  Respiratory system: Clear to auscultation. Respiratory effort normal. Cardiovascular system: S1 & S2+. No rubs, gallops or clicks. Gastrointestinal system: Abdomen is nondistended, soft and nontender. Normal bowel sounds heard. Central nervous system: Alert and oriented. Moves all extremities  Psychiatry: Judgement and insight appear normal. Flat mood and affect     Data Reviewed: I have personally reviewed following labs and imaging studies  CBC: Recent Labs  Lab 02/23/23 1602 02/24/23 0541  WBC 10.6* 11.5*  NEUTROABS 9.8*  --   HGB 10.5* 8.6*  HCT 33.3* 27.6*  MCV 86.9 89.0  PLT 134* 107*   Basic Metabolic Panel:  Recent Labs  Lab 02/23/23 1602 02/24/23 0541  NA 134* 138  K 3.9 3.3*  CL 96* 103  CO2 26 25  GLUCOSE 240* 218*  BUN 37* 31*  CREATININE 1.91* 1.84*  CALCIUM 8.7* 7.9*   GFR: CrCl cannot be calculated (Unknown ideal weight.). Liver Function Tests: Recent Labs  Lab 02/23/23 1602  AST 26  ALT 19  ALKPHOS 81  BILITOT 1.3*  PROT 7.8  ALBUMIN 3.2*   No results for input(s): "LIPASE", "AMYLASE" in the last 168 hours. No results for input(s): "AMMONIA" in the last 168  hours. Coagulation Profile: Recent Labs  Lab 02/23/23 1602  INR 1.2   Cardiac Enzymes: No results for input(s): "CKTOTAL", "CKMB", "CKMBINDEX", "TROPONINI" in the last 168 hours. BNP (last 3 results) No results for input(s): "PROBNP" in the last 8760 hours. HbA1C: No results for input(s): "HGBA1C" in the last 72 hours. CBG: Recent Labs  Lab 02/23/23 2139 02/24/23 0538 02/24/23 0718  GLUCAP 176* 213* 205*   Lipid Profile: No results for input(s): "CHOL", "HDL", "LDLCALC", "TRIG", "CHOLHDL", "LDLDIRECT" in the last 72 hours. Thyroid Function Tests: No results for input(s): "TSH", "T4TOTAL", "FREET4", "T3FREE", "THYROIDAB" in the last 72 hours. Anemia Panel: No results for input(s): "VITAMINB12", "FOLATE", "FERRITIN", "TIBC", "IRON", "RETICCTPCT" in the last 72 hours. Sepsis Labs: Recent Labs  Lab 02/23/23 1600  PROCALCITON 0.79  LATICACIDVEN 1.9    Recent Results (from the past 240 hour(s))  Culture, blood (routine x 2)     Status: None (Preliminary result)   Collection Time: 02/23/23  4:00 PM   Specimen: BLOOD  Result Value Ref Range Status   Specimen Description BLOOD LEFT ANTECUBITAL  Final   Special Requests   Final    BOTTLES DRAWN AEROBIC AND ANAEROBIC Blood Culture adequate volume   Culture  Setup Time   Final    GRAM POSITIVE COCCI ANAEROBIC BOTTLE ONLY Performed at [facility], [address], [place], [place] [address]    Culture GRAM POSITIVE COCCI  Final   Report Status PENDING  Incomplete  Culture, blood (routine x 2)     Status: None (Preliminary result)   Collection Time: 02/23/23  4:00 PM   Specimen: BLOOD  Result Value Ref Range Status   Specimen Description BLOOD BLOOD LEFT FOREARM  Final   Special Requests   Final    BOTTLES DRAWN AEROBIC AND ANAEROBIC Blood Culture adequate volume   Culture  Setup Time   Final    GRAM POSITIVE COCCI IN BOTH AEROBIC AND ANAEROBIC BOTTLES Organism ID to follow Performed at [facility],  [address] [place], [place] [address]    Culture GRAM POSITIVE COCCI  Final   Report Status PENDING  Incomplete  Blood Culture ID Panel (Reflexed)     Status: Abnormal   Collection Time: 02/23/23  4:00 PM  Result Value Ref Range Status   Enterococcus faecalis NOT DETECTED NOT DETECTED Final   Enterococcus Faecium NOT DETECTED NOT DETECTED Final   Listeria monocytogenes NOT DETECTED NOT DETECTED Final   Staphylococcus species NOT DETECTED NOT DETECTED Final   Staphylococcus aureus (BCID) NOT DETECTED NOT DETECTED Final   Staphylococcus epidermidis NOT DETECTED NOT DETECTED Final   Staphylococcus lugdunensis NOT DETECTED NOT DETECTED Final   Streptococcus species DETECTED (A) NOT DETECTED Final    Comment: CRITICAL RESULT CALLED TO, READ BACK BY AND VERIFIED WITH: CAROLINE CHILDS PHARMD 0732 02/24/23 HNM    Streptococcus agalactiae DETECTED (A) NOT DETECTED Final  Comment: CRITICAL RESULT CALLED TO, READ BACK BY AND VERIFIED WITH: CAROLINE CHILDS PHARMD 0732 02/24/23 HNM    Streptococcus pneumoniae NOT DETECTED NOT DETECTED Final   Streptococcus pyogenes NOT DETECTED NOT DETECTED Final   A.calcoaceticus-baumannii NOT DETECTED NOT DETECTED Final   Bacteroides fragilis NOT DETECTED NOT DETECTED Final   Enterobacterales NOT DETECTED NOT DETECTED Final   Enterobacter cloacae complex NOT DETECTED NOT DETECTED Final   Escherichia coli NOT DETECTED NOT DETECTED Final   Klebsiella aerogenes NOT DETECTED NOT DETECTED Final   Klebsiella oxytoca NOT DETECTED NOT DETECTED Final   Klebsiella pneumoniae NOT DETECTED NOT DETECTED Final   Proteus species NOT DETECTED NOT DETECTED Final   Salmonella species NOT DETECTED NOT DETECTED Final   Serratia marcescens NOT DETECTED NOT DETECTED Final   Haemophilus influenzae NOT DETECTED NOT DETECTED Final   Neisseria meningitidis NOT DETECTED NOT DETECTED Final   Pseudomonas aeruginosa NOT DETECTED NOT DETECTED Final   Stenotrophomonas maltophilia  NOT DETECTED NOT DETECTED Final   Candida albicans NOT DETECTED NOT DETECTED Final   Candida auris NOT DETECTED NOT DETECTED Final   Candida glabrata NOT DETECTED NOT DETECTED Final   Candida krusei NOT DETECTED NOT DETECTED Final   Candida parapsilosis NOT DETECTED NOT DETECTED Final   Candida tropicalis NOT DETECTED NOT DETECTED Final   Cryptococcus neoformans/gattii NOT DETECTED NOT DETECTED Final    Comment: Performed at [facility] [facility], [address] [place], [place] [address]  SARS Coronavirus 2 by RT PCR (hospital order, performed in [facility] hospital lab) *cepheid single result test* Anterior Nasal Swab     Status: None   Collection Time: 02/23/23  5:56 PM   Specimen: Anterior Nasal Swab  Result Value Ref Range Status   SARS Coronavirus 2 by RT PCR NEGATIVE NEGATIVE Final    Comment: (NOTE) SARS-CoV-2 target nucleic acids are NOT DETECTED.  The SARS-CoV-2 RNA is generally detectable in upper and lower respiratory specimens during the acute phase of infection. The lowest concentration of SARS-CoV-2 viral copies this assay can detect is 250 copies / mL. A negative result does not preclude SARS-CoV-2 infection and should not be used as the sole basis for treatment or other patient management decisions.  A negative result may occur with improper specimen collection / handling, submission of specimen other than nasopharyngeal swab, presence of viral mutation(s) within the areas targeted by this assay, and inadequate number of viral copies (<250 copies / mL). A negative result must be combined with clinical observations, patient history, and epidemiological information.  Fact Sheet for Patients:   RoadLapTop.co.za  Fact Sheet for Healthcare Providers: http://kim-miller.com/  This test is not yet approved or  cleared by the [place] FDA and has been authorized for detection and/or diagnosis of SARS-CoV-2 by FDA under  an Emergency Use Authorization (EUA).  This EUA will remain in effect (meaning this test can be used) for the duration of the COVID-19 declaration under Section 564(b)(1) of the Act, 21 U.S.C. section 360bbb-3(b)(1), unless the authorization is terminated or revoked sooner.  Performed at [facility], [address] [place], [place] [address]   Respiratory (~20 pathogens) panel by PCR     Status: None   Collection Time: 02/23/23  9:42 PM   Specimen: Nasopharyngeal Swab; Respiratory  Result Value Ref Range Status   Adenovirus NOT DETECTED NOT DETECTED Final   Coronavirus 229E NOT DETECTED NOT DETECTED Final    Comment: (NOTE) The Coronavirus on the Respiratory Panel, DOES NOT test for  the novel  Coronavirus (2019 nCoV)    Coronavirus HKU1 NOT DETECTED NOT DETECTED Final   Coronavirus NL63 NOT DETECTED NOT DETECTED Final   Coronavirus OC43 NOT DETECTED NOT DETECTED Final   Metapneumovirus NOT DETECTED NOT DETECTED Final   Rhinovirus / Enterovirus NOT DETECTED NOT DETECTED Final   Influenza A NOT DETECTED NOT DETECTED Final   Influenza B NOT DETECTED NOT DETECTED Final   Parainfluenza Virus 1 NOT DETECTED NOT DETECTED Final   Parainfluenza Virus 2 NOT DETECTED NOT DETECTED Final   Parainfluenza Virus 3 NOT DETECTED NOT DETECTED Final   Parainfluenza Virus 4 NOT DETECTED NOT DETECTED Final   Respiratory Syncytial Virus NOT DETECTED NOT DETECTED Final   Bordetella pertussis NOT DETECTED NOT DETECTED Final   Bordetella Parapertussis NOT DETECTED NOT DETECTED Final   Chlamydophila pneumoniae NOT DETECTED NOT DETECTED Final   Mycoplasma pneumoniae NOT DETECTED NOT DETECTED Final    Comment: Performed at [facility], [address], [place], [place] [address]         Radiology Studies: CT CHEST ABDOMEN PELVIS WO CONTRAST  Result Date: 02/23/2023 CLINICAL DATA:  Sepsis, fall, back pain EXAM: CT CHEST, ABDOMEN AND PELVIS WITHOUT CONTRAST TECHNIQUE: Multidetector CT  imaging of the chest, abdomen and pelvis was performed following the standard protocol without IV contrast. RADIATION DOSE REDUCTION: This exam was performed according to the departmental dose-optimization program which includes automated exposure control, adjustment of the mA and/or kV according to patient size and/or use of iterative reconstruction technique. COMPARISON:  None Available. FINDINGS: CT CHEST FINDINGS Cardiovascular: Mild coronary artery calcification. Global cardiac size within normal limits. No pericardial effusion. Central pulmonary arteries are enlarged in keeping with changes of pulmonary arterial hypertension. Moderate atherosclerotic calcification within the thoracic aorta. No aortic aneurysm. Mediastinum/Nodes: No enlarged mediastinal, hilar, or axillary lymph nodes. Thyroid gland, trachea, and esophagus demonstrate no significant findings. Lungs/Pleura: Clustered nodules within the a right lower lobe at axial image # 95/4 measuring less than 4 mm in diameter are nonspecific, but likely post infectious or post inflammatory in nature. Lungs are otherwise clear. No pneumothorax or pleural effusion. Musculoskeletal: Osseous structures are age-appropriate. No acute bone abnormality. No lytic or blastic bone lesion. CT ABDOMEN PELVIS FINDINGS Hepatobiliary: Mild hepatomegaly and hepatic steatosis. No definite intrahepatic mass. No intra or extrahepatic biliary ductal dilation. Status post cholecystectomy. Pancreas: Unremarkable Spleen: Unremarkable Adrenals/Urinary Tract: Adrenal glands are unremarkable. Kidneys are normal, without renal calculi, focal lesion, or hydronephrosis. Bladder is unremarkable. Stomach/Bowel: There is severe diverticulosis involving a redundant sigmoid colon. 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 This is best seen on coronal image # 49-63 and axial image # 93/2. An infiltrative malignancy is less likely, but not completely excluded. The stomach, small bowel, and large bowel are otherwise unremarkable. No evidence of obstruction. Appendix normal. No free intraperitoneal gas or fluid. Vascular/Lymphatic: Aortic atherosclerosis. No enlarged abdominal or pelvic lymph nodes. Reproductive: Uterus and bilateral adnexa are unremarkable. Other: Small multilobulated fat containing umbilical hernia. Musculoskeletal: Postsurgical changes of L3-L5 lumbar fusion with instrumentation are seen within the lumbar spine. No acute bone abnormality. No lytic or blastic bone lesion. IMPRESSION: 1. No acute intrathoracic or intra-abdominal pathology identified. 2. Mild coronary artery calcification. 3. Morphologic changes in keeping  with pulmonary arterial hypertension. 4. Mild hepatomegaly and hepatic steatosis. 5. Severe sigmoid diverticulosis. 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 An infiltrative malignancy is less likely, but not completely excluded. Correlation with colonoscopy may be helpful for further evaluation. Aortic Atherosclerosis (ICD10-I70.0). Electronically Signed   By: Helyn Numbers M.D.   On: 02/23/2023 19:07   DG Knee Complete 4 Views Right  Result Date: 02/23/2023 CLINICAL DATA:  Pain after fall EXAM: RIGHT KNEE - COMPLETE 4 VIEW COMPARISON:  None Available. FINDINGS: Osteopenia. Osteophyte formation seen along the medial compartment and patellofemoral joint. Grossly preserved joint spaces. No joint  effusion on lateral view. IMPRESSION: Osteopenia.  Degenerative changes Electronically Signed   By: Karen Kays M.D.   On: 02/23/2023 17:23   DG Chest 1 View  Result Date: 02/23/2023 CLINICAL DATA:  Pain after fall.  Sepsis EXAM: CHEST  1 VIEW COMPARISON:  12/21/2021 and older. FINDINGS: The heart size and mediastinal contours are within normal limits. No consolidation, pneumothorax or effusion. No edema. Stable enlargement of the pulmonary artery centrally. The visualized skeletal structures are unremarkable. Overlapping cardiac leads. IMPRESSION: No acute cardiopulmonary disease. Electronically Signed   By: Karen Kays M.D.   On: 02/23/2023 17:22   DG Hip Unilat W or Wo Pelvis 2-3 Views Right  Result Date: 02/23/2023 CLINICAL DATA:  Pain.  Fall EXAM: DG HIP (WITH OR WITHOUT PELVIS) 3V RIGHT COMPARISON:  None Available. FINDINGS: Osteopenia. No fracture or dislocation. Preserved joint spaces of the hips. There is slight sclerosis along the sacroiliac joints. Fixation hardware seen along the lower lumbar spine at the edge of the imaging field. With this level of osteopenia subtle nondisplaced injury is difficult to completely exclude and if there is further concern additional workup with CT or MRI as clinically appropriate. IMPRESSION: Osteopenia.  No acute osseous abnormality. Electronically Signed   By: Karen Kays M.D.   On: 02/23/2023 17:21   CT Head Wo Contrast  Result Date: 02/23/2023 CLINICAL DATA:  Fall EXAM: CT HEAD WITHOUT CONTRAST TECHNIQUE: Contiguous axial images were obtained from the base of the skull through the vertex without intravenous contrast. RADIATION DOSE REDUCTION: This exam was performed according to the departmental dose-optimization program which includes automated exposure control, adjustment of the mA and/or kV according to patient size and/or use of iterative reconstruction technique. COMPARISON:  None Available. FINDINGS: Brain: There is no acute intracranial  hemorrhage, extra-axial fluid collection, or acute infarct. Parenchymal volume is within expected limits for age. The ventricles are normal in size. Gray-white differentiation is preserved. The pituitary and suprasellar region are normal. There is no mass lesion. There is no mass effect or midline shift. Vascular: No hyperdense vessel or unexpected calcification. Skull: Normal. Negative for fracture or focal lesion. Sinuses/Orbits: The imaged paranasal sinuses are clear. Bilateral lens implants are in place. The globes and orbits are otherwise unremarkable. Other: The mastoid air cells and middle ear cavities are clear. IMPRESSION: No acute intracranial pathology. Electronically Signed   By: Lesia Hausen M.D.   On: 02/23/2023 17:11        Scheduled Meds:  acetaminophen  1,000 mg Oral Once   ferrous gluconate  324 mg Oral Daily   heparin  5,000 Units Subcutaneous Q8H   insulin aspart  0-5 Units Subcutaneous  QHS   insulin aspart  0-9 Units Subcutaneous TID WC   insulin glargine-yfgn  20 Units Subcutaneous QHS   pantoprazole  40 mg Oral Daily   pregabalin  100 mg Oral BID   rosuvastatin  20 mg Oral Daily   Continuous Infusions:  sodium chloride 75 mL/hr at 02/23/23 2143   piperacillin-tazobactam (ZOSYN)  IV Stopped (02/24/23 0850)     LOS: 1 day       Charise Killian, MD [facility] Pager [phone]  If 7PM-7AM, please contact night-coverage [email or web address] 02/24/2023, 9:00 AM

## 2023-02-24 NOTE — Evaluation (Signed)
Occupational Therapy Evaluation Patient Details Name: Kara [name] MRN: [id number] DOB: 1945/12/11 Today's Date: 02/24/2023   History of Present Illness 77 y.o. female with medical history significant of HTN, HDL, DM, diabetic neuropathy, dCHF, CKD-4, anemia, kidney stone, chronic back pain, right breast cancer-DCIS, essential tremor, chronic diabetic foot ulcer (right second toe) who presents with abdominal pain, fever, and 2 falls.   Clinical Impression   Pt was seen for OT evaluation this date. Prior to hospital admission, pt was ambulating only intermittently with her RW in the home since past admission ~49mo ago. She required some assist for LB ADL from her spouse at baseline and spouse does majority of housekeeping tasks and driving. Home layout same as previous admission (see details below). Pt presents to acute OT demonstrating impaired ADL performance and functional mobility 2/2 mild dizziness that resolved with time, back pain, decreased strength, and balance (See OT problem list for additional functional deficits). Pt currently requires MIN-MOD A for bed mobility, MIN A for ADL transfers with RW, and MAX A for standing pericare. Pt would benefit from skilled OT services to address noted impairments and functional limitations (see below for any additional details) in order to maximize safety and independence while minimizing falls risk and caregiver burden.    If plan is discharge home, recommend the following: A little help with walking and/or transfers;A lot of help with bathing/dressing/bathroom;Assistance with cooking/housework;Assist for transportation;Help with stairs or ramp for entrance    Functional Status Assessment  Patient has had a recent decline in their functional status and demonstrates the ability to make significant improvements in function in a reasonable and predictable amount of time.  Equipment Recommendations  None recommended by OT    Recommendations for Other  Services       Precautions / Restrictions Precautions Precautions: Fall Restrictions Weight Bearing Restrictions: No      Mobility Bed Mobility Overal bed mobility: Needs Assistance Bed Mobility: Supine to Sit, Sit to Supine     Supine to sit: Min assist, HOB elevated, Used rails Sit to supine: Mod assist   General bed mobility comments: MOD A for BLE mgt back to bed (pt's height making it difficult to get back into the elevated stretcher)    Transfers Overall transfer level: Needs assistance Equipment used: 1 person hand held assist, Rolling walker (2 wheels) Transfers: Sit to/from Stand, Bed to chair/wheelchair/BSC Sit to Stand: Min assist, From elevated surface     Step pivot transfers: Min assist, From elevated surface     General transfer comment: HHA for initial STS and SPT to [facility] with VC for sequencing, hand placement. RW for SPT from [facility] <> ED stretcher      Balance Overall balance assessment: Needs assistance Sitting-balance support: Single extremity supported, Feet supported Sitting balance-Leahy Scale: Fair     Standing balance support: Single extremity supported, During functional activity, Reliant on assistive device for balance, Bilateral upper extremity supported Standing balance-Leahy Scale: Fair Standing balance comment: fair- requires UE support and CGA- min assist                           ADL either performed or assessed with clinical judgement   ADL Overall ADL's : Needs assistance/impaired     Grooming: Sitting;Set up;Wash/dry face       Lower Body Bathing: Sitting/lateral leans;Minimal assistance           Toilet Transfer: BSC/3in1;Rolling walker (2 wheels);Minimal assistance   Toileting-  Clothing Manipulation and Hygiene: Sit to/from stand;Maximal assistance Toileting - Clothing Manipulation Details (indicate cue type and reason): unable to reach to complete pericare after BM, pt notes she typically uses a lateral lean  but was unable to due to the [facility] shape     Functional mobility during ADLs: Minimal assistance;Rolling walker (2 wheels)       Vision         Perception         Praxis         Pertinent Vitals/Pain Pain Assessment Pain Assessment: Faces Faces Pain Scale: Hurts even more Pain Location: low back Pain Descriptors / Indicators: Aching Pain Intervention(s): Monitored during session, Repositioned     Extremity/Trunk Assessment Upper Extremity Assessment Upper Extremity Assessment: Generalized weakness   Lower Extremity Assessment Lower Extremity Assessment: Generalized weakness       Communication Communication Communication: No apparent difficulties   Cognition Arousal: Alert Behavior During Therapy: WFL for tasks assessed/performed Overall Cognitive Status: Within Functional Limits for tasks assessed                                       General Comments  initial supine BP 73/60 (RN notes likely not accurate given all other recent BPs), sitting 118/84, initial dizziness from sup>sit resolved with time    Exercises Other Exercises Other Exercises: Pt/spouse educated in positional strategies to support BM when feeling constipated.   Shoulder Instructions      Home Living Family/patient expects to be discharged to:: Private residence Living Arrangements: Spouse/significant other Available Help at Discharge: Family;Available 24 hours/day Type of Home: House Home Access: Stairs to enter [employer] of Steps: 1 Entrance Stairs-Rails: None Home Layout: One level     Bathroom Shower/Tub: Chief Strategy Officer: Standard     Home Equipment: Cane - single point;Grab bars - tub/shower;BSC/3in1;Rolling Environmental consultant (2 wheels)   Additional Comments: son nearby available PRN, husband at home 24/7 but has early dementia      Prior Functioning/Environment Prior Level of Function : Needs assist             Mobility Comments:  Was using RW PRN until Sunday am when her R knee collapsed and she couldn't get up. Went to [employer] Saturday morning and held onto the grocery cart. ADLs Comments: husband assists with LB dressing at baseline 2/2 pt being unable to reach feet. Pt sponge bathes every day and only gets into tub shower once per week 2/2 mobility deficits. Pt and husband are retired. Pt enjoys cooking. Husband is assisting with most housework and driving. Patient is nervous about husband being home by himself or navigating in the community alone. Pt sleeps in a recliner 2/2 back pain.        OT Problem List: Decreased strength;Pain;Impaired balance (sitting and/or standing);Decreased knowledge of use of DME or AE;Obesity      OT Treatment/Interventions: Self-care/ADL training;Therapeutic exercise;Therapeutic activities;DME and/or AE instruction;Patient/family education;Balance training    OT Goals(Current goals can be found in the care plan section) Acute Rehab OT Goals Patient Stated Goal: feel better and go home OT Goal Formulation: With patient/family Time For Goal Achievement: 03/10/23 Potential to Achieve Goals: Good ADL Goals Pt Will Perform Lower Body Dressing: with caregiver independent in assisting;sit to/from stand Pt Will Transfer to Toilet: ambulating;with modified independence;bedside commode;regular height toilet (LRAD) Pt Will Perform Toileting - Clothing Manipulation and hygiene: with  modified independence Additional ADL Goal #1: Pt will verbalize plan to implement at least 2 learned falls prevention strategies to minimize risk.  OT Frequency: Min 1X/week    Co-evaluation              AM-PAC OT "6 Clicks" Daily Activity     Outcome Measure Help from another person eating meals?: None Help from another person taking care of personal grooming?: None Help from another person toileting, which includes using toliet, bedpan, or urinal?: A Lot Help from another person bathing (including  washing, rinsing, drying)?: A Lot Help from another person to put on and taking off regular upper body clothing?: None Help from another person to put on and taking off regular lower body clothing?: A Lot 6 Click Score: 18   End of Session Equipment Utilized During Treatment: Gait belt;Rolling walker (2 wheels) Nurse Communication: Mobility status;Other (comment) (BM, urge to urinate)  Activity Tolerance: Patient tolerated treatment well Patient left: in bed;with call bell/phone within reach;with nursing/sitter in room;with family/visitor present  OT Visit Diagnosis: Muscle weakness (generalized) (M62.81);History of falling (Z91.81);Unsteadiness on feet (R26.81)                Time: 9798-9211 OT Time Calculation (min): 51 min Charges:  OT General Charges $OT Visit: 1 Visit OT Evaluation $OT Eval Moderate Complexity: 1 Mod OT Treatments $Self Care/Home Management : 23-37 mins  Arman Filter., MPH, MS, OTR/L ascom [phone] 02/24/23, 10:09 AM

## 2023-02-24 NOTE — Progress Notes (Signed)
PHARMACY - PHYSICIAN COMMUNICATION CRITICAL VALUE ALERT - BLOOD CULTURE IDENTIFICATION (BCID)  Kara [name] is an 77 y.o. female who presented to [facility] on 02/23/2023 with a chief complaint of abdominal pain, fever, fall. Patient admitted for sepsis due to possible fistula of sigmoid colon related-infection or diverticulitis.   Assessment: Bcx 11/3: 3/4 GPCs; BCID Strep species - S. Agalactiae - no resistance.   Name of physician (or Provider) Contacted:  Dr. Fabienne Bruns  Current antibiotics:  Zosyn 3.375g Q8H   Changes to prescribed antibiotics recommended:  Patient is on recommended antibiotics - No changes needed  Results for orders placed or performed during the hospital encounter of 02/23/23  Blood Culture ID Panel (Reflexed) (Collected: 02/23/2023  4:00 PM)  Result Value Ref Range   Enterococcus faecalis NOT DETECTED NOT DETECTED   Enterococcus Faecium NOT DETECTED NOT DETECTED   Listeria monocytogenes NOT DETECTED NOT DETECTED   Staphylococcus species NOT DETECTED NOT DETECTED   Staphylococcus aureus (BCID) NOT DETECTED NOT DETECTED   Staphylococcus epidermidis NOT DETECTED NOT DETECTED   Staphylococcus lugdunensis NOT DETECTED NOT DETECTED   Streptococcus species DETECTED (A) NOT DETECTED   Streptococcus agalactiae DETECTED (A) NOT DETECTED   Streptococcus pneumoniae NOT DETECTED NOT DETECTED   Streptococcus pyogenes NOT DETECTED NOT DETECTED   A.calcoaceticus-baumannii NOT DETECTED NOT DETECTED   Bacteroides fragilis NOT DETECTED NOT DETECTED   Enterobacterales NOT DETECTED NOT DETECTED   Enterobacter cloacae complex NOT DETECTED NOT DETECTED   Escherichia coli NOT DETECTED NOT DETECTED   Klebsiella aerogenes NOT DETECTED NOT DETECTED   Klebsiella oxytoca NOT DETECTED NOT DETECTED   Klebsiella pneumoniae NOT DETECTED NOT DETECTED   Proteus species NOT DETECTED NOT DETECTED   Salmonella species NOT DETECTED NOT DETECTED   Serratia marcescens NOT DETECTED NOT  DETECTED   Haemophilus influenzae NOT DETECTED NOT DETECTED   Neisseria meningitidis NOT DETECTED NOT DETECTED   Pseudomonas aeruginosa NOT DETECTED NOT DETECTED   Stenotrophomonas maltophilia NOT DETECTED NOT DETECTED   Candida albicans NOT DETECTED NOT DETECTED   Candida auris NOT DETECTED NOT DETECTED   Candida glabrata NOT DETECTED NOT DETECTED   Candida krusei NOT DETECTED NOT DETECTED   Candida parapsilosis NOT DETECTED NOT DETECTED   Candida tropicalis NOT DETECTED NOT DETECTED   Cryptococcus neoformans/gattii NOT DETECTED NOT DETECTED    Effie Shy, PharmD Pharmacy Resident  02/24/2023 8:29 AM

## 2023-02-25 ENCOUNTER — Ambulatory Visit: Payer: Medicare HMO | Admitting: Podiatry

## 2023-02-25 ENCOUNTER — Inpatient Hospital Stay: Payer: Medicare HMO

## 2023-02-25 DIAGNOSIS — A408 Other streptococcal sepsis: Secondary | ICD-10-CM | POA: Diagnosis not present

## 2023-02-25 DIAGNOSIS — R7881 Bacteremia: Secondary | ICD-10-CM | POA: Diagnosis not present

## 2023-02-25 DIAGNOSIS — R6520 Severe sepsis without septic shock: Secondary | ICD-10-CM | POA: Diagnosis not present

## 2023-02-25 DIAGNOSIS — M869 Osteomyelitis, unspecified: Secondary | ICD-10-CM | POA: Diagnosis present

## 2023-02-25 DIAGNOSIS — S90122A Contusion of left lesser toe(s) without damage to nail, initial encounter: Secondary | ICD-10-CM | POA: Diagnosis not present

## 2023-02-25 LAB — CBC
HCT: 27.9 % — ABNORMAL LOW (ref 36.0–46.0)
Hemoglobin: 9 g/dL — ABNORMAL LOW (ref 12.0–15.0)
MCH: 27.6 pg (ref 26.0–34.0)
MCHC: 32.3 g/dL (ref 30.0–36.0)
MCV: 85.6 fL (ref 80.0–100.0)
Platelets: 106 10*3/uL — ABNORMAL LOW (ref 150–400)
RBC: 3.26 MIL/uL — ABNORMAL LOW (ref 3.87–5.11)
RDW: 17.6 % — ABNORMAL HIGH (ref 11.5–15.5)
WBC: 10 10*3/uL (ref 4.0–10.5)
nRBC: 0 % (ref 0.0–0.2)

## 2023-02-25 LAB — URINE CULTURE
Culture: NO GROWTH
Special Requests: NORMAL

## 2023-02-25 LAB — GLUCOSE, CAPILLARY
Glucose-Capillary: 116 mg/dL — ABNORMAL HIGH (ref 70–99)
Glucose-Capillary: 122 mg/dL — ABNORMAL HIGH (ref 70–99)
Glucose-Capillary: 150 mg/dL — ABNORMAL HIGH (ref 70–99)
Glucose-Capillary: 136 mg/dL — ABNORMAL HIGH (ref 70–99)
Glucose-Capillary: 141 mg/dL — ABNORMAL HIGH (ref 70–99)

## 2023-02-25 LAB — BASIC METABOLIC PANEL
Anion gap: 10 (ref 5–15)
BUN: 33 mg/dL — ABNORMAL HIGH (ref 8–23)
CO2: 22 mmol/L (ref 22–32)
Calcium: 7.8 mg/dL — ABNORMAL LOW (ref 8.9–10.3)
Chloride: 107 mmol/L (ref 98–111)
Creatinine, Ser: 1.87 mg/dL — ABNORMAL HIGH (ref 0.44–1.00)
GFR, Estimated: 28 mL/min — ABNORMAL LOW (ref >60)
Glucose, Bld: 141 mg/dL — ABNORMAL HIGH (ref 70–99)
Potassium: 3.2 mmol/L — ABNORMAL LOW (ref 3.5–5.1)
Sodium: 139 mmol/L (ref 135–145)

## 2023-02-25 MED ORDER — LOPERAMIDE HCL 2 MG PO CAPS
2.0000 mg | ORAL_CAPSULE | Freq: Once | ORAL | Status: AC
  Administered 2023-02-25: 2 mg via ORAL
  Filled 2023-02-25: qty 1

## 2023-02-25 MED ORDER — POTASSIUM CHLORIDE CRYS ER 20 MEQ PO TBCR
40.0000 meq | EXTENDED_RELEASE_TABLET | Freq: Once | ORAL | Status: AC
  Administered 2023-02-25: 40 meq via ORAL
  Filled 2023-02-25: qty 2

## 2023-02-25 NOTE — Evaluation (Signed)
Physical Therapy Evaluation Patient Details Name: Kara [name] MRN: [id number] DOB: Jun 10, 1945 Today's Date: 02/25/2023  History of Present Illness  77 y.o. female with medical history significant of HTN, HDL, DM, diabetic neuropathy, dCHF, CKD-4, anemia, kidney stone, chronic back pain, right breast cancer-DCIS, essential tremor, chronic diabetic foot ulcer (right second toe) who presents with abdominal pain, fever, and 2 falls.    Clinical Impression  Pt received in Semi-Fowler's position and agreeable to therapy following maximal encouragement.  Pt utilized hand rails heavily to come upright and seems to be out of breath upon sitting EOB.  Pt regained breathing with pursed lip method before attempting to stand.  Pt able to stand and perform mobility with the use of the RW well.  Pt performed well with the surgical shoe donned to alleviate the pressure on the R forefoot/toe region.  Pt would likely have better gait pattern with shoe donned on the L LE, however unavailable at this time.  Pt ambulated to end of nursing station and returned back to room.  Pt performed well with all tasks and was assisted to the bed.  Pt left with all needs met and call bell within reach.      If plan is discharge home, recommend the following: A little help with walking and/or transfers;A little help with bathing/dressing/bathroom;Assistance with cooking/housework;Assistance with feeding;Help with stairs or ramp for entrance;Assist for transportation   Can travel by private vehicle   Yes    Equipment Recommendations None recommended by PT  Recommendations for Other Services       Functional Status Assessment       Precautions / Restrictions Precautions Precautions: Fall Restrictions Weight Bearing Restrictions: Yes (WBAT in surgical shoe; pre and post surgery)      Mobility  Bed Mobility Overal bed mobility: Needs Assistance Bed Mobility: Supine to Sit, Sit to Supine     Supine to sit: HOB  elevated, Used rails, Supervision Sit to supine: Mod assist   General bed mobility comments: MOD A for BLE mgt back to bed    Transfers Overall transfer level: Needs assistance Equipment used: Rolling walker (2 wheels) Transfers: Sit to/from Stand Sit to Stand: Contact guard assist                Ambulation/Gait Ambulation/Gait assistance: Contact guard assist Gait Distance (Feet): 90 Feet Assistive device: Rolling walker (2 wheels) Gait Pattern/deviations: Step-to pattern, Decreased step length - left, Decreased stance time - right Gait velocity: decreased     General Gait Details: decreased stance time on the R LE due to surgical shoe.  Stairs            Wheelchair Mobility     Tilt Bed    Modified Rankin (Stroke Patients Only)       Balance Overall balance assessment: Needs assistance Sitting-balance support: Single extremity supported, Feet supported Sitting balance-Leahy Scale: Fair     Standing balance support: Single extremity supported, During functional activity, Reliant on assistive device for balance, Bilateral upper extremity supported Standing balance-Leahy Scale: Fair Standing balance comment: fair- requires UE support and CGA- min assist                             Pertinent Vitals/Pain Pain Assessment Pain Assessment: Faces Faces Pain Scale: Hurts little more Pain Location: R hip Pain Descriptors / Indicators: Aching Pain Intervention(s): Limited activity within patient's tolerance, Monitored during session, Premedicated before session  Home Living Family/patient expects to be discharged to:: Private residence Living Arrangements: Spouse/significant other Available Help at Discharge: Family;Available 24 hours/day Type of Home: House Home Access: Stairs to enter Entrance Stairs-Rails: None Entrance Stairs-Number of Steps: 1   Home Layout: One level Home Equipment: Cane - single point;Grab bars -  tub/shower;BSC/3in1;Rolling Walker (2 wheels) Additional Comments: son nearby available PRN, husband at home 24/7 but has early dementia    Prior Function Prior Level of Function : Needs assist             Mobility Comments: Was using RW PRN until Sunday am when her R knee collapsed and she couldn't get up. Went to [employer] Saturday morning and held onto the grocery cart. ADLs Comments: husband assists with LB dressing at baseline 2/2 pt being unable to reach feet. Pt sponge bathes every day and only gets into tub shower once per week 2/2 mobility deficits. Pt and husband are retired. Pt enjoys cooking. Husband is assisting with most housework and driving. Patient is nervous about husband being home by himself or navigating in the community alone. Pt sleeps in a recliner 2/2 back pain.     Extremity/Trunk Assessment                Communication   Communication Communication: No apparent difficulties  Cognition Arousal: Alert Behavior During Therapy: WFL for tasks assessed/performed Overall Cognitive Status: Within Functional Limits for tasks assessed                                          General Comments      Exercises     Assessment/Plan    PT Assessment    PT Problem List         PT Treatment Interventions      PT Goals (Current goals can be found in the Care Plan section)  Acute Rehab PT Goals Patient Stated Goal: to get back home to husband. PT Goal Formulation: With patient Time For Goal Achievement: 03/11/23 Potential to Achieve Goals: Good    Frequency Min 1X/week     Co-evaluation               AM-PAC PT "6 Clicks" Mobility  Outcome Measure Help needed turning from your back to your side while in a flat bed without using bedrails?: A Little Help needed moving from lying on your back to sitting on the side of a flat bed without using bedrails?: A Little Help needed moving to and from a bed to a chair (including a  wheelchair)?: A Little Help needed standing up from a chair using your arms (e.g., wheelchair or bedside chair)?: A Little Help needed to walk in hospital room?: A Little Help needed climbing 3-5 steps with a railing? : A Lot 6 Click Score: 17    End of Session Equipment Utilized During Treatment: Gait belt Activity Tolerance: Patient limited by fatigue;Patient limited by pain Patient left: in bed;with call bell/phone within reach;with bed alarm set Nurse Communication: Mobility status PT Visit Diagnosis: Unsteadiness on feet (R26.81);Other abnormalities of gait and mobility (R26.89);Muscle weakness (generalized) (M62.81);History of falling (Z91.81);Difficulty in walking, not elsewhere classified (R26.2)    Time: 9528-4132 PT Time Calculation (min) (ACUTE ONLY): 11 min   Charges:   PT Evaluation $PT Eval Low Complexity: 1 Low   PT General Charges $$ ACUTE PT VISIT: 1 Visit  Nolon Bussing, PT, DPT Physical Therapist - [facility]  02/25/23, 4:59 PM

## 2023-02-25 NOTE — Progress Notes (Signed)
PT Cancellation Note  Patient Details Name: Kara [name] MRN: [id number] DOB: [birth year]   Cancelled Treatment:    Reason Eval/Treat Not Completed: Patient at procedure or test/unavailable.  Chart reviewed and attempted to see pt.  Upon entering and pt agreeing to work with therapy, transport arrived to take pt to MRI.  Will re-attempt at later date/time as medically appropriate.  Nolon Bussing, PT, DPT Physical Therapist - [facility]  02/25/23, 12:33 PM

## 2023-02-25 NOTE — Plan of Care (Signed)

## 2023-02-25 NOTE — Plan of Care (Signed)

## 2023-02-25 NOTE — Progress Notes (Addendum)
PROGRESS NOTE   HPI was taken from Dr. Clyde Lundborg: Kara [name] is a 77 y.o. female with medical history significant of HTN, HDL, DM, diabetic neuropathy, dCHF, CKD-4, anemia, kidney stone, chronic back pain, right breast cancer-DCIS, essential tremor, chronic diabetic foot ulcer (right second toe) who presents with abdominal pain, fever, fall.   Patient states that she has a remote history of abdominal surgery due to abdominal abscess, but does not remember what caused her abdominal abscess.  She has chronic lower abdominal pain for several months, which has been intermittent, sometimes becomes severe recently.  Currently her lower abdominal pain is mild, aching, nonradiating.  Not associated with nausea, vomiting or diarrhea.  Patient has fever and chills.  Patient denies chest pain, cough, SOB.  No symptoms of UTI. Pt has a chronic right second toe ulcer and following up with Dr. Allena Katz of podiatry.  Patient has generalized weakness.  She states that she has had 2 falls from low height. Pt fell off the toilet last night and slid out of a chair today.  Denies loss of consciousness or hitting her head.  She has pain in the low back and right hip and right knee.    Data reviewed independently and ED Course: pt was found to have WBC 10.6, lactic acid 1.9, negative urinalysis, negative COVID PCR, worsening renal function with creatinine 1.91, BUN 37, GFR 27 (recent baseline creatinine 1.35), temperature 103.2, blood pressure 111/89, heart rate 138 --> 95, RR 36 --> 23, oxygen sat 95% on room air.  Chest x-ray negative.  CT head negative.  X-ray of right and lower back and right hip/pelvis negative for acute injury. Patient is admitted to telemetry bed as inpatient.   CT scan of chest/abdomen/pelvis: 1. No acute intrathoracic or intra-abdominal pathology identified. 2. Mild coronary artery calcification. 3. Morphologic changes in keeping with pulmonary arterial hypertension. 4. Mild hepatomegaly and hepatic  steatosis. 5. Severe sigmoid diverticulosis. 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 An infiltrative malignancy is less likely, but not completely excluded. Correlation with colonoscopy may be helpful for further evaluation.    Kara [name]  UEA:[id number] DOB: 1945-05-01 DOA: 02/23/2023 PCP: [name], NP    Assessment & Plan:   Principal Problem:   Sepsis [facility]) Active Problems:   Fistula of sigmoid colon   Fall at home, initial encounter   Type II diabetes mellitus with renal manifestations (HCC)   HLD (hyperlipidemia)   Chronic diastolic CHF (congestive heart failure) (HCC)   CKD (chronic kidney disease), stage IV (HCC)   Iron deficiency anemia   Diabetic foot ulcer_ right second toe   Essential hypertension   Chronic bilateral low back pain with left-sided sciatica  Assessment and Plan: Sepsis: met criteria w/ leukocytosis, tachycardia, tachypnea, fever and bacteremia likely secondary to right 2nd toe osteomyelitis. Continue on IV zosyn.  Ultracet, tylenol prn for pain. Zofran prn nausea/vomiting. Continue on IVFs  Bacteremia: blood cxs growing streg agalactiae, sens pend. Likely secondary to 2nd toe osteomyelitis. Continue on IV zosyn. Repeat blood cxs tomorrow   Likely right 2nd toe osteomyelitis: as per XR. Continue on IV zosyn. MRI right foot pending. Podiatry following and recs apprec. Likely etiology of bacteremia.    Fall at home, initial encounter: CT head negative.  No  acute injury on imaging of x-ray of right knee and right hip/pelvis. OT recs SNF. PT consulted    DM2: HbA1c 8.3, poorly controlled. Continue on glargine, SSI w/ accuchecks   Hypokalemia: potassium given     HLD: continue on statin    Chronic diastolic CHF: echo on 12/25/2021 showd EF 60-85% with grade I diastolic dysfunction. Trace leg edema, but no pulm edema chest x-ray, BNP normal 63.  CHF is compensated.Holding lasix. Monitor I/Os   CKDIV: baseline Cr 1.35-1.6. Cr is trending up slightly from day prior. Avoid nephrotoxic meds    IDA: continue on iron supplement    Chronic bilateral low back pain with left-sided sciatica: continue on home dose of ultracet prn    Thrombocytopenia: etiology unclear. Labile   Obesity: BMI 36.3. Would benefit from weight loss    Diverticulosis: Severe sigmoid diverticulosis. 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 An infiltrative malignancy is less likely, but not completely excluded. Correlation with colonoscopy may be helpful for further evaluation. Colonoscopy can be done as an outpatient    DVT prophylaxis: heparin SQ Code Status: full  Family Communication: discussed pt's care w/ pt's family at bedside and answered their questions  Disposition Plan: depends on PT/OT recs   Level of care: Telemetry Medical Status is: Inpatient Remains inpatient appropriate because: severity of illness    Consultants:  Podiatry   Procedures:  Antimicrobials: zosyn    Subjective: Pt c/o malaise   Objective: Vitals:   02/25/23 0249 02/25/23 0250 02/25/23 0252 02/25/23 0722  BP:  (!) 129/44 (!) 132/56 (!) 141/65  Pulse:  82 85 87  Resp:  19 18 17   Temp:  97.6 F (36.4 C) 98.2 F (36.8 C) 98 F (36.7 C)  TempSrc:  Tympanic    SpO2:   100% 96%  Weight:  84.4 kg    Height: 5' (1.524 m) 5' (1.524 m)     No intake or output data in the 24 hours ending 02/25/23  0819  Filed Weights   02/25/23 0250  Weight: 84.4 kg    Examination:  General exam: Appears uncomfortable  Respiratory system: clear breath sounds b/l  Cardiovascular system: S1/S2+. No rubs or clicks  Gastrointestinal system: abd is soft, NT, obese & hypoactive bowel sounds  Central nervous system: alert & oriented. Moves all extremities  Psychiatry: Judgement and insight appears normal. Flat mood and affect    Data Reviewed: I have personally reviewed following labs and imaging studies  CBC: Recent Labs  Lab 02/23/23 1602 02/24/23 0541 02/25/23 0458  WBC 10.6* 11.5* 10.0  NEUTROABS 9.8*  --   --   HGB 10.5* 8.6* 9.0*  HCT 33.3* 27.6* 27.9*  MCV 86.9 89.0 85.6  PLT 134* 107* 106*   Basic Metabolic Panel: Recent Labs  Lab 02/23/23 1602 02/24/23 0541 02/25/23 0458  NA 134* 138 139  K 3.9 3.3* 3.2*  CL 96* 103 107  CO2 26 25 22   GLUCOSE 240* 218* 141*  BUN 37* 31* 33*  CREATININE 1.91* 1.84* 1.87*  CALCIUM 8.7* 7.9* 7.8*   GFR: Estimated Creatinine Clearance: 24.7 mL/min (A) (by C-G formula based on SCr of 1.87 mg/dL (H)). Liver Function Tests: Recent Labs  Lab 02/23/23 1602  AST 26  ALT 19  ALKPHOS 81  BILITOT 1.3*  PROT 7.8  ALBUMIN 3.2*   No results for input(s): "LIPASE", "AMYLASE" in the last 168 hours. No results for input(s): "AMMONIA" in the last 168 hours. Coagulation Profile: Recent Labs  Lab 02/23/23 1602  INR 1.2   Cardiac Enzymes: No results for input(s): "CKTOTAL", "CKMB", "CKMBINDEX", "TROPONINI" in the last 168 hours. BNP (last 3 results) No results for input(s): "PROBNP" in the last 8760 hours. HbA1C: No results for input(s): "HGBA1C" in the last 72 hours. CBG: Recent Labs  Lab 02/24/23 0718 02/24/23 1138 02/24/23 1618 02/25/23 0104 02/25/23 0803  GLUCAP 205* 172* 177* 116* 122*   Lipid Profile: No results for input(s): "CHOL", "HDL", "LDLCALC", "TRIG", "CHOLHDL", "LDLDIRECT" in the last 72 hours. Thyroid Function  Tests: No results for input(s): "TSH", "T4TOTAL", "FREET4", "T3FREE", "THYROIDAB" in the last 72 hours. Anemia Panel: No results for input(s): "VITAMINB12", "FOLATE", "FERRITIN", "TIBC", "IRON", "RETICCTPCT" in the last 72 hours. Sepsis Labs: Recent Labs  Lab 02/23/23 1600  PROCALCITON 0.79  LATICACIDVEN 1.9    Recent Results (from the past 240 hour(s))  Culture, blood (routine x 2)     Status: None (Preliminary result)   Collection Time: 02/23/23  4:00 PM   Specimen: BLOOD  Result Value Ref Range Status   Specimen Description   Final    BLOOD LEFT ANTECUBITAL Performed at [facility], [address], [place], [place] [address]    Special Requests   Final    BOTTLES DRAWN AEROBIC AND ANAEROBIC Blood Culture adequate volume Performed at [facility], [address] [place], [place] [address]    Culture  Setup Time   Final    GRAM POSITIVE COCCI IN BOTH AEROBIC AND ANAEROBIC BOTTLES CRITICAL VALUE NOTED.  VALUE IS CONSISTENT WITH PREVIOUSLY REPORTED AND CALLED VALUE. Performed at [facility], [address], [place], [place] [address]    Culture   Final    Romie Minus POSITIVE COCCI IDENTIFICATION TO FOLLOW Performed at [facility], [address], [place], [place] [address]    Report Status PENDING  Incomplete  Culture, blood (routine x 2)     Status: Abnormal (Preliminary result)   Collection Time: 02/23/23  4:00 PM   Specimen: BLOOD  Result Value Ref Range Status   Specimen Description   Final    BLOOD BLOOD LEFT FOREARM Performed at [facility], [address], [place], [place] [address]    Special Requests   Final    BOTTLES DRAWN AEROBIC AND ANAEROBIC Blood Culture adequate volume Performed at [facility], [address], [place], [place] [address]    Culture  Setup Time   Final    GRAM POSITIVE COCCI IN BOTH AEROBIC AND ANAEROBIC BOTTLES CRITICAL RESULT CALLED TO, READ BACK BY AND VERIFIED WITHDomingo Pulse [facility] 5284 02/24/23 HNM Performed at [facility], [address], [place], [place] [address]    Culture (A)  Final    GROUP B STREP(S.AGALACTIAE)ISOLATED SUSCEPTIBILITIES TO FOLLOW Performed at [facility], [address], [place], [place] [address]    Report Status PENDING  Incomplete  Blood Culture ID Panel (Reflexed)     Status: Abnormal   Collection Time: 02/23/23  4:00 PM  Result Value Ref Range Status   Enterococcus faecalis NOT DETECTED NOT DETECTED Final   Enterococcus Faecium NOT DETECTED NOT DETECTED Final   Listeria monocytogenes NOT DETECTED NOT DETECTED Final   Staphylococcus species NOT DETECTED NOT DETECTED Final   Staphylococcus  aureus (BCID) NOT DETECTED NOT DETECTED Final   Staphylococcus epidermidis NOT DETECTED NOT DETECTED Final   Staphylococcus lugdunensis NOT DETECTED NOT DETECTED Final   Streptococcus species DETECTED (A) NOT DETECTED Final    Comment: CRITICAL RESULT CALLED TO, READ BACK BY AND VERIFIED WITH: CAROLINE CHILDS PHARMD 0732 02/24/23 HNM    Streptococcus agalactiae DETECTED (A) NOT DETECTED Final    Comment: CRITICAL RESULT CALLED TO, READ BACK BY AND VERIFIED WITH: CAROLINE CHILDS PHARMD 0732 02/24/23 HNM    Streptococcus pneumoniae NOT DETECTED NOT DETECTED Final   Streptococcus pyogenes NOT DETECTED NOT DETECTED Final   A.calcoaceticus-baumannii NOT DETECTED NOT DETECTED Final   Bacteroides fragilis NOT DETECTED NOT DETECTED Final   Enterobacterales NOT DETECTED NOT DETECTED Final   Enterobacter cloacae complex NOT DETECTED NOT DETECTED Final   Escherichia coli NOT DETECTED NOT DETECTED Final   Klebsiella aerogenes NOT DETECTED NOT DETECTED Final   Klebsiella oxytoca NOT DETECTED NOT DETECTED Final   Klebsiella pneumoniae NOT DETECTED NOT DETECTED Final   Proteus species NOT DETECTED NOT DETECTED Final   Salmonella species NOT DETECTED NOT DETECTED Final   Serratia marcescens NOT DETECTED NOT DETECTED Final    Haemophilus influenzae NOT DETECTED NOT DETECTED Final   Neisseria meningitidis NOT DETECTED NOT DETECTED Final   Pseudomonas aeruginosa NOT DETECTED NOT DETECTED Final   Stenotrophomonas maltophilia NOT DETECTED NOT DETECTED Final   Candida albicans NOT DETECTED NOT DETECTED Final   Candida auris NOT DETECTED NOT DETECTED Final   Candida glabrata NOT DETECTED NOT DETECTED Final   Candida krusei NOT DETECTED NOT DETECTED Final   Candida parapsilosis NOT DETECTED NOT DETECTED Final   Candida tropicalis NOT DETECTED NOT DETECTED Final   Cryptococcus neoformans/gattii NOT DETECTED NOT DETECTED Final    Comment: Performed at [facility], [address] [place], [place] [address]  SARS Coronavirus 2 by RT PCR (hospital order, performed in [facility] hospital lab) *cepheid single result test* Anterior Nasal Swab     Status: None   Collection Time: 02/23/23  5:56 PM   Specimen: Anterior Nasal Swab  Result Value Ref Range Status   SARS Coronavirus 2 by RT PCR NEGATIVE NEGATIVE Final    Comment: (NOTE) SARS-CoV-2 target nucleic acids are NOT DETECTED.  The SARS-CoV-2 RNA is generally detectable in upper and lower respiratory specimens during the acute phase of infection. The lowest concentration of SARS-CoV-2 viral copies this assay can detect is 250 copies / mL. A negative result does not preclude SARS-CoV-2 infection and should not be used as the sole basis for treatment or other patient management decisions.  A negative result may occur with improper specimen collection / handling, submission of specimen other than nasopharyngeal swab, presence of viral mutation(s) within the areas targeted by this assay, and inadequate number of viral copies (<250 copies / mL). A negative result must be combined with clinical observations, patient history, and epidemiological information.  Fact Sheet for Patients:   RoadLapTop.co.za  Fact Sheet for Healthcare  Providers: http://kim-miller.com/  This test is not yet approved or  cleared by the [place] FDA and has been authorized for detection and/or diagnosis of SARS-CoV-2 by FDA under an Emergency Use Authorization (EUA).  This EUA will remain in effect (meaning this test can be used) for the duration of the COVID-19 declaration under Section 564(b)(1) of the Act, 21 U.S.C. section 360bbb-3(b)(1), unless the authorization is terminated or revoked sooner.  Performed at [facility], [address], [place], [place]  [address]   Respiratory (~20 pathogens) panel by PCR     Status: None   Collection Time: 02/23/23  9:42 PM   Specimen: Nasopharyngeal Swab; Respiratory  Result Value Ref Range Status   Adenovirus NOT DETECTED NOT DETECTED Final   Coronavirus 229E NOT DETECTED NOT DETECTED Final    Comment: (NOTE) The Coronavirus on the Respiratory Panel, DOES NOT test for the novel  Coronavirus (2019 nCoV)    Coronavirus HKU1 NOT DETECTED NOT DETECTED Final   Coronavirus NL63 NOT DETECTED NOT DETECTED Final   Coronavirus OC43 NOT DETECTED NOT DETECTED Final   Metapneumovirus NOT DETECTED NOT DETECTED Final   Rhinovirus / Enterovirus NOT DETECTED NOT DETECTED Final   Influenza A NOT DETECTED NOT DETECTED Final   Influenza B NOT DETECTED NOT DETECTED Final   Parainfluenza Virus 1 NOT DETECTED NOT DETECTED Final   Parainfluenza Virus 2 NOT DETECTED NOT DETECTED Final   Parainfluenza Virus 3 NOT DETECTED NOT DETECTED Final   Parainfluenza Virus 4 NOT DETECTED NOT DETECTED Final   Respiratory Syncytial Virus NOT DETECTED NOT DETECTED Final   Bordetella pertussis NOT DETECTED NOT DETECTED Final   Bordetella Parapertussis NOT DETECTED NOT DETECTED Final   Chlamydophila pneumoniae NOT DETECTED NOT DETECTED Final   Mycoplasma pneumoniae NOT DETECTED NOT DETECTED Final    Comment: Performed at [facility], [address], [place], [place] [address]          Radiology Studies: DG Foot Complete Right  Result Date: 02/24/2023 CLINICAL DATA:  Right second toe ulcer.  Fall. EXAM: RIGHT FOOT COMPLETE - 3+ VIEW COMPARISON:  Right foot radiographs 02/11/2023 and 11/21/2021 FINDINGS: There is diffuse decreased bone mineralization. There is again mild-to-moderate distal second toe soft tissue swelling. There is soft tissue irregularity indicating an ulcer at the distal second toe. There is mild lucency at the distal dorsal aspect of the distal phalanx of the second toe, suspicious for acute osteomyelitis. Moderate to large plantar calcaneal heel spur. Mild-to-moderate dorsal navicular-cuneiform and second tarsometatarsal degenerative osteophytes on lateral view. Mild hallux valgus. Mild great toe metatarsophalangeal joint space narrowing and peripheral osteophytosis. No acute fracture or dislocation. IMPRESSION: 1. Second toe soft tissue swelling and distal ulcer. Mild lucency at the distal dorsal aspect of the distal phalanx of the second toe, suspicious for acute osteomyelitis. 2. Moderate to large plantar calcaneal heel spur. Electronically Signed   By: Neita Garnet M.D.   On: 02/24/2023 14:58   CT CHEST ABDOMEN PELVIS WO CONTRAST  Result Date: 02/23/2023 CLINICAL DATA:  Sepsis, fall, back pain EXAM: CT CHEST, ABDOMEN AND PELVIS WITHOUT CONTRAST TECHNIQUE: Multidetector CT imaging of the chest, abdomen and pelvis was performed following the standard protocol without IV contrast. RADIATION DOSE REDUCTION: This exam was performed according to the departmental dose-optimization program which includes automated exposure control, adjustment of the mA and/or kV according to patient size and/or use of iterative reconstruction technique. COMPARISON:  None Available. FINDINGS: CT CHEST FINDINGS Cardiovascular: Mild coronary artery calcification. Global cardiac size within normal limits. No pericardial effusion. Central pulmonary arteries are enlarged in keeping  with changes of pulmonary arterial hypertension. Moderate atherosclerotic calcification within the thoracic aorta. No aortic aneurysm. Mediastinum/Nodes: No enlarged mediastinal, hilar, or axillary lymph nodes. Thyroid gland, trachea, and esophagus demonstrate no significant findings. Lungs/Pleura: Clustered nodules within the a right lower lobe at axial image # 95/4 measuring less than 4 mm in diameter are nonspecific, but likely post infectious or post inflammatory in nature. Lungs are otherwise  clear. No pneumothorax or pleural effusion. Musculoskeletal: Osseous structures are age-appropriate. No acute bone abnormality. No lytic or blastic bone lesion. CT ABDOMEN PELVIS FINDINGS Hepatobiliary: Mild hepatomegaly and hepatic steatosis. No definite intrahepatic mass. No intra or extrahepatic biliary ductal dilation. Status post cholecystectomy. Pancreas: Unremarkable Spleen: Unremarkable Adrenals/Urinary Tract: Adrenal glands are unremarkable. Kidneys are normal, without renal calculi, focal lesion, or hydronephrosis. Bladder is unremarkable. Stomach/Bowel: There is severe diverticulosis involving a redundant sigmoid colon. There is focal mural thickening and pericolonic infiltration along the mid sigmoid colon where an extraluminal contained collection of stool demonstrating a relatively thick rind demonstrates communication between several loops of a sigmoid colon in keeping with a chronic colo-colonic fistula, likely the result of prior diverticulitis. This is best seen on coronal image # 49-63 and axial image # 93/2. An infiltrative malignancy is less likely, but not completely excluded. The stomach, small bowel, and large bowel are otherwise unremarkable. No evidence of obstruction. Appendix normal. No free intraperitoneal gas or fluid. Vascular/Lymphatic: Aortic atherosclerosis. No enlarged abdominal or pelvic lymph nodes. Reproductive: Uterus and bilateral adnexa are unremarkable. Other: Small multilobulated  fat containing umbilical hernia. Musculoskeletal: Postsurgical changes of L3-L5 lumbar fusion with instrumentation are seen within the lumbar spine. No acute bone abnormality. No lytic or blastic bone lesion. IMPRESSION: 1. No acute intrathoracic or intra-abdominal pathology identified. 2. Mild coronary artery calcification. 3. Morphologic changes in keeping with pulmonary arterial hypertension. 4. Mild hepatomegaly and hepatic steatosis. 5. Severe sigmoid diverticulosis. 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 An infiltrative malignancy is less likely, but not completely excluded. Correlation with colonoscopy may be helpful for further evaluation. Aortic Atherosclerosis (ICD10-I70.0). Electronically Signed   By: Helyn Numbers M.D.   On: 02/23/2023 19:07   DG Knee Complete 4 Views Right  Result Date: 02/23/2023 CLINICAL DATA:  Pain after fall EXAM: RIGHT KNEE - COMPLETE 4 VIEW COMPARISON:  None Available. FINDINGS: Osteopenia. Osteophyte formation seen along the medial compartment and patellofemoral joint. Grossly preserved joint spaces. No joint effusion on lateral view. IMPRESSION: Osteopenia.  Degenerative changes Electronically Signed   By: Karen Kays M.D.   On: 02/23/2023 17:23   DG Chest 1 View  Result Date: 02/23/2023 CLINICAL DATA:  Pain after fall.  Sepsis EXAM: CHEST  1 VIEW COMPARISON:  12/21/2021 and older. FINDINGS: The heart size and mediastinal contours are within normal limits. No consolidation, pneumothorax or effusion. No edema. Stable enlargement of the pulmonary artery centrally. The visualized skeletal structures are unremarkable. Overlapping cardiac leads. IMPRESSION: No acute cardiopulmonary  disease. Electronically Signed   By: Karen Kays M.D.   On: 02/23/2023 17:22   DG Hip Unilat W or Wo Pelvis 2-3 Views Right  Result Date: 02/23/2023 CLINICAL DATA:  Pain.  Fall EXAM: DG HIP (WITH OR WITHOUT PELVIS) 3V RIGHT COMPARISON:  None Available. FINDINGS: Osteopenia. No fracture or dislocation. Preserved joint spaces of the hips. There is slight sclerosis along the sacroiliac joints. Fixation hardware seen along the lower lumbar spine at the edge of the imaging field. With this level of osteopenia subtle nondisplaced injury is difficult to completely exclude and if there is further concern additional workup with CT or MRI as clinically appropriate. IMPRESSION: Osteopenia.  No acute osseous abnormality. Electronically Signed   By: Karen Kays  M.D.   On: 02/23/2023 17:21   CT Head Wo Contrast  Result Date: 02/23/2023 CLINICAL DATA:  Fall EXAM: CT HEAD WITHOUT CONTRAST TECHNIQUE: Contiguous axial images were obtained from the base of the skull through the vertex without intravenous contrast. RADIATION DOSE REDUCTION: This exam was performed according to the departmental dose-optimization program which includes automated exposure control, adjustment of the mA and/or kV according to patient size and/or use of iterative reconstruction technique. COMPARISON:  None Available. FINDINGS: Brain: There is no acute intracranial hemorrhage, extra-axial fluid collection, or acute infarct. Parenchymal volume is within expected limits for age. The ventricles are normal in size. Gray-white differentiation is preserved. The pituitary and suprasellar region are normal. There is no mass lesion. There is no mass effect or midline shift. Vascular: No hyperdense vessel or unexpected calcification. Skull: Normal. Negative for fracture or focal lesion. Sinuses/Orbits: The imaged paranasal sinuses are clear. Bilateral lens implants are in place. The globes and orbits are otherwise unremarkable. Other: The mastoid air cells  and middle ear cavities are clear. IMPRESSION: No acute intracranial pathology. Electronically Signed   By: Lesia Hausen M.D.   On: 02/23/2023 17:11        Scheduled Meds:  acetaminophen  1,000 mg Oral Once   ferrous gluconate  324 mg Oral Daily   heparin  5,000 Units Subcutaneous Q8H   insulin aspart  0-5 Units Subcutaneous QHS   insulin aspart  0-9 Units Subcutaneous TID WC   insulin glargine-yfgn  20 Units Subcutaneous QHS   pantoprazole  40 mg Oral Daily   pregabalin  100 mg Oral BID   rosuvastatin  20 mg Oral Daily   Continuous Infusions:  sodium chloride 75 mL/hr at 02/23/23 2143   piperacillin-tazobactam (ZOSYN)  IV 3.375 g (02/25/23 0601)     LOS: 2 days       Charise Killian, MD [facility] Hospitalists Pager [phone]  If 7PM-7AM, please contact night-coverage [email or web address] 02/25/2023, 8:19 AM

## 2023-02-25 NOTE — Consult Note (Signed)
.    Subjective:  Patient ID: Kara [name], female    DOB: Sep 30, 1945,  MRN: [id number]  A 77 y.o. female medical history significant of HTN, HDL, DM, diabetic neuropathy, dCHF, CKD-4, anemia, kidney stone, chronic back pain, right breast cancer-DCIS, essential tremor, chronic diabetic foot ulcer (right second toe) presents with right second digit concern for osteomyelitis as well as toe fracture to the left fourth and fifth toe.  Patient has ecchymosis and bruising patient fell at home that led to her being here. Objective:   Vitals:   02/25/23 0252 02/25/23 0722  BP: (!) 132/56 (!) 141/65  Pulse: 85 87  Resp: 18 17  Temp: 98.2 F (36.8 C) 98 F (36.7 C)  SpO2: 100% 96%   General AA&O x3. Normal mood and affect.  Vascular Dorsalis pedis and posterior tibial pulses 2/4 bilat. Brisk capillary refill to all digits. Pedal hair present.  Neurologic Epicritic sensation grossly intact.  Dermatologic Right second digit ulceration probing down to deep tissue.  No purulent drainage noted mild erythema.  Left ecchymosis bruising noted to left fourth and fifth digit.  No open wounds or lesion noted  Orthopedic: MMT 5/5 in dorsiflexion, plantarflexion, inversion, and eversion. Normal joint ROM without pain or crepitus.   MPRESSION: 1. Second toe soft tissue swelling and distal ulcer. Mild lucency at the distal dorsal aspect of the distal phalanx of the second toe, suspicious for acute osteomyelitis. 2. Moderate to large plantar calcaneal heel spur.  Assessment & Plan:  Patient was evaluated and treated and all questions answered.  Right second digit ulceration concern for underlying osteomyelitis -All questions and concerns were discussed with the patient in extensive detail -MRI was ordered to assess for the extent of osteomyelitis.  Order was placed -Patient is scheduled to go to the operating room with Dr. Abbott Pao tomorrow for right second digit amputation -N.p.o. after  midnight -Weightbearing as tolerated surgical shoe -Betadine wet-to-dry dressing  Left fourth and fifth digit fracture possible -All questions and concerns were discussed -Patient will need a surgical shoe and can be weightbearing as tolerated to the left side. -X-ray was ordered to the left side.  Even if there is toe fractures present this would likely be treated with buddy splinting and conservative care. -She can be weightbearing as tolerated in surgical shoe to the left side as well.  Candelaria Stagers, DPM  Accessible via secure chat for questions or concerns.

## 2023-02-26 ENCOUNTER — Other Ambulatory Visit: Payer: Self-pay

## 2023-02-26 ENCOUNTER — Encounter
Admission: EM | Disposition: A | Discharge status: Home Health | Payer: Self-pay | Source: Home / Self Care | Attending: Student

## 2023-02-26 ENCOUNTER — Inpatient Hospital Stay: Payer: Medicare HMO | Admitting: General Practice

## 2023-02-26 DIAGNOSIS — R6520 Severe sepsis without septic shock: Secondary | ICD-10-CM | POA: Diagnosis not present

## 2023-02-26 DIAGNOSIS — A408 Other streptococcal sepsis: Secondary | ICD-10-CM | POA: Diagnosis not present

## 2023-02-26 DIAGNOSIS — M86171 Other acute osteomyelitis, right ankle and foot: Secondary | ICD-10-CM

## 2023-02-26 HISTORY — PX: AMPUTATION TOE: SHX6595

## 2023-02-26 LAB — GLUCOSE, CAPILLARY
Glucose-Capillary: 79 mg/dL (ref 70–99)
Glucose-Capillary: 119 mg/dL — ABNORMAL HIGH (ref 70–99)
Glucose-Capillary: 96 mg/dL (ref 70–99)
Glucose-Capillary: 110 mg/dL — ABNORMAL HIGH (ref 70–99)
Glucose-Capillary: 105 mg/dL — ABNORMAL HIGH (ref 70–99)
Glucose-Capillary: 192 mg/dL — ABNORMAL HIGH (ref 70–99)

## 2023-02-26 LAB — PHOSPHORUS: Phosphorus: 2.4 mg/dL — ABNORMAL LOW (ref 2.5–4.6)

## 2023-02-26 LAB — BASIC METABOLIC PANEL
Anion gap: 8 (ref 5–15)
BUN: 29 mg/dL — ABNORMAL HIGH (ref 8–23)
CO2: 24 mmol/L (ref 22–32)
Calcium: 8.3 mg/dL — ABNORMAL LOW (ref 8.9–10.3)
Chloride: 113 mmol/L — ABNORMAL HIGH (ref 98–111)
Creatinine, Ser: 1.74 mg/dL — ABNORMAL HIGH (ref 0.44–1.00)
GFR, Estimated: 30 mL/min — ABNORMAL LOW (ref >60)
Glucose, Bld: 79 mg/dL (ref 70–99)
Potassium: 3.4 mmol/L — ABNORMAL LOW (ref 3.5–5.1)
Sodium: 145 mmol/L (ref 135–145)

## 2023-02-26 LAB — CBC
HCT: 28.2 % — ABNORMAL LOW (ref 36.0–46.0)
Hemoglobin: 8.8 g/dL — ABNORMAL LOW (ref 12.0–15.0)
MCH: 27 pg (ref 26.0–34.0)
MCHC: 31.2 g/dL (ref 30.0–36.0)
MCV: 86.5 fL (ref 80.0–100.0)
Platelets: 122 10*3/uL — ABNORMAL LOW (ref 150–400)
RBC: 3.26 MIL/uL — ABNORMAL LOW (ref 3.87–5.11)
RDW: 17.5 % — ABNORMAL HIGH (ref 11.5–15.5)
WBC: 8.8 10*3/uL (ref 4.0–10.5)
nRBC: 0 % (ref 0.0–0.2)

## 2023-02-26 LAB — CULTURE, BLOOD (ROUTINE X 2)
Special Requests: ADEQUATE
Special Requests: ADEQUATE

## 2023-02-26 LAB — MAGNESIUM: Magnesium: 2.1 mg/dL (ref 1.7–2.4)

## 2023-02-26 SURGERY — AMPUTATION, TOE
Anesthesia: General | Site: Toe | Laterality: Right

## 2023-02-26 MED ORDER — BUPIVACAINE HCL 0.5 % IJ SOLN
INTRAMUSCULAR | Status: DC | PRN
  Administered 2023-02-26: 6 mL

## 2023-02-26 MED ORDER — ONDANSETRON HCL 4 MG/2ML IJ SOLN
INTRAMUSCULAR | Status: DC | PRN
  Administered 2023-02-26: 4 mg via INTRAVENOUS

## 2023-02-26 MED ORDER — PROPOFOL 1000 MG/100ML IV EMUL
INTRAVENOUS | Status: AC
  Filled 2023-02-26: qty 100

## 2023-02-26 MED ORDER — PROPOFOL 500 MG/50ML IV EMUL
INTRAVENOUS | Status: DC | PRN
  Administered 2023-02-26: 120 ug/kg/min via INTRAVENOUS

## 2023-02-26 MED ORDER — PROPOFOL 10 MG/ML IV BOLUS
INTRAVENOUS | Status: DC | PRN
  Administered 2023-02-26: 40 mg via INTRAVENOUS

## 2023-02-26 MED ORDER — DEXTROSE 50 % IV SOLN: 1.0000 | Freq: Once | INTRAVENOUS | Status: DC

## 2023-02-26 MED ORDER — FENTANYL CITRATE (PF) 100 MCG/2ML IJ SOLN
INTRAMUSCULAR | Status: AC
  Filled 2023-02-26: qty 2

## 2023-02-26 MED ORDER — DEXTROSE 50 % IV SOLN
1.0000 | INTRAVENOUS | Status: AC
  Administered 2023-02-26: 50 mL via INTRAVENOUS
  Filled 2023-02-26: qty 50

## 2023-02-26 MED ORDER — LOPERAMIDE HCL 2 MG PO CAPS
2.0000 mg | ORAL_CAPSULE | Freq: Once | ORAL | Status: AC
  Administered 2023-02-26: 2 mg via ORAL
  Filled 2023-02-26: qty 1

## 2023-02-26 MED ORDER — LOPERAMIDE HCL 2 MG PO CAPS
2.0000 mg | ORAL_CAPSULE | Freq: Three times a day (TID) | ORAL | Status: DC | PRN
  Administered 2023-02-26 – 2023-02-27 (×2): 2 mg via ORAL
  Filled 2023-02-26 (×2): qty 1

## 2023-02-26 MED ORDER — DEXTROSE 50 % IV SOLN: 12.5000 g | INTRAVENOUS | Status: DC

## 2023-02-26 MED ORDER — FENTANYL CITRATE (PF) 100 MCG/2ML IJ SOLN
INTRAMUSCULAR | Status: DC | PRN
  Administered 2023-02-26: 25 ug via INTRAVENOUS
  Administered 2023-02-26: 50 ug via INTRAVENOUS
  Administered 2023-02-26: 25 ug via INTRAVENOUS

## 2023-02-26 MED ORDER — SACCHAROMYCES BOULARDII 250 MG PO CAPS
250.0000 mg | ORAL_CAPSULE | Freq: Two times a day (BID) | ORAL | Status: DC
  Administered 2023-02-26 – 2023-02-28 (×4): 250 mg via ORAL
  Filled 2023-02-26 (×5): qty 1

## 2023-02-26 MED ORDER — BUPIVACAINE HCL (PF) 0.5 % IJ SOLN
INTRAMUSCULAR | Status: AC
  Filled 2023-02-26: qty 30

## 2023-02-26 MED ORDER — 0.9 % SODIUM CHLORIDE (POUR BTL) OPTIME
TOPICAL | Status: DC | PRN
  Administered 2023-02-26: 500 mL

## 2023-02-26 SURGICAL SUPPLY — 41 items
BLADE MED AGGRESSIVE (BLADE) IMPLANT
BLADE OSC/SAGITTAL MD 5.5X18 (BLADE) IMPLANT
BLADE SURG 15 STRL LF DISP TIS (BLADE) IMPLANT
BLADE SURG 15 STRL SS (BLADE) ×1
BLADE SURG MINI STRL (BLADE) IMPLANT
BNDG ESMARCH 4 X 12 STRL LF (GAUZE/BANDAGES/DRESSINGS) ×1
BNDG ESMARCH 4X12 STRL LF (GAUZE/BANDAGES/DRESSINGS) ×1 IMPLANT
BNDG GAUZE DERMACEA FLUFF 4 (GAUZE/BANDAGES/DRESSINGS) ×1 IMPLANT
BNDG GZE DERMACEA 4 6PLY (GAUZE/BANDAGES/DRESSINGS) ×1
CNTNR URN SCR LID CUP LEK RST (MISCELLANEOUS) IMPLANT
CONT SPEC 4OZ STRL OR WHT (MISCELLANEOUS) ×2
CUFF TOURN SGL QUICK 12 (TOURNIQUET CUFF) IMPLANT
CUFF TOURN SGL QUICK 18X4 (TOURNIQUET CUFF) IMPLANT
ELECT REM PT RETURN 9FT ADLT (ELECTROSURGICAL) ×1
ELECTRODE REM PT RTRN 9FT ADLT (ELECTROSURGICAL) ×1 IMPLANT
GAUZE SPONGE 4X4 12PLY STRL (GAUZE/BANDAGES/DRESSINGS) ×2 IMPLANT
GAUZE XEROFORM 1X8 LF (GAUZE/BANDAGES/DRESSINGS) IMPLANT
GLOVE BIOGEL M 7.0 STRL (GLOVE) ×1 IMPLANT
GLOVE BIOGEL PI IND STRL 7.5 (GLOVE) ×1 IMPLANT
GOWN STRL REUS W/ TWL LRG LVL3 (GOWN DISPOSABLE) ×2 IMPLANT
GOWN STRL REUS W/TWL LRG LVL3 (GOWN DISPOSABLE) ×2
KIT TURNOVER KIT A (KITS) ×1 IMPLANT
LABEL OR SOLS (LABEL) ×1 IMPLANT
MANIFOLD NEPTUNE II (INSTRUMENTS) ×1 IMPLANT
NDL BIOPSY JAMSHIDI 11X6 (NEEDLE) IMPLANT
NDL HYPO 25X1 1.5 SAFETY (NEEDLE) ×2 IMPLANT
NEEDLE BIOPSY JAMSHIDI 11X6 (NEEDLE) IMPLANT
NEEDLE HYPO 25X1 1.5 SAFETY (NEEDLE) ×2 IMPLANT
NS IRRIG 500ML POUR BTL (IV SOLUTION) ×1 IMPLANT
PACK EXTREMITY ARMC (MISCELLANEOUS) ×1 IMPLANT
PAD ABD DERMACEA PRESS 5X9 (GAUZE/BANDAGES/DRESSINGS) IMPLANT
SOL PREP PVP 2OZ (MISCELLANEOUS) ×1
SOLUTION PREP PVP 2OZ (MISCELLANEOUS) ×1 IMPLANT
SUT ETHILON 3-0 FS-10 30 BLK (SUTURE) ×1
SUT ETHILON 4 0 PS 2 18 (SUTURE) IMPLANT
SUT MNCRL AB 3-0 PS2 27 (SUTURE) IMPLANT
SUTURE EHLN 3-0 FS-10 30 BLK (SUTURE) ×1 IMPLANT
SWAB CULTURE AMIES ANAERIB BLU (MISCELLANEOUS) IMPLANT
SYR 10ML LL (SYRINGE) ×1 IMPLANT
TRAP FLUID SMOKE EVACUATOR (MISCELLANEOUS) ×1 IMPLANT
WATER STERILE IRR 500ML POUR (IV SOLUTION) ×1 IMPLANT

## 2023-02-26 NOTE — Progress Notes (Signed)
[facility] Progress Note  Patient: Kara [name]    ACZ:[id number]  DOA: 02/23/2023     Date of Service: the patient was seen and examined on 02/26/2023  Chief Complaint  Patient presents with   Fall   Brief hospital course: Kara [name] is a 77 y.o. female with medical history significant of HTN, HDL, DM, diabetic neuropathy, dCHF, CKD-4, anemia, kidney stone, chronic back pain, right breast cancer-DCIS, essential tremor, chronic diabetic foot ulcer (right second toe) who presents with abdominal pain, fever, fall.   Patient states that she has a remote history of abdominal surgery due to abdominal abscess, but does not remember what caused her abdominal abscess.  She has chronic lower abdominal pain for several months, which has been intermittent, sometimes becomes severe recently.  Currently her lower abdominal pain is mild, aching, nonradiating.  Not associated with nausea, vomiting or diarrhea.  Patient has fever and chills.  Patient denies chest pain, cough, SOB.  No symptoms of UTI. Pt has a chronic right second toe ulcer and following up with Dr. Allena Katz of podiatry.  Patient has generalized weakness.  She states that she has had 2 falls from low height. Pt fell off the toilet last night and slid out of a chair today.  Denies loss of consciousness or hitting her head.  She has pain in the low back and right hip and right knee.    Data reviewed independently and ED Course: pt was found to have WBC 10.6, lactic acid 1.9, negative urinalysis, negative COVID PCR, worsening renal function with creatinine 1.91, BUN 37, GFR 27 (recent baseline creatinine 1.35), temperature 103.2, blood pressure 111/89, heart rate 138 --> 95, RR 36 --> 23, oxygen sat 95% on room air.  Chest x-ray negative.  CT head negative.  X-ray of right and lower back and right hip/pelvis negative for acute injury. Patient is admitted to telemetry bed as inpatient.   CT scan of chest/abdomen/pelvis: 1. No acute  intrathoracic or intra-abdominal pathology identified. 2. Mild coronary artery calcification. 3. Morphologic changes in keeping with pulmonary arterial hypertension. 4. Mild hepatomegaly and hepatic steatosis. 5. Severe sigmoid diverticulosis. 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 An infiltrative malignancy is less likely, but not completely excluded. Correlation with colonoscopy may be helpful for further evaluation.   Assessment and Plan: Sepsis: met criteria w/ leukocytosis, tachycardia, tachypnea, fever and bacteremia likely secondary to right 2nd toe osteomyelitis. Continue on IV zosyn.  Ultracet, tylenol prn for pain. Zofran prn nausea/vomiting. Continue on IVFs   Bacteremia: blood cxs growing streg agalactiae, pansensitive.  Likely secondary to 2nd toe osteomyelitis. Continue on IV zosyn.  Repeat blood cxs tomorrow  Follow ID consult for duration of antibiotics  Osteomyelitis of right 2nd toe:  MRI positive for second toe osteomyelitis and multiple other findings Continue on IV zosyn.  Podiatry consulted, scheduled for amputation today  Follow ID consult for further recommendation of antibiotics    Fall at home, initial encounter: CT head negative.  No acute injury on imaging of x-ray of right knee and right hip/pelvis. OT recs SNF. PT consulted    DM2: HbA1c 8.3, poorly controlled. Continue on glargine, SSI w/ accuchecks    Hypokalemia: potassium given    HLD: continue on statin  Chronic diastolic CHF: echo on 12/25/2021 showd EF 60-85% with grade I diastolic dysfunction. Trace leg edema, but no pulm edema chest x-ray, BNP normal 63.  CHF is compensated.Holding lasix. Monitor I/Os    CKDIV:  baseline Cr 1.35-1.6. Cr is trending up slightly from day prior. Avoid nephrotoxic meds    IDA: continue on iron supplement    Chronic bilateral low back pain with left-sided sciatica: continue on home dose of ultracet prn    Thrombocytopenia: etiology unclear. Labile      Diverticulosis: Severe sigmoid diverticulosis. 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 An infiltrative malignancy is less likely, but not completely excluded. Correlation with colonoscopy may be helpful for further evaluation. Colonoscopy can be done as an outpatient   Chronic diarrhea, patient uses Imodium at home, as prn As needed resume Imodium as needed Started probiotics   Obesity class II Body mass index is 36.34 kg/m.  Interventions: Calorie restricted diet and daily exercise advised to lose body weight.  Lifestyle modification discussed.   Diet: NPO DVT Prophylaxis: Subcutaneous Heparin    Advance goals of care discussion: Full code  Family Communication: family was present at bedside, at the time of interview.  The pt provided permission to discuss medical plan with the family. Opportunity was given to ask question and all questions were answered satisfactorily.   Disposition:  Pt is from Home, admitted with sepsis due to right second toe osteomyelitis, still on IV antibiotics and needs surgical intervention per podiatry, ID consulted, which precludes a safe discharge. Discharge to Home, when cleared for podiatry and ID.  Subjective: No significant events overnight, patient denies any pain in the right toe, she has neuropathy.  Patient was complaining of diarrhea which is chronic, had 3-4 episodes today and she is  requesting Imodium which she usually takes at home. Denied any chest pain or palpitation, no shortness of breath, no abdominal pain.  Physical Exam: General: NAD, lying comfortably Appear in no distress, affect appropriate Eyes: PERRLA ENT: Oral Mucosa Clear, moist  Neck: no JVD,  Cardiovascular: S1 and S2 Present, no Murmur,  Respiratory: good respiratory effort, Bilateral Air entry equal and Decreased, no Crackles, no wheezes Abdomen: Bowel Sound present, Soft and no tenderness,  Skin: no rashes Extremities: no Pedal edema, no calf tenderness, right second toe ulcer on the plantar surface, some blackish discoloration, no signs of infection or drainage. Neurologic: without any new focal findings Gait not checked due to patient safety concerns  Vitals:   02/25/23 1716 02/25/23 2357 02/26/23 0741 02/26/23 1420  BP: 107/83 (!) 136/51 (!) 142/95 (!) 162/60  Pulse: 100 88 86 81  Resp: 19 15 14 14   Temp:  98.1 F (36.7 C) 98.3 F (36.8 C) 98.3 F (36.8 C)  TempSrc:      SpO2: 99% 96% 97% 96%  Weight:      Height:        Intake/Output Summary (Last 24 hours) at 02/26/2023 1628 Last data filed at 02/25/2023 1913 Gross per 24 hour  Intake 1218.54 ml  Output --  Net 1218.54 ml   Filed Weights   02/25/23 0250  Weight: 84.4 kg    Data Reviewed: I have personally reviewed and interpreted daily labs, tele strips, imagings as discussed above. I reviewed all  nursing notes, pharmacy notes, vitals, pertinent old records I have discussed plan of care as described above with RN and patient/family.  CBC: Recent Labs  Lab 02/23/23 1602 02/24/23 0541 02/25/23 0458 02/26/23 0429  WBC 10.6* 11.5* 10.0 8.8  NEUTROABS 9.8*  --   --   --   HGB 10.5* 8.6* 9.0* 8.8*  HCT 33.3* 27.6* 27.9* 28.2*  MCV 86.9 89.0 85.6 86.5  PLT 134* 107* 106* 122*   Basic Metabolic Panel: Recent Labs  Lab 02/23/23 1602 02/24/23 0541 02/25/23 0458 02/26/23 0429  NA 134* 138 139 145  K 3.9 3.3* 3.2*  3.4*  CL 96* 103 107 113*  CO2 26 25 22 24   GLUCOSE 240* 218* 141* 79  BUN 37* 31* 33* 29*  CREATININE 1.91* 1.84* 1.87* 1.74*  CALCIUM 8.7* 7.9* 7.8* 8.3*  MG  --   --   --  2.1  PHOS  --   --   --  2.4*    Studies: No results found.  Scheduled Meds:  acetaminophen  1,000 mg Oral Once   ferrous gluconate  324 mg Oral Daily   heparin  5,000 Units Subcutaneous Q8H   insulin aspart  0-5 Units Subcutaneous QHS   insulin aspart  0-9 Units Subcutaneous TID WC   insulin glargine-yfgn  20 Units Subcutaneous QHS   pantoprazole  40 mg Oral Daily   pregabalin  100 mg Oral BID   rosuvastatin  20 mg Oral Daily   saccharomyces boulardii  250 mg Oral BID   Continuous Infusions:  sodium chloride 75 mL/hr at 02/25/23 1826   piperacillin-tazobactam (ZOSYN)  IV 3.375 g (02/26/23 1443)   PRN Meds: acetaminophen, hydrALAZINE, loperamide, morphine injection, ondansetron (ZOFRAN) IV, traMADol-acetaminophen  Time spent: 35 minutes  Author: Gillis Santa. MD [facility] 02/26/2023 4:28 PM  To reach On-call, see care teams to locate the attending and reach out to them via [email or web address]. If 7PM-7AM, please contact night-coverage If you still have difficulty reaching the attending provider, please page the [facility] (Director on Call) for [facility] on amion for assistance.

## 2023-02-26 NOTE — Anesthesia Postprocedure Evaluation (Signed)
Anesthesia Post Note  Patient: Kara [name]  Procedure(s) Performed: SECOND DIGIT AMPUTATION (Right: Toe)  Patient location during evaluation: PACU Anesthesia Type: General Level of consciousness: awake and alert Pain management: pain level controlled Vital Signs Assessment: post-procedure vital signs reviewed and stable Respiratory status: spontaneous breathing, nonlabored ventilation, respiratory function stable and patient connected to nasal cannula oxygen Cardiovascular status: blood pressure returned to baseline and stable Postop Assessment: no apparent nausea or vomiting Anesthetic complications: no  No notable events documented.   Last Vitals:  Vitals:   02/26/23 1750 02/26/23 1800  BP:  114/81  Pulse: 90 83  Resp: 20 (!) 22  Temp:  36.9 C  SpO2: 95% 94%    Last Pain:  Vitals:   02/26/23 1800  TempSrc:   PainSc: 0-No pain                 Stephanie Coup

## 2023-02-26 NOTE — Progress Notes (Signed)
PT Cancellation Note  Patient Details Name: Kara [name] MRN: [id number] DOB: 1945-09-04   Cancelled Treatment:    Reason Eval/Treat Not Completed: Patient declined, no reason specified Patient declines ambulation at this time. States she is having surgery at 4 pm. Will re-evaluate tomorrow after surgery as appropriate.   Jailynn Lavalais 02/26/2023, 1:33 PM

## 2023-02-26 NOTE — Anesthesia Preprocedure Evaluation (Signed)
Anesthesia Evaluation  Patient identified by MRN, date of birth, ID band Patient awake    Reviewed: Allergy & Precautions, H&P , NPO status , Patient's Chart, lab work & pertinent test results  History of Anesthesia Complications Negative for: history of anesthetic complications  Airway Mallampati: III  TM Distance: <3 FB Neck ROM: limited    Dental  (+) Chipped, Dental Advidsory Given   Pulmonary neg shortness of breath, sleep apnea , pneumonia, resolved, neg COPD, neg recent URI, former smoker          Cardiovascular Exercise Tolerance: Good hypertension, (-) angina (-) Past MI, (-) Cardiac Stents and (-) DOE (-) dysrhythmias (-) Valvular Problems/Murmurs     Neuro/Psych  Neuromuscular disease  negative psych ROS   GI/Hepatic negative GI ROS, Neg liver ROS,neg GERD  ,,  Endo/Other  diabetes, Type 2, Insulin Dependent    Renal/GU      Musculoskeletal   Abdominal   Peds  Hematology negative hematology ROS (+)   Anesthesia Other Findings Past Medical History: No date: Anemia No date: Arthropathy No date: Benign neoplasm of breast No date: Chest pain No date: Chronic kidney disease     Comment:  30% kidney function No date: Diabetes (HCC) No date: Diabetic retinopathy (HCC) No date: Dyspnea No date: Edema No date: History of kidney stones No date: Hyperlipemia No date: Hypersomnia with sleep apnea No date: Hypertension No date: 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HCC) No date: Lumbago No date: Osteoarthrosis No date: Ovarian failure No date: SOB (shortness of breath)  Past Surgical History: No date: BILATERAL SALPINGECTOMY 2014: BREAST BIOPSY; Right     Comment:  neg- core 12/21/2018: BREAST BIOPSY; Right     Comment:  [place] bx, venus clip,  DUCTAL CARCINOMA IN SITU 12/21/2018: BREAST BIOPSY; Right     Comment:  [place] bx, ribbon clip,  FIBROEPITHELIAL PROLIFERATION WITH               SCLEROSIS 01/01/2019:  BREAST BIOPSY; Right     Comment:  Affirm bx "X" clip-path pending No date: BREAST DUCTAL SYSTEM EXCISION 2014: BREAST EXCISIONAL BIOPSY; Right     Comment:  neg 01/22/2019: BREAST LUMPECTOMY; Right     Comment:  path pending No date: CATARACT EXTRACTION No date: CHOLECYSTECTOMY 04/14/2015: CYSTOSCOPY WITH STENT PLACEMENT; Left     Comment:  Procedure: CYSTOSCOPY WITH STENT PLACEMENT;  Surgeon:               Vanna Scotland, MD;  Location: [facility];  Service:               Urology;  Laterality: Left; 09/29/2014: EXTRACORPOREAL SHOCK WAVE LITHOTRIPSY; Left     Comment:  Procedure: EXTRACORPOREAL SHOCK WAVE LITHOTRIPSY (ESWL);              Surgeon: Vanna Scotland, MD;  Location: [facility];                Service: Urology;  Laterality: Left; No date: EYE SURGERY     Comment:  bilateral cataract No date: LAPAROSCOPIC OOPHERECTOMY; Left No date: TONSILLECTOMY No date: TUBAL LIGATION 04/14/2015: URETEROSCOPY WITH HOLMIUM LASER LITHOTRIPSY; Left     Comment:  Procedure: URETEROSCOPY WITH HOLMIUM LASER LITHOTRIPSY;               Surgeon: Vanna Scotland, MD;  Location: [facility];                Service: Urology;  Laterality: Left;  BMI    Body Mass  Index: 39.92 kg/m      Reproductive/Obstetrics negative OB ROS                             Anesthesia Physical Anesthesia Plan  ASA: 3  Anesthesia Plan: General   Post-op Pain Management: Minimal or no pain anticipated   Induction: Intravenous  PONV Risk Score and Plan: 3 and Propofol infusion, TIVA and Ondansetron  Airway Management Planned: Nasal Cannula  Additional Equipment: None  Intra-op Plan:   Post-operative Plan:   Informed Consent: I have reviewed the patients History and Physical, chart, labs and discussed the procedure including the risks, benefits and alternatives for the proposed anesthesia with the patient or authorized representative who has indicated his/her understanding and acceptance.      Dental advisory given  Plan Discussed with: CRNA and Surgeon  Anesthesia Plan Comments: (Discussed risks of anesthesia with patient, including possibility of difficulty with spontaneous ventilation under anesthesia necessitating airway intervention, PONV, and rare risks such as cardiac or respiratory or neurological events, and allergic reactions. Discussed the role of CRNA in patient's perioperative care. Patient understands.)       Anesthesia Quick Evaluation

## 2023-02-26 NOTE — Op Note (Signed)
Patient Name: Kara [name] DOB: Mar 31, 1946  MRN: [id number]   Date of Service: 02/23/2023 - 02/26/2023  Surgeon: Dr. Sharl Ma, DPM Assistants: None Pre-operative Diagnosis:  Osteomyelitis Post-operative Diagnosis:  Osteomyelitis Procedures: Procedures:   * SECOND DIGIT AMPUTATION RIGHT FOOT Pathology/Specimens: ID Type Source Tests Collected by Time Destination  1 : right second toe Tissue PATH Bone resection SURGICAL PATHOLOGY Edwin Cap, DPM 02/26/2023 1730   A : right second toe bone culture Tissue PATH Bone resection AEROBIC/ANAEROBIC CULTURE W GRAM STAIN (SURGICAL/DEEP WOUND) Edwin Cap, DPM 02/26/2023 1731   B : right second toe wound Tissue Path Tissue AEROBIC/ANAEROBIC CULTURE W GRAM STAIN (SURGICAL/DEEP WOUND) Edwin Cap, DPM 02/26/2023 1732    Anesthesia: MAC w/ local Hemostasis: * No tourniquets in log * Estimated Blood Loss: 5cc Materials: * No implants in log * Medications: 6cc 0.5% marcaine plain Complications: no complications noted  Indications for Procedure:  This is a 77 y.o. female with a history of type 2 diabetes with neuropathy and a distal 2nd toe ulcer right foot. Osteomyelitis was found in the digit on MRI. Amputation was recommended. All risks, benefits and potential complications discussed prior to the procedure. All questions addressed. Informed consent signed and reviewed.      Procedure in Detail: Patient was identified in pre-operative holding area. Formal consent was signed and the right lower extremity was marked. Patient was brought back to the operating room. Anesthesia was induced. The extremity was prepped and draped in the usual sterile fashion. Timeout was taken to confirm patient name, laterality, and procedure prior to incision.   Attention was then directed to the right second toe where an incision was made in an ellipse. Dissection was carried down to level of bone.  Dissection was continued to the metarsophalangeal joint  and all collateral ligaments were freed at the joint.  The bone soft tissue attachments of the proximal phalanx were removed and passed for pathology.  The remaining metatarsal head appeared healthy and viable.  The area was copiously irrigated.  The skin was reapproximated with nylon and monocryl.   The foot was then dressed with xeroform and dry dressings. Patient tolerated the procedure well.   Disposition: Following a period of post-operative monitoring, patient will be transferred to the floor. She may be weightbearing as tolerated in a post op shoe. 7-10 days of oral antibiotics are recommended per culture data. OK to discharge when ready.

## 2023-02-26 NOTE — H&P (Signed)
History and Physical Interval Note:  02/26/2023 5:09 PM  Kara [name]  has presented today for surgery, with the diagnosis of right second toe osteomyelitis.  The various methods of treatment have been discussed with the patient and family. After consideration of risks, benefits and other options for treatment, the patient has consented to   Procedure(s): SECOND DIGIT AMPUTATION (Right) as a surgical intervention.  The patient's history has been reviewed, patient examined, no change in status, stable for surgery.  I have reviewed the patient's chart and labs.  Questions were answered to the patient's satisfaction.     Edwin Cap

## 2023-02-26 NOTE — Consult Note (Signed)
NAME: Kara [name]  DOB: 15-Sep-1945  MRN: [id number]  Date/Time: 02/27/23 1500   REQUESTING PROVIDER: Dr.Kumar Subjective:  REASON FOR CONSULT: Group B streptococcus bacteremia ? Kara [name] is a 77 y.o. with a history of DM, HTN, , CHF, CKD, anemia, ca breast rt , lumpectomy, radiation, now on tamoxifen  bullous pemphigoid h/o lumbar fusion   Came in with fall Pt was sitting on the toilet and she fell off the seat. EMT was called and they helped her back, She did not come to the hospitlal that day. The next ( Sunday) she was feeling weak and EMS was called and she was brought to the ED- She does not remember having a fever but EMT documented 102 She did not have any abdominal pain, nasuea or vonting. She did not have cough, sob, urinary symptoms. She has a toe ulcer for 3 months and was seeing podiatrist. The bone was visible but she did not have any worsening symptoms related to that.  In the ED  02/23/23 15:50  BP 136/65  Temp 103.2 F (39.6 C) !  Pulse Rate 138 !  SpO2 97 %    Latest Reference Range & Units 02/23/23 16:02  WBC 4.0 - 10.5 K/uL 10.6 (H)  Hemoglobin 12.0 - 15.0 g/dL 40.9 (L)  HCT 81.1 - 91.4 % 33.3 (L)  Platelets 150 - 400 K/uL 134 (L)  Creatinine 0.44 - 1.00 mg/dL 7.82 (H)     Blood culture sent CT abdomen revealed a colo colonic fistula at the sigmoid level  which was reported as chronic  MRI of the foot revealed 2 nd toe osteo changes and she had amputation of the 2nd toe yesterday She has been on zosyn and she wants to do oral antibiotics She wants to go home  Past Medical History:  Diagnosis Date   Anemia    Arthropathy    Benign neoplasm of breast    Breast cancer (HCC)    DCIS   Chest pain    CKD (chronic kidney disease), stage IV (HCC)    Diabetes (HCC)    Diabetic retinopathy (HCC)    Dyspnea    Edema    History of kidney stones    Hyperlipemia    Hypersomnia with sleep apnea    Hypertension    Inflammatory and toxic neuropathy  (HCC)    Lumbago    Neuropathy    Osteoarthrosis    Osteoporosis    Ovarian failure    Personal history of radiation therapy    Pneumonia due to COVID-19 virus 12/21/2021   a.) required inpatient admission   SOB (shortness of breath)     Past Surgical History:  Procedure Laterality Date   ANTERIOR LATERAL LUMBAR FUSION WITH PERCUTANEOUS SCREW 2 LEVEL N/A 07/03/2022   Procedure: L3-5 LATERAL LUMBAR INTERBODY FUSION WITH POSTERIOR SPINAL FUSION;  Surgeon: Venetia Night, MD;  Location: [facility];  Service: Neurosurgery;  Laterality: N/A;   APPLICATION OF INTRAOPERATIVE CT SCAN N/A 07/03/2022   Procedure: APPLICATION OF INTRAOPERATIVE CT SCAN;  Surgeon: Venetia Night, MD;  Location: [facility];  Service: Neurosurgery;  Laterality: N/A;   BILATERAL SALPINGECTOMY     BREAST BIOPSY Right 2014   neg- core   BREAST BIOPSY Right 12/21/2018   [place] bx, venus clip,  DUCTAL CARCINOMA IN SITU   BREAST BIOPSY Right 12/21/2018   [place] bx, ribbon clip,  FIBROEPITHELIAL PROLIFERATION WITH SCLEROSIS   BREAST BIOPSY Right 01/01/2019   Affirm bx "X" clip-path pending  BREAST DUCTAL SYSTEM EXCISION     BREAST EXCISIONAL BIOPSY Right 2014   neg   BREAST LUMPECTOMY Right 01/22/2019   path pending   CATARACT EXTRACTION     CHOLECYSTECTOMY     CYSTOSCOPY WITH STENT PLACEMENT Left 04/14/2015   Procedure: CYSTOSCOPY WITH STENT PLACEMENT;  Surgeon: Vanna Scotland, MD;  Location: [facility];  Service: Urology;  Laterality: Left;   EXTRACORPOREAL SHOCK WAVE LITHOTRIPSY Left 09/29/2014   Procedure: EXTRACORPOREAL SHOCK WAVE LITHOTRIPSY (ESWL);  Surgeon: Vanna Scotland, MD;  Location: [facility];  Service: Urology;  Laterality: Left;   EYE SURGERY     bilateral cataract   IRRIGATION AND DEBRIDEMENT ABSCESS Right 03/24/2019   Procedure: IRRIGATION AND DEBRIDEMENT ABSCESS RIGHT BREAST;  Surgeon: [name], MD;  Location: [facility];  Service: General;  Laterality: Right;   LAPAROSCOPIC OOPHERECTOMY Left     PARTIAL MASTECTOMY WITH NEEDLE LOCALIZATION Right 01/22/2019   Procedure: PARTIAL MASTECTOMY WITH NEEDLE LOCALIZATION;  Surgeon: [name], MD;  Location: [facility];  Service: General;  Laterality: Right;   TONSILLECTOMY     TUBAL LIGATION     URETEROSCOPY WITH HOLMIUM LASER LITHOTRIPSY Left 04/14/2015   Procedure: URETEROSCOPY WITH HOLMIUM LASER LITHOTRIPSY;  Surgeon: Vanna Scotland, MD;  Location: [facility];  Service: Urology;  Laterality: Left;    Social History   Socioeconomic History   Marital status: Married    Spouse name: Not on file   Number of children: Not on file   Years of education: Not on file   Highest education level: Not on file  Occupational History   Not on file  Tobacco Use   Smoking status: Former    Current packs/day: 0.00    Average packs/day: 1 pack/day for 30.0 years (30.0 ttl pk-yrs)    Types: Cigarettes    Start date: 04/10/1965    Quit date: 04/11/1995    Years since quitting: 27.8   Smokeless tobacco: Never  Vaping Use   Vaping status: Never Used  Substance and Sexual Activity   Alcohol use: No    Alcohol/week: 0.0 standard drinks of alcohol   Drug use: No   Sexual activity: Not on file  Other Topics Concern   Not on file  Social History Narrative   Not on file   Social Determinants of Health   Financial Resource Strain: Low Risk  (10/03/2020)   Overall Financial Resource Strain (CARDIA)    Difficulty of Paying Living Expenses: Not very hard  Food Insecurity: No Food Insecurity (02/24/2023)   Hunger Vital Sign    Worried About Running Out of Food in the Last Year: Never true    Ran Out of Food in the Last Year: Never true  Transportation Needs: No Transportation Needs (02/24/2023)   PRAPARE - Administrator, Civil Service (Medical): No    Lack of Transportation (Non-Medical): No  Physical Activity: Not on file  Stress: Not on file  Social Connections: Not on file  Intimate Partner Violence: Not At Risk (02/24/2023)    Humiliation, Afraid, Rape, and Kick questionnaire    Fear of Current or Ex-Partner: No    Emotionally Abused: No    Physically Abused: No    Sexually Abused: No    Family History  Problem Relation Age of Onset   Breast cancer Maternal Grandmother 31   Colon cancer Mother    Lung cancer Father    Allergies  Allergen Reactions   Amoxicillin Nausea And Vomiting   I? Current Facility-Administered Medications  Medication Dose Route Frequency Provider Last Rate Last Admin   0.9 %  sodium chloride infusion   Intravenous Continuous Edwin Cap, DPM 75 mL/hr at 02/26/23 1715 Restarted at 02/26/23 1732   acetaminophen (TYLENOL) tablet 1,000 mg  1,000 mg Oral Once Edwin Cap, DPM       acetaminophen (TYLENOL) tablet 650 mg  650 mg Oral Q6H PRN Edwin Cap, DPM   650 mg at 02/24/23 1211   ferrous gluconate (FERGON) tablet 324 mg  324 mg Oral Daily Edwin Cap, DPM   324 mg at 02/25/23 1001   heparin injection 5,000 Units  5,000 Units Subcutaneous Q8H Edwin Cap, DPM   5,000 Units at 02/26/23 1438   hydrALAZINE (APRESOLINE) injection 5 mg  5 mg Intravenous Q2H PRN McDonald, Rachelle Hora, DPM       insulin aspart (novoLOG) injection 0-5 Units  0-5 Units Subcutaneous QHS McDonald, Adam R, DPM       insulin aspart (novoLOG) injection 0-9 Units  0-9 Units Subcutaneous TID WC Edwin Cap, DPM   1 Units at 02/25/23 1912   insulin glargine-yfgn (SEMGLEE) injection 20 Units  20 Units Subcutaneous QHS Edwin Cap, DPM   20 Units at 02/25/23 2107   loperamide (IMODIUM) capsule 2 mg  2 mg Oral TID PRN Edwin Cap, DPM   2 mg at 02/26/23 1439   morphine (PF) 2 MG/ML injection 1 mg  1 mg Intravenous Q3H PRN Edwin Cap, DPM   1 mg at 02/25/23 1419   ondansetron (ZOFRAN) injection 4 mg  4 mg Intravenous Q8H PRN Edwin Cap, DPM       pantoprazole (PROTONIX) EC tablet 40 mg  40 mg Oral Daily Sharl Ma R, DPM   40 mg at 02/25/23 1002   piperacillin-tazobactam  (ZOSYN) IVPB 3.375 g  3.375 g Intravenous Q8H McDonald, Adam R, DPM 12.5 mL/hr at 02/26/23 1443 3.375 g at 02/26/23 1443   pregabalin (LYRICA) capsule 100 mg  100 mg Oral BID Edwin Cap, DPM   100 mg at 02/25/23 2103   rosuvastatin (CRESTOR) tablet 20 mg  20 mg Oral Daily Edwin Cap, DPM   20 mg at 02/25/23 1001   saccharomyces boulardii (FLORASTOR) capsule 250 mg  250 mg Oral BID Edwin Cap, DPM   250 mg at 02/26/23 1856   traMADol-acetaminophen (ULTRACET) 37.5-325 MG per tablet 1 tablet  1 tablet Oral Q6H PRN Edwin Cap, DPM         Abtx:  Anti-infectives (From admission, onward)    Start     Dose/Rate Route Frequency Ordered Stop   02/23/23 2200  piperacillin-tazobactam (ZOSYN) IVPB 3.375 g        3.375 g 12.5 mL/hr over 240 Minutes Intravenous Every 8 hours 02/23/23 2043     02/23/23 2030  metroNIDAZOLE (FLAGYL) IVPB 500 mg        500 mg 100 mL/hr over 60 Minutes Intravenous  Once 02/23/23 2025 02/23/23 2218   02/23/23 1615  cefTRIAXone (ROCEPHIN) 2 g in sodium chloride 0.9 % 100 mL IVPB  Status:  Discontinued        2 g 200 mL/hr over 30 Minutes Intravenous Every 24 hours 02/23/23 1611 02/23/23 2027       REVIEW OF SYSTEMS:  Const: fever,  chills, negative weight loss Eyes: negative diplopia or visual changes, negative eye pain ENT: negative coryza, negative sore throat Resp: negative cough, hemoptysis, dyspnea Cards: negative for  chest pain, palpitations, lower extremity edema GU: negative for frequency, dysuria and hematuria GI: Negative for abdominal pain, diarrhea, bleeding, constipation Skin: negative for rash and pruritus Heme: negative for easy bruising and gum/nose bleeding MS:  weakness Neurolo:falls,  Psych: no anxiety or depression Endocrine: , diabetes Allergy/Immunology- amoxicillin is not an allergic reaction but side effects- diarrhea Objective:  VITALS:  BP 114/81 (BP Location: Left Leg)   Pulse 83   Temp 98.4 F (36.9 C)    Resp (!) 22   Ht 5' (1.524 m)   Wt 84.4 kg   SpO2 94%   BMI 36.34 kg/m   PHYSICAL EXAM:  General: Alert, cooperative, no distress, appears stated age. pale Head: Normocephalic, without obvious abnormality, atraumatic. Eyes: Conjunctivae clear, anicteric sclerae. Pupils are equal ENT Nares normal. No drainage or sinus tenderness. Lips, mucosa, and tongue normal. No Thrush Neck: Supple, symmetrical, no adenopathy, thyroid: non tender no carotid bruit and no JVD. Back: No CVA tenderness. Lungs: Clear to auscultation bilaterally. No Wheezing or Rhonchi. No rales. Heart: Regular rate and rhythm, no murmur, rub or gallop. Abdomen: Soft, non-tender,not distended. Bowel sounds normal. No masses Extremities: rt foot surgical dressing not removed    Left 4th and 5th toe Bruising  Skin: No rashes or lesions. Or bruising Lymph: Cervical, supraclavicular normal. Neurologic: Grossly non-focal Pertinent Labs Lab Results CBC    Latest Ref Rng & Units 02/27/2023    5:32 AM 02/26/2023    4:29 AM 02/25/2023    4:58 AM  CBC  WBC 4.0 - 10.5 K/uL 6.0  8.8  10.0   Hemoglobin 12.0 - 15.0 g/dL 8.3  8.8  9.0   Hematocrit 36.0 - 46.0 % 26.4  28.2  27.9   Platelets 150 - 400 K/uL 128  122  106        Latest Ref Rng & Units 02/27/2023    5:32 AM 02/26/2023    4:29 AM 02/25/2023    4:58 AM  CMP  Glucose 70 - 99 mg/dL 161  79  096   BUN 8 - 23 mg/dL 23  29  33   Creatinine 0.44 - 1.00 mg/dL 0.45  4.09  8.11   Sodium 135 - 145 mmol/L 141  145  139   Potassium 3.5 - 5.1 mmol/L 3.4  3.4  3.2   Chloride 98 - 111 mmol/L 110  113  107   CO2 22 - 32 mmol/L 25  24  22    Calcium 8.9 - 10.3 mg/dL 8.1  8.3  7.8       Microbiology: Recent Results (from the past 240 hour(s))  Culture, blood (routine x 2)     Status: Abnormal   Collection Time: 02/23/23  4:00 PM   Specimen: BLOOD  Result Value Ref Range Status   Specimen Description   Final    BLOOD LEFT ANTECUBITAL Performed at [facility], [address], [place], [place] [address]    Special Requests   Final    BOTTLES DRAWN AEROBIC AND ANAEROBIC Blood Culture adequate volume Performed at [facility], [address] [place], [place] [address]    Culture  Setup Time   Final    GRAM POSITIVE COCCI IN BOTH AEROBIC AND ANAEROBIC BOTTLES CRITICAL VALUE NOTED.  VALUE IS CONSISTENT WITH PREVIOUSLY REPORTED AND CALLED VALUE. Performed at [facility], [address], [place], [place] [address]    Culture (A)  Final    GROUP B STREP(S.AGALACTIAE)ISOLATED SUSCEPTIBILITIES PERFORMED ON  PREVIOUS CULTURE WITHIN THE LAST 5 DAYS. Performed at [facility], [address], [place], [place] [address]    Report Status 02/26/2023 FINAL  Final  Culture, blood (routine x 2)     Status: Abnormal   Collection Time: 02/23/23  4:00 PM   Specimen: BLOOD  Result Value Ref Range Status   Specimen Description   Final    BLOOD BLOOD LEFT FOREARM Performed at [facility], [address] [place], [place] [address]    Special Requests   Final    BOTTLES DRAWN AEROBIC AND ANAEROBIC Blood Culture adequate volume Performed at [facility], [address] [place], [place] [address]    Culture  Setup Time   Final    GRAM POSITIVE COCCI IN BOTH AEROBIC AND ANAEROBIC BOTTLES CRITICAL RESULT CALLED TO, READ BACK BY AND VERIFIED WITHDomingo Pulse [facility] 9147 02/24/23 HNM Performed at [facility], [address] [place], [place] [address]    Culture GROUP B STREP(S.AGALACTIAE)ISOLATED (A)  Final   Report Status 02/26/2023 FINAL  Final   Organism ID, Bacteria GROUP B STREP(S.AGALACTIAE)ISOLATED  Final      Susceptibility   Group b strep(s.agalactiae)isolated - MIC*    CLINDAMYCIN <=0.25 SENSITIVE Sensitive     AMPICILLIN <=0.25 SENSITIVE Sensitive     VANCOMYCIN 0.5 SENSITIVE Sensitive     CEFTRIAXONE <=0.12 SENSITIVE Sensitive     PENICILLIN <=0.06 SENSITIVE Sensitive     *  GROUP B STREP(S.AGALACTIAE)ISOLATED  Blood Culture ID Panel (Reflexed)     Status: Abnormal   Collection Time: 02/23/23  4:00 PM  Result Value Ref Range Status   Enterococcus faecalis NOT DETECTED NOT DETECTED Final   Enterococcus Faecium NOT DETECTED NOT DETECTED Final   Listeria monocytogenes NOT DETECTED NOT DETECTED Final   Staphylococcus species NOT DETECTED NOT DETECTED Final   Staphylococcus aureus (BCID) NOT DETECTED NOT DETECTED Final   Staphylococcus epidermidis NOT DETECTED NOT DETECTED Final   Staphylococcus lugdunensis NOT DETECTED NOT DETECTED Final   Streptococcus species DETECTED (A) NOT DETECTED Final    Comment: CRITICAL RESULT CALLED TO, READ BACK BY AND VERIFIED WITH: CAROLINE CHILDS PHARMD 0732 02/24/23 HNM    Streptococcus agalactiae DETECTED (A) NOT DETECTED Final    Comment: CRITICAL RESULT CALLED TO, READ BACK BY AND VERIFIED WITH: CAROLINE CHILDS PHARMD 0732 02/24/23 HNM    Streptococcus pneumoniae NOT DETECTED NOT DETECTED Final   Streptococcus pyogenes NOT DETECTED NOT DETECTED Final   A.calcoaceticus-baumannii NOT DETECTED NOT DETECTED Final   Bacteroides fragilis NOT DETECTED NOT DETECTED Final   Enterobacterales NOT DETECTED NOT DETECTED Final   Enterobacter cloacae complex NOT DETECTED NOT DETECTED Final   Escherichia coli NOT DETECTED NOT DETECTED Final   Klebsiella aerogenes NOT DETECTED NOT DETECTED Final   Klebsiella oxytoca NOT DETECTED NOT DETECTED Final   Klebsiella pneumoniae NOT DETECTED NOT DETECTED Final   Proteus species NOT DETECTED NOT DETECTED Final   Salmonella species NOT DETECTED NOT DETECTED Final   Serratia marcescens NOT DETECTED NOT DETECTED Final   Haemophilus influenzae NOT DETECTED NOT DETECTED Final   Neisseria meningitidis NOT DETECTED NOT DETECTED Final   Pseudomonas aeruginosa NOT DETECTED NOT DETECTED Final   Stenotrophomonas maltophilia NOT DETECTED NOT DETECTED Final   Candida albicans NOT DETECTED NOT DETECTED Final    Candida auris NOT DETECTED NOT DETECTED Final   Candida glabrata NOT DETECTED NOT DETECTED Final   Candida krusei NOT DETECTED NOT DETECTED Final   Candida parapsilosis NOT  DETECTED NOT DETECTED Final   Candida tropicalis NOT DETECTED NOT DETECTED Final   Cryptococcus neoformans/gattii NOT DETECTED NOT DETECTED Final    Comment: Performed at [facility], [address], [place], [place] [address]  Urine Culture     Status: None   Collection Time: 02/23/23  5:37 PM   Specimen: Urine, Clean Catch  Result Value Ref Range Status   Specimen Description   Final    URINE, CLEAN CATCH Performed at [facility], [address] [place], [place] [address]    Special Requests   Final    Normal Performed at [facility], [address], [place], [place] [address]    Culture   Final    NO GROWTH Performed at [facility], [address] [place], [place] [address]    Report Status 02/25/2023 FINAL  Final  SARS Coronavirus 2 by RT PCR (hospital order, performed in [facility] hospital lab) *cepheid single result test* Anterior Nasal Swab     Status: None   Collection Time: 02/23/23  5:56 PM   Specimen: Anterior Nasal Swab  Result Value Ref Range Status   SARS Coronavirus 2 by RT PCR NEGATIVE NEGATIVE Final    Comment: (NOTE) SARS-CoV-2 target nucleic acids are NOT DETECTED.  The SARS-CoV-2 RNA is generally detectable in upper and lower respiratory specimens during the acute phase of infection. The lowest concentration of SARS-CoV-2 viral copies this assay can detect is 250 copies / mL. A negative result does not preclude SARS-CoV-2 infection and should not be used as the sole basis for treatment or other patient management decisions.  A negative result may occur with improper specimen collection / handling, submission of specimen other than nasopharyngeal swab, presence of viral mutation(s) within the areas targeted by this assay, and inadequate number  of viral copies (<250 copies / mL). A negative result must be combined with clinical observations, patient history, and epidemiological information.  Fact Sheet for Patients:   RoadLapTop.co.za  Fact Sheet for Healthcare Providers: http://kim-miller.com/  This test is not yet approved or  cleared by the [place] FDA and has been authorized for detection and/or diagnosis of SARS-CoV-2 by FDA under an Emergency Use Authorization (EUA).  This EUA will remain in effect (meaning this test can be used) for the duration of the COVID-19 declaration under Section 564(b)(1) of the Act, 21 U.S.C. section 360bbb-3(b)(1), unless the authorization is terminated or revoked sooner.  Performed at [facility], [address] [place], [place] [address]   Respiratory (~20 pathogens) panel by PCR     Status: None   Collection Time: 02/23/23  9:42 PM   Specimen: Nasopharyngeal Swab; Respiratory  Result Value Ref Range Status   Adenovirus NOT DETECTED NOT DETECTED Final   Coronavirus 229E NOT DETECTED NOT DETECTED Final    Comment: (NOTE) The Coronavirus on the Respiratory Panel, DOES NOT test for the novel  Coronavirus (2019 nCoV)    Coronavirus HKU1 NOT DETECTED NOT DETECTED Final   Coronavirus NL63 NOT DETECTED NOT DETECTED Final   Coronavirus OC43 NOT DETECTED NOT DETECTED Final   Metapneumovirus NOT DETECTED NOT DETECTED Final   Rhinovirus / Enterovirus NOT DETECTED NOT DETECTED Final   Influenza A NOT DETECTED NOT DETECTED Final   Influenza B NOT DETECTED NOT DETECTED Final   Parainfluenza Virus 1 NOT DETECTED NOT DETECTED Final   Parainfluenza Virus 2 NOT DETECTED NOT DETECTED Final   Parainfluenza Virus 3 NOT DETECTED NOT DETECTED Final  Parainfluenza Virus 4 NOT DETECTED NOT DETECTED Final   Respiratory Syncytial Virus NOT DETECTED NOT DETECTED Final   Bordetella pertussis NOT DETECTED NOT DETECTED Final   Bordetella  Parapertussis NOT DETECTED NOT DETECTED Final   Chlamydophila pneumoniae NOT DETECTED NOT DETECTED Final   Mycoplasma pneumoniae NOT DETECTED NOT DETECTED Final    Comment: Performed at [facility], [address], [place], [place] [address]    IMAGING RESULTS: CT abdomen  There is severe diverticulosis involving a redundant sigmoid colon. There is focal mural thickening and pericolonic infiltration along the mid sigmoid colon where an extraluminal contained collection of stool demonstrating a relatively thick rind demonstrates communication between several loops of a sigmoid colon in keeping with a chronic colo-colonic fistula, likely the result of prior diverticulitis I have personally reviewed the films ? Impression/Recommendation  Group B streptococcus bacteremia- source could be the infected 2nd toe S/p amputation of the toe She is currently on zosyn - she wants Po antibiotic- will change to cefadroxil Because of bacteremia will do high dose 1 gram PO BID adjusted to Cr cl of 32 which will be 500mg  po BID for 5 more days   2nd toe infection s/p amputation- as per podiatrist it is a therapeutic amputation- culture and pathology pending  Falls  Anemia  CKD  DM management as per primary team   ?Colo-colonic fistula chronic  ?   ID will sign off- call if needed  ________________________________________________ Discussed with patient, requesting provider and podiatrist Note:  This document was prepared using Dragon voice recognition software and may include unintentional dictation errors.

## 2023-02-26 NOTE — Transfer of Care (Signed)
Immediate Anesthesia Transfer of Care Note  Patient: Kara [name]  Procedure(s) Performed: SECOND DIGIT AMPUTATION (Right: Toe)  Patient Location: PACU  Anesthesia Type:General  Level of Consciousness: drowsy  Airway & Oxygen Therapy: Patient Spontanous Breathing and Patient connected to face mask oxygen  Post-op Assessment: Report given to RN  Post vital signs: stable  Last Vitals:  Vitals Value Taken Time  BP    Temp    Pulse 93 02/26/23 1747  Resp 19 02/26/23 1747  SpO2 99 % 02/26/23 1747  Vitals shown include unfiled device data.  Last Pain:  Vitals:   02/26/23 1641  TempSrc: Oral  PainSc: 0-No pain         Complications: No notable events documented.

## 2023-02-26 NOTE — Progress Notes (Signed)
OT Cancellation Note  Patient Details Name: Kara [name] MRN: [id number] DOB: 1946-04-12   Cancelled Treatment:    Reason Eval/Treat Not Completed: Other (comment) (Pt. is scheduled for right 2nd digit amputation today. Will monitor, and intervene when appropriate, and new orders are in place.)  Olegario Messier, MS, OTR/L 02/26/2023, 10:30 AM

## 2023-02-26 NOTE — Plan of Care (Signed)
  Problem: Education: Goal: Knowledge of General Education information will improve Description Including pain rating scale, medication(s)/side effects and non-pharmacologic comfort measures Outcome: Progressing   Problem: Activity: Goal: Risk for activity intolerance will decrease Outcome: Progressing   Problem: Safety: Goal: Ability to remain free from injury will improve Outcome: Progressing

## 2023-02-27 ENCOUNTER — Encounter: Payer: Self-pay | Admitting: Podiatry

## 2023-02-27 DIAGNOSIS — Z89421 Acquired absence of other right toe(s): Secondary | ICD-10-CM

## 2023-02-27 DIAGNOSIS — R7881 Bacteremia: Secondary | ICD-10-CM | POA: Diagnosis not present

## 2023-02-27 DIAGNOSIS — B951 Streptococcus, group B, as the cause of diseases classified elsewhere: Secondary | ICD-10-CM | POA: Diagnosis not present

## 2023-02-27 DIAGNOSIS — E11628 Type 2 diabetes mellitus with other skin complications: Secondary | ICD-10-CM

## 2023-02-27 DIAGNOSIS — L089 Local infection of the skin and subcutaneous tissue, unspecified: Secondary | ICD-10-CM

## 2023-02-27 DIAGNOSIS — A408 Other streptococcal sepsis: Secondary | ICD-10-CM | POA: Diagnosis not present

## 2023-02-27 DIAGNOSIS — M86171 Other acute osteomyelitis, right ankle and foot: Secondary | ICD-10-CM

## 2023-02-27 DIAGNOSIS — R6520 Severe sepsis without septic shock: Secondary | ICD-10-CM | POA: Diagnosis not present

## 2023-02-27 LAB — GLUCOSE, CAPILLARY
Glucose-Capillary: 128 mg/dL — ABNORMAL HIGH (ref 70–99)
Glucose-Capillary: 175 mg/dL — ABNORMAL HIGH (ref 70–99)
Glucose-Capillary: 161 mg/dL — ABNORMAL HIGH (ref 70–99)
Glucose-Capillary: 113 mg/dL — ABNORMAL HIGH (ref 70–99)
Glucose-Capillary: 152 mg/dL — ABNORMAL HIGH (ref 70–99)

## 2023-02-27 LAB — BASIC METABOLIC PANEL
Anion gap: 6 (ref 5–15)
BUN: 23 mg/dL (ref 8–23)
CO2: 25 mmol/L (ref 22–32)
Calcium: 8.1 mg/dL — ABNORMAL LOW (ref 8.9–10.3)
Chloride: 110 mmol/L (ref 98–111)
Creatinine, Ser: 1.67 mg/dL — ABNORMAL HIGH (ref 0.44–1.00)
GFR, Estimated: 32 mL/min — ABNORMAL LOW (ref >60)
Glucose, Bld: 135 mg/dL — ABNORMAL HIGH (ref 70–99)
Potassium: 3.4 mmol/L — ABNORMAL LOW (ref 3.5–5.1)
Sodium: 141 mmol/L (ref 135–145)

## 2023-02-27 LAB — PHOSPHORUS: Phosphorus: 3.3 mg/dL (ref 2.5–4.6)

## 2023-02-27 LAB — MAGNESIUM: Magnesium: 2 mg/dL (ref 1.7–2.4)

## 2023-02-27 LAB — CBC
HCT: 26.4 % — ABNORMAL LOW (ref 36.0–46.0)
Hemoglobin: 8.3 g/dL — ABNORMAL LOW (ref 12.0–15.0)
MCH: 27.2 pg (ref 26.0–34.0)
MCHC: 31.4 g/dL (ref 30.0–36.0)
MCV: 86.6 fL (ref 80.0–100.0)
Platelets: 128 10*3/uL — ABNORMAL LOW (ref 150–400)
RBC: 3.05 MIL/uL — ABNORMAL LOW (ref 3.87–5.11)
RDW: 17.5 % — ABNORMAL HIGH (ref 11.5–15.5)
WBC: 6 10*3/uL (ref 4.0–10.5)
nRBC: 0 % (ref 0.0–0.2)

## 2023-02-27 MED ORDER — ACETAMINOPHEN 325 MG PO TABS
650.0000 mg | ORAL_TABLET | Freq: Three times a day (TID) | ORAL | Status: DC
  Administered 2023-02-27 – 2023-02-28 (×3): 650 mg via ORAL
  Filled 2023-02-27 (×3): qty 2

## 2023-02-27 MED ORDER — CEFADROXIL 500 MG PO CAPS
500.0000 mg | ORAL_CAPSULE | Freq: Two times a day (BID) | ORAL | Status: DC
  Administered 2023-02-27 – 2023-02-28 (×2): 500 mg via ORAL
  Filled 2023-02-27 (×2): qty 1

## 2023-02-27 MED ORDER — HYDROMORPHONE HCL 2 MG PO TABS
2.0000 mg | ORAL_TABLET | ORAL | Status: DC | PRN
  Administered 2023-02-27 – 2023-02-28 (×4): 2 mg via ORAL
  Filled 2023-02-27 (×5): qty 1

## 2023-02-27 MED ORDER — TRAMADOL-ACETAMINOPHEN 37.5-325 MG PO TABS
1.0000 | ORAL_TABLET | Freq: Four times a day (QID) | ORAL | Status: DC | PRN
  Administered 2023-02-27: 1 via ORAL
  Filled 2023-02-27 (×2): qty 1

## 2023-02-27 NOTE — Progress Notes (Signed)
Physical Therapy Treatment Patient Details Name: Kara [name] MRN: [id number] DOB: 03-18-1946 Today's Date: 02/27/2023   History of Present Illness 77 y.o. female with medical history significant of HTN, HDL, DM, diabetic neuropathy, dCHF, CKD-4, anemia, kidney stone, chronic back pain, right breast cancer-DCIS, essential tremor, chronic diabetic foot ulcer (right second toe) who presents with abdominal pain, fever, and 2 falls. Patient is s/p second toe amputation R foot.    PT Comments  Patient received in bed, she initially declined any PT, with encouragement she agrees. Patient reports her husband is in the ED. She is mod I with bed mobility. She requires supervision for sit to stand. She ambulated 100 feet with RW. Occasional unsteadiness, requiring min A. She will continue to benefit from skilled PT to improve safety and functional independence.    If plan is discharge home, recommend the following: A little help with walking and/or transfers;A little help with bathing/dressing/bathroom;Assistance with cooking/housework;Assistance with feeding;Help with stairs or ramp for entrance;Assist for transportation   Can travel by private vehicle     Yes  Equipment Recommendations  None recommended by PT    Recommendations for Other Services       Precautions / Restrictions Precautions Precautions: Fall Restrictions Weight Bearing Restrictions: Yes RLE Weight Bearing: Weight bearing as tolerated Other Position/Activity Restrictions: WBAT in post op shoe     Mobility  Bed Mobility Overal bed mobility: Modified Independent Bed Mobility: Supine to Sit, Sit to Supine     Supine to sit: Used rails, HOB elevated Sit to supine: Used rails        Transfers Overall transfer level: Needs assistance Equipment used: Rolling walker (2 wheels) Transfers: Sit to/from Stand Sit to Stand: Supervision           General transfer comment: Patient able to stand from bed and low toilet  with supervision    Ambulation/Gait Ambulation/Gait assistance: Contact guard assist Gait Distance (Feet): 100 Feet Assistive device: Rolling walker (2 wheels) Gait Pattern/deviations: Step-to pattern, Decreased step length - right, Decreased step length - left, Decreased stride length Gait velocity: decreased     General Gait Details: Patient with unsteady gait requiring cga. She had a few minor lob during mobility.   Stairs             Wheelchair Mobility     Tilt Bed    Modified Rankin (Stroke Patients Only)       Balance Overall balance assessment: Needs assistance Sitting-balance support: Feet supported Sitting balance-Leahy Scale: Normal     Standing balance support: Bilateral upper extremity supported, During functional activity, Reliant on assistive device for balance Standing balance-Leahy Scale: Fair Standing balance comment: fair- requires UE support and CGA- min assist                            Cognition Arousal: Alert Behavior During Therapy: WFL for tasks assessed/performed Overall Cognitive Status: Within Functional Limits for tasks assessed                                 General Comments: Patient sad, reports her husband is in the ED        Exercises      General Comments        Pertinent Vitals/Pain Pain Assessment Pain Assessment: Faces Faces Pain Scale: Hurts a little bit Pain Location: R foot Pain Descriptors / Indicators:  Discomfort Pain Intervention(s): Monitored during session, Premedicated before session    Home Living                          Prior Function            PT Goals (current goals can now be found in the care plan section) Acute Rehab PT Goals Patient Stated Goal: to get back home to husband. PT Goal Formulation: With patient Time For Goal Achievement: 03/11/23 Potential to Achieve Goals: Good Progress towards PT goals: Progressing toward goals     Frequency    Min 1X/week      PT Plan      Co-evaluation              AM-PAC PT "6 Clicks" Mobility   Outcome Measure  Help needed turning from your back to your side while in a flat bed without using bedrails?: None Help needed moving from lying on your back to sitting on the side of a flat bed without using bedrails?: None Help needed moving to and from a bed to a chair (including a wheelchair)?: A Little Help needed standing up from a chair using your arms (e.g., wheelchair or bedside chair)?: A Little Help needed to walk in hospital room?: A Little Help needed climbing 3-5 steps with a railing? : A Lot 6 Click Score: 19    End of Session   Activity Tolerance: Patient tolerated treatment well Patient left: in bed;with call bell/phone within reach;with bed alarm set Nurse Communication: Mobility status PT Visit Diagnosis: Unsteadiness on feet (R26.81);Other abnormalities of gait and mobility (R26.89);Muscle weakness (generalized) (M62.81);History of falling (Z91.81);Difficulty in walking, not elsewhere classified (R26.2)     Time: 1246-1310 PT Time Calculation (min) (ACUTE ONLY): 24 min  Charges:    $Gait Training: 8-22 mins $Therapeutic Activity: 8-22 mins PT General Charges $$ ACUTE PT VISIT: 1 Visit                     Clarise Chacko, PT, GCS 02/27/23,1:25 PM

## 2023-02-27 NOTE — Evaluation (Signed)
Occupational Therapy Re-Evaluation Patient Details Name: Kara [name] MRN: [id number] DOB: October 08, 1945 Today's Date: 02/27/2023   History of Present Illness 77 y.o. female with medical history significant of HTN, HDL, DM, diabetic neuropathy, dCHF, CKD-4, anemia, kidney stone, chronic back pain, right breast cancer-DCIS, essential tremor, chronic diabetic foot ulcer (right second toe) who presents with abdominal pain, fever, and 2 falls. Patient is s/p second toe amputation R foot.   Clinical Impression   Pt seen for OT re-evaluation this date following toe amputation. Pt tearful, sharing that her spouse is in the ED and she is worried about him. Emotional support and active listening provided. Educated in PLB and pt able to return demo. Pt required supervision for bed mobility and SBA to stand from EOB with RW. Pt ambulated in the room with RW to bathroom with SBA-CGA and completed transfer to reg height toilet with use of grab bar and holding onto the side of the wall "like I do it at home." Mod indep with pericare. Pt required MAX A for donning post-op shoes. Pt continues to benefit from skilled OT services. Hopeful to progress further prior to discharge in hopes to return straight home. Will continue to progress.        If plan is discharge home, recommend the following: A little help with walking and/or transfers;A lot of help with bathing/dressing/bathroom;Assistance with cooking/housework;Assist for transportation;Help with stairs or ramp for entrance    Functional Status Assessment  Patient has had a recent decline in their functional status and demonstrates the ability to make significant improvements in function in a reasonable and predictable amount of time.  Equipment Recommendations  None recommended by OT    Recommendations for Other Services       Precautions / Restrictions Precautions Precautions: Fall Restrictions Weight Bearing Restrictions: Yes RLE Weight Bearing:  Weight bearing as tolerated Other Position/Activity Restrictions: WBAT in post op shoe      Mobility Bed Mobility Overal bed mobility: Modified Independent Bed Mobility: Supine to Sit, Sit to Supine     Supine to sit: Used rails, HOB elevated Sit to supine: Used rails        Transfers Overall transfer level: Needs assistance Equipment used: Rolling walker (2 wheels) Transfers: Sit to/from Stand Sit to Stand: Supervision           General transfer comment: Patient able to stand from bed and low toilet with supervision and increased effort/time      Balance Overall balance assessment: Needs assistance Sitting-balance support: Feet supported Sitting balance-Leahy Scale: Normal     Standing balance support: Bilateral upper extremity supported, During functional activity, Reliant on assistive device for balance Standing balance-Leahy Scale: Fair                             ADL either performed or assessed with clinical judgement   ADL Overall ADL's : Needs assistance/impaired                         Toilet Transfer: Grab bars;Regular Toilet;Rolling walker (2 wheels);Ambulation;Supervision/safety   Toileting- Clothing Manipulation and Hygiene: Supervision/safety;Sit to/from stand       Functional mobility during ADLs: Contact guard assist;Rolling walker (2 wheels)       Vision         Perception         Praxis         Pertinent Vitals/Pain Pain Assessment  Pain Assessment: 0-10 Pain Score: 3  Pain Location: R foot Pain Descriptors / Indicators: Discomfort, Aching Pain Intervention(s): Monitored during session, Repositioned, Patient requesting pain meds-RN notified     Extremity/Trunk Assessment Upper Extremity Assessment Upper Extremity Assessment: Generalized weakness   Lower Extremity Assessment Lower Extremity Assessment: Generalized weakness       Communication Communication Communication: No apparent  difficulties Cueing Techniques: Verbal cues   Cognition Arousal: Alert Behavior During Therapy: WFL for tasks assessed/performed Overall Cognitive Status: Within Functional Limits for tasks assessed                                 General Comments: Pt tearful and endorsed feeling stressed and worried just recently having found out her spouse is in the ED     General Comments       Exercises Other Exercises Other Exercises: Pt educated in PLB and strategies to promote improved stress mgt   Shoulder Instructions      Home Living Family/patient expects to be discharged to:: Private residence Living Arrangements: Spouse/significant other Available Help at Discharge: Family;Available 24 hours/day Type of Home: House Home Access: Stairs to enter [employer] of Steps: 1 Entrance Stairs-Rails: None Home Layout: One level     Bathroom Shower/Tub: Chief Strategy Officer: Standard     Home Equipment: Cane - single point;Grab bars - tub/shower;BSC/3in1;Rolling Environmental consultant (2 wheels)   Additional Comments: son nearby available PRN, husband at home 24/7 but has early dementia      Prior Functioning/Environment Prior Level of Function : Needs assist             Mobility Comments: Was using RW PRN until Sunday am when her R knee collapsed and she couldn't get up. Went to [employer] Saturday morning and held onto the grocery cart. ADLs Comments: husband assists with LB dressing at baseline 2/2 pt being unable to reach feet. Pt sponge bathes every day and only gets into tub shower once per week 2/2 mobility deficits. Pt and husband are retired. Pt enjoys cooking. Husband is assisting with most housework and driving. Patient is nervous about husband being home by himself or navigating in the community alone. Pt sleeps in a recliner 2/2 back pain.        OT Problem List: Decreased strength;Pain;Impaired balance (sitting and/or standing);Decreased knowledge  of use of DME or AE;Obesity      OT Treatment/Interventions: Self-care/ADL training;Therapeutic exercise;Therapeutic activities;DME and/or AE instruction;Patient/family education;Balance training    OT Goals(Current goals can be found in the care plan section) Acute Rehab OT Goals Patient Stated Goal: get better and go home OT Goal Formulation: With patient Time For Goal Achievement: 03/13/23 Potential to Achieve Goals: Good ADL Goals Pt Will Perform Lower Body Dressing: with adaptive equipment;with modified independence;sit to/from stand  OT Frequency: Min 1X/week    Co-evaluation              AM-PAC OT "6 Clicks" Daily Activity     Outcome Measure Help from another person eating meals?: None Help from another person taking care of personal grooming?: None Help from another person toileting, which includes using toliet, bedpan, or urinal?: A Little Help from another person bathing (including washing, rinsing, drying)?: A Little Help from another person to put on and taking off regular upper body clothing?: None Help from another person to put on and taking off regular lower body clothing?: A Little 6 Click  Score: 21   End of Session Equipment Utilized During Treatment: Rolling walker (2 wheels);Other (comment) (post op shoe) Nurse Communication: Mobility status;Patient requests pain meds  Activity Tolerance: Patient tolerated treatment well Patient left: in bed;with call bell/phone within reach;with nursing/sitter in room  OT Visit Diagnosis: Muscle weakness (generalized) (M62.81);History of falling (Z91.81);Unsteadiness on feet (R26.81);Pain Pain - Right/Left: Right Pain - part of body: Ankle and joints of foot                Time: 1035-1055 OT Time Calculation (min): 20 min Charges:  OT General Charges $OT Visit: 1 Visit OT Evaluation $OT Re-eval: 1 Re-eval OT Treatments $Self Care/Home Management : 8-22 mins  Arman Filter., MPH, MS, OTR/L ascom  [phone] 02/27/23, 2:05 PM

## 2023-02-27 NOTE — Progress Notes (Signed)
  Subjective:  Patient ID: Kara [name], female    DOB: 05-31-1945,  MRN: [id number]  A 77 y.o. female with right second toe osteomyelitis status post second digit amputation postop day 1.  She states she is doing okay minimal pain.  Resting comfortably at bedside.  No nausea fever chills vomiting Objective:   Vitals:   02/27/23 0633 02/27/23 0756  BP: 139/63 (!) 135/48  Pulse: 76 77  Resp: 18 16  Temp: 97.9 F (36.6 C) 97.7 F (36.5 C)  SpO2: 92% 94%   General AA&O x3. Normal mood and affect.  Vascular Dorsalis pedis and posterior tibial pulses 2/4 bilat. Brisk capillary refill to all digits. Pedal hair present.  Neurologic Epicritic sensation grossly intact.  Dermatologic Bandages clean dry intact.  No strikethrough noted no calf pain noted motor or sensory functions are intact.   Orthopedic: MMT 5/5 in dorsiflexion, plantarflexion, inversion, and eversion. Normal joint ROM without pain or crepitus.    Assessment & Plan:  Patient was evaluated and treated and all questions answered.  Right second toe osteomyelitis status post right second digit amputation -All questions and concerns were discussed with the patient in extensive detail -Patient is okay to be discharged from podiatric standpoint.  No dressing change until follow-up follow-up 1 week from discharge with Dr. Lilian Kapur -10 days of doxycycline -Weightbearing as tolerated with surgical shoe  Left foot soft tissue contusion without acute fracture -Patient can be weightbearing as tolerated to the left side with a surgical shoe if needed.  No restrictions.  Candelaria Stagers, DPM  Accessible via secure chat for questions or concerns.

## 2023-02-27 NOTE — Plan of Care (Signed)

## 2023-02-27 NOTE — Progress Notes (Signed)
[facility] Progress Note  Patient: Kara [name]    EAV:[id number]  DOA: 02/23/2023     Date of Service: the patient was seen and examined on 02/27/2023  Chief Complaint  Patient presents with   Fall   Brief hospital course: LYNZE REDDY is a 77 y.o. female with medical history significant of HTN, HDL, DM, diabetic neuropathy, dCHF, CKD-4, anemia, kidney stone, chronic back pain, right breast cancer-DCIS, essential tremor, chronic diabetic foot ulcer (right second toe) who presents with abdominal pain, fever, fall.   Patient states that she has a remote history of abdominal surgery due to abdominal abscess, but does not remember what caused her abdominal abscess.  She has chronic lower abdominal pain for several months, which has been intermittent, sometimes becomes severe recently.  Currently her lower abdominal pain is mild, aching, nonradiating.  Not associated with nausea, vomiting or diarrhea.  Patient has fever and chills.  Patient denies chest pain, cough, SOB.  No symptoms of UTI. Pt has a chronic right second toe ulcer and following up with Dr. Allena Katz of podiatry.  Patient has generalized weakness.  She states that she has had 2 falls from low height. Pt fell off the toilet last night and slid out of a chair today.  Denies loss of consciousness or hitting her head.  She has pain in the low back and right hip and right knee.    Data reviewed independently and ED Course: pt was found to have WBC 10.6, lactic acid 1.9, negative urinalysis, negative COVID PCR, worsening renal function with creatinine 1.91, BUN 37, GFR 27 (recent baseline creatinine 1.35), temperature 103.2, blood pressure 111/89, heart rate 138 --> 95, RR 36 --> 23, oxygen sat 95% on room air.  Chest x-ray negative.  CT head negative.  X-ray of right and lower back and right hip/pelvis negative for acute injury. Patient is admitted to telemetry bed as inpatient.   CT scan of chest/abdomen/pelvis: 1. No acute  intrathoracic or intra-abdominal pathology identified. 2. Mild coronary artery calcification. 3. Morphologic changes in keeping with pulmonary arterial hypertension. 4. Mild hepatomegaly and hepatic steatosis. 5. Severe sigmoid diverticulosis. 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 An infiltrative malignancy is less likely, but not completely excluded. Correlation with colonoscopy may be helpful for further evaluation.   Assessment and Plan: Sepsis: met criteria w/ leukocytosis, tachycardia, tachypnea, fever and bacteremia likely secondary to right 2nd toe osteomyelitis. Continue on IV zosyn.  Ultracet, tylenol prn for pain. Zofran prn nausea/vomiting. Continue on IVFs   Bacteremia: blood cxs growing streg agalactiae, pansensitive.  Likely secondary to 2nd toe osteomyelitis. S/p IV zosyn. 11/7 started cefadroxil 500 mg p.o. twice daily Repeat blood cxs NGTD <12 hrs D/c on cefadroxil 500 mg p.o. twice daily for 7 days as per ID  Osteomyelitis of right 2nd toe:  MRI positive for second toe osteomyelitis and multiple other findings Continue on IV zosyn.  Podiatry consulted, s/p amputation of second toe done on 11/6, recommended no dressing change until follow-up in 1 week as an outpatient after discharge and continue doxycycline for 10 days.  Weightbearing as tolerated with surgical shoe. ID consulted, Rec to D/c on cefadroxil 500 mg p.o. twice daily for 7 days   # Fall at home, initial encounter: CT  head negative.  No acute injury on imaging of x-ray of right knee and right hip/pelvis. OT recs SNF. PT consulted    # DM2: HbA1c 8.3, poorly controlled. Continue on glargine, SSI w/ accuchecks  # Hypokalemia:  potassium repleted. # HLD: continue on statin  # Chronic diastolic CHF: echo on 12/25/2021 showd EF 60-85% with grade I diastolic dysfunction. Trace leg edema, but no pulm edema chest x-ray, BNP normal 63.  CHF is compensated.Holding lasix. Monitor I/Os  # CKDIV: baseline Cr 1.35-1.6. Cr is trending up slightly from day prior. Avoid nephrotoxic meds  # IDA: continue on iron supplement  # Chronic bilateral low back pain with left-sided sciatica: continue on home dose of ultracet prn  # Thrombocytopenia: etiology unclear. Labile  # Diverticulosis: Severe sigmoid diverticulosis. 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 An infiltrative malignancy is less likely, but not completely excluded. Correlation with colonoscopy may be helpful for further evaluation. Colonoscopy can be done as an outpatient   # Chronic diarrhea, patient uses Imodium at home, as prn As needed resume Imodium as needed Started probiotics  Obesity class II Body mass index is 37.85 kg/m.  Interventions: Calorie restricted diet and daily exercise advised to lose body weight.  Lifestyle modification discussed.   Diet: Carb modified diet DVT Prophylaxis: Subcutaneous Heparin    Advance goals of care discussion: Full code  Family Communication: family was present at bedside, at the time of interview.  The pt provided permission to discuss medical plan with the family. Opportunity was given to ask question and all questions were answered satisfactorily.   Disposition:  Pt is from Home, admitted with sepsis due to right second toe osteomyelitis, still on IV antibiotics and needs surgical intervention per podiatry, ID consulted, which precludes a  safe discharge. Discharge to Home, when cleared for podiatry and ID.  Subjective: No significant events overnight, patient feels little bit throbbing in the foot at surgical site, no any other complaints. Patient was upset as her husband got sick and he was brought into the ED Counseling done and reassurance given.    Physical Exam: General: NAD, lying comfortably Appear in no distress, affect appropriate Eyes: PERRLA ENT: Oral Mucosa Clear, moist  Neck: no JVD,  Cardiovascular: S1 and S2 Present, no Murmur,  Respiratory: good respiratory effort, Bilateral Air entry equal and Decreased, no Crackles, no wheezes Abdomen: Bowel Sound present, Soft and no tenderness,  Skin: no rashes Extremities: no edema, no calf tenderness, right 2nd Toe s/p surgical excision, dressing CDI.   Neurologic: without any new focal findings Gait not checked due to patient safety concerns  Vitals:   02/26/23 2028 02/27/23 0500 02/27/23 0633 02/27/23 0756  BP: 130/70  139/63 (!) 135/48  Pulse: 93  76 77  Resp: 19  18 16   Temp: 98.2 F (36.8 C)  97.9 F (36.6 C) 97.7 F (36.5 C)  TempSrc:    Oral  SpO2: 95%  92% 94%  Weight:  87.9 kg    Height:        Intake/Output Summary (Last 24 hours) at 02/27/2023 1541 Last data filed at 02/27/2023 0300 Gross per 24 hour  Intake 515.51 ml  Output --  Net 515.51 ml   Filed Weights   02/25/23 0250 02/27/23 0500  Weight: 84.4 kg 87.9 kg    Data Reviewed: I have personally reviewed and interpreted daily labs, tele strips, imagings as discussed above. I reviewed all nursing notes, pharmacy notes, vitals, pertinent old records I have discussed plan of care as described above with RN and patient/family.  CBC: Recent Labs  Lab 02/23/23 1602 02/24/23 0541 02/25/23 0458 02/26/23 0429 02/27/23 0532  WBC 10.6* 11.5* 10.0 8.8 6.0  NEUTROABS 9.8*  --   --   --   --   HGB 10.5* 8.6* 9.0* 8.8* 8.3*  HCT 33.3* 27.6* 27.9* 28.2* 26.4*  MCV 86.9 89.0 85.6 86.5  86.6  PLT 134* 107* 106* 122* 128*   Basic Metabolic Panel: Recent Labs  Lab 02/23/23 1602 02/24/23 0541 02/25/23 0458 02/26/23 0429 02/27/23 0532  NA 134* 138 139 145 141  K 3.9 3.3* 3.2* 3.4* 3.4*  CL 96* 103 107 113* 110  CO2 26 25 22 24 25   GLUCOSE 240* 218* 141* 79 135*  BUN 37* 31* 33* 29* 23  CREATININE 1.91* 1.84* 1.87* 1.74* 1.67*  CALCIUM 8.7* 7.9* 7.8* 8.3* 8.1*  MG  --   --   --  2.1 2.0  PHOS  --   --   --  2.4* 3.3    Studies: No results found.  Scheduled Meds:  acetaminophen  1,000 mg Oral Once   ferrous gluconate  324 mg Oral Daily   heparin  5,000 Units Subcutaneous Q8H   insulin aspart  0-5 Units Subcutaneous QHS   insulin aspart  0-9 Units Subcutaneous TID WC   insulin glargine-yfgn  20 Units Subcutaneous QHS   pantoprazole  40 mg Oral Daily   pregabalin  100 mg Oral BID   rosuvastatin  20 mg Oral Daily   saccharomyces boulardii  250 mg Oral BID   Continuous Infusions:  sodium chloride 75 mL/hr at 02/26/23 2127   piperacillin-tazobactam (ZOSYN)  IV 3.375 g (02/27/23 0520)   PRN Meds: acetaminophen, hydrALAZINE, loperamide, morphine injection, ondansetron (ZOFRAN) IV, traMADol-acetaminophen  Time spent: 35 minutes  Author: Gillis Santa. MD [facility] 02/27/2023 3:41 PM  To reach On-call, see care teams to locate the attending and reach out to them via [email or web address]. If 7PM-7AM, please contact night-coverage If you still have difficulty reaching the attending provider, please page the [facility] (Director on Call) for [facility] on amion for assistance.

## 2023-02-27 NOTE — Progress Notes (Signed)
At bedside to restart PIV. Patient would like to check with MD to see if she can leave the IV out and get the med in pill form. RN made aware. Limited venous access with right arm restricted and left arm infiltrated up to elbow.

## 2023-02-28 DIAGNOSIS — M869 Osteomyelitis, unspecified: Secondary | ICD-10-CM

## 2023-02-28 LAB — SURGICAL PATHOLOGY

## 2023-02-28 LAB — BASIC METABOLIC PANEL
Anion gap: 6 (ref 5–15)
BUN: 23 mg/dL (ref 8–23)
CO2: 24 mmol/L (ref 22–32)
Calcium: 8.1 mg/dL — ABNORMAL LOW (ref 8.9–10.3)
Chloride: 108 mmol/L (ref 98–111)
Creatinine, Ser: 1.73 mg/dL — ABNORMAL HIGH (ref 0.44–1.00)
GFR, Estimated: 30 mL/min — ABNORMAL LOW (ref >60)
Glucose, Bld: 140 mg/dL — ABNORMAL HIGH (ref 70–99)
Potassium: 3.2 mmol/L — ABNORMAL LOW (ref 3.5–5.1)
Sodium: 138 mmol/L (ref 135–145)

## 2023-02-28 LAB — PHOSPHORUS: Phosphorus: 3.8 mg/dL (ref 2.5–4.6)

## 2023-02-28 LAB — CBC
HCT: 26 % — ABNORMAL LOW (ref 36.0–46.0)
Hemoglobin: 8 g/dL — ABNORMAL LOW (ref 12.0–15.0)
MCH: 26.6 pg (ref 26.0–34.0)
MCHC: 30.8 g/dL (ref 30.0–36.0)
MCV: 86.4 fL (ref 80.0–100.0)
Platelets: 133 10*3/uL — ABNORMAL LOW (ref 150–400)
RBC: 3.01 MIL/uL — ABNORMAL LOW (ref 3.87–5.11)
RDW: 17.4 % — ABNORMAL HIGH (ref 11.5–15.5)
WBC: 5.4 10*3/uL (ref 4.0–10.5)
nRBC: 0 % (ref 0.0–0.2)

## 2023-02-28 LAB — MAGNESIUM: Magnesium: 2 mg/dL (ref 1.7–2.4)

## 2023-02-28 LAB — GLUCOSE, CAPILLARY
Glucose-Capillary: 117 mg/dL — ABNORMAL HIGH (ref 70–99)
Glucose-Capillary: 142 mg/dL — ABNORMAL HIGH (ref 70–99)

## 2023-02-28 MED ORDER — POTASSIUM CHLORIDE CRYS ER 20 MEQ PO TBCR
40.0000 meq | EXTENDED_RELEASE_TABLET | Freq: Once | ORAL | Status: AC
  Administered 2023-02-28: 40 meq via ORAL
  Filled 2023-02-28: qty 2

## 2023-02-28 MED ORDER — CEFADROXIL 500 MG PO CAPS: 500.0000 mg | ORAL_CAPSULE | Freq: Two times a day (BID) | ORAL | 0 refills | Status: AC

## 2023-02-28 MED ORDER — ACETAMINOPHEN 325 MG PO TABS: 650.0000 mg | ORAL_TABLET | Freq: Three times a day (TID) | ORAL | Status: AC

## 2023-02-28 MED ORDER — HYDROMORPHONE HCL 2 MG PO TABS: 2.0000 mg | ORAL_TABLET | Freq: Four times a day (QID) | ORAL | 0 refills | Status: AC | PRN

## 2023-02-28 NOTE — Progress Notes (Signed)
Occupational Therapy Treatment Patient Details Name: CHRYS VANDERHOFF MRN: [id number] DOB: 11-09-1945 Today's Date: 02/28/2023   History of present illness 77 y.o. female with medical history significant of HTN, HDL, DM, diabetic neuropathy, dCHF, CKD-4, anemia, kidney stone, chronic back pain, right breast cancer-DCIS, essential tremor, chronic diabetic foot ulcer (right second toe) who presents with abdominal pain, fever, and 2 falls. Patient is s/p second toe amputation R foot.   OT comments  Pt seen for OT tx. Family present and pt in recliner, agreeable to session. Pt completed ADL mobility with supervision in room, bathroom, and in hallway using RW and only PRN VC for RW mgt to improve posture and minimize R foot from hitting the RW as she tends to externally rotate her RLE when walking. Pt completed grooming tasks at the sink  and toilet transfer with supervision. Mod indep with pericare from seated position. Pt returned to bed after session. Pt/family educated in AE/DME for bathroom to improve safety/indep. Pt progressing well towards goals. Continues to benefit from skilled OT services while hospitalized.       If plan is discharge home, recommend the following:  A little help with walking and/or transfers;Assistance with cooking/housework;Assist for transportation;Help with stairs or ramp for entrance;A little help with bathing/dressing/bathroom   Equipment Recommendations  None recommended by OT    Recommendations for Other Services      Precautions / Restrictions Precautions Precautions: Fall Restrictions Weight Bearing Restrictions: Yes RLE Weight Bearing: Weight bearing as tolerated Other Position/Activity Restrictions: WBAT in post op shoe       Mobility Bed Mobility Overal bed mobility: Needs Assistance Bed Mobility: Sit to Supine       Sit to supine: Contact guard assist   General bed mobility comments: CGA for BLE mgt    Transfers Overall transfer level:  Needs assistance Equipment used: Rolling walker (2 wheels) Transfers: Sit to/from Stand Sit to Stand: Supervision           General transfer comment: STS from recliner and std toilet     Balance Overall balance assessment: Mild deficits observed, not formally tested                                         ADL either performed or assessed with clinical judgement   ADL Overall ADL's : Needs assistance/impaired     Grooming: Oral care;Standing;Supervision/safety               Lower Body Dressing: Sitting/lateral leans;Maximal assistance Lower Body Dressing Details (indicate cue type and reason): MAX A for post-op shoes Toilet Transfer: Grab bars;Regular Toilet;Rolling walker (2 wheels);Ambulation;Supervision/safety   Toileting- Clothing Manipulation and Hygiene: Modified independent       Functional mobility during ADLs: Supervision/safety;Rolling walker (2 wheels)      Extremity/Trunk Assessment              Vision       Perception     Praxis      Cognition Arousal: Alert Behavior During Therapy: WFL for tasks assessed/performed Overall Cognitive Status: Within Functional Limits for tasks assessed                                          Exercises Other Exercises Other Exercises: Pt/family educated in AE/DME to improve  safety/indep with toileting.    Shoulder Instructions       General Comments      Pertinent Vitals/ Pain       Pain Assessment Pain Assessment: 0-10 Pain Score: 6  Pain Location: 6/10 R hip, 3/10 R foot Pain Descriptors / Indicators: Aching Pain Intervention(s): Monitored during session, Premedicated before session, Repositioned  Home Living                                          Prior Functioning/Environment              Frequency  Min 1X/week        Progress Toward Goals  OT Goals(current goals can now be found in the care plan section)  Progress  towards OT goals: Progressing toward goals  Acute Rehab OT Goals Patient Stated Goal: get better and go home OT Goal Formulation: With patient Time For Goal Achievement: 03/13/23 Potential to Achieve Goals: Good  Plan      Co-evaluation                 AM-PAC OT "6 Clicks" Daily Activity     Outcome Measure   Help from another person eating meals?: None Help from another person taking care of personal grooming?: None Help from another person toileting, which includes using toliet, bedpan, or urinal?: None Help from another person bathing (including washing, rinsing, drying)?: A Little Help from another person to put on and taking off regular upper body clothing?: None Help from another person to put on and taking off regular lower body clothing?: A Little 6 Click Score: 22    End of Session Equipment Utilized During Treatment: Rolling walker (2 wheels)  OT Visit Diagnosis: Muscle weakness (generalized) (M62.81);History of falling (Z91.81);Unsteadiness on feet (R26.81);Pain Pain - Right/Left: Right Pain - part of body: Ankle and joints of foot;Hip   Activity Tolerance Patient tolerated treatment well   Patient Left in bed;with call bell/phone within reach;with family/visitor present   Nurse Communication          Time: 1610-9604 OT Time Calculation (min): 18 min  Charges: OT General Charges $OT Visit: 1 Visit OT Treatments $Self Care/Home Management : 8-22 mins  Arman Filter., MPH, MS, OTR/L ascom [phone] 02/28/23, 10:35 AM

## 2023-02-28 NOTE — Plan of Care (Signed)

## 2023-02-28 NOTE — Discharge Summary (Signed)
[facility] Discharge Summary   Patient: Kara [name]:[id number]  PCP: [name], NP  Date of admission: 02/23/2023   Date of discharge:  02/28/2023     Discharge Diagnoses:  Principal Problem:   Toe osteomyelitis, right (HCC) Active Problems:   Sepsis (HCC)   Fistula of sigmoid colon   Fall at home, initial encounter   Type II diabetes mellitus with renal manifestations (HCC)   HLD (hyperlipidemia)   Chronic diastolic CHF (congestive heart failure) (HCC)   CKD (chronic kidney disease), stage IV (HCC)   Iron deficiency anemia   Diabetic foot ulcer_ right second toe   Essential hypertension   Chronic bilateral low back pain with left-sided sciatica   Contusion of lesser toe of left foot without damage to nail   Acute osteomyelitis of toe, right (HCC)   History of amputation of lesser toe of right foot (HCC)   Admitted From: Home Disposition:  Home with HHPT  Recommendations for Outpatient Follow-up:  Follow with PCP in 1 week.  Repeat CBC and BMP after 1 week to check electrolytes and renal functions. Follow-up with podiatry in 1 week Follow-up with GI for colonoscopy as an outpatient in 1 to 2 weeks Follow up LABS/TEST:  As above   Follow-up Information     Abernathy, Alyssa, NP Follow up in 1 week(s).   Specialty: Nurse Practitioner Contact information: [address] [place] [address] [phone]         Edwin Cap, DPM Follow up in 1 week(s).   Specialty: Podiatry Contact information: [address] [place] [address] [phone]                Diet recommendation: Cardiac and Carb modified diet  Activity: The patient is advised to gradually reintroduce usual activities, as tolerated  Discharge Condition: stable  Code Status: Full code   History of present illness: As per the H and P dictated on admission Hospital Course:  [name] is a 77 y.o. female with medical history significant of HTN, HDL, DM,  diabetic neuropathy, dCHF, CKD-4, anemia, kidney stone, chronic back pain, right breast cancer-DCIS, essential tremor, chronic diabetic foot ulcer (right second toe) who presents with abdominal pain, fever, fall.   Patient states that she has a remote history of abdominal surgery due to abdominal abscess, but does not remember what caused her abdominal abscess.  She has chronic lower abdominal pain for several months, which has been intermittent, sometimes becomes severe recently.  Currently her lower abdominal pain is mild, aching, nonradiating.  Not associated with nausea, vomiting or diarrhea.  Patient has fever and chills.  Patient denies chest pain, cough, SOB.  No symptoms of UTI. Pt has a chronic right second toe ulcer and following up with Dr. Allena Katz of podiatry.  Patient has generalized weakness.  She states that she has had 2 falls from low height. Pt fell off the toilet last night and slid out of a chair today.  Denies loss of consciousness or hitting her head.  She has pain in the low back and right hip and right knee.    Data reviewed independently and ED Course: pt was found to have WBC 10.6, lactic acid 1.9, negative urinalysis, negative COVID PCR, worsening renal function with creatinine 1.91, BUN 37, GFR 27 (recent baseline creatinine 1.35), temperature 103.2, blood pressure 111/89, heart rate 138 --> 95, RR 36 --> 23, oxygen sat 95% on room air.  Chest x-ray negative.  CT head negative.  X-ray of right and lower back and right hip/pelvis negative for acute injury. Patient is admitted to telemetry bed as inpatient.   CT scan of chest/abdomen/pelvis: 1. No acute intrathoracic or intra-abdominal pathology identified. 2. Mild coronary artery calcification. 3. Morphologic changes in keeping with pulmonary arterial hypertension. 4. Mild hepatomegaly and hepatic steatosis. 5. Severe sigmoid diverticulosis. Focal mural thickening and pericolonic infiltration along the mid sigmoid colon where an  extraluminal contained collection of stool demonstrating a relatively thick rind demonstrates communication between several loops of a sigmoid colon in keeping with a chronic colo-colonic fistula, likely the result of prior diverticulitis. An infiltrative malignancy is less likely, but not completely excluded. Correlation with colonoscopy may be helpful for further evaluation.     Assessment and Plan: # Sepsis streg agalactiae bacteremia due to osteomyelitis right second toe. Pt met criteria w/ leukocytosis, tachycardia, tachypnea, fever and bacteremia likely secondary to right 2nd toe osteomyelitis. S/p  IVF given. S/p IV zosyn. Prn pain meds.  On 11/7 started cefadroxil 500 mg p.o. twice daily. Repeat blood cxs NGTD. D/c on cefadroxil 500 mg p.o. twice daily for 7 days as per ID # Osteomyelitis of right 2nd toe: MRI positive for second toe osteomyelitis and multiple other findings. S/p IV zosyn. Podiatry consulted, s/p amputation of second toe done on 11/6, recommended no dressing change until follow-up in 1 week as an outpatient after discharge and continue doxycycline for 10 days.  Weightbearing as tolerated with surgical shoe. ID consulted, Rec to D/c on cefadroxil 500 mg p.o. twice daily for 7 days   # Fall at home, initial encounter: CT head negative.  No acute injury on imaging of x-ray of right knee and right hip/pelvis.  PT/OT consulted, home health was arranged by TOC. # DM2: HbA1c 8.3, poorly controlled. Continue on glargine, SSI w/ accuchecks  # Hypokalemia: potassium repleted. K 3.2 40 meq x 1 dose given before discharge. # HLD: continue on statin  # Chronic diastolic CHF: echo on 12/25/2021 showd EF 60-85% with grade I diastolic dysfunction. Trace leg edema, but no pulm edema chest x-ray, BNP normal 63.  CHF is compensated.Holding lasix. Monitor I/Os  # CKDIV: baseline Cr 1.35-1.6. Cr is 1.73 trending up slightly, patient was advised to continue oral hydration.  Follow with PCP to repeat  BMP in 1 week.  # IDA: continue on iron supplement  # Chronic bilateral low back pain with left-sided sciatica: Ultracet was not helping which was discontinued and prescribed Dilaudid 2 mg p.o. every 6 hourly as needed. # Thrombocytopenia: etiology unclear. Plts 133k Stable  # Diverticulosis: Severe sigmoid diverticulosis. 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 An infiltrative malignancy is less likely, but not completely excluded. Correlation with colonoscopy may be helpful for further evaluation. Colonoscopy can be done as an outpatient    # Chronic diarrhea, patient uses Imodium prn at home.  Follow with PCP for further management. # Obesity class II Body mass index is 37.85 kg/m.  Interventions: Calorie restricted diet and daily exercise advised to lose body weight.  Lifestyle modification discussed.      Pain control  - [employer] Controlled Substance Reporting System database could not be reviewed, because website was not working.. - 5 day supply was provided. - Patient was instructed, not  to drive, operate heavy machinery, perform activities at heights, swimming or participation in water activities or provide baby sitting services while on Pain, Sleep and Anxiety Medications; until her outpatient Physician has advised to do so again.  - Also recommended to not to take more than prescribed Pain, Sleep and Anxiety Medications.  Patient was seen by physical therapy, who recommended Home health, which was arranged. On the day of the discharge the patient's vitals were stable, and no other acute medical condition were reported by patient. the patient was felt safe to be discharge at Home with Home  health.  Consultants: Podiatry and ID Procedures:  s/p amputation of second toe done on 11/6,   Discharge Exam: General: Appear in no distress, no Rash; Oral Mucosa Clear, moist. Cardiovascular: S1 and S2 Present, no Murmur, Respiratory: normal respiratory effort, Bilateral Air entry present and no Crackles, no wheezes Abdomen: Bowel Sound present, Soft and no tenderness, no hernia Extremities: no Pedal edema, no calf tenderness, s/p right second toe amputation, dressing CDI. Neurology: alert and oriented to time, place, and person affect appropriate.  Filed Weights   02/25/23 0250 02/27/23 0500 02/28/23 0500  Weight: 84.4 kg 87.9 kg 87.9 kg   Vitals:   02/28/23 0809 02/28/23 0809  BP: (!) 141/68 (!) 141/68  Pulse: 86 86  Resp: 17 17  Temp: 97.9 F (36.6 C) 97.9 F (36.6 C)  SpO2: 97% 98%    DISCHARGE MEDICATION: Allergies as of 02/28/2023       Reactions   Amoxicillin Nausea And Vomiting        Medication List     STOP taking these medications    docusate sodium 100 MG capsule Commonly known as: COLACE   doxycycline 100 MG capsule Commonly known as: MONODOX   methocarbamol 750 MG tablet Commonly known as: ROBAXIN   senna 8.6 MG Tabs tablet Commonly known as: SENOKOT   traMADol-acetaminophen 37.5-325 MG tablet Commonly known as: ULTRACET       TAKE these medications    acetaminophen 325 MG tablet Commonly known as: TYLENOL Take 2 tablets (650 mg total) by mouth 3 (three) times daily.   CALCIUM 1200+D3 PO Take 1 tablet by mouth daily.   cefadroxil 500 MG capsule Commonly known as: DURICEF Take 1 capsule (500 mg total) by mouth 2 (two) times daily for 7 days.   Eylea 2 MG/0.05ML Soln Generic drug: Aflibercept   Ferrous Gluconate 324 (37.5 Fe) MG Tabs Take 324 mg by mouth daily.   furosemide 20 MG tablet Commonly known as: LASIX Take 2 tablets by mouth in the AM and take 1 tablet by mouth in the PM. May add 1 tablet to afternoon dose if  swelling not improved.   gentamicin cream 0.1 % Commonly known as: GARAMYCIN Apply to ulcers once daily.   glipiZIDE 2.5 MG 24 hr tablet Commonly known as: GLUCOTROL XL Take 1 tablet (2.5 mg total) by mouth daily with breakfast.   hydrocortisone 2.5 % cream APPLY TO AFFECTED AREAS BUTTOCKS ONCE TO TWICE DAILY UNTIL IMPROVED.   HYDROmorphone 2 MG tablet Commonly known as: DILAUDID Take 1 tablet (2 mg total) by mouth every 6 (six) hours as needed for up to 5 days for severe pain (pain score 7-10) or moderate pain (pain score 4-6).   ketoconazole 2 % cream Commonly known as: NIZORAL Apply to affected area buttocks twice daily until improved.   Krill Oil 500 MG Caps Take 1 capsule by mouth daily.   Levemir FlexPen 100  UNIT/ML FlexPen Generic drug: insulin detemir INJECT UP TO 75 UNITS UNDER THE SKIN DAILY, TITRATE AS NEEDED. What changed:  how much to take when to take this   Move Free Ultra Joint Health 40-5-3.3 MG Tabs Generic drug: Collagen-Boron-Hyaluronic Acid Take 1 tablet by mouth daily.   multivitamin tablet Take 1 tablet by mouth daily at 12 noon.   NovoLOG FlexPen 100 UNIT/ML FlexPen Generic drug: insulin aspart INJECT UNDER THE SKIN TWICE DAILY AS DIRECTED PER SLIDING SCALE. MAX DAILY DOSE IS 30 UNITS.   NovoLOG FlexPen 100 UNIT/ML FlexPen Generic drug: insulin aspart INJECT Gibson 3 times daily with meals AS DIRECTED PER SLIDING SCALE. MAX DAILY DOSE IS 30 UNITS. DX E11.65.   pantoprazole 40 MG tablet Commonly known as: PROTONIX TAKE 1 TABLET BY MOUTH EVERY DAY   pregabalin 100 MG capsule Commonly known as: LYRICA Take 1 capsule (100 mg total) by mouth 2 (two) times daily.   Probiotic Acidophilus Caps Take 1 capsule by mouth daily.   rosuvastatin 20 MG tablet Commonly known as: CRESTOR TAKE 1 TABLET BY MOUTH EVERYDAY AT BEDTIME   tamoxifen 20 MG tablet Commonly known as: NOLVADEX Take 1 tablet (20 mg total) by mouth daily.   triamcinolone 0.025 %  cream Commonly known as: KENALOG Apply 1 Application topically 2 (two) times daily as needed (itching). To groin area   Trulicity 1.5 MG/0.5ML Soaj Generic drug: Dulaglutide Inject 1.5 mg into the skin once a week. What changed: additional instructions       Allergies  Allergen Reactions   Amoxicillin Nausea And Vomiting   Discharge Instructions     Call MD for:  difficulty breathing, headache or visual disturbances   Complete by: As directed    Call MD for:  extreme fatigue   Complete by: As directed    Call MD for:  persistant dizziness or light-headedness   Complete by: As directed    Call MD for:  severe uncontrolled pain   Complete by: As directed    Call MD for:  temperature >100.4   Complete by: As directed    Diet - low sodium heart healthy   Complete by: As directed    Diet Carb Modified   Complete by: As directed    Discharge instructions   Complete by: As directed    Follow with PCP in 1 week.  Repeat CBC and BMP after 1 week to check electrolytes and renal functions. Follow-up with podiatry in 1 week   Increase activity slowly   Complete by: As directed        The results of significant diagnostics from this hospitalization (including imaging, microbiology, ancillary and laboratory) are listed below for reference.    Significant Diagnostic Studies: DG Foot Complete Left  Result Date: 02/25/2023 CLINICAL DATA:  Recent fall. Right fourth toe fracture. Visible bruising on fourth and fifth digit of left foot. EXAM: LEFT FOOT - COMPLETE 3+ VIEW COMPARISON:  Left foot radiographs 11/21/2021 FINDINGS: There appears to be mild "shouldering" that could represent minimal cortical step-off at the distal metaphysis of the proximal phalanges of the third through fifth toes, appearing slightly changed from 11/21/2021. However, no acute fracture is seen and this may represent the patient's normal anatomy and degenerative change. There is subtle transverse linear lucency  within the proximal shaft of the proximal phalanx of the fifth toe that is not as low-density as would be expected for an acute fracture and may represent normal trabecular markings. Mild great toe metatarsophalangeal joint  space narrowing and peripheral osteophytosis. Mild hallux valgus. Moderate plantar calcaneal heel spur. IMPRESSION: 1. There appears to be mild shouldering that could represent minimal cortical step-off at the distal metaphysis of the proximal phalanges of the third through fifth toes, appearing slightly changed from 11/21/2021. However, no acute fracture is seen and this may represent the patient's normal anatomy and degenerative change. Recommend clinical correlation for point tenderness. 2. Subtle transverse linear lucency within the proximal shaft of the proximal phalanx of the fifth toe that is not as low-density as would be expected for an acute fracture and may represent normal trabecular markings. Also recommend clinical correlation for point tenderness. 3. Mild great toe metatarsophalangeal joint osteoarthritis. Mild hallux valgus. 4. Moderate plantar calcaneal heel spur. Electronically Signed   By: Neita Garnet M.D.   On: 02/25/2023 13:52   MR FOOT RIGHT WO CONTRAST  Result Date: 02/25/2023 EXAM: MRI OF THE RIGHT FOREFOOT WITHOUT CONTRAST TECHNIQUE: Multiplanar, multisequence MR imaging of the right forefoot was performed. No intravenous contrast was administered. COMPARISON:  Right foot radiographs 02/24/2023, 02/11/2023, and 11/21/2021 FINDINGS: Despite efforts by the technologist and patient, mild motion artifact is present on today's exam and could not be eliminated. This reduces exam sensitivity and specificity. Bones/Joint/Cartilage There is diffuse high-grade marrow edema throughout the distal phalanx of the second toe suspicious for acute osteomyelitis. Probable mild cortical of the distal aspect of the distal phalanx. More mild-to-moderate edema within the second toe  middle phalanx. Mild hallux valgus. Moderate great toe metatarsophalangeal cartilage thinning and peripheral osteophytosis. Moderate cartilage thinning of the third tarsometatarsal joint with mild subchondral marrow edema/cystic change (coronal series 7, image 7). Moderate fourth tarsometatarsal cartilage thinning and subchondral marrow edema greatest within the fourth metatarsal. Moderate talonavicular navicular-cuneiform cartilage thinning. Mild navicular degenerative subchondral marrow edema. Ligaments The Lisfranc ligament complex is intact. The metatarsophalangeal and interphalangeal collateral ligaments appear intact. Muscles and Tendons Minimal proximal flexor hallucis longus tenosynovitis and minimal proximal flexor digitorum longus tenosynovitis within the midfoot to hindfoot. No flexor or extensor tendon tear is seen. There is complete fatty infiltration of the abductor digiti minimi muscle. Mild-to-moderate atrophy of the extensor digitorum brevis muscle. Mild diffuse edema within the intrinsic interosseous midfoot to forefoot musculature. Soft tissues Reportedly the patient has an ulcer at the distal aspect of the second toe. There is thinning and irregularity of the distal second toe soft tissues consistent with this ulcer (coronal series 7, image 16). There is moderate third toe subcutaneous fat edema and swelling. There is mild-to-moderate diffuse midfoot to forefoot subcutaneous fat edema and swelling. IMPRESSION: 1. Moderate third toe soft tissue swelling. Reportedly the patient has an ulcer at the distal aspect of the second toe. There is thinning and irregularity of the distal second toe soft tissues consistent with this ulcer. 2. Diffuse high-grade marrow edema throughout the distal phalanx of the second toe and probable mild cortical erosion of the distal aspect of the distal phalanx. More mild-to-moderate edema within the second toe middle phalanx. Findings are suspicious for acute  osteomyelitis of the distal phalanx with possible osteomyelitis of the middle phalanx. 3. Moderate great toe metatarsophalangeal osteoarthritis. Moderate third and fourth tarsometatarsal osteoarthritis. Electronically Signed   By: Neita Garnet M.D.   On: 02/25/2023 13:47   DG Foot Complete Right  Result Date: 02/24/2023 CLINICAL DATA:  Right second toe ulcer.  Fall. EXAM: RIGHT FOOT COMPLETE - 3+ VIEW COMPARISON:  Right foot radiographs 02/11/2023 and 11/21/2021 FINDINGS: There is diffuse decreased bone mineralization.  There is again mild-to-moderate distal second toe soft tissue swelling. There is soft tissue irregularity indicating an ulcer at the distal second toe. There is mild lucency at the distal dorsal aspect of the distal phalanx of the second toe, suspicious for acute osteomyelitis. Moderate to large plantar calcaneal heel spur. Mild-to-moderate dorsal navicular-cuneiform and second tarsometatarsal degenerative osteophytes on lateral view. Mild hallux valgus. Mild great toe metatarsophalangeal joint space narrowing and peripheral osteophytosis. No acute fracture or dislocation. IMPRESSION: 1. Second toe soft tissue swelling and distal ulcer. Mild lucency at the distal dorsal aspect of the distal phalanx of the second toe, suspicious for acute osteomyelitis. 2. Moderate to large plantar calcaneal heel spur. Electronically Signed   By: Neita Garnet M.D.   On: 02/24/2023 14:58   CT CHEST ABDOMEN PELVIS WO CONTRAST  Result Date: 02/23/2023 CLINICAL DATA:  Sepsis, fall, back pain EXAM: CT CHEST, ABDOMEN AND PELVIS WITHOUT CONTRAST TECHNIQUE: Multidetector CT imaging of the chest, abdomen and pelvis was performed following the standard protocol without IV contrast. RADIATION DOSE REDUCTION: This exam was performed according to the departmental dose-optimization program which includes automated exposure control, adjustment of the mA and/or kV according to patient size and/or use of iterative  reconstruction technique. COMPARISON:  None Available. FINDINGS: CT CHEST FINDINGS Cardiovascular: Mild coronary artery calcification. Global cardiac size within normal limits. No pericardial effusion. Central pulmonary arteries are enlarged in keeping with changes of pulmonary arterial hypertension. Moderate atherosclerotic calcification within the thoracic aorta. No aortic aneurysm. Mediastinum/Nodes: No enlarged mediastinal, hilar, or axillary lymph nodes. Thyroid gland, trachea, and esophagus demonstrate no significant findings. Lungs/Pleura: Clustered nodules within the a right lower lobe at axial image # 95/4 measuring less than 4 mm in diameter are nonspecific, but likely post infectious or post inflammatory in nature. Lungs are otherwise clear. No pneumothorax or pleural effusion. Musculoskeletal: Osseous structures are age-appropriate. No acute bone abnormality. No lytic or blastic bone lesion. CT ABDOMEN PELVIS FINDINGS Hepatobiliary: Mild hepatomegaly and hepatic steatosis. No definite intrahepatic mass. No intra or extrahepatic biliary ductal dilation. Status post cholecystectomy. Pancreas: Unremarkable Spleen: Unremarkable Adrenals/Urinary Tract: Adrenal glands are unremarkable. Kidneys are normal, without renal calculi, focal lesion, or hydronephrosis. Bladder is unremarkable. Stomach/Bowel: There is severe diverticulosis involving a redundant sigmoid colon. There is focal mural thickening and pericolonic infiltration along the mid sigmoid colon where an extraluminal contained collection of stool demonstrating a relatively thick rind demonstrates communication between several loops of a sigmoid colon in keeping with a chronic colo-colonic fistula, likely the result of prior diverticulitis. This is best seen on coronal image # 49-63 and axial image # 93/2. An infiltrative malignancy is less likely, but not completely excluded. The stomach, small bowel, and large bowel are otherwise unremarkable. No  evidence of obstruction. Appendix normal. No free intraperitoneal gas or fluid. Vascular/Lymphatic: Aortic atherosclerosis. No enlarged abdominal or pelvic lymph nodes. Reproductive: Uterus and bilateral adnexa are unremarkable. Other: Small multilobulated fat containing umbilical hernia. Musculoskeletal: Postsurgical changes of L3-L5 lumbar fusion with instrumentation are seen within the lumbar spine. No acute bone abnormality. No lytic or blastic bone lesion. IMPRESSION: 1. No acute intrathoracic or intra-abdominal pathology identified. 2. Mild coronary artery calcification. 3. Morphologic changes in keeping with pulmonary arterial hypertension. 4. Mild hepatomegaly and hepatic steatosis. 5. Severe sigmoid diverticulosis. Focal mural thickening and pericolonic infiltration along the mid sigmoid colon where an extraluminal contained collection of stool demonstrating a relatively thick rind demonstrates communication between several loops of a sigmoid colon in keeping with a  chronic colo-colonic fistula, likely the result of prior diverticulitis. An infiltrative malignancy is less likely, but not completely excluded. Correlation with colonoscopy may be helpful for further evaluation. Aortic Atherosclerosis (ICD10-I70.0). Electronically Signed   By: Helyn Numbers M.D.   On: 02/23/2023 19:07   DG Knee Complete 4 Views Right  Result Date: 02/23/2023 CLINICAL DATA:  Pain after fall EXAM: RIGHT KNEE - COMPLETE 4 VIEW COMPARISON:  None Available. FINDINGS: Osteopenia. Osteophyte formation seen along the medial compartment and patellofemoral joint. Grossly preserved joint spaces. No joint effusion on lateral view. IMPRESSION: Osteopenia.  Degenerative changes Electronically Signed   By: Karen Kays M.D.   On: 02/23/2023 17:23   DG Chest 1 View  Result Date: 02/23/2023 CLINICAL DATA:  Pain after fall.  Sepsis EXAM: CHEST  1 VIEW COMPARISON:  12/21/2021 and older. FINDINGS: The heart size and mediastinal contours  are within normal limits. No consolidation, pneumothorax or effusion. No edema. Stable enlargement of the pulmonary artery centrally. The visualized skeletal structures are unremarkable. Overlapping cardiac leads. IMPRESSION: No acute cardiopulmonary disease. Electronically Signed   By: Karen Kays M.D.   On: 02/23/2023 17:22   DG Hip Unilat W or Wo Pelvis 2-3 Views Right  Result Date: 02/23/2023 CLINICAL DATA:  Pain.  Fall EXAM: DG HIP (WITH OR WITHOUT PELVIS) 3V RIGHT COMPARISON:  None Available. FINDINGS: Osteopenia. No fracture or dislocation. Preserved joint spaces of the hips. There is slight sclerosis along the sacroiliac joints. Fixation hardware seen along the lower lumbar spine at the edge of the imaging field. With this level of osteopenia subtle nondisplaced injury is difficult to completely exclude and if there is further concern additional workup with CT or MRI as clinically appropriate. IMPRESSION: Osteopenia.  No acute osseous abnormality. Electronically Signed   By: Karen Kays M.D.   On: 02/23/2023 17:21   CT Head Wo Contrast  Result Date: 02/23/2023 CLINICAL DATA:  Fall EXAM: CT HEAD WITHOUT CONTRAST TECHNIQUE: Contiguous axial images were obtained from the base of the skull through the vertex without intravenous contrast. RADIATION DOSE REDUCTION: This exam was performed according to the departmental dose-optimization program which includes automated exposure control, adjustment of the mA and/or kV according to patient size and/or use of iterative reconstruction technique. COMPARISON:  None Available. FINDINGS: Brain: There is no acute intracranial hemorrhage, extra-axial fluid collection, or acute infarct. Parenchymal volume is within expected limits for age. The ventricles are normal in size. Gray-white differentiation is preserved. The pituitary and suprasellar region are normal. There is no mass lesion. There is no mass effect or midline shift. Vascular: No hyperdense vessel or  unexpected calcification. Skull: Normal. Negative for fracture or focal lesion. Sinuses/Orbits: The imaged paranasal sinuses are clear. Bilateral lens implants are in place. The globes and orbits are otherwise unremarkable. Other: The mastoid air cells and middle ear cavities are clear. IMPRESSION: No acute intracranial pathology. Electronically Signed   By: Lesia Hausen M.D.   On: 02/23/2023 17:11   DG Foot Complete Right  Result Date: 02/11/2023 Please see detailed radiograph report in office note.  MM 3D SCREENING MAMMOGRAM BILATERAL BREAST  Result Date: 02/05/2023 CLINICAL DATA:  Screening. EXAM: DIGITAL SCREENING BILATERAL MAMMOGRAM WITH TOMOSYNTHESIS AND CAD TECHNIQUE: Bilateral screening digital craniocaudal and mediolateral oblique mammograms were obtained. Bilateral screening digital breast tomosynthesis was performed. The images were evaluated with computer-aided detection. COMPARISON:  Previous exam(s). ACR Breast Density Category b: There are scattered areas of fibroglandular density. FINDINGS: There are no findings suspicious for malignancy.  IMPRESSION: No mammographic evidence of malignancy. A result letter of this screening mammogram will be mailed directly to the patient. RECOMMENDATION: Screening mammogram in one year. (Code:[id number]) BI-RADS CATEGORY  1: Negative. Electronically Signed   By: Frederico Hamman M.D.   On: 02/05/2023 14:24    Microbiology: Recent Results (from the past 240 hour(s))  Culture, blood (routine x 2)     Status: Abnormal   Collection Time: 02/23/23  4:00 PM   Specimen: BLOOD  Result Value Ref Range Status   Specimen Description   Final    BLOOD LEFT ANTECUBITAL Performed at [facility], [address], [place], [place] [address]    Special Requests   Final    BOTTLES DRAWN AEROBIC AND ANAEROBIC Blood Culture adequate volume Performed at [facility], [address] [place], [place] [address]    Culture  Setup Time   Final     GRAM POSITIVE COCCI IN BOTH AEROBIC AND ANAEROBIC BOTTLES CRITICAL VALUE NOTED.  VALUE IS CONSISTENT WITH PREVIOUSLY REPORTED AND CALLED VALUE. Performed at [facility], [address] [place], [place] [address]    Culture (A)  Final    GROUP B STREP(S.AGALACTIAE)ISOLATED SUSCEPTIBILITIES PERFORMED ON PREVIOUS CULTURE WITHIN THE LAST 5 DAYS. Performed at [facility], [address] [place], [place] [address]    Report Status 02/26/2023 FINAL  Final  Culture, blood (routine x 2)     Status: Abnormal   Collection Time: 02/23/23  4:00 PM   Specimen: BLOOD  Result Value Ref Range Status   Specimen Description   Final    BLOOD BLOOD LEFT FOREARM Performed at [facility], [address], [place], [place] [address]    Special Requests   Final    BOTTLES DRAWN AEROBIC AND ANAEROBIC Blood Culture adequate volume Performed at [facility], [address] [place], [place] [address]    Culture  Setup Time   Final    GRAM POSITIVE COCCI IN BOTH AEROBIC AND ANAEROBIC BOTTLES CRITICAL RESULT CALLED TO, READ BACK BY AND VERIFIED WITHDomingo Pulse [facility] 5784 02/24/23 HNM Performed at [facility], [address] [place], [place] [address]    Culture GROUP B STREP(S.AGALACTIAE)ISOLATED (A)  Final   Report Status 02/26/2023 FINAL  Final   Organism ID, Bacteria GROUP B STREP(S.AGALACTIAE)ISOLATED  Final      Susceptibility   Group b strep(s.agalactiae)isolated - MIC*    CLINDAMYCIN <=0.25 SENSITIVE Sensitive     AMPICILLIN <=0.25 SENSITIVE Sensitive     VANCOMYCIN 0.5 SENSITIVE Sensitive     CEFTRIAXONE <=0.12 SENSITIVE Sensitive     PENICILLIN <=0.06 SENSITIVE Sensitive     * GROUP B STREP(S.AGALACTIAE)ISOLATED  Blood Culture ID Panel (Reflexed)     Status: Abnormal   Collection Time: 02/23/23  4:00 PM  Result Value Ref Range Status   Enterococcus faecalis NOT DETECTED NOT DETECTED Final   Enterococcus Faecium NOT DETECTED NOT  DETECTED Final   Listeria monocytogenes NOT DETECTED NOT DETECTED Final   Staphylococcus species NOT DETECTED NOT DETECTED Final   Staphylococcus aureus (BCID) NOT DETECTED NOT DETECTED Final   Staphylococcus epidermidis NOT DETECTED NOT DETECTED Final   Staphylococcus lugdunensis NOT DETECTED NOT DETECTED Final   Streptococcus species DETECTED (A) NOT DETECTED Final    Comment: CRITICAL RESULT CALLED TO, READ BACK BY AND VERIFIED WITH: CAROLINE CHILDS PHARMD 0732 02/24/23 HNM    Streptococcus agalactiae DETECTED (A) NOT DETECTED Final    Comment: CRITICAL RESULT CALLED  TO, READ BACK BY AND VERIFIED WITH: CAROLINE CHILDS PHARMD 0732 02/24/23 HNM    Streptococcus pneumoniae NOT DETECTED NOT DETECTED Final   Streptococcus pyogenes NOT DETECTED NOT DETECTED Final   A.calcoaceticus-baumannii NOT DETECTED NOT DETECTED Final   Bacteroides fragilis NOT DETECTED NOT DETECTED Final   Enterobacterales NOT DETECTED NOT DETECTED Final   Enterobacter cloacae complex NOT DETECTED NOT DETECTED Final   Escherichia coli NOT DETECTED NOT DETECTED Final   Klebsiella aerogenes NOT DETECTED NOT DETECTED Final   Klebsiella oxytoca NOT DETECTED NOT DETECTED Final   Klebsiella pneumoniae NOT DETECTED NOT DETECTED Final   Proteus species NOT DETECTED NOT DETECTED Final   Salmonella species NOT DETECTED NOT DETECTED Final   Serratia marcescens NOT DETECTED NOT DETECTED Final   Haemophilus influenzae NOT DETECTED NOT DETECTED Final   Neisseria meningitidis NOT DETECTED NOT DETECTED Final   Pseudomonas aeruginosa NOT DETECTED NOT DETECTED Final   Stenotrophomonas maltophilia NOT DETECTED NOT DETECTED Final   Candida albicans NOT DETECTED NOT DETECTED Final   Candida auris NOT DETECTED NOT DETECTED Final   Candida glabrata NOT DETECTED NOT DETECTED Final   Candida krusei NOT DETECTED NOT DETECTED Final   Candida parapsilosis NOT DETECTED NOT DETECTED Final   Candida tropicalis NOT DETECTED NOT DETECTED Final    Cryptococcus neoformans/gattii NOT DETECTED NOT DETECTED Final    Comment: Performed at [facility], [address], [place], [place] [address]  Urine Culture     Status: None   Collection Time: 02/23/23  5:37 PM   Specimen: Urine, Clean Catch  Result Value Ref Range Status   Specimen Description   Final    URINE, CLEAN CATCH Performed at [facility], [address], [place], [place] [address]    Special Requests   Final    Normal Performed at [facility], [address] [place], [place] [address]    Culture   Final    NO GROWTH Performed at [facility], [address] [place], [place] [address]    Report Status 02/25/2023 FINAL  Final  SARS Coronavirus 2 by RT PCR (hospital order, performed in [facility] hospital lab) *cepheid single result test* Anterior Nasal Swab     Status: None   Collection Time: 02/23/23  5:56 PM   Specimen: Anterior Nasal Swab  Result Value Ref Range Status   SARS Coronavirus 2 by RT PCR NEGATIVE NEGATIVE Final    Comment: (NOTE) SARS-CoV-2 target nucleic acids are NOT DETECTED.  The SARS-CoV-2 RNA is generally detectable in upper and lower respiratory specimens during the acute phase of infection. The lowest concentration of SARS-CoV-2 viral copies this assay can detect is 250 copies / mL. A negative result does not preclude SARS-CoV-2 infection and should not be used as the sole basis for treatment or other patient management decisions.  A negative result may occur with improper specimen collection / handling, submission of specimen other than nasopharyngeal swab, presence of viral mutation(s) within the areas targeted by this assay, and inadequate number of viral copies (<250 copies / mL). A negative result must be combined with clinical observations, patient history, and epidemiological information.  Fact Sheet for Patients:   RoadLapTop.co.za  Fact Sheet for Healthcare  Providers: http://kim-miller.com/  This test is not yet approved or  cleared by the [place] FDA and has been authorized for detection and/or diagnosis of SARS-CoV-2 by FDA under an Emergency Use Authorization (EUA).  This EUA will remain in effect (meaning this  test can be used) for the duration of the COVID-19 declaration under Section 564(b)(1) of the Act, 21 U.S.C. section 360bbb-3(b)(1), unless the authorization is terminated or revoked sooner.  Performed at [facility], [address] [place], [place] [address]   Respiratory (~20 pathogens) panel by PCR     Status: None   Collection Time: 02/23/23  9:42 PM   Specimen: Nasopharyngeal Swab; Respiratory  Result Value Ref Range Status   Adenovirus NOT DETECTED NOT DETECTED Final   Coronavirus 229E NOT DETECTED NOT DETECTED Final    Comment: (NOTE) The Coronavirus on the Respiratory Panel, DOES NOT test for the novel  Coronavirus (2019 nCoV)    Coronavirus HKU1 NOT DETECTED NOT DETECTED Final   Coronavirus NL63 NOT DETECTED NOT DETECTED Final   Coronavirus OC43 NOT DETECTED NOT DETECTED Final   Metapneumovirus NOT DETECTED NOT DETECTED Final   Rhinovirus / Enterovirus NOT DETECTED NOT DETECTED Final   Influenza A NOT DETECTED NOT DETECTED Final   Influenza B NOT DETECTED NOT DETECTED Final   Parainfluenza Virus 1 NOT DETECTED NOT DETECTED Final   Parainfluenza Virus 2 NOT DETECTED NOT DETECTED Final   Parainfluenza Virus 3 NOT DETECTED NOT DETECTED Final   Parainfluenza Virus 4 NOT DETECTED NOT DETECTED Final   Respiratory Syncytial Virus NOT DETECTED NOT DETECTED Final   Bordetella pertussis NOT DETECTED NOT DETECTED Final   Bordetella Parapertussis NOT DETECTED NOT DETECTED Final   Chlamydophila pneumoniae NOT DETECTED NOT DETECTED Final   Mycoplasma pneumoniae NOT DETECTED NOT DETECTED Final    Comment: Performed at [facility], [address], [place], [place] [address]   Aerobic/Anaerobic Culture w Gram Stain (surgical/deep wound)     Status: None (Preliminary result)   Collection Time: 02/26/23  5:31 PM   Specimen: PATH Bone resection; Tissue  Result Value Ref Range Status   Specimen Description TISSUE  Final   Special Requests RIGHT SECOND TOE BONE CULT SPEC A  Final   Gram Stain NO WBC SEEN NO ORGANISMS SEEN   Final   Culture   Final    CULTURE REINCUBATED FOR BETTER GROWTH Performed at [facility], [address], [place], [place] [address]    Report Status PENDING  Incomplete  Aerobic/Anaerobic Culture w Gram Stain (surgical/deep wound)     Status: None (Preliminary result)   Collection Time: 02/26/23  5:32 PM   Specimen: Path Tissue  Result Value Ref Range Status   Specimen Description WOUND  Final   Special Requests RIGHT SECOND TOE WD  Final   Gram Stain NO WBC SEEN NO ORGANISMS SEEN   Final   Culture   Final    NO GROWTH 1 DAY Performed at [facility], [address] [place], [place] [address]    Report Status PENDING  Incomplete  Culture, blood (Routine X 2) w Reflex to ID Panel     Status: None (Preliminary result)   Collection Time: 02/27/23  5:32 AM   Specimen: BLOOD LEFT HAND  Result Value Ref Range Status   Specimen Description BLOOD LEFT HAND  Final   Special Requests   Final    BOTTLES DRAWN AEROBIC AND ANAEROBIC Blood Culture adequate volume   Culture   Final    NO GROWTH < 24 HOURS Performed at [facility], [address] [place], [place] [address]    Report Status PENDING  Incomplete  Culture, blood (Routine X 2) w Reflex to ID Panel  Status: None (Preliminary result)   Collection Time: 02/27/23  5:43 AM   Specimen: BLOOD LEFT ARM  Result Value Ref Range Status   Specimen Description BLOOD LEFT ARM  Final   Special Requests   Final    BOTTLES DRAWN AEROBIC AND ANAEROBIC Blood Culture adequate volume   Culture   Final    NO GROWTH < 24 HOURS Performed at [facility], [address] [place], [place] [address]    Report Status PENDING  Incomplete     Labs: CBC: Recent Labs  Lab 02/23/23 1602 02/24/23 0541 02/25/23 0458 02/26/23 0429 02/27/23 0532 02/28/23 0246  WBC 10.6* 11.5* 10.0 8.8 6.0 5.4  NEUTROABS 9.8*  --   --   --   --   --   HGB 10.5* 8.6* 9.0* 8.8* 8.3* 8.0*  HCT 33.3* 27.6* 27.9* 28.2* 26.4* 26.0*  MCV 86.9 89.0 85.6 86.5 86.6 86.4  PLT 134* 107* 106* 122* 128* 133*   Basic Metabolic Panel: Recent Labs  Lab 02/24/23 0541 02/25/23 0458 02/26/23 0429 02/27/23 0532 02/28/23 0246  NA 138 139 145 141 138  K 3.3* 3.2* 3.4* 3.4* 3.2*  CL 103 107 113* 110 108  CO2 25 22 24 25 24   GLUCOSE 218* 141* 79 135* 140*  BUN 31* 33* 29* 23 23  CREATININE 1.84* 1.87* 1.74* 1.67* 1.73*  CALCIUM 7.9* 7.8* 8.3* 8.1* 8.1*  MG  --   --  2.1 2.0 2.0  PHOS  --   --  2.4* 3.3 3.8   Liver Function Tests: Recent Labs  Lab 02/23/23 1602  AST 26  ALT 19  ALKPHOS 81  BILITOT 1.3*  PROT 7.8  ALBUMIN 3.2*   No results for input(s): "LIPASE", "AMYLASE" in the last 168 hours. No results for input(s): "AMMONIA" in the last 168 hours. Cardiac Enzymes: No results for input(s): "CKTOTAL", "CKMB", "CKMBINDEX", "TROPONINI" in the last 168 hours. BNP (last 3 results) Recent Labs    02/23/23 1600  BNP 63.0   CBG: Recent Labs  Lab 02/27/23 1201 02/27/23 1330 02/27/23 1648 02/27/23 2148 02/28/23 0807  GLUCAP 175* 161* 113* 152* 117*    Time spent: 35 minutes  Signed:  Gillis Santa  [facility] 02/28/2023 11:40 AM

## 2023-02-28 NOTE — Consult Note (Signed)
[facility] Liaison Note  02/28/2023  Kara [name] [birth year] [id number]  Screened chart due to [facility].   Location: [facility] Liaison screened the patient remotely at [facility].  Insurance: [employer] HMO   Kara [name] is a 77 y.o. female who is a Primary Care Patient of Abernathy, Arlyss Repress, NP ([facility]). The patient was screened for  readmission hospitalization with noted medium risk score for unplanned readmission risk with 1 IP in 6 months.  The patient was assessed for potential Care Management service needs for post hospital transition for care coordination. Review of patient's electronic medical record reveals patient was admitted for Toe Osteomyelitis (Sepsis).  Pt will discharge home with [facility]). No anticipated needs for care management services.    [facility] Care Management/Population Health does not replace or interfere with any arrangements made by the Inpatient Transition of Care team.   For questions contact:   Elliot Cousin, RN, [facility] Liaison [place]   [facility], Population Health Office Hours MTWF  8:00 am-6:00 pm Direct Dial: [phone] mobile [phone] [Office toll free line] Office Hours are M-F 8:30 - 5 pm [name]

## 2023-02-28 NOTE — TOC Progression Note (Signed)
Transition of Care [facility]) - Progression Note    Patient Details  Name: Kara [name] MRN: [id number] Date of Birth: 12-18-1945  Transition of Care [facility]) CM/SW Contact  Marlowe Sax, RN Phone Number: 02/28/2023, 9:07 AM  Clinical Narrative:    Met with the patient and her family in the room, she does not want to go to rehab and plans to go home with [facility] PT, [facility] accepted the patient,, she has used them in the past and wants to again   Expected Discharge Plan: Home w Home Health Services Barriers to Discharge: No Barriers Identified  Expected Discharge Plan and Services   Discharge Planning Services: CM Consult   Living arrangements for the past 2 months: Single Family Home                 DME Arranged: N/A DME Agency: NA       HH Arranged: PT, OT HH Agency: [facility] Date [facility] Agency Contacted: 02/28/23 Time HH Agency Contacted: [phone] Representative spoke with at [facility] Agency: Kandee Keen   Social Determinants of Health (SDOH) Interventions SDOH Screenings   Food Insecurity: No Food Insecurity (02/24/2023)  Housing: Low Risk  (02/24/2023)  Transportation Needs: No Transportation Needs (02/24/2023)  Utilities: Not At Risk (02/24/2023)  Alcohol Screen: Low Risk  (01/31/2022)  Depression (PHQ2-9): Low Risk  (10/17/2022)  Financial Resource Strain: Low Risk  (10/03/2020)  Tobacco Use: Medium Risk (02/23/2023)    Readmission Risk Interventions     No data to display

## 2023-02-28 NOTE — Care Management Important Message (Signed)
Important Message  Patient Details  Name: Kara [name] MRN: [id number] Date of Birth: 12/24/1945   Important Message Given:  Yes - Medicare IM     Olegario Messier A [name] 02/28/2023, 11:50 AM

## 2023-03-02 DIAGNOSIS — Z89421 Acquired absence of other right toe(s): Secondary | ICD-10-CM | POA: Diagnosis not present

## 2023-03-02 DIAGNOSIS — M86171 Other acute osteomyelitis, right ankle and foot: Secondary | ICD-10-CM | POA: Diagnosis not present

## 2023-03-02 DIAGNOSIS — I5032 Chronic diastolic (congestive) heart failure: Secondary | ICD-10-CM | POA: Diagnosis not present

## 2023-03-02 DIAGNOSIS — Z4781 Encounter for orthopedic aftercare following surgical amputation: Secondary | ICD-10-CM | POA: Diagnosis not present

## 2023-03-02 DIAGNOSIS — I110 Hypertensive heart disease with heart failure: Secondary | ICD-10-CM | POA: Diagnosis not present

## 2023-03-02 DIAGNOSIS — A419 Sepsis, unspecified organism: Secondary | ICD-10-CM | POA: Diagnosis not present

## 2023-03-02 DIAGNOSIS — E1169 Type 2 diabetes mellitus with other specified complication: Secondary | ICD-10-CM | POA: Diagnosis not present

## 2023-03-02 DIAGNOSIS — K632 Fistula of intestine: Secondary | ICD-10-CM | POA: Diagnosis not present

## 2023-03-02 DIAGNOSIS — E1122 Type 2 diabetes mellitus with diabetic chronic kidney disease: Secondary | ICD-10-CM | POA: Diagnosis not present

## 2023-03-03 ENCOUNTER — Telehealth: Payer: Self-pay

## 2023-03-03 DIAGNOSIS — I110 Hypertensive heart disease with heart failure: Secondary | ICD-10-CM | POA: Diagnosis not present

## 2023-03-03 DIAGNOSIS — A419 Sepsis, unspecified organism: Secondary | ICD-10-CM | POA: Diagnosis not present

## 2023-03-03 DIAGNOSIS — K632 Fistula of intestine: Secondary | ICD-10-CM | POA: Diagnosis not present

## 2023-03-03 DIAGNOSIS — Z89421 Acquired absence of other right toe(s): Secondary | ICD-10-CM | POA: Diagnosis not present

## 2023-03-03 DIAGNOSIS — E1122 Type 2 diabetes mellitus with diabetic chronic kidney disease: Secondary | ICD-10-CM | POA: Diagnosis not present

## 2023-03-03 DIAGNOSIS — E1169 Type 2 diabetes mellitus with other specified complication: Secondary | ICD-10-CM | POA: Diagnosis not present

## 2023-03-03 DIAGNOSIS — M86171 Other acute osteomyelitis, right ankle and foot: Secondary | ICD-10-CM | POA: Diagnosis not present

## 2023-03-03 DIAGNOSIS — I5032 Chronic diastolic (congestive) heart failure: Secondary | ICD-10-CM | POA: Diagnosis not present

## 2023-03-03 DIAGNOSIS — Z4781 Encounter for orthopedic aftercare following surgical amputation: Secondary | ICD-10-CM | POA: Diagnosis not present

## 2023-03-03 NOTE — Telephone Encounter (Signed)
Jenna @ [facility] called - they were referred by the hospital after her surgery. Asking if you are willing to sign off on patient's PT- 3 x weekly at home, working on balance, gait and strength. Please advise - thanks

## 2023-03-04 ENCOUNTER — Telehealth: Payer: Self-pay

## 2023-03-04 ENCOUNTER — Telehealth: Payer: Self-pay | Admitting: Podiatry

## 2023-03-04 DIAGNOSIS — A419 Sepsis, unspecified organism: Secondary | ICD-10-CM | POA: Diagnosis not present

## 2023-03-04 DIAGNOSIS — I110 Hypertensive heart disease with heart failure: Secondary | ICD-10-CM | POA: Diagnosis not present

## 2023-03-04 DIAGNOSIS — K632 Fistula of intestine: Secondary | ICD-10-CM | POA: Diagnosis not present

## 2023-03-04 DIAGNOSIS — M86171 Other acute osteomyelitis, right ankle and foot: Secondary | ICD-10-CM | POA: Diagnosis not present

## 2023-03-04 DIAGNOSIS — E1169 Type 2 diabetes mellitus with other specified complication: Secondary | ICD-10-CM | POA: Diagnosis not present

## 2023-03-04 DIAGNOSIS — E1122 Type 2 diabetes mellitus with diabetic chronic kidney disease: Secondary | ICD-10-CM | POA: Diagnosis not present

## 2023-03-04 DIAGNOSIS — Z4781 Encounter for orthopedic aftercare following surgical amputation: Secondary | ICD-10-CM | POA: Diagnosis not present

## 2023-03-04 DIAGNOSIS — Z89421 Acquired absence of other right toe(s): Secondary | ICD-10-CM | POA: Diagnosis not present

## 2023-03-04 DIAGNOSIS — I5032 Chronic diastolic (congestive) heart failure: Secondary | ICD-10-CM | POA: Diagnosis not present

## 2023-03-04 LAB — AEROBIC/ANAEROBIC CULTURE W GRAM STAIN (SURGICAL/DEEP WOUND)
Culture: NO GROWTH
Gram Stain: NONE SEEN
Gram Stain: NONE SEEN

## 2023-03-04 LAB — CULTURE, BLOOD (ROUTINE X 2)
Culture: NO GROWTH
Culture: NO GROWTH
Special Requests: ADEQUATE
Special Requests: ADEQUATE

## 2023-03-04 NOTE — Telephone Encounter (Signed)
Notified pt of what Dr Lilian Kapur said and explained he woul evaluate tomorrow. She said ok

## 2023-03-04 NOTE — Telephone Encounter (Signed)
Spoke with pt

## 2023-03-04 NOTE — Telephone Encounter (Signed)
Pt called and thinks she is having a allergic reaction to the antibiotic that she was sent home with. She is having diarrhea and itching. She was on doxycyline for a skin condition as well but you took her off of that and put her on a new antibiotic. She is asking if she could just take the doxycyline.She is scheduled to see you tomorrow. I did advise her to not take either until she hears back from the office.

## 2023-03-05 ENCOUNTER — Encounter: Payer: Self-pay | Admitting: Podiatry

## 2023-03-05 ENCOUNTER — Ambulatory Visit (INDEPENDENT_AMBULATORY_CARE_PROVIDER_SITE_OTHER): Payer: Medicare HMO | Admitting: Podiatry

## 2023-03-05 DIAGNOSIS — M869 Osteomyelitis, unspecified: Secondary | ICD-10-CM

## 2023-03-05 DIAGNOSIS — E1140 Type 2 diabetes mellitus with diabetic neuropathy, unspecified: Secondary | ICD-10-CM

## 2023-03-05 DIAGNOSIS — B351 Tinea unguium: Secondary | ICD-10-CM | POA: Diagnosis not present

## 2023-03-05 DIAGNOSIS — M79675 Pain in left toe(s): Secondary | ICD-10-CM | POA: Diagnosis not present

## 2023-03-05 DIAGNOSIS — L84 Corns and callosities: Secondary | ICD-10-CM

## 2023-03-05 DIAGNOSIS — Z794 Long term (current) use of insulin: Secondary | ICD-10-CM | POA: Diagnosis not present

## 2023-03-05 DIAGNOSIS — M86171 Other acute osteomyelitis, right ankle and foot: Secondary | ICD-10-CM

## 2023-03-05 DIAGNOSIS — M79674 Pain in right toe(s): Secondary | ICD-10-CM | POA: Diagnosis not present

## 2023-03-05 NOTE — Progress Notes (Signed)
  Subjective:  Patient ID: Kara [name], female    DOB: 04-26-1945,  MRN: [id number]  Chief Complaint  Patient presents with   Routine Post Op    POV#1 DOS 02/26/2023 SECOND DIGIT AMPUTATION (Right: Toe) "It's okay, I guess.  The antibiotic he prescribed, caused me to have diarrhea and nausea."   Toe Injury    "When I fell, I injured the little toe on my left foot.  They checked it in the hospital but didn't seem to be concerned about it."     77 y.o. female returns for post-op check.  She had significant nausea and vomiting from the antibiotics, she has stopped taking it and it is improving.  She would like to restart the doxycycline for her skin condition.  Her nails are also thick and elongated causing pain and discomfort and had not been In some time.  She has calluses in the tips of other toes.  Review of Systems: Negative except as noted in the HPI. Denies N/V/F/Ch.   Objective:  There were no vitals filed for this visit. There is no height or weight on file to calculate BMI. Constitutional Well developed. Well nourished.  Vascular Foot warm and well perfused. Capillary refill normal to all digits.  Calf is soft and supple, no posterior calf or knee pain, negative Homans' sign  Neurologic Normal speech. Oriented to person, place, and time. Epicritic sensation to light touch grossly reduced bilaterally.  Dermatologic Skin healing well without signs of infection. Skin edges well coapted without signs of infection.  Thickened elongated yellow dystrophic mycotic nails x 9 with hyperkeratotic lesions at the tip of the left fourth and right third toe  Orthopedic: Tenderness to palpation noted about the surgical site.   Culture with staph epi, path margins clean from osteomyelitis  Assessment:  No diagnosis found. Plan:  Patient was evaluated and treated and all questions answered.  S/p foot surgery right -Progressing as expected post-operatively. -WBAT in surgical shoe -May  begin showering and apply Neosporin to incision -Her incision is healing well and there are no major signs of infection.  Considering her intolerance to the amoxicillin she does not need to restart antibiotics for the foot.  She may restart her doxycycline although this will not cover staph epi  Recommended debridement of the nails today. Sharp and mechanical debridement performed of all painful and mycotic nails today. Nails debrided in length and thickness using a nail nipper to level of comfort. Follow up as needed for painful nails.  All symptomatic hyperkeratoses were safely debrided with a sterile #15 blade to patient's level of comfort without incident.   Return in about 3 weeks (around 03/26/2023) for suture removal.

## 2023-03-06 ENCOUNTER — Telehealth (INDEPENDENT_AMBULATORY_CARE_PROVIDER_SITE_OTHER): Payer: Medicare HMO | Admitting: Nurse Practitioner

## 2023-03-06 ENCOUNTER — Encounter: Payer: Self-pay | Admitting: Nurse Practitioner

## 2023-03-06 VITALS — Resp 16 | Ht 60.0 in | Wt 190.0 lb

## 2023-03-06 DIAGNOSIS — Z794 Long term (current) use of insulin: Secondary | ICD-10-CM | POA: Diagnosis not present

## 2023-03-06 DIAGNOSIS — Z79899 Other long term (current) drug therapy: Secondary | ICD-10-CM

## 2023-03-06 DIAGNOSIS — Z4781 Encounter for orthopedic aftercare following surgical amputation: Secondary | ICD-10-CM | POA: Diagnosis not present

## 2023-03-06 DIAGNOSIS — M869 Osteomyelitis, unspecified: Secondary | ICD-10-CM | POA: Diagnosis not present

## 2023-03-06 DIAGNOSIS — E1122 Type 2 diabetes mellitus with diabetic chronic kidney disease: Secondary | ICD-10-CM

## 2023-03-06 DIAGNOSIS — I110 Hypertensive heart disease with heart failure: Secondary | ICD-10-CM | POA: Diagnosis not present

## 2023-03-06 DIAGNOSIS — Z89421 Acquired absence of other right toe(s): Secondary | ICD-10-CM | POA: Diagnosis not present

## 2023-03-06 DIAGNOSIS — Z09 Encounter for follow-up examination after completed treatment for conditions other than malignant neoplasm: Secondary | ICD-10-CM

## 2023-03-06 DIAGNOSIS — M86171 Other acute osteomyelitis, right ankle and foot: Secondary | ICD-10-CM | POA: Diagnosis not present

## 2023-03-06 DIAGNOSIS — I5032 Chronic diastolic (congestive) heart failure: Secondary | ICD-10-CM | POA: Diagnosis not present

## 2023-03-06 DIAGNOSIS — K632 Fistula of intestine: Secondary | ICD-10-CM | POA: Diagnosis not present

## 2023-03-06 DIAGNOSIS — A419 Sepsis, unspecified organism: Secondary | ICD-10-CM | POA: Diagnosis not present

## 2023-03-06 DIAGNOSIS — E1169 Type 2 diabetes mellitus with other specified complication: Secondary | ICD-10-CM | POA: Diagnosis not present

## 2023-03-06 NOTE — Progress Notes (Signed)
[facility] [place], [place] [address] [place] [address] [phone]                                   Transitional Care Clinic   [facility] Discharge Acute Issues Care Follow Up                                                                        Patient Demographics  Kara [name], is a 77 y.o. female  DOB 08-19-1945  MRN [id number].  Primary MD  [name], NP  Date of admission: 02/23/2023             Date of discharge:  02/28/2023     Reason for TCC follow Up - Sepsis due to osteomyelitis of the right toe   Past Medical History:  Diagnosis Date   Anemia    Arthropathy    Benign neoplasm of breast    Breast cancer (HCC)    DCIS   Chest pain    CKD (chronic kidney disease), stage IV (HCC)    Diabetes (HCC)    Diabetic retinopathy (HCC)    Dyspnea    Edema    History of kidney stones    Hyperlipemia    Hypersomnia with sleep apnea    Hypertension    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HCC)    Lumbago    Neuropathy    Osteoarthrosis    Osteoporosis    Ovarian failure    Personal history of radiation therapy    Pneumonia due to COVID-19 virus 12/21/2021   a.) required inpatient admission   SOB (shortness of breath)     Past Surgical History:  Procedure Laterality Date   AMPUTATION TOE Right 02/26/2023   Procedure: SECOND DIGIT AMPUTATION;  Surgeon: Edwin Cap, DPM;  Location: [facility] ORS;  Service: Orthopedics/Podiatry;  Laterality: Right;   ANTERIOR LATERAL LUMBAR FUSION WITH PERCUTANEOUS SCREW 2 LEVEL N/A 07/03/2022   Procedure: L3-5 LATERAL LUMBAR INTERBODY FUSION WITH POSTERIOR SPINAL FUSION;  Surgeon: Venetia Night, MD;  Location: [facility] OR[facility];  Service: Neurosurgery;  Laterality: N/A;   APPLICATION OF INTRAOPERATIVE CT SCAN N/A 07/03/2022   Procedure: APPLICATION OF INTRAOPERATIVE CT SCAN;  Surgeon: Venetia Night, MD;  Location: [facility] OR[facility];  Service: Neurosurgery;  Laterality: N/A;   BILATERAL SALPINGECTOMY      BREAST BIOPSY Right 2014   neg- core   BREAST BIOPSY Right 12/21/2018   [place] bx, venus clip,  DUCTAL CARCINOMA IN SITU   BREAST BIOPSY Right 12/21/2018   [place] bx, ribbon clip,  FIBROEPITHELIAL PROLIFERATION WITH SCLEROSIS   BREAST BIOPSY Right 01/01/2019   Affirm bx "X" clip-path pending   BREAST DUCTAL SYSTEM EXCISION     BREAST EXCISIONAL BIOPSY Right 2014   neg   BREAST LUMPECTOMY Right 01/22/2019   path pending   CATARACT EXTRACTION     CHOLECYSTECTOMY     CYSTOSCOPY WITH STENT PLACEMENT Left 04/14/2015   Procedure: CYSTOSCOPY WITH STENT PLACEMENT;  Surgeon: Vanna Scotland, MD;  Location: [facility];  Service: Urology;  Laterality: Left;   EXTRACORPOREAL SHOCK WAVE LITHOTRIPSY Left 09/29/2014   Procedure: EXTRACORPOREAL SHOCK WAVE LITHOTRIPSY (ESWL);  Surgeon: Vanna Scotland, MD;  Location: [facility];  Service: Urology;  Laterality: Left;   EYE SURGERY     bilateral cataract   IRRIGATION AND DEBRIDEMENT ABSCESS Right 03/24/2019   Procedure: IRRIGATION AND DEBRIDEMENT ABSCESS RIGHT BREAST;  Surgeon: [name], MD;  Location: [facility];  Service: General;  Laterality: Right;   LAPAROSCOPIC OOPHERECTOMY Left    PARTIAL MASTECTOMY WITH NEEDLE LOCALIZATION Right 01/22/2019   Procedure: PARTIAL MASTECTOMY WITH NEEDLE LOCALIZATION;  Surgeon: [name], MD;  Location: [facility];  Service: General;  Laterality: Right;   TONSILLECTOMY     TUBAL LIGATION     URETEROSCOPY WITH HOLMIUM LASER LITHOTRIPSY Left 04/14/2015   Procedure: URETEROSCOPY WITH HOLMIUM LASER LITHOTRIPSY;  Surgeon: Vanna Scotland, MD;  Location: [facility];  Service: Urology;  Laterality: Left;       Recent HPI and Hospital Course  Hospital Course:  [name] is a 77 y.o. female with medical history significant of HTN, HDL, DM, diabetic neuropathy, dCHF, CKD-4, anemia, kidney stone, chronic back pain, right breast cancer-DCIS, essential tremor, chronic diabetic foot ulcer (right second toe) who  presents with abdominal pain, fever, fall.   Patient states that she has a remote history of abdominal surgery due to abdominal abscess, but does not remember what caused her abdominal abscess.  She has chronic lower abdominal pain for several months, which has been intermittent, sometimes becomes severe recently.  Currently her lower abdominal pain is mild, aching, nonradiating.  Not associated with nausea, vomiting or diarrhea.  Patient has fever and chills.  Patient denies chest pain, cough, SOB.  No symptoms of UTI. Pt has a chronic right second toe ulcer and following up with Dr. Allena Katz of podiatry.  Patient has generalized weakness.  She states that she has had 2 falls from low height. Pt fell off the toilet last night and slid out of a chair today.  Denies loss of consciousness or hitting her head.  She has pain in the low back and right hip and right knee.    Data reviewed independently and ED Course: pt was found to have WBC 10.6, lactic acid 1.9, negative urinalysis, negative COVID PCR, worsening renal function with creatinine 1.91, BUN 37, GFR 27 (recent baseline creatinine 1.35), temperature 103.2, blood pressure 111/89, heart rate 138 --> 95, RR 36 --> 23, oxygen sat 95% on room air.  Chest x-ray negative.  CT head negative.  X-ray of right and lower back and right hip/pelvis negative for acute injury. Patient is admitted to telemetry bed as inpatient.   CT scan of chest/abdomen/pelvis: 1. No acute intrathoracic or intra-abdominal pathology identified. 2. Mild coronary artery calcification. 3. Morphologic changes in keeping with pulmonary arterial hypertension. 4. Mild hepatomegaly and hepatic steatosis. 5. Severe sigmoid diverticulosis. Focal mural thickening and pericolonic infiltration along the mid sigmoid colon where an extraluminal contained collection of stool demonstrating a relatively thick rind demonstrates communication between several loops of a sigmoid colon in keeping with a  chronic colo-colonic fistula, likely the result of prior diverticulitis. An infiltrative malignancy is less likely, but not completely excluded. Correlation with colonoscopy may be helpful for further evaluation.     Assessment and Plan: # Sepsis streg agalactiae bacteremia due to osteomyelitis right second toe. Pt met criteria w/ leukocytosis, tachycardia, tachypnea, fever and bacteremia likely secondary to right 2nd toe osteomyelitis. S/p  IVF given. S/p IV zosyn. Prn pain meds.  On 11/7 started cefadroxil 500 mg p.o. twice daily. Repeat blood cxs NGTD. D/c on  cefadroxil 500 mg p.o. twice daily for 7 days as per ID # Osteomyelitis of right 2nd toe: MRI positive for second toe osteomyelitis and multiple other findings. S/p IV zosyn. Podiatry consulted, s/p amputation of second toe done on 11/6, recommended no dressing change until follow-up in 1 week as an outpatient after discharge and continue doxycycline for 10 days.  Weightbearing as tolerated with surgical shoe. ID consulted, Rec to D/c on cefadroxil 500 mg p.o. twice daily for 7 days   # Fall at home, initial encounter: CT head negative.  No acute injury on imaging of x-ray of right knee and right hip/pelvis.  PT/OT consulted, home health was arranged by TOC. # DM2: HbA1c 8.3, poorly controlled. Continue on glargine, SSI w/ accuchecks  # Hypokalemia: potassium repleted. K 3.2 40 meq x 1 dose given before discharge. # HLD: continue on statin  # Chronic diastolic CHF: echo on 12/25/2021 showd EF 60-85% with grade I diastolic dysfunction. Trace leg edema, but no pulm edema chest x-ray, BNP normal 63.  CHF is compensated.Holding lasix. Monitor I/Os  # CKDIV: baseline Cr 1.35-1.6. Cr is 1.73 trending up slightly, patient was advised to continue oral hydration.  Follow with PCP to repeat BMP in 1 week.  # IDA: continue on iron supplement  # Chronic bilateral low back pain with left-sided sciatica: Ultracet was not helping which was discontinued and  prescribed Dilaudid 2 mg p.o. every 6 hourly as needed. # Thrombocytopenia: etiology unclear. Plts 133k Stable  # Diverticulosis: Severe sigmoid diverticulosis. 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 An infiltrative malignancy is less likely, but not completely excluded. Correlation with colonoscopy may be helpful for further evaluation. Colonoscopy can be done as an outpatient    # Chronic diarrhea, patient uses Imodium prn at home.  Follow with PCP for further management. # Obesity class II Body mass index is 37.85 kg/m.  Interventions: Calorie restricted diet and daily exercise advised to lose body weight.  Lifestyle modification discussed.       Pain control  - [employer] Controlled Substance Reporting System database could not be reviewed, because website was not working.. - 5 day supply was provided. - Patient was instructed, not to drive, operate heavy machinery, perform activities at heights, swimming or participation in water activities or provide baby sitting services while on Pain, Sleep and Anxiety Medications; until her outpatient Physician has advised to do so again.  - Also recommended to not to take more than prescribed Pain, Sleep and Anxiety Medications.   Patient was seen by physical therapy, who recommended Home health, which was arranged. On the day of the discharge the patient's vitals were stable, and no other acute medical condition were reported by patient. the patient was felt safe to be discharge at Home with Home health.  Post Hospital Acute Care Issue to be followed in the Clinic   Principal Problem:   Toe osteomyelitis, right [facility]) Active Problems:   Sepsis (HCC)    Fistula of sigmoid colon   Fall at home, initial encounter   Type II diabetes mellitus with renal manifestations (HCC)   HLD (hyperlipidemia)   Chronic diastolic CHF (congestive heart failure) (HCC)   CKD (chronic kidney disease), stage IV (HCC)   Iron deficiency anemia   Diabetic  foot ulcer_ right second toe   Essential hypertension   Chronic bilateral low back pain with left-sided sciatica   Contusion of lesser toe of left foot without damage to nail   Acute osteomyelitis of toe, right (HCC)   History of amputation of lesser toe of right foot (HCC)   Subjective:   Jone Baseman today has, No headache, No chest pain, No abdominal pain - No Nausea, No new weakness tingling or numbness, No Cough - SOB.   Assessment & Plan   1. Hospital discharge follow-up [Z09] (Primary) Repeat labs ordered  - CBC with Differential/Platelet - CMP14+EGFR  2. Toe osteomyelitis, right (HCC) Right toe was amputated during hospital stay, has home health nursing and physical therapy.   3. Type 2 diabetes mellitus with chronic kidney disease, with long-term current use of insulin, unspecified CKD stage [facility]) Repeat labs ordered per hospitalist recommendation. Medication refills ordered.  - CBC with Differential/Platelet - CMP14+EGFR - Dulaglutide (TRULICITY) 1.5 MG/0.5ML SOAJ; Inject 1.5 mg into the skin once a week.  Dispense: 6 mL; Refill: 4 - insulin glargine (LANTUS) 100 UNIT/ML Solostar Pen; Inject 40 Units into the skin daily.  Dispense: 15 mL; Refill: 11  4. Encounter for medication review Medication list reviewed, updated and ordered.  - doxycycline (MONODOX) 100 MG capsule    Reason for frequent admissions/ER visits    diabetes Infection    Objective:   Vitals:   03/06/23 1415  Resp: 16  Weight: 190 lb (86.2 kg)  Height: 5' (1.524 m)    Wt Readings from Last 3 Encounters:  03/06/23 190 lb (86.2 kg)  02/28/23 193 lb 12.6 oz (87.9 kg)  12/24/22 186 lb (84.4 kg)     Allergies as of 03/06/2023       Reactions   Amoxicillin Nausea And Vomiting        Medication List        Accurate as of March 06, 2023  3:13 PM. If you have any questions, ask your nurse or doctor.          STOP taking these medications    glipiZIDE 2.5 MG 24 hr tablet Commonly known as: GLUCOTROL XL Stopped by: [name]       TAKE these medications    acetaminophen 325 MG tablet Commonly known as: TYLENOL Take 2 tablets (650 mg total) by mouth 3 (three) times daily.   CALCIUM 1200+D3 PO Take 1 tablet by mouth daily.   cefadroxil 500 MG capsule Commonly known as: DURICEF Take 1 capsule (500 mg total) by mouth 2 (two) times daily for 7 days.   doxycycline 100 MG capsule Commonly known as: MONODOX   Eylea 2 MG/0.05ML Soln Generic drug: Aflibercept   Ferrous Gluconate 324 (37.5 Fe) MG Tabs Take 324 mg by mouth daily.   furosemide 20 MG tablet Commonly known as: LASIX Take 2 tablets by mouth in the AM and take 1 tablet by mouth in the PM. May add 1 tablet to afternoon dose if swelling not improved.   gentamicin cream 0.1 % Commonly known as: GARAMYCIN Apply to ulcers once daily.   hydrocortisone 2.5 % cream APPLY TO AFFECTED AREAS BUTTOCKS ONCE TO TWICE DAILY UNTIL IMPROVED.   ketoconazole 2 % cream Commonly known as: NIZORAL Apply to affected area buttocks twice daily until improved.   Krill Oil 500 MG Caps Take 1 capsule by mouth daily.   Levemir FlexPen 100 UNIT/ML FlexPen Generic drug: insulin detemir INJECT UP TO 75 UNITS UNDER THE SKIN  DAILY, TITRATE AS NEEDED. What changed:  how much to take when to take this   Move Free Ultra Joint Health 40-5-3.3 MG Tabs Generic drug: Collagen-Boron-Hyaluronic Acid Take 1 tablet by mouth daily.   multivitamin tablet Take 1 tablet by mouth daily at 12 noon.   NovoLOG FlexPen 100 UNIT/ML FlexPen Generic drug: insulin aspart INJECT UNDER THE SKIN TWICE DAILY AS DIRECTED PER  SLIDING SCALE. MAX DAILY DOSE IS 30 UNITS.   NovoLOG FlexPen 100 UNIT/ML FlexPen Generic drug: insulin aspart INJECT [place] 3 times daily with meals AS DIRECTED PER SLIDING SCALE. MAX DAILY DOSE IS 30 UNITS. DX E11.65.   pantoprazole 40 MG tablet Commonly known as: PROTONIX TAKE 1 TABLET BY MOUTH EVERY DAY   pregabalin 100 MG capsule Commonly known as: LYRICA Take 1 capsule (100 mg total) by mouth 2 (two) times daily.   Probiotic Acidophilus Caps Take 1 capsule by mouth daily.   rosuvastatin 20 MG tablet Commonly known as: CRESTOR TAKE 1 TABLET BY MOUTH EVERYDAY AT BEDTIME   tamoxifen 20 MG tablet Commonly known as: NOLVADEX Take 1 tablet (20 mg total) by mouth daily.   triamcinolone 0.025 % cream Commonly known as: KENALOG Apply 1 Application topically 2 (two) times daily as needed (itching). To groin area   Trulicity 1.5 MG/0.5ML Soaj Generic drug: Dulaglutide Inject 1.5 mg into the skin once a week. What changed: additional instructions         Objective data: Patient is alert and oriented. No acute distress noted.   Data Review   Micro Results Recent Results (from the past 240 hour(s))  Aerobic/Anaerobic Culture w Gram Stain (surgical/deep wound)     Status: None   Collection Time: 02/26/23  5:31 PM   Specimen: PATH Bone resection; Tissue  Result Value Ref Range Status   Specimen Description TISSUE  Final   Special Requests RIGHT SECOND TOE BONE CULT SPEC A  Final   Gram Stain NO WBC SEEN NO ORGANISMS SEEN   Final   Culture   Final    RARE STAPHYLOCOCCUS EPIDERMIDIS NO ANAEROBES ISOLATED Performed at [facility], [address] [place], [place] [address]    Report Status 03/04/2023 FINAL  Final   Organism ID, Bacteria STAPHYLOCOCCUS EPIDERMIDIS  Final      Susceptibility   Staphylococcus epidermidis - MIC*    CIPROFLOXACIN <=0.5 SENSITIVE Sensitive     ERYTHROMYCIN >=8 RESISTANT Resistant     GENTAMICIN <=0.5 SENSITIVE Sensitive     OXACILLIN  <=0.25 SENSITIVE Sensitive     TETRACYCLINE >=16 RESISTANT Resistant     VANCOMYCIN 1 SENSITIVE Sensitive     TRIMETH/SULFA 160 RESISTANT Resistant     CLINDAMYCIN <=0.25 SENSITIVE Sensitive     RIFAMPIN <=0.5 SENSITIVE Sensitive     Inducible Clindamycin NEGATIVE Sensitive     * RARE STAPHYLOCOCCUS EPIDERMIDIS  Aerobic/Anaerobic Culture w Gram Stain (surgical/deep wound)     Status: None   Collection Time: 02/26/23  5:32 PM   Specimen: Path Tissue  Result Value Ref Range Status   Specimen Description WOUND  Final   Special Requests RIGHT SECOND TOE WD  Final   Gram Stain NO WBC SEEN NO ORGANISMS SEEN   Final   Culture   Final    No growth aerobically or anaerobically. Performed at [facility], [address] [place], [place] [address]    Report Status 03/04/2023 FINAL  Final  Culture, blood (Routine X 2) w Reflex to ID Panel  Status: None   Collection Time: 02/27/23  5:32 AM   Specimen: BLOOD LEFT HAND  Result Value Ref Range Status   Specimen Description BLOOD LEFT HAND  Final   Special Requests   Final    BOTTLES DRAWN AEROBIC AND ANAEROBIC Blood Culture adequate volume   Culture   Final    NO GROWTH 5 DAYS Performed at [facility], [address], [place], [place] [address]    Report Status 03/04/2023 FINAL  Final  Culture, blood (Routine X 2) w Reflex to ID Panel     Status: None   Collection Time: 02/27/23  5:43 AM   Specimen: BLOOD LEFT ARM  Result Value Ref Range Status   Specimen Description BLOOD LEFT ARM  Final   Special Requests   Final    BOTTLES DRAWN AEROBIC AND ANAEROBIC Blood Culture adequate volume   Culture   Final    NO GROWTH 5 DAYS Performed at [facility], [address] [place], [place] [address]    Report Status 03/04/2023 FINAL  Final     CBC Recent Labs  Lab 02/28/23 0246  WBC 5.4  HGB 8.0*  HCT 26.0*  PLT 133*  MCV 86.4  MCH 26.6  MCHC 30.8  RDW 17.4*    Chemistries  Recent Labs  Lab  02/28/23 0246  NA 138  K 3.2*  CL 108  CO2 24  GLUCOSE 140*  BUN 23  CREATININE 1.73*  CALCIUM 8.1*  MG 2.0   ------------------------------------------------------------------------------------------------------------------ estimated creatinine clearance is 27 mL/min (A) (by C-G formula based on SCr of 1.73 mg/dL (H)). ------------------------------------------------------------------------------------------------------------------ No results for input(s): "HGBA1C" in the last 72 hours. ------------------------------------------------------------------------------------------------------------------ No results for input(s): "CHOL", "HDL", "LDLCALC", "TRIG", "CHOLHDL", "LDLDIRECT" in the last 72 hours. ------------------------------------------------------------------------------------------------------------------ No results for input(s): "TSH", "T4TOTAL", "T3FREE", "THYROIDAB" in the last 72 hours.  Invalid input(s): "FREET3" ------------------------------------------------------------------------------------------------------------------ No results for input(s): "VITAMINB12", "FOLATE", "FERRITIN", "TIBC", "IRON", "RETICCTPCT" in the last 72 hours.  Coagulation profile No results for input(s): "INR", "PROTIME" in the last 168 hours.  No results for input(s): "DDIMER" in the last 72 hours.  Cardiac Enzymes No results for input(s): "CKMB", "TROPONINI", "MYOGLOBIN" in the last 168 hours.  Invalid input(s): "CK" ------------------------------------------------------------------------------------------------------------------ Invalid input(s): "POCBNP"  Return in about 1 month (around 04/05/2023) for F/U, Ryott Rafferty PCP diabetes and sugars .   Time Spent in minutes  45 Time spent with patient included reviewing progress notes, labs, imaging studies, and discussing plan for follow up.   This patient was seen by [name], FNP-C in collaboration with Dr. Beverely Risen as a part of  collaborative care agreement.   [name] MSN, FNP-C on 03/06/2023 at 3:13 PM   **Disclaimer: This note may have been dictated with voice recognition software. Similar sounding words can inadvertently be transcribed and this note may contain transcription errors which may not have been corrected upon publication of note.**

## 2023-03-10 ENCOUNTER — Telehealth: Payer: Self-pay

## 2023-03-10 MED ORDER — TRULICITY 1.5 MG/0.5ML ~~LOC~~ SOAJ: 1.5000 mg | SUBCUTANEOUS | 4 refills | Status: DC

## 2023-03-11 DIAGNOSIS — K632 Fistula of intestine: Secondary | ICD-10-CM | POA: Diagnosis not present

## 2023-03-11 DIAGNOSIS — E1122 Type 2 diabetes mellitus with diabetic chronic kidney disease: Secondary | ICD-10-CM | POA: Diagnosis not present

## 2023-03-11 DIAGNOSIS — I110 Hypertensive heart disease with heart failure: Secondary | ICD-10-CM | POA: Diagnosis not present

## 2023-03-11 DIAGNOSIS — Z89421 Acquired absence of other right toe(s): Secondary | ICD-10-CM | POA: Diagnosis not present

## 2023-03-11 DIAGNOSIS — E1169 Type 2 diabetes mellitus with other specified complication: Secondary | ICD-10-CM | POA: Diagnosis not present

## 2023-03-11 DIAGNOSIS — M86171 Other acute osteomyelitis, right ankle and foot: Secondary | ICD-10-CM | POA: Diagnosis not present

## 2023-03-11 DIAGNOSIS — Z4781 Encounter for orthopedic aftercare following surgical amputation: Secondary | ICD-10-CM | POA: Diagnosis not present

## 2023-03-11 DIAGNOSIS — A419 Sepsis, unspecified organism: Secondary | ICD-10-CM | POA: Diagnosis not present

## 2023-03-11 DIAGNOSIS — I5032 Chronic diastolic (congestive) heart failure: Secondary | ICD-10-CM | POA: Diagnosis not present

## 2023-03-13 ENCOUNTER — Ambulatory Visit: Payer: Medicare HMO | Admitting: Nurse Practitioner

## 2023-03-13 DIAGNOSIS — I5032 Chronic diastolic (congestive) heart failure: Secondary | ICD-10-CM | POA: Diagnosis not present

## 2023-03-13 DIAGNOSIS — M86171 Other acute osteomyelitis, right ankle and foot: Secondary | ICD-10-CM | POA: Diagnosis not present

## 2023-03-13 DIAGNOSIS — Z4781 Encounter for orthopedic aftercare following surgical amputation: Secondary | ICD-10-CM | POA: Diagnosis not present

## 2023-03-13 DIAGNOSIS — A419 Sepsis, unspecified organism: Secondary | ICD-10-CM | POA: Diagnosis not present

## 2023-03-13 DIAGNOSIS — Z89421 Acquired absence of other right toe(s): Secondary | ICD-10-CM | POA: Diagnosis not present

## 2023-03-13 DIAGNOSIS — E1169 Type 2 diabetes mellitus with other specified complication: Secondary | ICD-10-CM | POA: Diagnosis not present

## 2023-03-13 DIAGNOSIS — K632 Fistula of intestine: Secondary | ICD-10-CM | POA: Diagnosis not present

## 2023-03-13 DIAGNOSIS — E1122 Type 2 diabetes mellitus with diabetic chronic kidney disease: Secondary | ICD-10-CM | POA: Diagnosis not present

## 2023-03-13 DIAGNOSIS — I110 Hypertensive heart disease with heart failure: Secondary | ICD-10-CM | POA: Diagnosis not present

## 2023-03-14 MED ORDER — INSULIN GLARGINE 100 UNIT/ML SOLOSTAR PEN: 40.0000 [IU] | PEN_INJECTOR | Freq: Every day | SUBCUTANEOUS | 11 refills | Status: DC

## 2023-03-14 NOTE — Telephone Encounter (Signed)
Medicine was sent in by [facility] and patient was notified.

## 2023-03-18 DIAGNOSIS — M86171 Other acute osteomyelitis, right ankle and foot: Secondary | ICD-10-CM | POA: Diagnosis not present

## 2023-03-18 DIAGNOSIS — A419 Sepsis, unspecified organism: Secondary | ICD-10-CM | POA: Diagnosis not present

## 2023-03-18 DIAGNOSIS — I5032 Chronic diastolic (congestive) heart failure: Secondary | ICD-10-CM | POA: Diagnosis not present

## 2023-03-18 DIAGNOSIS — E1169 Type 2 diabetes mellitus with other specified complication: Secondary | ICD-10-CM | POA: Diagnosis not present

## 2023-03-18 DIAGNOSIS — Z89421 Acquired absence of other right toe(s): Secondary | ICD-10-CM | POA: Diagnosis not present

## 2023-03-18 DIAGNOSIS — K632 Fistula of intestine: Secondary | ICD-10-CM | POA: Diagnosis not present

## 2023-03-18 DIAGNOSIS — Z4781 Encounter for orthopedic aftercare following surgical amputation: Secondary | ICD-10-CM | POA: Diagnosis not present

## 2023-03-18 DIAGNOSIS — E1122 Type 2 diabetes mellitus with diabetic chronic kidney disease: Secondary | ICD-10-CM | POA: Diagnosis not present

## 2023-03-18 DIAGNOSIS — I110 Hypertensive heart disease with heart failure: Secondary | ICD-10-CM | POA: Diagnosis not present

## 2023-03-21 DIAGNOSIS — Z4781 Encounter for orthopedic aftercare following surgical amputation: Secondary | ICD-10-CM | POA: Diagnosis not present

## 2023-03-21 DIAGNOSIS — I110 Hypertensive heart disease with heart failure: Secondary | ICD-10-CM | POA: Diagnosis not present

## 2023-03-21 DIAGNOSIS — K632 Fistula of intestine: Secondary | ICD-10-CM | POA: Diagnosis not present

## 2023-03-21 DIAGNOSIS — I5032 Chronic diastolic (congestive) heart failure: Secondary | ICD-10-CM | POA: Diagnosis not present

## 2023-03-21 DIAGNOSIS — A419 Sepsis, unspecified organism: Secondary | ICD-10-CM | POA: Diagnosis not present

## 2023-03-21 DIAGNOSIS — Z89421 Acquired absence of other right toe(s): Secondary | ICD-10-CM | POA: Diagnosis not present

## 2023-03-21 DIAGNOSIS — M86171 Other acute osteomyelitis, right ankle and foot: Secondary | ICD-10-CM | POA: Diagnosis not present

## 2023-03-21 DIAGNOSIS — E1169 Type 2 diabetes mellitus with other specified complication: Secondary | ICD-10-CM | POA: Diagnosis not present

## 2023-03-21 DIAGNOSIS — E1122 Type 2 diabetes mellitus with diabetic chronic kidney disease: Secondary | ICD-10-CM | POA: Diagnosis not present

## 2023-03-24 DIAGNOSIS — E1169 Type 2 diabetes mellitus with other specified complication: Secondary | ICD-10-CM | POA: Diagnosis not present

## 2023-03-24 DIAGNOSIS — A419 Sepsis, unspecified organism: Secondary | ICD-10-CM | POA: Diagnosis not present

## 2023-03-24 DIAGNOSIS — M86171 Other acute osteomyelitis, right ankle and foot: Secondary | ICD-10-CM | POA: Diagnosis not present

## 2023-03-24 DIAGNOSIS — E1122 Type 2 diabetes mellitus with diabetic chronic kidney disease: Secondary | ICD-10-CM | POA: Diagnosis not present

## 2023-03-24 DIAGNOSIS — Z89421 Acquired absence of other right toe(s): Secondary | ICD-10-CM | POA: Diagnosis not present

## 2023-03-24 DIAGNOSIS — K632 Fistula of intestine: Secondary | ICD-10-CM | POA: Diagnosis not present

## 2023-03-24 DIAGNOSIS — Z4781 Encounter for orthopedic aftercare following surgical amputation: Secondary | ICD-10-CM | POA: Diagnosis not present

## 2023-03-24 DIAGNOSIS — I5032 Chronic diastolic (congestive) heart failure: Secondary | ICD-10-CM | POA: Diagnosis not present

## 2023-03-24 DIAGNOSIS — I110 Hypertensive heart disease with heart failure: Secondary | ICD-10-CM | POA: Diagnosis not present

## 2023-03-25 DIAGNOSIS — H34832 Tributary (branch) retinal vein occlusion, left eye, with macular edema: Secondary | ICD-10-CM | POA: Diagnosis not present

## 2023-03-26 ENCOUNTER — Ambulatory Visit (INDEPENDENT_AMBULATORY_CARE_PROVIDER_SITE_OTHER): Payer: Medicare HMO | Admitting: Podiatry

## 2023-03-26 ENCOUNTER — Encounter: Payer: Self-pay | Admitting: Podiatry

## 2023-03-26 VITALS — Ht 60.0 in | Wt 190.0 lb

## 2023-03-26 DIAGNOSIS — M2041 Other hammer toe(s) (acquired), right foot: Secondary | ICD-10-CM

## 2023-03-26 DIAGNOSIS — M869 Osteomyelitis, unspecified: Secondary | ICD-10-CM

## 2023-03-26 NOTE — Progress Notes (Signed)
  Subjective:  Patient ID: Kara [name], female    DOB: Jun 04, 1945,  MRN: [id number]  Chief Complaint  Patient presents with   Routine Post Op    POV#2:   DOS 02/26/2023 SECOND DIGIT AMPUTATION (Right: Toe) Foot looks good patient would like nails trimmed, and concerned With curvature of toes on left foot as well     76 y.o. female returns for post-op check.    Review of Systems: Negative except as noted in the HPI. Denies N/V/F/Ch.   Objective:  There were no vitals filed for this visit. Body mass index is 37.11 kg/m. Constitutional Well developed. Well nourished.  Vascular Foot warm and well perfused. Capillary refill normal to all digits.  Calf is soft and supple, no posterior calf or knee pain, negative Homans' sign  Neurologic Normal speech. Oriented to person, place, and time. Epicritic sensation to light touch grossly reduced bilaterally.  Dermatologic Incision is well-healed not hypertrophic.   Orthopedic: She has no pain to palpation noted about the surgical site.   Culture with staph epi, path margins clean from osteomyelitis  Assessment:   1. Osteomyelitis of second toe of right foot (HCC)   2. Hammertoe of right foot    Plan:  Patient was evaluated and treated and all questions answered.  S/p foot surgery right -Incision is well held and not hypertrophic.  There are no signs of infection  Hammertoes of right foot Has residual flexible hammertoes of the right foot.  We discussed further treatment including flexor tenotomy.  She will be scheduled for this in 2 weeks with me here in the office  We discussed that at her last visit I debrided her nails, currently today she says they are not hurting, and this is a covered benefit only every 9 weeks.  Discussed that this could be done prior to this but likely would not be covered by insurance and would be an out-of-pocket cost.  She preferred not to do this today.  Return in about 2 weeks (around 04/09/2023) for  flexor tenotomy toes 3/4/5 on R.

## 2023-03-27 ENCOUNTER — Ambulatory Visit: Payer: Medicare HMO | Admitting: Podiatry

## 2023-03-27 DIAGNOSIS — I5032 Chronic diastolic (congestive) heart failure: Secondary | ICD-10-CM | POA: Diagnosis not present

## 2023-03-27 DIAGNOSIS — E1122 Type 2 diabetes mellitus with diabetic chronic kidney disease: Secondary | ICD-10-CM | POA: Diagnosis not present

## 2023-03-27 DIAGNOSIS — A419 Sepsis, unspecified organism: Secondary | ICD-10-CM | POA: Diagnosis not present

## 2023-03-27 DIAGNOSIS — E1169 Type 2 diabetes mellitus with other specified complication: Secondary | ICD-10-CM | POA: Diagnosis not present

## 2023-03-27 DIAGNOSIS — M86171 Other acute osteomyelitis, right ankle and foot: Secondary | ICD-10-CM | POA: Diagnosis not present

## 2023-03-27 DIAGNOSIS — Z4781 Encounter for orthopedic aftercare following surgical amputation: Secondary | ICD-10-CM | POA: Diagnosis not present

## 2023-03-27 DIAGNOSIS — I110 Hypertensive heart disease with heart failure: Secondary | ICD-10-CM | POA: Diagnosis not present

## 2023-03-27 DIAGNOSIS — Z89421 Acquired absence of other right toe(s): Secondary | ICD-10-CM | POA: Diagnosis not present

## 2023-03-27 DIAGNOSIS — K632 Fistula of intestine: Secondary | ICD-10-CM | POA: Diagnosis not present

## 2023-04-01 DIAGNOSIS — E1122 Type 2 diabetes mellitus with diabetic chronic kidney disease: Secondary | ICD-10-CM | POA: Diagnosis not present

## 2023-04-01 DIAGNOSIS — Z4781 Encounter for orthopedic aftercare following surgical amputation: Secondary | ICD-10-CM | POA: Diagnosis not present

## 2023-04-01 DIAGNOSIS — K632 Fistula of intestine: Secondary | ICD-10-CM | POA: Diagnosis not present

## 2023-04-01 DIAGNOSIS — Z89421 Acquired absence of other right toe(s): Secondary | ICD-10-CM | POA: Diagnosis not present

## 2023-04-01 DIAGNOSIS — I5032 Chronic diastolic (congestive) heart failure: Secondary | ICD-10-CM | POA: Diagnosis not present

## 2023-04-01 DIAGNOSIS — I110 Hypertensive heart disease with heart failure: Secondary | ICD-10-CM | POA: Diagnosis not present

## 2023-04-01 DIAGNOSIS — E1169 Type 2 diabetes mellitus with other specified complication: Secondary | ICD-10-CM | POA: Diagnosis not present

## 2023-04-01 DIAGNOSIS — A419 Sepsis, unspecified organism: Secondary | ICD-10-CM | POA: Diagnosis not present

## 2023-04-01 DIAGNOSIS — M86171 Other acute osteomyelitis, right ankle and foot: Secondary | ICD-10-CM | POA: Diagnosis not present

## 2023-04-07 ENCOUNTER — Ambulatory Visit (INDEPENDENT_AMBULATORY_CARE_PROVIDER_SITE_OTHER): Payer: Medicare HMO | Admitting: Nurse Practitioner

## 2023-04-07 ENCOUNTER — Encounter: Payer: Self-pay | Admitting: Nurse Practitioner

## 2023-04-07 VITALS — BP 135/68 | Temp 98.4°F | Resp 16 | Ht 60.0 in | Wt 182.6 lb

## 2023-04-07 DIAGNOSIS — Z1212 Encounter for screening for malignant neoplasm of rectum: Secondary | ICD-10-CM | POA: Diagnosis not present

## 2023-04-07 DIAGNOSIS — E1142 Type 2 diabetes mellitus with diabetic polyneuropathy: Secondary | ICD-10-CM

## 2023-04-07 DIAGNOSIS — I10 Essential (primary) hypertension: Secondary | ICD-10-CM | POA: Diagnosis not present

## 2023-04-07 DIAGNOSIS — Z1211 Encounter for screening for malignant neoplasm of colon: Secondary | ICD-10-CM | POA: Diagnosis not present

## 2023-04-07 DIAGNOSIS — Z794 Long term (current) use of insulin: Secondary | ICD-10-CM | POA: Diagnosis not present

## 2023-04-07 DIAGNOSIS — E1122 Type 2 diabetes mellitus with diabetic chronic kidney disease: Secondary | ICD-10-CM

## 2023-04-07 LAB — POCT GLYCOSYLATED HEMOGLOBIN (HGB A1C): Hemoglobin A1C: 7.3 % — AB (ref 4.0–5.6)

## 2023-04-07 NOTE — Progress Notes (Signed)
[facility] [address] [place], [place] [address]  Internal MEDICINE  Office Visit Note  Patient Name: Kara [name]  [id number]  [id number]  Date of Service: 04/07/2023  Chief Complaint  Patient presents with   Diabetes   Hypertension   Hyperlipidemia   Follow-up   Quality Metric Gaps    Colonoscopy    HPI Kara [name] presents for a follow-up visit for diabetes, hypertension, CRC screening. Diabetes -- A1c improving from 8.3 down to 7.3 today. Still has some levemir that she is finishing up, but will switch to insulin glargine when she runs out.  Hypertension -- BP initially elevated, rechecked and improved  Switch pharmacies in January due to insurance change.  FMLA paperwork for her son who has to leave work to help her and transport her to her appointments.  Due for cologuard test     Current Medication: Outpatient Encounter Medications as of 04/07/2023  Medication Sig Note   acetaminophen (TYLENOL) 325 MG tablet Take 2 tablets (650 mg total) by mouth 3 (three) times daily.    Aflibercept (EYLEA) 2 MG/0.05ML SOLN  12/22/2021: Every 3 months   Calcium-Magnesium-Vitamin D (CALCIUM 1200+D3 PO) Take 1 tablet by mouth daily.    Collagen-Boron-Hyaluronic Acid (MOVE FREE ULTRA JOINT HEALTH) 40-5-3.3 MG TABS Take 1 tablet by mouth daily.    doxycycline (MONODOX) 100 MG capsule     Dulaglutide (TRULICITY) 1.5 MG/0.5ML SOAJ Inject 1.5 mg into the skin once a week.    Ferrous Gluconate 324 (37.5 Fe) MG TABS Take 324 mg by mouth daily.    furosemide (LASIX) 20 MG tablet Take 2 tablets by mouth in the AM and take 1 tablet by mouth in the PM. May add 1 tablet to afternoon dose if swelling not improved.    gentamicin cream (GARAMYCIN) 0.1 % Apply to ulcers once daily.    hydrocortisone 2.5 % cream APPLY TO AFFECTED AREAS BUTTOCKS ONCE TO TWICE DAILY UNTIL IMPROVED.    insulin aspart (NOVOLOG FLEXPEN) 100 UNIT/ML FlexPen INJECT UNDER THE SKIN TWICE DAILY AS DIRECTED PER SLIDING  SCALE. MAX DAILY DOSE IS 30 UNITS.    insulin aspart (NOVOLOG FLEXPEN) 100 UNIT/ML FlexPen INJECT [place] 3 times daily with meals AS DIRECTED PER SLIDING SCALE. MAX DAILY DOSE IS 30 UNITS. DX E11.65.    insulin glargine (LANTUS) 100 UNIT/ML Solostar Pen Inject 40 Units into the skin daily.    ketoconazole (NIZORAL) 2 % cream Apply to affected area buttocks twice daily until improved.    Krill Oil 500 MG CAPS Take 1 capsule by mouth daily.    Lactobacillus (PROBIOTIC ACIDOPHILUS) CAPS Take 1 capsule by mouth daily.    Multiple Vitamin (MULTIVITAMIN) tablet Take 1 tablet by mouth daily at 12 noon.    pantoprazole (PROTONIX) 40 MG tablet TAKE 1 TABLET BY MOUTH EVERY DAY    pregabalin (LYRICA) 100 MG capsule Take 1 capsule (100 mg total) by mouth 2 (two) times daily.    rosuvastatin (CRESTOR) 20 MG tablet TAKE 1 TABLET BY MOUTH EVERYDAY AT BEDTIME    tamoxifen (NOLVADEX) 20 MG tablet Take 1 tablet (20 mg total) by mouth daily.    triamcinolone (KENALOG) 0.025 % cream Apply 1 Application topically 2 (two) times daily as needed (itching). To groin area    No facility-administered encounter medications on file as of 04/07/2023.    Surgical History: Past Surgical History:  Procedure Laterality Date   AMPUTATION TOE Right 02/26/2023   Procedure: SECOND DIGIT AMPUTATION;  Surgeon: Lilian Kapur,  Rachelle Hora, DPM;  Location: [facility];  Service: Orthopedics/Podiatry;  Laterality: Right;   ANTERIOR LATERAL LUMBAR FUSION WITH PERCUTANEOUS SCREW 2 LEVEL N/A 07/03/2022   Procedure: L3-5 LATERAL LUMBAR INTERBODY FUSION WITH POSTERIOR SPINAL FUSION;  Surgeon: Venetia Night, MD;  Location: [facility];  Service: Neurosurgery;  Laterality: N/A;   APPLICATION OF INTRAOPERATIVE CT SCAN N/A 07/03/2022   Procedure: APPLICATION OF INTRAOPERATIVE CT SCAN;  Surgeon: Venetia Night, MD;  Location: [facility];  Service: Neurosurgery;  Laterality: N/A;   BILATERAL SALPINGECTOMY     BREAST BIOPSY Right 2014   neg- core   BREAST  BIOPSY Right 12/21/2018   [place] bx, venus clip,  DUCTAL CARCINOMA IN SITU   BREAST BIOPSY Right 12/21/2018   [place] bx, ribbon clip,  FIBROEPITHELIAL PROLIFERATION WITH SCLEROSIS   BREAST BIOPSY Right 01/01/2019   Affirm bx "X" clip-path pending   BREAST DUCTAL SYSTEM EXCISION     BREAST EXCISIONAL BIOPSY Right 2014   neg   BREAST LUMPECTOMY Right 01/22/2019   path pending   CATARACT EXTRACTION     CHOLECYSTECTOMY     CYSTOSCOPY WITH STENT PLACEMENT Left 04/14/2015   Procedure: CYSTOSCOPY WITH STENT PLACEMENT;  Surgeon: Vanna Scotland, MD;  Location: [facility];  Service: Urology;  Laterality: Left;   EXTRACORPOREAL SHOCK WAVE LITHOTRIPSY Left 09/29/2014   Procedure: EXTRACORPOREAL SHOCK WAVE LITHOTRIPSY (ESWL);  Surgeon: Vanna Scotland, MD;  Location: [facility];  Service: Urology;  Laterality: Left;   EYE SURGERY     bilateral cataract   IRRIGATION AND DEBRIDEMENT ABSCESS Right 03/24/2019   Procedure: IRRIGATION AND DEBRIDEMENT ABSCESS RIGHT BREAST;  Surgeon: [name], MD;  Location: [facility];  Service: General;  Laterality: Right;   LAPAROSCOPIC OOPHERECTOMY Left    PARTIAL MASTECTOMY WITH NEEDLE LOCALIZATION Right 01/22/2019   Procedure: PARTIAL MASTECTOMY WITH NEEDLE LOCALIZATION;  Surgeon: [name], MD;  Location: [facility];  Service: General;  Laterality: Right;   TONSILLECTOMY     TUBAL LIGATION     URETEROSCOPY WITH HOLMIUM LASER LITHOTRIPSY Left 04/14/2015   Procedure: URETEROSCOPY WITH HOLMIUM LASER LITHOTRIPSY;  Surgeon: Vanna Scotland, MD;  Location: [facility];  Service: Urology;  Laterality: Left;    Medical History: Past Medical History:  Diagnosis Date   Anemia    Arthropathy    Benign neoplasm of breast    Breast cancer (HCC)    DCIS   Chest pain    CKD (chronic kidney disease), stage IV (HCC)    Diabetes (HCC)    Diabetic retinopathy (HCC)    Dyspnea    Edema    History of kidney stones    Hyperlipemia    Hypersomnia with sleep apnea     Hypertension    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HCC)    Lumbago    Neuropathy    Osteoarthrosis    Osteoporosis    Ovarian failure    Personal history of radiation therapy    Pneumonia due to COVID-19 virus 12/21/2021   a.) required inpatient admission   SOB (shortness of breath)     Family History: Family History  Problem Relation Age of Onset   Breast cancer Maternal Grandmother 5   Colon cancer Mother    Lung cancer Father     Social History   Socioeconomic History   Marital status: Married    Spouse name: Not on file   Number of children: Not on file   Years of education: Not on file   Highest education level: Not on file  Occupational History   Not on file  Tobacco Use   Smoking status: Former    Current packs/day: 0.00    Average packs/day: 1 pack/day for 30.0 years (30.0 ttl pk-yrs)    Types: Cigarettes    Start date: 04/10/1965    Quit date: 04/11/1995    Years since quitting: 28.0   Smokeless tobacco: Never  Vaping Use   Vaping status: Never Used  Substance and Sexual Activity   Alcohol use: No    Alcohol/week: 0.0 standard drinks of alcohol   Drug use: No   Sexual activity: Not on file  Other Topics Concern   Not on file  Social History Narrative   Not on file   Social Drivers of Health   Financial Resource Strain: Low Risk  (10/03/2020)   Overall Financial Resource Strain (CARDIA)    Difficulty of Paying Living Expenses: Not very hard  Food Insecurity: No Food Insecurity (02/24/2023)   Hunger Vital Sign    Worried About Running Out of Food in the Last Year: Never true    Ran Out of Food in the Last Year: Never true  Transportation Needs: No Transportation Needs (02/24/2023)   PRAPARE - Administrator, Civil Service (Medical): No    Lack of Transportation (Non-Medical): No  Physical Activity: Not on file  Stress: Not on file  Social Connections: Not on file  Intimate Partner Violence: Not At Risk (02/24/2023)   Humiliation,  Afraid, Rape, and Kick questionnaire    Fear of Current or Ex-Partner: No    Emotionally Abused: No    Physically Abused: No    Sexually Abused: No      Review of Systems  Constitutional:  Positive for fatigue. Negative for chills and unexpected weight change.  HENT:  Negative for congestion, rhinorrhea, sneezing and sore throat.   Eyes:  Negative for redness.  Respiratory: Negative.  Negative for cough, chest tightness, shortness of breath and wheezing.   Cardiovascular: Negative.  Negative for chest pain and palpitations.  Gastrointestinal: Negative.  Negative for abdominal pain, constipation, diarrhea, nausea and vomiting.  Genitourinary:  Negative for dysuria and frequency.  Musculoskeletal:  Positive for arthralgias and gait problem (using cane). Negative for back pain, joint swelling and neck pain.  Skin:  Negative for rash.  Neurological:  Negative for tremors and numbness.  Hematological:  Negative for adenopathy. Does not bruise/bleed easily.  Psychiatric/Behavioral:  Negative for behavioral problems (Depression), sleep disturbance and suicidal ideas. The patient is not nervous/anxious.     Vital Signs: BP 135/68 Comment: 173/70  Temp 98.4 F (36.9 C)   Resp 16   Ht 5' (1.524 m)   Wt 182 lb 9.6 oz (82.8 kg)   BMI 35.66 kg/m    Physical Exam Vitals reviewed.  Constitutional:      General: She is not in acute distress.    Appearance: Normal appearance. She is obese. She is not ill-appearing.  HENT:     Head: Normocephalic and atraumatic.  Eyes:     Pupils: Pupils are equal, round, and reactive to light.  Cardiovascular:     Rate and Rhythm: Normal rate and regular rhythm.  Pulmonary:     Effort: Pulmonary effort is normal. No respiratory distress.  Neurological:     Mental Status: She is alert and oriented to person, place, and time.  Psychiatric:        Mood and Affect: Mood normal.        Behavior: Behavior normal.  Assessment/Plan: 1. Type 2  diabetes mellitus with chronic kidney disease, with long-term current use of insulin, unspecified CKD stage (HCC) (Primary) A1c is improving, continue medications as prescribed. Changing insurances, may need to adjust or change some of her prescriptions, she will call the clinic if this is the case.  - POCT glycosylated hemoglobin (Hb A1C)  2. Hypertension associated with type 2 diabetes mellitus Continue furosemide as prescribed   3. Diabetic polyneuropathy associated with type 2 diabetes mellitus (HCC) Continue lyrica as prescribed.   4. Screening for colorectal cancer Cologuard test ordered  - Cologuard    General Counseling: Dim verbalizes understanding of the findings of todays visit and agrees with plan of treatment. I have discussed any further diagnostic evaluation that may be needed or ordered today. We also reviewed her medications today. she has been encouraged to call the office with any questions or concerns that should arise related to todays visit.    Orders Placed This Encounter  Procedures   Cologuard   POCT glycosylated hemoglobin (Hb A1C)    No orders of the defined types were placed in this encounter.   Return in about 4 months (around 08/06/2023) for F/U, Recheck A1C, Mckynna Vanloan PCP.   Total time spent:30 Minutes Time spent includes review of chart, medications, test results, and follow up plan with the patient.   [place] Controlled Substance Database was reviewed by me.  This patient was seen by [name], FNP-C in collaboration with Dr. Beverely Risen as a part of collaborative care agreement.   Ivann Trimarco R. Tedd Sias, MSN, FNP-C Internal medicine

## 2023-04-08 ENCOUNTER — Telehealth: Payer: Self-pay | Admitting: Nurse Practitioner

## 2023-04-08 DIAGNOSIS — E1122 Type 2 diabetes mellitus with diabetic chronic kidney disease: Secondary | ICD-10-CM | POA: Diagnosis not present

## 2023-04-08 DIAGNOSIS — Z4781 Encounter for orthopedic aftercare following surgical amputation: Secondary | ICD-10-CM | POA: Diagnosis not present

## 2023-04-08 DIAGNOSIS — I5032 Chronic diastolic (congestive) heart failure: Secondary | ICD-10-CM | POA: Diagnosis not present

## 2023-04-08 DIAGNOSIS — K632 Fistula of intestine: Secondary | ICD-10-CM | POA: Diagnosis not present

## 2023-04-08 DIAGNOSIS — Z89421 Acquired absence of other right toe(s): Secondary | ICD-10-CM | POA: Diagnosis not present

## 2023-04-08 DIAGNOSIS — M86171 Other acute osteomyelitis, right ankle and foot: Secondary | ICD-10-CM | POA: Diagnosis not present

## 2023-04-08 DIAGNOSIS — E1169 Type 2 diabetes mellitus with other specified complication: Secondary | ICD-10-CM | POA: Diagnosis not present

## 2023-04-08 DIAGNOSIS — A419 Sepsis, unspecified organism: Secondary | ICD-10-CM | POA: Diagnosis not present

## 2023-04-08 DIAGNOSIS — I110 Hypertensive heart disease with heart failure: Secondary | ICD-10-CM | POA: Diagnosis not present

## 2023-04-08 NOTE — Telephone Encounter (Signed)
[facility] Provider Services for patient's son, Costella Atondo, completed. Faxed to [employer]; [phone]. Scanned. Mailed to patient-Toni

## 2023-04-09 ENCOUNTER — Ambulatory Visit (INDEPENDENT_AMBULATORY_CARE_PROVIDER_SITE_OTHER): Payer: Medicare HMO | Admitting: Podiatry

## 2023-04-09 DIAGNOSIS — M2041 Other hammer toe(s) (acquired), right foot: Secondary | ICD-10-CM

## 2023-04-13 ENCOUNTER — Encounter: Payer: Self-pay | Admitting: Podiatry

## 2023-04-13 NOTE — Progress Notes (Signed)
Patient presents today for hammertoe tractors on her right foot.  Her amputation site is well-healed and there are no open areas that are risk for infection.  We discussed the risk and benefits of the procedure.  We discussed the recovery process postoperative care.  We discussed that no guarantees can be made and the goal is to not get the toes perfectly straight but to improve their position to reduce pain and improve function.  Following consent the digits were anesthetized with lidocaine and Marcaine.  Following skin prep with Betadine a tourniquet was secured around the base of the third toe.  A #11 blade was used to create a percutaneous flexor tenotomy of the long flexor tendon at the level of the middle phalanx.  Once this was completed sterile bandage with Silvadene nonstick gauze gauze and Coban compression wrap was applied.  Separate but identical procedures were then performed on the fourth and fifth toes.  Post care instructions were given.  She will follow-up in 4 weeks to reevaluate.  We will consider tenotomy on the left foot hammertoes as needed pending her success here.  Of note she also mentions swelling that is chronic in her ankles with some pain.  Suspect arthritis.  We will take radiographs at her next visit to reevaluate.

## 2023-04-21 DIAGNOSIS — Z1211 Encounter for screening for malignant neoplasm of colon: Secondary | ICD-10-CM | POA: Diagnosis not present

## 2023-04-21 DIAGNOSIS — Z1212 Encounter for screening for malignant neoplasm of rectum: Secondary | ICD-10-CM | POA: Diagnosis not present

## 2023-04-28 LAB — COLOGUARD: COLOGUARD: POSITIVE — AB

## 2023-05-05 ENCOUNTER — Other Ambulatory Visit: Payer: Self-pay

## 2023-05-05 ENCOUNTER — Telehealth: Payer: Self-pay

## 2023-05-05 DIAGNOSIS — E1142 Type 2 diabetes mellitus with diabetic polyneuropathy: Secondary | ICD-10-CM

## 2023-05-05 DIAGNOSIS — G834 Cauda equina syndrome: Secondary | ICD-10-CM

## 2023-05-05 DIAGNOSIS — M4800 Spinal stenosis, site unspecified: Secondary | ICD-10-CM

## 2023-05-05 DIAGNOSIS — I700 Atherosclerosis of aorta: Secondary | ICD-10-CM

## 2023-05-05 MED ORDER — PREGABALIN 100 MG PO CAPS: 100.0000 mg | ORAL_CAPSULE | Freq: Two times a day (BID) | ORAL | 0 refills | Status: DC

## 2023-05-05 MED ORDER — ROSUVASTATIN CALCIUM 20 MG PO TABS: 20.0000 mg | ORAL_TABLET | Freq: Every day | ORAL | 3 refills | Status: DC

## 2023-05-05 MED ORDER — PREGABALIN 100 MG PO CAPS: 100.0000 mg | ORAL_CAPSULE | Freq: Two times a day (BID) | ORAL | 2 refills | Status: DC

## 2023-05-05 NOTE — Telephone Encounter (Signed)
 Refills  of lyrica ordered

## 2023-05-07 ENCOUNTER — Ambulatory Visit: Payer: Medicare Other | Admitting: Podiatry

## 2023-05-07 ENCOUNTER — Ambulatory Visit (INDEPENDENT_AMBULATORY_CARE_PROVIDER_SITE_OTHER): Payer: Medicare Other

## 2023-05-07 ENCOUNTER — Encounter: Payer: Self-pay | Admitting: Podiatry

## 2023-05-07 VITALS — Ht 60.0 in | Wt 182.6 lb

## 2023-05-07 DIAGNOSIS — M25571 Pain in right ankle and joints of right foot: Secondary | ICD-10-CM

## 2023-05-07 DIAGNOSIS — M79674 Pain in right toe(s): Secondary | ICD-10-CM | POA: Diagnosis not present

## 2023-05-07 DIAGNOSIS — M19071 Primary osteoarthritis, right ankle and foot: Secondary | ICD-10-CM

## 2023-05-07 DIAGNOSIS — B351 Tinea unguium: Secondary | ICD-10-CM | POA: Diagnosis not present

## 2023-05-07 DIAGNOSIS — M2041 Other hammer toe(s) (acquired), right foot: Secondary | ICD-10-CM

## 2023-05-07 DIAGNOSIS — M25572 Pain in left ankle and joints of left foot: Secondary | ICD-10-CM | POA: Diagnosis not present

## 2023-05-07 DIAGNOSIS — M79675 Pain in left toe(s): Secondary | ICD-10-CM

## 2023-05-07 DIAGNOSIS — M19072 Primary osteoarthritis, left ankle and foot: Secondary | ICD-10-CM

## 2023-05-07 NOTE — Progress Notes (Signed)
  Subjective:  Patient ID: Kara [name], female    DOB: [birth year],  MRN: [id number]  Chief Complaint  Patient presents with   Ankle Pain    Pt is here due to bilateral ankle pain, states her ankles hurts her all the time and usually are swollen, not as swollen today as normal, X-Rays completed.     78 y.o. female returns for post-op check.  Doing well not having any pain.  The ankles are painful and swelling chronically for her  Review of Systems: Negative except as noted in the HPI. Denies N/V/F/Ch.   Objective:  There were no vitals filed for this visit. Body mass index is 35.66 kg/m. Constitutional Well developed. Well nourished.  Vascular Foot warm and well perfused. Capillary refill normal to all digits.  Calf is soft and supple, no posterior calf or knee pain, negative Homans' sign  Neurologic Normal speech. Oriented to person, place, and time. Epicritic sensation to light touch grossly reduced bilaterally.  Dermatologic Incisions from flexor tenotomy's are healing well  Orthopedic: She has no pain to palpation noted about the surgical site.  She does have tenderness and swelling around the bilateral ankle   Culture with staph epi, path margins clean from osteomyelitis  Radiographs of both ankles taken today show pes planovalgus deformity on the right with collapse of the medial arch, but ankle mortise is well aligned with mild degenerative changes noted in the anterior posterior portions of the ankle and subtalar joint, left foot has better alignment with mild degenerative changes in the ankle and subtalar joint Assessment:   1. Arthritis of both ankles   2. Hammertoe of right foot   3. Pain due to onychomycosis of toenails of both feet    Plan:  Patient was evaluated and treated and all questions answered.  S/p foot surgery right -Flexor tenotomy's are healing well may leave open to air  Discussed the etiology and treatment options for the condition in detail  with the patient. Recommended debridement of the nails today. Sharp and mechanical debridement performed of all painful and mycotic nails today. Nails debrided in length and thickness using a nail nipper to level of comfort.  She will resume regular care with Dr. Denece Finger for this  Regarding her ankle pain and swelling she does have ankle arthritis and subtalar arthritis noted on both feet, we discussed treatment of this including use of medication such as arthritis Tylenol  strength when it is painful and she will trial this.  We discussed corticosteroid injection could offer symptomatic relief as well and she will let me know if this is necessary.  Long-term bracing and/or surgical options may exist as well if worsening she will follow-up as needed and utilize Tylenol  for now.   Return in about 9 weeks (around 07/09/2023) for at risk diabetic foot care.

## 2023-05-14 ENCOUNTER — Other Ambulatory Visit: Payer: Self-pay | Admitting: Nurse Practitioner

## 2023-05-14 ENCOUNTER — Telehealth: Payer: Self-pay

## 2023-05-14 DIAGNOSIS — R195 Other fecal abnormalities: Secondary | ICD-10-CM

## 2023-05-14 NOTE — Progress Notes (Signed)
Please let patient know that her cologuard test was positive and the next step is a follow up colonoscopy. I have referred her to gastroenterology so they she be calling her to get this set up.

## 2023-05-14 NOTE — Telephone Encounter (Signed)
-----   Message from [place] sent at 05/14/2023  1:03 PM EST ----- Please let patient know that her cologuard test was positive and the next step is a follow up colonoscopy. I have referred her to gastroenterology so they she be calling her to get this set up.

## 2023-05-14 NOTE — Telephone Encounter (Signed)
Patient notified

## 2023-05-27 ENCOUNTER — Other Ambulatory Visit: Payer: Self-pay

## 2023-05-27 DIAGNOSIS — E1122 Type 2 diabetes mellitus with diabetic chronic kidney disease: Secondary | ICD-10-CM

## 2023-05-27 DIAGNOSIS — H34832 Tributary (branch) retinal vein occlusion, left eye, with macular edema: Secondary | ICD-10-CM | POA: Diagnosis not present

## 2023-05-27 MED ORDER — INSULIN GLARGINE 100 UNIT/ML SOLOSTAR PEN: 40.0000 [IU] | PEN_INJECTOR | Freq: Every day | SUBCUTANEOUS | 11 refills | Status: DC

## 2023-06-02 ENCOUNTER — Telehealth: Payer: Self-pay

## 2023-06-02 DIAGNOSIS — E1142 Type 2 diabetes mellitus with diabetic polyneuropathy: Secondary | ICD-10-CM

## 2023-06-02 DIAGNOSIS — G834 Cauda equina syndrome: Secondary | ICD-10-CM

## 2023-06-02 DIAGNOSIS — M4800 Spinal stenosis, site unspecified: Secondary | ICD-10-CM

## 2023-06-04 MED ORDER — PREGABALIN 100 MG PO CAPS: 100.0000 mg | ORAL_CAPSULE | Freq: Two times a day (BID) | ORAL | 1 refills | Status: DC

## 2023-06-04 NOTE — Telephone Encounter (Signed)
Pt advised we sent med

## 2023-06-05 ENCOUNTER — Ambulatory Visit: Payer: Medicare HMO | Admitting: Gastroenterology

## 2023-06-05 ENCOUNTER — Other Ambulatory Visit: Payer: Self-pay

## 2023-06-05 DIAGNOSIS — R195 Other fecal abnormalities: Secondary | ICD-10-CM

## 2023-06-12 ENCOUNTER — Other Ambulatory Visit: Payer: Self-pay

## 2023-06-12 ENCOUNTER — Telehealth: Payer: Self-pay

## 2023-06-12 MED ORDER — NA SULFATE-K SULFATE-MG SULF 17.5-3.13-1.6 GM/177ML PO SOLN: 354.0000 mL | Freq: Once | ORAL | 0 refills | Status: DC

## 2023-06-12 MED ORDER — NA SULFATE-K SULFATE-MG SULF 17.5-3.13-1.6 GM/177ML PO SOLN: 354.0000 mL | Freq: Once | ORAL | 0 refills | Status: AC

## 2023-06-12 NOTE — Telephone Encounter (Signed)
The patient called in about one of her medication.

## 2023-06-12 NOTE — Telephone Encounter (Signed)
Patient called stating that she had some questions about her procedure and I was able to answer them for her. She also stated that she had not received a call from her pharmacy about her Suprep. Therefore, I told her that I would re-send it and that they should reach out to her.  Patient also stated that she had a new insurance. I told her that I would mention it to the front desk and so to expect a call from them to obtain her new insurance. Carver Fila, can you please call patient to update her insurance. Thank you.

## 2023-06-13 NOTE — Telephone Encounter (Signed)
Called patient back and asked how I could help her and she stated that she had received a call back from someone from our office in reference to her insurance and procedure. Patient had no further questions.

## 2023-06-16 ENCOUNTER — Telehealth: Payer: Self-pay | Admitting: *Deleted

## 2023-06-16 DIAGNOSIS — D0511 Intraductal carcinoma in situ of right breast: Secondary | ICD-10-CM

## 2023-06-16 MED ORDER — TAMOXIFEN CITRATE 20 MG PO TABS: 20.0000 mg | ORAL_TABLET | Freq: Every day | ORAL | 3 refills | Status: DC

## 2023-06-16 NOTE — Telephone Encounter (Signed)
 Kara [name], patient stated that nobody had called her in reference to her insurance being updated. Can you please give her a call when you have a chance. Thank you.

## 2023-06-16 NOTE — Telephone Encounter (Signed)
 Pt. When I called her back and told her that she will have the tamoxifen and she says that she now changed to [facility] in [place] . I will call and cancel the the tamxoifen at [facility]  and then put it in for [facility] [place]

## 2023-06-16 NOTE — Telephone Encounter (Signed)
 I called the patient this morning she said that someone has already called her I am calling her back. The patient stated that she spoke to someone in the hospital. The patient and I still went over her insurance information.

## 2023-06-18 ENCOUNTER — Ambulatory Visit: Payer: Medicare Other | Admitting: Dermatology

## 2023-06-18 DIAGNOSIS — D229 Melanocytic nevi, unspecified: Secondary | ICD-10-CM

## 2023-06-18 DIAGNOSIS — L820 Inflamed seborrheic keratosis: Secondary | ICD-10-CM

## 2023-06-18 DIAGNOSIS — D2239 Melanocytic nevi of other parts of face: Secondary | ICD-10-CM | POA: Diagnosis not present

## 2023-06-18 DIAGNOSIS — L120 Bullous pemphigoid: Secondary | ICD-10-CM | POA: Diagnosis not present

## 2023-06-18 NOTE — Progress Notes (Signed)
   Follow-Up Visit   Subjective  Kara [name] is a 78 y.o. female who presents for the following: Spot on the right cheek, present x 3 months. Patient scratches off and if comes back.   The patient has spots, moles and lesions to be evaluated, some may be new or changing and the patient may have concern these could be cancer.   The following portions of the chart were reviewed this encounter and updated as appropriate: medications, allergies, medical history  Review of Systems:  No other skin or systemic complaints except as noted in HPI or Assessment and Plan.  Objective  Well appearing patient in no apparent distress; mood and affect are within normal limits.  A focused examination was performed of the following areas: Face  Relevant physical exam findings are noted in the Assessment and Plan.  R mid jaw x 1, R infraocular x 1 (2) Erythematous stuck-on, waxy papule arms, legs Crusted pink papules, healing, R ear, R eyebrow, frontal hairline, pt states had some on legs which have cleared up  Assessment & Plan  INFLAMED SEBORRHEIC KERATOSIS (2) R mid jaw x 1, R infraocular x 1 (2) Symptomatic, irritating, patient would like treated. Destruction of lesion - R mid jaw x 1, R infraocular x 1 (2)  Destruction method: cryotherapy   Informed consent: discussed and consent obtained   Lesion destroyed using liquid nitrogen: Yes   Region frozen until ice ball extended beyond lesion: Yes   Outcome: patient tolerated procedure well with no complications   Post-procedure details: wound care instructions given   Additional details:  Prior to procedure, discussed risks of blister formation, small wound, skin dyspigmentation, or rare scar following cryotherapy. Recommend Vaseline ointment to treated areas while healing.  BULLOUS PEMPHIGOID arms, legs Chronic condition with duration or expected duration over one year. Currently well-controlled.   Continue doxycycine 100 MG take 1 po BID  with food. Last Rx sent in by [name], NP. Patient will call for refills to be sent to [facility]. Doxycycline should be taken with food to prevent nausea. Do not lay down for 30 minutes after taking. Be cautious with sun exposure and use good sun protection while on this medication. Pregnant women should not take this medication.   Continue clobetasol ointment to affected areas twice daily until clear prn flares. Avoid applying to face, groin, and axilla. Use as directed. Long-term use can cause thinning of the skin.    MELANOCYTIC NEVI Exam: Tan-brown and/or pink-flesh-colored symmetric macules and papules, including R cheek  Treatment Plan: Benign appearing on exam today. Recommend observation. Call clinic for new or changing moles. Recommend daily use of broad spectrum spf 30+ sunscreen to sun-exposed areas.    Return if symptoms worsen or fail to improve. Patient will call back to schedule follow-up.Wendee Beavers, CMA, am acting as scribe for Willeen Niece, MD .   Documentation: I have reviewed the above documentation for accuracy and completeness, and I agree with the above.  Willeen Niece, MD

## 2023-06-18 NOTE — Patient Instructions (Addendum)

## 2023-06-23 ENCOUNTER — Ambulatory Visit: Payer: Medicare HMO | Admitting: Orthopedic Surgery

## 2023-06-24 ENCOUNTER — Other Ambulatory Visit: Payer: Self-pay | Admitting: Nurse Practitioner

## 2023-06-24 DIAGNOSIS — E1122 Type 2 diabetes mellitus with diabetic chronic kidney disease: Secondary | ICD-10-CM

## 2023-06-25 ENCOUNTER — Encounter: Payer: Self-pay | Admitting: Gastroenterology

## 2023-06-25 ENCOUNTER — Ambulatory Visit
Admission: RE | Admit: 2023-06-25 | Discharge: 2023-06-25 | Disposition: A | Discharge status: Home / Self Care | Payer: Medicare Other | Attending: Gastroenterology | Admitting: Gastroenterology

## 2023-06-25 ENCOUNTER — Encounter
Admission: RE | Disposition: A | Discharge status: Home / Self Care | Payer: Self-pay | Source: Home / Self Care | Attending: Gastroenterology

## 2023-06-25 ENCOUNTER — Ambulatory Visit: Admitting: Anesthesiology

## 2023-06-25 DIAGNOSIS — R195 Other fecal abnormalities: Secondary | ICD-10-CM | POA: Diagnosis not present

## 2023-06-25 DIAGNOSIS — Z1211 Encounter for screening for malignant neoplasm of colon: Secondary | ICD-10-CM | POA: Diagnosis not present

## 2023-06-25 DIAGNOSIS — D120 Benign neoplasm of cecum: Secondary | ICD-10-CM | POA: Diagnosis not present

## 2023-06-25 DIAGNOSIS — K5730 Diverticulosis of large intestine without perforation or abscess without bleeding: Secondary | ICD-10-CM | POA: Diagnosis not present

## 2023-06-25 DIAGNOSIS — Z87891 Personal history of nicotine dependence: Secondary | ICD-10-CM | POA: Insufficient documentation

## 2023-06-25 DIAGNOSIS — G4730 Sleep apnea, unspecified: Secondary | ICD-10-CM | POA: Insufficient documentation

## 2023-06-25 DIAGNOSIS — Z794 Long term (current) use of insulin: Secondary | ICD-10-CM | POA: Insufficient documentation

## 2023-06-25 DIAGNOSIS — D122 Benign neoplasm of ascending colon: Secondary | ICD-10-CM | POA: Diagnosis not present

## 2023-06-25 DIAGNOSIS — N184 Chronic kidney disease, stage 4 (severe): Secondary | ICD-10-CM | POA: Diagnosis not present

## 2023-06-25 DIAGNOSIS — Z7985 Long-term (current) use of injectable non-insulin antidiabetic drugs: Secondary | ICD-10-CM | POA: Insufficient documentation

## 2023-06-25 DIAGNOSIS — I129 Hypertensive chronic kidney disease with stage 1 through stage 4 chronic kidney disease, or unspecified chronic kidney disease: Secondary | ICD-10-CM | POA: Insufficient documentation

## 2023-06-25 DIAGNOSIS — D125 Benign neoplasm of sigmoid colon: Secondary | ICD-10-CM | POA: Diagnosis not present

## 2023-06-25 DIAGNOSIS — E1122 Type 2 diabetes mellitus with diabetic chronic kidney disease: Secondary | ICD-10-CM | POA: Insufficient documentation

## 2023-06-25 DIAGNOSIS — K635 Polyp of colon: Secondary | ICD-10-CM | POA: Diagnosis not present

## 2023-06-25 DIAGNOSIS — D126 Benign neoplasm of colon, unspecified: Secondary | ICD-10-CM

## 2023-06-25 DIAGNOSIS — E782 Mixed hyperlipidemia: Secondary | ICD-10-CM | POA: Diagnosis not present

## 2023-06-25 HISTORY — PX: HEMOSTASIS CLIP PLACEMENT: SHX6857

## 2023-06-25 HISTORY — PX: POLYPECTOMY: SHX5525

## 2023-06-25 HISTORY — PX: COLONOSCOPY WITH PROPOFOL: SHX5780

## 2023-06-25 LAB — GLUCOSE, CAPILLARY: Glucose-Capillary: 87 mg/dL (ref 70–99)

## 2023-06-25 SURGERY — COLONOSCOPY WITH PROPOFOL
Anesthesia: General

## 2023-06-25 MED ORDER — LACTATED RINGERS IV SOLN: INTRAVENOUS | Status: DC

## 2023-06-25 MED ORDER — SODIUM CHLORIDE 0.9 % IV SOLN: INTRAVENOUS | Status: DC

## 2023-06-25 MED ORDER — PROPOFOL 500 MG/50ML IV EMUL
INTRAVENOUS | Status: DC | PRN
  Administered 2023-06-25: 70 mg via INTRAVENOUS
  Administered 2023-06-25: 150 ug/kg/min via INTRAVENOUS

## 2023-06-25 NOTE — H&P (Signed)
 Wyline Mood, MD [address], [address], [place], [place], [address] [address], [place], [place], [address] Phone: [phone]  Fax: [phone]  Primary Care Physician:  [name], NP   Pre-Procedure History & Physical: HPI:  Kara [name] is a 78 y.o. female is here for an colonoscopy.   Past Medical History:  Diagnosis Date   Anemia    Arthropathy    Benign neoplasm of breast    Breast cancer (HCC)    DCIS   Chest pain    CKD (chronic kidney disease), stage IV (HCC)    Diabetes (HCC)    Diabetic retinopathy (HCC)    Dyspnea    Edema    History of kidney stones    Hyperlipemia    Hypersomnia with sleep apnea    Hypertension    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HCC)    Lumbago    Neuropathy    Osteoarthrosis    Osteoporosis    Ovarian failure    Personal history of radiation therapy    Pneumonia due to COVID-19 virus 12/21/2021   a.) required inpatient admission   SOB (shortness of breath)     Past Surgical History:  Procedure Laterality Date   AMPUTATION TOE Right 02/26/2023   Procedure: SECOND DIGIT AMPUTATION;  Surgeon: Edwin Cap, DPM;  Location: [facility];  Service: Orthopedics/Podiatry;  Laterality: Right;   ANTERIOR LATERAL LUMBAR FUSION WITH PERCUTANEOUS SCREW 2 LEVEL N/A 07/03/2022   Procedure: L3-5 LATERAL LUMBAR INTERBODY FUSION WITH POSTERIOR SPINAL FUSION;  Surgeon: Venetia Night, MD;  Location: [facility];  Service: Neurosurgery;  Laterality: N/A;   APPLICATION OF INTRAOPERATIVE CT SCAN N/A 07/03/2022   Procedure: APPLICATION OF INTRAOPERATIVE CT SCAN;  Surgeon: Venetia Night, MD;  Location: [facility];  Service: Neurosurgery;  Laterality: N/A;   BILATERAL SALPINGECTOMY     BREAST BIOPSY Right 2014   neg- core   BREAST BIOPSY Right 12/21/2018   [place] bx, venus clip,  DUCTAL CARCINOMA IN SITU   BREAST BIOPSY Right 12/21/2018   [place] bx, ribbon clip,  FIBROEPITHELIAL PROLIFERATION WITH SCLEROSIS   BREAST BIOPSY  Right 01/01/2019   Affirm bx "X" clip-path pending   BREAST DUCTAL SYSTEM EXCISION     BREAST EXCISIONAL BIOPSY Right 2014   neg   BREAST LUMPECTOMY Right 01/22/2019   path pending   CATARACT EXTRACTION     CHOLECYSTECTOMY     CYSTOSCOPY WITH STENT PLACEMENT Left 04/14/2015   Procedure: CYSTOSCOPY WITH STENT PLACEMENT;  Surgeon: Vanna Scotland, MD;  Location: [facility];  Service: Urology;  Laterality: Left;   EXTRACORPOREAL SHOCK WAVE LITHOTRIPSY Left 09/29/2014   Procedure: EXTRACORPOREAL SHOCK WAVE LITHOTRIPSY (ESWL);  Surgeon: Vanna Scotland, MD;  Location: [facility];  Service: Urology;  Laterality: Left;   EYE SURGERY     bilateral cataract   IRRIGATION AND DEBRIDEMENT ABSCESS Right 03/24/2019   Procedure: IRRIGATION AND DEBRIDEMENT ABSCESS RIGHT BREAST;  Surgeon: [name], MD;  Location: [facility];  Service: General;  Laterality: Right;   LAPAROSCOPIC OOPHERECTOMY Left    PARTIAL MASTECTOMY WITH NEEDLE LOCALIZATION Right 01/22/2019   Procedure: PARTIAL MASTECTOMY WITH NEEDLE LOCALIZATION;  Surgeon: [name], MD;  Location: [facility];  Service: General;  Laterality: Right;   TONSILLECTOMY     TUBAL LIGATION     URETEROSCOPY WITH HOLMIUM LASER LITHOTRIPSY Left 04/14/2015   Procedure: URETEROSCOPY WITH HOLMIUM LASER LITHOTRIPSY;  Surgeon: Vanna Scotland, MD;  Location: [facility];  Service: Urology;  Laterality: Left;  Prior to Admission medications   Medication Sig Start Date End Date Taking? Authorizing Provider  acetaminophen (TYLENOL) 325 MG tablet Take 2 tablets (650 mg total) by mouth 3 (three) times daily. 02/28/23   Gillis Santa, MD  Aflibercept Gretta Cool) 2 MG/0.05ML SOLN     [provider]  Calcium-Magnesium-Vitamin D (CALCIUM 1200+D3 PO) Take 1 tablet by mouth daily.    [provider]  Collagen-Boron-Hyaluronic Acid (MOVE FREE ULTRA JOINT HEALTH) 40-5-3.3 MG TABS Take 1 tablet by mouth daily.    [provider]  doxycycline  (MONODOX) 100 MG capsule  03/03/23   [provider]  Dulaglutide (TRULICITY) 1.5 MG/0.5ML SOAJ Inject 1.5 mg into the skin once a week. 03/10/23   [name], NP  Ferrous Gluconate 324 (37.5 Fe) MG TABS Take 324 mg by mouth daily.    [provider]  furosemide (LASIX) 20 MG tablet Take 2 tablets by mouth in the AM and take 1 tablet by mouth in the PM. May add 1 tablet to afternoon dose if swelling not improved. 11/26/22   [name], NP  gentamicin cream (GARAMYCIN) 0.1 % Apply to ulcers once daily. 12/09/22   Freddie Breech, DPM  hydrocortisone 2.5 % cream APPLY TO AFFECTED AREAS BUTTOCKS ONCE TO TWICE DAILY UNTIL IMPROVED. 02/17/23   Willeen Niece, MD  insulin aspart (NOVOLOG FLEXPEN) 100 UNIT/ML FlexPen INJECT UNDER THE SKIN TWICE DAILY AS DIRECTED PER SLIDING SCALE. MAX DAILY DOSE IS 30 UNITS. 01/10/22   Lyndon Code, MD  insulin aspart (NOVOLOG FLEXPEN) 100 UNIT/ML FlexPen INJECT Cobb 3 times daily with meals AS DIRECTED PER SLIDING SCALE. MAX DAILY DOSE IS 30 UNITS. DX E11.65. 02/18/23   [name], NP  insulin glargine (LANTUS) 100 UNIT/ML Solostar Pen Inject 40 Units into the skin daily. 05/27/23   [name], NP  ketoconazole (NIZORAL) 2 % cream Apply to affected area buttocks twice daily until improved. 11/06/22   Willeen Niece, MD  Boris Lown Oil 500 MG CAPS Take 1 capsule by mouth daily.    [provider]  Lactobacillus (PROBIOTIC ACIDOPHILUS) CAPS Take 1 capsule by mouth daily.    [provider]  Multiple Vitamin (MULTIVITAMIN) tablet Take 1 tablet by mouth daily at 12 noon.    [provider]  pantoprazole (PROTONIX) 40 MG tablet TAKE 1 TABLET BY MOUTH EVERY DAY 11/15/22   [name], NP  pregabalin (LYRICA) 100 MG capsule Take 1 capsule (100 mg total) by mouth 2 (two) times daily. 06/04/23   [name], NP  rosuvastatin (CRESTOR) 20 MG tablet Take 1 tablet (20 mg total) by mouth daily. TAKE 1 TABLET BY  MOUTH EVERYDAY AT BEDTIME 05/05/23   [name], NP  tamoxifen (NOLVADEX) 20 MG tablet Take 1 tablet (20 mg total) by mouth daily. 06/16/23   [name], MD  triamcinolone (KENALOG) 0.025 % cream Apply 1 Application topically 2 (two) times daily as needed (itching). To groin area 12/19/22   [name], NP    Allergies as of 06/05/2023 - Review Complete 05/07/2023  Allergen Reaction Noted   Amoxicillin Nausea And Vomiting 12/21/2021    Family History  Problem Relation Age of Onset   Breast cancer Maternal Grandmother 51   Colon cancer Mother    Lung cancer Father     Social History   Socioeconomic History   Marital status: Married    Spouse name: Not on file   Number of children: Not on file   Years of education: Not  on file   Highest education level: Not on file  Occupational History   Not on file  Tobacco Use   Smoking status: Former    Current packs/day: 0.00    Average packs/day: 1 pack/day for 30.0 years (30.0 ttl pk-yrs)    Types: Cigarettes    Start date: 04/10/1965    Quit date: 04/11/1995    Years since quitting: 28.2   Smokeless tobacco: Never  Vaping Use   Vaping status: Never Used  Substance and Sexual Activity   Alcohol use: No    Alcohol/week: 0.0 standard drinks of alcohol   Drug use: No   Sexual activity: Not on file  Other Topics Concern   Not on file  Social History Narrative   Not on file   Social Drivers of Health   Financial Resource Strain: Low Risk  (10/03/2020)   Overall Financial Resource Strain (CARDIA)    Difficulty of Paying Living Expenses: Not very hard  Food Insecurity: No Food Insecurity (02/24/2023)   Hunger Vital Sign    Worried About Running Out of Food in the Last Year: Never true    Ran Out of Food in the Last Year: Never true  Transportation Needs: No Transportation Needs (02/24/2023)   PRAPARE - Administrator, Civil Service (Medical): No    Lack of Transportation (Non-Medical): No  Physical  Activity: Not on file  Stress: Not on file  Social Connections: Not on file  Intimate Partner Violence: Not At Risk (02/24/2023)   Humiliation, Afraid, Rape, and Kick questionnaire    Fear of Current or Ex-Partner: No    Emotionally Abused: No    Physically Abused: No    Sexually Abused: No    Review of Systems: See HPI, otherwise negative ROS  Physical Exam: There were no vitals taken for this visit. General:   Alert,  pleasant and cooperative in NAD Head:  Normocephalic and atraumatic. Neck:  Supple; no masses or thyromegaly. Lungs:  Clear throughout to auscultation, normal respiratory effort.    Heart:  +S1, +S2, Regular rate and rhythm, No edema. Abdomen:  Soft, nontender and nondistended. Normal bowel sounds, without guarding, and without rebound.   Neurologic:  Alert and  oriented x4;  grossly normal neurologically.  Impression/Plan: Norville Haggard is here for an colonoscopy to be performed for Screening colonoscopy positive cologuard.  Risks, benefits, limitations, and alternatives regarding  colonoscopy have been reviewed with the patient.  Questions have been answered.  All parties agreeable.   Wyline Mood, MD  06/25/2023, 11:34 AM

## 2023-06-25 NOTE — Anesthesia Preprocedure Evaluation (Signed)
 Anesthesia Evaluation  Patient identified by MRN, date of birth, ID band Patient awake    Reviewed: Allergy & Precautions, NPO status , Patient's Chart, lab work & pertinent test results  History of Anesthesia Complications Negative for: history of anesthetic complications  Airway Mallampati: III  TM Distance: <3 FB Neck ROM: full    Dental  (+) Chipped   Pulmonary neg pulmonary ROS, neg shortness of breath, former smoker   Pulmonary exam normal        Cardiovascular Exercise Tolerance: Good hypertension, (-) angina +CHF  Normal cardiovascular exam     Neuro/Psych  Neuromuscular disease  negative psych ROS   GI/Hepatic Neg liver ROS,GERD  Controlled,,  Endo/Other  negative endocrine ROSdiabetes, Type 2    Renal/GU Renal disease  negative genitourinary   Musculoskeletal   Abdominal   Peds  Hematology negative hematology ROS (+)   Anesthesia Other Findings Past Medical History: No date: Anemia No date: Arthropathy No date: Benign neoplasm of breast No date: Breast cancer (HCC)     Comment:  DCIS No date: Chest pain No date: CKD (chronic kidney disease), stage IV (HCC) No date: Diabetes (HCC) No date: Diabetic retinopathy (HCC) No date: Dyspnea No date: Edema No date: History of kidney stones No date: Hyperlipemia No date: Hypersomnia with sleep apnea No date: Hypertension No date: 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HCC) No date: Lumbago No date: Neuropathy No date: Osteoarthrosis No date: Osteoporosis No date: Ovarian failure No date: Personal history of radiation therapy 12/21/2021: Pneumonia due to COVID-19 virus     Comment:  a.) required inpatient admission No date: SOB (shortness of breath)  Past Surgical History: 02/26/2023: AMPUTATION TOE; Right     Comment:  Procedure: SECOND DIGIT AMPUTATION;  Surgeon: Edwin Cap, DPM;  Location: [facility];  Service:                Orthopedics/Podiatry;  Laterality: Right; 07/03/2022: ANTERIOR LATERAL LUMBAR FUSION WITH PERCUTANEOUS SCREW 2  LEVEL; N/A     Comment:  Procedure: L3-5 LATERAL LUMBAR INTERBODY FUSION WITH               POSTERIOR SPINAL FUSION;  Surgeon: Venetia Night,               MD;  Location: [facility];  Service: Neurosurgery;                Laterality: N/A; 07/03/2022: APPLICATION OF INTRAOPERATIVE CT SCAN; N/A     Comment:  Procedure: APPLICATION OF INTRAOPERATIVE CT SCAN;                Surgeon: Venetia Night, MD;  Location: [facility];                Service: Neurosurgery;  Laterality: N/A; No date: BILATERAL SALPINGECTOMY 2014: BREAST BIOPSY; Right     Comment:  neg- core 12/21/2018: BREAST BIOPSY; Right     Comment:  [place] bx, venus clip,  DUCTAL CARCINOMA IN SITU 12/21/2018: BREAST BIOPSY; Right     Comment:  [place] bx, ribbon clip,  FIBROEPITHELIAL PROLIFERATION WITH               SCLEROSIS 01/01/2019: BREAST BIOPSY; Right     Comment:  Affirm bx "X" clip-path pending No date: BREAST DUCTAL SYSTEM EXCISION 2014: BREAST EXCISIONAL BIOPSY; Right     Comment:  neg 01/22/2019: BREAST LUMPECTOMY; Right     Comment:  path pending No date: CATARACT EXTRACTION No date: CHOLECYSTECTOMY 04/14/2015: CYSTOSCOPY WITH STENT PLACEMENT; Left     Comment:  Procedure: CYSTOSCOPY WITH STENT PLACEMENT;  Surgeon:               Vanna Scotland, MD;  Location: [facility];  Service:               Urology;  Laterality: Left; 09/29/2014: EXTRACORPOREAL SHOCK WAVE LITHOTRIPSY; Left     Comment:  Procedure: EXTRACORPOREAL SHOCK WAVE LITHOTRIPSY (ESWL);              Surgeon: Vanna Scotland, MD;  Location: [facility];                Service: Urology;  Laterality: Left; No date: EYE SURGERY     Comment:  bilateral cataract 03/24/2019: IRRIGATION AND DEBRIDEMENT ABSCESS; Right     Comment:  Procedure: IRRIGATION AND DEBRIDEMENT ABSCESS RIGHT               BREAST;  Surgeon: [name], MD;  Location:                [facility];  Service: General;  Laterality: Right; No date: LAPAROSCOPIC OOPHERECTOMY; Left 01/22/2019: PARTIAL MASTECTOMY WITH NEEDLE LOCALIZATION; Right     Comment:  Procedure: PARTIAL MASTECTOMY WITH NEEDLE LOCALIZATION;               Surgeon: [name], MD;  Location: [facility];               Service: General;  Laterality: Right; No date: TONSILLECTOMY No date: TUBAL LIGATION 04/14/2015: URETEROSCOPY WITH HOLMIUM LASER LITHOTRIPSY; Left     Comment:  Procedure: URETEROSCOPY WITH HOLMIUM LASER LITHOTRIPSY;               Surgeon: Vanna Scotland, MD;  Location: [facility];                Service: Urology;  Laterality: Left;     Reproductive/Obstetrics negative OB ROS                             Anesthesia Physical Anesthesia Plan  ASA: 3  Anesthesia Plan: General   Post-op Pain Management:    Induction: Intravenous  PONV Risk Score and Plan: Propofol infusion and TIVA  Airway Management Planned: Natural Airway and Nasal Cannula  Additional Equipment:   Intra-op Plan:   Post-operative Plan:   Informed Consent: I have reviewed the patients History and Physical, chart, labs and discussed the procedure including the risks, benefits and alternatives for the proposed anesthesia with the patient or authorized representative who has indicated his/her understanding and acceptance.     Dental Advisory Given  Plan Discussed with: Anesthesiologist, CRNA and Surgeon  Anesthesia Plan Comments: (Patient consented for risks of anesthesia including but not limited to:  - adverse reactions to medications - risk of airway placement if required - damage to eyes, teeth, lips or other oral mucosa - nerve damage due to positioning  - sore throat or hoarseness - Damage to heart, brain, nerves, lungs, other parts of body or loss of life  Patient voiced understanding and assent.)       Anesthesia Quick Evaluation

## 2023-06-25 NOTE — Transfer of Care (Signed)
 Immediate Anesthesia Transfer of Care Note  Patient: Kara [name]  Procedure(s) Performed: COLONOSCOPY WITH PROPOFOL POLYPECTOMY CONTROL OF HEMORRHAGE, GI TRACT, ENDOSCOPIC, BY CLIPPING OR OVERSEWING  Patient Location: PACU  Anesthesia Type:General  Level of Consciousness: awake  Airway & Oxygen Therapy: Patient Spontanous Breathing  Post-op Assessment: Report given to RN and Post -op Vital signs reviewed and stable  Post vital signs: Reviewed and stable  Last Vitals:  Vitals Value Taken Time  BP 106/54 06/25/23 1407  Temp 35.9 C 06/25/23 1405  Pulse 63 06/25/23 1407  Resp 21 06/25/23 1407  SpO2 100 % 06/25/23 1407  Vitals shown include unfiled device data.  Last Pain:  Vitals:   06/25/23 1405  TempSrc: Temporal  PainSc: Asleep         Complications: There were no known notable events for this encounter.

## 2023-06-25 NOTE — Op Note (Signed)
 [facility] Patient Name: Kara [name] Procedure Date: 06/25/2023 12:54 PM MRN: [id number] Account #: [id number] Date of Birth: [birth year] Admit Type: Outpatient Age: 78 Room: [facility] ENDO ROOM 3 Gender: Female Note Status: Finalized Instrument Name: Prentice Docker [id number] Procedure:             Colonoscopy Indications:           Screening for colorectal malignant neoplasm due to                         positive Cologuard test Providers:             Wyline Mood MD, MD Referring MD:          [name] (Referring MD) Medicines:             Monitored Anesthesia Care Complications:         No immediate complications. Procedure:             Pre-Anesthesia Assessment:                        - Prior to the procedure, a History and Physical was                         performed, and patient medications, allergies and                         sensitivities were reviewed. The patient's tolerance                         of previous anesthesia was reviewed.                        - The risks and benefits of the procedure and the                         sedation options and risks were discussed with the                         patient. All questions were answered and informed                         consent was obtained.                        - The risks and benefits of the procedure and the                         sedation options and risks were discussed with the                         patient. All questions were answered and informed                         consent was obtained.                        - ASA Grade Assessment: II - A patient with mild  systemic disease.                        After obtaining informed consent, the colonoscope was                         passed under direct vision. Throughout the procedure,                         the patient's blood pressure, pulse, and oxygen                         saturations were  monitored continuously. The                         Colonoscope was introduced through the anus and                         advanced to the the cecum, identified by the                         appendiceal orifice. The colonoscopy was somewhat                         difficult due to a tortuous colon. Successful                         completion of the procedure was aided by withdrawing                         the scope and replacing with the pediatric                         colonoscope. The patient tolerated the procedure well.                         The quality of the bowel preparation was adequate. The                         ileocecal valve, appendiceal orifice, and rectum were                         photographed. Findings:      The perianal and digital rectal examinations were normal.      Multiple large-mouthed diverticula were found in the entire colon.      Two sessile polyps were found in the sigmoid colon. The polyps were 5 to       6 mm in size. These polyps were removed with a cold snare. Resection and       retrieval were complete.      Two semi-sessile polyps were found in the sigmoid colon. The polyps were       10 to 12 mm in size. These polyps were removed with a hot snare.       Resection and retrieval were complete. To prevent bleeding after the       polypectomy, two hemostatic clips were successfully placed. Clip       manufacturer: [employer]. There was no bleeding at the end of the       procedure.  A 5 mm polyp was found in the cecum. The polyp was sessile. The polyp       was removed with a cold snare. Resection and retrieval were complete.      Four sessile polyps were found in the ascending colon. The polyps were 5       to 6 mm in size. These polyps were removed with a cold snare. Resection       and retrieval were complete.      The exam was otherwise without abnormality on direct and retroflexion       views. Impression:            -  Diverticulosis in the entire examined colon.                        - Two 5 to 6 mm polyps in the sigmoid colon, removed                         with a cold snare. Resected and retrieved.                        - Two 10 to 12 mm polyps in the sigmoid colon, removed                         with a hot snare. Resected and retrieved. Clips were                         placed. Clip manufacturer: [employer].                        - One 5 mm polyp in the cecum, removed with a cold                         snare. Resected and retrieved.                        - Four 5 to 6 mm polyps in the ascending colon,                         removed with a cold snare. Resected and retrieved.                        - The examination was otherwise normal on direct and                         retroflexion views. Recommendation:        - Discharge patient to home.                        - Resume previous diet.                        - Continue present medications.                        - Await pathology results.                        - Repeat colonoscopy is not recommended due to current  age (13 years or older) for surveillance. Procedure Code(s):     --- Professional ---                        [phone], Colonoscopy, flexible; with removal of                         tumor(s), polyp(s), or other lesion(s) by snare                         technique Diagnosis Code(s):     --- Professional ---                        Z12.11, Encounter for screening for malignant neoplasm                         of colon                        R19.5, Other fecal abnormalities                        D12.5, Benign neoplasm of sigmoid colon                        D12.2, Benign neoplasm of ascending colon                        D12.0, Benign neoplasm of cecum                        K57.30, Diverticulosis of large intestine without                         perforation or abscess without bleeding CPT  copyright 2022 American Medical Association. All rights reserved. The codes documented in this report are preliminary and upon coder review may  be revised to meet current compliance requirements. Wyline Mood, MD Wyline Mood MD, MD 06/25/2023 2:05:57 PM This report has been signed electronically. Number of Addenda: 0 Note Initiated On: 06/25/2023 12:54 PM Scope Withdrawal Time: 0 hours 28 minutes 34 seconds  Total Procedure Duration: 0 hours 42 minutes 13 seconds  Estimated Blood Loss:  Estimated blood loss: none.      [facility]

## 2023-06-26 ENCOUNTER — Encounter: Payer: Self-pay | Admitting: Gastroenterology

## 2023-06-26 LAB — SURGICAL PATHOLOGY

## 2023-06-26 NOTE — Anesthesia Postprocedure Evaluation (Addendum)
 Anesthesia Post Note  Patient: Kara [name]  Procedure(s) Performed: COLONOSCOPY WITH PROPOFOL POLYPECTOMY CONTROL OF HEMORRHAGE, GI TRACT, ENDOSCOPIC, BY CLIPPING OR OVERSEWING  Patient location during evaluation: Endoscopy Anesthesia Type: General Level of consciousness: awake and alert Pain management: pain level controlled Vital Signs Assessment: post-procedure vital signs reviewed and stable Respiratory status: spontaneous breathing, nonlabored ventilation, respiratory function stable and patient connected to nasal cannula oxygen Cardiovascular status: blood pressure returned to baseline and stable Postop Assessment: no apparent nausea or vomiting Anesthetic complications: no   There were no known notable events for this encounter.   Last Vitals:  Vitals:   06/25/23 1418 06/25/23 1426  BP: 125/72 136/65  Pulse: 65 (!) 101  Resp: 15 20  Temp:    SpO2: 100% 95%    Last Pain:  Vitals:   06/26/23 0736  TempSrc:   PainSc: 0-No pain                 Cleda Mccreedy Milena Liggett

## 2023-07-01 DIAGNOSIS — K080 Exfoliation of teeth due to systemic causes: Secondary | ICD-10-CM | POA: Diagnosis not present

## 2023-07-07 ENCOUNTER — Encounter: Payer: Self-pay | Admitting: Gastroenterology

## 2023-07-11 ENCOUNTER — Ambulatory Visit: Payer: Medicare Other | Admitting: Podiatry

## 2023-07-11 ENCOUNTER — Encounter: Payer: Self-pay | Admitting: Podiatry

## 2023-07-11 VITALS — Ht 60.0 in | Wt 182.6 lb

## 2023-07-11 DIAGNOSIS — M79675 Pain in left toe(s): Secondary | ICD-10-CM

## 2023-07-11 DIAGNOSIS — M79674 Pain in right toe(s): Secondary | ICD-10-CM | POA: Diagnosis not present

## 2023-07-11 DIAGNOSIS — B351 Tinea unguium: Secondary | ICD-10-CM

## 2023-07-11 DIAGNOSIS — L84 Corns and callosities: Secondary | ICD-10-CM | POA: Diagnosis not present

## 2023-07-11 DIAGNOSIS — E1140 Type 2 diabetes mellitus with diabetic neuropathy, unspecified: Secondary | ICD-10-CM | POA: Diagnosis not present

## 2023-07-11 DIAGNOSIS — Z89421 Acquired absence of other right toe(s): Secondary | ICD-10-CM | POA: Diagnosis not present

## 2023-07-11 DIAGNOSIS — Z794 Long term (current) use of insulin: Secondary | ICD-10-CM

## 2023-07-14 ENCOUNTER — Other Ambulatory Visit: Payer: Self-pay | Admitting: Dermatology

## 2023-07-14 DIAGNOSIS — Z79899 Other long term (current) drug therapy: Secondary | ICD-10-CM

## 2023-07-16 ENCOUNTER — Other Ambulatory Visit: Payer: Self-pay | Admitting: Nurse Practitioner

## 2023-07-16 DIAGNOSIS — Z760 Encounter for issue of repeat prescription: Secondary | ICD-10-CM

## 2023-07-18 ENCOUNTER — Encounter: Payer: Self-pay | Admitting: Podiatry

## 2023-07-18 NOTE — Progress Notes (Signed)
 Subjective:  Patient ID: Norville Haggard, female    DOB: 02-Nov-1945,  MRN: [id number]  AVALEE CASTRELLON presents to clinic today for at risk foot care. Patient has h/o NIDDM, neuropathy with amputation of digital amputation R 2nd toe and corn(s) left foot and painful thick toenails that are difficult to trim. Painful toenails interfere with ambulation. Aggravating factors include wearing enclosed shoe gear. Pain is relieved with periodic professional debridement. Painful corns are aggravated when weightbearing when wearing enclosed shoe gear. Pain is relieved with periodic professional debridement.  She is accompanied by her husband on today's visit. Chief Complaint  Patient presents with   Nail Problem    Pt is here for [facility] last A1C was 7 PCP is Dr Tedd Sias and LOV was in December.   New problem(s): None.   PCP is [name], NP.  Allergies  Allergen Reactions   Amoxicillin Nausea And Vomiting    Review of Systems: Negative except as noted in the HPI.  Objective:  There were no vitals filed for this visit. PENIEL BIEL is a pleasant 78 y.o. female in NAD. AAO x 3.  Vascular Examination: Capillary refill time immediate b/l. Vascular status intact b/l with palpable pedal pulses. Pedal hair present b/l. No pain with calf compression b/l. Skin temperature gradient WNL b/l. No cyanosis or clubbing b/l. No ischemia or gangrene noted b/l. Trace edema noted BLE.  Neurological Examination: Sensation grossly intact b/l with 10 gram monofilament. Vibratory sensation intact b/l.   Dermatological Examination: Well healed surgical scar at amputation site of right 2nd toe. Pedal skin with normal turgor, texture and tone b/l.  No open wounds. No interdigital macerations.   Toenails 1-5 left, 1, 3-5 right thick, discolored, elongated with subungual debris and pain on dorsal palpation.   Minimal hyperkeratos(is/es) noted distal tip of left 2nd toe.  Musculoskeletal Examination: Muscle  strength 5/5 to all lower extremity muscle groups bilaterally. Lower extremity amputation(s): digital amputation R 2nd toe.  Radiographs: None  Last A1c:      Latest Ref Rng & Units 04/07/2023    9:00 AM 12/19/2022   10:46 AM 08/05/2022    9:36 AM  Hemoglobin A1C  Hemoglobin-A1c 4.0 - 5.6 % 7.3  8.3  6.5    Assessment/Plan: 1. Pain due to onychomycosis of toenails of both feet   2. Callus of foot   3. Status post amputation of lesser toe of right foot (HCC)   4. Type 2 diabetes mellitus with diabetic neuropathy, with long-term current use of insulin (HCC)     -Consent given for treatment as described below: -Examined patient. -Continue foot and shoe inspections daily. Monitor blood glucose per PCP/Endocrinologist's recommendations. -Discussed diabetic shoe benefit available based on patient's diagnoses. Patient/POA declines on today's visit. -Toenails were debrided in length and girth 1-5 right foot, 3-5 right foot, and right great toe with sterile nail nippers and dremel without iatrogenic bleeding.  -Corn(s) L 2nd toe pared utilizing sharp debridement with sterile blade without complication or incident. Total number debrided=1. -Patient/POA to call should there be question/concern in the interim.   Return in about 9 weeks (around 09/12/2023).  Freddie Breech, DPM      [place] LOCATION: [address]  [place], [place] [address]                   Office [phone]   [facility] LOCATION: [address] [place], [place] [address] Office [phone]

## 2023-07-21 ENCOUNTER — Other Ambulatory Visit: Payer: Self-pay

## 2023-07-21 ENCOUNTER — Telehealth: Payer: Self-pay

## 2023-07-21 DIAGNOSIS — Z760 Encounter for issue of repeat prescription: Secondary | ICD-10-CM

## 2023-07-21 MED ORDER — PANTOPRAZOLE SODIUM 40 MG PO TBEC: 40.0000 mg | DELAYED_RELEASE_TABLET | Freq: Every day | ORAL | 1 refills | Status: DC

## 2023-08-01 ENCOUNTER — Other Ambulatory Visit: Payer: Self-pay | Admitting: Nurse Practitioner

## 2023-08-01 DIAGNOSIS — G834 Cauda equina syndrome: Secondary | ICD-10-CM

## 2023-08-01 DIAGNOSIS — M4800 Spinal stenosis, site unspecified: Secondary | ICD-10-CM

## 2023-08-01 DIAGNOSIS — E1142 Type 2 diabetes mellitus with diabetic polyneuropathy: Secondary | ICD-10-CM

## 2023-08-01 NOTE — Telephone Encounter (Signed)
 Please send next appt 08/06/23

## 2023-08-06 ENCOUNTER — Ambulatory Visit: Payer: Medicare HMO | Admitting: Nurse Practitioner

## 2023-08-06 ENCOUNTER — Encounter: Payer: Self-pay | Admitting: Nurse Practitioner

## 2023-08-06 VITALS — BP 138/75 | HR 82 | Temp 98.3°F | Resp 16 | Ht 60.0 in | Wt 190.2 lb

## 2023-08-06 DIAGNOSIS — I152 Hypertension secondary to endocrine disorders: Secondary | ICD-10-CM

## 2023-08-06 DIAGNOSIS — I10 Essential (primary) hypertension: Secondary | ICD-10-CM

## 2023-08-06 DIAGNOSIS — M4800 Spinal stenosis, site unspecified: Secondary | ICD-10-CM | POA: Diagnosis not present

## 2023-08-06 DIAGNOSIS — E1159 Type 2 diabetes mellitus with other circulatory complications: Secondary | ICD-10-CM | POA: Diagnosis not present

## 2023-08-06 DIAGNOSIS — E1122 Type 2 diabetes mellitus with diabetic chronic kidney disease: Secondary | ICD-10-CM | POA: Diagnosis not present

## 2023-08-06 DIAGNOSIS — Z794 Long term (current) use of insulin: Secondary | ICD-10-CM | POA: Diagnosis not present

## 2023-08-06 DIAGNOSIS — E1142 Type 2 diabetes mellitus with diabetic polyneuropathy: Secondary | ICD-10-CM

## 2023-08-06 DIAGNOSIS — G834 Cauda equina syndrome: Secondary | ICD-10-CM

## 2023-08-06 LAB — POCT GLYCOSYLATED HEMOGLOBIN (HGB A1C): Hemoglobin A1C: 7.6 % — AB (ref 4.0–5.6)

## 2023-08-06 MED ORDER — INSULIN GLARGINE 100 UNIT/ML SOLOSTAR PEN: 40.0000 [IU] | PEN_INJECTOR | Freq: Every day | SUBCUTANEOUS | 11 refills | Status: DC

## 2023-08-06 MED ORDER — PREGABALIN 100 MG PO CAPS: 100.0000 mg | ORAL_CAPSULE | Freq: Two times a day (BID) | ORAL | 0 refills | Status: DC

## 2023-08-06 MED ORDER — TORSEMIDE 20 MG PO TABS: 20.0000 mg | ORAL_TABLET | Freq: Two times a day (BID) | ORAL | 3 refills | Status: DC

## 2023-08-06 MED ORDER — TRULICITY 1.5 MG/0.5ML ~~LOC~~ SOAJ: 1.5000 mg | SUBCUTANEOUS | 5 refills | Status: DC

## 2023-08-06 NOTE — Telephone Encounter (Signed)
 Patient was seen in office today.

## 2023-08-06 NOTE — Progress Notes (Signed)
 [facility] [address] [place], [place] [address]  Internal MEDICINE  Office Visit Note  Patient Name: Kara [name]  [id number]  [id number]  Date of Service: 08/06/2023  Chief Complaint  Patient presents with   Diabetes   Hyperlipidemia   Hypertension   Follow-up    HPI Kaitlen presents for a follow-up visit for diabetes, hypertension, neuropathy, and medication refills.  Diabetes -- A1c is slightly increased to 7.6 today. Trulicity  is too expensive and having issues with stock. Switch trulicity  to ozempic . Consider PAP for ozempic .  Hypertension -- on torsemide  Neuropathy -- takes pregabalin  twice daily  Medication list reviewed, all medications bottles reviewed during office visit.     Current Medication: Outpatient Encounter Medications as of 08/06/2023  Medication Sig Note   acetaminophen  (TYLENOL ) 325 MG tablet Take 2 tablets (650 mg total) by mouth 3 (three) times daily.    Aflibercept  (EYLEA ) 2 MG/0.05ML SOLN  12/22/2021: Every 3 months   Calcium -Magnesium -Vitamin D  (CALCIUM  1200+D3 PO) Take 1 tablet by mouth daily.    Collagen-Boron-Hyaluronic Acid (MOVE FREE ULTRA JOINT HEALTH) 40-5-3.3 MG TABS Take 1 tablet by mouth daily.    doxycycline  (MONODOX ) 100 MG capsule TAKE 1 CAPSULE BY MOUTH TWICE DAILY WITH FOOD    Ferrous Gluconate  324 (37.5 Fe) MG TABS Take 324 mg by mouth daily.    gentamicin  cream (GARAMYCIN ) 0.1 % Apply to ulcers once daily.    hydrocortisone  2.5 % cream APPLY TO AFFECTED AREAS BUTTOCKS ONCE TO TWICE DAILY UNTIL IMPROVED.    insulin  aspart (NOVOLOG  FLEXPEN) 100 UNIT/ML FlexPen INJECT [place] 3 times daily with meals AS DIRECTED PER SLIDING SCALE. MAX DAILY DOSE IS 30 UNITS. DX E11.65.    ketoconazole  (NIZORAL ) 2 % cream Apply to affected area buttocks twice daily until improved.    Krill Oil 500 MG CAPS Take 1 capsule by mouth daily.    Lactobacillus (PROBIOTIC ACIDOPHILUS) CAPS Take 1 capsule by mouth daily.    Multiple Vitamin (MULTIVITAMIN)  tablet Take 1 tablet by mouth daily at 12 noon.    pantoprazole  (PROTONIX ) 40 MG tablet Take 1 tablet (40 mg total) by mouth daily.    rosuvastatin  (CRESTOR ) 20 MG tablet Take 1 tablet (20 mg total) by mouth daily. TAKE 1 TABLET BY MOUTH EVERYDAY AT BEDTIME    tamoxifen  (NOLVADEX ) 20 MG tablet Take 1 tablet (20 mg total) by mouth daily.    torsemide  (DEMADEX ) 20 MG tablet Take 1 tablet (20 mg total) by mouth 2 (two) times daily.    [DISCONTINUED] Dulaglutide  (TRULICITY ) 1.5 MG/0.5ML SOAJ Inject 1.5 mg into the skin once a week.    [DISCONTINUED] furosemide  (LASIX ) 20 MG tablet Take 2 tablets by mouth in the AM and take 1 tablet by mouth in the PM. May add 1 tablet to afternoon dose if swelling not improved.    [DISCONTINUED] insulin  aspart (NOVOLOG  FLEXPEN) 100 UNIT/ML FlexPen INJECT UNDER THE SKIN TWICE DAILY AS DIRECTED PER SLIDING SCALE. MAX DAILY DOSE IS 30 UNITS.    [DISCONTINUED] insulin  glargine (LANTUS ) 100 UNIT/ML Solostar Pen Inject 40 Units into the skin daily.    [DISCONTINUED] pregabalin  (LYRICA ) 100 MG capsule TAKE 1 CAPSULE(100 MG) BY MOUTH TWICE DAILY    [DISCONTINUED] triamcinolone  (KENALOG ) 0.025 % cream Apply 1 Application topically 2 (two) times daily as needed (itching). To groin area    insulin  glargine (LANTUS ) 100 UNIT/ML Solostar Pen Inject 40 Units into the skin daily.    [DISCONTINUED] Dulaglutide  (TRULICITY ) 1.5 MG/0.5ML SOAJ Inject 1.5  mg into the skin once a week.    [DISCONTINUED] pregabalin  (LYRICA ) 100 MG capsule Take 1 capsule (100 mg total) by mouth 2 (two) times daily.    No facility-administered encounter medications on file as of 08/06/2023.    Surgical History: Past Surgical History:  Procedure Laterality Date   AMPUTATION TOE Right 02/26/2023   Procedure: SECOND DIGIT AMPUTATION;  Surgeon: Floyce Hutching, DPM;  Location: [facility] ORS;  Service: Orthopedics/Podiatry;  Laterality: Right;   ANTERIOR LATERAL LUMBAR FUSION WITH PERCUTANEOUS SCREW 2 LEVEL N/A  07/03/2022   Procedure: L3-5 LATERAL LUMBAR INTERBODY FUSION WITH POSTERIOR SPINAL FUSION;  Surgeon: Jodeen Munch, MD;  Location: [facility];  Service: Neurosurgery;  Laterality: N/A;   APPLICATION OF INTRAOPERATIVE CT SCAN N/A 07/03/2022   Procedure: APPLICATION OF INTRAOPERATIVE CT SCAN;  Surgeon: Jodeen Munch, MD;  Location: [facility] OR[facility];  Service: Neurosurgery;  Laterality: N/A;   BILATERAL SALPINGECTOMY     BREAST BIOPSY Right 2014   neg- core   BREAST BIOPSY Right 12/21/2018   US  bx, venus clip,  DUCTAL CARCINOMA IN SITU   BREAST BIOPSY Right 12/21/2018   US  bx, ribbon clip,  FIBROEPITHELIAL PROLIFERATION WITH SCLEROSIS   BREAST BIOPSY Right 01/01/2019   Affirm bx "X" clip-path pending   BREAST DUCTAL SYSTEM EXCISION     BREAST EXCISIONAL BIOPSY Right 2014   neg   BREAST LUMPECTOMY Right 01/22/2019   path pending   CATARACT EXTRACTION     CHOLECYSTECTOMY     COLONOSCOPY WITH PROPOFOL  N/A 06/25/2023   Procedure: COLONOSCOPY WITH PROPOFOL ;  Surgeon: Luke Salaam, MD;  Location: [facility] ENDOSCOPY;  Service: Gastroenterology;  Laterality: N/A;   CYSTOSCOPY WITH STENT PLACEMENT Left 04/14/2015   Procedure: CYSTOSCOPY WITH STENT PLACEMENT;  Surgeon: Dustin Gimenez, MD;  Location: [facility] OR[facility];  Service: Urology;  Laterality: Left;   EXTRACORPOREAL SHOCK WAVE LITHOTRIPSY Left 09/29/2014   Procedure: EXTRACORPOREAL SHOCK WAVE LITHOTRIPSY (ESWL);  Surgeon: Dustin Gimenez, MD;  Location: [facility] OR[facility];  Service: Urology;  Laterality: Left;   EYE SURGERY     bilateral cataract   HEMOSTASIS CLIP PLACEMENT  06/25/2023   Procedure: CONTROL OF HEMORRHAGE, GI TRACT, ENDOSCOPIC, BY CLIPPING OR OVERSEWING;  Surgeon: Luke Salaam, MD;  Location: [facility] ENDOSCOPY;  Service: Gastroenterology;;   IRRIGATION AND DEBRIDEMENT ABSCESS Right 03/24/2019   Procedure: IRRIGATION AND DEBRIDEMENT ABSCESS RIGHT BREAST;  Surgeon: Eldred Grego, MD;  Location: [facility] ORS;  Service: General;  Laterality: Right;   LAPAROSCOPIC  OOPHERECTOMY Left    PARTIAL MASTECTOMY WITH NEEDLE LOCALIZATION Right 01/22/2019   Procedure: PARTIAL MASTECTOMY WITH NEEDLE LOCALIZATION;  Surgeon: Eldred Grego, MD;  Location: [facility] OR[facility];  Service: General;  Laterality: Right;   POLYPECTOMY  06/25/2023   Procedure: POLYPECTOMY;  Surgeon: Luke Salaam, MD;  Location: [facility] ENDOSCOPY;  Service: Gastroenterology;;   TONSILLECTOMY     TUBAL LIGATION     URETEROSCOPY WITH HOLMIUM LASER LITHOTRIPSY Left 04/14/2015   Procedure: URETEROSCOPY WITH HOLMIUM LASER LITHOTRIPSY;  Surgeon: Dustin Gimenez, MD;  Location: [facility] ORS;  Service: Urology;  Laterality: Left;    Medical History: Past Medical History:  Diagnosis Date   Anemia    Arthropathy    Benign neoplasm of breast    Breast cancer (HCC)    DCIS   Chest pain    CKD (chronic kidney disease), stage IV (HCC)    Diabetes (HCC)    Diabetic retinopathy (HCC)    Dyspnea    Edema    History of kidney stones    Hyperlipemia  Hypersomnia with sleep apnea    Hypertension    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HCC)    Lumbago    Neuropathy    Osteoarthrosis    Osteoporosis    Ovarian failure    Personal history of radiation therapy    Pneumonia due to COVID-19 virus 12/21/2021   a.) required inpatient admission   SOB (shortness of breath)     Family History: Family History  Problem Relation Age of Onset   Breast cancer Maternal Grandmother 24   Colon cancer Mother    Lung cancer Father     Social History   Socioeconomic History   Marital status: Married    Spouse name: Not on file   Number of children: Not on file   Years of education: Not on file   Highest education level: Not on file  Occupational History   Not on file  Tobacco Use   Smoking status: Former    Current packs/day: 0.00    Average packs/day: 1 pack/day for 30.0 years (30.0 ttl pk-yrs)    Types: Cigarettes    Start date: 04/10/1965    Quit date: 04/11/1995    Years since quitting: 28.4    Smokeless tobacco: Never  Vaping Use   Vaping status: Never Used  Substance and Sexual Activity   Alcohol use: No    Alcohol/week: 0.0 standard drinks of alcohol   Drug use: No   Sexual activity: Not on file  Other Topics Concern   Not on file  Social History Narrative   Not on file   Social Drivers of Health   Financial Resource Strain: Low Risk  (10/03/2020)   Overall Financial Resource Strain (CARDIA)    Difficulty of Paying Living Expenses: Not very hard  Food Insecurity: No Food Insecurity (02/24/2023)   Hunger Vital Sign    Worried About Running Out of Food in the Last Year: Never true    Ran Out of Food in the Last Year: Never true  Transportation Needs: No Transportation Needs (02/24/2023)   PRAPARE - Administrator, Civil Service (Medical): No    Lack of Transportation (Non-Medical): No  Physical Activity: Not on file  Stress: Not on file  Social Connections: Not on file  Intimate Partner Violence: Not At Risk (02/24/2023)   Humiliation, Afraid, Rape, and Kick questionnaire    Fear of Current or Ex-Partner: No    Emotionally Abused: No    Physically Abused: No    Sexually Abused: No      Review of Systems  Constitutional:  Positive for fatigue. Negative for chills and unexpected weight change.  HENT:  Negative for congestion, rhinorrhea, sneezing and sore throat.   Eyes:  Negative for redness.  Respiratory: Negative.  Negative for cough, chest tightness, shortness of breath and wheezing.   Cardiovascular: Negative.  Negative for chest pain and palpitations.  Gastrointestinal: Negative.  Negative for abdominal pain, constipation, diarrhea, nausea and vomiting.  Genitourinary:  Negative for dysuria and frequency.  Musculoskeletal:  Positive for arthralgias and gait problem (using cane). Negative for back pain, joint swelling and neck pain.  Skin:  Negative for rash.  Neurological:  Negative for tremors and numbness.  Hematological:  Negative for  adenopathy. Does not bruise/bleed easily.  Psychiatric/Behavioral:  Negative for behavioral problems (Depression), sleep disturbance and suicidal ideas. The patient is not nervous/anxious.     Vital Signs: BP (!) 157/83   Pulse 82   Temp 98.3 F (36.8 C)   Resp  16   Ht 5' (1.524 m)   Wt 190 lb 3.2 oz (86.3 kg)   SpO2 98%   BMI 37.15 kg/m    Physical Exam Vitals reviewed.  Constitutional:      General: She is not in acute distress.    Appearance: Normal appearance. She is obese. She is not ill-appearing.  HENT:     Head: Normocephalic and atraumatic.  Eyes:     Pupils: Pupils are equal, round, and reactive to light.  Cardiovascular:     Rate and Rhythm: Normal rate and regular rhythm.  Pulmonary:     Effort: Pulmonary effort is normal. No respiratory distress.  Neurological:     Mental Status: She is alert and oriented to person, place, and time.  Psychiatric:        Mood and Affect: Mood normal.        Behavior: Behavior normal.        Assessment/Plan: 1. Type 2 diabetes mellitus with chronic kidney disease, with long-term current use of insulin , unspecified CKD stage (HCC) (Primary) A1c is elevated. Switch to ozempic  and continue lantus  as prescribed.  - POCT glycosylated hemoglobin (Hb A1C) - insulin  glargine (LANTUS ) 100 UNIT/ML Solostar Pen; Inject 40 Units into the skin daily.  Dispense: 15 mL; Refill: 11  2. Hypertension associated with diabetes (HCC)  Continue torsemide  as prescribed - torsemide  (DEMADEX ) 20 MG tablet; Take 1 tablet (20 mg total) by mouth 2 (two) times daily.  Dispense: 60 tablet; Refill: 3  3. Diabetic polyneuropathy associated with type 2 diabetes mellitus (HCC) Continue pregabalin  as prescribed.   4. Central stenosis of spinal canal History of back surgery  5. Cauda equina syndrome (HCC) History of back surgery   General Counseling: Thurley verbalizes understanding of the findings of todays visit and agrees with plan of treatment. I  have discussed any further diagnostic evaluation that may be needed or ordered today. We also reviewed her medications today. she has been encouraged to call the office with any questions or concerns that should arise related to todays visit.    Orders Placed This Encounter  Procedures   POCT glycosylated hemoglobin (Hb A1C)    Meds ordered this encounter  Medications   DISCONTD: Dulaglutide  (TRULICITY ) 1.5 MG/0.5ML SOAJ    Sig: Inject 1.5 mg into the skin once a week.    Dispense:  2 mL    Refill:  5    Dx code E11.65, send pa request asap please.   insulin  glargine (LANTUS ) 100 UNIT/ML Solostar Pen    Sig: Inject 40 Units into the skin daily.    Dispense:  15 mL    Refill:  11    Dx code E11.65; fill for 1 month should be 5 pens   DISCONTD: pregabalin  (LYRICA ) 100 MG capsule    Sig: Take 1 capsule (100 mg total) by mouth 2 (two) times daily.    Dispense:  180 capsule    Refill:  0    Cancel all previous orders and fill for 90 days thanks   torsemide  (DEMADEX ) 20 MG tablet    Sig: Take 1 tablet (20 mg total) by mouth 2 (two) times daily.    Dispense:  60 tablet    Refill:  3    Fill new script today, discontinue furosemide     Return in about 4 weeks (around 09/03/2023) for F/U, eval new med, Lupe Bonner PCP.   Total time spent:30 Minutes Time spent includes review of chart, medications, test results, and follow  up plan with the patient.   [place] Controlled Substance Database was reviewed by me.  This patient was seen by Laurence Pons, FNP-C in collaboration with Dr. Verneta Gone as a part of collaborative care agreement.   Lavaughn Bisig R. Bobbi Burow, MSN, FNP-C Internal medicine

## 2023-08-07 ENCOUNTER — Inpatient Hospital Stay: Payer: Medicare HMO | Attending: Oncology | Admitting: Oncology

## 2023-08-07 DIAGNOSIS — D0511 Intraductal carcinoma in situ of right breast: Secondary | ICD-10-CM

## 2023-08-07 NOTE — Progress Notes (Signed)
 [facility]  Telephone:([phone]) [phone] Fax:(336) [phone]  ID: Kara [name] OB: Dec 07, 1945  MR#: [id number]  AOZ#:[id number]  Patient Care Team: [name], NP as PCP - General (Nurse Practitioner) [name], MD as Consulting Physician (Oncology) [name], MD as Consulting Physician (General Surgery) [name], MD as Referring Physician (Radiation Oncology) [name], [facility] (Inactive) as Pharmacist (Pharmacist)  I connected with Kara [name] on 08/07/23 at  2:15 PM EDT by video enabled telemedicine visit and verified that I am speaking with the correct person using two identifiers.   I discussed the limitations, risks, security and privacy concerns of performing an evaluation and management service by telemedicine and the availability of in-person appointments. I also discussed with the patient that there may be a patient responsible charge related to this service. The patient expressed understanding and agreed to proceed.   Other persons participating in the visit and their role in the encounter: Patient, MD.  Patient's location: Home. Provider's location: Clinic.  CHIEF COMPLAINT: Low-grade DCIS of the right breast.  INTERVAL HISTORY: Patient agreed to video-assisted telemedicine visit for routine 48-month evaluation.  She continues to feel well and remains asymptomatic.  She is tolerating her tamoxifen without significant side effects.  She has no neurologic complaints.  She denies any recent fevers or illnesses.  She has a good appetite and denies weight loss.  She has no chest pain, shortness of breath, cough, or hemoptysis.  She denies any nausea, vomiting, constipation, or diarrhea.  She has no urinary complaints.  Patient offers no specific complaints today.  REVIEW OF SYSTEMS:   Review of Systems  Constitutional: Negative.  Negative for fever, malaise/fatigue and weight loss.  Respiratory: Negative.  Negative for  cough, hemoptysis and shortness of breath.   Cardiovascular: Negative.  Negative for chest pain and leg swelling.  Gastrointestinal: Negative.  Negative for abdominal pain.  Genitourinary: Negative.  Negative for dysuria.  Musculoskeletal: Negative.  Negative for back pain and joint pain.  Skin:  Negative for itching and rash.  Neurological: Negative.  Negative for dizziness, focal weakness, weakness and headaches.  Psychiatric/Behavioral: Negative.  The patient is not nervous/anxious.     As per HPI. Otherwise, a complete review of systems is negative.  PAST MEDICAL HISTORY: Past Medical History:  Diagnosis Date   Anemia    Arthropathy    Benign neoplasm of breast    Breast cancer (HCC)    DCIS   Chest pain    CKD (chronic kidney disease), stage IV (HCC)    Diabetes (HCC)    Diabetic retinopathy (HCC)    Dyspnea    Edema    History of kidney stones    Hyperlipemia    Hypersomnia with sleep apnea    Hypertension    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HCC)    Lumbago    Neuropathy    Osteoarthrosis    Osteoporosis    Ovarian failure    Personal history of radiation therapy    Pneumonia due to COVID-19 virus 12/21/2021   a.) required inpatient admission   SOB (shortness of breath)     PAST SURGICAL HISTORY: Past Surgical History:  Procedure Laterality Date   AMPUTATION TOE Right 02/26/2023   Procedure: SECOND DIGIT AMPUTATION;  Surgeon: Edwin Cap, DPM;  Location: [facility] OR[facility];  Service: Orthopedics/Podiatry;  Laterality: Right;   ANTERIOR LATERAL LUMBAR FUSION WITH PERCUTANEOUS SCREW 2 LEVEL N/A 07/03/2022   Procedure: L3-5 LATERAL LUMBAR INTERBODY FUSION WITH POSTERIOR SPINAL FUSION;  Surgeon: Venetia Night, MD;  Location: [facility];  Service: Neurosurgery;  Laterality: N/A;   APPLICATION OF INTRAOPERATIVE CT SCAN N/A 07/03/2022   Procedure: APPLICATION OF INTRAOPERATIVE CT SCAN;  Surgeon: Venetia Night, MD;  Location: [facility];  Service: Neurosurgery;   Laterality: N/A;   BILATERAL SALPINGECTOMY     BREAST BIOPSY Right 2014   neg- core   BREAST BIOPSY Right 12/21/2018   [place] bx, venus clip,  DUCTAL CARCINOMA IN SITU   BREAST BIOPSY Right 12/21/2018   [place] bx, ribbon clip,  FIBROEPITHELIAL PROLIFERATION WITH SCLEROSIS   BREAST BIOPSY Right 01/01/2019   Affirm bx "X" clip-path pending   BREAST DUCTAL SYSTEM EXCISION     BREAST EXCISIONAL BIOPSY Right 2014   neg   BREAST LUMPECTOMY Right 01/22/2019   path pending   CATARACT EXTRACTION     CHOLECYSTECTOMY     COLONOSCOPY WITH PROPOFOL N/A 06/25/2023   Procedure: COLONOSCOPY WITH PROPOFOL;  Surgeon: Wyline Mood, MD;  Location: [facility];  Service: Gastroenterology;  Laterality: N/A;   CYSTOSCOPY WITH STENT PLACEMENT Left 04/14/2015   Procedure: CYSTOSCOPY WITH STENT PLACEMENT;  Surgeon: Vanna Scotland, MD;  Location: [facility];  Service: Urology;  Laterality: Left;   EXTRACORPOREAL SHOCK WAVE LITHOTRIPSY Left 09/29/2014   Procedure: EXTRACORPOREAL SHOCK WAVE LITHOTRIPSY (ESWL);  Surgeon: Vanna Scotland, MD;  Location: [facility];  Service: Urology;  Laterality: Left;   EYE SURGERY     bilateral cataract   HEMOSTASIS CLIP PLACEMENT  06/25/2023   Procedure: CONTROL OF HEMORRHAGE, GI TRACT, ENDOSCOPIC, BY CLIPPING OR OVERSEWING;  Surgeon: Wyline Mood, MD;  Location: [facility] END[facility]COPY;  Service: Gastroenterology;;   IRRIGATION AND DEBRIDEMENT ABSCESS Right 03/24/2019   Procedure: IRRIGATION AND DEBRIDEMENT ABSCESS RIGHT BREAST;  Surgeon: [name], MD;  Location: [facility];  Service: General;  Laterality: Right;   LAPAROSCOPIC OOPHERECTOMY Left    PARTIAL MASTECTOMY WITH NEEDLE LOCALIZATION Right 01/22/2019   Procedure: PARTIAL MASTECTOMY WITH NEEDLE LOCALIZATION;  Surgeon: [name], MD;  Location: [facility];  Service: General;  Laterality: Right;   POLYPECTOMY  06/25/2023   Procedure: POLYPECTOMY;  Surgeon: Wyline Mood, MD;  Location: [facility]COPY;  Service: Gastroenterology;;    TONSILLECTOMY     TUBAL LIGATION     URETEROSCOPY WITH HOLMIUM LASER LITHOTRIPSY Left 04/14/2015   Procedure: URETEROSCOPY WITH HOLMIUM LASER LITHOTRIPSY;  Surgeon: Vanna Scotland, MD;  Location: [facility];  Service: Urology;  Laterality: Left;    FAMILY HISTORY: Family History  Problem Relation Age of Onset   Breast cancer Maternal Grandmother 33   Colon cancer Mother    Lung cancer Father     ADVANCED DIRECTIVES (Y/N):  N  HEALTH MAINTENANCE: Social History   Tobacco Use   Smoking status: Former    Current packs/day: 0.00    Average packs/day: 1 pack/day for 30.0 years (30.0 ttl pk-yrs)    Types: Cigarettes    Start date: 04/10/1965    Quit date: 04/11/1995    Years since quitting: 28.3   Smokeless tobacco: Never  Vaping Use   Vaping status: Never Used  Substance Use Topics   Alcohol use: No    Alcohol/week: 0.0 standard drinks of alcohol   Drug use: No     Colonoscopy:  PAP:  Bone density:  Lipid panel:  Allergies  Allergen Reactions   Amoxicillin Nausea And Vomiting    Current Outpatient Medications  Medication Sig Dispense Refill   acetaminophen (TYLENOL) 325 MG tablet Take 2 tablets (650 mg total) by mouth 3 (  three) times daily.     Aflibercept (EYLEA) 2 MG/0.05ML SOLN      Calcium-Magnesium-Vitamin D (CALCIUM 1200+D3 PO) Take 1 tablet by mouth daily.     Collagen-Boron-Hyaluronic Acid (MOVE FREE ULTRA JOINT HEALTH) 40-5-3.3 MG TABS Take 1 tablet by mouth daily.     doxycycline (MONODOX) 100 MG capsule TAKE 1 CAPSULE BY MOUTH TWICE DAILY WITH FOOD 60 capsule 2   Dulaglutide (TRULICITY) 1.5 MG/0.5ML SOAJ Inject 1.5 mg into the skin once a week. 2 mL 5   Ferrous Gluconate 324 (37.5 Fe) MG TABS Take 324 mg by mouth daily.     gentamicin cream (GARAMYCIN) 0.1 % Apply to ulcers once daily. 30 g 1   hydrocortisone 2.5 % cream APPLY TO AFFECTED AREAS BUTTOCKS ONCE TO TWICE DAILY UNTIL IMPROVED. 28 g 0   insulin aspart (NOVOLOG FLEXPEN) 100 UNIT/ML FlexPen INJECT   3 times daily with meals AS DIRECTED PER SLIDING SCALE. MAX DAILY DOSE IS 30 UNITS. DX E11.65. 15 mL 3   insulin glargine (LANTUS) 100 UNIT/ML Solostar Pen Inject 40 Units into the skin daily. 15 mL 11   ketoconazole (NIZORAL) 2 % cream Apply to affected area buttocks twice daily until improved. 30 g 0   Krill Oil 500 MG CAPS Take 1 capsule by mouth daily.     Lactobacillus (PROBIOTIC ACIDOPHILUS) CAPS Take 1 capsule by mouth daily.     Multiple Vitamin (MULTIVITAMIN) tablet Take 1 tablet by mouth daily at 12 noon.     pantoprazole (PROTONIX) 40 MG tablet Take 1 tablet (40 mg total) by mouth daily. 90 tablet 1   pregabalin (LYRICA) 100 MG capsule Take 1 capsule (100 mg total) by mouth 2 (two) times daily. 180 capsule 0   rosuvastatin (CRESTOR) 20 MG tablet Take 1 tablet (20 mg total) by mouth daily. TAKE 1 TABLET BY MOUTH EVERYDAY AT BEDTIME 90 tablet 3   tamoxifen (NOLVADEX) 20 MG tablet Take 1 tablet (20 mg total) by mouth daily. 90 tablet 3   torsemide (DEMADEX) 20 MG tablet Take 1 tablet (20 mg total) by mouth 2 (two) times daily. 60 tablet 3   triamcinolone (KENALOG) 0.025 % cream Apply 1 Application topically 2 (two) times daily as needed (itching). To groin area 80 g 5   No current facility-administered medications for this visit.    OBJECTIVE: There were no vitals filed for this visit.   There is no height or weight on file to calculate BMI.    ECOG FS:0 - Asymptomatic  General: Well-developed, well-nourished, no acute distress. HEENT: Normocephalic. Neuro: Alert, answering all questions appropriately. Cranial nerves grossly intact. Psych: Normal affect.  LAB RESULTS:  Lab Results  Component Value Date   NA 138 02/28/2023   K 3.2 (L) 02/28/2023   CL 108 02/28/2023   CO2 24 02/28/2023   GLUCOSE 140 (H) 02/28/2023   BUN 23 02/28/2023   CREATININE 1.73 (H) 02/28/2023   CALCIUM 8.1 (L) 02/28/2023   PROT 7.8 02/23/2023   ALBUMIN 3.2 (L) 02/23/2023   AST 26 02/23/2023    ALT 19 02/23/2023   ALKPHOS 81 02/23/2023   BILITOT 1.3 (H) 02/23/2023   GFRNONAA 30 (L) 02/28/2023   GFRAA 39 (L) 08/16/2019    Lab Results  Component Value Date   WBC 5.4 02/28/2023   NEUTROABS 9.8 (H) 02/23/2023   HGB 8.0 (L) 02/28/2023   HCT 26.0 (L) 02/28/2023   MCV 86.4 02/28/2023   PLT 133 (L) 02/28/2023     STUDIES:  No results found.  ASSESSMENT: Low-grade DCIS of the right breast.  PLAN:    Low-grade DCIS of the right breast: Patient declined enrollment in COMET trial.  She underwent lumpectomy on January 22, 2019.  Final pathology noted close, but clear margins.  Patient initiated XRT, but then discontinued after approximately 3 weeks. She was extremely upset how she was treated by the radiation oncology provider and refused to return or continue XRT.  We previously discussed the possibility of transferring care to another facility which she also declined.  She acknowledged that although she has low-grade DCIS, this increases her risk of recurrence.  No further interventions are needed.  Continue tamoxifen total 5 years completing treatment in December 2025.  Her most recent mammogram on February 03, 2023 was reported as BI-RADS 1.  Repeat mammogram in October 2025.  Return to clinic in 6 months after her mammogram for further evaluation. Bullous pemphigoid: Resolved.  Follow-up with dermatology as needed. Osteoporosis: Managed by primary care.  Patient discontinued Fosamax secondary to intolerance.    I provided 20 minutes of face-to-face video visit time during this encounter which included chart review, counseling, and coordination of care as documented above.    Patient expressed understanding and was in agreement with this plan. She also understands that She can call clinic at any time with any questions, concerns, or complaints.    Cancer Staging  Ductal carcinoma in situ (DCIS) of right breast Staging form: Breast, AJCC 8th Edition - Clinical stage from 01/01/2019:  Stage 0 (cTis (DCIS), cN0, cM0, G1, ER+, PR: Not Assessed, HER2: Not Assessed) - Signed by Shellie Dials, MD on 01/01/2019 Stage prefix: Initial diagnosis Histologic grading system: 3 grade system   Shellie Dials, MD   08/07/2023 3:10 PM

## 2023-08-12 DIAGNOSIS — H34832 Tributary (branch) retinal vein occlusion, left eye, with macular edema: Secondary | ICD-10-CM | POA: Diagnosis not present

## 2023-08-19 DIAGNOSIS — Z961 Presence of intraocular lens: Secondary | ICD-10-CM | POA: Diagnosis not present

## 2023-08-19 DIAGNOSIS — H16229 Keratoconjunctivitis sicca, not specified as Sjogren's, unspecified eye: Secondary | ICD-10-CM | POA: Diagnosis not present

## 2023-08-30 ENCOUNTER — Other Ambulatory Visit: Payer: Self-pay | Admitting: Nurse Practitioner

## 2023-08-30 DIAGNOSIS — M4800 Spinal stenosis, site unspecified: Secondary | ICD-10-CM

## 2023-08-30 DIAGNOSIS — E1142 Type 2 diabetes mellitus with diabetic polyneuropathy: Secondary | ICD-10-CM

## 2023-08-30 DIAGNOSIS — G834 Cauda equina syndrome: Secondary | ICD-10-CM

## 2023-09-03 ENCOUNTER — Telehealth: Payer: Self-pay

## 2023-09-03 ENCOUNTER — Ambulatory Visit: Admitting: Nurse Practitioner

## 2023-09-03 ENCOUNTER — Encounter: Payer: Self-pay | Admitting: Nurse Practitioner

## 2023-09-03 VITALS — BP 167/64 | HR 86 | Temp 98.0°F | Resp 16 | Ht 60.0 in | Wt 189.0 lb

## 2023-09-03 DIAGNOSIS — Z794 Long term (current) use of insulin: Secondary | ICD-10-CM

## 2023-09-03 DIAGNOSIS — E1122 Type 2 diabetes mellitus with diabetic chronic kidney disease: Secondary | ICD-10-CM | POA: Diagnosis not present

## 2023-09-03 DIAGNOSIS — I152 Hypertension secondary to endocrine disorders: Secondary | ICD-10-CM

## 2023-09-03 DIAGNOSIS — E785 Hyperlipidemia, unspecified: Secondary | ICD-10-CM

## 2023-09-03 DIAGNOSIS — E1169 Type 2 diabetes mellitus with other specified complication: Secondary | ICD-10-CM | POA: Diagnosis not present

## 2023-09-03 DIAGNOSIS — E1159 Type 2 diabetes mellitus with other circulatory complications: Secondary | ICD-10-CM | POA: Diagnosis not present

## 2023-09-03 DIAGNOSIS — E538 Deficiency of other specified B group vitamins: Secondary | ICD-10-CM

## 2023-09-03 DIAGNOSIS — E559 Vitamin D deficiency, unspecified: Secondary | ICD-10-CM

## 2023-09-03 DIAGNOSIS — L209 Atopic dermatitis, unspecified: Secondary | ICD-10-CM

## 2023-09-03 DIAGNOSIS — E1142 Type 2 diabetes mellitus with diabetic polyneuropathy: Secondary | ICD-10-CM

## 2023-09-03 MED ORDER — SEMAGLUTIDE (1 MG/DOSE) 4 MG/3ML ~~LOC~~ SOPN: 1.0000 mg | PEN_INJECTOR | SUBCUTANEOUS | 5 refills | Status: AC

## 2023-09-03 MED ORDER — TRIAMCINOLONE ACETONIDE 0.025 % EX CREA
1.0000 | TOPICAL_CREAM | Freq: Two times a day (BID) | CUTANEOUS | 5 refills | Status: AC | PRN
Start: 2023-09-03 — End: ?

## 2023-09-03 NOTE — Progress Notes (Signed)
 [facility] [address] [place], [place] [address]  Internal MEDICINE  Office Visit Note  Patient Name: Kara [name]  [id number]  [id number]  Date of Service: 09/03/2023  Chief Complaint  Patient presents with   Diabetes   Hypertension   Hyperlipidemia   Follow-up    HPI Bernadett presents for a follow-up visit for diabetes, hypertension, high cholesterol, neuropathy, refills and lab orders Diabetes -- A1c is elevated at 7.6.  Hypertension -- taking torsemide   High cholesterol -- taking rosuvastatin . Due for cholesterol panel Due for routine labs  Neuropathy -- taking pregabalin   Intermittent rash of the groin -- itchy, needs a topical steroid.     Current Medication: Outpatient Encounter Medications as of 09/03/2023  Medication Sig Note   acetaminophen  (TYLENOL ) 325 MG tablet Take 2 tablets (650 mg total) by mouth 3 (three) times daily.    Aflibercept  (EYLEA ) 2 MG/0.05ML SOLN  12/22/2021: Every 3 months   Calcium -Magnesium -Vitamin D  (CALCIUM  1200+D3 PO) Take 1 tablet by mouth daily.    Collagen-Boron-Hyaluronic Acid (MOVE FREE ULTRA JOINT HEALTH) 40-5-3.3 MG TABS Take 1 tablet by mouth daily.    doxycycline  (MONODOX ) 100 MG capsule TAKE 1 CAPSULE BY MOUTH TWICE DAILY WITH FOOD    Ferrous Gluconate  324 (37.5 Fe) MG TABS Take 324 mg by mouth daily.    gentamicin  cream (GARAMYCIN ) 0.1 % Apply to ulcers once daily.    hydrocortisone  2.5 % cream APPLY TO AFFECTED AREAS BUTTOCKS ONCE TO TWICE DAILY UNTIL IMPROVED.    insulin  aspart (NOVOLOG  FLEXPEN) 100 UNIT/ML FlexPen INJECT [place] 3 times daily with meals AS DIRECTED PER SLIDING SCALE. MAX DAILY DOSE IS 30 UNITS. DX E11.65.    insulin  glargine (LANTUS ) 100 UNIT/ML Solostar Pen Inject 40 Units into the skin daily.    ketoconazole  (NIZORAL ) 2 % cream Apply to affected area buttocks twice daily until improved.    Krill Oil 500 MG CAPS Take 1 capsule by mouth daily.    Lactobacillus (PROBIOTIC ACIDOPHILUS) CAPS Take 1 capsule  by mouth daily.    Multiple Vitamin (MULTIVITAMIN) tablet Take 1 tablet by mouth daily at 12 noon.    pantoprazole  (PROTONIX ) 40 MG tablet Take 1 tablet (40 mg total) by mouth daily.    pregabalin  (LYRICA ) 100 MG capsule TAKE 1 CAPSULE(100 MG) BY MOUTH TWICE DAILY    rosuvastatin  (CRESTOR ) 20 MG tablet Take 1 tablet (20 mg total) by mouth daily. TAKE 1 TABLET BY MOUTH EVERYDAY AT BEDTIME    Semaglutide , 1 MG/DOSE, 4 MG/3ML SOPN Inject 1 mg as directed once a week.    tamoxifen  (NOLVADEX ) 20 MG tablet Take 1 tablet (20 mg total) by mouth daily.    torsemide  (DEMADEX ) 20 MG tablet Take 1 tablet (20 mg total) by mouth 2 (two) times daily.    [DISCONTINUED] Dulaglutide  (TRULICITY ) 1.5 MG/0.5ML SOAJ Inject 1.5 mg into the skin once a week.    [DISCONTINUED] triamcinolone  (KENALOG ) 0.025 % cream Apply 1 Application topically 2 (two) times daily as needed (itching). To groin area    triamcinolone  (KENALOG ) 0.025 % cream Apply 1 Application topically 2 (two) times daily as needed (itching). To groin area    No facility-administered encounter medications on file as of 09/03/2023.    Surgical History: Past Surgical History:  Procedure Laterality Date   AMPUTATION TOE Right 02/26/2023   Procedure: SECOND DIGIT AMPUTATION;  Surgeon: Floyce Hutching, DPM;  Location: [facility] ORS;  Service: Orthopedics/Podiatry;  Laterality: Right;   ANTERIOR LATERAL LUMBAR FUSION WITH  PERCUTANEOUS SCREW 2 LEVEL N/A 07/03/2022   Procedure: L3-5 LATERAL LUMBAR INTERBODY FUSION WITH POSTERIOR SPINAL FUSION;  Surgeon: Jodeen Munch, MD;  Location: [facility] OR[facility];  Service: Neurosurgery;  Laterality: N/A;   APPLICATION OF INTRAOPERATIVE CT SCAN N/A 07/03/2022   Procedure: APPLICATION OF INTRAOPERATIVE CT SCAN;  Surgeon: Jodeen Munch, MD;  Location: [facility] ORS;  Service: Neurosurgery;  Laterality: N/A;   BILATERAL SALPINGECTOMY     BREAST BIOPSY Right 2014   neg- core   BREAST BIOPSY Right 12/21/2018   US  bx, venus clip,   DUCTAL CARCINOMA IN SITU   BREAST BIOPSY Right 12/21/2018   US  bx, ribbon clip,  FIBROEPITHELIAL PROLIFERATION WITH SCLEROSIS   BREAST BIOPSY Right 01/01/2019   Affirm bx "X" clip-path pending   BREAST DUCTAL SYSTEM EXCISION     BREAST EXCISIONAL BIOPSY Right 2014   neg   BREAST LUMPECTOMY Right 01/22/2019   path pending   CATARACT EXTRACTION     CHOLECYSTECTOMY     COLONOSCOPY WITH PROPOFOL  N/A 06/25/2023   Procedure: COLONOSCOPY WITH PROPOFOL ;  Surgeon: Luke Salaam, MD;  Location: [facility]COPY;  Service: Gastroenterology;  Laterality: N/A;   CYSTOSCOPY WITH STENT PLACEMENT Left 04/14/2015   Procedure: CYSTOSCOPY WITH STENT PLACEMENT;  Surgeon: Dustin Gimenez, MD;  Location: [facility];  Service: Urology;  Laterality: Left;   EXTRACORPOREAL SHOCK WAVE LITHOTRIPSY Left 09/29/2014   Procedure: EXTRACORPOREAL SHOCK WAVE LITHOTRIPSY (ESWL);  Surgeon: Dustin Gimenez, MD;  Location: [facility];  Service: Urology;  Laterality: Left;   EYE SURGERY     bilateral cataract   HEMOSTASIS CLIP PLACEMENT  06/25/2023   Procedure: CONTROL OF HEMORRHAGE, GI TRACT, ENDOSCOPIC, BY CLIPPING OR OVERSEWING;  Surgeon: Luke Salaam, MD;  Location[facility];  Service: Gastroenterology;;   IRRIGATION AND DEBRIDEMENT ABSCESS Right 03/24/2019   Procedure: IRRIGATION AND DEBRIDEMENT ABSCESS RIGHT BREAST;  Surgeon: Eldred Grego, MD;  Location: [facility] OR[facility];  Service: General;  Laterality: Right;   LAPAROSCOPIC OOPHERECTOMY Left    PARTIAL MASTECTOMY WITH NEEDLE LOCALIZATION Right 01/22/2019   Procedure: PARTIAL MASTECTOMY WITH NEEDLE LOCALIZATION;  Surgeon: Eldred Grego, MD;  Location: [facility] OR[facility];  Service: General;  Laterality: Right;   POLYPECTOMY  06/25/2023   Procedure: POLYPECTOMY;  Surgeon: Luke Salaam, MD;  Location: [facility] ENDOSCOPY;  Service: Gastroenterology;;   TONSILLECTOMY     TUBAL LIGATION     URETEROSCOPY WITH HOLMIUM LASER LITHOTRIPSY Left 04/14/2015   Procedure: URETEROSCOPY WITH HOLMIUM LASER  LITHOTRIPSY;  Surgeon: Dustin Gimenez, MD;  Location: [facility] OR[facility];  Service: Urology;  Laterality: Left;    Medical History: Past Medical History:  Diagnosis Date   Anemia    Arthropathy    Benign neoplasm of breast    Breast cancer (HCC)    DCIS   Chest pain    CKD (chronic kidney disease), stage IV (HCC)    Diabetes (HCC)    Diabetic retinopathy (HCC)    Dyspnea    Edema    History of kidney stones    Hyperlipemia    Hypersomnia with sleep apnea    Hypertension    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HCC)    Lumbago    Neuropathy    Osteoarthrosis    Osteoporosis    Ovarian failure    Personal history of radiation therapy    Pneumonia due to COVID-19 virus 12/21/2021   a.) required inpatient admission   SOB (shortness of breath)     Family History: Family History  Problem Relation Age of Onset   Breast cancer  Maternal Grandmother 71   Colon cancer Mother    Lung cancer Father     Social History   Socioeconomic History   Marital status: Married    Spouse name: Not on file   Number of children: Not on file   Years of education: Not on file   Highest education level: Not on file  Occupational History   Not on file  Tobacco Use   Smoking status: Former    Current packs/day: 0.00    Average packs/day: 1 pack/day for 30.0 years (30.0 ttl pk-yrs)    Types: Cigarettes    Start date: 04/10/1965    Quit date: 04/11/1995    Years since quitting: 28.4   Smokeless tobacco: Never  Vaping Use   Vaping status: Never Used  Substance and Sexual Activity   Alcohol use: No    Alcohol/week: 0.0 standard drinks of alcohol   Drug use: No   Sexual activity: Not on file  Other Topics Concern   Not on file  Social History Narrative   Not on file   Social Drivers of Health   Financial Resource Strain: Low Risk  (10/03/2020)   Overall Financial Resource Strain (CARDIA)    Difficulty of Paying Living Expenses: Not very hard  Food Insecurity: No Food Insecurity (02/24/2023)    Hunger Vital Sign    Worried About Running Out of Food in the Last Year: Never true    Ran Out of Food in the Last Year: Never true  Transportation Needs: No Transportation Needs (02/24/2023)   PRAPARE - Administrator, Civil Service (Medical): No    Lack of Transportation (Non-Medical): No  Physical Activity: Not on file  Stress: Not on file  Social Connections: Not on file  Intimate Partner Violence: Not At Risk (02/24/2023)   Humiliation, Afraid, Rape, and Kick questionnaire    Fear of Current or Ex-Partner: No    Emotionally Abused: No    Physically Abused: No    Sexually Abused: No      Review of Systems  Constitutional:  Positive for fatigue. Negative for chills and unexpected weight change.  HENT:  Negative for congestion, rhinorrhea, sneezing and sore throat.   Eyes:  Negative for redness.  Respiratory: Negative.  Negative for cough, chest tightness, shortness of breath and wheezing.   Cardiovascular: Negative.  Negative for chest pain and palpitations.  Gastrointestinal: Negative.  Negative for abdominal pain, constipation, diarrhea, nausea and vomiting.  Genitourinary:  Negative for dysuria and frequency.  Musculoskeletal:  Positive for arthralgias and gait problem (using cane). Negative for back pain, joint swelling and neck pain.  Skin:  Negative for rash.  Neurological:  Negative for tremors and numbness.  Hematological:  Negative for adenopathy. Does not bruise/bleed easily.  Psychiatric/Behavioral:  Negative for behavioral problems (Depression), sleep disturbance and suicidal ideas. The patient is not nervous/anxious.     Vital Signs: BP (!) 167/64   Pulse 86   Temp 98 F (36.7 C)   Resp 16   Ht 5' (1.524 m)   Wt 189 lb (85.7 kg)   SpO2 99%   BMI 36.91 kg/m    Physical Exam Vitals reviewed.  Constitutional:      General: She is not in acute distress.    Appearance: Normal appearance. She is obese. She is not ill-appearing.  HENT:      Head: Normocephalic and atraumatic.  Eyes:     Pupils: Pupils are equal, round, and reactive to light.  Cardiovascular:  Rate and Rhythm: Normal rate and regular rhythm.  Pulmonary:     Effort: Pulmonary effort is normal. No respiratory distress.  Neurological:     Mental Status: She is alert and oriented to person, place, and time.  Psychiatric:        Mood and Affect: Mood normal.        Behavior: Behavior normal.        Assessment/Plan: 1. Type 2 diabetes mellitus with chronic kidney disease, with long-term current use of insulin , unspecified CKD stage (HCC) (Primary) Ozempic  dose increased. Routine labs ordered. Getting ozempic  via patient assistance program.  - Semaglutide , 1 MG/DOSE, 4 MG/3ML SOPN; Inject 1 mg as directed once a week.  Dispense: 3 mL; Refill: 5 - CBC with Differential/Platelet - CMP14+EGFR - Lipid Profile - Hgb A1C w/o eAG  2. Hypertension associated with diabetes (HCC) Continue torsemide  as prescribed. Routine labs ordered  - CBC with Differential/Platelet - CMP14+EGFR - Lipid Profile - Hgb A1C w/o eAG - Vitamin D  (25 hydroxy) - B12 and Folate Panel  3. Hyperlipidemia associated with type 2 diabetes mellitus (HCC) Continue rosuvastatin  as prescribed. Routine labs ordered  - CBC with Differential/Platelet - CMP14+EGFR - Lipid Profile  4. Diabetic polyneuropathy associated with type 2 diabetes mellitus (HCC) Routine labs ordered. Continue pregabalin  as prescribed.  - CBC with Differential/Platelet - CMP14+EGFR - Lipid Profile - Hgb A1C w/o eAG  5. B12 deficiency Routine labs ordered  - CBC with Differential/Platelet - B12 and Folate Panel  6. Vitamin D  deficiency Routine lab ordered  - Vitamin D  (25 hydroxy)  7. Atopic dermatitis, unspecified type Triamcinolone  prescribed - triamcinolone  (KENALOG ) 0.025 % cream; Apply 1 Application topically 2 (two) times daily as needed (itching). To groin area  Dispense: 80 g; Refill:  5   General Counseling: Breeanne verbalizes understanding of the findings of todays visit and agrees with plan of treatment. I have discussed any further diagnostic evaluation that may be needed or ordered today. We also reviewed her medications today. she has been encouraged to call the office with any questions or concerns that should arise related to todays visit.    Orders Placed This Encounter  Procedures   CBC with Differential/Platelet   CMP14+EGFR   Lipid Profile   Hgb A1C w/o eAG   Vitamin D  (25 hydroxy)   B12 and Folate Panel    Meds ordered this encounter  Medications   Semaglutide , 1 MG/DOSE, 4 MG/3ML SOPN    Sig: Inject 1 mg as directed once a week.    Dispense:  3 mL    Refill:  5    Please send prior auth request for this medication asap. Dx code E11.65. discontinue trulicity .   triamcinolone  (KENALOG ) 0.025 % cream    Sig: Apply 1 Application topically 2 (two) times daily as needed (itching). To groin area    Dispense:  80 g    Refill:  5    Return for previously scheduled, AWV, Jashawna Reever PCP in july.   Total time spent:30 Minutes Time spent includes review of chart, medications, test results, and follow up plan with the patient.   [place] Controlled Substance Database was reviewed by me.  This patient was seen by Laurence Pons, FNP-C in collaboration with Dr. Verneta Gone as a part of collaborative care agreement.   Sheketa Ende R. Bobbi Burow, MSN, FNP-C Internal medicine

## 2023-09-04 NOTE — Telephone Encounter (Signed)
 Sent message

## 2023-09-10 ENCOUNTER — Encounter: Payer: Self-pay | Admitting: Nurse Practitioner

## 2023-09-12 ENCOUNTER — Encounter: Payer: Self-pay | Admitting: Podiatry

## 2023-09-12 ENCOUNTER — Ambulatory Visit (INDEPENDENT_AMBULATORY_CARE_PROVIDER_SITE_OTHER): Admitting: Podiatry

## 2023-09-12 DIAGNOSIS — Z89421 Acquired absence of other right toe(s): Secondary | ICD-10-CM

## 2023-09-12 DIAGNOSIS — B351 Tinea unguium: Secondary | ICD-10-CM

## 2023-09-12 DIAGNOSIS — L209 Atopic dermatitis, unspecified: Secondary | ICD-10-CM | POA: Diagnosis not present

## 2023-09-12 DIAGNOSIS — E1140 Type 2 diabetes mellitus with diabetic neuropathy, unspecified: Secondary | ICD-10-CM | POA: Diagnosis not present

## 2023-09-12 DIAGNOSIS — M79674 Pain in right toe(s): Secondary | ICD-10-CM

## 2023-09-12 DIAGNOSIS — M79675 Pain in left toe(s): Secondary | ICD-10-CM | POA: Diagnosis not present

## 2023-09-12 DIAGNOSIS — E559 Vitamin D deficiency, unspecified: Secondary | ICD-10-CM | POA: Diagnosis not present

## 2023-09-12 DIAGNOSIS — I10 Essential (primary) hypertension: Secondary | ICD-10-CM | POA: Diagnosis not present

## 2023-09-12 DIAGNOSIS — Z794 Long term (current) use of insulin: Secondary | ICD-10-CM | POA: Diagnosis not present

## 2023-09-12 DIAGNOSIS — E1122 Type 2 diabetes mellitus with diabetic chronic kidney disease: Secondary | ICD-10-CM | POA: Diagnosis not present

## 2023-09-12 DIAGNOSIS — E1142 Type 2 diabetes mellitus with diabetic polyneuropathy: Secondary | ICD-10-CM | POA: Diagnosis not present

## 2023-09-12 DIAGNOSIS — E1169 Type 2 diabetes mellitus with other specified complication: Secondary | ICD-10-CM | POA: Diagnosis not present

## 2023-09-13 LAB — CMP14+EGFR
ALT: 16 IU/L (ref 0–32)
AST: 21 IU/L (ref 0–40)
Albumin: 3.5 g/dL — ABNORMAL LOW (ref 3.8–4.8)
Alkaline Phosphatase: 107 IU/L (ref 44–121)
BUN/Creatinine Ratio: 23 (ref 12–28)
BUN: 36 mg/dL — ABNORMAL HIGH (ref 8–27)
Bilirubin Total: 0.3 mg/dL (ref 0.0–1.2)
CO2: 25 mmol/L (ref 20–29)
Calcium: 9.4 mg/dL (ref 8.7–10.3)
Chloride: 99 mmol/L (ref 96–106)
Creatinine, Ser: 1.55 mg/dL — ABNORMAL HIGH (ref 0.57–1.00)
Globulin, Total: 3.5 g/dL (ref 1.5–4.5)
Glucose: 112 mg/dL — ABNORMAL HIGH (ref 70–99)
Potassium: 4 mmol/L (ref 3.5–5.2)
Sodium: 142 mmol/L (ref 134–144)
Total Protein: 7 g/dL (ref 6.0–8.5)
eGFR: 34 mL/min/{1.73_m2} — ABNORMAL LOW (ref >59)

## 2023-09-13 LAB — CBC WITH DIFFERENTIAL/PLATELET
Basophils Absolute: 0 10*3/uL (ref 0.0–0.2)
Basos: 0 %
EOS (ABSOLUTE): 0.2 10*3/uL (ref 0.0–0.4)
Eos: 3 %
Hematocrit: 34.5 % (ref 34.0–46.6)
Hemoglobin: 10.6 g/dL — ABNORMAL LOW (ref 11.1–15.9)
Immature Grans (Abs): 0 10*3/uL (ref 0.0–0.1)
Immature Granulocytes: 0 %
Lymphocytes Absolute: 1.7 10*3/uL (ref 0.7–3.1)
Lymphs: 26 %
MCH: 27 pg (ref 26.6–33.0)
MCHC: 30.7 g/dL — ABNORMAL LOW (ref 31.5–35.7)
MCV: 88 fL (ref 79–97)
Monocytes Absolute: 0.8 10*3/uL (ref 0.1–0.9)
Monocytes: 11 %
Neutrophils Absolute: 4 10*3/uL (ref 1.4–7.0)
Neutrophils: 60 %
Platelets: 180 10*3/uL (ref 150–450)
RBC: 3.93 x10E6/uL (ref 3.77–5.28)
RDW: 15.7 % — ABNORMAL HIGH (ref 11.7–15.4)
WBC: 6.7 10*3/uL (ref 3.4–10.8)

## 2023-09-13 LAB — HGB A1C W/O EAG: Hgb A1c MFr Bld: 8.2 % — ABNORMAL HIGH (ref 4.8–5.6)

## 2023-09-13 LAB — LIPID PANEL
Chol/HDL Ratio: 2.7 ratio (ref 0.0–4.4)
Cholesterol, Total: 132 mg/dL (ref 100–199)
HDL: 49 mg/dL (ref >39)
LDL Chol Calc (NIH): 52 mg/dL (ref 0–99)
Triglycerides: 190 mg/dL — ABNORMAL HIGH (ref 0–149)
VLDL Cholesterol Cal: 31 mg/dL (ref 5–40)

## 2023-09-13 LAB — B12 AND FOLATE PANEL
Folate: >20 ng/mL (ref >3.0)
Vitamin B-12: 638 pg/mL (ref 232–1245)

## 2023-09-13 LAB — VITAMIN D 25 HYDROXY (VIT D DEFICIENCY, FRACTURES): Vit D, 25-Hydroxy: 33.5 ng/mL (ref 30.0–100.0)

## 2023-09-16 NOTE — Progress Notes (Signed)
  Subjective:  Patient ID: Kara [name], female    DOB: [birth year],  MRN: [id number]  Kara [name] presents to clinic today for at risk foot care. Patient has h/o diabetes with history of ulceration of R 2nd toe and painful, discolored, thick toenails which interfere with daily activities  Chief Complaint  Patient presents with   Nail Problem    DFC   New problem(s): None.   PCP is Laurence Pons, NP. Kara [name] 08/06/2023.  Allergies  Allergen Reactions   Amoxicillin  Nausea And Vomiting    Review of Systems: Negative except as noted in the HPI.  Objective: No changes noted in today's physical examination. There were no vitals filed for this visit. Kara [name] is a pleasant 78 y.o. female in NAD. AAO x 3.  Vascular Examination: Capillary refill time immediate b/l. Vascular status intact b/l with palpable pedal pulses. Pedal hair present b/l. No pain with calf compression b/l. Skin temperature gradient WNL b/l. No cyanosis or clubbing b/l. No ischemia or gangrene noted b/l. Trace edema noted BLE.  Neurological Examination: Sensation grossly intact b/l with 10 gram monofilament. Vibratory sensation intact b/l.   Dermatological Examination: Well healed surgical scar at amputation site of right 2nd toe. Pedal skin with normal turgor, texture and tone b/l.  No open wounds. No interdigital macerations.   Toenails 1-5 left, 1, 3-5 right thick, discolored, elongated with subungual debris and pain on dorsal palpation.   Minimal hyperkeratos(is/es) noted distal tip of left 2nd toe.  Musculoskeletal Examination: Muscle strength 5/5 to all lower extremity muscle groups bilaterally. Lower extremity amputation(s): digital amputation R 2nd toe.  Radiographs: None  Assessment/Plan: 1. Pain due to onychomycosis of toenails of both feet   2. Status post amputation of lesser toe of right foot (HCC)   3. Type 2 diabetes mellitus with diabetic neuropathy, with long-term current use of  insulin  [facility])     -Patient was evaluated today. All questions/concerns addressed on today's visit. -Continue foot and shoe inspections daily. Monitor blood glucose per PCP/Endocrinologist's recommendations. -Patient to continue soft, supportive shoe gear daily. -Mycotic toenails 1-5 left foot, 3-5 right foot, and R hallux were debrided in length and girth with sterile nail nippers and dremel without iatrogenic bleeding. -Patient/POA to call should there be question/concern in the interim.   Return in about 3 months (around 12/13/2023).  Kara [name], DPM      [facility] LOCATION: [address] [place] [address]                   Office [phone]   [facility] LOCATION: [address] [place], [place] [address] Office [phone]

## 2023-09-17 ENCOUNTER — Telehealth: Payer: Self-pay

## 2023-09-17 NOTE — Telephone Encounter (Signed)
 Pt is coming tomorrow to sign for PA for ozempic  paperwork at front desk

## 2023-09-20 ENCOUNTER — Encounter: Payer: Self-pay | Admitting: Nurse Practitioner

## 2023-10-01 ENCOUNTER — Other Ambulatory Visit: Payer: Self-pay | Admitting: Nurse Practitioner

## 2023-10-01 DIAGNOSIS — M4800 Spinal stenosis, site unspecified: Secondary | ICD-10-CM

## 2023-10-01 DIAGNOSIS — G834 Cauda equina syndrome: Secondary | ICD-10-CM

## 2023-10-01 DIAGNOSIS — E1142 Type 2 diabetes mellitus with diabetic polyneuropathy: Secondary | ICD-10-CM

## 2023-10-02 ENCOUNTER — Telehealth: Payer: Self-pay

## 2023-10-02 ENCOUNTER — Other Ambulatory Visit: Payer: Self-pay

## 2023-10-02 NOTE — Telephone Encounter (Signed)
 Called patient to let her know that her ozempic  is here for pick up.

## 2023-10-02 NOTE — Telephone Encounter (Signed)
 Please review and send

## 2023-10-11 ENCOUNTER — Other Ambulatory Visit: Payer: Self-pay | Admitting: Dermatology

## 2023-10-11 DIAGNOSIS — Z79899 Other long term (current) drug therapy: Secondary | ICD-10-CM

## 2023-10-21 ENCOUNTER — Ambulatory Visit: Payer: Medicare HMO | Admitting: Nurse Practitioner

## 2023-10-21 ENCOUNTER — Encounter: Payer: Self-pay | Admitting: Nurse Practitioner

## 2023-10-21 VITALS — BP 138/70 | HR 77 | Temp 98.2°F | Resp 16 | Ht 60.0 in | Wt 188.2 lb

## 2023-10-21 DIAGNOSIS — E1142 Type 2 diabetes mellitus with diabetic polyneuropathy: Secondary | ICD-10-CM

## 2023-10-21 DIAGNOSIS — E785 Hyperlipidemia, unspecified: Secondary | ICD-10-CM

## 2023-10-21 DIAGNOSIS — E1169 Type 2 diabetes mellitus with other specified complication: Secondary | ICD-10-CM

## 2023-10-21 DIAGNOSIS — G834 Cauda equina syndrome: Secondary | ICD-10-CM

## 2023-10-21 DIAGNOSIS — E1159 Type 2 diabetes mellitus with other circulatory complications: Secondary | ICD-10-CM | POA: Diagnosis not present

## 2023-10-21 DIAGNOSIS — E1122 Type 2 diabetes mellitus with diabetic chronic kidney disease: Secondary | ICD-10-CM

## 2023-10-21 DIAGNOSIS — Z0000 Encounter for general adult medical examination without abnormal findings: Secondary | ICD-10-CM | POA: Diagnosis not present

## 2023-10-21 DIAGNOSIS — Z794 Long term (current) use of insulin: Secondary | ICD-10-CM

## 2023-10-21 DIAGNOSIS — M4800 Spinal stenosis, site unspecified: Secondary | ICD-10-CM

## 2023-10-21 DIAGNOSIS — I152 Hypertension secondary to endocrine disorders: Secondary | ICD-10-CM

## 2023-10-21 DIAGNOSIS — N1832 Chronic kidney disease, stage 3b: Secondary | ICD-10-CM

## 2023-10-21 MED ORDER — EMPAGLIFLOZIN 25 MG PO TABS: 25.0000 mg | ORAL_TABLET | Freq: Every day | ORAL | 5 refills | Status: DC

## 2023-10-21 MED ORDER — PREGABALIN 100 MG PO CAPS: 100.0000 mg | ORAL_CAPSULE | Freq: Two times a day (BID) | ORAL | 0 refills | Status: DC

## 2023-10-21 MED ORDER — FLUCONAZOLE 150 MG PO TABS: 150.0000 mg | ORAL_TABLET | Freq: Once | ORAL | 0 refills | Status: AC

## 2023-10-21 NOTE — Progress Notes (Signed)
 [facility] [address] [place], [place] [address]  Internal MEDICINE  Office Visit Note  Patient Name: Kara [name]  [id number]  [id number]  Date of Service: 10/21/2023  Chief Complaint  Patient presents with   Diabetes   Hypertension   Hyperlipidemia   Medicare Wellness    HPI Aydin presents for a medicare annual wellness visit.  Well-appearing 78 y.o. female with hypertension, aortic atherosclerosis, GERD, IBS, diabetes, neuropathy, hyperparathyroidism, chronic low back pain, osteoarthritis of multiple joints, CKD stage 3, diverticulosis, anemia, high cholesterol, and history of breast cancer  Routine CRC screening: due in 2028 Routine mammogram: due in October, monitored by oncology.  DEXA scan: done in 2018 Eye exam: has been to eye doctor this year  foot exam: done  Labs: lab results discussed with patient.  A1c is elevated at 8.2, this was before starting ozempic  Triglycerides are elevated but improving Creatinine is elevated but slightly improved, eGFR is 34.  Hemoglobin is improved by still low at 10.6, RBC and hematocrit are normal.  New or worsening pain: feet and back hurting.  Other concerns: none       10/21/2023    8:55 AM 10/17/2022   10:43 AM 10/11/2021    9:49 AM  MMSE - Mini Mental State Exam  Orientation to time 5 5 5   Orientation to Place 5 5 5   Registration 3 3 3   Attention/ Calculation 5 5 5   Recall 3 3 3   Language- name 2 objects 2 2 2   Language- repeat 1 1 1   Language- follow 3 step command 3 3 3   Language- read & follow direction 1 1 1   Write a sentence 1 1 1   Copy design 1 1 1   Total score 30 30 30     Functional Status Survey: Is the patient deaf or have difficulty hearing?: No Does the patient have difficulty seeing, even when wearing glasses/contacts?: Yes Does the patient have difficulty concentrating, remembering, or making decisions?: No Does the patient have difficulty walking or climbing stairs?: Yes Does the  patient have difficulty dressing or bathing?: No Does the patient have difficulty doing errands alone such as visiting a doctor's office or shopping?: Yes     04/23/2022   11:13 AM 05/03/2022    9:24 AM 05/07/2022   12:29 PM 10/17/2022   10:42 AM 10/21/2023    8:54 AM  Fall Risk  Falls in the past year? 0 0 0 0 0  Was there an injury with Fall?  0  0 0  Fall Risk Category Calculator  0  0 0  Fall Risk Category (Retired)  Low      (RETIRED) Patient Fall Risk Level  Low fall risk      Patient at Risk for Falls Due to  No Fall Risks  No Fall Risks No Fall Risks  Fall risk Follow up  Falls evaluation completed   Falls evaluation completed Falls evaluation completed     Data saved with a previous flowsheet row definition       10/21/2023    8:54 AM  Depression screen PHQ 2/9  Decreased Interest 0  Down, Depressed, Hopeless 0  PHQ - 2 Score 0       Current Medication: Outpatient Encounter Medications as of 10/21/2023  Medication Sig Note   empagliflozin  (JARDIANCE ) 25 MG TABS tablet Take 1 tablet (25 mg total) by mouth daily before breakfast.    fluconazole  (DIFLUCAN ) 150 MG tablet Take 1 tablet (150 mg total) by  mouth once for 1 dose. May take an additional dose after 3 days if still symptomatic.    acetaminophen  (TYLENOL ) 325 MG tablet Take 2 tablets (650 mg total) by mouth 3 (three) times daily.    Aflibercept  (EYLEA ) 2 MG/0.05ML SOLN  12/22/2021: Every 3 months   Calcium -Magnesium -Vitamin D  (CALCIUM  1200+D3 PO) Take 1 tablet by mouth daily.    Collagen-Boron-Hyaluronic Acid (MOVE FREE ULTRA JOINT HEALTH) 40-5-3.3 MG TABS Take 1 tablet by mouth daily.    doxycycline  (MONODOX ) 100 MG capsule TAKE 1 CAPSULE BY MOUTH TWICE DAILY WITH FOOD    Ferrous Gluconate  324 (37.5 Fe) MG TABS Take 324 mg by mouth daily.    gentamicin  cream (GARAMYCIN ) 0.1 % Apply to ulcers once daily.    hydrocortisone  2.5 % cream APPLY TO AFFECTED AREAS BUTTOCKS ONCE TO TWICE DAILY UNTIL IMPROVED.    insulin  aspart  (NOVOLOG  FLEXPEN) 100 UNIT/ML FlexPen INJECT [place] 3 times daily with meals AS DIRECTED PER SLIDING SCALE. MAX DAILY DOSE IS 30 UNITS. DX E11.65.    insulin  glargine (LANTUS ) 100 UNIT/ML Solostar Pen Inject 40 Units into the skin daily.    ketoconazole  (NIZORAL ) 2 % cream Apply to affected area buttocks twice daily until improved.    Krill Oil 500 MG CAPS Take 1 capsule by mouth daily.    Lactobacillus (PROBIOTIC ACIDOPHILUS) CAPS Take 1 capsule by mouth daily.    Multiple Vitamin (MULTIVITAMIN) tablet Take 1 tablet by mouth daily at 12 noon.    pantoprazole  (PROTONIX ) 40 MG tablet Take 1 tablet (40 mg total) by mouth daily.    pregabalin  (LYRICA ) 100 MG capsule Take 1 capsule (100 mg total) by mouth 2 (two) times daily.    rosuvastatin  (CRESTOR ) 20 MG tablet Take 1 tablet (20 mg total) by mouth daily. TAKE 1 TABLET BY MOUTH EVERYDAY AT BEDTIME    Semaglutide , 1 MG/DOSE, 4 MG/3ML SOPN Inject 1 mg as directed once a week.    tamoxifen  (NOLVADEX ) 20 MG tablet Take 1 tablet (20 mg total) by mouth daily.    torsemide  (DEMADEX ) 20 MG tablet Take 1 tablet (20 mg total) by mouth 2 (two) times daily.    triamcinolone  (KENALOG ) 0.025 % cream Apply 1 Application topically 2 (two) times daily as needed (itching). To groin area    [DISCONTINUED] pregabalin  (LYRICA ) 100 MG capsule TAKE 1 CAPSULE(100 MG) BY MOUTH TWICE DAILY    No facility-administered encounter medications on file as of 10/21/2023.    Surgical History: Past Surgical History:  Procedure Laterality Date   AMPUTATION TOE Right 02/26/2023   Procedure: SECOND DIGIT AMPUTATION;  Surgeon: Silva Juliene SAUNDERS, DPM;  Location: [facility] OR[facility];  Service: Orthopedics/Podiatry;  Laterality: Right;   ANTERIOR LATERAL LUMBAR FUSION WITH PERCUTANEOUS SCREW 2 LEVEL N/A 07/03/2022   Procedure: L3-5 LATERAL LUMBAR INTERBODY FUSION WITH POSTERIOR SPINAL FUSION;  Surgeon: Clois Fret, MD;  Location: [facility];  Service: Neurosurgery;  Laterality: N/A;   APPLICATION OF  INTRAOPERATIVE CT SCAN N/A 07/03/2022   Procedure: APPLICATION OF INTRAOPERATIVE CT SCAN;  Surgeon: Clois Fret, MD;  Location: [facility];  Service: Neurosurgery;  Laterality: N/A;   BILATERAL SALPINGECTOMY     BREAST BIOPSY Right 2014   neg- core   BREAST BIOPSY Right 12/21/2018   US  bx, venus clip,  DUCTAL CARCINOMA IN SITU   BREAST BIOPSY Right 12/21/2018   US  bx, ribbon clip,  FIBROEPITHELIAL PROLIFERATION WITH SCLEROSIS   BREAST BIOPSY Right 01/01/2019   Affirm bx X clip-path pending   BREAST DUCTAL  SYSTEM EXCISION     BREAST EXCISIONAL BIOPSY Right 2014   neg   BREAST LUMPECTOMY Right 01/22/2019   path pending   CATARACT EXTRACTION     CHOLECYSTECTOMY     COLONOSCOPY WITH PROPOFOL  N/A 06/25/2023   Procedure: COLONOSCOPY WITH PROPOFOL ;  Surgeon: Therisa Bi, MD;  Location: [facility]COPY;  Service: Gastroenterology;  Laterality: N/A;   CYSTOSCOPY WITH STENT PLACEMENT Left 04/14/2015   Procedure: CYSTOSCOPY WITH STENT PLACEMENT;  Surgeon: Rosina Riis, MD;  Location: [facility];  Service: Urology;  Laterality: Left;   EXTRACORPOREAL SHOCK WAVE LITHOTRIPSY Left 09/29/2014   Procedure: EXTRACORPOREAL SHOCK WAVE LITHOTRIPSY (ESWL);  Surgeon: Rosina Riis, MD;  Location: [facility];  Service: Urology;  Laterality: Left;   EYE SURGERY     bilateral cataract   HEMOSTASIS CLIP PLACEMENT  06/25/2023   Procedure: CONTROL OF HEMORRHAGE, GI TRACT, ENDOSCOPIC, BY CLIPPING OR OVERSEWING;  Surgeon: Therisa Bi, MD;  Location: [facility] ENDOSCOPY;  Service: Gastroenterology;;   IRRIGATION AND DEBRIDEMENT ABSCESS Right 03/24/2019   Procedure: IRRIGATION AND DEBRIDEMENT ABSCESS RIGHT BREAST;  Surgeon: Rodolph Romano, MD;  Location: [facility];  Service: General;  Laterality: Right;   LAPAROSCOPIC OOPHERECTOMY Left    PARTIAL MASTECTOMY WITH NEEDLE LOCALIZATION Right 01/22/2019   Procedure: PARTIAL MASTECTOMY WITH NEEDLE LOCALIZATION;  Surgeon: Rodolph Romano, MD;  Location: [facility];   Service: General;  Laterality: Right;   POLYPECTOMY  06/25/2023   Procedure: POLYPECTOMY;  Surgeon: Therisa Bi, MD;  Location: [facility] ENDOSCOPY;  Service: Gastroenterology;;   TONSILLECTOMY     TUBAL LIGATION     URETEROSCOPY WITH HOLMIUM LASER LITHOTRIPSY Left 04/14/2015   Procedure: URETEROSCOPY WITH HOLMIUM LASER LITHOTRIPSY;  Surgeon: Rosina Riis, MD;  Location: [facility];  Service: Urology;  Laterality: Left;    Medical History: Past Medical History:  Diagnosis Date   Anemia    Arthropathy    Benign neoplasm of breast    Breast cancer (HCC)    DCIS   Chest pain    CKD (chronic kidney disease), stage IV (HCC)    Diabetes (HCC)    Diabetic retinopathy (HCC)    Dyspnea    Edema    History of kidney stones    Hyperlipemia    Hypersomnia with sleep apnea    Hypertension    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HCC)    Lumbago    Neuropathy    Osteoarthrosis    Osteoporosis    Ovarian failure    Personal history of radiation therapy    Pneumonia due to COVID-19 virus 12/21/2021   a.) required inpatient admission   SOB (shortness of breath)     Family History: Family History  Problem Relation Age of Onset   Breast cancer Maternal Grandmother 30   Colon cancer Mother    Lung cancer Father     Social History   Socioeconomic History   Marital status: Married    Spouse name: Not on file   Number of children: Not on file   Years of education: Not on file   Highest education level: Not on file  Occupational History   Not on file  Tobacco Use   Smoking status: Former    Current packs/day: 0.00    Average packs/day: 1 pack/day for 30.0 years (30.0 ttl pk-yrs)    Types: Cigarettes    Start date: 04/10/1965    Quit date: 04/11/1995    Years since quitting: 28.5   Smokeless tobacco: Never  Vaping Use   Vaping status: Never  Used  Substance and Sexual Activity   Alcohol use: No    Alcohol/week: 0.0 standard drinks of alcohol   Drug use: No   Sexual activity: Not  on file  Other Topics Concern   Not on file  Social History Narrative   Not on file   Social Drivers of Health   Financial Resource Strain: Low Risk  (10/03/2020)   Overall Financial Resource Strain (CARDIA)    Difficulty of Paying Living Expenses: Not very hard  Food Insecurity: No Food Insecurity (02/24/2023)   Hunger Vital Sign    Worried About Running Out of Food in the Last Year: Never true    Ran Out of Food in the Last Year: Never true  Transportation Needs: No Transportation Needs (02/24/2023)   PRAPARE - Administrator, Civil Service (Medical): No    Lack of Transportation (Non-Medical): No  Physical Activity: Not on file  Stress: Not on file  Social Connections: Not on file  Intimate Partner Violence: Not At Risk (02/24/2023)   Humiliation, Afraid, Rape, and Kick questionnaire    Fear of Current or Ex-Partner: No    Emotionally Abused: No    Physically Abused: No    Sexually Abused: No      Review of Systems  Constitutional:  Negative for activity change, appetite change, chills, fatigue, fever and unexpected weight change.  HENT: Negative.  Negative for congestion, ear pain, rhinorrhea, sore throat and trouble swallowing.   Eyes: Negative.   Respiratory: Negative.  Negative for cough, chest tightness, shortness of breath and wheezing.   Cardiovascular: Negative.  Negative for chest pain.  Gastrointestinal: Negative.  Negative for abdominal pain, blood in stool, constipation, diarrhea, nausea and vomiting.  Endocrine: Negative.   Genitourinary: Negative.  Negative for difficulty urinating, dysuria, frequency, hematuria and urgency.  Musculoskeletal: Negative.  Negative for arthralgias, back pain, joint swelling, myalgias and neck pain.  Skin: Negative.  Negative for rash and wound.  Allergic/Immunologic: Negative.  Negative for immunocompromised state.  Neurological: Negative.  Negative for dizziness, seizures, numbness and headaches.  Hematological:  Negative.   Psychiatric/Behavioral: Negative.  Negative for behavioral problems, self-injury and suicidal ideas. The patient is not nervous/anxious.     Vital Signs: BP (!) 149/70   Pulse 77   Temp 98.2 F (36.8 C)   Resp 16   Ht 5' (1.524 m)   Wt 188 lb 3.2 oz (85.4 kg)   SpO2 94%   BMI 36.76 kg/m    Physical Exam Vitals reviewed.  Constitutional:      General: She is awake. She is not in acute distress.    Appearance: Normal appearance. She is well-developed and well-groomed. She is obese. She is not ill-appearing or diaphoretic.  HENT:     Head: Normocephalic and atraumatic.     Right Ear: Tympanic membrane, ear canal and external ear normal.     Left Ear: Tympanic membrane, ear canal and external ear normal.     Nose: Nose normal. No congestion or rhinorrhea.     Mouth/Throat:     Lips: Pink.     Mouth: Mucous membranes are moist.     Pharynx: Oropharynx is clear. Uvula midline. No oropharyngeal exudate or posterior oropharyngeal erythema.   Eyes:     General: Lids are normal. Vision grossly intact. Gaze aligned appropriately. No scleral icterus.       Right eye: No discharge.        Left eye: No discharge.  Extraocular Movements: Extraocular movements intact.     Conjunctiva/sclera: Conjunctivae normal.     Pupils: Pupils are equal, round, and reactive to light.     Funduscopic exam:    Right eye: Red reflex present.        Left eye: Red reflex present.  Neck:     Thyroid : No thyromegaly.     Vascular: No JVD.     Trachea: Trachea and phonation normal. No tracheal deviation.   Cardiovascular:     Rate and Rhythm: Normal rate and regular rhythm.     Pulses: Normal pulses.          Dorsalis pedis pulses are 2+ on the right side and 2+ on the left side.       Posterior tibial pulses are 2+ on the right side and 2+ on the left side.     Heart sounds: Normal heart sounds, S1 normal and S2 normal. No murmur heard.    No friction rub. No gallop.  Pulmonary:      Effort: Pulmonary effort is normal. No accessory muscle usage or respiratory distress.     Breath sounds: Normal breath sounds and air entry. No stridor. No wheezing or rales.  Chest:     Chest wall: No tenderness.     Comments: Declined clinical breast exam Abdominal:     General: Bowel sounds are normal. There is no distension.     Palpations: Abdomen is soft. There is no shifting dullness, fluid wave, mass or pulsatile mass.     Tenderness: There is no abdominal tenderness. There is no guarding or rebound.   Musculoskeletal:        General: No tenderness or deformity. Normal range of motion.     Cervical back: Normal range of motion and neck supple.     Right lower leg: No edema.     Left lower leg: No edema.     Right foot: Normal range of motion. No deformity, bunion, Charcot foot, foot drop or prominent metatarsal heads.     Left foot: Normal range of motion. No deformity, bunion, Charcot foot, foot drop or prominent metatarsal heads.  Feet:     Right foot:     Protective Sensation: 6 sites tested.  6 sites sensed.     Skin integrity: Callus and dry skin present. No ulcer, blister, skin breakdown, erythema, warmth or fissure.     Toenail Condition: Right toenails are abnormally thick.     Left foot:     Protective Sensation: 6 sites tested.  6 sites sensed.     Skin integrity: Callus and dry skin present. No ulcer, blister, skin breakdown, erythema, warmth or fissure.     Toenail Condition: Left toenails are abnormally thick.  Lymphadenopathy:     Cervical: No cervical adenopathy.   Skin:    General: Skin is warm and dry.     Capillary Refill: Capillary refill takes less than 2 seconds.     Coloration: Skin is not pale.     Findings: No erythema or rash.   Neurological:     Mental Status: She is alert and oriented to person, place, and time.     Cranial Nerves: No cranial nerve deficit.     Motor: No abnormal muscle tone.     Coordination: Coordination normal.     Deep  Tendon Reflexes: Reflexes are normal and symmetric.   Psychiatric:        Mood and Affect: Mood normal.  Behavior: Behavior normal. Behavior is cooperative.        Thought Content: Thought content normal.        Judgment: Judgment normal.        Assessment/Plan: 1. Encounter for subsequent annual wellness visit (AWV) in Medicare patient (Primary) Age-appropriate preventive screenings and vaccinations discussed. Routine labs for health maintenance results discussed with patient today. PHM updated.    2. Type 2 diabetes mellitus with stage 3b chronic kidney disease, with long-term current use of insulin  (HCC) Continue ozempic  as prescribed. Will start jardiance  as prescribed. Fluconazole  sent just in case  - empagliflozin  (JARDIANCE ) 25 MG TABS tablet; Take 1 tablet (25 mg total) by mouth daily before breakfast.  Dispense: 30 tablet; Refill: 5 - fluconazole  (DIFLUCAN ) 150 MG tablet; Take 1 tablet (150 mg total) by mouth once for 1 dose. May take an additional dose after 3 days if still symptomatic.  Dispense: 3 tablet; Refill: 0  3. Hypertension associated with diabetes (HCC) Stable, continue torsemide  as prescribed.   4. Hyperlipidemia associated with type 2 diabetes mellitus (HCC) Continue rosuvastatin  as prescribed.   5. Diabetic polyneuropathy associated with type 2 diabetes mellitus (HCC) Continue pregabalin  as prescribed. Follow up in 3 months for additional refills.  - pregabalin  (LYRICA ) 100 MG capsule; Take 1 capsule (100 mg total) by mouth 2 (two) times daily.  Dispense: 180 capsule; Refill: 0  6. Central stenosis of spinal canal Continue pregabalin  as prescribed. Follow up in 3 months for additional refills  - pregabalin  (LYRICA ) 100 MG capsule; Take 1 capsule (100 mg total) by mouth 2 (two) times daily.  Dispense: 180 capsule; Refill: 0  7. Cauda equina syndrome (HCC) Continue pregabalin  as prescribed. Follow up in 3 months for additional refills.  - pregabalin   (LYRICA ) 100 MG capsule; Take 1 capsule (100 mg total) by mouth 2 (two) times daily.  Dispense: 180 capsule; Refill: 0     General Counseling: Empress verbalizes understanding of the findings of todays visit and agrees with plan of treatment. I have discussed any further diagnostic evaluation that may be needed or ordered today. We also reviewed her medications today. she has been encouraged to call the office with any questions or concerns that should arise related to todays visit.    Orders Placed This Encounter  Procedures   Urine Microalbumin w/creat. ratio    Meds ordered this encounter  Medications   empagliflozin  (JARDIANCE ) 25 MG TABS tablet    Sig: Take 1 tablet (25 mg total) by mouth daily before breakfast.    Dispense:  30 tablet    Refill:  5    Dx code E11.65   fluconazole  (DIFLUCAN ) 150 MG tablet    Sig: Take 1 tablet (150 mg total) by mouth once for 1 dose. May take an additional dose after 3 days if still symptomatic.    Dispense:  3 tablet    Refill:  0    Do not fill unless patient calls and requests the medication   pregabalin  (LYRICA ) 100 MG capsule    Sig: Take 1 capsule (100 mg total) by mouth 2 (two) times daily.    Dispense:  180 capsule    Refill:  0    For next fill when due, fill for 90 days    Return in about 3 months (around 01/14/2024) for F/U, pain med refill, Shreyansh Tiffany PCP, Recheck A1C lyrica .   Total time spent:30 Minutes Time spent includes review of chart, medications, test results, and follow up plan with  the patient.   [place] Controlled Substance Database was reviewed by me.  This patient was seen by Mardy Maxin, FNP-C in collaboration with Dr. Sigrid Bathe as a part of collaborative care agreement.  Anastacia Reinecke R. Maxin, MSN, FNP-C Internal medicine

## 2023-10-24 ENCOUNTER — Encounter: Payer: Self-pay | Admitting: Nurse Practitioner

## 2023-10-24 ENCOUNTER — Other Ambulatory Visit: Payer: Self-pay | Admitting: Nurse Practitioner

## 2023-10-24 DIAGNOSIS — Z760 Encounter for issue of repeat prescription: Secondary | ICD-10-CM

## 2023-11-17 ENCOUNTER — Ambulatory Visit: Admitting: Podiatry

## 2023-11-17 ENCOUNTER — Encounter: Payer: Self-pay | Admitting: Podiatry

## 2023-11-17 DIAGNOSIS — Z794 Long term (current) use of insulin: Secondary | ICD-10-CM | POA: Diagnosis not present

## 2023-11-17 DIAGNOSIS — L84 Corns and callosities: Secondary | ICD-10-CM | POA: Diagnosis not present

## 2023-11-17 DIAGNOSIS — B351 Tinea unguium: Secondary | ICD-10-CM

## 2023-11-17 DIAGNOSIS — M79675 Pain in left toe(s): Secondary | ICD-10-CM

## 2023-11-17 DIAGNOSIS — Z89421 Acquired absence of other right toe(s): Secondary | ICD-10-CM | POA: Diagnosis not present

## 2023-11-17 DIAGNOSIS — S90426A Blister (nonthermal), unspecified lesser toe(s), initial encounter: Secondary | ICD-10-CM | POA: Diagnosis not present

## 2023-11-17 DIAGNOSIS — M79674 Pain in right toe(s): Secondary | ICD-10-CM | POA: Diagnosis not present

## 2023-11-17 DIAGNOSIS — E1140 Type 2 diabetes mellitus with diabetic neuropathy, unspecified: Secondary | ICD-10-CM | POA: Diagnosis not present

## 2023-11-17 NOTE — Progress Notes (Unsigned)
  Subjective:  Patient ID: Kara [name], female    DOB: 05-12-1945,  MRN: [id number]  Kara [name] presents to clinic today for at risk foot care. Patient has h/o NIDDM with amputation of digital amputation R 2nd toe and callus(es) of both feet and painful thick toenails that are difficult to trim. Painful toenails interfere with ambulation. Aggravating factors include wearing enclosed shoe gear. Pain is relieved with periodic professional debridement. Painful calluses are aggravated when weightbearing with and without shoegear. Pain is relieved with periodic professional debridement.  Chief Complaint  Patient presents with   [facility]    Rm1 Diabetic foot care /A1C 7.8/ Bloodsugar 138/ Dr. Liana last visit July 2025   PCP is Liana Fish, NP.  Allergies  Allergen Reactions   Amoxicillin  Nausea And Vomiting    Review of Systems: Negative except as noted in the HPI.  Objective:  There were no vitals filed for this visit. Kara [name] is a pleasant 78 y.o. female in NAD. AAO x 3.  Vascular Examination: Capillary refill time immediate b/l. Vascular status intact b/l with palpable pedal pulses. Pedal hair present b/l. No pain with calf compression b/l. Skin temperature gradient WNL b/l. No cyanosis or clubbing b/l. No ischemia or gangrene noted b/l. Trace edema noted BLE.  Neurological Examination: Sensation grossly intact b/l with 10 gram monofilament. Vibratory sensation intact b/l.   Dermatological Examination: She has a pressure injury on the right 3rd digit dorsally which is presenting as a hyperkeratotic dried blister. This is from toenail impingement of the right great toenail. No break in skin. There is no cellulitis nor fluctuance noted.   Pedal skin with normal turgor, texture and tone b/l.  No open wounds. No interdigital macerations.   Toenails 1-5 left, 1, 3-5 right thick, discolored, elongated with subungual debris and pain on dorsal palpation.   Minimal  hyperkeratos(is/es) noted distal tip of left 2nd toe.  Musculoskeletal Examination: Muscle strength 5/5 to all lower extremity muscle groups bilaterally. Lower extremity amputation(s): digital amputation R 2nd toe.  Radiographs: None  Assessment/Plan: 1. Pain due to onychomycosis of toenails of both feet   2. Blister of third toe   3. Status post amputation of lesser toe of right foot (HCC)   4. Type 2 diabetes mellitus with diabetic neuropathy, with long-term current use of insulin  (HCC)   5. Callus of foot     No orders of the defined types were placed in this encounter.   None {Jgplan:[id number]::-Patient/POA to call should there be question/concern in the interim.}   Return in about 3 months (around 02/17/2024).  Delon LITTIE Merlin, DPM      [facility] LOCATION: [address], [place] [address]                   Office [phone]   [facility] LOCATION: [address] [place], [place] [address] Office [phone]

## 2023-11-26 ENCOUNTER — Telehealth: Payer: Self-pay | Admitting: Nurse Practitioner

## 2023-11-26 NOTE — Telephone Encounter (Signed)
 Received MR request from [facility]. I s/w patient, she stated she does not do business with this company, and not to send her MR-Kara [name]

## 2023-12-02 ENCOUNTER — Other Ambulatory Visit: Payer: Self-pay | Admitting: Nurse Practitioner

## 2023-12-02 DIAGNOSIS — E1159 Type 2 diabetes mellitus with other circulatory complications: Secondary | ICD-10-CM

## 2023-12-12 DIAGNOSIS — H34832 Tributary (branch) retinal vein occlusion, left eye, with macular edema: Secondary | ICD-10-CM | POA: Diagnosis not present

## 2024-01-10 ENCOUNTER — Other Ambulatory Visit: Payer: Self-pay | Admitting: Dermatology

## 2024-01-10 DIAGNOSIS — Z79899 Other long term (current) drug therapy: Secondary | ICD-10-CM

## 2024-01-15 ENCOUNTER — Ambulatory Visit: Admitting: Nurse Practitioner

## 2024-01-22 ENCOUNTER — Encounter: Payer: Self-pay | Admitting: Nurse Practitioner

## 2024-01-22 ENCOUNTER — Ambulatory Visit: Admitting: Nurse Practitioner

## 2024-01-22 VITALS — BP 135/75 | HR 74 | Temp 96.3°F | Resp 16 | Ht 60.0 in | Wt 182.2 lb

## 2024-01-22 DIAGNOSIS — E1122 Type 2 diabetes mellitus with diabetic chronic kidney disease: Secondary | ICD-10-CM

## 2024-01-22 DIAGNOSIS — M064 Inflammatory polyarthropathy: Secondary | ICD-10-CM

## 2024-01-22 DIAGNOSIS — I5032 Chronic diastolic (congestive) heart failure: Secondary | ICD-10-CM | POA: Diagnosis not present

## 2024-01-22 DIAGNOSIS — N1832 Chronic kidney disease, stage 3b: Secondary | ICD-10-CM | POA: Diagnosis not present

## 2024-01-22 DIAGNOSIS — M4800 Spinal stenosis, site unspecified: Secondary | ICD-10-CM | POA: Diagnosis not present

## 2024-01-22 DIAGNOSIS — H34832 Tributary (branch) retinal vein occlusion, left eye, with macular edema: Secondary | ICD-10-CM

## 2024-01-22 DIAGNOSIS — E1142 Type 2 diabetes mellitus with diabetic polyneuropathy: Secondary | ICD-10-CM

## 2024-01-22 DIAGNOSIS — G834 Cauda equina syndrome: Secondary | ICD-10-CM

## 2024-01-22 DIAGNOSIS — Z794 Long term (current) use of insulin: Secondary | ICD-10-CM | POA: Diagnosis not present

## 2024-01-22 LAB — POCT GLYCOSYLATED HEMOGLOBIN (HGB A1C): Hemoglobin A1C: 7.2 % — AB (ref 4.0–5.6)

## 2024-01-22 MED ORDER — PREGABALIN 100 MG PO CAPS: 100.0000 mg | ORAL_CAPSULE | Freq: Two times a day (BID) | ORAL | 0 refills | Status: DC

## 2024-01-22 NOTE — Progress Notes (Signed)
 [facility] [address] [place], [place] [address]  Internal MEDICINE  Office Visit Note  Patient Name: Kara [name]  [id number]  [id number]  Date of Service: 01/22/2024  Chief Complaint  Patient presents with  . Diabetes  . Hypertension  . Hyperlipidemia  . Follow-up    HPI Tatayana presents for a follow-up visit for diabetes, neuropathy, heart failure, low back pain, obesity, arthritis and high cholesterol.  Diabetes -- A1c has improved to 7.2 today from 8.2 in may since starting ozempic . She is also still on jardiance  and insulin .  Heart failure -- managed by cardiology  Low back pain, cauda equina, spinal stenosis, s/p back surgery.  Neuropathy -- takes pregabalin  twice daily, due for refills.  Sees eye doctor for diabetic eye exams and macular edema.  Obesity -- she is on ozempic  which is helping with weight loss and her glucose level Polyarthritis -- takes tylenol  as needed.  High cholesterol -- takes rosuvastatin  daily and OTC krill oil capsules.     Current Medication: Outpatient Encounter Medications as of 01/22/2024  Medication Sig Note  . acetaminophen  (TYLENOL ) 325 MG tablet Take 2 tablets (650 mg total) by mouth 3 (three) times daily.   . Aflibercept  (EYLEA ) 2 MG/0.05ML SOLN  12/22/2021: Every 3 months  . Calcium -Magnesium -Vitamin D  (CALCIUM  1200+D3 PO) Take 1 tablet by mouth daily.   . Collagen-Boron-Hyaluronic Acid (MOVE FREE ULTRA JOINT HEALTH) 40-5-3.3 MG TABS Take 1 tablet by mouth daily.   . doxycycline  (MONODOX ) 100 MG capsule TAKE 1 CAPSULE BY MOUTH TWICE DAILY WITH FOOD   . empagliflozin  (JARDIANCE ) 25 MG TABS tablet Take 1 tablet (25 mg total) by mouth daily before breakfast.   . Ferrous Gluconate  324 (37.5 Fe) MG TABS Take 324 mg by mouth daily.   . gentamicin  cream (GARAMYCIN ) 0.1 % Apply to ulcers once daily.   . hydrocortisone  2.5 % cream APPLY TO AFFECTED AREAS BUTTOCKS ONCE TO TWICE DAILY UNTIL IMPROVED.   . insulin  glargine (LANTUS ) 100  UNIT/ML Solostar Pen Inject 40 Units into the skin daily.   . ketoconazole  (NIZORAL ) 2 % cream Apply to affected area buttocks twice daily until improved.   . Krill Oil 500 MG CAPS Take 1 capsule by mouth daily.   . Lactobacillus (PROBIOTIC ACIDOPHILUS) CAPS Take 1 capsule by mouth daily.   . Multiple Vitamin (MULTIVITAMIN) tablet Take 1 tablet by mouth daily at 12 noon.   . pregabalin  (LYRICA ) 100 MG capsule Take 1 capsule (100 mg total) by mouth 2 (two) times daily.   . Semaglutide , 1 MG/DOSE, 4 MG/3ML SOPN Inject 1 mg as directed once a week.   . tamoxifen  (NOLVADEX ) 20 MG tablet Take 1 tablet (20 mg total) by mouth daily.   . triamcinolone  (KENALOG ) 0.025 % cream Apply 1 Application topically 2 (two) times daily as needed (itching). To groin area   . [DISCONTINUED] insulin  aspart (NOVOLOG  FLEXPEN) 100 UNIT/ML FlexPen INJECT [place] 3 times daily with meals AS DIRECTED PER SLIDING SCALE. MAX DAILY DOSE IS 30 UNITS. DX E11.65.   . [DISCONTINUED] pantoprazole  (PROTONIX ) 40 MG tablet TAKE 1 TABLET(40 MG) BY MOUTH DAILY   . [DISCONTINUED] pregabalin  (LYRICA ) 100 MG capsule Take 1 capsule (100 mg total) by mouth 2 (two) times daily.   . [DISCONTINUED] rosuvastatin  (CRESTOR ) 20 MG tablet Take 1 tablet (20 mg total) by mouth daily. TAKE 1 TABLET BY MOUTH EVERYDAY AT BEDTIME   . [DISCONTINUED] torsemide  (DEMADEX ) 20 MG tablet TAKE 1 TABLET(20 MG) BY MOUTH TWICE  DAILY    No facility-administered encounter medications on file as of 01/22/2024.    Surgical History: Past Surgical History:  Procedure Laterality Date  . AMPUTATION TOE Right 02/26/2023   Procedure: SECOND DIGIT AMPUTATION;  Surgeon: Silva Juliene SAUNDERS, DPM;  Location: [facility] ORS;  Service: Orthopedics/Podiatry;  Laterality: Right;  . ANTERIOR LATERAL LUMBAR FUSION WITH PERCUTANEOUS SCREW 2 LEVEL N/A 07/03/2022   Procedure: L3-5 LATERAL LUMBAR INTERBODY FUSION WITH POSTERIOR SPINAL FUSION;  Surgeon: Clois Fret, MD;  Location: [facility] ORS;   Service: Neurosurgery;  Laterality: N/A;  . APPLICATION OF INTRAOPERATIVE CT SCAN N/A 07/03/2022   Procedure: APPLICATION OF INTRAOPERATIVE CT SCAN;  Surgeon: Clois Fret, MD;  Location: [facility] ORS;  Service: Neurosurgery;  Laterality: N/A;  . BILATERAL SALPINGECTOMY    . BREAST BIOPSY Right 2014   neg- core  . BREAST BIOPSY Right 12/21/2018   US  bx, venus clip,  DUCTAL CARCINOMA IN SITU  . BREAST BIOPSY Right 12/21/2018   US  bx, ribbon clip,  FIBROEPITHELIAL PROLIFERATION WITH SCLEROSIS  . BREAST BIOPSY Right 01/01/2019   Affirm bx X clip-path pending  . BREAST DUCTAL SYSTEM EXCISION    . BREAST EXCISIONAL BIOPSY Right 2014   neg  . BREAST LUMPECTOMY Right 01/22/2019   path pending  . CATARACT EXTRACTION    . CHOLECYSTECTOMY    . COLONOSCOPY WITH PROPOFOL  N/A 06/25/2023   Procedure: COLONOSCOPY WITH PROPOFOL ;  Surgeon: Therisa Bi, MD;  Location: [facility] ENDOSCOPY;  Service: Gastroenterology;  Laterality: N/A;  . CYSTOSCOPY WITH STENT PLACEMENT Left 04/14/2015   Procedure: CYSTOSCOPY WITH STENT PLACEMENT;  Surgeon: Rosina Riis, MD;  Location: [facility] ORS;  Service: Urology;  Laterality: Left;  . EXTRACORPOREAL SHOCK WAVE LITHOTRIPSY Left 09/29/2014   Procedure: EXTRACORPOREAL SHOCK WAVE LITHOTRIPSY (ESWL);  Surgeon: Rosina Riis, MD;  Location: [facility] ORS;  Service: Urology;  Laterality: Left;  . EYE SURGERY     bilateral cataract  . HEMOSTASIS CLIP PLACEMENT  06/25/2023   Procedure: CONTROL OF HEMORRHAGE, GI TRACT, ENDOSCOPIC, BY CLIPPING OR OVERSEWING;  Surgeon: Therisa Bi, MD;  Location: [facility] ENDOSCOPY;  Service: Gastroenterology;;  . IRRIGATION AND DEBRIDEMENT ABSCESS Right 03/24/2019   Procedure: IRRIGATION AND DEBRIDEMENT ABSCESS RIGHT BREAST;  Surgeon: Rodolph Romano, MD;  Location: [facility] ORS;  Service: General;  Laterality: Right;  . LAPAROSCOPIC OOPHERECTOMY Left   . PARTIAL MASTECTOMY WITH NEEDLE LOCALIZATION Right 01/22/2019   Procedure: PARTIAL MASTECTOMY WITH NEEDLE  LOCALIZATION;  Surgeon: Rodolph Romano, MD;  Location: [facility] ORS;  Service: General;  Laterality: Right;  . POLYPECTOMY  06/25/2023   Procedure: POLYPECTOMY;  Surgeon: Therisa Bi, MD;  Location[facility];  Service: Gastroenterology;;  . TONSILLECTOMY    . TUBAL LIGATION    . URETEROSCOPY WITH HOLMIUM LASER LITHOTRIPSY Left 04/14/2015   Procedure: URETEROSCOPY WITH HOLMIUM LASER LITHOTRIPSY;  Surgeon: Rosina Riis, MD;  Location: [facility] ORS;  Service: Urology;  Laterality: Left;    Medical History: Past Medical History:  Diagnosis Date  . Anemia   . Arthropathy   . Benign neoplasm of breast   . Breast cancer (HCC)    DCIS  . Chest pain   . CKD (chronic kidney disease), stage IV (HCC)   . Diabetes (HCC)   . Diabetic retinopathy (HCC)   . Dyspnea   . Edema   . History of kidney stones   . Hyperlipemia   . Hypersomnia with sleep apnea   . Hypertension   . Inflammatory and toxic neuropathy   . Lumbago   . Neuropathy   .  Osteoarthrosis   . Osteoporosis   . Ovarian failure   . Personal history of radiation therapy   . Pneumonia due to COVID-19 virus 12/21/2021   a.) required inpatient admission  . SOB (shortness of breath)     Family History: Family History  Problem Relation Age of Onset  . Breast cancer Maternal Grandmother 66  . Colon cancer Mother   . Lung cancer Father     Social History   Socioeconomic History  . Marital status: Married    Spouse name: Not on file  . Number of children: Not on file  . Years of education: Not on file  . Highest education level: Not on file  Occupational History  . Not on file  Tobacco Use  . Smoking status: Former    Current packs/day: 0.00    Average packs/day: 1 pack/day for 30.0 years (30.0 ttl pk-yrs)    Types: Cigarettes    Start date: 04/10/1965    Quit date: 04/11/1995    Years since quitting: 28.8  . Smokeless tobacco: Never  Vaping Use  . Vaping status: Never Used  Substance and Sexual Activity  .  Alcohol use: No    Alcohol/week: 0.0 standard drinks of alcohol  . Drug use: No  . Sexual activity: Not on file  Other Topics Concern  . Not on file  Social History Narrative  . Not on file   Social Drivers of Health   Financial Resource Strain: Low Risk  (10/03/2020)   Overall Financial Resource Strain (CARDIA)   . Difficulty of Paying Living Expenses: Not very hard  Food Insecurity: No Food Insecurity (02/24/2023)   Hunger Vital Sign   . Worried About Programme Researcher, Broadcasting/film/video in the Last Year: Never true   . Ran Out of Food in the Last Year: Never true  Transportation Needs: No Transportation Needs (02/24/2023)   PRAPARE - Transportation   . Lack of Transportation (Medical): No   . Lack of Transportation (Non-Medical): No  Physical Activity: Not on file  Stress: Not on file  Social Connections: Not on file  Intimate Partner Violence: Not At Risk (02/24/2023)   Humiliation, Afraid, Rape, and Kick questionnaire   . Fear of Current or Ex-Partner: No   . Emotionally Abused: No   . Physically Abused: No   . Sexually Abused: No      Review of Systems  Constitutional:  Positive for fatigue. Negative for chills and unexpected weight change.  HENT:  Negative for congestion, rhinorrhea, sneezing and sore throat.   Eyes:  Negative for redness.  Respiratory: Negative.  Negative for cough, chest tightness, shortness of breath and wheezing.   Cardiovascular: Negative.  Negative for chest pain and palpitations.  Gastrointestinal: Negative.  Negative for abdominal pain, constipation, diarrhea, nausea and vomiting.  Genitourinary:  Negative for dysuria and frequency.  Musculoskeletal:  Positive for arthralgias and gait problem (using cane). Negative for back pain, joint swelling and neck pain.  Skin:  Negative for rash.  Neurological:  Negative for tremors and numbness.  Hematological:  Negative for adenopathy. Does not bruise/bleed easily.  Psychiatric/Behavioral:  Negative for behavioral  problems (Depression), sleep disturbance and suicidal ideas. The patient is not nervous/anxious.     Vital Signs: BP (!) 140/78   Pulse 74   Temp (!) 96.3 F (35.7 C)   Resp 16   Ht 5' (1.524 m)   Wt 182 lb 3.2 oz (82.6 kg)   SpO2 98%   BMI 35.58 kg/m  Physical Exam Vitals reviewed.  Constitutional:      General: She is not in acute distress.    Appearance: Normal appearance. She is obese. She is not ill-appearing.  HENT:     Head: Normocephalic and atraumatic.  Eyes:     Pupils: Pupils are equal, round, and reactive to light.  Cardiovascular:     Rate and Rhythm: Normal rate and regular rhythm.  Pulmonary:     Effort: Pulmonary effort is normal. No respiratory distress.  Neurological:     Mental Status: She is alert and oriented to person, place, and time.  Psychiatric:        Mood and Affect: Mood normal.        Behavior: Behavior normal.        Assessment/Plan: 1. Type 2 diabetes mellitus with stage 3b chronic kidney disease, with long-term current use of insulin  (HCC) (Primary) *** - POCT glycosylated hemoglobin (Hb A1C) - Urine Microalbumin w/creat. ratio  2. Diabetic polyneuropathy associated with type 2 diabetes mellitus (HCC) *** - pregabalin  (LYRICA ) 100 MG capsule; Take 1 capsule (100 mg total) by mouth 2 (two) times daily.  Dispense: 180 capsule; Refill: 0  3. Chronic diastolic heart failure (HCC) ***  4. Central stenosis of spinal canal *** - pregabalin  (LYRICA ) 100 MG capsule; Take 1 capsule (100 mg total) by mouth 2 (two) times daily.  Dispense: 180 capsule; Refill: 0  5. Cauda equina syndrome (HCC) *** - pregabalin  (LYRICA ) 100 MG capsule; Take 1 capsule (100 mg total) by mouth 2 (two) times daily.  Dispense: 180 capsule; Refill: 0  6. Inflammatory polyarthropathy (HCC) ***  7. Tributary (branch) retinal vein occlusion, left eye, with macular edema (HCC) ***  8. Obesity, morbid (HCC) ***   General Counseling: Esta verbalizes  understanding of the findings of todays visit and agrees with plan of treatment. I have discussed any further diagnostic evaluation that may be needed or ordered today. We also reviewed her medications today. she has been encouraged to call the office with any questions or concerns that should arise related to todays visit.    Orders Placed This Encounter  Procedures  . Urine Microalbumin w/creat. ratio  . POCT glycosylated hemoglobin (Hb A1C)    Meds ordered this encounter  Medications  . pregabalin  (LYRICA ) 100 MG capsule    Sig: Take 1 capsule (100 mg total) by mouth 2 (two) times daily.    Dispense:  180 capsule    Refill:  0    For next fill when due, fill for 90 days    Return in about 3 months (around 04/13/2024) for F/U, pain med refill, Teagyn Fishel PCP.   Total time spent:30 Minutes Time spent includes review of chart, medications, test results, and follow up plan with the patient.   [place] Controlled Substance Database was reviewed by me.  This patient was seen by Mardy Maxin, FNP-C in collaboration with Dr. Sigrid Bathe as a part of collaborative care agreement.   Tresea Heine R. Maxin, MSN, FNP-C Internal medicine

## 2024-01-23 LAB — MICROALBUMIN / CREATININE URINE RATIO
Creatinine, Urine: 30.1 mg/dL
Microalb/Creat Ratio: 37 mg/g{creat} — ABNORMAL HIGH (ref 0–29)
Microalbumin, Urine: 11.1 ug/mL

## 2024-01-26 ENCOUNTER — Ambulatory Visit: Admitting: Podiatry

## 2024-01-26 ENCOUNTER — Encounter: Payer: Self-pay | Admitting: Podiatry

## 2024-01-26 DIAGNOSIS — E1140 Type 2 diabetes mellitus with diabetic neuropathy, unspecified: Secondary | ICD-10-CM

## 2024-01-26 DIAGNOSIS — Z794 Long term (current) use of insulin: Secondary | ICD-10-CM | POA: Diagnosis not present

## 2024-01-26 DIAGNOSIS — Z89421 Acquired absence of other right toe(s): Secondary | ICD-10-CM

## 2024-01-26 DIAGNOSIS — L84 Corns and callosities: Secondary | ICD-10-CM | POA: Diagnosis not present

## 2024-01-30 NOTE — Progress Notes (Signed)
 Subjective:  Patient ID: Kara [name], female    DOB: 05/02/1945,  MRN: [id number]  Kara [name] presents to clinic today for at risk foot care with history of diabetic neuropathy and callus(es) of both feet and painful mycotic toenails that are difficult to trim. Painful toenails interfere with ambulation. Aggravating factors include wearing enclosed shoe gear. Pain is relieved with periodic professional debridement. Painful calluses are aggravated when weightbearing with and without shoegear. Pain is relieved with periodic professional debridement. Patient relates right 5th toe is tender on outside of toenail. Chief Complaint  Patient presents with   Toe Pain    RFC. NP Liana is her PCP.A1c 8.2. Last office visit was 01/22/2024    PCP is Liana Fish, NP.  Allergies  Allergen Reactions   Amoxicillin  Nausea And Vomiting    Review of Systems: Negative except as noted in the HPI.  Objective:  There were no vitals filed for this visit. Kara [name] is a pleasant 78 y.o. female in NAD. AAO x 3.  Vascular Examination: Capillary refill time immediate b/l. Vascular status intact b/l with palpable pedal pulses. Pedal hair present b/l. No pain with calf compression b/l. Skin temperature gradient WNL b/l. No cyanosis or clubbing b/l. No ischemia or gangrene noted b/l. Trace edema noted BLE.  Neurological Examination: Sensation grossly intact b/l with 10 gram monofilament. Vibratory sensation intact b/l.   Dermatological Examination: She has a pressure injury on the right 3rd digit dorsally which is presenting as a hyperkeratotic dried blister. This is from toenail impingement of the right great toenail. No break in skin. There is no cellulitis nor fluctuance noted.   Pedal skin with normal turgor, texture and tone b/l.  No open wounds. No interdigital macerations.   Toenails 1-5 left, 1, 3-5 right thick, discolored, elongated with subungual debris and pain on dorsal  palpation.   Resolved HKT distal tip of left 2nd toe and medial IPJ b/l great toes. Porokeratotic lesion(s) lateral aspect DIPJ right 5th toe. No erythema, no edema, no drainage, no fluctuance.  Musculoskeletal Examination: Muscle strength 5/5 to all lower extremity muscle groups bilaterally. Adductovarus deformity bilateral 5th toes. Utilizes cane for ambulation assistance. Lower extremity amputation(s): digital amputation R 2nd toe.  Radiographs: None  Assessment/Plan: 1. Status post amputation of lesser toe of right foot   2. Pre-ulcerative corn or callous   3. Type 2 diabetes mellitus with diabetic neuropathy, with long-term current use of insulin  (HCC)   -Consent given for treatment as described below: -Examined patient. -Continue foot and shoe inspections daily. Monitor blood glucose per PCP/Endocrinologist's recommendations. -Toenails 1-5 b/l were debrided in length and girth with sterile nail nippers and dremel without iatrogenic bleeding.  -Preulcerative lesion pared lateral aspect right 5th toe utilizing sterile scalpel blade. Total number pared=1. Applied TAO. Patient instructed to apply Neosporin once daily for one week. -Patient/POA to call should there be question/concern in the interim.   No follow-ups on file.  Delon LITTIE Merlin, DPM      [place] LOCATION: [address], [place] [address]                   Office [phone]   [place] LOCATION: [phone]  [facility] [place], [place] [address] Office [phone]

## 2024-02-01 ENCOUNTER — Other Ambulatory Visit: Payer: Self-pay | Admitting: Nurse Practitioner

## 2024-02-01 DIAGNOSIS — I152 Hypertension secondary to endocrine disorders: Secondary | ICD-10-CM

## 2024-02-03 ENCOUNTER — Telehealth: Payer: Self-pay | Admitting: Oncology

## 2024-02-03 NOTE — Telephone Encounter (Signed)
 Pt calling and states her mammogram is rescheduled to 11/13 and she wants to cancel her appt with Dr. Georgina on 10/24 and reschedule it but she wants it to be a video visit. Can I schedule the pt a video visit?

## 2024-02-04 ENCOUNTER — Encounter

## 2024-02-08 ENCOUNTER — Other Ambulatory Visit: Payer: Self-pay | Admitting: Nurse Practitioner

## 2024-02-08 DIAGNOSIS — I700 Atherosclerosis of aorta: Secondary | ICD-10-CM

## 2024-02-09 ENCOUNTER — Other Ambulatory Visit: Payer: Self-pay

## 2024-02-09 DIAGNOSIS — Z760 Encounter for issue of repeat prescription: Secondary | ICD-10-CM

## 2024-02-09 MED ORDER — NOVOLOG FLEXPEN 100 UNIT/ML ~~LOC~~ SOPN: PEN_INJECTOR | SUBCUTANEOUS | 3 refills | Status: DC

## 2024-02-09 MED ORDER — PANTOPRAZOLE SODIUM 40 MG PO TBEC: 40.0000 mg | DELAYED_RELEASE_TABLET | Freq: Every day | ORAL | 1 refills | Status: AC

## 2024-02-11 ENCOUNTER — Telehealth: Payer: Self-pay

## 2024-02-11 NOTE — Telephone Encounter (Signed)
 Fax over refill request to [facility] for Ozempic , patient can get one more refill and starting in 2026 patient will no longer be able to get Ozempic  with [facility] with her insurance.

## 2024-02-12 ENCOUNTER — Telehealth: Admitting: Oncology

## 2024-02-13 ENCOUNTER — Ambulatory Visit: Admitting: Oncology

## 2024-02-20 ENCOUNTER — Telehealth: Payer: Self-pay

## 2024-02-20 NOTE — Telephone Encounter (Signed)
 Notified patient that patient assistance Ozempic  is ready for pick-up.

## 2024-02-22 ENCOUNTER — Encounter: Payer: Self-pay | Admitting: Nurse Practitioner

## 2024-02-22 DIAGNOSIS — G834 Cauda equina syndrome: Secondary | ICD-10-CM | POA: Insufficient documentation

## 2024-02-22 DIAGNOSIS — H34832 Tributary (branch) retinal vein occlusion, left eye, with macular edema: Secondary | ICD-10-CM | POA: Insufficient documentation

## 2024-02-26 ENCOUNTER — Encounter: Payer: Self-pay | Admitting: Nurse Practitioner

## 2024-02-28 ENCOUNTER — Other Ambulatory Visit: Payer: Self-pay | Admitting: Nurse Practitioner

## 2024-03-04 ENCOUNTER — Ambulatory Visit
Admission: RE | Admit: 2024-03-04 | Discharge: 2024-03-04 | Disposition: A | Discharge status: Home / Self Care | Source: Ambulatory Visit | Attending: Oncology | Admitting: Oncology

## 2024-03-04 DIAGNOSIS — D0511 Intraductal carcinoma in situ of right breast: Secondary | ICD-10-CM | POA: Diagnosis not present

## 2024-03-04 DIAGNOSIS — Z1231 Encounter for screening mammogram for malignant neoplasm of breast: Secondary | ICD-10-CM | POA: Insufficient documentation

## 2024-03-11 ENCOUNTER — Inpatient Hospital Stay: Attending: Oncology | Admitting: Oncology

## 2024-03-11 ENCOUNTER — Encounter: Payer: Self-pay | Admitting: Oncology

## 2024-03-11 VITALS — BP 135/69 | HR 77 | Temp 97.1°F | Resp 18 | Ht 60.0 in | Wt 183.0 lb

## 2024-03-11 DIAGNOSIS — I129 Hypertensive chronic kidney disease with stage 1 through stage 4 chronic kidney disease, or unspecified chronic kidney disease: Secondary | ICD-10-CM | POA: Diagnosis not present

## 2024-03-11 DIAGNOSIS — Z981 Arthrodesis status: Secondary | ICD-10-CM | POA: Diagnosis not present

## 2024-03-11 DIAGNOSIS — D0511 Intraductal carcinoma in situ of right breast: Secondary | ICD-10-CM | POA: Diagnosis not present

## 2024-03-11 DIAGNOSIS — M810 Age-related osteoporosis without current pathological fracture: Secondary | ICD-10-CM | POA: Diagnosis not present

## 2024-03-11 DIAGNOSIS — Z800 Family history of malignant neoplasm of digestive organs: Secondary | ICD-10-CM | POA: Insufficient documentation

## 2024-03-11 DIAGNOSIS — Z803 Family history of malignant neoplasm of breast: Secondary | ICD-10-CM | POA: Insufficient documentation

## 2024-03-11 DIAGNOSIS — Z8616 Personal history of COVID-19: Secondary | ICD-10-CM | POA: Insufficient documentation

## 2024-03-11 DIAGNOSIS — Z86018 Personal history of other benign neoplasm: Secondary | ICD-10-CM | POA: Diagnosis not present

## 2024-03-11 DIAGNOSIS — Z79810 Long term (current) use of selective estrogen receptor modulators (SERMs): Secondary | ICD-10-CM | POA: Insufficient documentation

## 2024-03-11 DIAGNOSIS — Z9079 Acquired absence of other genital organ(s): Secondary | ICD-10-CM | POA: Insufficient documentation

## 2024-03-11 DIAGNOSIS — N184 Chronic kidney disease, stage 4 (severe): Secondary | ICD-10-CM | POA: Insufficient documentation

## 2024-03-11 DIAGNOSIS — Z87442 Personal history of urinary calculi: Secondary | ICD-10-CM | POA: Diagnosis not present

## 2024-03-11 DIAGNOSIS — L120 Bullous pemphigoid: Secondary | ICD-10-CM | POA: Diagnosis not present

## 2024-03-11 DIAGNOSIS — Z923 Personal history of irradiation: Secondary | ICD-10-CM | POA: Diagnosis not present

## 2024-03-11 DIAGNOSIS — Z79899 Other long term (current) drug therapy: Secondary | ICD-10-CM | POA: Insufficient documentation

## 2024-03-11 DIAGNOSIS — Z89421 Acquired absence of other right toe(s): Secondary | ICD-10-CM | POA: Insufficient documentation

## 2024-03-11 DIAGNOSIS — R9230 Dense breasts, unspecified: Secondary | ICD-10-CM | POA: Insufficient documentation

## 2024-03-11 DIAGNOSIS — Z9049 Acquired absence of other specified parts of digestive tract: Secondary | ICD-10-CM | POA: Diagnosis not present

## 2024-03-11 DIAGNOSIS — Z87891 Personal history of nicotine dependence: Secondary | ICD-10-CM | POA: Diagnosis not present

## 2024-03-11 DIAGNOSIS — Z9011 Acquired absence of right breast and nipple: Secondary | ICD-10-CM | POA: Insufficient documentation

## 2024-03-11 DIAGNOSIS — E1122 Type 2 diabetes mellitus with diabetic chronic kidney disease: Secondary | ICD-10-CM | POA: Diagnosis not present

## 2024-03-11 DIAGNOSIS — Z8701 Personal history of pneumonia (recurrent): Secondary | ICD-10-CM | POA: Diagnosis not present

## 2024-03-11 DIAGNOSIS — Z801 Family history of malignant neoplasm of trachea, bronchus and lung: Secondary | ICD-10-CM | POA: Diagnosis not present

## 2024-03-11 DIAGNOSIS — Z880 Allergy status to penicillin: Secondary | ICD-10-CM | POA: Diagnosis not present

## 2024-03-11 NOTE — Progress Notes (Signed)
 [facility]  [phone] [phone]  ID: Kara [name] OB: 03-29-1946  MR#: [id number]  RDW#:[id number]  Patient Care Team: Liana Fish, NP as PCP - General (Nurse Practitioner) Jacobo Evalene PARAS, MD as Consulting Physician (Oncology) Rodolph Romano, MD as Consulting Physician (General Surgery) Lenn Aran, MD as Referring Physician (Radiation Oncology) Vincenzo Slough, [facility] (Inactive) as Pharmacist (Pharmacist)   CHIEF COMPLAINT: Low-grade DCIS of the right breast.  INTERVAL HISTORY: Patient returns to clinic today for routine 39-month evaluation.  She continues to tolerate tamoxifen  well without significant side effects.  She currently feels well and is asymptomatic.  She has no neurologic complaints.  She denies any recent fevers or illnesses.  She has a good appetite and denies weight loss.  She has no chest pain, shortness of breath, cough, or hemoptysis.  She denies any nausea, vomiting, constipation, or diarrhea.  She has no urinary complaints.  Patient offers no specific complaints today.  REVIEW OF SYSTEMS:   Review of Systems  Constitutional: Negative.  Negative for fever, malaise/fatigue and weight loss.  Respiratory: Negative.  Negative for cough, hemoptysis and shortness of breath.   Cardiovascular: Negative.  Negative for chest pain and leg swelling.  Gastrointestinal: Negative.  Negative for abdominal pain.  Genitourinary: Negative.  Negative for dysuria.  Musculoskeletal: Negative.  Negative for back pain and joint pain.  Skin:  Negative for itching and rash.  Neurological: Negative.  Negative for dizziness, focal weakness, weakness and headaches.  Psychiatric/Behavioral: Negative.  The patient is not nervous/anxious.     As per HPI. Otherwise, a complete review of systems is negative.  PAST MEDICAL HISTORY: Past Medical History:  Diagnosis Date   Anemia    Arthropathy    Benign neoplasm of breast     Breast cancer (HCC)    DCIS   Chest pain    CKD (chronic kidney disease), stage IV (HCC)    Diabetes (HCC)    Diabetic retinopathy (HCC)    Dyspnea    Edema    History of kidney stones    Hyperlipemia    Hypersomnia with sleep apnea    Hypertension    Inflammatory and toxic neuropathy    Lumbago    Neuropathy    Osteoarthrosis    Osteoporosis    Ovarian failure    Personal history of radiation therapy    Pneumonia due to COVID-19 virus 12/21/2021   a.) required inpatient admission   SOB (shortness of breath)     PAST SURGICAL HISTORY: Past Surgical History:  Procedure Laterality Date   AMPUTATION TOE Right 02/26/2023   Procedure: SECOND DIGIT AMPUTATION;  Surgeon: Silva Juliene SAUNDERS, DPM;  Location: [facility] ORS;  Service: Orthopedics/Podiatry;  Laterality: Right;   ANTERIOR LATERAL LUMBAR FUSION WITH PERCUTANEOUS SCREW 2 LEVEL N/A 07/03/2022   Procedure: L3-5 LATERAL LUMBAR INTERBODY FUSION WITH POSTERIOR SPINAL FUSION;  Surgeon: Clois Fret, MD;  Location: [facility] OR[facility];  Service: Neurosurgery;  Laterality: N/A;   APPLICATION OF INTRAOPERATIVE CT SCAN N/A 07/03/2022   Procedure: APPLICATION OF INTRAOPERATIVE CT SCAN;  Surgeon: Clois Fret, MD;  Location: [facility] OR[facility];  Service: Neurosurgery;  Laterality: N/A;   BILATERAL SALPINGECTOMY     BREAST BIOPSY Right 2014   neg- core   BREAST BIOPSY Right 12/21/2018   US  bx, venus clip,  DUCTAL CARCINOMA IN SITU   BREAST BIOPSY Right 12/21/2018   US  bx, ribbon clip,  FIBROEPITHELIAL PROLIFERATION WITH SCLEROSIS   BREAST BIOPSY Right 01/01/2019   Affirm bx X  clip-path pending   BREAST DUCTAL SYSTEM EXCISION     BREAST EXCISIONAL BIOPSY Right 2014   neg   BREAST LUMPECTOMY Right 01/22/2019   path pending   CATARACT EXTRACTION     CHOLECYSTECTOMY     COLONOSCOPY WITH PROPOFOL  N/A 06/25/2023   Procedure: COLONOSCOPY WITH PROPOFOL ;  Surgeon: Therisa Bi, MD;  Location: [facility] ENDOSCOPY;  Service: Gastroenterology;  Laterality: N/A;    CYSTOSCOPY WITH STENT PLACEMENT Left 04/14/2015   Procedure: CYSTOSCOPY WITH STENT PLACEMENT;  Surgeon: Rosina Riis, MD;  Location: [facility] OR[facility];  Service: Urology;  Laterality: Left;   EXTRACORPOREAL SHOCK WAVE LITHOTRIPSY Left 09/29/2014   Procedure: EXTRACORPOREAL SHOCK WAVE LITHOTRIPSY (ESWL);  Surgeon: Rosina Riis, MD;  Location: [facility] OR[facility];  Service: Urology;  Laterality: Left;   EYE SURGERY     bilateral cataract   HEMOSTASIS CLIP PLACEMENT  06/25/2023   Procedure: CONTROL OF HEMORRHAGE, GI TRACT, ENDOSCOPIC, BY CLIPPING OR OVERSEWING;  Surgeon: Therisa Bi, MD;  Location: [facility] ENDOSCOPY;  Service: Gastroenterology;;   IRRIGATION AND DEBRIDEMENT ABSCESS Right 03/24/2019   Procedure: IRRIGATION AND DEBRIDEMENT ABSCESS RIGHT BREAST;  Surgeon: Rodolph Romano, MD;  Location: [facility] ORS;  Service: General;  Laterality: Right;   LAPAROSCOPIC OOPHERECTOMY Left    PARTIAL MASTECTOMY WITH NEEDLE LOCALIZATION Right 01/22/2019   Procedure: PARTIAL MASTECTOMY WITH NEEDLE LOCALIZATION;  Surgeon: Rodolph Romano, MD;  Location: [facility] OR[facility];  Service: General;  Laterality: Right;   POLYPECTOMY  06/25/2023   Procedure: POLYPECTOMY;  Surgeon: Therisa Bi, MD;  Location: [facility] ENDOSCOPY;  Service: Gastroenterology;;   TONSILLECTOMY     TUBAL LIGATION     URETEROSCOPY WITH HOLMIUM LASER LITHOTRIPSY Left 04/14/2015   Procedure: URETEROSCOPY WITH HOLMIUM LASER LITHOTRIPSY;  Surgeon: Rosina Riis, MD;  Location: [facility] ORS;  Service: Urology;  Laterality: Left;    FAMILY HISTORY: Family History  Problem Relation Age of Onset   Breast cancer Maternal Grandmother 19   Colon cancer Mother    Lung cancer Father     ADVANCED DIRECTIVES (Y/N):  N  HEALTH MAINTENANCE: Social History   Tobacco Use   Smoking status: Former    Current packs/day: 0.00    Average packs/day: 1 pack/day for 30.0 years (30.0 ttl pk-yrs)    Types: Cigarettes    Start date: 04/10/1965    Quit date: 04/11/1995    Years since  quitting: 28.9   Smokeless tobacco: Never  Vaping Use   Vaping status: Never Used  Substance Use Topics   Alcohol use: No    Alcohol/week: 0.0 standard drinks of alcohol   Drug use: No     Colonoscopy:  PAP:  Bone density:  Lipid panel:  Allergies  Allergen Reactions   Amoxicillin  Nausea And Vomiting    Current Outpatient Medications  Medication Sig Dispense Refill   acetaminophen  (TYLENOL ) 325 MG tablet Take 2 tablets (650 mg total) by mouth 3 (three) times daily.     Aflibercept  (EYLEA ) 2 MG/0.05ML SOLN      Calcium -Magnesium -Vitamin D  (CALCIUM  1200+D3 PO) Take 1 tablet by mouth daily.     Collagen-Boron-Hyaluronic Acid (MOVE FREE ULTRA JOINT HEALTH) 40-5-3.3 MG TABS Take 1 tablet by mouth daily.     doxycycline  (MONODOX ) 100 MG capsule TAKE 1 CAPSULE BY MOUTH TWICE DAILY WITH FOOD 60 capsule 2   empagliflozin  (JARDIANCE ) 25 MG TABS tablet Take 1 tablet (25 mg total) by mouth daily before breakfast. 30 tablet 5   Ferrous Gluconate  324 (37.5 Fe) MG TABS Take 324 mg by mouth daily.  gentamicin  cream (GARAMYCIN ) 0.1 % Apply to ulcers once daily. 30 g 1   hydrocortisone  2.5 % cream APPLY TO AFFECTED AREAS BUTTOCKS ONCE TO TWICE DAILY UNTIL IMPROVED. 28 g 0   insulin  aspart (NOVOLOG  FLEXPEN) 100 UNIT/ML FlexPen INJECT UNDER THE SKIN THREE TIMES DAILY WITH MEALS AS DIRECTED PER SLIDING SCALE. MAX DAILY DOSE IS 30 UNITS 15 mL 3   insulin  glargine (LANTUS ) 100 UNIT/ML Solostar Pen Inject 40 Units into the skin daily. 15 mL 11   ketoconazole  (NIZORAL ) 2 % cream Apply to affected area buttocks twice daily until improved. 30 g 0   Krill Oil 500 MG CAPS Take 1 capsule by mouth daily.     Lactobacillus (PROBIOTIC ACIDOPHILUS) CAPS Take 1 capsule by mouth daily.     Multiple Vitamin (MULTIVITAMIN) tablet Take 1 tablet by mouth daily at 12 noon.     pantoprazole  (PROTONIX ) 40 MG tablet Take 1 tablet (40 mg total) by mouth daily. TAKE 1 TABLET(40 MG) BY MOUTH DAILY 90 tablet 1   pregabalin   (LYRICA ) 100 MG capsule Take 1 capsule (100 mg total) by mouth 2 (two) times daily. 180 capsule 0   rosuvastatin  (CRESTOR ) 20 MG tablet TAKE 1 TABLET(20 MG) BY MOUTH EVERY DAY AT BEDTIME 90 tablet 3   Semaglutide , 1 MG/DOSE, 4 MG/3ML SOPN Inject 1 mg as directed once a week. 3 mL 5   tamoxifen  (NOLVADEX ) 20 MG tablet Take 1 tablet (20 mg total) by mouth daily. 90 tablet 3   torsemide  (DEMADEX ) 20 MG tablet TAKE 1 TABLET(20 MG) BY MOUTH TWICE DAILY 180 tablet 1   triamcinolone  (KENALOG ) 0.025 % cream Apply 1 Application topically 2 (two) times daily as needed (itching). To groin area 80 g 5   No current facility-administered medications for this visit.    OBJECTIVE: Vitals:   03/11/24 1102  BP: 135/69  Pulse: 77  Resp: 18  Temp: (!) 97.1 F (36.2 C)  SpO2: 98%     Body mass index is 35.74 kg/m.    ECOG FS:0 - Asymptomatic  General: Well-developed, well-nourished, no acute distress. Eyes: Pink conjunctiva, anicteric sclera. HEENT: Normocephalic, moist mucous membranes. Lungs: No audible wheezing or coughing. Heart: Regular rate and rhythm. Abdomen: Soft, nontender, no obvious distention. Musculoskeletal: No edema, cyanosis, or clubbing. Neuro: Alert, answering all questions appropriately. Cranial nerves grossly intact. Skin: No rashes or petechiae noted. Psych: Normal affect.  LAB RESULTS:  Lab Results  Component Value Date   NA 142 09/12/2023   K 4.0 09/12/2023   CL 99 09/12/2023   CO2 25 09/12/2023   GLUCOSE 112 (H) 09/12/2023   BUN 36 (H) 09/12/2023   CREATININE 1.55 (H) 09/12/2023   CALCIUM  9.4 09/12/2023   PROT 7.0 09/12/2023   ALBUMIN 3.5 (L) 09/12/2023   AST 21 09/12/2023   ALT 16 09/12/2023   ALKPHOS 107 09/12/2023   BILITOT 0.3 09/12/2023   GFRNONAA 30 (L) 02/28/2023   GFRAA 39 (L) 08/16/2019    Lab Results  Component Value Date   WBC 6.7 09/12/2023   NEUTROABS 4.0 09/12/2023   HGB 10.6 (L) 09/12/2023   HCT 34.5 09/12/2023   MCV 88 09/12/2023   PLT  180 09/12/2023     STUDIES: MM 3D SCREENING MAMMOGRAM BILATERAL BREAST Result Date: 03/07/2024 CLINICAL DATA:  Screening. EXAM: DIGITAL SCREENING BILATERAL MAMMOGRAM WITH TOMOSYNTHESIS AND CAD TECHNIQUE: Bilateral screening digital craniocaudal and mediolateral oblique mammograms were obtained. Bilateral screening digital breast tomosynthesis was performed. The images were evaluated with computer-aided detection.  COMPARISON:  Previous exam(s). ACR Breast Density Category b: There are scattered areas of fibroglandular density. FINDINGS: There are no findings suspicious for malignancy. IMPRESSION: No mammographic evidence of malignancy. A result letter of this screening mammogram will be mailed directly to the patient. RECOMMENDATION: Screening mammogram in one year. (Code:SM-B-01Y) BI-RADS CATEGORY  1: Negative. Electronically Signed   By: Dina  Arceo M.D.   On: 03/07/2024 07:10    ASSESSMENT: Low-grade DCIS of the right breast.  PLAN:    Low-grade DCIS of the right breast: Patient declined enrollment in COMET trial.  She underwent lumpectomy on January 22, 2019.  Final pathology noted close, but clear margins.  Patient initiated XRT, but then discontinued after approximately 3 weeks. She was extremely upset how she was treated by the radiation oncology provider and refused to return or continue XRT.  We previously discussed the possibility of transferring care to another facility which she also declined.  She acknowledged that although she has low-grade DCIS, this increases her risk of recurrence.  No further interventions are needed.  Patient will complete 5 years of tamoxifen  in December 2025.  She has been instructed to continue her with her current prescription and then discontinue.  Her most recent mammogram on March 07, 2024 was reported BI-RADS 1.  After discussion with the patient, is agreed upon that no further follow-up is necessary.  Yearly screening mammograms can now be ordered by  primary care.  Please refer patient back if there are any questions or concerns.   Bullous pemphigoid: Resolved.  Follow-up with dermatology as needed. Osteoporosis: Managed by primary care.  Patient discontinued Fosamax  secondary to intolerance.    I spent a total of 20 minutes reviewing chart data, face-to-face evaluation with the patient, counseling and coordination of care as detailed above.  Patient expressed understanding and was in agreement with this plan. She also understands that She can call clinic at any time with any questions, concerns, or complaints.    Cancer Staging  Ductal carcinoma in situ (DCIS) of right breast Staging form: Breast, AJCC 8th Edition - Clinical stage from 01/01/2019: Stage 0 (cTis (DCIS), cN0, cM0, G1, ER+, PR: Not Assessed, HER2: Not Assessed) - Signed by Jacobo Evalene PARAS, MD on 01/01/2019 Stage prefix: Initial diagnosis Histologic grading system: 3 grade system   Evalene PARAS Jacobo, MD   03/11/2024 11:28 AM

## 2024-03-17 NOTE — Progress Notes (Signed)
 Kara [name]                                          MRN: [id number]   03/17/2024   The [facility] Quality Team Specialist reviewed this patient medical record for the purposes of chart review for care gap closure. The following were reviewed: abstraction for care gap closure-kidney health evaluation for diabetes:eGFR  and uACR.    [facility] Quality Team

## 2024-03-30 ENCOUNTER — Other Ambulatory Visit: Payer: Self-pay | Admitting: Nurse Practitioner

## 2024-03-30 DIAGNOSIS — H34832 Tributary (branch) retinal vein occlusion, left eye, with macular edema: Secondary | ICD-10-CM | POA: Diagnosis not present

## 2024-03-30 DIAGNOSIS — Z961 Presence of intraocular lens: Secondary | ICD-10-CM | POA: Diagnosis not present

## 2024-03-30 DIAGNOSIS — E1159 Type 2 diabetes mellitus with other circulatory complications: Secondary | ICD-10-CM

## 2024-03-31 ENCOUNTER — Other Ambulatory Visit: Payer: Self-pay | Admitting: Nurse Practitioner

## 2024-03-31 DIAGNOSIS — E1122 Type 2 diabetes mellitus with diabetic chronic kidney disease: Secondary | ICD-10-CM

## 2024-04-03 ENCOUNTER — Other Ambulatory Visit: Payer: Self-pay | Admitting: Nurse Practitioner

## 2024-04-03 DIAGNOSIS — E1142 Type 2 diabetes mellitus with diabetic polyneuropathy: Secondary | ICD-10-CM

## 2024-04-03 DIAGNOSIS — M4800 Spinal stenosis, site unspecified: Secondary | ICD-10-CM

## 2024-04-03 DIAGNOSIS — G834 Cauda equina syndrome: Secondary | ICD-10-CM

## 2024-04-07 ENCOUNTER — Other Ambulatory Visit: Payer: Self-pay

## 2024-04-07 DIAGNOSIS — G834 Cauda equina syndrome: Secondary | ICD-10-CM

## 2024-04-07 DIAGNOSIS — E1142 Type 2 diabetes mellitus with diabetic polyneuropathy: Secondary | ICD-10-CM

## 2024-04-07 DIAGNOSIS — M4800 Spinal stenosis, site unspecified: Secondary | ICD-10-CM

## 2024-04-07 MED ORDER — PREGABALIN 100 MG PO CAPS: 100.0000 mg | ORAL_CAPSULE | Freq: Two times a day (BID) | ORAL | 0 refills | Status: DC

## 2024-04-08 ENCOUNTER — Ambulatory Visit: Admitting: Podiatry

## 2024-04-08 ENCOUNTER — Encounter: Payer: Self-pay | Admitting: Podiatry

## 2024-04-08 DIAGNOSIS — E1140 Type 2 diabetes mellitus with diabetic neuropathy, unspecified: Secondary | ICD-10-CM | POA: Diagnosis not present

## 2024-04-08 DIAGNOSIS — L84 Corns and callosities: Secondary | ICD-10-CM | POA: Diagnosis not present

## 2024-04-08 DIAGNOSIS — Z794 Long term (current) use of insulin: Secondary | ICD-10-CM

## 2024-04-10 ENCOUNTER — Other Ambulatory Visit: Payer: Self-pay | Admitting: Dermatology

## 2024-04-10 DIAGNOSIS — Z79899 Other long term (current) drug therapy: Secondary | ICD-10-CM

## 2024-04-11 NOTE — Progress Notes (Signed)
 "  Subjective:  Patient ID: Kara [name], female    DOB: June 30, 1945,  MRN: [id number]  Kara [name] presents to clinic today for at risk foot care. Patient has h/o NIDDM, neuropathy with amputation of digital amputation R 2nd toe and corn(s) R 5th toe and painful mycotic toenails that are difficult to trim. Painful toenails interfere with ambulation. Aggravating factors include wearing enclosed shoe gear. Pain is relieved with periodic professional debridement. Painful corns are aggravated when weightbearing when wearing enclosed shoe gear. Pain is relieved with periodic professional debridement.  Chief Complaint  Patient presents with   Nail Problem    Thick painful toenails, 9 week follow up    Diabetes    A1C 7.2   New problem(s): None.   PCP is Liana Fish, NP.  Allergies[1]  Review of Systems: Negative except as noted in the HPI.  Objective:  There were no vitals filed for this visit. Kara [name] is a pleasant 78 y.o. female in NAD. AAO x 3.  Vascular Examination: Capillary refill time immediate b/l. Vascular status intact b/l with palpable pedal pulses. Pedal hair present b/l. No pain with calf compression b/l. Skin temperature gradient WNL b/l. No cyanosis or clubbing b/l. No ischemia or gangrene noted b/l. Trace edema noted BLE.  Neurological Examination: Sensation grossly intact b/l with 10 gram monofilament. Vibratory sensation intact b/l.   Dermatological Examination: She has a pressure injury on the right 3rd digit dorsally which is presenting as a hyperkeratotic dried blister. This is from toenail impingement of the right great toenail. No break in skin. There is no cellulitis nor fluctuance noted.   Pedal skin with normal turgor, texture and tone b/l.  No open wounds. No interdigital macerations.   Toenails 1-5 left, 1, 3-5 right thick, discolored, elongated with subungual debris and pain on dorsal palpation.   Porokeratotic lesion(s) lateral aspect DIPJ  right 5th toe. No erythema, no edema, no drainage, no fluctuance.  Musculoskeletal Examination: Muscle strength 5/5 to all lower extremity muscle groups bilaterally. Adductovarus deformity bilateral 5th toes. Utilizes cane for ambulation assistance. Lower extremity amputation(s): digital amputation R 2nd toe.  Radiographs: None  Assessment/Plan: 1. Pre-ulcerative corn or callous   2. Type 2 diabetes mellitus with diabetic neuropathy, with long-term current use of insulin  [facility])    -Patient was evaluated today. All questions/concerns addressed on today's visit. -Continue foot and shoe inspections daily. Monitor blood glucose per PCP/Endocrinologist's recommendations. -Patient to continue soft, supportive shoe gear daily. -Toenails 1-5 left foot, 3-5 right foot, and right great toe debrided in length and girth without iatrogenic bleeding with sterile nail nipper and dremel.  -Corn(s) lateral DIPJ right 5th toe pared utilizing sterile scalpel blade without complication or incident. Total number debrided=1. -Patient/POA to call should there be question/concern in the interim.   Return in about 3 months (around 07/07/2024).  Kara [name], DPM      [place] LOCATION: [address] [place] [address]                   Office [phone]   [facility] LOCATION: [address] [place], [place] [address] Office [phone]     [1]  Allergies Allergen  Reactions   Amoxicillin  Nausea And Vomiting   "

## 2024-04-19 ENCOUNTER — Encounter: Payer: Self-pay | Admitting: Nurse Practitioner

## 2024-04-19 ENCOUNTER — Ambulatory Visit (INDEPENDENT_AMBULATORY_CARE_PROVIDER_SITE_OTHER): Admitting: Nurse Practitioner

## 2024-04-19 ENCOUNTER — Telehealth: Payer: Self-pay | Admitting: Nurse Practitioner

## 2024-04-19 VITALS — BP 138/60 | HR 80 | Temp 96.6°F | Resp 16 | Ht 60.0 in | Wt 180.6 lb

## 2024-04-19 DIAGNOSIS — E1142 Type 2 diabetes mellitus with diabetic polyneuropathy: Secondary | ICD-10-CM | POA: Diagnosis not present

## 2024-04-19 DIAGNOSIS — I700 Atherosclerosis of aorta: Secondary | ICD-10-CM | POA: Diagnosis not present

## 2024-04-19 DIAGNOSIS — N1832 Chronic kidney disease, stage 3b: Secondary | ICD-10-CM

## 2024-04-19 DIAGNOSIS — Z794 Long term (current) use of insulin: Secondary | ICD-10-CM | POA: Diagnosis not present

## 2024-04-19 DIAGNOSIS — E1122 Type 2 diabetes mellitus with diabetic chronic kidney disease: Secondary | ICD-10-CM | POA: Diagnosis not present

## 2024-04-19 DIAGNOSIS — M4800 Spinal stenosis, site unspecified: Secondary | ICD-10-CM

## 2024-04-19 MED ORDER — ROSUVASTATIN CALCIUM 20 MG PO TABS: 20.0000 mg | ORAL_TABLET | Freq: Every day | ORAL | 3 refills | Status: AC

## 2024-04-19 MED ORDER — PREGABALIN 100 MG PO CAPS: 100.0000 mg | ORAL_CAPSULE | Freq: Two times a day (BID) | ORAL | 2 refills | Status: AC

## 2024-04-19 MED ORDER — EMPAGLIFLOZIN 25 MG PO TABS: 25.0000 mg | ORAL_TABLET | Freq: Every day | ORAL | 5 refills | Status: AC

## 2024-04-19 MED ORDER — INSULIN GLARGINE 100 UNIT/ML SOLOSTAR PEN: 35.0000 [IU] | PEN_INJECTOR | Freq: Every day | SUBCUTANEOUS | Status: AC

## 2024-04-19 NOTE — Progress Notes (Signed)
 [facility] [address] [place], [place] [address]  Internal MEDICINE  Office Visit Note  Patient Name: Kara [name]  [id number]  [id number]  Date of Service: 04/19/2024  Chief Complaint  Patient presents with   Diabetes   Hypertension   Hyperlipidemia   Follow-up    HPI Kara [name] presents for a follow-up visit for hypertension, chronic pain, diabetes and refills.  Hypertension -- controlled with current medications.  Spinal stenosis and chronic back pain -- takes pregabalin  twice daily.  Diabetic neuropathy -- on pregabalin .  Type 2 diabetes -- on ozempic  via PAP program but will not be eligible next year. Her insurance will cover ozempic  but unsure of what the copay is in January.  High cholesterol -- on statin therapy.     Current Medication: Outpatient Encounter Medications as of 04/19/2024  Medication Sig Note   aspirin  EC 81 MG tablet Take 81 mg by mouth daily. Swallow whole.    acetaminophen  (TYLENOL ) 325 MG tablet Take 2 tablets (650 mg total) by mouth 3 (three) times daily.    Calcium -Magnesium -Vitamin D  (CALCIUM  1200+D3 PO) Take 1 tablet by mouth daily.    Collagen-Boron-Hyaluronic Acid (MOVE FREE ULTRA JOINT HEALTH) 40-5-3.3 MG TABS Take 1 tablet by mouth daily.    doxycycline  (MONODOX ) 100 MG capsule TAKE 1 CAPSULE BY MOUTH TWICE DAILY WITH FOOD    empagliflozin  (JARDIANCE ) 25 MG TABS tablet Take 1 tablet (25 mg total) by mouth daily.    Ferrous Gluconate  324 (37.5 Fe) MG TABS Take 324 mg by mouth daily.    gentamicin  cream (GARAMYCIN ) 0.1 % Apply to ulcers once daily.    hydrocortisone  2.5 % cream APPLY TO AFFECTED AREAS BUTTOCKS ONCE TO TWICE DAILY UNTIL IMPROVED.    insulin  aspart (NOVOLOG  FLEXPEN) 100 UNIT/ML FlexPen INJECT UNDER THE SKIN THREE TIMES DAILY WITH MEALS AS DIRECTED PER SLIDING SCALE. MAX DAILY DOSE IS 30 UNITS    insulin  glargine (LANTUS ) 100 UNIT/ML Solostar Pen Inject 35 Units into the skin daily.    ketoconazole  (NIZORAL ) 2 % cream  Apply to affected area buttocks twice daily until improved.    Multiple Vitamin (MULTIVITAMIN) tablet Take 1 tablet by mouth daily at 12 noon.    pantoprazole  (PROTONIX ) 40 MG tablet Take 1 tablet (40 mg total) by mouth daily. TAKE 1 TABLET(40 MG) BY MOUTH DAILY    pregabalin  (LYRICA ) 100 MG capsule Take 1 capsule (100 mg total) by mouth 2 (two) times daily.    rosuvastatin  (CRESTOR ) 20 MG tablet Take 1 tablet (20 mg total) by mouth daily.    Semaglutide , 1 MG/DOSE, 4 MG/3ML SOPN Inject 1 mg as directed once a week.    torsemide  (DEMADEX ) 20 MG tablet TAKE 1 TABLET(20 MG) BY MOUTH TWICE DAILY    triamcinolone  (KENALOG ) 0.025 % cream Apply 1 Application topically 2 (two) times daily as needed (itching). To groin area    [DISCONTINUED] Aflibercept  (EYLEA ) 2 MG/0.05ML SOLN  (Patient not taking: Reported on 04/19/2024) 12/22/2021: Every 3 months   [DISCONTINUED] insulin  glargine (LANTUS ) 100 UNIT/ML Solostar Pen Inject 40 Units into the skin daily.    [DISCONTINUED] JARDIANCE  25 MG TABS tablet TAKE 1 TABLET(25 MG) BY MOUTH DAILY BEFORE BREAKFAST    [DISCONTINUED] Krill Oil 500 MG CAPS Take 1 capsule by mouth daily. (Patient not taking: Reported on 04/19/2024)    [DISCONTINUED] Lactobacillus (PROBIOTIC ACIDOPHILUS) CAPS Take 1 capsule by mouth daily.    [DISCONTINUED] pregabalin  (LYRICA ) 100 MG capsule TAKE 1 CAPSULE(100 MG) BY MOUTH TWICE  DAILY    [DISCONTINUED] rosuvastatin  (CRESTOR ) 20 MG tablet TAKE 1 TABLET(20 MG) BY MOUTH EVERY DAY AT BEDTIME    [DISCONTINUED] tamoxifen  (NOLVADEX ) 20 MG tablet Take 1 tablet (20 mg total) by mouth daily.    No facility-administered encounter medications on file as of 04/19/2024.    Surgical History: Past Surgical History:  Procedure Laterality Date   AMPUTATION TOE Right 02/26/2023   Procedure: SECOND DIGIT AMPUTATION;  Surgeon: Silva Juliene SAUNDERS, DPM;  Location: [facility] OR[facility];  Service: Orthopedics/Podiatry;  Laterality: Right;   ANTERIOR LATERAL LUMBAR FUSION WITH  PERCUTANEOUS SCREW 2 LEVEL N/A 07/03/2022   Procedure: L3-5 LATERAL LUMBAR INTERBODY FUSION WITH POSTERIOR SPINAL FUSION;  Surgeon: Clois Fret, MD;  Location: [facility];  Service: Neurosurgery;  Laterality: N/A;   APPLICATION OF INTRAOPERATIVE CT SCAN N/A 07/03/2022   Procedure: APPLICATION OF INTRAOPERATIVE CT SCAN;  Surgeon: Clois Fret, MD;  Location: [facility];  Service: Neurosurgery;  Laterality: N/A;   BILATERAL SALPINGECTOMY     BREAST BIOPSY Right 2014   neg- core   BREAST BIOPSY Right 12/21/2018   US  bx, venus clip,  DUCTAL CARCINOMA IN SITU   BREAST BIOPSY Right 12/21/2018   US  bx, ribbon clip,  FIBROEPITHELIAL PROLIFERATION WITH SCLEROSIS   BREAST BIOPSY Right 01/01/2019   Affirm bx X clip-path pending   BREAST DUCTAL SYSTEM EXCISION     BREAST EXCISIONAL BIOPSY Right 2014   neg   BREAST LUMPECTOMY Right 01/22/2019   path pending   CATARACT EXTRACTION     CHOLECYSTECTOMY     COLONOSCOPY WITH PROPOFOL  N/A 06/25/2023   Procedure: COLONOSCOPY WITH PROPOFOL ;  Surgeon: Therisa Bi, MD;  Location: [facility] ENDOSCOPY;  Service: Gastroenterology;  Laterality: N/A;   CYSTOSCOPY WITH STENT PLACEMENT Left 04/14/2015   Procedure: CYSTOSCOPY WITH STENT PLACEMENT;  Surgeon: Rosina Riis, MD;  Location: [facility];  Service: Urology;  Laterality: Left;   EXTRACORPOREAL SHOCK WAVE LITHOTRIPSY Left 09/29/2014   Procedure: EXTRACORPOREAL SHOCK WAVE LITHOTRIPSY (ESWL);  Surgeon: Rosina Riis, MD;  Location: [facility];  Service: Urology;  Laterality: Left;   EYE SURGERY     bilateral cataract   HEMOSTASIS CLIP PLACEMENT  06/25/2023   Procedure: CONTROL OF HEMORRHAGE, GI TRACT, ENDOSCOPIC, BY CLIPPING OR OVERSEWING;  Surgeon: Therisa Bi, MD;  Location: [facility] ENDOSCOPY;  Service: Gastroenterology;;   IRRIGATION AND DEBRIDEMENT ABSCESS Right 03/24/2019   Procedure: IRRIGATION AND DEBRIDEMENT ABSCESS RIGHT BREAST;  Surgeon: Rodolph Romano, MD;  Location: [facility];  Service: General;   Laterality: Right;   LAPAROSCOPIC OOPHERECTOMY Left    PARTIAL MASTECTOMY WITH NEEDLE LOCALIZATION Right 01/22/2019   Procedure: PARTIAL MASTECTOMY WITH NEEDLE LOCALIZATION;  Surgeon: Rodolph Romano, MD;  Location: [facility];  Service: General;  Laterality: Right;   POLYPECTOMY  06/25/2023   Procedure: POLYPECTOMY;  Surgeon: Therisa Bi, MD;  Location: [facility] ENDOSCOPY;  Service: Gastroenterology;;   TONSILLECTOMY     TUBAL LIGATION     URETEROSCOPY WITH HOLMIUM LASER LITHOTRIPSY Left 04/14/2015   Procedure: URETEROSCOPY WITH HOLMIUM LASER LITHOTRIPSY;  Surgeon: Rosina Riis, MD;  Location: [facility];  Service: Urology;  Laterality: Left;    Medical History: Past Medical History:  Diagnosis Date   Anemia    Arthropathy    Benign neoplasm of breast    Breast cancer (HCC)    DCIS   Chest pain    CKD (chronic kidney disease), stage IV (HCC)    Diabetes (HCC)    Diabetic retinopathy (HCC)    Dyspnea    Edema  History of kidney stones    Hyperlipemia    Hypersomnia with sleep apnea    Hypertension    Inflammatory and toxic neuropathy    Lumbago    Neuropathy    Osteoarthrosis    Osteoporosis    Ovarian failure    Personal history of radiation therapy    Pneumonia due to COVID-19 virus 12/21/2021   a.) required inpatient admission   SOB (shortness of breath)     Family History: Family History  Problem Relation Age of Onset   Breast cancer Maternal Grandmother 73   Colon cancer Mother    Lung cancer Father     Social History   Socioeconomic History   Marital status: Married    Spouse name: Not on file   Number of children: Not on file   Years of education: Not on file   Highest education level: Not on file  Occupational History   Not on file  Tobacco Use   Smoking status: Former    Current packs/day: 0.00    Average packs/day: 1 pack/day for 30.0 years (30.0 ttl pk-yrs)    Types: Cigarettes    Start date: 04/10/1965    Quit date: 04/11/1995    Years  since quitting: 29.0   Smokeless tobacco: Never  Vaping Use   Vaping status: Never Used  Substance and Sexual Activity   Alcohol use: No    Alcohol/week: 0.0 standard drinks of alcohol   Drug use: No   Sexual activity: Not on file  Other Topics Concern   Not on file  Social History Narrative   Not on file   Social Drivers of Health   Tobacco Use: Medium Risk (04/19/2024)   Patient History    Smoking Tobacco Use: Former    Smokeless Tobacco Use: Never    Passive Exposure: Not on Actuary Strain: Not on file  Food Insecurity: No Food Insecurity (02/24/2023)   Hunger Vital Sign    Worried About Running Out of Food in the Last Year: Never true    Ran Out of Food in the Last Year: Never true  Transportation Needs: No Transportation Needs (02/24/2023)   PRAPARE - Administrator, Civil Service (Medical): No    Lack of Transportation (Non-Medical): No  Physical Activity: Not on file  Stress: Not on file  Social Connections: Not on file  Intimate Partner Violence: Not At Risk (02/24/2023)   Humiliation, Afraid, Rape, and Kick questionnaire    Fear of Current or Ex-Partner: No    Emotionally Abused: No    Physically Abused: No    Sexually Abused: No  Depression (PHQ2-9): Low Risk (10/21/2023)   Depression (PHQ2-9)    PHQ-2 Score: 0  Alcohol Screen: Low Risk (01/31/2022)   Alcohol Screen    Last Alcohol Screening Score (AUDIT): 0  Housing: Low Risk (02/24/2023)   Housing    Last Housing Risk Score: 0  Utilities: Not At Risk (02/24/2023)   AHC Utilities    Threatened with loss of utilities: No  Health Literacy: Not on file      Review of Systems  Constitutional:  Positive for fatigue. Negative for chills and unexpected weight change.  HENT:  Negative for congestion, rhinorrhea, sneezing and sore throat.   Eyes:  Negative for redness.  Respiratory: Negative.  Negative for cough, chest tightness, shortness of breath and wheezing.   Cardiovascular:  Negative.  Negative for chest pain and palpitations.  Gastrointestinal: Negative.  Negative for abdominal  pain, constipation, diarrhea, nausea and vomiting.  Genitourinary:  Negative for dysuria and frequency.  Musculoskeletal:  Positive for arthralgias and gait problem (using cane). Negative for back pain, joint swelling and neck pain.  Skin:  Negative for rash.  Neurological:  Negative for tremors and numbness.  Hematological:  Negative for adenopathy. Does not bruise/bleed easily.  Psychiatric/Behavioral:  Negative for behavioral problems (Depression), sleep disturbance and suicidal ideas. The patient is not nervous/anxious.     Vital Signs: BP 138/60 (BP Location: Left Arm, Patient Position: Sitting, Cuff Size: Normal) Comment: 148/68  Pulse 80   Temp (!) 96.6 F (35.9 C)   Resp 16   Ht 5' (1.524 m)   Wt 180 lb 9.6 oz (81.9 kg)   SpO2 95%   BMI 35.27 kg/m    Physical Exam Vitals reviewed.  Constitutional:      General: She is not in acute distress.    Appearance: Normal appearance. She is obese. She is not ill-appearing.  HENT:     Head: Normocephalic and atraumatic.  Eyes:     Pupils: Pupils are equal, round, and reactive to light.  Cardiovascular:     Rate and Rhythm: Normal rate and regular rhythm.  Pulmonary:     Effort: Pulmonary effort is normal. No respiratory distress.  Neurological:     Mental Status: She is alert and oriented to person, place, and time.  Psychiatric:        Mood and Affect: Mood normal.        Behavior: Behavior normal.        Assessment/Plan: 1. Type 2 diabetes mellitus with stage 3b chronic kidney disease, with long-term current use of insulin  (HCC) (Primary) Continue medications as prescribed.  - empagliflozin  (JARDIANCE ) 25 MG TABS tablet; Take 1 tablet (25 mg total) by mouth daily.  Dispense: 30 tablet; Refill: 5 - insulin  glargine (LANTUS ) 100 UNIT/ML Solostar Pen; Inject 35 Units into the skin daily.  2. Diabetic  polyneuropathy associated with type 2 diabetes mellitus (HCC) Continue pregabalin  as prescribed.  - pregabalin  (LYRICA ) 100 MG capsule; Take 1 capsule (100 mg total) by mouth 2 (two) times daily.  Dispense: 60 capsule; Refill: 2  3. Atherosclerosis of aorta Continue rosuvastatin  as prescribed  - rosuvastatin  (CRESTOR ) 20 MG tablet; Take 1 tablet (20 mg total) by mouth daily.  Dispense: 90 tablet; Refill: 3  4. Central stenosis of spinal canal Continue pregabalin  as prescribed  - pregabalin  (LYRICA ) 100 MG capsule; Take 1 capsule (100 mg total) by mouth 2 (two) times daily.  Dispense: 60 capsule; Refill: 2   General Counseling: Carita verbalizes understanding of the findings of todays visit and agrees with plan of treatment. I have discussed any further diagnostic evaluation that may be needed or ordered today. We also reviewed her medications today. she has been encouraged to call the office with any questions or concerns that should arise related to todays visit.    No orders of the defined types were placed in this encounter.   Meds ordered this encounter  Medications   empagliflozin  (JARDIANCE ) 25 MG TABS tablet    Sig: Take 1 tablet (25 mg total) by mouth daily.    Dispense:  30 tablet    Refill:  5    ZERO refills remain on this prescription. Your patient is requesting advance approval of refills for this medication to PREVENT ANY MISSED DOSES   pregabalin  (LYRICA ) 100 MG capsule    Sig: Take 1 capsule (100 mg total) by mouth  2 (two) times daily.    Dispense:  60 capsule    Refill:  2    For future refills.   rosuvastatin  (CRESTOR ) 20 MG tablet    Sig: Take 1 tablet (20 mg total) by mouth daily.    Dispense:  90 tablet    Refill:  3    ZERO refills remain on this prescription. Your patient is requesting advance approval of refills for this medication to PREVENT ANY MISSED DOSES   insulin  glargine (LANTUS ) 100 UNIT/ML Solostar Pen    Sig: Inject 35 Units into the skin daily.     Dx code E11.65; fill for 1 month should be 5 pens    Return in about 3 months (around 07/13/2024) for F/U, pain med refill, Layden Caterino PCP and need release for signed for [facility] for diabetic eye exam.   Total time spent:30 Minutes Time spent includes review of chart, medications, test results, and follow up plan with the patient.   [place] Controlled Substance Database was reviewed by me.  This patient was seen by Mardy Maxin, FNP-C in collaboration with Dr. Sigrid Bathe as a part of collaborative care agreement.   Keali Mccraw R. Maxin, MSN, FNP-C Internal medicine

## 2024-04-19 NOTE — Telephone Encounter (Signed)
 Request for last diabetic eye exam faxed to [facility]: [phone]

## 2024-05-08 ENCOUNTER — Other Ambulatory Visit: Payer: Self-pay | Admitting: Nurse Practitioner

## 2024-05-08 DIAGNOSIS — E1142 Type 2 diabetes mellitus with diabetic polyneuropathy: Secondary | ICD-10-CM

## 2024-05-08 DIAGNOSIS — M4800 Spinal stenosis, site unspecified: Secondary | ICD-10-CM

## 2024-05-10 ENCOUNTER — Telehealth: Payer: Self-pay | Admitting: Nurse Practitioner

## 2024-05-10 NOTE — Telephone Encounter (Signed)
 Faxed 2nd request to [facility] for last diabetic eye exam-Toni

## 2024-07-08 ENCOUNTER — Ambulatory Visit: Admitting: Podiatry

## 2024-07-12 ENCOUNTER — Ambulatory Visit: Admitting: Nurse Practitioner

## 2024-10-21 ENCOUNTER — Ambulatory Visit: Admitting: Nurse Practitioner
